# Patient Record
Sex: Female | Born: 1950 | Race: Black or African American | Hispanic: No | Marital: Single | Smoking: Current some day smoker
Health system: Southern US, Community
[De-identification: ages and names within clinical notes are randomized; demographics above are authoritative.]

## PROBLEM LIST (undated history)

## (undated) DIAGNOSIS — E785 Hyperlipidemia, unspecified: Secondary | ICD-10-CM

## (undated) DIAGNOSIS — M545 Low back pain, unspecified: Secondary | ICD-10-CM

## (undated) DIAGNOSIS — N289 Disorder of kidney and ureter, unspecified: Secondary | ICD-10-CM

## (undated) DIAGNOSIS — J449 Chronic obstructive pulmonary disease, unspecified: Secondary | ICD-10-CM

## (undated) DIAGNOSIS — Z85038 Personal history of other malignant neoplasm of large intestine: Secondary | ICD-10-CM

## (undated) DIAGNOSIS — I255 Ischemic cardiomyopathy: Secondary | ICD-10-CM

## (undated) DIAGNOSIS — D649 Anemia, unspecified: Secondary | ICD-10-CM

## (undated) DIAGNOSIS — I10 Essential (primary) hypertension: Secondary | ICD-10-CM

## (undated) DIAGNOSIS — E669 Obesity, unspecified: Secondary | ICD-10-CM

## (undated) DIAGNOSIS — I214 Non-ST elevation (NSTEMI) myocardial infarction: Secondary | ICD-10-CM

## (undated) HISTORY — DX: Disorder of kidney and ureter, unspecified: N28.9

## (undated) HISTORY — DX: Essential (primary) hypertension: I10

## (undated) HISTORY — DX: Chronic obstructive pulmonary disease, unspecified: J44.9

## (undated) HISTORY — PX: COLECTOMY: SHX59

## (undated) HISTORY — DX: Personal history of other malignant neoplasm of large intestine: Z85.038

## (undated) HISTORY — DX: Hyperlipidemia, unspecified: E78.5

## (undated) HISTORY — DX: Low back pain: M54.5

## (undated) HISTORY — DX: Low back pain, unspecified: M54.50

## (undated) HISTORY — DX: Obesity, unspecified: E66.9

## (undated) HISTORY — DX: Anemia, unspecified: D64.9

---

## 1998-11-11 ENCOUNTER — Other Ambulatory Visit: Admission: RE | Admit: 1998-11-11 | Discharge: 1998-11-11 | Payer: Self-pay | Admitting: Internal Medicine

## 1999-10-06 ENCOUNTER — Other Ambulatory Visit: Admission: RE | Admit: 1999-10-06 | Discharge: 1999-10-06 | Payer: Self-pay | Admitting: Internal Medicine

## 2004-05-26 ENCOUNTER — Ambulatory Visit: Payer: Self-pay | Admitting: Internal Medicine

## 2004-08-18 ENCOUNTER — Ambulatory Visit: Payer: Self-pay | Admitting: Internal Medicine

## 2004-08-26 ENCOUNTER — Ambulatory Visit: Payer: Self-pay | Admitting: Internal Medicine

## 2004-11-17 ENCOUNTER — Ambulatory Visit: Payer: Self-pay | Admitting: Internal Medicine

## 2004-11-24 ENCOUNTER — Ambulatory Visit: Payer: Self-pay | Admitting: Internal Medicine

## 2005-02-25 ENCOUNTER — Ambulatory Visit: Payer: Self-pay | Admitting: Internal Medicine

## 2005-03-04 ENCOUNTER — Ambulatory Visit: Payer: Self-pay | Admitting: Internal Medicine

## 2005-05-05 ENCOUNTER — Ambulatory Visit: Payer: Self-pay | Admitting: Internal Medicine

## 2005-06-04 ENCOUNTER — Ambulatory Visit: Payer: Self-pay | Admitting: Internal Medicine

## 2007-10-13 ENCOUNTER — Ambulatory Visit: Payer: Self-pay | Admitting: Internal Medicine

## 2007-10-13 DIAGNOSIS — I10 Essential (primary) hypertension: Secondary | ICD-10-CM | POA: Insufficient documentation

## 2007-10-13 DIAGNOSIS — F17200 Nicotine dependence, unspecified, uncomplicated: Secondary | ICD-10-CM | POA: Insufficient documentation

## 2007-10-13 DIAGNOSIS — E1159 Type 2 diabetes mellitus with other circulatory complications: Secondary | ICD-10-CM | POA: Insufficient documentation

## 2007-10-13 DIAGNOSIS — R634 Abnormal weight loss: Secondary | ICD-10-CM | POA: Insufficient documentation

## 2007-10-13 DIAGNOSIS — Z85038 Personal history of other malignant neoplasm of large intestine: Secondary | ICD-10-CM | POA: Insufficient documentation

## 2007-10-13 DIAGNOSIS — D509 Iron deficiency anemia, unspecified: Secondary | ICD-10-CM | POA: Insufficient documentation

## 2007-10-17 LAB — CONVERTED CEMR LAB
ALT: 16 units/L (ref 0–35)
AST: 14 units/L (ref 0–37)
Albumin: 3.8 g/dL (ref 3.5–5.2)
Alkaline Phosphatase: 155 units/L — ABNORMAL HIGH (ref 39–117)
BUN: 11 mg/dL (ref 6–23)
Basophils Absolute: 0.1 10*3/uL (ref 0.0–0.1)
Basophils Relative: 1.6 % — ABNORMAL HIGH (ref 0.0–1.0)
Bilirubin Urine: NEGATIVE
Bilirubin, Direct: 0.1 mg/dL (ref 0.0–0.3)
CEA: 4.5 ng/mL (ref 0.0–5.0)
CO2: 30 meq/L (ref 19–32)
Calcium: 9.2 mg/dL (ref 8.4–10.5)
Chloride: 101 meq/L (ref 96–112)
Cholesterol: 185 mg/dL (ref 0–200)
Creatinine, Ser: 0.5 mg/dL (ref 0.4–1.2)
Creatinine,U: 84.2 mg/dL
Eosinophils Absolute: 0.1 10*3/uL (ref 0.0–0.7)
Eosinophils Relative: 1.9 % (ref 0.0–5.0)
GFR calc Af Amer: 164 mL/min
GFR calc non Af Amer: 135 mL/min
Glucose, Bld: 356 mg/dL — ABNORMAL HIGH (ref 70–99)
HCT: 43 % (ref 36.0–46.0)
HDL: 53.4 mg/dL (ref >39.0)
Hemoglobin: 14 g/dL (ref 12.0–15.0)
Hgb A1c MFr Bld: 14.7 % — ABNORMAL HIGH (ref 4.6–6.0)
Iron: 57 ug/dL (ref 42–145)
Ketones, ur: NEGATIVE mg/dL
LDL Cholesterol: 119 mg/dL — ABNORMAL HIGH (ref 0–99)
Leukocytes, UA: NEGATIVE
Lymphocytes Relative: 37.1 % (ref 12.0–46.0)
MCHC: 32.6 g/dL (ref 30.0–36.0)
MCV: 85.7 fL (ref 78.0–100.0)
Microalb Creat Ratio: 165.1 mg/g — ABNORMAL HIGH (ref 0.0–30.0)
Microalb, Ur: 13.9 mg/dL — ABNORMAL HIGH (ref 0.0–1.9)
Monocytes Absolute: 0.3 10*3/uL (ref 0.1–1.0)
Monocytes Relative: 8.6 % (ref 3.0–12.0)
Neutro Abs: 1.9 10*3/uL (ref 1.4–7.7)
Neutrophils Relative %: 50.8 % (ref 43.0–77.0)
Nitrite: NEGATIVE
Platelets: 134 10*3/uL — ABNORMAL LOW (ref 150–400)
Potassium: 3.9 meq/L (ref 3.5–5.1)
RBC: 5.02 M/uL (ref 3.87–5.11)
RDW: 13.7 % (ref 11.5–14.6)
Saturation Ratios: 16.5 % — ABNORMAL LOW (ref 20.0–50.0)
Sodium: 137 meq/L (ref 135–145)
Specific Gravity, Urine: 1.025 (ref 1.000–1.03)
TSH: 1.91 microintl units/mL (ref 0.35–5.50)
Total Bilirubin: 0.7 mg/dL (ref 0.3–1.2)
Total CHOL/HDL Ratio: 3.5
Total Protein: 6.9 g/dL (ref 6.0–8.3)
Transferrin: 247.2 mg/dL (ref >=212.0)
Triglycerides: 62 mg/dL (ref 0–149)
Urine Glucose: >=1000 mg/dL — CR
Urobilinogen, UA: 0.2 (ref 0.0–1.0)
VLDL: 12 mg/dL (ref 0–40)
Vitamin B-12: 701 pg/mL (ref 211–911)
WBC: 3.8 10*3/uL — ABNORMAL LOW (ref 4.5–10.5)
pH: 5.5 (ref 5.0–8.0)

## 2008-01-31 ENCOUNTER — Ambulatory Visit: Payer: Self-pay | Admitting: Internal Medicine

## 2008-02-01 LAB — CONVERTED CEMR LAB
BUN: 14 mg/dL (ref 6–23)
CO2: 28 meq/L (ref 19–32)
Calcium: 9.3 mg/dL (ref 8.4–10.5)
Chloride: 102 meq/L (ref 96–112)
Creatinine, Ser: 0.6 mg/dL (ref 0.4–1.2)
GFR calc Af Amer: 133 mL/min
GFR calc non Af Amer: 110 mL/min
Glucose, Bld: 346 mg/dL — ABNORMAL HIGH (ref 70–99)
Hgb A1c MFr Bld: 12.2 % — ABNORMAL HIGH (ref 4.6–6.0)
Potassium: 3.8 meq/L (ref 3.5–5.1)
Sodium: 137 meq/L (ref 135–145)

## 2008-02-09 ENCOUNTER — Ambulatory Visit: Payer: Self-pay | Admitting: Internal Medicine

## 2008-02-09 DIAGNOSIS — J449 Chronic obstructive pulmonary disease, unspecified: Secondary | ICD-10-CM | POA: Insufficient documentation

## 2008-06-06 ENCOUNTER — Ambulatory Visit: Payer: Self-pay | Admitting: Internal Medicine

## 2008-06-06 LAB — CONVERTED CEMR LAB
BUN: 16 mg/dL (ref 6–23)
CO2: 27 meq/L (ref 19–32)
Calcium: 9.3 mg/dL (ref 8.4–10.5)
Chloride: 104 meq/L (ref 96–112)
Creatinine, Ser: 0.6 mg/dL (ref 0.4–1.2)
GFR calc Af Amer: 133 mL/min
GFR calc non Af Amer: 110 mL/min
Glucose, Bld: 270 mg/dL — ABNORMAL HIGH (ref 70–99)
Hgb A1c MFr Bld: 10.7 % — ABNORMAL HIGH (ref 4.6–6.0)
Potassium: 3.7 meq/L (ref 3.5–5.1)
Sodium: 138 meq/L (ref 135–145)
TSH: 2.1 microintl units/mL (ref 0.35–5.50)

## 2008-06-13 ENCOUNTER — Ambulatory Visit: Payer: Self-pay | Admitting: Internal Medicine

## 2008-06-13 DIAGNOSIS — J019 Acute sinusitis, unspecified: Secondary | ICD-10-CM | POA: Insufficient documentation

## 2008-09-06 ENCOUNTER — Ambulatory Visit: Payer: Self-pay | Admitting: Internal Medicine

## 2008-09-06 LAB — CONVERTED CEMR LAB
ALT: 16 units/L (ref 0–35)
AST: 13 units/L (ref 0–37)
Albumin: 3.9 g/dL (ref 3.5–5.2)
Alkaline Phosphatase: 75 units/L (ref 39–117)
BUN: 20 mg/dL (ref 6–23)
Basophils Absolute: 0.1 10*3/uL (ref 0.0–0.1)
Basophils Relative: 0.8 % (ref 0.0–3.0)
Bilirubin, Direct: 0.1 mg/dL (ref 0.0–0.3)
CO2: 28 meq/L (ref 19–32)
Calcium: 9.8 mg/dL (ref 8.4–10.5)
Chloride: 103 meq/L (ref 96–112)
Cholesterol: 119 mg/dL (ref 0–200)
Creatinine, Ser: 0.6 mg/dL (ref 0.4–1.2)
Eosinophils Absolute: 0.2 10*3/uL (ref 0.0–0.7)
Eosinophils Relative: 3.2 % (ref 0.0–5.0)
GFR calc Af Amer: 133 mL/min
GFR calc non Af Amer: 110 mL/min
Glucose, Bld: 232 mg/dL — ABNORMAL HIGH (ref 70–99)
HCT: 37 % (ref 36.0–46.0)
HDL: 36 mg/dL — ABNORMAL LOW (ref >39.0)
Hemoglobin: 12.4 g/dL (ref 12.0–15.0)
Hgb A1c MFr Bld: 11.4 % — ABNORMAL HIGH (ref 4.6–6.0)
LDL Cholesterol: 70 mg/dL (ref 0–99)
Lymphocytes Relative: 32.2 % (ref 12.0–46.0)
MCHC: 33.6 g/dL (ref 30.0–36.0)
MCV: 86.5 fL (ref 78.0–100.0)
Monocytes Absolute: 0.5 10*3/uL (ref 0.1–1.0)
Monocytes Relative: 7.9 % (ref 3.0–12.0)
Neutro Abs: 3.5 10*3/uL (ref 1.4–7.7)
Neutrophils Relative %: 55.9 % (ref 43.0–77.0)
Platelets: 141 10*3/uL — ABNORMAL LOW (ref 150–400)
Potassium: 3.7 meq/L (ref 3.5–5.1)
RBC: 4.27 M/uL (ref 3.87–5.11)
RDW: 13.2 % (ref 11.5–14.6)
Sodium: 138 meq/L (ref 135–145)
Total Bilirubin: 0.8 mg/dL (ref 0.3–1.2)
Total CHOL/HDL Ratio: 3.3
Total Protein: 6.6 g/dL (ref 6.0–8.3)
Triglycerides: 67 mg/dL (ref 0–149)
VLDL: 13 mg/dL (ref 0–40)
WBC: 6.4 10*3/uL (ref 4.5–10.5)

## 2008-09-11 ENCOUNTER — Ambulatory Visit: Payer: Self-pay | Admitting: Internal Medicine

## 2008-09-11 DIAGNOSIS — M79609 Pain in unspecified limb: Secondary | ICD-10-CM | POA: Insufficient documentation

## 2008-10-29 ENCOUNTER — Telehealth: Payer: Self-pay | Admitting: Internal Medicine

## 2008-12-13 ENCOUNTER — Ambulatory Visit: Payer: Self-pay | Admitting: Internal Medicine

## 2008-12-15 LAB — CONVERTED CEMR LAB
BUN: 24 mg/dL — ABNORMAL HIGH (ref 6–23)
CO2: 24 meq/L (ref 19–32)
Calcium: 9.6 mg/dL (ref 8.4–10.5)
Chloride: 108 meq/L (ref 96–112)
Creatinine, Ser: 1.4 mg/dL — ABNORMAL HIGH (ref 0.4–1.2)
GFR calc non Af Amer: 41.02 mL/min (ref >60)
Glucose, Bld: 100 mg/dL — ABNORMAL HIGH (ref 70–99)
Hgb A1c MFr Bld: 7.9 % — ABNORMAL HIGH (ref 4.6–6.5)
Potassium: 4 meq/L (ref 3.5–5.1)
Sodium: 140 meq/L (ref 135–145)
Total CK: 132 units/L (ref 7–177)
Vitamin B-12: 434 pg/mL (ref 211–911)

## 2008-12-19 ENCOUNTER — Ambulatory Visit: Payer: Self-pay | Admitting: Internal Medicine

## 2008-12-19 DIAGNOSIS — N259 Disorder resulting from impaired renal tubular function, unspecified: Secondary | ICD-10-CM | POA: Insufficient documentation

## 2009-04-15 ENCOUNTER — Ambulatory Visit: Payer: Self-pay | Admitting: Internal Medicine

## 2009-04-17 LAB — CONVERTED CEMR LAB
ALT: 16 units/L (ref 0–35)
AST: 15 units/L (ref 0–37)
Albumin: 3.9 g/dL (ref 3.5–5.2)
Alkaline Phosphatase: 86 units/L (ref 39–117)
BUN: 24 mg/dL — ABNORMAL HIGH (ref 6–23)
Basophils Absolute: 0.2 10*3/uL — ABNORMAL HIGH (ref 0.0–0.1)
Basophils Relative: 3.4 % — ABNORMAL HIGH (ref 0.0–3.0)
Bilirubin, Direct: 0 mg/dL (ref 0.0–0.3)
CEA: 2.3 ng/mL (ref 0.0–5.0)
CO2: 27 meq/L (ref 19–32)
Calcium: 9.4 mg/dL (ref 8.4–10.5)
Chloride: 104 meq/L (ref 96–112)
Cholesterol: 112 mg/dL (ref 0–200)
Creatinine, Ser: 0.9 mg/dL (ref 0.4–1.2)
Eosinophils Absolute: 0.1 10*3/uL (ref 0.0–0.7)
Eosinophils Relative: 1.6 % (ref 0.0–5.0)
GFR calc non Af Amer: 68.23 mL/min (ref >60)
Glucose, Bld: 102 mg/dL — ABNORMAL HIGH (ref 70–99)
HCT: 33.2 % — ABNORMAL LOW (ref 36.0–46.0)
HDL: 43.2 mg/dL (ref >39.00)
Hemoglobin: 11.2 g/dL — ABNORMAL LOW (ref 12.0–15.0)
Hgb A1c MFr Bld: 8 % — ABNORMAL HIGH (ref 4.6–6.5)
LDL Cholesterol: 59 mg/dL (ref 0–99)
Lymphocytes Relative: 24.2 % (ref 12.0–46.0)
Lymphs Abs: 1.6 10*3/uL (ref 0.7–4.0)
MCHC: 33.9 g/dL (ref 30.0–36.0)
MCV: 86.8 fL (ref 78.0–100.0)
Monocytes Absolute: 0.3 10*3/uL (ref 0.1–1.0)
Monocytes Relative: 4.3 % (ref 3.0–12.0)
Neutro Abs: 4.3 10*3/uL (ref 1.4–7.7)
Neutrophils Relative %: 66.5 % (ref 43.0–77.0)
Platelets: 144 10*3/uL — ABNORMAL LOW (ref 150.0–400.0)
Potassium: 3.8 meq/L (ref 3.5–5.1)
RBC: 3.82 M/uL — ABNORMAL LOW (ref 3.87–5.11)
RDW: 13.3 % (ref 11.5–14.6)
Sodium: 138 meq/L (ref 135–145)
TSH: 3.26 microintl units/mL (ref 0.35–5.50)
Total Bilirubin: 0.4 mg/dL (ref 0.3–1.2)
Total CHOL/HDL Ratio: 3
Total Protein: 6.7 g/dL (ref 6.0–8.3)
Triglycerides: 49 mg/dL (ref 0.0–149.0)
VLDL: 9.8 mg/dL (ref 0.0–40.0)
Vitamin B-12: 344 pg/mL (ref 211–911)
WBC: 6.5 10*3/uL (ref 4.5–10.5)

## 2009-04-18 ENCOUNTER — Ambulatory Visit: Payer: Self-pay | Admitting: Internal Medicine

## 2009-08-22 ENCOUNTER — Ambulatory Visit: Payer: Self-pay | Admitting: Internal Medicine

## 2009-08-23 LAB — CONVERTED CEMR LAB
BUN: 24 mg/dL — ABNORMAL HIGH (ref 6–23)
CO2: 25 meq/L (ref 19–32)
Calcium: 9.1 mg/dL (ref 8.4–10.5)
Chloride: 108 meq/L (ref 96–112)
Creatinine, Ser: 1 mg/dL (ref 0.4–1.2)
GFR calc non Af Amer: 60.34 mL/min (ref >60)
Glucose, Bld: 125 mg/dL — ABNORMAL HIGH (ref 70–99)
Hgb A1c MFr Bld: 8.2 % — ABNORMAL HIGH (ref 4.6–6.5)
Potassium: 4 meq/L (ref 3.5–5.1)
Sodium: 139 meq/L (ref 135–145)

## 2009-08-26 ENCOUNTER — Ambulatory Visit: Payer: Self-pay | Admitting: Internal Medicine

## 2009-12-31 ENCOUNTER — Ambulatory Visit: Payer: Self-pay | Admitting: Internal Medicine

## 2009-12-31 LAB — CONVERTED CEMR LAB
ALT: 15 units/L (ref 0–35)
AST: 12 units/L (ref 0–37)
Albumin: 3.9 g/dL (ref 3.5–5.2)
Alkaline Phosphatase: 96 units/L (ref 39–117)
BUN: 24 mg/dL — ABNORMAL HIGH (ref 6–23)
Basophils Absolute: 0 10*3/uL (ref 0.0–0.1)
Basophils Relative: 0.5 % (ref 0.0–3.0)
Bilirubin Urine: NEGATIVE
Bilirubin, Direct: 0.1 mg/dL (ref 0.0–0.3)
CEA: 2.5 ng/mL (ref 0.0–5.0)
CO2: 25 meq/L (ref 19–32)
Calcium: 9.3 mg/dL (ref 8.4–10.5)
Chloride: 109 meq/L (ref 96–112)
Cholesterol: 157 mg/dL (ref 0–200)
Creatinine, Ser: 0.9 mg/dL (ref 0.4–1.2)
Eosinophils Absolute: 0.1 10*3/uL (ref 0.0–0.7)
Eosinophils Relative: 1.7 % (ref 0.0–5.0)
GFR calc non Af Amer: 65.53 mL/min (ref >60)
Glucose, Bld: 118 mg/dL — ABNORMAL HIGH (ref 70–99)
HCT: 32.1 % — ABNORMAL LOW (ref 36.0–46.0)
HDL: 44.7 mg/dL (ref >39.00)
Hemoglobin: 10.8 g/dL — ABNORMAL LOW (ref 12.0–15.0)
Hgb A1c MFr Bld: 8.5 % — ABNORMAL HIGH (ref 4.6–6.5)
Ketones, ur: NEGATIVE mg/dL
LDL Cholesterol: 95 mg/dL (ref 0–99)
Leukocytes, UA: NEGATIVE
Lymphocytes Relative: 30.9 % (ref 12.0–46.0)
Lymphs Abs: 1.9 10*3/uL (ref 0.7–4.0)
MCHC: 33.7 g/dL (ref 30.0–36.0)
MCV: 86.2 fL (ref 78.0–100.0)
Monocytes Absolute: 0.6 10*3/uL (ref 0.1–1.0)
Monocytes Relative: 10.3 % (ref 3.0–12.0)
Neutro Abs: 3.5 10*3/uL (ref 1.4–7.7)
Neutrophils Relative %: 56.6 % (ref 43.0–77.0)
Nitrite: NEGATIVE
Platelets: 148 10*3/uL — ABNORMAL LOW (ref 150.0–400.0)
Potassium: 4.2 meq/L (ref 3.5–5.1)
RBC: 3.72 M/uL — ABNORMAL LOW (ref 3.87–5.11)
RDW: 14.3 % (ref 11.5–14.6)
Sodium: 140 meq/L (ref 135–145)
Specific Gravity, Urine: 1.015 (ref 1.000–1.030)
TSH: 2.74 microintl units/mL (ref 0.35–5.50)
Total Bilirubin: 0.4 mg/dL (ref 0.3–1.2)
Total CHOL/HDL Ratio: 4
Total Protein, Urine: NEGATIVE mg/dL
Total Protein: 6.9 g/dL (ref 6.0–8.3)
Triglycerides: 89 mg/dL (ref 0.0–149.0)
Urine Glucose: NEGATIVE mg/dL
Urobilinogen, UA: 0.2 (ref 0.0–1.0)
VLDL: 17.8 mg/dL (ref 0.0–40.0)
Vitamin B-12: 330 pg/mL (ref 211–911)
WBC: 6.2 10*3/uL (ref 4.5–10.5)
pH: 5.5 (ref 5.0–8.0)

## 2010-01-07 ENCOUNTER — Ambulatory Visit: Payer: Self-pay | Admitting: Internal Medicine

## 2010-01-08 ENCOUNTER — Telehealth: Payer: Self-pay | Admitting: Internal Medicine

## 2010-01-16 ENCOUNTER — Encounter (INDEPENDENT_AMBULATORY_CARE_PROVIDER_SITE_OTHER): Payer: Self-pay | Admitting: *Deleted

## 2010-05-06 ENCOUNTER — Ambulatory Visit: Payer: Self-pay | Admitting: Internal Medicine

## 2010-05-06 LAB — CONVERTED CEMR LAB: Vit D, 25-Hydroxy: 33 ng/mL (ref 30–89)

## 2010-05-07 LAB — CONVERTED CEMR LAB
ALT: 14 units/L (ref 0–35)
AST: 14 units/L (ref 0–37)
Albumin: 3.6 g/dL (ref 3.5–5.2)
Alkaline Phosphatase: 95 units/L (ref 39–117)
BUN: 18 mg/dL (ref 6–23)
Basophils Absolute: 0 10*3/uL (ref 0.0–0.1)
Basophils Relative: 0.3 % (ref 0.0–3.0)
Bilirubin, Direct: 0.1 mg/dL (ref 0.0–0.3)
CO2: 25 meq/L (ref 19–32)
Calcium: 9.3 mg/dL (ref 8.4–10.5)
Chloride: 108 meq/L (ref 96–112)
Cholesterol: 154 mg/dL (ref 0–200)
Creatinine, Ser: 0.8 mg/dL (ref 0.4–1.2)
Eosinophils Absolute: 0.1 10*3/uL (ref 0.0–0.7)
Eosinophils Relative: 1.5 % (ref 0.0–5.0)
GFR calc non Af Amer: 76.77 mL/min (ref >60)
Glucose, Bld: 119 mg/dL — ABNORMAL HIGH (ref 70–99)
HCT: 30.8 % — ABNORMAL LOW (ref 36.0–46.0)
HDL: 45.9 mg/dL (ref >39.00)
Hemoglobin: 10.4 g/dL — ABNORMAL LOW (ref 12.0–15.0)
Hgb A1c MFr Bld: 8.2 % — ABNORMAL HIGH (ref 4.6–6.5)
Iron: 34 ug/dL — ABNORMAL LOW (ref 42–145)
LDL Cholesterol: 97 mg/dL (ref 0–99)
Lymphocytes Relative: 21 % (ref 12.0–46.0)
Lymphs Abs: 1.1 10*3/uL (ref 0.7–4.0)
MCHC: 33.8 g/dL (ref 30.0–36.0)
MCV: 85.4 fL (ref 78.0–100.0)
Monocytes Absolute: 0.5 10*3/uL (ref 0.1–1.0)
Monocytes Relative: 9.2 % (ref 3.0–12.0)
Neutro Abs: 3.5 10*3/uL (ref 1.4–7.7)
Neutrophils Relative %: 68 % (ref 43.0–77.0)
Platelets: 137 10*3/uL — ABNORMAL LOW (ref 150.0–400.0)
Potassium: 3.8 meq/L (ref 3.5–5.1)
RBC: 3.61 M/uL — ABNORMAL LOW (ref 3.87–5.11)
RDW: 15.1 % — ABNORMAL HIGH (ref 11.5–14.6)
Saturation Ratios: 9.1 % — ABNORMAL LOW (ref 20.0–50.0)
Sodium: 140 meq/L (ref 135–145)
Total Bilirubin: 0.7 mg/dL (ref 0.3–1.2)
Total CHOL/HDL Ratio: 3
Total Protein: 6.4 g/dL (ref 6.0–8.3)
Transferrin: 265.7 mg/dL (ref 212.0–360.0)
Triglycerides: 57 mg/dL (ref 0.0–149.0)
VLDL: 11.4 mg/dL (ref 0.0–40.0)
Vitamin B-12: 775 pg/mL (ref 211–911)
WBC: 5.2 10*3/uL (ref 4.5–10.5)

## 2010-05-21 ENCOUNTER — Ambulatory Visit: Payer: Self-pay | Admitting: Internal Medicine

## 2010-07-19 DIAGNOSIS — E785 Hyperlipidemia, unspecified: Secondary | ICD-10-CM | POA: Insufficient documentation

## 2010-07-19 DIAGNOSIS — E669 Obesity, unspecified: Secondary | ICD-10-CM | POA: Insufficient documentation

## 2010-07-19 DIAGNOSIS — Z85038 Personal history of other malignant neoplasm of large intestine: Secondary | ICD-10-CM | POA: Insufficient documentation

## 2010-08-07 NOTE — Assessment & Plan Note (Signed)
Summary: 4 mos well with labs/#/cd   Vital Signs:  Patient profile:   60 year old female Height:      62 inches (157.48 cm) Weight:      224 pounds (101.82 kg) BMI:     41.12 Pulse rate:   72 / minute Pulse rhythm:   regular Resp:     16 per minute BP sitting:   120 / 80  (left arm) Cuff size:   large  Vitals Entered By: Jonathon Resides, CMA(AAMA) (January 07, 2010 8:33 AM) CC: cpx   CC:  cpx.  History of Present Illness: The patient presents for a follow up of hypertension, diabetes, hyperlipidemia The patient presents for a wellness examination    Current Medications (verified): 1)  Amaryl 4 Mg  Tabs (Glimepiride) .... Once Daily 2)  Lasix 80 Mg  Tabs (Furosemide) .... 1/2 Once Daily 3)  Simvastatin 20 Mg Tabs (Simvastatin) .[name].. 1 By Mouth At Bedtime 4)  Vitamin D3 1000 Unit  Tabs (Cholecalciferol) .[name].. 1 By Mouth Daily 5)  Spironolactone 50 Mg  Tabs (Spironolactone) .[name].. 1 By Mouth Qd 6)  Proair Hfa 108 (90 Base) Mcg/act  Aers (Albuterol Sulfate) .... 2 Inh Q4h As Needed Shortness of Breath 7)  Exforge 5-320 Mg  Tabs (Amlodipine Besylate-Valsartan) .[name].. 1 By Mouth Qd 8)  Janumet 50-1000 Mg Tabs (Sitagliptin-Metformin Hcl) .[name].. 1 By Mouth Bid 9)  Nexium 40 Mg Cpdr (Esomeprazole Magnesium) .... Take 1 Tab Each Morning 10)  Maxichlor Peh 4-10 Mg Tabs (Chlorpheniramine-Phenylephrine) .[name].. 1 By Mouth Two Times A Day Prn  Allergies (verified): 1)  ! Darvocet A500 2)  ! Lipitor (Atorvastatin Calcium) 3)  Hydrochlorothiazide 4)  Enalapril Maleate 5)  Zocor  Past History:  Past Medical History: Last updated: 12/19/2008 Anemia-iron deficiency Colon cancer, hx of Diabetes mellitus, type II Hyperlipidemia Hypertension Low back pain Obesity COPD Renal insufficiency  Past Surgical History: Last updated: 10/13/2007 Colectomy  Family History: Last updated: 10/13/2007 Family History Hypertension  Social History: Last updated: 01/07/2010 Occupation: CNA working a  lot Single widow - has a friend Current Smoker  Social History: Occupation: CNA working a lot Single widow - has a friend Current Smoker  Review of Systems       The patient complains of difficulty walking.  The patient denies weight loss, weight gain, dyspnea on exertion, abdominal pain, melena, hematochezia, severe indigestion/heartburn, anorexia, fever, vision loss, decreased hearing, hoarseness, chest pain, syncope, peripheral edema, prolonged cough, headaches, hemoptysis, hematuria, incontinence, genital sores, muscle weakness, suspicious skin lesions, transient blindness, depression, unusual weight change, abnormal bleeding, enlarged lymph nodes, angioedema, and breast masses.         LBP Lower extr swelling  Physical Exam  General:  overweight-appearing.   Head:  Normocephalic and atraumatic without obvious abnormalities. No apparent alopecia or balding. Eyes:  No corneal or conjunctival inflammation noted. EOMI. Perrla. Ears:  External ear exam shows no significant lesions or deformities.  Otoscopic examination reveals clear canals, tympanic membranes are intact bilaterally without bulging, retraction, inflammation or discharge. Hearing is grossly normal bilaterally. Nose:  Swollen mucosa Mouth:  Moist Neck:  No deformities, masses, or tenderness noted. Chest Wall:  No deformities, masses, or tenderness noted. Lungs:  Normal respiratory effort, chest expands symmetrically. Lungs are clear to auscultation, no crackles or wheezes. Heart:  Normal rate and regular rhythm. S1 and S2 normal without gallop, murmur, click, rub or other extra sounds. Abdomen:  Bowel sounds positive,abdomen soft and non-tender without masses, organomegaly or  hernias noted. Msk:  w/o OA deformities Extremities:  trace left pedal edema.   Neurologic:  No cranial nerve deficits noted. Station and gait are normal. Plantar reflexes are down-going bilaterally. DTRs are symmetrical throughout. Sensory, motor  and coordinative functions appear intact. Skin:  Intact without suspicious lesions or rashes Cervical Nodes:  No lymphadenopathy noted Inguinal Nodes:  No significant adenopathy Psych:  Cognition and judgment appear intact. Alert and cooperative with normal attention span and concentration. No apparent delusions, illusions, hallucinations   Impression & Recommendations:  Problem # 1:  PHYSICAL EXAMINATION (ICD-V70.0) Assessment New The labs were reviewed with the patient.  Health and age related issues were discussed. Available screening tests and vaccinations were discussed as well. Healthy life style including good diet and execise was discussed.  Orders: EKG w/ Interpretation (93000)  Problem # 2:  RENAL INSUFFICIENCY (ICD-588.9) Assessment: New The labs were reviewed with the patient.   Problem # 3:  HYPERTENSION (ICD-401.9) Assessment: Unchanged  Her updated medication list for this problem includes:    Lasix 80 Mg Tabs (Furosemide) .[name]... 1/2 once daily prn    Spironolactone 50 Mg Tabs (Spironolactone) .[name]... 1 by mouth qd    Exforge 5-320 Mg Tabs (Amlodipine besylate-valsartan) .[name]... 1 by mouth qd  BP today: 120/80 Prior BP: 126/80 (08/26/2009)  Labs Reviewed: K+: 4.2 (12/31/2009) Creat: : 0.9 (12/31/2009)   Chol: 157 (12/31/2009)   HDL: 44.70 (12/31/2009)   LDL: 95 (12/31/2009)   TG: 89.0 (12/31/2009)  Problem # 4:  DIABETES MELLITUS, TYPE II (ICD-250.00) Assessment: Unchanged  Her updated medication list for this problem includes:    Amaryl 4 Mg Tabs (Glimepiride) ..... Once daily    Exforge 5-320 Mg Tabs (Amlodipine besylate-valsartan) .[name]... 1 by mouth qd    Janumet 50-1000 Mg Tabs (Sitagliptin-metformin hcl) .[name]... 1 by mouth bid  Labs Reviewed: Creat: 0.9 (12/31/2009)    Reviewed HgBA1c results: 8.5 (12/31/2009)  8.2 (08/22/2009)  Problem # 5:  HYPERLIPIDEMIA (ICD-272.4)  The following medications were removed from the medication list:    Simvastatin 20 Mg  Tabs (Simvastatin) .[name]... 1 by mouth at bedtime  Problem # 6:  ANEMIA-IRON DEFICIENCY (ICD-280.9) Assessment: Deteriorated  Her updated medication list for this problem includes:    Vitamin B-12 500 Mcg Tabs (Cyanocobalamin) .[name]... 1 by mouth once daily for vitamin b12 deficiency    Ferro-bob 325 (65 Fe) Mg Tabs (Ferrous sulfate) .[name]... 1 by mouth once daily for anemia  Problem # 7:  COLON CANCER, HX OF (ICD-V10.05) Assessment: Comment Only  Will check w/Dr Henrene Pastor re colon due or not. She missed two colonoscopies  Orders: Gastroenterology Referral (GI)  Complete Medication List: 1)  Amaryl 4 Mg Tabs (Glimepiride) .... Once daily 2)  Lasix 80 Mg Tabs (Furosemide) .... 1/2 once daily prn 3)  Vitamin D3 1000 Unit Tabs (Cholecalciferol) .[name].. 1 by mouth daily 4)  Spironolactone 50 Mg Tabs (Spironolactone) .[name].. 1 by mouth qd 5)  Proair Hfa 108 (90 Base) Mcg/act Aers (Albuterol sulfate) .... 2 inh q4h as needed shortness of breath 6)  Exforge 5-320 Mg Tabs (Amlodipine besylate-valsartan) .[name].. 1 by mouth qd 7)  Janumet 50-1000 Mg Tabs (Sitagliptin-metformin hcl) .[name].. 1 by mouth bid 8)  Nexium 40 Mg Cpdr (Esomeprazole magnesium) .... Take 1 tab each morning 9)  Maxichlor Peh 4-10 Mg Tabs (Chlorpheniramine-phenylephrine) .[name].. 1 by mouth two times a day prn 10)  Vitamin B-12 500 Mcg Tabs (Cyanocobalamin) .[name].. 1 by mouth once daily for vitamin b12 deficiency 11)  Ferro-bob 325 (65 Fe) Mg Tabs (Ferrous sulfate) .[name].. 1 by mouth once daily for anemia  Patient Instructions: 1)  Please schedule a follow-up appointment in 4 months. 2)  BMP prior to visit, ICD-9: 3)  Hepatic Panel prior to visit, ICD-9: 4)  Lipid Panel prior to visit, ICD-9: 5)  CBC w/ Diff prior to visit, ICD-9: 6)  HbgA1C prior to visit, ICD-9: 7)  iron and TIBC 8)  Vit B12 and Vit D 268.9 995.20 Prescriptions: NEXIUM 40 MG CPDR (ESOMEPRAZOLE MAGNESIUM) Take 1 tab each morning  #30 x 12   Entered and Authorized by:   [name] MD   Signed by:   [name] MD on 01/07/2010   Method used:   Electronically to        [facility]. [id number]* (retail)       [address].       [place], [place]  [address]       Ph: [phone] or [phone]       Fax: [phone]   RxID:   [id number] JANUMET 50-1000 MG TABS (SITAGLIPTIN-METFORMIN HCL) 1 by mouth bid  #60 Tablet x 12   Entered and Authorized by:   [name] MD   Signed by:   [name] MD on 01/07/2010   Method used:   Electronically to        [facility]. [id number]* (retail)       [place].       [place], [place]  [address]       Ph: [phone] or [phone]       Fax: [phone]   RxID:   [id number] LASIX 80 MG  TABS (FUROSEMIDE) 1/2 once daily  #30 x 12   Entered and Authorized by:   [name] MD   Signed by:   [name] MD on 01/07/2010   Method used:   Electronically to        [facility]. [id number]* (retail)       [place].       [place], [place]  [address]       Ph: [phone] or [phone]       Fax: [phone]   RxID:   [id number] EXFORGE 5-320 MG  TABS (AMLODIPINE BESYLATE-VALSARTAN) 1 by mouth qd  #30 Tablet x 12   Entered and Authorized by:   [name] MD   Signed by:   [name] MD on 01/07/2010   Method used:   Electronically to        [facility]. [id number]* (retail)       [place].       [place], [place]  [address]       Ph: [phone] or [phone]       Fax: [phone]   RxID:   [id number] SPIRONOLACTONE 50 MG  TABS (SPIRONOLACTONE) 1 by mouth qd  #30 Tablet x 12   Entered and Authorized by:   [name] MD   Signed by:   [name] MD on 01/07/2010   Method used:   Electronically to        [facility]. [id number]* (retail)       [place].  [place], [place]  [address]       Ph:  [phone] or [phone]       Fax: [phone]   RxID:   [id number] AMARYL 4 MG  TABS (GLIMEPIRIDE) once daily  #30 Tablet x 12   Entered and Authorized by:   [name] MD   Signed by:   [name] MD on 01/07/2010   Method used:   Electronically to        [facility]. [id number]* (retail)       [place].       [place], [place]  [address]       Ph: [phone] or [phone]       Fax: [phone]   RxID:   [id number] FERRO-BOB 325 (65 FE) MG TABS (FERROUS SULFATE) 1 by mouth once daily for anemia  #30 x 6   Entered and Authorized by:   [name] MD   Signed by:   [name] MD on 01/07/2010   Method used:   Electronically to        [facility]. [id number]* (retail)       [place].       [place], [place]  [address]       Ph: [phone] or [phone]       Fax: [phone]   RxID:   [id number] VITAMIN B-12 500 MCG TABS (CYANOCOBALAMIN) 1 by mouth once daily for Vitamin B12 deficiency  #30 x 12   Entered and Authorized by:   [name] MD   Signed by:   [name] MD on 01/07/2010   Method used:   Electronically to        [facility]. [id number]* (retail)       [place].       [place], [place]  [address]       Ph: [phone] or [phone]       Fax: [phone]   RxID:   [id number]

## 2010-08-07 NOTE — Letter (Signed)
Summary: Referral - not able to see patient  [facility]  [place], [place] [address]   Phone: [phone]  Fax: [phone]    January 16, 2010    Lew Dawes, M.D. [address] [place], [place] [address]   Re:   [name] DOB:  1950-11-10 MRN:   [id number]    Dear Dr. Alain Marion:  Thank you for your kind referral of the above patient.  We have attempted to schedule the recommended procedure Screening Colonoscopy but have not been able to schedule because:  ___ The patient was not available by phone and/or has not returned our calls.   X  The patient declined to schedule the procedure at this time.  We appreciate the referral and hope that we will have the opportunity to treat this patient in the future.    Sincerely,    [facility] [phone]

## 2010-08-07 NOTE — Assessment & Plan Note (Signed)
Summary: 3 mos f/u $50 / cd   Vital Signs:  Patient profile:   60 year old female Height:      62 inches Weight:      216 pounds BMI:     39.65 Temp:     98.5 degrees F oral Pulse rate:   72 / minute BP sitting:   136 / 72  (left arm)  Vitals Entered By: Doralee Albino (December 19, 2008 10:20 AM) CC: f/u Is Patient Diabetic? Yes   CC:  f/u.  History of Present Illness: The patient presents for a follow up of hypertension, diabetes, hyperlipidemia   Current Medications (verified): 1)  Amaryl 4 Mg  Tabs (Glimepiride) .... Once Daily 2)  Lasix 80 Mg  Tabs (Furosemide) .... 1/2 Once Daily 3)  Simvastatin 20 Mg Tabs (Simvastatin) .[name].. 1 By Mouth At Bedtime 4)  Vitamin D3 1000 Unit  Tabs (Cholecalciferol) .[name].. 1 By Mouth Daily 5)  Spironolactone 50 Mg  Tabs (Spironolactone) .[name].. 1 By Mouth Qd 6)  Proair Hfa 108 (90 Base) Mcg/act  Aers (Albuterol Sulfate) .... 2 Inh Q4h As Needed Shortness of Breath 7)  Exforge 5-320 Mg  Tabs (Amlodipine Besylate-Valsartan) .[name].. 1 By Mouth Qd 8)  Janumet 50-1000 Mg Tabs (Sitagliptin-Metformin Hcl) .[name].. 1 By Mouth Bid  Allergies: 1)  ! Darvocet A500 (Propoxyphene N-Apap) 2)  ! Lipitor (Atorvastatin Calcium) 3)  Hydrochlorothiazide 4)  Enalapril Maleate  Past History:  Past Surgical History: Last updated: 10/13/2007 Colectomy  Family History: Last updated: 10/13/2007 Family History Hypertension  Social History: Last updated: 02/09/2008 Occupation: CNA Single widow Current Smoker  Past Medical History: Anemia-iron deficiency Colon cancer, hx of Diabetes mellitus, type II Hyperlipidemia Hypertension Low back pain Obesity COPD Renal insufficiency  Physical Exam  General:  overweight-appearing.   Nose:  Erythematous throat mucosa and intranasal erythema.  Mouth:  Oral mucosa and oropharynx without lesions or exudates.  Dentures. Neck:  No deformities, masses, or tenderness noted. Lungs:  Normal respiratory effort, chest  expands symmetrically. Lungs are clear to auscultation, no crackles or wheezes. Heart:  Normal rate and regular rhythm. S1 and S2 normal without gallop, murmur, click, rub or other extra sounds. Abdomen:  Bowel sounds positive,abdomen soft and non-tender without masses, organomegaly or hernias noted. Msk:  B ancerina tender  Neurologic:  No cranial nerve deficits noted. Station and gait are normal. Plantar reflexes are down-going bilaterally. DTRs are symmetrical throughout. Sensory, motor and coordinative functions appear intact. Skin:  Intact without suspicious lesions or rashes Psych:  Cognition and judgment appear intact. Alert and cooperative with normal attention span and concentration. No apparent delusions, illusions, hallucinations   Impression & Recommendations:  Problem # 1:  WEIGHT LOSS, ABNORMAL (ICD-783.21) on diet Assessment Comment Only  Problem # 2:  LOW BACK PAIN (ICD-724.2) Assessment: Unchanged On prescription therapy  Exercises given  Problem # 3:  HYPERTENSION (ICD-401.9) Assessment: Improved  Her updated medication list for this problem includes:    Lasix 80 Mg Tabs (Furosemide) .[name]... 1/2 once daily    Spironolactone 50 Mg Tabs (Spironolactone) .[name]... 1 by mouth qd    Exforge 5-320 Mg Tabs (Amlodipine besylate-valsartan) .[name]... 1 by mouth qd  Problem # 4:  DIABETES MELLITUS, TYPE II (ICD-250.00) Assessment: Improved  Her updated medication list for this problem includes:    Amaryl 4 Mg Tabs (Glimepiride) ..... Once daily    Exforge 5-320 Mg Tabs (Amlodipine besylate-valsartan) .[name]... 1 by mouth qd    Janumet 50-1000 Mg Tabs (Sitagliptin-metformin hcl) .Marland KitchenMarland KitchenMarland KitchenMarland Kitchen  1 by mouth bid  Labs Reviewed: Creat: 1.4 (12/13/2008)    Reviewed HgBA1c results: 7.9 (12/13/2008)  11.4 (09/06/2008)  Problem # 5:  RENAL INSUFFICIENCY (ICD-588.9) Assessment: Comment Only Repeat labs  Complete Medication List: 1)  Amaryl 4 Mg Tabs (Glimepiride) .... Once daily 2)  Lasix 80 Mg  Tabs (Furosemide) .... 1/2 once daily 3)  Simvastatin 20 Mg Tabs (Simvastatin) .[name].. 1 by mouth at bedtime 4)  Vitamin D3 1000 Unit Tabs (Cholecalciferol) .[name].. 1 by mouth daily 5)  Spironolactone 50 Mg Tabs (Spironolactone) .[name].. 1 by mouth qd 6)  Proair Hfa 108 (90 Base) Mcg/act Aers (Albuterol sulfate) .... 2 inh q4h as needed shortness of breath 7)  Exforge 5-320 Mg Tabs (Amlodipine besylate-valsartan) .[name].. 1 by mouth qd 8)  Janumet 50-1000 Mg Tabs (Sitagliptin-metformin hcl) .[name].. 1 by mouth bid 9)  Nexium 40 Mg Cpdr (Esomeprazole magnesium) .... Take 1 tab each morning  Patient Instructions: 1)  Please schedule a follow-up appointment in 4 months. 2)  BMP prior to visit, ICD-9: 3)  Hepatic Panel prior to visit, ICD-9: 4)  Lipid Panel prior to visit, ICD-9: 5)  TSH prior to visit, ICD-9: 6)  CBC w/ Diff prior to visit, ICD-9: 7)  HbgA1C prior to visit, ICD-9: 250.00 8)  CEA 9)  Vit B12 10)  [email or web address]

## 2010-08-07 NOTE — Assessment & Plan Note (Signed)
Summary: 4 MO ROV /NWS $50   Vital Signs:  Patient Profile:   60 Years Old Female Weight:      220 pounds Temp:     98 degrees F oral Pulse rate:   112 / minute BP sitting:   160 / 94  (left arm)  Vitals Entered By: Doralee Albino (June 13, 2008 8:09 AM)                 Chief Complaint:  Multiple medical problems or concerns.  History of Present Illness: The patient presents for a follow up of hypertension, diabetes, hyperlipidemia C/o L sinus infection x 2 wks    Current Allergies (reviewed today): ! DARVOCET A500 (PROPOXYPHENE N-APAP) ! LIPITOR (ATORVASTATIN CALCIUM) HYDROCHLOROTHIAZIDE ENALAPRIL MALEATE  Past Medical History:    Reviewed history from 02/09/2008 and no changes required:       Anemia-iron deficiency       Colon cancer, hx of       Diabetes mellitus, type II       Hyperlipidemia       Hypertension       Low back pain       Obesity       COPD   Family History:    Reviewed history from 10/13/2007 and no changes required:       Family History Hypertension  Social History:    Reviewed history from 02/09/2008 and no changes required:       Occupation: CNA       Single widow       Current Smoker     Physical Exam  General:     overweight-appearing.   Head:     Normocephalic and atraumatic without obvious abnormalities. No apparent alopecia or balding. Nose:     Erythematous throat mucosa and intranasal erythema.  Mouth:     Oral mucosa and oropharynx without lesions or exudates.  Dentures. Lungs:     Normal respiratory effort, chest expands symmetrically. Lungs are clear to auscultation, no crackles or wheezes. Heart:     Normal rate and regular rhythm. S1 and S2 normal without gallop, murmur, click, rub or other extra sounds. Abdomen:     Bowel sounds positive,abdomen soft and non-tender without masses, organomegaly or hernias noted. Msk:     Lumbar-sacral spine is tender to palpation over paraspinal muscles and painfull with  the ROM  Extremities:     No edema Neurologic:     No cranial nerve deficits noted. Station and gait are normal. Plantar reflexes are down-going bilaterally. DTRs are symmetrical throughout. Sensory, motor and coordinative functions appear intact. Skin:     Intact without suspicious lesions or rashes Psych:     Cognition and judgment appear intact. Alert and cooperative with normal attention span and concentration. No apparent delusions, illusions, hallucinations    Impression & Recommendations:  Problem # 1:  SINUSITIS, ACUTE (ICD-461.9) Assessment: New  Her updated medication list for this problem includes:    Zithromax Z-pak 250 Mg Tabs (Azithromycin) ..... Use as directed   Problem # 2:  HYPERTENSION (ICD-401.9) Assessment: Improved  The following medications were removed from the medication list:    Diovan 320 Mg Tabs (Valsartan) ..... Once daily  Her updated medication list for this problem includes:    Lasix 80 Mg Tabs (Furosemide) .[name]... 1/2 once daily    Spironolactone 50 Mg Tabs (Spironolactone) .[name]... 1 by mouth qd    Exforge 5-320 Mg Tabs (Amlodipine besylate-valsartan) .[name]... 1 by  mouth qd   Problem # 3:  DIABETES MELLITUS, TYPE II (ICD-250.00) Assessment: Improved REfusing insulin The following medications were removed from the medication list:    Diovan 320 Mg Tabs (Valsartan) ..... Once daily  Her updated medication list for this problem includes:    Amaryl 4 Mg Tabs (Glimepiride) ..... Once daily    Metformin Hcl 500 Mg Tabs (Metformin hcl) .[name]... Take 2 tabs twice daily   Problem # 4:  COLON CANCER, HX OF (ICD-V10.05) Assessment: Comment Only Get CBC  Problem # 5:  LOW BACK PAIN (ICD-724.2) Assessment: Unchanged  The following medications were removed from the medication list:    Vicodin 5-500 Mg Tabs (Hydrocodone-acetaminophen) .[name]... As needed    Ibuprofen 600 Mg Tabs (Ibuprofen) .[name]... Three times a day as needed   Complete Medication List: 1)   Amaryl 4 Mg Tabs (Glimepiride) .... Once daily 2)  Lasix 80 Mg Tabs (Furosemide) .... 1/2 once daily 3)  Simvastatin 20 Mg Tabs (Simvastatin) .[name].. 1 by mouth at bedtime 4)  Vitamin D3 1000 Unit Tabs (Cholecalciferol) .[name].. 1 by mouth daily 5)  Spironolactone 50 Mg Tabs (Spironolactone) .[name].. 1 by mouth qd 6)  Metformin Hcl 500 Mg Tabs (Metformin hcl) .... Take 2 tabs twice daily 7)  Proair Hfa 108 (90 Base) Mcg/act Aers (Albuterol sulfate) .... 2 inh q4h as needed shortness of breath 8)  Exforge 5-320 Mg Tabs (Amlodipine besylate-valsartan) .[name].. 1 by mouth qd 9)  Zithromax Z-pak 250 Mg Tabs (Azithromycin) .... Use as directed   Patient Instructions: 1)  Please schedule a follow-up appointment in 3 months. 2)  BMP prior to visit, ICD-9: 3)  Hepatic Panel prior to visit, ICD-9:250.02 4)  Lipid Panel prior to visit, ICD-9: 5)  HbgA1C prior to visit, ICD-9: 6)  CBC w/ Diff prior to visit, ICD-9:   Prescriptions: ZITHROMAX Z-PAK 250 MG  TABS (AZITHROMYCIN) Use as directed  #1 x 0   Entered and Authorized by:   [name] MD   Signed by:   [name] MD on 06/13/2008   Method used:   Print then Give to Patient   RxID:   [id number] EXFORGE 5-320 MG  TABS (AMLODIPINE BESYLATE-VALSARTAN) 1 by mouth qd  #30 x 12   Entered and Authorized by:   [name] MD   Signed by:   [name] MD on 06/13/2008   Method used:   Print then Give to Patient   RxID:   [id number] METFORMIN HCL 500 MG TABS (METFORMIN HCL) Take 2 tabs twice daily  #120 x 12   Entered and Authorized by:   [name] MD   Signed by:   [name] MD on 06/13/2008   Method used:   Print then Give to Patient   [id number]  ]

## 2010-08-07 NOTE — Progress Notes (Signed)
Summary: simvastatin  Phone Note Refill Request Message from:  Fax from Pharmacy on October 29, 2008 3:47 PM  Refills Requested: Medication #1:  SIMVASTATIN 20 MG TABS 1 by mouth at bedtime   Last Refilled: 10/13/2008 [facility] [address] [id number]   Method Requested: Electronic Initial call taken by: Tomma Lightning,  October 29, 2008 3:48 PM      Prescriptions: SIMVASTATIN 20 MG TABS (SIMVASTATIN) 1 by mouth at bedtime  #90 x 3   Entered by:   Tomma Lightning   Authorized by:   [name] MD   Signed by:   Tomma Lightning on 10/29/2008   Method used:   Electronically to        [facility]. [id number]* (retail)       [address]       [place], [place]  [address]       Ph: [phone] or [phone]       Fax: [phone]   RxID:   [id number]

## 2010-08-07 NOTE — Progress Notes (Signed)
Summary: ? re recall col   Phone Note From Other Clinic Call back at [phone]   Caller: Referral Coordinator Stanton Kidney Call For: Sharlett Iles Summary of Call: Dr Alain Marion wants to know when is patient due for colon. Initial call taken by: Ronalee Red,  January 08, 2010 4:05 PM  Follow-up for Phone Call        Chart requested for review.  Butch Penny Surface RN  January 09, 2010 8:14 AM  This pt sees Dr. Henrene Pastor.  Chart and message forwarded to [facility]. Follow-up by: Alberteen Spindle RN,  January 09, 2010 2:26 PM  Additional Follow-up for Phone Call Additional follow up Details #1::        [facility] informed that pt. had colon in 01 and was to have one the following year but did not. Pt. was r/s in 03 but did not keep appt. Additional Follow-up by: Abel Presto RN,  January 09, 2010 3:58 PM

## 2010-08-07 NOTE — Assessment & Plan Note (Signed)
Summary: 3 MO ROV /NWS $50   Vital Signs:  Patient profile:   60 year old female Weight:      219 pounds Temp:     97.4 degrees F oral Pulse rate:   104 / minute BP sitting:   134 / 92  (left arm)  History of Present Illness: The patient presents for a follow up of hypertension, diabetes, hyperlipidemia   Current Medications (verified): 1)  Amaryl 4 Mg  Tabs (Glimepiride) .... Once Daily 2)  Lasix 80 Mg  Tabs (Furosemide) .... 1/2 Once Daily 3)  Simvastatin 20 Mg Tabs (Simvastatin) .[name].. 1 By Mouth At Bedtime 4)  Vitamin D3 1000 Unit  Tabs (Cholecalciferol) .[name].. 1 By Mouth Daily 5)  Spironolactone 50 Mg  Tabs (Spironolactone) .[name].. 1 By Mouth Qd 6)  Metformin Hcl 500 Mg Tabs (Metformin Hcl) .... Take 2 Tabs Twice Daily 7)  Proair Hfa 108 (90 Base) Mcg/act  Aers (Albuterol Sulfate) .... 2 Inh Q4h As Needed Shortness of Breath 8)  Exforge 5-320 Mg  Tabs (Amlodipine Besylate-Valsartan) .[name].. 1 By Mouth Qd  Allergies (verified): 1)  ! Darvocet A500 (Propoxyphene N-Apap) 2)  ! Lipitor (Atorvastatin Calcium) 3)  Hydrochlorothiazide 4)  Enalapril Maleate  Past History  Past Medical History: Anemia-iron deficiency Colon cancer, hx of Diabetes mellitus, type II Hyperlipidemia Hypertension Low back pain Obesity COPD  (02/09/2008)  Past Surgical History: Colectomy  (10/13/2007)  Family History: Family History Hypertension  (10/13/2007)  Social History: Occupation: CNA Single widow Current Smoker  (02/09/2008)  Risk Factors: Alcohol Use: N/A >5 drinks/d w/in last 3 months: N/A Caffeine Use: N/A Diet: N/A Exercise: N/A  Physical Exam  General:  overweight-appearing.   Head:  Normocephalic and atraumatic without obvious abnormalities. No apparent alopecia or balding. Eyes:  glasses Mouth:  Oral mucosa and oropharynx without lesions or exudates.  Dentures. Lungs:  Normal respiratory effort, chest expands symmetrically. Lungs are clear to auscultation, no crackles  or wheezes. Heart:  Normal rate and regular rhythm. S1 and S2 normal without gallop, murmur, click, rub or other extra sounds. Abdomen:  Bowel sounds positive,abdomen soft and non-tender without masses, organomegaly or hernias noted. Msk:  B ancerina tender  Neurologic:  No cranial nerve deficits noted. Station and gait are normal. Plantar reflexes are down-going bilaterally. DTRs are symmetrical throughout. Sensory, motor and coordinative functions appear intact. Skin:  Intact without suspicious lesions or rashes Psych:  Cognition and judgment appear intact. Alert and cooperative with normal attention span and concentration. No apparent delusions, illusions, hallucinations   Impression & Recommendations:  Problem # 1:  HYPERTENSION (ICD-401.9) Assessment Unchanged  Her updated medication list for this problem includes:    Lasix 80 Mg Tabs (Furosemide) .[name]... 1/2 once daily    Spironolactone 50 Mg Tabs (Spironolactone) .[name]... 1 by mouth qd    Exforge 5-320 Mg Tabs (Amlodipine besylate-valsartan) .[name]... 1 by mouth qd  Problem # 2:  DIABETES MELLITUS, TYPE II (ICD-250.00) Assessment: Deteriorated Refusing insulin The following medications were removed from the medication list:    Metformin Hcl 500 Mg Tabs (Metformin hcl) .[name]... Take 2 tabs twice daily Her updated medication list for this problem includes:    Amaryl 4 Mg Tabs (Glimepiride) ..... Once daily    Exforge 5-320 Mg Tabs (Amlodipine besylate-valsartan) .[name]... 1 by mouth qd    Janumet 50-1000 Mg Tabs (Sitagliptin-metformin hcl) .[name]... 1 by mouth bid  Problem # 3:  HYPERLIPIDEMIA (B2193296.4) Assessment: Improved  Her updated medication list for this problem includes:  Simvastatin 20 Mg Tabs (Simvastatin) .[name]... 1 by mouth at bedtime  Problem # 4:  LEG PAIN, BILATERAL (ICD-729.5) B ancerina bursitis Assessment: [place] Treat #2  Complete Medication List: 1)  Amaryl 4 Mg Tabs (Glimepiride) .... Once daily 2)  Lasix 80 Mg Tabs  (Furosemide) .... 1/2 once daily 3)  Simvastatin 20 Mg Tabs (Simvastatin) .[name].. 1 by mouth at bedtime 4)  Vitamin D3 1000 Unit Tabs (Cholecalciferol) .[name].. 1 by mouth daily 5)  Spironolactone 50 Mg Tabs (Spironolactone) .[name].. 1 by mouth qd 6)  Proair Hfa 108 (90 Base) Mcg/act Aers (Albuterol sulfate) .... 2 inh q4h as needed shortness of breath 7)  Exforge 5-320 Mg Tabs (Amlodipine besylate-valsartan) .[name].. 1 by mouth qd 8)  Janumet 50-1000 Mg Tabs (Sitagliptin-metformin hcl) .[name].. 1 by mouth bid   Patient Instructions: 1)  Please schedule a follow-up appointment in 3 months. 2)  BMP prior to visit, ICD-9: 3)  HbgA1C prior to visit, ICD-9: 4)  Vit B12  729.5 250.02 5)  CK 6)  The medication list was reviewed and reconciled.  All changed / newly prescribed medications were explained.  A complete medication list was provided to the patient / caregiver.   Prescriptions: AMARYL 4 MG  TABS (GLIMEPIRIDE) once daily  #30 x 12   Entered and Authorized by:   [name] MD   Signed by:   [name] MD on 09/11/2008   Method used:   Print then Give to Patient   RxID:   [id number] JANUMET 50-1000 MG TABS (SITAGLIPTIN-METFORMIN HCL) 1 by mouth bid  #60 x 12   Entered and Authorized by:   [name] MD   Signed by:   [name] MD on 09/11/2008   Method used:   Print then Give to Patient   RxID:   [id number]

## 2010-08-07 NOTE — Assessment & Plan Note (Signed)
Summary: 4 MTH FU  $50  STC   Vital Signs:  Patient profile:   60 year old female Weight:      219 pounds Temp:     97.4 degrees F oral Pulse rate:   97 / minute BP sitting:   150 / 80  (left arm)  Vitals Entered By: Doralee Albino (April 18, 2009 10:03 AM) CC: f/u Is Patient Diabetic? Yes   CC:  f/u.  History of Present Illness: The patient presents for a follow up of hypertension, diabetes, hyperlipidemia, LBP   Preventive Screening-Counseling & Management  Alcohol-Tobacco     Smoking Status: current  Current Medications (verified): 1)  Amaryl 4 Mg  Tabs (Glimepiride) .... Once Daily 2)  Lasix 80 Mg  Tabs (Furosemide) .... 1/2 Once Daily 3)  Simvastatin 20 Mg Tabs (Simvastatin) .[name].. 1 By Mouth At Bedtime 4)  Vitamin D3 1000 Unit  Tabs (Cholecalciferol) .[name].. 1 By Mouth Daily 5)  Spironolactone 50 Mg  Tabs (Spironolactone) .[name].. 1 By Mouth Qd 6)  Proair Hfa 108 (90 Base) Mcg/act  Aers (Albuterol Sulfate) .... 2 Inh Q4h As Needed Shortness of Breath 7)  Exforge 5-320 Mg  Tabs (Amlodipine Besylate-Valsartan) .[name].. 1 By Mouth Qd 8)  Janumet 50-1000 Mg Tabs (Sitagliptin-Metformin Hcl) .[name].. 1 By Mouth Bid 9)  Nexium 40 Mg Cpdr (Esomeprazole Magnesium) .... Take 1 Tab Each Morning  Allergies: 1)  ! Darvocet A500 (Propoxyphene N-Apap) 2)  ! Lipitor (Atorvastatin Calcium) 3)  Hydrochlorothiazide 4)  Enalapril Maleate  Past History:  Past Medical History: Last updated: 12/19/2008 Anemia-iron deficiency Colon cancer, hx of Diabetes mellitus, type II Hyperlipidemia Hypertension Low back pain Obesity COPD Renal insufficiency  Social History: Last updated: 02/09/2008 Occupation: CNA Single widow Current Smoker  Physical Exam  General:  overweight-appearing.   Mouth:  Erythematous throat mucosa and intranasal erythema.  Neck:  No deformities, masses, or tenderness noted. Lungs:  Normal respiratory effort, chest expands symmetrically. Lungs are clear to  auscultation, no crackles or wheezes. Heart:  Normal rate and regular rhythm. S1 and S2 normal without gallop, murmur, click, rub or other extra sounds. Abdomen:  Bowel sounds positive,abdomen soft and non-tender without masses, organomegaly or hernias noted. Msk:  B ancerina tender  Extremities:  trace left pedal edema.   Neurologic:  No cranial nerve deficits noted. Station and gait are normal. Plantar reflexes are down-going bilaterally. DTRs are symmetrical throughout. Sensory, motor and coordinative functions appear intact. Skin:  Intact without suspicious lesions or rashes Psych:  Cognition and judgment appear intact. Alert and cooperative with normal attention span and concentration. No apparent delusions, illusions, hallucinations   Impression & Recommendations:  Problem # 1:  HYPERTENSION (ICD-401.9) Assessment Comment Only  Her updated medication list for this problem includes:    Lasix 80 Mg Tabs (Furosemide) .[name]... 1/2 once daily    Spironolactone 50 Mg Tabs (Spironolactone) .[name]... 1 by mouth qd    Exforge 5-320 Mg Tabs (Amlodipine besylate-valsartan) .[name]... 1 by mouth qd  BP today: 150/80 Prior BP: 136/72 (12/19/2008)  Labs Reviewed: K+: 3.8 (04/15/2009) Creat: : 0.9 (04/15/2009)   Chol: 112 (04/15/2009)   HDL: 43.20 (04/15/2009)   LDL: 59 (04/15/2009)   TG: 49.0 (04/15/2009)  Problem # 2:  LOW BACK PAIN (ICD-724.2) Assessment: Comment Only See "Patient Instructions".  Problem # 3:  COLON CANCER, HX OF (ICD-V10.05) Assessment: Comment Only The labs were reviewed with the patient.   Problem # 4:  ANEMIA-IRON DEFICIENCY (ICD-280.9) Assessment: Unchanged  Problem #  5:  DIABETES MELLITUS, TYPE II (ICD-250.00) Assessment: Unchanged  Her updated medication list for this problem includes:    Amaryl 4 Mg Tabs (Glimepiride) ..... Once daily    Exforge 5-320 Mg Tabs (Amlodipine besylate-valsartan) .[name]... 1 by mouth qd    Janumet 50-1000 Mg Tabs (Sitagliptin-metformin hcl)  .[name]... 1 by mouth bid  Problem # 6:  SINUSITIS, ACUTE (ICD-461.9) Assessment: New  Her updated medication list for this problem includes:    Zithromax Z-pak 250 Mg Tabs (Azithromycin) ..... Use as directed    Maxichlor Peh 4-10 Mg Tabs (Chlorpheniramine-phenylephrine) .[name]... 1 by mouth two times a day prn    Ceftin 500 Mg Tabs (Cefuroxime axetil) .[name]... 1 by mouth two times a day if not better  Complete Medication List: 1)  Amaryl 4 Mg Tabs (Glimepiride) .... Once daily 2)  Lasix 80 Mg Tabs (Furosemide) .... 1/2 once daily 3)  Simvastatin 20 Mg Tabs (Simvastatin) .[name].. 1 by mouth at bedtime 4)  Vitamin D3 1000 Unit Tabs (Cholecalciferol) .[name].. 1 by mouth daily 5)  Spironolactone 50 Mg Tabs (Spironolactone) .[name].. 1 by mouth qd 6)  Proair Hfa 108 (90 Base) Mcg/act Aers (Albuterol sulfate) .... 2 inh q4h as needed shortness of breath 7)  Exforge 5-320 Mg Tabs (Amlodipine besylate-valsartan) .[name].. 1 by mouth qd 8)  Janumet 50-1000 Mg Tabs (Sitagliptin-metformin hcl) .[name].. 1 by mouth bid 9)  Nexium 40 Mg Cpdr (Esomeprazole magnesium) .... Take 1 tab each morning 10)  Zithromax Z-pak 250 Mg Tabs (Azithromycin) .... Use as directed 11)  Maxichlor Peh 4-10 Mg Tabs (Chlorpheniramine-phenylephrine) .[name].. 1 by mouth two times a day prn 12)  Ceftin 500 Mg Tabs (Cefuroxime axetil) .[name].. 1 by mouth bid  Patient Instructions: 1)  Try to eat more raw plant food, fresh and dry fruit, raw almonds, leafy vegetables, whole foods and less red meat, less animal fat. Poultry and fish is better for you than pork and beef. Avoid processed foods (canned soups, hot dogs, sausage, bacon , frozen dinners). Avoid corn syrup, high fructose syrup or aspartam and Splenda  containing drinks. Honey, Agave and Stevia are better sweeteners. Make your own  dressing with olive oil, wine vinegar, lemon juce, garlic etc. for your salads. 2)  Use stretching exercises that I have provided (15 min. or longer every day) 3)  Please schedule a  follow-up appointment in 4 months. 4)  BMP prior to visit, ICD-9: 5)  HbgA1C prior to visit, ICD-9:250.00  Prescriptions: CEFTIN 500 MG TABS (CEFUROXIME AXETIL) 1 by mouth bid  #20 x 0   Entered and Authorized by:   [name] MD   Signed by:   [name] MD on 04/18/2009   Method used:   Print then Give to Patient   RxID:   [id number] MAXICHLOR PEH 4-10 MG TABS (CHLORPHENIRAMINE-PHENYLEPHRINE) 1 by mouth two times a day prn  #60 x 1   Entered and Authorized by:   [name] MD   Signed by:   [name] MD on 04/18/2009   Method used:   Electronically to        [facility]. [id number]* (retail)       [address]       [place], [place]  [address]       Ph: [phone] or [phone]       Fax: [phone]   RxID:   [id number] ZITHROMAX Z-PAK 250 MG  TABS (AZITHROMYCIN)  Use as directed  #1 x 0   Entered and Authorized by:   [name] MD   Signed by:   [name] MD on 04/18/2009   Method used:   Electronically to        [facility]. [id number]* (retail)       [address]       [place], [place]  [address]       Ph: [phone] or [phone]       Fax: [phone]   RxID:   [id number]

## 2010-08-07 NOTE — Assessment & Plan Note (Signed)
Summary: 4 mo rov /nws #   Vital Signs:  Patient profile:   60 year old female Weight:      224 pounds Temp:     98.4 degrees F oral Pulse rate:   101 / minute BP sitting:   126 / 80  (left arm)  Vitals Entered By: Doralee Albino (August 26, 2009 9:23 AM)  Contraindications/Deferment of Procedures/Staging:    Test/Procedure: Pneumovax vaccine    Reason for deferment: patient declined  CC: F/U Is Patient Diabetic? No   CC:  F/U.  History of Present Illness: The patient presents for a follow up of hypertension, diabetes, hyperlipidemia   Preventive Screening-Counseling & Management  Alcohol-Tobacco     Smoking Status: current  Current Medications (verified): 1)  Amaryl 4 Mg  Tabs (Glimepiride) .... Once Daily 2)  Lasix 80 Mg  Tabs (Furosemide) .... 1/2 Once Daily 3)  Simvastatin 20 Mg Tabs (Simvastatin) .[name].. 1 By Mouth At Bedtime 4)  Vitamin D3 1000 Unit  Tabs (Cholecalciferol) .[name].. 1 By Mouth Daily 5)  Spironolactone 50 Mg  Tabs (Spironolactone) .[name].. 1 By Mouth Qd 6)  Proair Hfa 108 (90 Base) Mcg/act  Aers (Albuterol Sulfate) .... 2 Inh Q4h As Needed Shortness of Breath 7)  Exforge 5-320 Mg  Tabs (Amlodipine Besylate-Valsartan) .[name].. 1 By Mouth Qd 8)  Janumet 50-1000 Mg Tabs (Sitagliptin-Metformin Hcl) .[name].. 1 By Mouth Bid 9)  Nexium 40 Mg Cpdr (Esomeprazole Magnesium) .... Take 1 Tab Each Morning 10)  Maxichlor Peh 4-10 Mg Tabs (Chlorpheniramine-Phenylephrine) .[name].. 1 By Mouth Two Times A Day Prn  Allergies: 1)  ! Darvocet A500 2)  ! Lipitor (Atorvastatin Calcium) 3)  Hydrochlorothiazide 4)  Enalapril Maleate  Past History:  Past Medical History: Last updated: 12/19/2008 Anemia-iron deficiency Colon cancer, hx of Diabetes mellitus, type II Hyperlipidemia Hypertension Low back pain Obesity COPD Renal insufficiency  Past Surgical History: Last updated: 10/13/2007 Colectomy  Family History: Last updated: 10/13/2007 Family History Hypertension  Social  History: Occupation: CNA working a lot Single widow Current Smoker  Review of Systems  The patient denies fever, dyspnea on exertion, and abdominal pain.    Physical Exam  General:  overweight-appearing.   Nose:  Swollen mucosa Mouth:  Moist Lungs:  Normal respiratory effort, chest expands symmetrically. Lungs are clear to auscultation, no crackles or wheezes. Heart:  Normal rate and regular rhythm. S1 and S2 normal without gallop, murmur, click, rub or other extra sounds. Abdomen:  Bowel sounds positive,abdomen soft and non-tender without masses, organomegaly or hernias noted. Msk:  B ancerina tender  Extremities:  trace left pedal edema.   Neurologic:  No cranial nerve deficits noted. Station and gait are normal. Plantar reflexes are down-going bilaterally. DTRs are symmetrical throughout. Sensory, motor and coordinative functions appear intact. Skin:  Intact without suspicious lesions or rashes Psych:  Cognition and judgment appear intact. Alert and cooperative with normal attention span and concentration. No apparent delusions, illusions, hallucinations   Impression & Recommendations:  Problem # 1:  LOW BACK PAIN (ICD-724.2) Assessment Unchanged On prescription therapy   Problem # 2:  TOBACCO USE DISORDER/SMOKER-SMOKING CESSATION DISCUSSED (ICD-305.1) Assessment: Improved  Encouraged smoking cessation and discussed different methods for smoking cessation.   Problem # 3:  HYPERTENSION (ICD-401.9) Assessment: Unchanged  Her updated medication list for this problem includes:    Lasix 80 Mg Tabs (Furosemide) .[name]... 1/2 once daily    Spironolactone 50 Mg Tabs (Spironolactone) .[name]... 1 by mouth qd    Exforge 5-320 Mg Tabs (  Amlodipine besylate-valsartan) .[name]... 1 by mouth qd  Problem # 4:  DIABETES MELLITUS, TYPE II (ICD-250.00) Assessment: Deteriorated  Her updated medication list for this problem includes:    Amaryl 4 Mg Tabs (Glimepiride) ..... Once daily    Exforge 5-320  Mg Tabs (Amlodipine besylate-valsartan) .[name]... 1 by mouth qd    Janumet 50-1000 Mg Tabs (Sitagliptin-metformin hcl) .[name]... 1 by mouth bid  Labs Reviewed: Creat: 1.0 (08/22/2009)    Reviewed HgBA1c results: 8.2 (08/22/2009)  8.0 (04/15/2009)  Problem # 5:  COLON CANCER, HX OF (ICD-V10.05) Assessment: Comment Only The labs were reviewed with the patient.   Complete Medication List: 1)  Amaryl 4 Mg Tabs (Glimepiride) .... Once daily 2)  Lasix 80 Mg Tabs (Furosemide) .... 1/2 once daily 3)  Simvastatin 20 Mg Tabs (Simvastatin) .[name].. 1 by mouth at bedtime 4)  Vitamin D3 1000 Unit Tabs (Cholecalciferol) .[name].. 1 by mouth daily 5)  Spironolactone 50 Mg Tabs (Spironolactone) .[name].. 1 by mouth qd 6)  Proair Hfa 108 (90 Base) Mcg/act Aers (Albuterol sulfate) .... 2 inh q4h as needed shortness of breath 7)  Exforge 5-320 Mg Tabs (Amlodipine besylate-valsartan) .[name].. 1 by mouth qd 8)  Janumet 50-1000 Mg Tabs (Sitagliptin-metformin hcl) .[name].. 1 by mouth bid 9)  Nexium 40 Mg Cpdr (Esomeprazole magnesium) .... Take 1 tab each morning 10)  Maxichlor Peh 4-10 Mg Tabs (Chlorpheniramine-phenylephrine) .[name].. 1 by mouth two times a day prn  Patient Instructions: 1)  HOLD SIMVASTATIN X 1-2 WKS -?? CRAMPS 2)  Please schedule a follow-up appointment in 4 months well w/labs and A1c vit B12 and CEA. 3)  Use the Sinus rinse as needed

## 2010-08-07 NOTE — Assessment & Plan Note (Signed)
Summary: 4 MO ROV /NWS  #   Vital Signs:  Patient profile:   60 year old female Height:      62 inches Weight:      221 pounds BMI:     40.57 Temp:     97.6 degrees F oral Pulse rate:   92 / minute Pulse rhythm:   regular Resp:     16 per minute BP sitting:   130 / 80  (left arm) Cuff size:   large  Vitals Entered By: Jonathon Resides, CMA(AAMA) (May 21, 2010 10:16 AM) CC: 4 mo f/u Comments pt cant take Spironolactone because it cause leg cramps   CC:  4 mo f/u.  History of Present Illness: The patient presents for a follow up of hypertension, diabetes, hyperlipidemia  C/o cough Spironolactone caused cramps  Preventive Screening-Counseling & Management  Caffeine-Diet-Exercise     Does Patient Exercise: no  Current Medications (verified): 1)  Amaryl 4 Mg  Tabs (Glimepiride) .... Once Daily 2)  Lasix 80 Mg  Tabs (Furosemide) .... 1/2 Once Daily Prn 3)  Vitamin D3 1000 Unit  Tabs (Cholecalciferol) .[name].. 1 By Mouth Daily 4)  Spironolactone 50 Mg  Tabs (Spironolactone) .[name].. 1 By Mouth Qd 5)  Proair Hfa 108 (90 Base) Mcg/act  Aers (Albuterol Sulfate) .... 2 Inh Q4h As Needed Shortness of Breath 6)  Exforge 5-320 Mg  Tabs (Amlodipine Besylate-Valsartan) .[name].. 1 By Mouth Qd 7)  Janumet 50-1000 Mg Tabs (Sitagliptin-Metformin Hcl) .[name].. 1 By Mouth Bid 8)  Nexium 40 Mg Cpdr (Esomeprazole Magnesium) .... Take 1 Tab Each Morning 9)  Maxichlor Peh 4-10 Mg Tabs (Chlorpheniramine-Phenylephrine) .[name].. 1 By Mouth Two Times A Day Prn 10)  Vitamin B-12 500 Mcg Tabs (Cyanocobalamin) .[name].. 1 By Mouth Once Daily For Vitamin B12 Deficiency 11)  Ferro-Bob 325 (65 Fe) Mg Tabs (Ferrous Sulfate) .[name].. 1 By Mouth Once Daily For Anemia  Allergies (verified): 1)  ! Darvocet A500 2)  ! Lipitor (Atorvastatin Calcium) 3)  Hydrochlorothiazide 4)  Enalapril Maleate 5)  Zocor 6)  Spironolactone  Past History:  Past Medical History: Last updated: 12/19/2008 Anemia-iron deficiency Colon cancer, hx  of Diabetes mellitus, type II Hyperlipidemia Hypertension Low back pain Obesity COPD Renal insufficiency  Social History: Occupation: CNA working a lot Single widow - has a friend Current Smoker Alcohol use-no Regular exercise-no Does Patient Exercise:  no  Review of Systems  The patient denies fever, dyspnea on exertion, melena, hematochezia, and depression.     Impression & Recommendations:  Problem # 1:  HYPERTENSION (ICD-401.9) Assessment Unchanged  The following medications were removed from the medication list:    Spironolactone 50 Mg Tabs (Spironolactone) .[name]... 1 by mouth qd Her updated medication list for this problem includes:    Lasix 80 Mg Tabs (Furosemide) .[name]... 1/2 once daily prn    Exforge 5-320 Mg Tabs (Amlodipine besylate-valsartan) .[name]... 1 by mouth qd  BP today: 130/80 Prior BP: 120/80 (01/07/2010)  Labs Reviewed: K+: 3.8 (05/06/2010) Creat: : 0.8 (05/06/2010)   Chol: 154 (05/06/2010)   HDL: 45.90 (05/06/2010)   LDL: 97 (05/06/2010)   TG: 57.0 (05/06/2010)  Problem # 2:  HYPERLIPIDEMIA (ICD-272.4) Assessment: Unchanged  Labs Reviewed: SGOT: 14 (05/06/2010)   SGPT: 14 (05/06/2010)   HDL:45.90 (05/06/2010), 44.70 (12/31/2009)  LDL:97 (05/06/2010), 95 (12/31/2009)  Chol:154 (05/06/2010), 157 (12/31/2009)  Trig:57.0 (05/06/2010), 89.0 (12/31/2009)  Problem # 3:  DIABETES MELLITUS, TYPE II (ICD-250.00) Assessment: Unchanged  Her updated medication list for this problem includes:  Janumet 50-1000 Mg Tabs (Sitagliptin-metformin hcl) .[name]... 1 by mouth bid    Amaryl 4 Mg Tabs (Glimepiride) .[name]... 1 by mouth bid    Exforge 5-320 Mg Tabs (Amlodipine besylate-valsartan) .[name]... 1 by mouth qd  Problem # 4:  ANEMIA-IRON DEFICIENCY (ICD-280.9) Assessment: Deteriorated Colonosc adviced Her updated medication list for this problem includes:    Vitamin B-12 500 Mcg Tabs (Cyanocobalamin) .[name]... 1 by mouth once daily for vitamin b12 deficiency    Ferro-bob 325  (65 Fe) Mg Tabs (Ferrous sulfate) .[name]... 1 by mouth once daily for anemia  Complete Medication List: 1)  Janumet 50-1000 Mg Tabs (Sitagliptin-metformin hcl) .[name].. 1 by mouth bid 2)  Amaryl 4 Mg Tabs (Glimepiride) .[name].. 1 by mouth bid 3)  Lasix 80 Mg Tabs (Furosemide) .... 1/2 once daily prn 4)  Vitamin D3 1000 Unit Tabs (Cholecalciferol) .[name].. 1 by mouth daily 5)  Proair Hfa 108 (90 Base) Mcg/act Aers (Albuterol sulfate) .... 2 inh q4h as needed shortness of breath 6)  Exforge 5-320 Mg Tabs (Amlodipine besylate-valsartan) .[name].. 1 by mouth qd 7)  Nexium 40 Mg Cpdr (Esomeprazole magnesium) .... Take 1 tab each morning 8)  Maxichlor Peh 4-10 Mg Tabs (Chlorpheniramine-phenylephrine) .[name].. 1 by mouth two times a day prn 9)  Vitamin B-12 500 Mcg Tabs (Cyanocobalamin) .[name].. 1 by mouth once daily for vitamin b12 deficiency 10)  Ferro-bob 325 (65 Fe) Mg Tabs (Ferrous sulfate) .[name].. 1 by mouth once daily for anemia 11)  Promethazine-codeine 6.25-10 Mg/68ml Syrp (Promethazine-codeine) .... 5-10 ml by mouth q id as needed cough  Contraindications/Deferment of Procedures/Staging:    Test/Procedure: FLU VAX    Reason for deferment: patient declined   Patient Instructions: 1)  Please schedule a follow-up appointment in 4 months. 2)  BMP prior to visit, ICD-9: 3)  Hepatic Panel prior to visit, ICD-9:250.02 4)  HbgA1C prior to visit, ICD-9: Prescriptions: AMARYL 4 MG  TABS (GLIMEPIRIDE) 1 by mouth bid  #60 x 12   Entered and Authorized by:   [name] MD   Signed by:   [name] MD on 05/21/2010   Method used:   Print then Give to Patient   RxID:   [id number] [place] 6.25-10 MG/5ML SYRP (PROMETHAZINE-CODEINE) 5-10 ml by mouth q id as needed cough  #300 ml x 0   Entered and Authorized by:   [name] MD   Signed by:   [name] MD on 05/21/2010   Method used:   Print then Give to Patient   RxID:   [id number]    Orders Added: 1)  Est.  Patient Level IV [id number]

## 2010-09-15 ENCOUNTER — Other Ambulatory Visit: Payer: Self-pay

## 2010-09-22 ENCOUNTER — Ambulatory Visit: Payer: Self-pay | Admitting: Internal Medicine

## 2013-04-24 ENCOUNTER — Telehealth: Payer: Self-pay | Admitting: Internal Medicine

## 2013-04-24 NOTE — Telephone Encounter (Signed)
The patient called and is hoping to come in for shortness of breath.  She has not seen Dr.[name] in several years (since 2011) and states she has no insurance.  She is unable to pay the 145.00 to be seen without insurance, and was hoping we could waive this fee for her.   Is that something that can be done?   Thanks!

## 2013-04-25 ENCOUNTER — Ambulatory Visit: Payer: Self-pay | Admitting: Internal Medicine

## 2013-04-25 NOTE — Telephone Encounter (Signed)
Management has agreed to bill this patient for the 145.00 fee, due to her not having insurance.   Does Dr.[name] want to this patient to re-est with him?  Her last apt was in 2011.   Thanks!

## 2013-04-25 NOTE — Telephone Encounter (Signed)
Ok Thx 

## 2013-04-26 NOTE — Telephone Encounter (Signed)
Yes

## 2013-06-06 ENCOUNTER — Encounter (HOSPITAL_COMMUNITY): Payer: Self-pay | Admitting: Radiology

## 2013-06-06 ENCOUNTER — Inpatient Hospital Stay (HOSPITAL_COMMUNITY)
Admission: EM | Admit: 2013-06-06 | Discharge: 2013-06-11 | DRG: 246 | Disposition: A | Discharge status: Home / Self Care | Payer: Self-pay | Attending: Emergency Medicine | Admitting: Emergency Medicine

## 2013-06-06 ENCOUNTER — Emergency Department (HOSPITAL_COMMUNITY): Payer: Self-pay

## 2013-06-06 DIAGNOSIS — J449 Chronic obstructive pulmonary disease, unspecified: Secondary | ICD-10-CM | POA: Diagnosis present

## 2013-06-06 DIAGNOSIS — N289 Disorder of kidney and ureter, unspecified: Secondary | ICD-10-CM

## 2013-06-06 DIAGNOSIS — E1310 Other specified diabetes mellitus with ketoacidosis without coma: Secondary | ICD-10-CM | POA: Diagnosis present

## 2013-06-06 DIAGNOSIS — I248 Other forms of acute ischemic heart disease: Secondary | ICD-10-CM | POA: Diagnosis present

## 2013-06-06 DIAGNOSIS — I5042 Chronic combined systolic (congestive) and diastolic (congestive) heart failure: Secondary | ICD-10-CM

## 2013-06-06 DIAGNOSIS — Z85038 Personal history of other malignant neoplasm of large intestine: Secondary | ICD-10-CM

## 2013-06-06 DIAGNOSIS — I161 Hypertensive emergency: Secondary | ICD-10-CM

## 2013-06-06 DIAGNOSIS — J9690 Respiratory failure, unspecified, unspecified whether with hypoxia or hypercapnia: Secondary | ICD-10-CM

## 2013-06-06 DIAGNOSIS — E669 Obesity, unspecified: Secondary | ICD-10-CM | POA: Diagnosis present

## 2013-06-06 DIAGNOSIS — E876 Hypokalemia: Secondary | ICD-10-CM | POA: Diagnosis not present

## 2013-06-06 DIAGNOSIS — R0902 Hypoxemia: Secondary | ICD-10-CM

## 2013-06-06 DIAGNOSIS — J4489 Other specified chronic obstructive pulmonary disease: Secondary | ICD-10-CM | POA: Diagnosis present

## 2013-06-06 DIAGNOSIS — I2489 Other forms of acute ischemic heart disease: Secondary | ICD-10-CM | POA: Diagnosis present

## 2013-06-06 DIAGNOSIS — J9600 Acute respiratory failure, unspecified whether with hypoxia or hypercapnia: Secondary | ICD-10-CM | POA: Diagnosis present

## 2013-06-06 DIAGNOSIS — E872 Acidosis: Secondary | ICD-10-CM

## 2013-06-06 DIAGNOSIS — R0603 Acute respiratory distress: Secondary | ICD-10-CM

## 2013-06-06 DIAGNOSIS — I2589 Other forms of chronic ischemic heart disease: Secondary | ICD-10-CM | POA: Diagnosis present

## 2013-06-06 DIAGNOSIS — G92 Toxic encephalopathy: Secondary | ICD-10-CM | POA: Diagnosis present

## 2013-06-06 DIAGNOSIS — I255 Ischemic cardiomyopathy: Secondary | ICD-10-CM

## 2013-06-06 DIAGNOSIS — M545 Low back pain, unspecified: Secondary | ICD-10-CM

## 2013-06-06 DIAGNOSIS — I169 Hypertensive crisis, unspecified: Secondary | ICD-10-CM

## 2013-06-06 DIAGNOSIS — G929 Unspecified toxic encephalopathy: Secondary | ICD-10-CM | POA: Diagnosis present

## 2013-06-06 DIAGNOSIS — Z79899 Other long term (current) drug therapy: Secondary | ICD-10-CM

## 2013-06-06 DIAGNOSIS — I509 Heart failure, unspecified: Secondary | ICD-10-CM | POA: Diagnosis present

## 2013-06-06 DIAGNOSIS — I5031 Acute diastolic (congestive) heart failure: Secondary | ICD-10-CM | POA: Diagnosis present

## 2013-06-06 DIAGNOSIS — J811 Chronic pulmonary edema: Secondary | ICD-10-CM

## 2013-06-06 DIAGNOSIS — E874 Mixed disorder of acid-base balance: Secondary | ICD-10-CM | POA: Diagnosis present

## 2013-06-06 DIAGNOSIS — M79609 Pain in unspecified limb: Secondary | ICD-10-CM

## 2013-06-06 DIAGNOSIS — J019 Acute sinusitis, unspecified: Secondary | ICD-10-CM

## 2013-06-06 DIAGNOSIS — E1159 Type 2 diabetes mellitus with other circulatory complications: Secondary | ICD-10-CM | POA: Diagnosis present

## 2013-06-06 DIAGNOSIS — I2510 Atherosclerotic heart disease of native coronary artery without angina pectoris: Secondary | ICD-10-CM | POA: Diagnosis present

## 2013-06-06 DIAGNOSIS — I214 Non-ST elevation (NSTEMI) myocardial infarction: Principal | ICD-10-CM | POA: Diagnosis present

## 2013-06-06 DIAGNOSIS — R634 Abnormal weight loss: Secondary | ICD-10-CM

## 2013-06-06 DIAGNOSIS — N179 Acute kidney failure, unspecified: Secondary | ICD-10-CM | POA: Diagnosis present

## 2013-06-06 DIAGNOSIS — R739 Hyperglycemia, unspecified: Secondary | ICD-10-CM

## 2013-06-06 DIAGNOSIS — D509 Iron deficiency anemia, unspecified: Secondary | ICD-10-CM

## 2013-06-06 DIAGNOSIS — E119 Type 2 diabetes mellitus without complications: Secondary | ICD-10-CM | POA: Diagnosis present

## 2013-06-06 DIAGNOSIS — F17200 Nicotine dependence, unspecified, uncomplicated: Secondary | ICD-10-CM | POA: Diagnosis present

## 2013-06-06 DIAGNOSIS — N259 Disorder resulting from impaired renal tubular function, unspecified: Secondary | ICD-10-CM

## 2013-06-06 DIAGNOSIS — E785 Hyperlipidemia, unspecified: Secondary | ICD-10-CM | POA: Diagnosis present

## 2013-06-06 DIAGNOSIS — I5043 Acute on chronic combined systolic (congestive) and diastolic (congestive) heart failure: Secondary | ICD-10-CM | POA: Diagnosis present

## 2013-06-06 DIAGNOSIS — I10 Essential (primary) hypertension: Secondary | ICD-10-CM | POA: Diagnosis present

## 2013-06-06 HISTORY — DX: Ischemic cardiomyopathy: I25.5

## 2013-06-06 HISTORY — DX: Non-ST elevation (NSTEMI) myocardial infarction: I21.4

## 2013-06-06 LAB — CBC WITH DIFFERENTIAL/PLATELET
Basophils Absolute: 0 10*3/uL (ref 0.0–0.1)
Basophils Relative: 0 % (ref 0–1)
Eosinophils Absolute: 0.1 10*3/uL (ref 0.0–0.7)
Eosinophils Relative: 1 % (ref 0–5)
HCT: 46.4 % — ABNORMAL HIGH (ref 36.0–46.0)
Hemoglobin: 15.7 g/dL — ABNORMAL HIGH (ref 12.0–15.0)
Lymphocytes Relative: 59 % — ABNORMAL HIGH (ref 12–46)
Lymphs Abs: 7.4 10*3/uL — ABNORMAL HIGH (ref 0.7–4.0)
MCH: 29.6 pg (ref 26.0–34.0)
MCHC: 33.8 g/dL (ref 30.0–36.0)
MCV: 87.4 fL (ref 78.0–100.0)
Monocytes Absolute: 0.9 10*3/uL (ref 0.1–1.0)
Monocytes Relative: 7 % (ref 3–12)
Neutro Abs: 4.1 10*3/uL (ref 1.7–7.7)
Neutrophils Relative %: 33 % — ABNORMAL LOW (ref 43–77)
Platelets: 222 10*3/uL (ref 150–400)
RBC: 5.31 MIL/uL — ABNORMAL HIGH (ref 3.87–5.11)
RDW: 13.5 % (ref 11.5–15.5)
WBC: 12.5 10*3/uL — ABNORMAL HIGH (ref 4.0–10.5)

## 2013-06-06 LAB — POCT I-STAT 3, ART BLOOD GAS (G3+)
Acid-base deficit: 4 mmol/L — ABNORMAL HIGH (ref 0.0–2.0)
Bicarbonate: 26.7 mEq/L — ABNORMAL HIGH (ref 20.0–24.0)
O2 Saturation: 100 %
Patient temperature: 36.9
TCO2: 29 mmol/L (ref 0–100)
pCO2 arterial: 75.7 mmHg (ref 35.0–45.0)
pH, Arterial: 7.154 — CL (ref 7.350–7.450)
pO2, Arterial: 313 mmHg — ABNORMAL HIGH (ref 80.0–100.0)

## 2013-06-06 LAB — COMPREHENSIVE METABOLIC PANEL
ALT: 31 U/L (ref 0–35)
AST: 68 U/L — ABNORMAL HIGH (ref 0–37)
Albumin: 2.8 g/dL — ABNORMAL LOW (ref 3.5–5.2)
Alkaline Phosphatase: 224 U/L — ABNORMAL HIGH (ref 39–117)
BUN: 14 mg/dL (ref 6–23)
CO2: 20 mEq/L (ref 19–32)
Calcium: 8.3 mg/dL — ABNORMAL LOW (ref 8.4–10.5)
Chloride: 94 mEq/L — ABNORMAL LOW (ref 96–112)
Creatinine, Ser: 0.88 mg/dL (ref 0.50–1.10)
GFR calc Af Amer: 80 mL/min — ABNORMAL LOW (ref >90)
GFR calc non Af Amer: 69 mL/min — ABNORMAL LOW (ref >90)
Glucose, Bld: 648 mg/dL (ref 70–99)
Potassium: 3.2 mEq/L — ABNORMAL LOW (ref 3.5–5.1)
Sodium: 131 mEq/L — ABNORMAL LOW (ref 135–145)
Total Bilirubin: 0.2 mg/dL — ABNORMAL LOW (ref 0.3–1.2)
Total Protein: 6.7 g/dL (ref 6.0–8.3)

## 2013-06-06 LAB — POCT I-STAT, CHEM 8
BUN: 17 mg/dL (ref 6–23)
Calcium, Ion: 1.18 mmol/L (ref 1.13–1.30)
Chloride: 99 mEq/L (ref 96–112)
Creatinine, Ser: 1.1 mg/dL (ref 0.50–1.10)
Glucose, Bld: 664 mg/dL (ref 70–99)
HCT: 46 % (ref 36.0–46.0)
Hemoglobin: 15.6 g/dL — ABNORMAL HIGH (ref 12.0–15.0)
Potassium: 3.2 mEq/L — ABNORMAL LOW (ref 3.5–5.1)
Sodium: 136 mEq/L (ref 135–145)
TCO2: 22 mmol/L (ref 0–100)

## 2013-06-06 LAB — GLUCOSE, CAPILLARY: Glucose-Capillary: 590 mg/dL (ref 70–99)

## 2013-06-06 LAB — POCT I-STAT TROPONIN I: Troponin i, poc: 0.03 ng/mL (ref 0.00–0.08)

## 2013-06-06 LAB — PRO B NATRIURETIC PEPTIDE: Pro B Natriuretic peptide (BNP): 1477 pg/mL — ABNORMAL HIGH (ref 0–125)

## 2013-06-06 MED ORDER — SODIUM CHLORIDE 0.9 % IV SOLN
INTRAVENOUS | Status: DC
  Administered 2013-06-07: 10.6 [IU]/h via INTRAVENOUS
  Administered 2013-06-07: 5.3 [IU]/h via INTRAVENOUS
  Filled 2013-06-06: qty 1

## 2013-06-06 MED ORDER — PROPOFOL 10 MG/ML IV EMUL
INTRAVENOUS | Status: AC
  Filled 2013-06-06: qty 100

## 2013-06-06 MED ORDER — NITROGLYCERIN IN D5W 200-5 MCG/ML-% IV SOLN
2.0000 ug/min | Freq: Once | INTRAVENOUS | Status: DC
  Filled 2013-06-06: qty 250

## 2013-06-06 MED ORDER — ROCURONIUM BROMIDE 50 MG/5ML IV SOLN
100.0000 mg | Freq: Once | INTRAVENOUS | Status: AC
  Administered 2013-06-06: 100 mg via INTRAVENOUS

## 2013-06-06 MED ORDER — FUROSEMIDE 10 MG/ML IJ SOLN
60.0000 mg | Freq: Once | INTRAMUSCULAR | Status: AC
  Administered 2013-06-06: 60 mg via INTRAVENOUS
  Filled 2013-06-06: qty 6

## 2013-06-06 MED ORDER — ETOMIDATE 2 MG/ML IV SOLN
30.0000 mg | Freq: Once | INTRAVENOUS | Status: AC
  Administered 2013-06-06: 30 mg via INTRAVENOUS

## 2013-06-06 MED ORDER — PROPOFOL 10 MG/ML IV BOLUS
INTRAVENOUS | Status: AC | PRN
  Administered 2013-06-06: 20 mg via INTRAVENOUS

## 2013-06-06 MED ORDER — PROPOFOL 10 MG/ML IV EMUL
5.0000 ug/kg/min | Freq: Once | INTRAVENOUS | Status: AC
  Administered 2013-06-06: 20 ug/kg/min via INTRAVENOUS

## 2013-06-06 NOTE — ED Provider Notes (Signed)
I saw and evaluated the patient, reviewed the resident's note and I agree with the findings and plan.  EKG Interpretation    Date/Time:  Tuesday June 06 2013 22:54:52 EST Ventricular Rate:  140 PR Interval:  216 QRS Duration: 83 QT Interval:  395 QTC Calculation: 603 R Axis:   59 Text Interpretation:  Sinus tachycardia Prolonged PR interval LAE, consider biatrial enlargement Probable left ventricular hypertrophy Abnrm T, consider ischemia, anterolateral lds Anterior ST elevation, probably due to LVH Prolonged QT interval baseline wander improved compared to prior.  Confirmed by [facility]  MD, TREY [id number]) on 06/06/2013 11:24:23 PM           CRITICAL CARE Performed by: Artis Delay   Total critical care time: 30  Critical care time was exclusive of separately billable procedures and treating other patients.  Critical care was necessary to treat or prevent imminent or life-threatening deterioration.  Critical care was time spent personally by me on the following activities: development of treatment plan with patient and/or surrogate as well as nursing, discussions with consultants, evaluation of patient's response to treatment, examination of patient, obtaining history from patient or surrogate, ordering and performing treatments and interventions, ordering and review of laboratory studies, ordering and review of radiographic studies, pulse oximetry and re-evaluation of patient's condition.   I was present for and supervised the entire intubation performed by Dr. Marda Stalker.     62 yo female presenting in respiratory distress and altered mental status.  She required emergent intubation and IV nitroglycerin gtt for treatment of her severe hypertension.  She is also found to be significantly hyperglycemic without signs of DKA. She required insulin GTT.  Admitted to critical care  Clinical Impression: 1. Hypertensive emergency   2. CHF exacerbation   3. Hyperglycemia   4. Respiratory  distress   5. Hypoxia   6. Pulmonary edema   7. Hypokalemia   8. Respiratory failure        Houston Siren, MD 06/07/13 [phone]

## 2013-06-06 NOTE — ED Provider Notes (Signed)
CSN: [id number]     Arrival date & time 06/06/13  2244 History   First MD Initiated Contact with Patient 06/06/13 2249     Chief Complaint  Patient presents with  . Respiratory Distress   (Consider location/radiation/quality/duration/timing/severity/associated sxs/prior Treatment) HPI [name] is a 62 y.o. female who presents to the ED in respiratory distress.  Little is known about patient due to severity of breathing.  Per EMS, neighbor heard her breathing very hard.  They went over to check on her and called 911 when she was not breathing right.  EMS arrived and patient breathing too hard to provide history.  Initial sat with 56% on RA and marked tachypnea.  CPAP applied with 100% O2.  Mental status diminished and did not improve with better oxygenation.  Patient brought in here in critical condition.  No family or friends were aware that she was transferred.  Past Medical History  Diagnosis Date  . Anemia   . Personal history of colon cancer   . Diabetes mellitus   . Hyperlipidemia   . Hypertension   . Low back pain   . Obesity   . COPD (chronic obstructive pulmonary disease)   . Renal insufficiency    Past Surgical History  Procedure Laterality Date  . Colectomy     Family History  Problem Relation Age of Onset  . Hypertension     History  Substance Use Topics  . Smoking status: Current Every Day Smoker  . Smokeless tobacco: Not on file  . Alcohol Use:    OB History   Grav Para Term Preterm Abortions TAB SAB Ect Mult Living                 Review of Systems  Unable to perform ROS: Acuity of condition    Allergies  Atorvastatin; Enalapril maleate; Hydrochlorothiazide; Propoxyphene-acetaminophen; Simvastatin; and Spironolactone  Home Medications   Current Outpatient Rx  Name  Route  Sig  Dispense  Refill  . albuterol (PROAIR HFA) 108 (90 BASE) MCG/ACT inhaler   Inhalation   Inhale 2 puffs into the lungs every 4 (four) hours as needed.            [name] amLODipine-valsartan (EXFORGE) 5-320 MG per tablet   Oral   Take 1 tablet by mouth daily.           . Chlorpheniramine-Phenylephrine (MAXICHLOR PEH) 4-10 MG per tablet   Oral   Take 1 tablet by mouth 2 (two) times daily as needed.           . Cholecalciferol (VITAMIN D3) 1000 UNIT tablet   Oral   Take 1,000 Units by mouth daily.           . cyanocobalamin 500 MCG tablet   Oral   Take 500 mcg by mouth daily.           [name] esomeprazole (NEXIUM) 40 MG capsule   Oral   Take 40 mg by mouth every morning.           . ferrous sulfate (FERRO-BOB) 325 (65 FE) MG tablet   Oral   Take 325 mg by mouth daily.           . furosemide (LASIX) 80 MG tablet   Oral   Take 40 mg by mouth daily.           [name] glimepiride (AMARYL) 4 MG tablet   Oral   Take 4 mg by mouth 2 (two) times daily.           [name]  promethazine-codeine (PHENERGAN WITH CODEINE) 6.25-10 MG/5ML syrup   Oral   Take 5-10 mLs by mouth 4 (four) times daily as needed.           . sitaGLIPtan-metformin (JANUMET) 50-1000 MG per tablet   Oral   Take 1 tablet by mouth 2 (two) times daily.            SpO2 56% Physical Exam  Nursing note and vitals reviewed. Constitutional: She appears well-developed and well-nourished. She appears toxic. She has a sickly appearance. She appears distressed.  HENT:  Head: Normocephalic and atraumatic.  Right Ear: External ear normal.  Left Ear: External ear normal.  Nose: Nose normal.  Mouth/Throat: Oropharynx is clear and moist. No oropharyngeal exudate.  Eyes: EOM are normal. Pupils are equal, round, and reactive to light.  Neck: Normal range of motion. Neck supple. No tracheal deviation present.  Cardiovascular: Tachycardia present.   Pulmonary/Chest: Accessory muscle usage present. No stridor. Tachypnea noted. She is in respiratory distress. She has rales (diffuse).  Abdominal: Soft. She exhibits no distension. There is no tenderness. There is no rebound.   Musculoskeletal: Normal range of motion. She exhibits edema (2+ symmetric).  Neurological: She is unresponsive. GCS eye subscore is 4. GCS verbal subscore is 1. GCS motor subscore is 4.  Skin: Skin is warm. She is diaphoretic.    ED Course  INTUBATION Date/Time: 06/06/2013 11:42 PM Performed by: Rogelia Mire Authorized by: Serita Grit DAVID Consent: Verbal consent not obtained. written consent not obtained. The procedure was performed in an emergent situation. Indications: respiratory distress Intubation method: video-assisted Patient status: paralyzed (RSI) Preoxygenation: BVM Sedatives: etomidate Paralytic: rocuronium Tube size: 7.5 mm Tube type: cuffed Number of attempts: 1 Cricoid pressure: no Cords visualized: yes Post-procedure assessment: chest rise and ETCO2 monitor Breath sounds: equal and absent over the epigastrium Cuff inflated: yes Tube secured with: ETT holder and adhesive tape Chest x-ray interpreted by me. Chest x-ray findings: endotracheal tube in appropriate position Patient tolerance: Patient tolerated the procedure well with no immediate complications.   (including critical care time) Labs Review Labs Reviewed  PRO B NATRIURETIC PEPTIDE - Abnormal; Notable for the following:    Pro B Natriuretic peptide (BNP) 1477.0 (*)    All other components within normal limits  COMPREHENSIVE METABOLIC PANEL - Abnormal; Notable for the following:    Sodium 131 (*)    Potassium 3.2 (*)    Chloride 94 (*)    Glucose, Bld 648 (*)    Calcium 8.3 (*)    Albumin 2.8 (*)    AST 68 (*)    Alkaline Phosphatase 224 (*)    Total Bilirubin 0.2 (*)    [name] calc non Af Amer 69 (*)    [name] calc Af Amer 80 (*)    All other components within normal limits  CBC WITH DIFFERENTIAL - Abnormal; Notable for the following:    WBC 12.5 (*)    RBC 5.31 (*)    Hemoglobin 15.7 (*)    HCT 46.4 (*)    Neutrophils Relative % 33 (*)    Lymphocytes Relative 59 (*)    Lymphs Abs 7.4 (*)     All other components within normal limits  GLUCOSE, CAPILLARY - Abnormal; Notable for the following:    Glucose-Capillary 590 (*)    All other components within normal limits  POCT I-STAT, CHEM 8 - Abnormal; Notable for the following:    Potassium 3.2 (*)    Glucose, Bld 664 (*)  Hemoglobin 15.6 (*)    All other components within normal limits  URINALYSIS, ROUTINE W REFLEX MICROSCOPIC  URINE RAPID DRUG SCREEN (HOSP PERFORMED)  URINALYSIS, ROUTINE W REFLEX MICROSCOPIC  URINE RAPID DRUG SCREEN (HOSP PERFORMED)  POCT I-STAT TROPONIN I   Imaging Review No results found.  EKG Interpretation    Date/Time:  Tuesday June 06 2013 22:54:52 EST Ventricular Rate:  140 PR Interval:  216 QRS Duration: 83 QT Interval:  395 QTC Calculation: 603 R Axis:   59 Text Interpretation:  Sinus tachycardia Prolonged PR interval LAE, consider biatrial enlargement Probable left ventricular hypertrophy Abnrm T, consider ischemia, anterolateral lds Anterior ST elevation, probably due to LVH Prolonged QT interval baseline wander improved compared to prior.  Confirmed by [facility]  MD, TREY ([id number]) on 06/06/2013 11:24:23 PM            MDM   1. CHF exacerbation   2. Hyperglycemia   3. Respiratory distress   4. Hypoxia   5. Hypertensive emergency   6. Pulmonary edema   7. Hypokalemia     [name] is a 62 y.o. female who presented to the ED in extremis.  On arrival, patient tachypneic and markedly hypoxic.  Mental status not appropriate for bipap.  Patient inbutated with glidescope, etomidate, and rocuronium.  BP 0000000 to 123456 systolic.  Marked edema on exam.  Presentation most consistent with CHF exacerbation.  Initial plan to initiate nitro GTT but pressure improved with sedation.  60 of IV lasix administered.  Propofol for sedation.  Of note, glucose elevated in to 600s.  No DKA.  Insulin GTT initiated. No evidence of infection causing symptoms.  Atypical presentation for PE.  Critical  care consulted for admission.   Patient has one friend listed in EMR for emergency contact.  Friend contacted and reports that patient has no husband or blood relatives in area and that she is the only one.  Friend updated on critical nature of patient's status.    Patient admitted.    Rogelia Mire, MD 06/07/13 [phone]

## 2013-06-06 NOTE — ED Notes (Signed)
Patient transported to CT 

## 2013-06-07 DIAGNOSIS — I369 Nonrheumatic tricuspid valve disorder, unspecified: Secondary | ICD-10-CM

## 2013-06-07 DIAGNOSIS — J449 Chronic obstructive pulmonary disease, unspecified: Secondary | ICD-10-CM

## 2013-06-07 DIAGNOSIS — R7309 Other abnormal glucose: Secondary | ICD-10-CM

## 2013-06-07 DIAGNOSIS — I10 Essential (primary) hypertension: Secondary | ICD-10-CM

## 2013-06-07 DIAGNOSIS — E872 Acidosis, unspecified: Secondary | ICD-10-CM

## 2013-06-07 DIAGNOSIS — E119 Type 2 diabetes mellitus without complications: Secondary | ICD-10-CM

## 2013-06-07 DIAGNOSIS — J9600 Acute respiratory failure, unspecified whether with hypoxia or hypercapnia: Secondary | ICD-10-CM

## 2013-06-07 DIAGNOSIS — E1159 Type 2 diabetes mellitus with other circulatory complications: Secondary | ICD-10-CM | POA: Diagnosis present

## 2013-06-07 DIAGNOSIS — I509 Heart failure, unspecified: Secondary | ICD-10-CM

## 2013-06-07 DIAGNOSIS — I5031 Acute diastolic (congestive) heart failure: Secondary | ICD-10-CM

## 2013-06-07 DIAGNOSIS — J4489 Other specified chronic obstructive pulmonary disease: Secondary | ICD-10-CM

## 2013-06-07 LAB — URINALYSIS, ROUTINE W REFLEX MICROSCOPIC
Bilirubin Urine: NEGATIVE
Glucose, UA: >1000 mg/dL — AB
Ketones, ur: NEGATIVE mg/dL
Leukocytes, UA: NEGATIVE
Nitrite: NEGATIVE
Protein, ur: >300 mg/dL — AB
Specific Gravity, Urine: 1.029 (ref 1.005–1.030)
Urobilinogen, UA: 0.2 mg/dL (ref 0.0–1.0)
pH: 7 (ref 5.0–8.0)

## 2013-06-07 LAB — BASIC METABOLIC PANEL
BUN: 16 mg/dL (ref 6–23)
CO2: 24 mEq/L (ref 19–32)
Calcium: 8.5 mg/dL (ref 8.4–10.5)
Chloride: 103 mEq/L (ref 96–112)
Creatinine, Ser: 1.28 mg/dL — ABNORMAL HIGH (ref 0.50–1.10)
GFR calc Af Amer: 51 mL/min — ABNORMAL LOW (ref >90)
GFR calc non Af Amer: 44 mL/min — ABNORMAL LOW (ref >90)
Glucose, Bld: 400 mg/dL — ABNORMAL HIGH (ref 70–99)
Potassium: 3.2 mEq/L — ABNORMAL LOW (ref 3.5–5.1)
Sodium: 140 mEq/L (ref 135–145)

## 2013-06-07 LAB — CBC
HCT: 37.2 % (ref 36.0–46.0)
Hemoglobin: 12.3 g/dL (ref 12.0–15.0)
MCH: 28.5 pg (ref 26.0–34.0)
MCHC: 33.1 g/dL (ref 30.0–36.0)
MCV: 86.1 fL (ref 78.0–100.0)
Platelets: 161 10*3/uL (ref 150–400)
RBC: 4.32 MIL/uL (ref 3.87–5.11)
RDW: 13.5 % (ref 11.5–15.5)
WBC: 9 10*3/uL (ref 4.0–10.5)

## 2013-06-07 LAB — APTT: aPTT: 139 seconds — ABNORMAL HIGH (ref 24–37)

## 2013-06-07 LAB — GLUCOSE, CAPILLARY
Glucose-Capillary: 590 mg/dL (ref 70–99)
Glucose-Capillary: 512 mg/dL — ABNORMAL HIGH (ref 70–99)
Glucose-Capillary: 364 mg/dL — ABNORMAL HIGH (ref 70–99)
Glucose-Capillary: 393 mg/dL — ABNORMAL HIGH (ref 70–99)
Glucose-Capillary: 288 mg/dL — ABNORMAL HIGH (ref 70–99)
Glucose-Capillary: 238 mg/dL — ABNORMAL HIGH (ref 70–99)
Glucose-Capillary: 172 mg/dL — ABNORMAL HIGH (ref 70–99)
Glucose-Capillary: 169 mg/dL — ABNORMAL HIGH (ref 70–99)
Glucose-Capillary: 175 mg/dL — ABNORMAL HIGH (ref 70–99)
Glucose-Capillary: 139 mg/dL — ABNORMAL HIGH (ref 70–99)
Glucose-Capillary: 116 mg/dL — ABNORMAL HIGH (ref 70–99)
Glucose-Capillary: 102 mg/dL — ABNORMAL HIGH (ref 70–99)
Glucose-Capillary: 93 mg/dL (ref 70–99)
Glucose-Capillary: 114 mg/dL — ABNORMAL HIGH (ref 70–99)
Glucose-Capillary: 136 mg/dL — ABNORMAL HIGH (ref 70–99)
Glucose-Capillary: 140 mg/dL — ABNORMAL HIGH (ref 70–99)

## 2013-06-07 LAB — INFLUENZA PANEL BY PCR (TYPE A & B)
H1N1 flu by pcr: NOT DETECTED
Influenza A By PCR: NEGATIVE
Influenza B By PCR: NEGATIVE

## 2013-06-07 LAB — URINE MICROSCOPIC-ADD ON

## 2013-06-07 LAB — RAPID URINE DRUG SCREEN, HOSP PERFORMED
Amphetamines: NOT DETECTED
Barbiturates: NOT DETECTED
Benzodiazepines: NOT DETECTED
Cocaine: NOT DETECTED
Opiates: NOT DETECTED
Tetrahydrocannabinol: NOT DETECTED

## 2013-06-07 LAB — PROCALCITONIN: Procalcitonin: 0.74 ng/mL

## 2013-06-07 LAB — POCT I-STAT 3, ART BLOOD GAS (G3+)
Acid-base deficit: 4 mmol/L — ABNORMAL HIGH (ref 0.0–2.0)
Bicarbonate: 24.2 mEq/L — ABNORMAL HIGH (ref 20.0–24.0)
O2 Saturation: 90 %
Patient temperature: 36.6
TCO2: 26 mmol/L (ref 0–100)
pCO2 arterial: 52.8 mmHg — ABNORMAL HIGH (ref 35.0–45.0)
pH, Arterial: 7.267 — ABNORMAL LOW (ref 7.350–7.450)
pO2, Arterial: 66 mmHg — ABNORMAL LOW (ref 80.0–100.0)

## 2013-06-07 LAB — PHOSPHORUS: Phosphorus: 3.1 mg/dL (ref 2.3–4.6)

## 2013-06-07 LAB — PROTIME-INR
INR: 1.16 (ref 0.00–1.49)
Prothrombin Time: 14.6 seconds (ref 11.6–15.2)

## 2013-06-07 LAB — LACTIC ACID, PLASMA: Lactic Acid, Venous: 4.3 mmol/L — ABNORMAL HIGH (ref 0.5–2.2)

## 2013-06-07 LAB — KETONES, QUALITATIVE: Acetone, Bld: NEGATIVE

## 2013-06-07 LAB — TROPONIN I
Troponin I: 0.49 ng/mL (ref <0.30)
Troponin I: 3.7 ng/mL (ref <0.30)
Troponin I: 3.21 ng/mL (ref <0.30)

## 2013-06-07 LAB — TRIGLYCERIDES: Triglycerides: 95 mg/dL (ref <150)

## 2013-06-07 LAB — TSH: TSH: 4.283 u[IU]/mL (ref 0.350–4.500)

## 2013-06-07 LAB — MAGNESIUM: Magnesium: 1.8 mg/dL (ref 1.5–2.5)

## 2013-06-07 LAB — MRSA PCR SCREENING: MRSA by PCR: NEGATIVE

## 2013-06-07 LAB — PRO B NATRIURETIC PEPTIDE: Pro B Natriuretic peptide (BNP): 2846 pg/mL — ABNORMAL HIGH (ref 0–125)

## 2013-06-07 MED ORDER — HEPARIN (PORCINE) IN NACL 100-0.45 UNIT/ML-% IJ SOLN
850.0000 [IU]/h | INTRAMUSCULAR | Status: DC
  Administered 2013-06-07 – 2013-06-08 (×2): 850 [IU]/h via INTRAVENOUS
  Filled 2013-06-07 (×4): qty 250

## 2013-06-07 MED ORDER — ALBUTEROL SULFATE HFA 108 (90 BASE) MCG/ACT IN AERS
6.0000 | INHALATION_SPRAY | RESPIRATORY_TRACT | Status: DC | PRN
  Filled 2013-06-07: qty 6.7

## 2013-06-07 MED ORDER — FUROSEMIDE 10 MG/ML IJ SOLN
40.0000 mg | Freq: Two times a day (BID) | INTRAMUSCULAR | Status: DC
  Administered 2013-06-07 – 2013-06-08 (×2): 40 mg via INTRAVENOUS
  Filled 2013-06-07 (×4): qty 4

## 2013-06-07 MED ORDER — INSULIN GLARGINE 100 UNIT/ML ~~LOC~~ SOLN
10.0000 [IU] | Freq: Every day | SUBCUTANEOUS | Status: DC
  Administered 2013-06-07 – 2013-06-10 (×4): 10 [IU] via SUBCUTANEOUS
  Filled 2013-06-07 (×7): qty 0.1

## 2013-06-07 MED ORDER — INSULIN ASPART 100 UNIT/ML ~~LOC~~ SOLN: 1.0000 [IU] | SUBCUTANEOUS | Status: DC

## 2013-06-07 MED ORDER — MIDAZOLAM HCL 2 MG/2ML IJ SOLN
2.0000 mg | INTRAMUSCULAR | Status: DC | PRN
  Filled 2013-06-07: qty 2

## 2013-06-07 MED ORDER — PROPOFOL 10 MG/ML IV EMUL
5.0000 ug/kg/min | INTRAVENOUS | Status: DC
  Administered 2013-06-08: 40 ug/kg/min via INTRAVENOUS
  Administered 2013-06-08: 35 ug/kg/min via INTRAVENOUS
  Filled 2013-06-07 (×3): qty 100

## 2013-06-07 MED ORDER — PROPOFOL 10 MG/ML IV EMUL
0.0000 ug/kg/min | INTRAVENOUS | Status: DC
  Administered 2013-06-07: 15 ug/kg/min via INTRAVENOUS

## 2013-06-07 MED ORDER — PROPOFOL 10 MG/ML IV EMUL
5.0000 ug/kg/min | INTRAVENOUS | Status: DC
  Administered 2013-06-07 (×2): 35 ug/kg/min via INTRAVENOUS
  Filled 2013-06-07 (×2): qty 100

## 2013-06-07 MED ORDER — GLUCERNA 1.2 CAL PO LIQD
1000.0000 mL | ORAL | Status: DC
  Administered 2013-06-07: 1000 mL
  Filled 2013-06-07 (×3): qty 1000

## 2013-06-07 MED ORDER — PANTOPRAZOLE SODIUM 40 MG IV SOLR
40.0000 mg | Freq: Every day | INTRAVENOUS | Status: DC
  Administered 2013-06-07 – 2013-06-08 (×2): 40 mg via INTRAVENOUS
  Filled 2013-06-07 (×3): qty 40

## 2013-06-07 MED ORDER — INSULIN GLARGINE 100 UNIT/ML ~~LOC~~ SOLN
10.0000 [IU] | SUBCUTANEOUS | Status: DC
  Administered 2013-06-07: 10 [IU] via SUBCUTANEOUS
  Filled 2013-06-07: qty 0.1

## 2013-06-07 MED ORDER — ALBUTEROL SULFATE HFA 108 (90 BASE) MCG/ACT IN AERS
6.0000 | INHALATION_SPRAY | RESPIRATORY_TRACT | Status: DC
  Administered 2013-06-07 – 2013-06-08 (×3): 6 via RESPIRATORY_TRACT
  Filled 2013-06-07: qty 6.7

## 2013-06-07 MED ORDER — SODIUM CHLORIDE 0.9 % IV SOLN
INTRAVENOUS | Status: DC
  Administered 2013-06-07: 03:00:00 via INTRAVENOUS

## 2013-06-07 MED ORDER — MIDAZOLAM HCL 2 MG/2ML IJ SOLN
INTRAMUSCULAR | Status: AC
  Administered 2013-06-07: 2 mg via INTRAVENOUS
  Filled 2013-06-07: qty 2

## 2013-06-07 MED ORDER — SODIUM CHLORIDE 0.9 % IV SOLN
INTRAVENOUS | Status: DC
  Filled 2013-06-07: qty 1

## 2013-06-07 MED ORDER — PRO-STAT SUGAR FREE PO LIQD
60.0000 mL | Freq: Three times a day (TID) | ORAL | Status: DC
  Administered 2013-06-07 (×2): 60 mL
  Filled 2013-06-07 (×5): qty 60

## 2013-06-07 MED ORDER — BIOTENE DRY MOUTH MT LIQD
15.0000 mL | Freq: Four times a day (QID) | OROMUCOSAL | Status: DC
  Administered 2013-06-07 – 2013-06-08 (×5): 15 mL via OROMUCOSAL

## 2013-06-07 MED ORDER — INSULIN ASPART 100 UNIT/ML ~~LOC~~ SOLN
0.0000 [IU] | SUBCUTANEOUS | Status: DC
  Administered 2013-06-07 (×2): 2 [IU] via SUBCUTANEOUS
  Administered 2013-06-08 (×2): 3 [IU] via SUBCUTANEOUS
  Administered 2013-06-08: 5 [IU] via SUBCUTANEOUS
  Administered 2013-06-08 – 2013-06-09 (×3): 3 [IU] via SUBCUTANEOUS

## 2013-06-07 MED ORDER — POTASSIUM CHLORIDE 20 MEQ/15ML (10%) PO LIQD
20.0000 meq | ORAL | Status: AC
  Administered 2013-06-07 (×2): 20 meq
  Filled 2013-06-07 (×2): qty 15

## 2013-06-07 MED ORDER — MIDAZOLAM HCL 2 MG/2ML IJ SOLN
2.0000 mg | INTRAMUSCULAR | Status: DC | PRN
  Administered 2013-06-07 (×3): 2 mg via INTRAVENOUS
  Filled 2013-06-07: qty 2

## 2013-06-07 MED ORDER — ASPIRIN 81 MG PO CHEW: 324.0000 mg | CHEWABLE_TABLET | ORAL | Status: AC

## 2013-06-07 MED ORDER — ALBUTEROL SULFATE (5 MG/ML) 0.5% IN NEBU: 2.5000 mg | INHALATION_SOLUTION | RESPIRATORY_TRACT | Status: DC | PRN

## 2013-06-07 MED ORDER — FENTANYL CITRATE 0.05 MG/ML IJ SOLN
100.0000 ug | INTRAMUSCULAR | Status: DC | PRN
  Administered 2013-06-07 (×2): 100 ug via INTRAVENOUS
  Filled 2013-06-07 (×2): qty 2

## 2013-06-07 MED ORDER — HEPARIN SODIUM (PORCINE) 5000 UNIT/ML IJ SOLN
5000.0000 [IU] | Freq: Three times a day (TID) | INTRAMUSCULAR | Status: DC
  Administered 2013-06-07 (×2): 5000 [IU] via SUBCUTANEOUS
  Filled 2013-06-07 (×4): qty 1

## 2013-06-07 MED ORDER — INSULIN ASPART 100 UNIT/ML ~~LOC~~ SOLN: 2.0000 [IU] | SUBCUTANEOUS | Status: DC

## 2013-06-07 MED ORDER — HEPARIN BOLUS VIA INFUSION
4000.0000 [IU] | Freq: Once | INTRAVENOUS | Status: AC
  Administered 2013-06-07: 4000 [IU] via INTRAVENOUS
  Filled 2013-06-07: qty 4000

## 2013-06-07 MED ORDER — PROPOFOL 10 MG/ML IV EMUL
INTRAVENOUS | Status: AC
  Filled 2013-06-07: qty 100

## 2013-06-07 MED ORDER — DEXTROSE 10 % IV SOLN: INTRAVENOUS | Status: DC | PRN

## 2013-06-07 MED ORDER — IPRATROPIUM BROMIDE HFA 17 MCG/ACT IN AERS
6.0000 | INHALATION_SPRAY | RESPIRATORY_TRACT | Status: DC
  Administered 2013-06-07 – 2013-06-08 (×3): 6 via RESPIRATORY_TRACT
  Filled 2013-06-07: qty 12.9

## 2013-06-07 MED ORDER — CHLORHEXIDINE GLUCONATE 0.12 % MT SOLN
15.0000 mL | Freq: Two times a day (BID) | OROMUCOSAL | Status: DC
  Administered 2013-06-07 – 2013-06-08 (×3): 15 mL via OROMUCOSAL
  Filled 2013-06-07 (×3): qty 15

## 2013-06-07 MED ORDER — CARVEDILOL 3.125 MG PO TABS
3.1250 mg | ORAL_TABLET | Freq: Two times a day (BID) | ORAL | Status: DC
  Administered 2013-06-08 – 2013-06-10 (×5): 3.125 mg via ORAL
  Filled 2013-06-07 (×7): qty 1

## 2013-06-07 MED ORDER — ASPIRIN 300 MG RE SUPP
300.0000 mg | RECTAL | Status: AC
  Administered 2013-06-07: 300 mg via RECTAL
  Filled 2013-06-07: qty 1

## 2013-06-07 NOTE — Progress Notes (Signed)
UR Completed.  [name] [id number] 06/07/2013

## 2013-06-07 NOTE — Progress Notes (Signed)
ANTICOAGULATION CONSULT NOTE - Initial Consult  Pharmacy Consult for heparin Indication: chest pain/ACS  Allergies  Allergen Reactions  . Atorvastatin     REACTION: aches and pains  . Enalapril Maleate     REACTION: cough  . Hydrochlorothiazide     REACTION: hair loss  . Propoxyphene-Acetaminophen   . Simvastatin     REACTION: cramps  . Spironolactone     REACTION: cramps    Patient Measurements: Weight: 195 lb 15.8 oz (88.9 kg) Heparin Dosing Weight: 70kg (based on height of 61 inches from 2011)  Vital Signs: Temp: 89.6 F (32 C) (12/03 1600) BP: 111/64 mmHg (12/03 1600) Pulse Rate: 96 (12/03 1600)  Labs:  Recent Labs  06/06/13 2248 06/06/13 2307 06/07/13 0340 06/07/13 1450  HGB 15.7* 15.6* 12.3  --   HCT 46.4* 46.0 37.2  --   PLT 222  --  161  --   CREATININE 0.88 1.10 1.28*  --   TROPONINI  --   --  0.49* 3.70*    The CrCl is unknown because both a height and weight (above a minimum accepted value) are required for this calculation.   Medical History: Past Medical History  Diagnosis Date  . Anemia   . Personal history of colon cancer   . Diabetes mellitus   . Hyperlipidemia   . Hypertension   . Low back pain   . Obesity   . COPD (chronic obstructive pulmonary disease)   . Renal insufficiency     Assessment: 51 YOF admitted with bilateral pulmonary infiltrates and acute respiratory failure now with demand ischemia vs NSTEMI and elevated troponins to start heparin. She was on subq heparin injections for DVT prophylaxis which have been stopped- last dose received at ~1530. No other anticoagulants noted. Home medications unable to be verified though patient does not have history indicating she would be on an anticoagulant. H/H and platelets are all WNL. No overt bleeding noted per chart review.  Goal of Therapy:  Heparin level 0.3-0.7 units/ml Monitor platelets by anticoagulation protocol: Yes   Plan:  1. Stat aPTT, PT/INR for baseline levels 2.  Bolus with 4000 units IV heparin x1 3. Start heparin drip at 850 units/hr (~12units/kg/hr) 4. Check 6 hour heparin level 5. Daily heparin level and CBC 6. Follow for any signs/symptoms of bleeding 7. Follow up plans for /results of cath  Joeanna Howdyshell D. Suni Jarnagin, PharmD, BCPS Clinical Pharmacist Pager: [phone] 06/07/2013 5:37 PM

## 2013-06-07 NOTE — ED Provider Notes (Signed)
I saw and evaluated the patient, reviewed the resident's note and I agree with the findings and plan.   Houston Siren, MD 06/07/13 1159

## 2013-06-07 NOTE — H&P (Signed)
PULMONARY  / CRITICAL CARE MEDICINE  Name: [name] MRN: [id number] DOB: 28-Sep-1950    ADMISSION DATE:  06/06/2013  PRIMARY SERVICE: PCCM  CHIEF COMPLAINT:  Respiratory distress  BRIEF PATIENT DESCRIPTION: 62 yo woman with DM, HTN, reported COPD, hx colon CA.  Admitted with combined resp failure, VDRF, in setting B infiltrates, probable cardiogenic pulmonary edema  SIGNIFICANT EVENTS / STUDIES:  Head CT 12/3 >> no acute abnormality  LINES / TUBES: ETT 12/2 >>   CULTURES: Blood 12/3 >>  Resp 12/3 >>  Influenza 12/3 >>   ANTIBIOTICS:  HISTORY OF PRESENT ILLNESS:  62 year old smoker with history of diabetes, hypertension, colon cancer and reported COPD. She was last seen well by a friend on 11/29. On 12/2 she was noted by her neighbor to be in respiratory distress. She reportedly was unable to speak due to work of breathing. EMS was activated and on their arrival she was hypoxemic with evolving respiratory failure. In the [facility] emergency department she was barely unresponsive, hypertensive to 240-250 mmHg SBP. CXR revealed bilateral interstitial and alveolar infiltrates. CBG was > 600. She required intubation and mechanical ventilation. Blood pressure was controlled once IV sedation was initiated. She received Lasix 60 mg with 800 cc diuresis. Chart notes indicate that she has no health insurance and has not followed with Dr Alain [place] since 2011. She has been attempting to reestablish care due to dyspnea. It is unclear whether she has been taking her list of medications. Her closest friend is present and is unsure whether the patient is compliant.    PAST MEDICAL HISTORY :  Past Medical History  Diagnosis Date  . Anemia   . Personal history of colon cancer   . Diabetes mellitus   . Hyperlipidemia   . Hypertension   . Low back pain   . Obesity   . COPD (chronic obstructive pulmonary disease)   . Renal insufficiency    Past Surgical History  Procedure Laterality Date  .  Colectomy     Prior to Admission medications   Medication Sig Start Date End Date Taking? Authorizing Provider  albuterol (PROAIR HFA) 108 (90 BASE) MCG/ACT inhaler Inhale 2 puffs into the lungs every 4 (four) hours as needed.      Historical Provider, MD  amLODipine-valsartan (EXFORGE) 5-320 MG per tablet Take 1 tablet by mouth daily.      Historical Provider, MD  Chlorpheniramine-Phenylephrine (MAXICHLOR PEH) 4-10 MG per tablet Take 1 tablet by mouth 2 (two) times daily as needed.      Historical Provider, MD  Cholecalciferol (VITAMIN D3) 1000 UNIT tablet Take 1,000 Units by mouth daily.      Historical Provider, MD  cyanocobalamin 500 MCG tablet Take 500 mcg by mouth daily.      Historical Provider, MD  esomeprazole (NEXIUM) 40 MG capsule Take 40 mg by mouth every morning.      Historical Provider, MD  ferrous sulfate (FERRO-BOB) 325 (65 FE) MG tablet Take 325 mg by mouth daily.      Historical Provider, MD  furosemide (LASIX) 80 MG tablet Take 40 mg by mouth daily.      Historical Provider, MD  glimepiride (AMARYL) 4 MG tablet Take 4 mg by mouth 2 (two) times daily.      Historical Provider, MD  promethazine-codeine (PHENERGAN WITH CODEINE) 6.25-10 MG/5ML syrup Take 5-10 mLs by mouth 4 (four) times daily as needed.      Historical Provider, MD  sitaGLIPtan-metformin (JANUMET) 50-1000 MG per tablet Take  1 tablet by mouth 2 (two) times daily.      Historical Provider, MD   Allergies  Allergen Reactions  . Atorvastatin     REACTION: aches and pains  . Enalapril Maleate     REACTION: cough  . Hydrochlorothiazide     REACTION: hair loss  . Propoxyphene-Acetaminophen   . Simvastatin     REACTION: cramps  . Spironolactone     REACTION: cramps    FAMILY HISTORY:  Family History  Problem Relation Age of Onset  . Hypertension     SOCIAL HISTORY:  reports that she has been smoking.  She does not have any smokeless tobacco history on file. Her alcohol and drug histories are not on  file.  REVIEW OF SYSTEMS:  Unable to obtain due to MS  SUBJECTIVE:  Unable to obtain  VITAL SIGNS: Temp:  [96.6 F (35.9 C)-97.1 F (36.2 C)] 96.6 F (35.9 C) (12/03 0218) Pulse Rate:  [88-149] 111 (12/03 0218) Resp:  [14-26] 23 (12/03 0218) BP: (109-241)/(71-145) 123/76 mmHg (12/03 0218) SpO2:  [56 %-100 %] 100 % (12/03 0218) FiO2 (%):  [60 %-100 %] 70 % (12/03 0135) Weight:  [88.9 kg (195 lb 15.8 oz)] 88.9 kg (195 lb 15.8 oz) (12/03 0200) HEMODYNAMICS:   VENTILATOR SETTINGS: Vent Mode:  [-] PRVC FiO2 (%):  [60 %-100 %] 70 % Set Rate:  [12 bmp-24 bmp] 24 bmp Vt Set:  [420 mL-460 mL] 460 mL PEEP:  [5 cmH20-6 cmH20] 5 cmH20 Pressure Support:  [16 cmH20] 16 cmH20 Plateau Pressure:  [27 cmH20] 27 cmH20 INTAKE / OUTPUT: Intake/Output     12/02 0701 - 12/03 0700   Urine (mL/kg/hr) 850   Total Output 850   Net -850         PHYSICAL EXAMINATION: General: Unresponsive, intubated, ill appearing Neuro:  Unresponsive to voice or stim (after meds for intubation), slight spont movement B UE's HEENT:  ETT with frothy secretions, pupils 26mm and sluggish Cardiovascular:  Regular, no M Lungs:  B insp crackles with coarse expiration, no wheeze Abdomen:  Soft, no apparent tenderness Musculoskeletal:  1+ pretibial edema Skin:  Cool extremities, no rash  LABS:  CBC  Recent Labs Lab 06/06/13 2248 06/06/13 2307  WBC 12.5*  --   HGB 15.7* 15.6*  HCT 46.4* 46.0  PLT 222  --    Coag's No results found for this basename: APTT, INR,  in the last 168 hours BMET  Recent Labs Lab 06/06/13 2248 06/06/13 2307  NA 131* 136  K 3.2* 3.2*  CL 94* 99  CO2 20  --   BUN 14 17  CREATININE 0.88 1.10  GLUCOSE 648* 664*   Electrolytes  Recent Labs Lab 06/06/13 2248  CALCIUM 8.3*   Sepsis Markers No results found for this basename: LATICACIDVEN, PROCALCITON, O2SATVEN,  in the last 168 hours ABG  Recent Labs Lab 06/06/13 2352 06/07/13 0133  PHART 7.154* 7.267*  PCO2ART  75.7* 52.8*  PO2ART 313.0* 66.0*   Liver Enzymes  Recent Labs Lab 06/06/13 2248  AST 68*  ALT 31  ALKPHOS 224*  BILITOT 0.2*  ALBUMIN 2.8*   Cardiac Enzymes  Recent Labs Lab 06/06/13 2245  PROBNP 1477.0*   Glucose  Recent Labs Lab 06/06/13 2259 06/07/13 0207  GLUCAP 590* 512*    Imaging Ct Head Wo Contrast  06/07/2013   CLINICAL DATA:  Respiratory distress and altered mental status. Patient on ventilator.  EXAM: CT HEAD WITHOUT CONTRAST  TECHNIQUE: Contiguous axial images were  obtained from the base of the skull through the vertex without intravenous contrast.  COMPARISON:  None.  FINDINGS: Sinuses/Soft tissues: Probable secretions within the nasopharynx, related to endotracheal tube or nasogastric tube. No significant soft tissue swelling. Clear paranasal sinuses and mastoid air cells.  Intracranial: No mass lesion, hemorrhage, hydrocephalus, acute infarct, intra-axial, or extra-axial fluid collection.  IMPRESSION: No acute intracranial abnormality.   Electronically Signed   By: Abigail Miyamoto M.D.   On: 06/07/2013 00:02   Dg Chest Portable 1 View  06/06/2013   CLINICAL DATA:  Endotracheal tube, nonresponsive  EXAM: PORTABLE CHEST - 1 VIEW  COMPARISON:  Prior radiograph from 4/9/2 IX  FINDINGS: Endotracheal tube is in place with tip located 6.4 cm above the carinal. Enteric tube courses into the abdomen.  Transverse heart size is stable as compared to the prior examination, and remains within normal limits. There are increased atherosclerotic calcifications within the aortic arch as compared to prior. Mediastinal silhouette is within normal limits.  Lung volumes are normal. There is diffuse pulmonary vascular congestion with interstitial thickening, suggestive of pulmonary interstitial edema. Superimposed infection is not excluded. No pleural effusion. No pneumothorax.  Osseous structures are within normal limits.  IMPRESSION: 1. Tip of the endotracheal tube 6.4 cm above the  carinal. 2. Extensive bilateral interstitial lung opacities, suggestive of pulmonary interstitial edema.   Electronically Signed   By: Jeannine Boga M.D.   On: 06/06/2013 23:27    CXR: 12/2 > Diffuse B interstitial infiltrates consistent with pulmonary edema  ASSESSMENT / PLAN:  PULMONARY A: Acute hypercapnic and hypoxemic respiratory failure Acute bilateral infiltrates consistent with cardiogenic pulmonary edema Presumed COPD P:   - PRVC 8 cc/kg - Diuresis and blood pressure control as below - bronchodilators when necessary, consider scheduling - Will need PFTs after this acute illness resolves  CARDIOVASCULAR A: Hypertensive emergency  Acute cardiogenic pulmonary edema P:  - continue diuresis, Lasix 80 mg every 12 hours - follow BNP - TTE to evaluate LV systolic and diastolic function - ECG now - Follow serial troponin - if no quick resolution infiltrates or if volume status in question then will place CVC to obtain CVPs  RENAL A:  Hypokalemia  Metabolic acidosis, anion gap 17 P:   - replace potassium - Follow BMP closely with diuresis - check lactic acid  GASTROINTESTINAL A:  SUP P:   - PPI  HEMATOLOGIC A:  No acute issues P:  - follow CBC  INFECTIOUS A:  Bilateral infiltrates, suspect edema. Less likely consider CAP or viral pneumonia P:   - defer antibiotics at this time. Low threshold to start if clinical evidence for infection - Check blood and respiratory cultures - Check influenza screen  ENDOCRINE A:  Severe hyperglycemia, possible DKA (mixed acidosis)   P:   - insulin drip initiated but have deferred DKA protocol given the mixed nature of her acidosis and an effort to minimize IV fluids in the setting of pulmonary edema and respiratory failure - If her acidosis worsens or if evidence volume depletion then may need to start DKA protocol and accept volume excess short term.   NEUROLOGIC A:  Acute encephalopathy, toxic metabolic,  hypercapnic P:   - sedation protocol ordered  TODAY'S SUMMARY:   I have personally obtained a history, examined the patient, evaluated laboratory and imaging results, formulated the assessment and plan and placed orders. CRITICAL CARE: The patient is critically ill with multiple organ systems failure and requires high complexity decision making for assessment  and support, frequent evaluation and titration of therapies, application of advanced monitoring technologies and extensive interpretation of multiple databases. Critical Care Time devoted to patient care services described in this note is 60  minutes.    Baltazar Apo, MD, PhD 06/07/2013, 2:53 AM [facility] [phone] or if no answer [phone]

## 2013-06-07 NOTE — Progress Notes (Signed)
[facility] ICU Electrolyte Replacement Protocol  Patient Name: ANISHIA BEAULIEU DOB: 06/05/1951 MRN: [id number]  Date of Service  06/07/2013   HPI/Events of Note    Recent Labs Lab 06/06/13 2248 06/06/13 2307 06/07/13 0340  NA 131* 136 140  K 3.2* 3.2* 3.2*  CL 94* 99 103  CO2 20  --  24  GLUCOSE 648* 664* 400*  BUN 14 17 16   CREATININE 0.88 1.10 1.28*  CALCIUM 8.3*  --  8.5  MG  --   --  1.8  PHOS  --   --  3.1    The CrCl is unknown because both a height and weight (above a minimum accepted value) are required for this calculation.  Intake/Output     12/02 0701 - 12/03 0700   I.V. (mL/kg) 69.6 (0.8)   NG/GT 30   Total Intake(mL/kg) 99.6 (1.1)   Urine (mL/kg/hr) 990   Total Output 990   Net -890.4        - I/O DETAILED x24h    Total I/O In: 99.6 [I.V.:69.6; NG/GT:30] Out: 990 [Urine:990] - I/O THIS SHIFT    ASSESSMENT   eICURN Interventions  K+ 3.2 Replace using ICU electrolyte protocol   ASSESSMENT: MAJOR ELECTROLYTE    Lorene Dy 06/07/2013, 5:08 AM

## 2013-06-07 NOTE — ED Notes (Signed)
According to EMS, patient was at home and a neighbor heard the patient in distress and called EMS.  EMS placed an 18 gauge in her Left/AC and placed her on CPAP.  The patient was transported here to be evaluated.

## 2013-06-07 NOTE — Progress Notes (Signed)
Pt placed back on full support due to low Vte. Pt tolerated PSV 5/10 all day. SBT in am to reassess for extubation. RT will continue to monitor.

## 2013-06-07 NOTE — Progress Notes (Addendum)
INITIAL NUTRITION ASSESSMENT  DOCUMENTATION CODES Per approved criteria  -Obesity Unspecified   INTERVENTION:  Initiate TF via OGT with Glucerna 1.2 at 10 ml/h, goal rate, with Prostat 60 ml TID to provide 888 kcals, 104 gm protein, 193 ml free water daily.  Above TF regimen plus current Propofol rate will provide a total of 1382 kcals (74% of estimated nutrition needs).   NUTRITION DIAGNOSIS: Inadequate oral intake related to inability to eat as evidenced by NPO status.   Goal: Intake to meet >90% of estimated nutrition needs.  Monitor:  TF tolerance/adequacy, weight trend, labs, vent status.  Reason for Assessment: MD Consult for TF initiation and management.  62 y.o. female  Admitting Dx: Respiratory Distress  ASSESSMENT: 62 year old smoker with history of diabetes, hypertension, colon cancer and reported COPD. She was last seen well by a friend on 11/29. On 12/2 she was noted by her neighbor to be in respiratory distress. She reportedly was unable to speak due to work of breathing. EMS was activated and on their arrival she was hypoxemic with evolving respiratory failure. In the [facility] emergency department she was barely unresponsive, hypertensive to 240-250 mmHg SBP. CXR revealed bilateral interstitial and alveolar infiltrates. CBG was > 600. She required intubation and mechanical ventilation.   Discussed patient in ICU rounds today. Patient is on glucostabilizer. RN is hopeful to transition off insulin drip after the next 2 glucose readings. Plans to start TF today via OGT, RD to order TF. Received MD Consult for TF initiation and management. Nutrition focused physical exam completed.  No muscle or subcutaneous fat depletion noticed.  Patient is currently intubated on ventilator support.  MV: 11.8 L/min Temp:Temp (24hrs), Avg:98.2 F (36.8 C), Min:95.4 F (35.2 C), Max:99.6 F (37.6 C)  Propofol: 18.7 ml/hr providing 494 kcals per day.   Height: Ht Readings from Last 1  Encounters:  05/21/10 5\' 2"  (1.575 m)    Weight: Wt Readings from Last 1 Encounters:  06/07/13 195 lb 15.8 oz (88.9 kg)    Ideal Body Weight: 50 kg  % Ideal Body Weight: 178%  Wt Readings from Last 10 Encounters:  06/07/13 195 lb 15.8 oz (88.9 kg)  05/21/10 221 lb (100.245 kg)  01/07/10 224 lb (101.606 kg)  08/26/09 224 lb (101.606 kg)  04/18/09 219 lb (99.338 kg)  12/19/08 216 lb (97.977 kg)  09/11/08 219 lb (99.338 kg)  06/13/08 220 lb (99.791 kg)  02/09/08 219 lb (99.338 kg)  10/13/07 207 lb (93.895 kg)    Usual Body Weight: 221 lb 3 years ago  % Usual Body Weight: 88%  BMI:  Body mass index is 35.84 kg/(m^2).  Estimated Nutritional Needs: Kcal: 1865 Protein: 100 gm Fluid: 1.8-2 L  Skin: no wounds  Diet Order: NPO  EDUCATION NEEDS: -Education not appropriate at this time   Intake/Output Summary (Last 24 hours) at 06/07/13 1146 Last data filed at 06/07/13 1100  Gross per 24 hour  Intake 353.87 ml  Output   1010 ml  Net -656.13 ml    Last BM: PTA   Labs:   Recent Labs Lab 06/06/13 2248 06/06/13 2307 06/07/13 0340  NA 131* 136 140  K 3.2* 3.2* 3.2*  CL 94* 99 103  CO2 20  --  24  BUN 14 17 16   CREATININE 0.88 1.10 1.28*  CALCIUM 8.3*  --  8.5  MG  --   --  1.8  PHOS  --   --  3.1  GLUCOSE 648* 664* 400*  CBG (last 3)   Recent Labs  06/07/13 0359 06/07/13 0511 06/07/13 0607  GLUCAP 364* 393* 288*    Scheduled Meds: . antiseptic oral rinse  15 mL Mouth Rinse QID  . chlorhexidine  15 mL Mouth Rinse BID  . heparin  5,000 Units Subcutaneous Q8H  . pantoprazole (PROTONIX) IV  40 mg Intravenous QHS    Continuous Infusions: . sodium chloride 10 mL/hr at 06/07/13 0238  . insulin (NOVOLIN-R) infusion 10.6 Units/hr (06/07/13 0141)  . insulin (NOVOLIN-R) infusion 2.4 Units/hr (06/07/13 1100)  . propofol 35 mcg/kg/min (06/07/13 [id number])    Past Medical History  Diagnosis Date  . Anemia   . Personal history of colon cancer   .  Diabetes mellitus   . Hyperlipidemia   . Hypertension   . Low back pain   . Obesity   . COPD (chronic obstructive pulmonary disease)   . Renal insufficiency     Past Surgical History  Procedure Laterality Date  . Colectomy      Molli Barrows, RD, LDN, [place] Pager [phone] After Hours Pager [phone]

## 2013-06-07 NOTE — Progress Notes (Signed)
Echo Lab  2D Echocardiogram completed.  [place], RDCS 06/07/2013 12:41 PM

## 2013-06-07 NOTE — Progress Notes (Signed)
CRITICAL VALUE ALERT  Critical value received:  Troponin .49  Date of notification:  06/07/13  Time of notification:  0526  Critical value read back:yes  Nurse who received alert:  Camaria Gerald rn  MD notified (1st page):  deterding elink  Time of first page:  0553  MD notified (2nd page):na  Time of second page:na  Responding MD:  deterding  Time MD responded:  [phone]

## 2013-06-07 NOTE — Progress Notes (Signed)
PULMONARY  / CRITICAL CARE MEDICINE  Name: [name] MRN: [id number] DOB: 30-Mar-1951    ADMISSION DATE:  06/06/2013 CONSULTATION DATE:  06/06/2013  PRIMARY SERVICE: PCCM  CHIEF COMPLAINT:  Respiratory distress  BRIEF PATIENT DESCRIPTION: 62 yo with COPD and CHF admitted with bilateral pulmonary infiltrates and acute respiratory failure.  SIGNIFICANT EVENTS / STUDIES:  06/30/23  CT head >>> nad June 30, 2023  TTE >>> LFH, EF 99991111, diastolic dysfunction  LINES / TUBES: OETT 12/02 >>> OGT  12/02 >>> Foley  12/02 >>>  CULTURES: 2023/06/30  Blood >>>  2023-06-30  Respiratory  >>>   ANTIBIOTICS:  SUBJECTIVE:  Hyperglycemic.  Large amount of tracheal secretions per RN.  VITAL SIGNS: Temp:  [89.6 F (32 C)-99.6 F (37.6 C)] 89.6 F (32 C) 2023-06-30 1600) Pulse Rate:  [87-149] 96 06-30-23 1600) Resp:  [9-36] 15 2023-06-30 1600) BP: (67-241)/(45-145) 111/64 mmHg June 30, 2023 1600) SpO2:  [56 %-100 %] 99 % 06/30/23 1600) FiO2 (%):  [40 %-100 %] 40 % 2023-06-30 1159) Weight:  [88.9 kg (195 lb 15.8 oz)] 88.9 kg (195 lb 15.8 oz) June 30, 2023 0200) HEMODYNAMICS:   VENTILATOR SETTINGS: Vent Mode:  [-] PSV FiO2 (%):  [40 %-100 %] 40 % Set Rate:  [12 bmp-24 bmp] 24 bmp Vt Set:  [420 mL-460 mL] 460 mL PEEP:  [5 cmH20-6 cmH20] 5 cmH20 Pressure Support:  [10 cmH20-16 cmH20] 10 cmH20 Plateau Pressure:  [16 cmH20-27 cmH20] 16 cmH20 INTAKE / OUTPUT: Intake/Output     12/02 0701 - 2023/06/30 0700 06/30/2023 0701 - 12/04 0700   I.V. (mL/kg) 175.9 (2) 233.9 (2.6)   NG/GT 60 30   Total Intake(mL/kg) 235.9 (2.7) 263.9 (3)   Urine (mL/kg/hr) 1010 95 (0.1)   Total Output 1010 95   Net -774.1 +168.9          PHYSICAL EXAMINATION: General:  Appears acutely ill, mechanically ventilated, synchronous Neuro:  Encephalopathic, nonfocal, cough / gag diminished HEENT:  PERRL, OETT / OGT, large amount of yellow secretions with sx Cardiovascular:  RRR, no m/r/g Lungs:  Bilateral air entry, no wheezes, scattered rhonchi Abdomen:  Soft,  nontender, bowel sounds diminished Musculoskeletal:  Trace edema Skin:  Intact  LABS:  CBC  Recent Labs Lab 06/06/13 2248 06/06/13 2307 2013-06-29 0340  WBC 12.5*  --  9.0  HGB 15.7* 15.6* 12.3  HCT 46.4* 46.0 37.2  PLT 222  --  161   Coag's No results found for this basename: APTT, INR,  in the last 168 hours BMET  Recent Labs Lab 06/06/13 2248 06/06/13 2307 06/29/2013 0340  NA 131* 136 140  K 3.2* 3.2* 3.2*  CL 94* 99 103  CO2 20  --  24  BUN 14 17 16   CREATININE 0.88 1.10 1.28*  GLUCOSE 648* 664* 400*   Electrolytes  Recent Labs Lab 06/06/13 2248 29-Jun-2013 0340  CALCIUM 8.3* 8.5  MG  --  1.8  PHOS  --  3.1   Sepsis Markers  Recent Labs Lab 2013/06/29 0340  LATICACIDVEN 4.3*  PROCALCITON 0.74   ABG  Recent Labs Lab 06/06/13 2352 06/29/2013 0133  PHART 7.154* 7.267*  PCO2ART 75.7* 52.8*  PO2ART 313.0* 66.0*   Liver Enzymes  Recent Labs Lab 06/06/13 2248  AST 68*  ALT 31  ALKPHOS 224*  BILITOT 0.2*  ALBUMIN 2.8*   Cardiac Enzymes  Recent Labs Lab 06/06/13 2245 2013/06/29 0340 06/29/13 1450  TROPONINI  --  0.49* 3.70*  PROBNP 1477.0* 2846.0*  --    Glucose  Recent  Labs Lab 06/07/13 0930 06/07/13 1017 06/07/13 1117 06/07/13 1202 06/07/13 1307 06/07/13 1410  GLUCAP 175* 169* 139* 116* 102* 93   CXR: None today  ASSESSMENT / PLAN:  PULMONARY A: COPD without evidence of exacerbation / acute bronchospasm.  Acute pulmonary edema.  Acute respiratory failure. 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 Hypertensive emergency.  Demand ischemia vs NSTEMI. P:  Goal BP<140/80 ASA Start Heparin per ACS protocol Trend Troponin Start Coreg 3.25 bid No Lipitor / Lisinopril - intolerance Likely need cardiac cath prior to d/c   RENAL A:   AKI.  Hypokalemia. P:   Trend BMP Lasix 40 bid K 40 x 2  GASTROINTESTINAL A:  No active issues. P:   NPO TF per Nutritionist Protonix for GI Px  HEMATOLOGIC A:  Therapeutic anticoagulation for ACS. P:  D/c Heparin [place] as on gtt  INFECTIOUS A:  Doubt pneumonia as WBC / PCT reassuring. P:   Defer abx  ENDOCRINE A:  DM.  Hyperglycemia - resolving.  Suspected DKA  P:   Ketones D/c Insulin gtt SSI Lantus 10  NEUROLOGIC A:  Acute encephalopathy. P:   Goal RASS 0 to -1 Propofol gtt  I have personally obtained history, examined patient, evaluated and interpreted laboratory and imaging results, reviewed medical records, formulated assessment / plan and placed orders.  CRITICAL CARE:  The patient is critically ill with multiple organ systems failure and requires high complexity decision making for assessment and support, frequent evaluation and titration of therapies, application of advanced monitoring technologies and extensive interpretation of multiple databases. Critical Care Time devoted to patient care services described in this note is 45 minutes.   Doree Fudge, MD Pulmonary and [place] Pager: [phone]  06/07/2013, 5:35 PM

## 2013-06-08 ENCOUNTER — Inpatient Hospital Stay (HOSPITAL_COMMUNITY): Payer: Self-pay

## 2013-06-08 DIAGNOSIS — I5041 Acute combined systolic (congestive) and diastolic (congestive) heart failure: Secondary | ICD-10-CM

## 2013-06-08 LAB — BASIC METABOLIC PANEL
BUN: 23 mg/dL (ref 6–23)
CO2: 24 mEq/L (ref 19–32)
Calcium: 8.4 mg/dL (ref 8.4–10.5)
Chloride: 105 mEq/L (ref 96–112)
Creatinine, Ser: 1.35 mg/dL — ABNORMAL HIGH (ref 0.50–1.10)
GFR calc Af Amer: 48 mL/min — ABNORMAL LOW (ref >90)
GFR calc non Af Amer: 41 mL/min — ABNORMAL LOW (ref >90)
Glucose, Bld: 200 mg/dL — ABNORMAL HIGH (ref 70–99)
Potassium: 3.3 mEq/L — ABNORMAL LOW (ref 3.5–5.1)
Sodium: 140 mEq/L (ref 135–145)

## 2013-06-08 LAB — GLUCOSE, CAPILLARY
Glucose-Capillary: 103 mg/dL — ABNORMAL HIGH (ref 70–99)
Glucose-Capillary: 150 mg/dL — ABNORMAL HIGH (ref 70–99)
Glucose-Capillary: 169 mg/dL — ABNORMAL HIGH (ref 70–99)
Glucose-Capillary: 190 mg/dL — ABNORMAL HIGH (ref 70–99)
Glucose-Capillary: 195 mg/dL — ABNORMAL HIGH (ref 70–99)
Glucose-Capillary: 205 mg/dL — ABNORMAL HIGH (ref 70–99)
Glucose-Capillary: 207 mg/dL — ABNORMAL HIGH (ref 70–99)

## 2013-06-08 LAB — BLOOD GAS, ARTERIAL
Acid-Base Excess: 1.2 mmol/L (ref 0.0–2.0)
Bicarbonate: 24.8 mEq/L — ABNORMAL HIGH (ref 20.0–24.0)
Drawn by: 40415
FIO2: 0.3 %
MECHVT: 460 mL
O2 Saturation: 97.6 %
PEEP: 5 cmH2O
Patient temperature: 100.3
RATE: 24 resp/min
TCO2: 25.9 mmol/L (ref 0–100)
pCO2 arterial: 37.6 mmHg (ref 35.0–45.0)
pH, Arterial: 7.439 (ref 7.350–7.450)
pO2, Arterial: 104 mmHg — ABNORMAL HIGH (ref 80.0–100.0)

## 2013-06-08 LAB — CBC
HCT: 34.5 % — ABNORMAL LOW (ref 36.0–46.0)
Hemoglobin: 11.3 g/dL — ABNORMAL LOW (ref 12.0–15.0)
MCH: 27.9 pg (ref 26.0–34.0)
MCHC: 32.8 g/dL (ref 30.0–36.0)
MCV: 85.2 fL (ref 78.0–100.0)
Platelets: 132 10*3/uL — ABNORMAL LOW (ref 150–400)
RBC: 4.05 MIL/uL (ref 3.87–5.11)
RDW: 13.8 % (ref 11.5–15.5)
WBC: 7.6 10*3/uL (ref 4.0–10.5)

## 2013-06-08 LAB — HEPARIN LEVEL (UNFRACTIONATED)
Heparin Unfractionated: 0.44 IU/mL (ref 0.30–0.70)
Heparin Unfractionated: 0.41 IU/mL (ref 0.30–0.70)

## 2013-06-08 LAB — TROPONIN I
Troponin I: 3.03 ng/mL (ref <0.30)
Troponin I: 0.93 ng/mL (ref <0.30)

## 2013-06-08 LAB — PROCALCITONIN: Procalcitonin: 1.45 ng/mL

## 2013-06-08 MED ORDER — IPRATROPIUM BROMIDE 0.02 % IN SOLN
0.5000 mg | RESPIRATORY_TRACT | Status: DC
  Administered 2013-06-08 – 2013-06-10 (×11): 0.5 mg via RESPIRATORY_TRACT
  Filled 2013-06-08 (×11): qty 2.5

## 2013-06-08 MED ORDER — ALBUTEROL SULFATE (5 MG/ML) 0.5% IN NEBU
2.5000 mg | INHALATION_SOLUTION | RESPIRATORY_TRACT | Status: DC
  Administered 2013-06-08 – 2013-06-10 (×11): 2.5 mg via RESPIRATORY_TRACT
  Filled 2013-06-08 (×11): qty 0.5

## 2013-06-08 MED ORDER — ALBUTEROL SULFATE (5 MG/ML) 0.5% IN NEBU: 2.5000 mg | INHALATION_SOLUTION | RESPIRATORY_TRACT | Status: DC | PRN

## 2013-06-08 MED ORDER — POTASSIUM CHLORIDE 10 MEQ/100ML IV SOLN
10.0000 meq | INTRAVENOUS | Status: AC
  Administered 2013-06-08 (×4): 10 meq via INTRAVENOUS
  Filled 2013-06-08 (×4): qty 100

## 2013-06-08 MED ORDER — ASPIRIN 81 MG PO CHEW
81.0000 mg | CHEWABLE_TABLET | Freq: Every day | ORAL | Status: DC
  Administered 2013-06-08: 81 mg via ORAL
  Filled 2013-06-08: qty 1

## 2013-06-08 MED ORDER — IRBESARTAN 300 MG PO TABS
300.0000 mg | ORAL_TABLET | Freq: Every day | ORAL | Status: DC
  Administered 2013-06-08 – 2013-06-11 (×4): 300 mg via ORAL
  Filled 2013-06-08 (×4): qty 1

## 2013-06-08 NOTE — Clinical Documentation Improvement (Signed)
THIS DOCUMENT IS NOT A PERMANENT PART OF THE MEDICAL RECORD  Please update your documentation with the medical record to reflect your response to this query. If you need help knowing how to do this please call [phone].  06/08/13  Dear Dr. Oliver Pila,   Per chart Notes patient on admission had "hypertensive emergency" with SBP to240-250 mmHg. In the Coding world this term equals "benign hypertension". Please clarify if you mean simple HTN, accelerated HTN or malignant HTN to illustrate this patient's severity of illness and risk of mortality. Thank you.  Possible Clinical Conditions?  - hypertension - accelerated hypertension - malignant hypertension - other condition (please specify)   You may use possible, probable, or suspect with inpatient documentation. Possible, probable, suspected diagnoses MUST be documented at the time of discharge.  Reviewed:  no additional documentation provided  Thank You,  Ezekiel Ina RN Clinical Documentation Specialist: [place]

## 2013-06-08 NOTE — Consult Note (Signed)
CARDIOLOGY CONSULT NOTE   Patient ID: [name] MRN: [id number], DOB/AGE: 1951/06/14   Admit date: 06/06/2013 Date of Consult: 06/08/2013   Primary Physician: Walker Kehr, MD Primary Cardiologist: None  Pt. Profile  This is a 62 year old woman who was admitted with acute respiratory failure.  She does not have any prior history of known heart problems.  She has had a long history of COPD and a long history of dyspnea.  Problem List  Past Medical History  Diagnosis Date  . Anemia   . Personal history of colon cancer   . Diabetes mellitus   . Hyperlipidemia   . Hypertension   . Low back pain   . Obesity   . COPD (chronic obstructive pulmonary disease)   . Renal insufficiency     Past Surgical History  Procedure Laterality Date  . Colectomy       Allergies  Allergies  Allergen Reactions  . Atorvastatin     REACTION: aches and pains  . Enalapril Maleate     REACTION: cough  . Hydrochlorothiazide     REACTION: hair loss  . Propoxyphene-Acetaminophen   . Simvastatin     REACTION: cramps  . Spironolactone     REACTION: cramps    HPI   The patient has had a long history of exertional dyspnea.  She does admit to smoking cigarettes.  She was admitted with working diagnosis of probable cardiogenic pulmonary edema.  She is followed medically by Dr. Alain Marion but has not seen him in several years.  She has been noncompliant with medication.  She has a past history of diabetes mellitus and a past history of colon cancer and a history of hypertension.  She does not have any known history of ischemic heart disease or left ventricular dysfunction.  She denies any history of exertional chest pain or angina pectoris previously.  She was markedly hypertensive on admission. Echocardiogram yesterday showed ejection fraction of 30-35% with combined systolic and diastolic dysfunction.  Inpatient Medications  . albuterol  2.5 mg Nebulization Q4H  . aspirin  81 mg Oral  Daily  . carvedilol  3.125 mg Oral BID WC  . insulin aspart  0-15 Units Subcutaneous Q4H  . insulin glargine  10 Units Subcutaneous QHS  . ipratropium  0.5 mg Nebulization Q4H  . irbesartan  300 mg Oral Daily  . pantoprazole (PROTONIX) IV  40 mg Intravenous QHS    Family History Family History  Problem Relation Age of Onset  . Hypertension       Social History History   Social History  . Marital Status: Single    Spouse Name: N/A    Number of Children: N/A  . Years of Education: N/A   Occupational History  . Not on file.   Social History Main Topics  . Smoking status: Current Every Day Smoker  . Smokeless tobacco: Not on file  . Alcohol Use:   . Drug Use:   . Sexual Activity:    Other Topics Concern  . Not on file   Social History Narrative  . No narrative on file     Review of Systems  General:  No chills, fever, night sweats or weight changes.  Cardiovascular:  No chest pain, dyspnea on exertion, edema, orthopnea, palpitations, paroxysmal nocturnal dyspnea. Dermatological: No rash, lesions/masses Respiratory: No cough, dyspnea Urologic: No hematuria, dysuria Abdominal:   No nausea, vomiting, diarrhea, bright red blood per rectum, melena, or hematemesis Neurologic:  No visual changes, wkns, changes  in mental status. All other systems reviewed and are otherwise negative except as noted above.  Physical Exam  Blood pressure 134/66, pulse 110, temperature 100.1 F (37.8 C), temperature source Core (Comment), resp. rate 22, height 5\' 3"  (1.6 m), weight 191 lb 9.3 oz (86.9 kg), SpO2 97.00%.  General: Pleasant, NAD Psych: Normal affect. Neuro: Alert and oriented X 3. Moves all extremities spontaneously. HEENT: Normal  Neck: Supple without bruits or JVD. Lungs:  Minimal basilar rales Heart: RRR no s3, s4, or murmurs. Abdomen: Soft, non-tender, non-distended, BS + x 4.  Extremities: No clubbing, cyanosis or edema. DP/PT/Radials 2+ and equal  bilaterally.  Labs   Recent Labs  06/07/13 1450 06/07/13 1920 06/08/13 0150 06/08/13 0740  TROPONINI 3.70* 3.21* 3.03* 0.93*   Lab Results  Component Value Date   WBC 7.6 06/08/2013   HGB 11.3* 06/08/2013   HCT 34.5* 06/08/2013   MCV 85.2 06/08/2013   PLT 132* 06/08/2013    Recent Labs Lab 06/06/13 2248  06/08/13 0510  NA 131*  < > 140  K 3.2*  < > 3.3*  CL 94*  < > 105  CO2 20  < > 24  BUN 14  < > 23  CREATININE 0.88  < > 1.35*  CALCIUM 8.3*  < > 8.4  PROT 6.7  --   --   BILITOT 0.2*  --   --   ALKPHOS 224*  --   --   ALT 31  --   --   AST 68*  --   --   GLUCOSE 648*  < > 200*  < > = values in this interval not displayed. Lab Results  Component Value Date   CHOL 154 05/06/2010   HDL 45.90 05/06/2010   LDLCALC 97 05/06/2010   TRIG 95 06/07/2013   No results found for this basename: DDIMER    Radiology/Studies  Ct Head Wo Contrast  06/07/2013   CLINICAL DATA:  Respiratory distress and altered mental status. Patient on ventilator.  EXAM: CT HEAD WITHOUT CONTRAST  TECHNIQUE: Contiguous axial images were obtained from the base of the skull through the vertex without intravenous contrast.  COMPARISON:  None.  FINDINGS: Sinuses/Soft tissues: Probable secretions within the nasopharynx, related to endotracheal tube or nasogastric tube. No significant soft tissue swelling. Clear paranasal sinuses and mastoid air cells.  Intracranial: No mass lesion, hemorrhage, hydrocephalus, acute infarct, intra-axial, or extra-axial fluid collection.  IMPRESSION: No acute intracranial abnormality.   Electronically Signed   By: Abigail Miyamoto M.D.   On: 06/07/2013 00:02   Dg Chest Port 1 View  06/08/2013   CLINICAL DATA:  Endotracheal tube position  EXAM: PORTABLE CHEST - 1 VIEW  COMPARISON:  06/06/2013  FINDINGS: Endotracheal tube 2 cm above the carina. NG tube enters the stomach.  Improvement in diffuse bilateral airspace disease suggestive of resolving pneumonia. Progression of left lower lobe  atelectasis and small effusion.  IMPRESSION: Endotracheal tube in good position.  Resolving pulmonary edema.  Increase in mild left lower lobe atelectasis and small left effusion.   Electronically Signed   By: Franchot Gallo M.D.   On: 06/08/2013 07:52   Dg Chest Portable 1 View  06/06/2013   CLINICAL DATA:  Endotracheal tube, nonresponsive  EXAM: PORTABLE CHEST - 1 VIEW  COMPARISON:  Prior radiograph from 4/9/2 IX  FINDINGS: Endotracheal tube is in place with tip located 6.4 cm above the carinal. Enteric tube courses into the abdomen.  Transverse heart size is stable as  compared to the prior examination, and remains within normal limits. There are increased atherosclerotic calcifications within the aortic arch as compared to prior. Mediastinal silhouette is within normal limits.  Lung volumes are normal. There is diffuse pulmonary vascular congestion with interstitial thickening, suggestive of pulmonary interstitial edema. Superimposed infection is not excluded. No pleural effusion. No pneumothorax.  Osseous structures are within normal limits.  IMPRESSION: 1. Tip of the endotracheal tube 6.4 cm above the carinal. 2. Extensive bilateral interstitial lung opacities, suggestive of pulmonary interstitial edema.   Electronically Signed   By: Jeannine Boga M.D.   On: 06/06/2013 23:27    ECG  EKG dated 06/06/13 shows sinus tachycardia and poor R-wave progression across the anterior precordium.  Will repeat.  2-D echocardiogram Dated 06/07/13 shows: Study Conclusions  - Left ventricle: The cavity size was normal. Wall thickness was increased in a pattern of mild LVH. Systolic function was moderately to severely reduced. The estimated ejection fraction was in the range of 30% to 35%. Diffuse hypokinesis. Doppler parameters are consistent with abnormal left ventricular relaxation (grade 1 diastolic dysfunction). - Aortic valve: Trivial regurgitation. - Mitral valve: Calcified annulus. - Left  atrium: The atrium was mildly dilated. - Pericardium, extracardiac: A trivial pericardial effusion was identified posterior to the heart.   ASSESSMENT AND PLAN 1. non-STEMI probably secondary to demand ischemia with troponins trending down. 2. 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 diabetes mellitus blood sugar greater than 600 on admission 4. acute combined systolic and diastolic heart failure with resolving cardiogenic pulmonary edema and ejection fraction of 30-35% 5. mild renal insufficiency  Plan: We'll plan for left heart cardiac catheterization on Friday morning December 5 if her renal function is stable.  Discussed with patient who agrees.   Signed, Darlin Coco, MD  06/08/2013, 4:14 PM

## 2013-06-08 NOTE — Progress Notes (Signed)
PULMONARY  / CRITICAL CARE MEDICINE  Name: [name] MRN: [id number] DOB: 1951-06-20    ADMISSION DATE:  06/06/2013 CONSULTATION DATE:  06/06/2013  PRIMARY SERVICE: PCCM  CHIEF COMPLAINT:  Respiratory distress  BRIEF PATIENT DESCRIPTION: 62 yo with COPD and CHF admitted with bilateral pulmonary infiltrates and acute respiratory failure.  SIGNIFICANT EVENTS / STUDIES:  2023/06/23  CT head >>> nad June 23, 2023  TTE >>> LFH, EF [id number], diastolic dysfunction  [id number]  Weaned / extubated  LINES / TUBES: OETT 12/02 >>> 12/04 OGT  12/02 >>> 12/04 Foley  12/02 >>>  CULTURES: 06-23-23  Blood >>>  06/23/2023  Respiratory  >>>   ANTIBIOTICS:  SUBJECTIVE:  Weaning well. No overnight issues.  VITAL SIGNS: Temp:  [89.6 F (32 C)-101 F (38.3 C)] 100.5 F (38.1 C) (12/04 0800) Pulse Rate:  [86-105] 96 (12/04 0805) Resp:  [15-31] 24 (12/04 0800) BP: (83-133)/(49-86) 127/67 mmHg (12/04 0805) SpO2:  [96 %-100 %] 98 % (12/04 0800) FiO2 (%):  [30 %-40 %] 30 % (12/04 0800) Weight:  [86.9 kg (191 lb 9.3 oz)] 86.9 kg (191 lb 9.3 oz) (12/04 0343) HEMODYNAMICS:   VENTILATOR SETTINGS: Vent Mode:  [-] PRVC FiO2 (%):  [30 %-40 %] 30 % Set Rate:  [24 bmp] 24 bmp Vt Set:  [460 mL] 460 mL PEEP:  [5 cmH20] 5 cmH20 Pressure Support:  [10 cmH20] 10 cmH20 Plateau Pressure:  [14 cmH20-18 cmH20] 18 cmH20 INTAKE / OUTPUT: Intake/Output     06/23/2023 0701 - 12/04 0700 12/04 0701 - 12/05 0700   I.V. (mL/kg) 804.3 (9.3) 18.5 (0.2)   NG/GT 190 10   Total Intake(mL/kg) 994.3 (11.4) 28.5 (0.3)   Urine (mL/kg/hr) 825 (0.4)    Total Output 825     Net +169.3 +28.5          PHYSICAL EXAMINATION: General:  NO distress, comfortable on SBT Neuro:  Follows commands, nonfocal, strong cough / gag HEENT:  PERRL, OETT / OGT Cardiovascular:  RRR, no m/r/g Lungs:  Bilateral air entry, no added sounds Abdomen:  Soft, nontender, bowel sounds diminished Musculoskeletal:  Trace edema Skin:  Intact  LABS:  CBC  Recent  Labs Lab 06/06/13 2248 06/06/13 2307 06/22/13 0340 06/08/13 0510  WBC 12.5*  --  9.0 7.6  HGB 15.7* 15.6* 12.3 11.3*  HCT 46.4* 46.0 37.2 34.5*  PLT 222  --  161 132*   Coag's  Recent Labs Lab 06-22-13 1920  APTT 139*  INR 1.16   BMET  Recent Labs Lab 06/06/13 2248 06/06/13 2307 Jun 22, 2013 0340 06/08/13 0510  NA 131* 136 140 140  K 3.2* 3.2* 3.2* 3.3*  CL 94* 99 103 105  CO2 20  --  24 24  BUN 14 17 16 23   CREATININE 0.88 1.10 1.28* 1.35*  GLUCOSE 648* 664* 400* 200*   Electrolytes  Recent Labs Lab 06/06/13 2248 06/22/2013 0340 06/08/13 0510  CALCIUM 8.3* 8.5 8.4  MG  --  1.8  --   PHOS  --  3.1  --    Sepsis Markers  Recent Labs Lab 06/22/13 0340 06/08/13 0510  LATICACIDVEN 4.3*  --   PROCALCITON 0.74 1.45   ABG  Recent Labs Lab 06/06/13 2352 06/22/13 0133 06/08/13 0421  PHART 7.154* 7.267* 7.439  PCO2ART 75.7* 52.8* 37.6  PO2ART 313.0* 66.0* 104.0*   Liver Enzymes  Recent Labs Lab 06/06/13 2248  AST 68*  ALT 31  ALKPHOS 224*  BILITOT 0.2*  ALBUMIN 2.8*   Cardiac Enzymes  Recent Labs Lab 06/06/13 2245  06/07/13 0340 06/07/13 1450 06/07/13 1920 06/08/13 0150  TROPONINI  --   < > 0.49* 3.70* 3.21* 3.03*  PROBNP 1477.0*  --  2846.0*  --   --   --   < > = values in this interval not displayed. Glucose  Recent Labs Lab 06/07/13 1410 06/07/13 1628 06/07/13 1813 06/07/13 2004 06/07/13 2357 06/08/13 0427  GLUCAP 93 114* 136* 140* 169* 207*   CXR: 12/04 >>>  Hardware in place, improved bilateral airspace disease  ASSESSMENT / PLAN:  PULMONARY A: COPD without evidence of exacerbation / acute bronchospasm.  Acute pulmonary edema.  Acute respiratory failure. 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 Hypertensive emergency.  Demand ischemia vs NSTEMI. P:  Goal  BP<140/80 ASA Start Heparin per ACS, stop after 48 hours Coreg 3.25 bid Add preadmission Valsartan No Lipitor Likely need cardiac cath prior to d/c, Cardiology consult requested  RENAL A:  AKI, Cr increasing.  Hypokalemia. P:   Trend BMP D/c Lasix  K 10 x 4  GASTROINTESTINAL A:  No active issues. P:   Advance diet as tolerated D/c TF Protonix as preadmission  HEMATOLOGIC A:  Therapeutic anticoagulation for ACS. P:  D/c Heparin [place] as on gtt  INFECTIOUS A:  Doubt pneumonia as WBC / PCT reassuring. P:   Defer abx  ENDOCRINE A:  DM.  Hyperglycemia - resolving.  Suspected DKA, but negative ketones.  P:   SSI Lantus 10 Resume [place], Janumet when able Hold Metformin as increased Cr and possible cardiac cath  NEUROLOGIC A:  Acute encephalopathy. P:   Goal RASS 0 to -1 D/c Propofol gtt  I have personally obtained history, examined patient, evaluated and interpreted laboratory and imaging results, reviewed medical records, formulated assessment / plan and placed orders.  CRITICAL CARE:  The patient is critically ill with multiple organ systems failure and requires high complexity decision making for assessment and support, frequent evaluation and titration of therapies, application of advanced monitoring technologies and extensive interpretation of multiple databases. Critical Care Time devoted to patient care services described in this note is 45 minutes.   Doree Fudge, MD Pulmonary and [place] Pager: [phone]  06/08/2013, 8:27 AM

## 2013-06-08 NOTE — Progress Notes (Signed)
MD placed pt on wean and then ordered extubation.

## 2013-06-08 NOTE — Progress Notes (Signed)
ANTICOAGULATION CONSULT NOTE - Follow Up Consult  Pharmacy Consult:  Heparin Indication:  ACS  Allergies  Allergen Reactions  . Atorvastatin     REACTION: aches and pains  . Enalapril Maleate     REACTION: cough  . Hydrochlorothiazide     REACTION: hair loss  . Propoxyphene-Acetaminophen   . Simvastatin     REACTION: cramps  . Spironolactone     REACTION: cramps    Patient Measurements: Height: 5\' 3"  (160 cm) Weight: 191 lb 9.3 oz (86.9 kg) IBW/kg (Calculated) : 52.4 Heparin Dosing Weight: 70 kg  Vital Signs: Temp: 100.5 F (38.1 C) (12/04 0800) Temp src: Core (Comment) (12/04 0800) BP: 127/67 mmHg (12/04 0805) Pulse Rate: 96 (12/04 0805)  Labs:  Recent Labs  06/06/13 2248 06/06/13 2307  06/07/13 0340 06/07/13 1450 06/07/13 1920 06/08/13 0150 06/08/13 0510  HGB 15.7* 15.6*  --  12.3  --   --   --  11.3*  HCT 46.4* 46.0  --  37.2  --   --   --  34.5*  PLT 222  --   --  161  --   --   --  132*  APTT  --   --   --   --   --  139*  --   --   LABPROT  --   --   --   --   --  14.6  --   --   INR  --   --   --   --   --  1.16  --   --   HEPARINUNFRC  --   --   --   --   --   --  0.44  --   CREATININE 0.88 1.10  --  1.28*  --   --   --  1.35*  TROPONINI  --   --   < > 0.49* 3.70* 3.21* 3.03*  --   < > = values in this interval not displayed.  Estimated Creatinine Clearance: 45.2 ml/min (by C-G formula based on Cr of 1.35).      Assessment: 25 YOF continues on IV heparin for NSTEMI.  Confirmatory heparin level is therapeutic; no bleeding reported.  Noted thrombocytopenia.   Goal of Therapy:  Heparin level 0.3-0.7 units/ml Monitor platelets by anticoagulation protocol: Yes    Plan:  - Continue heparin gtt at 850 units/hr - Daily HL / CBC - Stop heparin after 48 hrs per CCM    Mao Lockner D. Mina Marble, PharmD, BCPS Pager:  [phone] 06/08/2013, 8:46 AM

## 2013-06-08 NOTE — Progress Notes (Signed)
[place] for heparin Indication: chest pain/ACS  Allergies  Allergen Reactions  . Atorvastatin     REACTION: aches and pains  . Enalapril Maleate     REACTION: cough  . Hydrochlorothiazide     REACTION: hair loss  . Propoxyphene-Acetaminophen   . Simvastatin     REACTION: cramps  . Spironolactone     REACTION: cramps    Patient Measurements: Weight: 195 lb 15.8 oz (88.9 kg) Heparin Dosing Weight: 70kg   Vital Signs: Temp: 100.4 F (38 C) (12/04 0200) Temp src: Core (Comment) (12/04 0000) BP: 115/68 mmHg (12/04 0100) Pulse Rate: 105 (12/04 0200)  Labs:  Recent Labs  06/06/13 2248 06/06/13 2307  06/07/13 0340 06/07/13 1450 06/07/13 1920 06/08/13 0150  HGB 15.7* 15.6*  --  12.3  --   --   --   HCT 46.4* 46.0  --  37.2  --   --   --   PLT 222  --   --  161  --   --   --   APTT  --   --   --   --   --  139*  --   LABPROT  --   --   --   --   --  14.6  --   INR  --   --   --   --   --  1.16  --   HEPARINUNFRC  --   --   --   --   --   --  0.44  CREATININE 0.88 1.10  --  1.28*  --   --   --   TROPONINI  --   --   < > 0.49* 3.70* 3.21* 3.03*  < > = values in this interval not displayed.  The CrCl is unknown because both a height and weight (above a minimum accepted value) are required for this calculation.  Assessment: 62 yo female with VDRF and elevated cardiac markers for heparin  Goal of Therapy:  Heparin level 0.3-0.7 units/ml Monitor platelets by anticoagulation protocol: Yes   Plan:  Continue Heparin at current rate  Follow-up am labs.  Phillis Knack, PharmD, BCPS  06/08/2013 3:16 AM

## 2013-06-09 ENCOUNTER — Encounter (HOSPITAL_COMMUNITY)
Admission: EM | Disposition: A | Discharge status: Home / Self Care | Payer: Self-pay | Source: Home / Self Care | Attending: Emergency Medicine

## 2013-06-09 ENCOUNTER — Encounter (HOSPITAL_COMMUNITY): Payer: Self-pay | Admitting: *Deleted

## 2013-06-09 DIAGNOSIS — I214 Non-ST elevation (NSTEMI) myocardial infarction: Secondary | ICD-10-CM

## 2013-06-09 DIAGNOSIS — I2510 Atherosclerotic heart disease of native coronary artery without angina pectoris: Secondary | ICD-10-CM

## 2013-06-09 HISTORY — PX: LEFT HEART CATHETERIZATION WITH CORONARY ANGIOGRAM: SHX5451

## 2013-06-09 LAB — HEPARIN LEVEL (UNFRACTIONATED): Heparin Unfractionated: 0.32 IU/mL (ref 0.30–0.70)

## 2013-06-09 LAB — CBC
HCT: 33.1 % — ABNORMAL LOW (ref 36.0–46.0)
Hemoglobin: 10.9 g/dL — ABNORMAL LOW (ref 12.0–15.0)
MCH: 28.2 pg (ref 26.0–34.0)
MCHC: 32.9 g/dL (ref 30.0–36.0)
MCV: 85.8 fL (ref 78.0–100.0)
Platelets: 134 10*3/uL — ABNORMAL LOW (ref 150–400)
RBC: 3.86 MIL/uL — ABNORMAL LOW (ref 3.87–5.11)
RDW: 14.2 % (ref 11.5–15.5)
WBC: 5.6 10*3/uL (ref 4.0–10.5)

## 2013-06-09 LAB — BASIC METABOLIC PANEL
BUN: 18 mg/dL (ref 6–23)
CO2: 23 mEq/L (ref 19–32)
Calcium: 8.4 mg/dL (ref 8.4–10.5)
Chloride: 105 mEq/L (ref 96–112)
Creatinine, Ser: 1.1 mg/dL (ref 0.50–1.10)
GFR calc Af Amer: 61 mL/min — ABNORMAL LOW (ref >90)
GFR calc non Af Amer: 53 mL/min — ABNORMAL LOW (ref >90)
Glucose, Bld: 118 mg/dL — ABNORMAL HIGH (ref 70–99)
Potassium: 3.3 mEq/L — ABNORMAL LOW (ref 3.5–5.1)
Sodium: 140 mEq/L (ref 135–145)

## 2013-06-09 LAB — GLUCOSE, CAPILLARY
Glucose-Capillary: 106 mg/dL — ABNORMAL HIGH (ref 70–99)
Glucose-Capillary: 117 mg/dL — ABNORMAL HIGH (ref 70–99)
Glucose-Capillary: 174 mg/dL — ABNORMAL HIGH (ref 70–99)
Glucose-Capillary: 185 mg/dL — ABNORMAL HIGH (ref 70–99)
Glucose-Capillary: 72 mg/dL (ref 70–99)
Glucose-Capillary: 94 mg/dL (ref 70–99)

## 2013-06-09 LAB — POCT ACTIVATED CLOTTING TIME: Activated Clotting Time: 493 seconds

## 2013-06-09 LAB — CULTURE, RESPIRATORY W GRAM STAIN: Special Requests: NORMAL

## 2013-06-09 SURGERY — LEFT HEART CATHETERIZATION WITH CORONARY ANGIOGRAM
Anesthesia: LOCAL

## 2013-06-09 MED ORDER — GLIMEPIRIDE 4 MG PO TABS
4.0000 mg | ORAL_TABLET | Freq: Two times a day (BID) | ORAL | Status: DC
  Administered 2013-06-09 – 2013-06-11 (×4): 4 mg via ORAL
  Filled 2013-06-09 (×6): qty 1

## 2013-06-09 MED ORDER — MIDAZOLAM HCL 2 MG/2ML IJ SOLN
INTRAMUSCULAR | Status: AC
  Filled 2013-06-09: qty 2

## 2013-06-09 MED ORDER — HEPARIN SODIUM (PORCINE) 1000 UNIT/ML IJ SOLN
INTRAMUSCULAR | Status: AC
  Filled 2013-06-09: qty 1

## 2013-06-09 MED ORDER — FENTANYL CITRATE 0.05 MG/ML IJ SOLN
INTRAMUSCULAR | Status: AC
  Filled 2013-06-09: qty 2

## 2013-06-09 MED ORDER — PRASUGREL HCL 10 MG PO TABS
ORAL_TABLET | ORAL | Status: AC
  Filled 2013-06-09: qty 1

## 2013-06-09 MED ORDER — PRASUGREL HCL 10 MG PO TABS
ORAL_TABLET | ORAL | Status: AC
  Filled 2013-06-09: qty 5

## 2013-06-09 MED ORDER — BIVALIRUDIN 250 MG IV SOLR
INTRAVENOUS | Status: AC
  Filled 2013-06-09: qty 250

## 2013-06-09 MED ORDER — DIAZEPAM 5 MG PO TABS
5.0000 mg | ORAL_TABLET | ORAL | Status: AC
  Administered 2013-06-09: 5 mg via ORAL
  Filled 2013-06-09: qty 1

## 2013-06-09 MED ORDER — ASPIRIN 81 MG PO CHEW
81.0000 mg | CHEWABLE_TABLET | Freq: Every day | ORAL | Status: DC
  Administered 2013-06-09 – 2013-06-11 (×3): 81 mg via ORAL
  Filled 2013-06-09 (×2): qty 1

## 2013-06-09 MED ORDER — DIAZEPAM 5 MG PO TABS: 5.0000 mg | ORAL_TABLET | ORAL | Status: DC

## 2013-06-09 MED ORDER — SODIUM CHLORIDE 0.9 % IV SOLN
INTRAVENOUS | Status: DC
  Administered 2013-06-09: 10:00:00 via INTRAVENOUS

## 2013-06-09 MED ORDER — HEPARIN (PORCINE) IN NACL 2-0.9 UNIT/ML-% IJ SOLN
INTRAMUSCULAR | Status: AC
  Filled 2013-06-09: qty 1500

## 2013-06-09 MED ORDER — HEPARIN SODIUM (PORCINE) 5000 UNIT/ML IJ SOLN
5000.0000 [IU] | Freq: Three times a day (TID) | INTRAMUSCULAR | Status: DC
  Administered 2013-06-09 – 2013-06-10 (×2): 5000 [IU] via SUBCUTANEOUS
  Filled 2013-06-09 (×5): qty 1

## 2013-06-09 MED ORDER — LIDOCAINE HCL (PF) 1 % IJ SOLN
INTRAMUSCULAR | Status: AC
  Filled 2013-06-09: qty 30

## 2013-06-09 MED ORDER — PANTOPRAZOLE SODIUM 40 MG PO TBEC
40.0000 mg | DELAYED_RELEASE_TABLET | Freq: Every day | ORAL | Status: DC
  Administered 2013-06-09: 40 mg via ORAL
  Filled 2013-06-09: qty 1

## 2013-06-09 MED ORDER — NITROGLYCERIN 0.2 MG/ML ON CALL CATH LAB
INTRAVENOUS | Status: AC
  Filled 2013-06-09: qty 1

## 2013-06-09 MED ORDER — POTASSIUM CHLORIDE CRYS ER 20 MEQ PO TBCR
40.0000 meq | EXTENDED_RELEASE_TABLET | Freq: Once | ORAL | Status: AC
  Administered 2013-06-09: 40 meq via ORAL
  Filled 2013-06-09: qty 2

## 2013-06-09 MED ORDER — INSULIN ASPART 100 UNIT/ML ~~LOC~~ SOLN
0.0000 [IU] | Freq: Three times a day (TID) | SUBCUTANEOUS | Status: DC
  Administered 2013-06-09: 3 [IU] via SUBCUTANEOUS
  Administered 2013-06-10: 5 [IU] via SUBCUTANEOUS
  Administered 2013-06-10: 3 [IU] via SUBCUTANEOUS
  Administered 2013-06-10 – 2013-06-11 (×2): 2 [IU] via SUBCUTANEOUS

## 2013-06-09 MED ORDER — VERAPAMIL HCL 2.5 MG/ML IV SOLN
INTRAVENOUS | Status: AC
  Filled 2013-06-09: qty 2

## 2013-06-09 MED ORDER — ASPIRIN 81 MG PO CHEW
81.0000 mg | CHEWABLE_TABLET | ORAL | Status: DC
  Filled 2013-06-09: qty 1

## 2013-06-09 MED ORDER — PRASUGREL HCL 10 MG PO TABS
10.0000 mg | ORAL_TABLET | Freq: Every day | ORAL | Status: DC
  Administered 2013-06-10 – 2013-06-11 (×2): 10 mg via ORAL
  Filled 2013-06-09 (×2): qty 1

## 2013-06-09 MED ORDER — SODIUM CHLORIDE 0.9 % IV SOLN: 1.0000 mL/kg/h | INTRAVENOUS | Status: AC

## 2013-06-09 NOTE — Interval H&P Note (Signed)
History and Physical Interval Note:  06/09/2013 10:57 AM  [name]  has presented today for surgery, with the diagnosis of Chest pain  The various methods of treatment have been discussed with the patient and family. After consideration of risks, benefits and other options for treatment, the patient has consented to  Procedure(s): LEFT HEART CATHETERIZATION WITH CORONARY ANGIOGRAM (N/A) as a surgical intervention .  The patient's history has been reviewed, patient examined, no change in status, stable for surgery.  I have reviewed the patient's chart and labs.  Questions were answered to the patient's satisfaction.      Cath Lab Visit (complete for each Cath Lab visit)  Clinical Evaluation Leading to the Procedure:   ACS: yes  Non-ACS:    Anginal Classification: CCS IV  Anti-ischemic medical therapy: Minimal Therapy (1 class of medications)  Non-Invasive Test Results: No non-invasive testing performed  Prior CABG: No previous CABG       Darden Amber.

## 2013-06-09 NOTE — Progress Notes (Signed)
Complained of shortness of breath , pulse ox 100%, placed on o2 2l [place] . Continue to monitor.

## 2013-06-09 NOTE — Progress Notes (Signed)
PROGRESS NOTE  Subjective:   Mr. Wesselmann is a 62 yo with hx of smoking, COPD and respiratory failure.  She had + Troponin levels c/w NSTEMI.  She is scheduled for cath.   Objective:    Vital Signs:   Temp:  [88.3 F (31.3 C)-101.4 F (38.6 C)] 98.7 F (37.1 C) (12/05 0800) Pulse Rate:  [56-119] 104 (12/05 0800) Resp:  [8-32] 10 (12/05 0800) BP: (112-153)/(46-92) 150/92 mmHg (12/05 0800) SpO2:  [78 %-100 %] 100 % (12/05 0830) Weight:  [187 lb 13.3 oz (85.2 kg)] 187 lb 13.3 oz (85.2 kg) (12/05 0500)  Last BM Date:  (prior to admit)   24-hour weight change: Weight change: -3 lb 12 oz (-1.7 kg)  Weight trends: Filed Weights   06/07/13 0200 06/08/13 0343 06/09/13 0500  Weight: 195 lb 15.8 oz (88.9 kg) 191 lb 9.3 oz (86.9 kg) 187 lb 13.3 oz (85.2 kg)    Intake/Output:  12/04 0701 - 12/05 0700 In: [id number] [P.O.:360; I.V.:444; NG/GT:40; IV Piggyback:300] Out: 3005 [Urine:3005] Total I/O In: 18.5 [I.V.:18.5] Out: -    Physical Exam: BP 150/92  Pulse 104  Temp(Src) 98.7 F (37.1 C) (Core (Comment))  Resp 10  Ht 5\' 3"  (1.6 m)  Wt 187 lb 13.3 oz (85.2 kg)  BMI 33.28 kg/m2  SpO2 100%  General: Vital signs reviewed and noted.   Head: Normocephalic, atraumatic.  Eyes: conjunctivae/corneas clear.  EOM's intact.   Throat: normal  Neck:  normal  Lungs:    basilar rales  Heart:  RR  Abdomen:  Soft, non-tender, non-distended    Extremities: Trace edema   Neurologic: A&O X3, CN II - XII are grossly intact.   Psych: Normal     Labs: BMET:  Recent Labs  06/06/13 2307 06/07/13 0340 06/08/13 0510 06/09/13 0600  NA 136 140 140 140  K 3.2* 3.2* 3.3* 3.3*  CL 99 103 105 105  CO2  --  24 24 23   GLUCOSE 664* 400* 200* 118*  BUN 17 16 23 18   CREATININE 1.10 1.28* 1.35* 1.10  CALCIUM  --  8.5 8.4 8.4  MG  --  1.8  --   --   PHOS  --  3.1  --   --     Liver function tests:  Recent Labs  06/06/13 2248  AST 68*  ALT 31  ALKPHOS 224*  BILITOT 0.2*    PROT 6.7  ALBUMIN 2.8*   No results found for this basename: LIPASE, AMYLASE,  in the last 72 hours  CBC:  Recent Labs  06/06/13 2248  06/08/13 0510 06/09/13 0600  WBC 12.5*  < > 7.6 5.6  NEUTROABS 4.1  --   --   --   HGB 15.7*  < > 11.3* 10.9*  HCT 46.4*  < > 34.5* 33.1*  MCV 87.4  < > 85.2 85.8  PLT 222  < > 132* 134*  < > = values in this interval not displayed.  Cardiac Enzymes:  Recent Labs  06/07/13 1450 06/07/13 1920 06/08/13 0150 06/08/13 0740  TROPONINI 3.70* 3.21* 3.03* 0.93*    Coagulation Studies:  Recent Labs  06/07/13 1920  LABPROT 14.6  INR 1.16    Other: No components found with this basename: POCBNP,  No results found for this basename: DDIMER,  in the last 72 hours No results found for this basename: HGBA1C,  in the last 72 hours  Recent Labs  06/07/13 0340  TRIG 95  Recent Labs  06/07/13 0340  TSH 4.283   No results found for this basename: VITAMINB12, FOLATE, FERRITIN, TIBC, IRON, RETICCTPCT,  in the last 72 hours   Other results: Tele:  NSR  Medications:    Infusions: . sodium chloride 10 mL/hr at 06/07/13 0238  . heparin 850 Units/hr (06/08/13 1331)    Scheduled Medications: . albuterol  2.5 mg Nebulization Q4H  . [START ON 06/10/2013] aspirin  81 mg Oral Pre-Cath  . aspirin  81 mg Oral Daily  . carvedilol  3.125 mg Oral BID WC  . diazepam  5 mg Oral On Call  . insulin aspart  0-15 Units Subcutaneous Q4H  . insulin glargine  10 Units Subcutaneous QHS  . ipratropium  0.5 mg Nebulization Q4H  . irbesartan  300 mg Oral Daily  . pantoprazole  40 mg Oral QHS  . potassium chloride  40 mEq Oral Once    Assessment/ Plan:   Active Problems:   DIABETES MELLITUS, TYPE II   HYPERTENSION   COPD   Acute respiratory failure   Hypertensive crisis   Acute diastolic CHF (congestive heart failure)   Metabolic acidosis  NSTEMI:  Pt has reduced LV function and has had + Troponin levels.  Agree with left heart cath today.     I have discussed risks, benefits, options .  She understands and agrees to proceed.    Disposition:  Length of Stay: 3  Thayer Headings, Brooke Bonito., MD, [facility] 06/09/2013, 9:03 AM Office [phone] Pager [phone]

## 2013-06-09 NOTE — H&P (View-Only) (Signed)
PROGRESS NOTE  Subjective:   Mr. Dantuono is a 62 yo with hx of smoking, COPD and respiratory failure.  She had + Troponin levels c/w NSTEMI.  She is scheduled for cath.   Objective:    Vital Signs:   Temp:  [88.3 F (31.3 C)-101.4 F (38.6 C)] 98.7 F (37.1 C) (12/05 0800) Pulse Rate:  [56-119] 104 (12/05 0800) Resp:  [8-32] 10 (12/05 0800) BP: (112-153)/(46-92) 150/92 mmHg (12/05 0800) SpO2:  [78 %-100 %] 100 % (12/05 0830) Weight:  [187 lb 13.3 oz (85.2 kg)] 187 lb 13.3 oz (85.2 kg) (12/05 0500)  Last BM Date:  (prior to admit)   24-hour weight change: Weight change: -3 lb 12 oz (-1.7 kg)  Weight trends: Filed Weights   06/07/13 0200 06/08/13 0343 06/09/13 0500  Weight: 195 lb 15.8 oz (88.9 kg) 191 lb 9.3 oz (86.9 kg) 187 lb 13.3 oz (85.2 kg)    Intake/Output:  12/04 0701 - 12/05 0700 In: [id number] [P.O.:360; I.V.:444; NG/GT:40; IV Piggyback:300] Out: 3005 [Urine:3005] Total I/O In: 18.5 [I.V.:18.5] Out: -    Physical Exam: BP 150/92  Pulse 104  Temp(Src) 98.7 F (37.1 C) (Core (Comment))  Resp 10  Ht 5\' 3"  (1.6 m)  Wt 187 lb 13.3 oz (85.2 kg)  BMI 33.28 kg/m2  SpO2 100%  General: Vital signs reviewed and noted.   Head: Normocephalic, atraumatic.  Eyes: conjunctivae/corneas clear.  EOM's intact.   Throat: normal  Neck:  normal  Lungs:    basilar rales  Heart:  RR  Abdomen:  Soft, non-tender, non-distended    Extremities: Trace edema   Neurologic: A&O X3, CN II - XII are grossly intact.   Psych: Normal     Labs: BMET:  Recent Labs  06/06/13 2307 06/07/13 0340 06/08/13 0510 06/09/13 0600  NA 136 140 140 140  K 3.2* 3.2* 3.3* 3.3*  CL 99 103 105 105  CO2  --  24 24 23   GLUCOSE 664* 400* 200* 118*  BUN 17 16 23 18   CREATININE 1.10 1.28* 1.35* 1.10  CALCIUM  --  8.5 8.4 8.4  MG  --  1.8  --   --   PHOS  --  3.1  --   --     Liver function tests:  Recent Labs  06/06/13 2248  AST 68*  ALT 31  ALKPHOS 224*  BILITOT 0.2*    PROT 6.7  ALBUMIN 2.8*   No results found for this basename: LIPASE, AMYLASE,  in the last 72 hours  CBC:  Recent Labs  06/06/13 2248  06/08/13 0510 06/09/13 0600  WBC 12.5*  < > 7.6 5.6  NEUTROABS 4.1  --   --   --   HGB 15.7*  < > 11.3* 10.9*  HCT 46.4*  < > 34.5* 33.1*  MCV 87.4  < > 85.2 85.8  PLT 222  < > 132* 134*  < > = values in this interval not displayed.  Cardiac Enzymes:  Recent Labs  06/07/13 1450 06/07/13 1920 06/08/13 0150 06/08/13 0740  TROPONINI 3.70* 3.21* 3.03* 0.93*    Coagulation Studies:  Recent Labs  06/07/13 1920  LABPROT 14.6  INR 1.16    Other: No components found with this basename: POCBNP,  No results found for this basename: DDIMER,  in the last 72 hours No results found for this basename: HGBA1C,  in the last 72 hours  Recent Labs  06/07/13 0340  TRIG 95  Recent Labs  06/07/13 0340  TSH 4.283   No results found for this basename: VITAMINB12, FOLATE, FERRITIN, TIBC, IRON, RETICCTPCT,  in the last 72 hours   Other results: Tele:  NSR  Medications:    Infusions: . sodium chloride 10 mL/hr at 06/07/13 0238  . heparin 850 Units/hr (06/08/13 1331)    Scheduled Medications: . albuterol  2.5 mg Nebulization Q4H  . [START ON 06/10/2013] aspirin  81 mg Oral Pre-Cath  . aspirin  81 mg Oral Daily  . carvedilol  3.125 mg Oral BID WC  . diazepam  5 mg Oral On Call  . insulin aspart  0-15 Units Subcutaneous Q4H  . insulin glargine  10 Units Subcutaneous QHS  . ipratropium  0.5 mg Nebulization Q4H  . irbesartan  300 mg Oral Daily  . pantoprazole  40 mg Oral QHS  . potassium chloride  40 mEq Oral Once    Assessment/ Plan:   Active Problems:   DIABETES MELLITUS, TYPE II   HYPERTENSION   COPD   Acute respiratory failure   Hypertensive crisis   Acute diastolic CHF (congestive heart failure)   Metabolic acidosis  NSTEMI:  Pt has reduced LV function and has had + Troponin levels.  Agree with left heart cath today.     I have discussed risks, benefits, options .  She understands and agrees to proceed.    Disposition:  Length of Stay: 3  Thayer Headings, Brooke Bonito., MD, [facility] 06/09/2013, 9:03 AM Office [phone] Pager [phone]

## 2013-06-09 NOTE — CV Procedure (Signed)
     Cardiac Cath Note  [name] [id number] Nov 11, 1950  Procedure: Left  Heart Cardiac Catheterization Note Indications:  Respiratory failure, NSTEMI  Procedure Details Consent: Obtained Time Out: Verified patient identification, verified procedure, site/side was marked, verified correct patient position, special equipment/implants available, Radiology Safety Procedures followed,  medications/allergies/relevent history reviewed, required imaging and test results available.  Performed   Medications: Fentanyl: 50 mcg iv Versed: 2 mg iv Heparin 4500 units Verapamil 3 mg IA NTG 50 mcg SQ at right radial artery site  The right radial  artery was easily canulated using a modified Seldinger technique.  Hemodynamics:    LV pressure: 140/11 Aortic pressure: 138/67  Angiography   Left Main: short , no significant stenosis  Left anterior Descending: normal proximally, the mid segment has a tight 95% stenosis at 2nd diagonal   Left Circumflex: moderate diffuse irregularities., 40-50%   Right Coronary Artery:  Large, dominant,  Moderate irregularities  LV Gram: overall normal LV function.  Apical severe hypokinesis,  EF [id number]  Complications: No apparent complications Patient did tolerate procedure well.  Contrast used: 80 cc  Conclusions:   1. Significant stenosis in the midl LAD.  Moderate disease in LCx and RCA. 2. Overall well preserved LV function but with apical hypokinesis / akinesis  Have asked Dr. [place] to review for PCI of mid LAD.   [name], [name]., MD, [facility] 06/09/2013, 11:51 AM Office - [phone] Pager [phone]

## 2013-06-09 NOTE — Progress Notes (Signed)
TR band removed , 2x2 gauze and tegaderm applied. No bleeding noted.

## 2013-06-09 NOTE — Progress Notes (Signed)
ANTICOAGULATION CONSULT NOTE - Follow Up Consult  Pharmacy Consult:  Heparin Indication:  ACS  Allergies  Allergen Reactions  . Atorvastatin     REACTION: aches and pains  . Enalapril Maleate     REACTION: cough  . Hydrochlorothiazide     REACTION: hair loss  . Propoxyphene-Acetaminophen   . Simvastatin     REACTION: cramps  . Spironolactone     REACTION: cramps    Patient Measurements: Height: 5\' 3"  (160 cm) Weight: 187 lb 13.3 oz (85.2 kg) IBW/kg (Calculated) : 52.4 Heparin Dosing Weight: 70 kg  Vital Signs: Temp: 98.4 F (36.9 C) (12/05 0700) BP: 141/68 mmHg (12/05 0700) Pulse Rate: 101 (12/05 0700)  Labs:  Recent Labs  06/07/13 0340 06/07/13 1450 06/07/13 1920 06/08/13 0150 06/08/13 0510 06/08/13 0740 06/09/13 0600  HGB 12.3  --   --   --  11.3*  --  10.9*  HCT 37.2  --   --   --  34.5*  --  33.1*  PLT 161  --   --   --  132*  --  134*  APTT  --   --  139*  --   --   --   --   LABPROT  --   --  14.6  --   --   --   --   INR  --   --  1.16  --   --   --   --   HEPARINUNFRC  --   --   --  0.44  --  0.41 0.32  CREATININE 1.28*  --   --   --  1.35*  --  1.10  TROPONINI 0.49* 3.70* 3.21* 3.03*  --  0.93*  --     Estimated Creatinine Clearance: 54.8 ml/min (by C-G formula based on Cr of 1.1).   Assessment: 68 YOF continues on IV heparin for NSTEMI.  The heparin level is therapeutic at 0.32 on 850 units/hr. Hg= 10.9, plt=134.  Patient noted for cath today  Goal of Therapy:  Heparin level 0.3-0.7 units/ml Monitor platelets by anticoagulation protocol: Yes    Plan:  - Continue heparin gtt at 850 units/hr - Daily HL / CBC - Will follow post cath today  Hildred Laser, Pharm D 06/09/2013 7:34 AM

## 2013-06-09 NOTE — Progress Notes (Signed)
Foley care performed. Foley removed per protocol. No signs of bleeding or trauma. Pt tolerated well. Will monitor.

## 2013-06-09 NOTE — CV Procedure (Signed)
    CARDIAC CATH NOTE  Name: [name] MRN: [id number] DOB: 1951/03/09  Procedure: PTCA and stenting of the mid LAD  Indication: 63 yo BF with multiple cardiac risk factors presents with acute respiratory failure and a NSTEMI. EF is 30-35% by Echo. Diagnostic cardiac cath reveals a 90-95% stenosis in the mid LAD. She has moderate diffuse CAD.  Procedural Details: Weight-based bivalirudin was given for anticoagulation. Effient 60 mg was given orally. Once a therapeutic ACT was achieved, a 6 [place] XBLAD 3.0 guide catheter was inserted.  A prowater coronary guidewire was used to cross the lesion.  The lesion was predilated with a 2.5 mm balloon.  The lesion was then stented with a 3.0 x 15 mm Alpine stent.  The stent was postdilated with a 3.0 mm noncompliant balloon.  Following PCI, there was 0% residual stenosis and TIMI-3 flow. Final angiography confirmed an excellent result. The patient tolerated the procedure well. There were no immediate procedural complications. A TR band was used for radial hemostasis. The patient was transferred to the post catheterization recovery area for further monitoring.  Lesion Data: Vessel: Mid LAD Percent stenosis (pre): 90-95% TIMI-flow (pre):  3 Stent:  3.0 x 15 mm Alpine stent Percent stenosis (post): 0% TIMI-flow (post): 3  Conclusions: Successful stenting of the mid LAD with a DES.  Recommendations: dual antiplatelet therapy for one year.  Collier Salina [facility] 06/09/2013, 12:18 PM

## 2013-06-09 NOTE — Progress Notes (Signed)
TRansferred -in from the cath lab. By bed, awake and alert. TRband to right wrist in placed, elevated with pillow. Pulse ox to right thumb.

## 2013-06-09 NOTE — Progress Notes (Signed)
PULMONARY  / CRITICAL CARE MEDICINE  Name: [name] MRN: [id number] DOB: [birth year]    ADMISSION DATE:  06/06/2013 CONSULTATION DATE:  06/06/2013  PRIMARY SERVICE: PCCM  CHIEF COMPLAINT:  Respiratory distress  BRIEF PATIENT DESCRIPTION: 62 yo with COPD and CHF admitted with bilateral pulmonary infiltrates and acute respiratory failure.  SIGNIFICANT EVENTS / STUDIES:  2023-07-08  CT head >>> nad 07-08-23  TTE >>> LFH, EF 99991111, diastolic dysfunction  123456  Weaned / extubated 12/05  Cardiac cath >>>  LINES / TUBES: OETT 12/02 >>> 12/04 OGT  12/02 >>> 12/04 Foley  12/02 >>>   CULTURES: Jul 08, 2023  Blood >>>  2023-07-08  Respiratory  >>>   ANTIBIOTICS:  SUBJECTIVE:  Seen by cardiology - cath lab today.  No chest pain / shortness of breath.  VITAL SIGNS: Temp:  [88.3 F (31.3 C)-101.4 F (38.6 C)] 98.7 F (37.1 C) (12/05 0800) Pulse Rate:  [56-119] 104 (12/05 0800) Resp:  [8-32] 10 (12/05 0800) BP: (112-153)/(46-92) 150/92 mmHg (12/05 0800) SpO2:  [78 %-100 %] 100 % (12/05 0830) Weight:  [85.2 kg (187 lb 13.3 oz)] 85.2 kg (187 lb 13.3 oz) (12/05 0500) HEMODYNAMICS:   VENTILATOR SETTINGS:   INTAKE / OUTPUT: Intake/Output     12/04 0701 - 12/05 0700 12/05 0701 - 12/06 0700   P.O. 360    I.V. (mL/kg) 444 (5.2) 37 (0.4)   NG/GT 40    IV Piggyback 300    Total Intake(mL/kg) 1144 (13.4) 37 (0.4)   Urine (mL/kg/hr) 3005 (1.5) 100 (0.6)   Total Output 3005 100   Net -1861 -63          PHYSICAL EXAMINATION: General:  Resting comfortably, on ambient air Neuro:  Awake, alert, coopearteive HEENT:  PERRL Cardiovascular:  RRR, no m/r/g Lungs:  Bilateral vesicular breath sounds Abdomen:  Soft, nontender, bowel sounds diminished Musculoskeletal:  Bilateral LE pitting edema Skin:  Intact  LABS:  CBC  Recent Labs Lab 07-Jul-2013 0340 06/08/13 0510 06/09/13 0600  WBC 9.0 7.6 5.6  HGB 12.3 11.3* 10.9*  HCT 37.2 34.5* 33.1*  PLT 161 132* 134*   Coag's  Recent Labs Lab  07-07-13 1920  APTT 139*  INR 1.16   BMET  Recent Labs Lab 07-07-2013 0340 06/08/13 0510 06/09/13 0600  NA 140 140 140  K 3.2* 3.3* 3.3*  CL 103 105 105  CO2 24 24 23   BUN 16 23 18   CREATININE 1.28* 1.35* 1.10  GLUCOSE 400* 200* 118*   Electrolytes  Recent Labs Lab 2013-07-07 0340 06/08/13 0510 06/09/13 0600  CALCIUM 8.5 8.4 8.4  MG 1.8  --   --   PHOS 3.1  --   --    Sepsis Markers  Recent Labs Lab July 07, 2013 0340 06/08/13 0510  LATICACIDVEN 4.3*  --   PROCALCITON 0.74 1.45   ABG  Recent Labs Lab 06/06/13 2352 07-07-13 0133 06/08/13 0421  PHART 7.154* 7.267* 7.439  PCO2ART 75.7* 52.8* 37.6  PO2ART 313.0* 66.0* 104.0*   Liver Enzymes  Recent Labs Lab 06/06/13 2248  AST 68*  ALT 31  ALKPHOS 224*  BILITOT 0.2*  ALBUMIN 2.8*   Cardiac Enzymes  Recent Labs Lab 06/06/13 2245 Jul 07, 2013 0340  Jul 07, 2013 1920 06/08/13 0150 06/08/13 0740  TROPONINI  --  0.49*  < > 3.21* 3.03* 0.93*  PROBNP 1477.0* 2846.0*  --   --   --   --   < > = values in this interval not displayed. Glucose  Recent Labs Lab  06/08/13 1526 06/08/13 1710 06/08/13 1927 06/08/13 2348 06/09/13 0433 06/09/13 0757  GLUCAP 205* 195* 190* 72 106* 117*   CXR: None today  ASSESSMENT / PLAN:  PULMONARY A: COPD without evidence of exacerbation / acute bronchospasm.  Acute pulmonary edema - resolved.  Acute respiratory failure - resolved P:   Goal SpO2>92 Supplemental oxygen PRN Albuterol / Atrovent nebs Avoid steroids as hyperglycemic  CARDIOVASCULAR A: Acute on chronic systolic / diastolic congestive heart failure. Hypertensive emergency.  Demand ischemia vs NSTEMI. P:  Goal BP<140/80 ASA Start Heparin per ACS, stop after 48 hours Coreg, Valsartan No Lipitor Cardiac cath today  RENAL A:  AKI, improved.  Hypokalemia. P:   Trend BMP K 40 x 1 Start NS@100  as expected to get contras load  GASTROINTESTINAL A:  No active issues. P:   NPO for procedure Protonix as  preadmission  HEMATOLOGIC A:  Therapeutic anticoagulation for ACS. P:  D/c Heparin [place] as on gtt  INFECTIOUS A:  Doubt pneumonia as WBC / PCT reassuring. P:   Defer abx  ENDOCRINE A:  DM.  Hyperglycemia - resolving.  Suspected DKA, but negative ketones.  P:   SSI Lantus 10 Continue Amaryl Hold Janumet as to cath lab today  NEUROLOGIC A:  Acute encephalopathy - resolved. P:   No intervention required  I have personally obtained history, examined patient, evaluated and interpreted laboratory and imaging results, reviewed medical records, formulated assessment / plan and placed orders.  Doree Fudge, MD Pulmonary and [place] Pager: [phone]  06/09/2013, 9:07 AM

## 2013-06-10 LAB — GLUCOSE, CAPILLARY
Glucose-Capillary: 136 mg/dL — ABNORMAL HIGH (ref 70–99)
Glucose-Capillary: 184 mg/dL — ABNORMAL HIGH (ref 70–99)
Glucose-Capillary: 211 mg/dL — ABNORMAL HIGH (ref 70–99)
Glucose-Capillary: 237 mg/dL — ABNORMAL HIGH (ref 70–99)
Glucose-Capillary: 155 mg/dL — ABNORMAL HIGH (ref 70–99)

## 2013-06-10 LAB — BASIC METABOLIC PANEL
BUN: 16 mg/dL (ref 6–23)
CO2: 26 mEq/L (ref 19–32)
Calcium: 8.6 mg/dL (ref 8.4–10.5)
Chloride: 108 mEq/L (ref 96–112)
Creatinine, Ser: 1.12 mg/dL — ABNORMAL HIGH (ref 0.50–1.10)
GFR calc Af Amer: 60 mL/min — ABNORMAL LOW (ref >90)
GFR calc non Af Amer: 52 mL/min — ABNORMAL LOW (ref >90)
Glucose, Bld: 152 mg/dL — ABNORMAL HIGH (ref 70–99)
Potassium: 3.5 mEq/L (ref 3.5–5.1)
Sodium: 144 mEq/L (ref 135–145)

## 2013-06-10 LAB — CBC
HCT: 35.7 % — ABNORMAL LOW (ref 36.0–46.0)
Hemoglobin: 11.4 g/dL — ABNORMAL LOW (ref 12.0–15.0)
MCH: 27.7 pg (ref 26.0–34.0)
MCHC: 31.9 g/dL (ref 30.0–36.0)
MCV: 86.9 fL (ref 78.0–100.0)
Platelets: 157 10*3/uL (ref 150–400)
RBC: 4.11 MIL/uL (ref 3.87–5.11)
RDW: 14.2 % (ref 11.5–15.5)
WBC: 5.6 10*3/uL (ref 4.0–10.5)

## 2013-06-10 MED ORDER — ALBUTEROL SULFATE (5 MG/ML) 0.5% IN NEBU: 2.5000 mg | INHALATION_SOLUTION | RESPIRATORY_TRACT | Status: DC | PRN

## 2013-06-10 MED ORDER — CARVEDILOL 6.25 MG PO TABS
6.2500 mg | ORAL_TABLET | Freq: Two times a day (BID) | ORAL | Status: DC
  Administered 2013-06-10: 6.25 mg via ORAL
  Filled 2013-06-10 (×4): qty 1

## 2013-06-10 MED ORDER — IPRATROPIUM BROMIDE 0.02 % IN SOLN: 0.5000 mg | Freq: Three times a day (TID) | RESPIRATORY_TRACT | Status: DC

## 2013-06-10 MED ORDER — FUROSEMIDE 10 MG/ML IJ SOLN
40.0000 mg | Freq: Once | INTRAMUSCULAR | Status: AC
  Administered 2013-06-10: 40 mg via INTRAVENOUS

## 2013-06-10 MED ORDER — ALBUTEROL SULFATE (5 MG/ML) 0.5% IN NEBU: 2.5000 mg | INHALATION_SOLUTION | Freq: Three times a day (TID) | RESPIRATORY_TRACT | Status: DC

## 2013-06-10 MED ORDER — AMLODIPINE BESYLATE 10 MG PO TABS
10.0000 mg | ORAL_TABLET | Freq: Every day | ORAL | Status: DC
  Administered 2013-06-10 – 2013-06-11 (×2): 10 mg via ORAL
  Filled 2013-06-10 (×2): qty 1

## 2013-06-10 MED ORDER — ACETAMINOPHEN 325 MG PO TABS: 650.0000 mg | ORAL_TABLET | Freq: Four times a day (QID) | ORAL | Status: DC | PRN

## 2013-06-10 MED ORDER — FUROSEMIDE 40 MG PO TABS
40.0000 mg | ORAL_TABLET | Freq: Every day | ORAL | Status: DC
  Administered 2013-06-10 – 2013-06-11 (×2): 40 mg via ORAL
  Filled 2013-06-10 (×2): qty 1

## 2013-06-10 NOTE — Plan of Care (Signed)
Problem: Phase II Progression Outcomes Goal: Cath/PCI Day Path if indicated Outcome: Not Applicable Date Met:  [id number] Had cath via right radial site 06/09/13 Goal: Cardiac Rehab if ordered Outcome: Completed/Met Date Met:  06/10/13 Cardiac Rehab ordered

## 2013-06-10 NOTE — Progress Notes (Signed)
Thermal Progress Note Patient Name: [name] DOB: 07/03/1951 MRN: [id number]  Date of Service  06/10/2013   HPI/Events of Note  C/o dyspnea, rales on exam  eICU Interventions  One dose lasix IV   Intervention Category Major Interventions: Respiratory failure - evaluation and management  Asencion Noble 06/10/2013, 12:34 AM

## 2013-06-10 NOTE — Progress Notes (Signed)
CARDIAC REHAB PHASE I   PRE:  Rate/Rhythm: 106 sinus  BP:  Supine:   Sitting: 164/76  Standing:    SaO2: 99% ra  MODE:  Ambulation: 350 ft   POST:  Rate/Rhythem: 112 sinus   BP:  Supine:   Sitting: 170/85  Standing:    SaO2: 98% ra  1308-1415  Pt ambulated in hallway without difficulty, steady gait.  Pt denies pain or dyspnea.  Education completed including risk factor modification, low fat low chol diet and antiplatelet therapy.  Pt oriented to CRP II.  At pt request referral will be sent to [place].  Understanding verbalized   Carolyne Littles

## 2013-06-10 NOTE — Progress Notes (Signed)
PULMONARY  / CRITICAL CARE MEDICINE  Name: [name] MRN: [id number] DOB: 05/22/1951    ADMISSION DATE:  06/06/2013 CONSULTATION DATE:  06/06/2013  PRIMARY SERVICE: PCCM  CHIEF COMPLAINT:  Respiratory distress  BRIEF PATIENT DESCRIPTION: 62 yo with COPD and CHF admitted with bilateral pulmonary infiltrates and acute respiratory failure.  SIGNIFICANT EVENTS / STUDIES:  2023/07/01  CT head >>> nad 07-01-23  TTE >>> LFH, EF 99991111, diastolic dysfunction  123456  Weaned / extubated 12/05  Cardiac cath: 90-95% mid LAD lesion. PCI/stent  LINES / TUBES: OETT 12/02 >>> 12/04 OGT  12/02 >>> 12/04 Foley  12/02 >>> 12/05  CULTURES: 2023/07/01  Blood >>> NEG 07-01-23  Respiratory  >>> NEG  ANTIBIOTICS:  SUBJECTIVE:   Tearful, wants to go home. No new complaints  VITAL SIGNS: Temp:  [98.4 F (36.9 C)-99.6 F (37.6 C)] 98.9 F (37.2 C) (12/06 1453) Pulse Rate:  [97-109] 107 (12/06 1453) Resp:  [18-23] 18 (12/06 1453) BP: (148-174)/(52-89) 150/80 mmHg (12/06 1453) SpO2:  [100 %] 100 % (12/06 1453) Weight:  [84.5 kg (186 lb 4.6 oz)-85 kg (187 lb 6.3 oz)] 84.5 kg (186 lb 4.6 oz) (12/06 1453) HEMODYNAMICS:   VENTILATOR SETTINGS:   INTAKE / OUTPUT: Intake/Output     12/05 0701 - 12/06 0700 12/06 0701 - 12/07 0700   P.O. 100 540   I.V. (mL/kg) 948.3 (11.2)    NG/GT     IV Piggyback     Total Intake(mL/kg) 1048.3 (12.3) 540 (6.4)   Urine (mL/kg/hr) 2875 (1.4) 550 (0.6)   Total Output 2875 550   Net -1826.7 -10        Stool Occurrence 1 x      PHYSICAL EXAMINATION: General:  NAD Neuro: no deficits HEENT:  PERRL Cardiovascular:  RRR, no m/r/g Lungs:  Bilateral vesicular breath sounds Abdomen:  Soft, nontender, bowel sounds diminished Musculoskeletal:  Bilateral LE pitting edema  LABS: I have reviewed all of today's lab results. Relevant abnormalities are discussed in the A/P section   CXR: None today  ASSESSMENT / PLAN:  PULMONARY A: COPD without bronchospasm.   Acute  pulmonary edema - resolved.   Acute respiratory failure - resolved P:   Monitor off O2  CARDIOVASCULAR A: Acute on chronic systolic / diastolic congestive heart failure.  Hypertensive emergency NSTEMI CAD, s/p stent 12/05 P:  Cont current Rx  RENAL A:  AKI, improved.   Hypokalemia, resolved P:   Monitor BMET intermittently Correct electrolytes as indicated   GASTROINTESTINAL A:  No active issues. P:   Diet as ordered  HEMATOLOGIC A: Mild anemia without overt bleeding P:  Monitor  ENDOCRINE A:  DM II  P:   Cont current Rx  NEUROLOGIC A:  Acute encephalopathy - resolved. P:   No intervention required  Transfer to Tele  She can be discharged home 12/07 if ok with Cards  Merton Border, MD Pulmonary and [place] Pager: [phone]  06/10/2013, 6:10 PM

## 2013-06-10 NOTE — Progress Notes (Addendum)
PROGRESS NOTE  Subjective:    Mr. Erb is a 62 yo with hx of smoking, COPD and respiratory failure with NSTEMI.  Cath 12/5:  Left Main: short , no significant stenosis  Left anterior Descending: normal proximally, the mid segment has a tight 95% stenosis at 2nd diagonal  Left Circumflex: moderate diffuse irregularities., 40-50%  Right Coronary Artery: Large, dominant, Moderate irregularities  LV Gram: overall normal LV function. Apical severe hypokinesis, EF 55% LVEDP 11  Underwent PCI with DES to LAD yesterday.  Last night developed respiratory distress and received IV lasix. -1.8. Breathing better today but still breathless. No CP. Wrist site ok.    Objective:    Vital Signs:   Temp:  [97.6 F (36.4 C)-98.5 F (36.9 C)] 98.5 F (36.9 C) (12/06 0716) Pulse Rate:  [93-109] 108 (12/06 1000) Resp:  [11-26] 18 (12/06 0338) BP: (136-179)/(52-120) 148/52 mmHg (12/06 0338) SpO2:  [100 %] 100 % (12/06 1000) Weight:  [85 kg (187 lb 6.3 oz)] 85 kg (187 lb 6.3 oz) (12/06 0500)  Last BM Date: 06/09/13   24-hour weight change: Weight change: -0.2 kg (-7.1 oz)  Weight trends: Filed Weights   06/08/13 0343 06/09/13 0500 06/10/13 0500  Weight: 86.9 kg (191 lb 9.3 oz) 85.2 kg (187 lb 13.3 oz) 85 kg (187 lb 6.3 oz)    Intake/Output:  12/05 0701 - 12/06 0700 In: 1048.3 [P.O.:100; I.V.:948.3] Out: 2875 [Urine:2875] Total I/O In: 120 [P.O.:120] Out: -    Physical Exam: BP 148/52  Pulse 108  Temp(Src) 98.5 F (36.9 C) (Oral)  Resp 18  Ht 5\' 3"  (1.6 m)  Wt 85 kg (187 lb 6.3 oz)  BMI 33.20 kg/m2  SpO2 100%  Mildly breathless. NAD General: Vital signs reviewed and noted.   Head: Normocephalic, atraumatic.  Eyes: Normal  Throat: normal  Neck:  normal. JVP 6  Lungs:  Decreased BS. Long exp phase.   Heart:  RRR  Abdomen:  Soft, non-tender, non-distended    Extremities: No edema   Neurologic: A&O X3, CN II - XII are grossly intact.   Psych: Normal      Labs: BMET:  Recent Labs  06/09/13 0600 06/10/13 0335  NA 140 144  K 3.3* 3.5  CL 105 108  CO2 23 26  GLUCOSE 118* 152*  BUN 18 16  CREATININE 1.10 1.12*  CALCIUM 8.4 8.6    Liver function tests: No results found for this basename: AST, ALT, ALKPHOS, BILITOT, PROT, ALBUMIN,  in the last 72 hours No results found for this basename: LIPASE, AMYLASE,  in the last 72 hours  CBC:  Recent Labs  06/09/13 0600 06/10/13 0335  WBC 5.6 5.6  HGB 10.9* 11.4*  HCT 33.1* 35.7*  MCV 85.8 86.9  PLT 134* 157    Cardiac Enzymes:  Recent Labs  06/07/13 1450 06/07/13 1920 06/08/13 0150 06/08/13 0740  TROPONINI 3.70* 3.21* 3.03* 0.93*    Coagulation Studies:  Recent Labs  06/07/13 1920  LABPROT 14.6  INR 1.16    Other: No components found with this basename: POCBNP,  No results found for this basename: DDIMER,  in the last 72 hours No results found for this basename: HGBA1C,  in the last 72 hours No results found for this basename: CHOL, HDL, LDLCALC, TRIG, CHOLHDL,  in the last 72 hours No results found for this basename: TSH, T4TOTAL, FREET3, T3FREE, THYROIDAB,  in the last 72 hours No results found for this basename: VITAMINB12, FOLATE,  FERRITIN, TIBC, IRON, RETICCTPCT,  in the last 72 hours   Other results: Tele:  NSR  Medications:    Infusions: . sodium chloride Stopped (06/09/13 1900)    Scheduled Medications: . albuterol  2.5 mg Nebulization TID  . aspirin  81 mg Oral Daily  . carvedilol  3.125 mg Oral BID WC  . glimepiride  4 mg Oral BID WC  . heparin  5,000 Units Subcutaneous Q8H  . insulin aspart  0-15 Units Subcutaneous TID WC & HS  . insulin glargine  10 Units Subcutaneous QHS  . ipratropium  0.5 mg Nebulization TID  . irbesartan  300 mg Oral Daily  . pantoprazole  40 mg Oral QHS  . prasugrel  10 mg Oral Daily    Assessment/ Plan:   Active Problems:   DIABETES MELLITUS, TYPE II   HYPERTENSION   COPD   Acute respiratory failure    Hypertensive crisis   Acute diastolic CHF (congestive heart failure)   Metabolic acidosis   NSTEMI s/p DES to LAD 12/5  She is s/p stent to LAD. Remains mildly SOB but suspect this is related to her underlying COPD. Will start daily lasix. Add amlodipine to help with HTN. Repeat CXR in am.   Continue DAPT x 1 year.    Disposition:  Length of Stay: [place] Yuliana Vandrunen,MD 11:51 AM

## 2013-06-10 NOTE — Progress Notes (Signed)
Radial site Care instruction sheet reviewed with patient and paper copy left at bedside.  Pt stated understanding of precautions.  Radial cath site open to air and at level 0.

## 2013-06-10 NOTE — Progress Notes (Signed)
"  Living better with Heart Failure Booklet" taken to pt.  Offered to set up Heart Failure video. Pt declined at present stating need to rest.  Instructed to inform staff when ready to see video.

## 2013-06-10 NOTE — Progress Notes (Signed)
Received [name] to [phone] @ 1445.  Arrived via wheelchair transport.  No c/o pain.  Skin warm and dry.  Teachback used for how to use call bell.  Instructed to leave all urine in collection container in bathroom for [place] to measure output.

## 2013-06-11 ENCOUNTER — Inpatient Hospital Stay (HOSPITAL_COMMUNITY): Payer: Self-pay

## 2013-06-11 ENCOUNTER — Encounter (HOSPITAL_COMMUNITY): Payer: Self-pay | Admitting: Internal Medicine

## 2013-06-11 DIAGNOSIS — I2510 Atherosclerotic heart disease of native coronary artery without angina pectoris: Secondary | ICD-10-CM

## 2013-06-11 DIAGNOSIS — I255 Ischemic cardiomyopathy: Secondary | ICD-10-CM

## 2013-06-11 DIAGNOSIS — I2589 Other forms of chronic ischemic heart disease: Secondary | ICD-10-CM

## 2013-06-11 DIAGNOSIS — I214 Non-ST elevation (NSTEMI) myocardial infarction: Secondary | ICD-10-CM

## 2013-06-11 DIAGNOSIS — I5042 Chronic combined systolic (congestive) and diastolic (congestive) heart failure: Secondary | ICD-10-CM

## 2013-06-11 HISTORY — DX: Non-ST elevation (NSTEMI) myocardial infarction: I21.4

## 2013-06-11 HISTORY — DX: Ischemic cardiomyopathy: I25.5

## 2013-06-11 LAB — GLUCOSE, CAPILLARY: Glucose-Capillary: 124 mg/dL — ABNORMAL HIGH (ref 70–99)

## 2013-06-11 MED ORDER — ASPIRIN 81 MG PO CHEW: 81.0000 mg | CHEWABLE_TABLET | Freq: Every day | ORAL | Status: DC

## 2013-06-11 MED ORDER — PRASUGREL HCL 10 MG PO TABS: 10.0000 mg | ORAL_TABLET | Freq: Every day | ORAL | Status: DC

## 2013-06-11 MED ORDER — IRBESARTAN 300 MG PO TABS: 300.0000 mg | ORAL_TABLET | Freq: Every day | ORAL | Status: DC

## 2013-06-11 MED ORDER — PROMETHAZINE-CODEINE 6.25-10 MG/5ML PO SYRP: 5.0000 mL | ORAL_SOLUTION | Freq: Four times a day (QID) | ORAL | Status: DC | PRN

## 2013-06-11 MED ORDER — NICOTINE 21 MG/24HR TD PT24
21.0000 mg | MEDICATED_PATCH | Freq: Every day | TRANSDERMAL | Status: DC
Start: 2013-06-11 — End: 2013-06-11
  Filled 2013-06-11: qty 1

## 2013-06-11 MED ORDER — ROSUVASTATIN CALCIUM 5 MG PO TABS: 5.0000 mg | ORAL_TABLET | ORAL | Status: DC

## 2013-06-11 MED ORDER — AMLODIPINE BESYLATE 10 MG PO TABS: 10.0000 mg | ORAL_TABLET | Freq: Every day | ORAL | Status: DC

## 2013-06-11 MED ORDER — ROSUVASTATIN CALCIUM 5 MG PO TABS
5.0000 mg | ORAL_TABLET | ORAL | Status: DC
  Filled 2013-06-11: qty 1

## 2013-06-11 MED ORDER — CARVEDILOL 12.5 MG PO TABS: 12.5000 mg | ORAL_TABLET | Freq: Two times a day (BID) | ORAL | Status: DC

## 2013-06-11 MED ORDER — CARVEDILOL 12.5 MG PO TABS
12.5000 mg | ORAL_TABLET | Freq: Two times a day (BID) | ORAL | Status: DC
  Filled 2013-06-11 (×2): qty 1

## 2013-06-11 MED ORDER — ATORVASTATIN CALCIUM 10 MG PO TABS: 10.0000 mg | ORAL_TABLET | ORAL | Status: DC

## 2013-06-11 MED ORDER — GLIMEPIRIDE 4 MG PO TABS: 4.0000 mg | ORAL_TABLET | Freq: Two times a day (BID) | ORAL | Status: DC

## 2013-06-11 NOTE — Discharge Summary (Signed)
Physician Discharge Summary     Patient ID: [name] MRN: [id number] DOB/AGE: Mar 02, 1951 62 y.o.  Admit date: 06/06/2013 Discharge date: 06/11/2013  Discharge Diagnoses:  Active Problems:   DIABETES MELLITUS, TYPE II   COPD   Hypertensive crisis   NSTEMI (non-ST elevated myocardial infarction)   Ischemic cardiomyopathy  Detailed Hospital Course:   62 year old smoker with history of diabetes, hypertension, colon cancer and reported COPD. She was last seen well by a friend on 11/29. On 12/2 she was noted by her neighbor to be in respiratory distress. She reportedly was unable to speak due to work of breathing. EMS was activated and on their arrival she was hypoxemic with evolving respiratory failure. In the [facility] emergency department she was barely unresponsive, hypertensive to 240-250 mmHg SBP. CXR revealed bilateral interstitial and alveolar infiltrates. CBG was > 600. She required intubation and mechanical ventilation. Blood pressure was controlled once IV sedation was initiated. She received Lasix 60 mg with 800 cc diuresis.   She was admitted to the intensive care. Diagnotic and Therapeutic interventions included: cycling cardiac enzymes, which were positive. Echo, which showed systolic and diastolic dysfxn 99991111. It was felt these findings were consistent with NSTEMI. Her blood pressure was controlled. Diuresis maintained. We were able to extubate on 12/4 to nasal canula.  Cardiology was consulted. She underwent left heart cath on 12/5. Found to have 90-95% occluded LAD and had PCI and stenting. From a pulmonary stand-point she was stable. On 12/6 she was moved to telemetry and as of 12/7 she was seen by cardiology for final recommendations and cleared for discharge. At time of discharge she will be released with plan as outlined in the active problem list below.     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  Cards will contact for f/u  COPD without bronchospasm P: No rx  PRN cough suppression   Mild anemia without overt bleeding  P:  Monitor  DM II  P:  Resume oral regimen, she states usually runs low 100s. Have instructed her to continue to monitor her Blood sugar    Significant Hospital tests/ studies/ interventions and procedures  Consults: cardiology   SIGNIFICANT EVENTS / STUDIES:  07/08/23 CT head >>> nad  07/08/2023 TTE >>> LFH, EF 99991111, diastolic dysfunction  123456 Weaned / extubated  12/05 Cardiac cath: 90-95% mid LAD lesion. PCI/stent   LINES / TUBES:  OETT 12/02 >>> 12/04  OGT 12/02 >>> 12/04  Foley 12/02 >>> 12/05   CULTURES:  2023/07/08 Blood >>> NEG  07/08/2023 Respiratory >>> NEG  Discharge Exam: BP 145/62  Pulse 92  Temp(Src) 98.6 F (37 C) (Oral)  Resp 20  Ht 5\' 2"  (1.575 m)  Wt 83.8 kg (184 lb 11.9 oz)  BMI 33.78 kg/m2  SpO2 100% Room air  General: NAD  Neuro: no deficits  HEENT: PERRL  Cardiovascular: RRR, no m/r/g  Lungs: Bilateral vesicular breath sounds  Abdomen: Soft, nontender, bowel sounds diminished  Musculoskeletal: Bilateral LE pitting edema  Labs at discharge Lab Results  Component Value Date   CREATININE 1.12* 06/10/2013   BUN 16 06/10/2013   NA 144 06/10/2013   K 3.5 06/10/2013   CL 108 06/10/2013   CO2 26 06/10/2013   Lab Results  Component  Value Date   WBC 5.6 06/10/2013   HGB 11.4* 06/10/2013   HCT 35.7* 06/10/2013   MCV 86.9 06/10/2013   PLT 157 06/10/2013   Lab Results  Component Value Date   ALT 31 06/06/2013   AST 68* 06/06/2013   ALKPHOS 224* 06/06/2013   BILITOT 0.2* 06/06/2013   Lab Results  Component Value Date   INR 1.16 06/07/2013   CBG (last 3)   Recent Labs  06/10/13 1622 06/10/13 2112 06/11/13 1116  GLUCAP 237* 155* 124*    Current  radiology studies Dg Chest 2 View  06/11/2013   CLINICAL DATA:  Dyspnea  EXAM: CHEST  2 VIEW  COMPARISON:  06/08/2013  FINDINGS: Cardiomediastinal silhouette is stable. Endotracheal tube has been removed. No pulmonary edema. There is trace left pleural effusion with left basilar atelectasis.  IMPRESSION: Endotracheal tube has been removed. Trace left pleural effusion with left basilar atelectasis. No pulmonary edema.   Electronically Signed   By: Lahoma Crocker M.D.   On: 06/11/2013 09:23    Disposition:  Final discharge disposition not confirmed      Discharge Orders   Future Orders Complete By Expires   Amb Referral to Cardiac Rehabilitation  As directed        Medication List         amLODipine 10 MG tablet  Commonly known as:  NORVASC  Take 1 tablet (10 mg total) by mouth daily.     aspirin 81 MG chewable tablet  Chew 1 tablet (81 mg total) by mouth daily.     CALCIUM PO  Take 1 tablet by mouth daily.     carvedilol 12.5 MG tablet  Commonly known as:  COREG  Take 1 tablet (12.5 mg total) by mouth 2 (two) times daily with a meal.     furosemide 80 MG tablet  Commonly known as:  LASIX  Take 40 mg by mouth daily.     glimepiride 4 MG tablet  Commonly known as:  AMARYL  Take 1 tablet (4 mg total) by mouth 2 (two) times daily with a meal.     irbesartan 300 MG tablet  Commonly known as:  AVAPRO  Take 1 tablet (300 mg total) by mouth daily.     prasugrel 10 MG Tabs tablet  Commonly known as:  EFFIENT  Take 1 tablet (10 mg total) by mouth daily.     promethazine-codeine 6.25-10 MG/5ML syrup  Commonly known as:  PHENERGAN with CODEINE  Take 5 mLs by mouth every 6 (six) hours as needed for cough.     rosuvastatin 5 MG tablet  Commonly known as:  CRESTOR  Take 1 tablet (5 mg total) by mouth every 3 (three) days.     vitamin B-12 1000 MCG tablet  Commonly known as:  CYANOCOBALAMIN  Take 1,000 mcg by mouth daily.       Follow-up Information   Follow up with Walker Kehr, MD.   Specialty:  Internal Medicine   Contact information:   [address] [place] [address] [phone]       Discharged Condition: good  Physician Statement:   The Patient was personally examined, the discharge assessment and plan has been personally reviewed and I agree with ACNP Babcock's assessment and plan. > 30 minutes of time have been dedicated to discharge assessment, planning and discharge instructions.   Signed: BABCOCK,PETE 06/11/2013, 12:07 PM    Agree with above. Discussed with Dr Haroldine Laws  Eunice Winecoff, MD ; PCCM service Mobile [phone].  After 5:30 PM or weekends, call [phone]  

## 2013-06-11 NOTE — Progress Notes (Signed)
   CARE MANAGEMENT NOTE 06/11/2013  Patient:  [name], [name]   Account Number:  [id number]  Date Initiated:  06/07/2013  Documentation initiated by:  [facility]  Subjective/Objective Assessment:   Admitted with SOB - intubated.     Action/Plan:   Anticipated DC Date:  06/14/2013   Anticipated DC Plan:  [place]  CM consult  [facility] [facility]      Choice offered to / List presented to:             Status of service:  Completed, signed off Medicare Important Message given?   (If response is "NO", the following Medicare IM given date fields will be blank) Date Medicare IM given:   Date Additional Medicare IM given:    Discharge Disposition:  HOME/SELF CARE  Per UR Regulation:  Reviewed for med. necessity/level of care/duration of stay  If discussed at [place] of Stay Meetings, dates discussed:    Comments:  06/11/13 13:20 CM called to offer med asst.  CM brought [place] letter to pt with list of participating pharmacies. Pt also given [place] for follow up care.  Pt states she has signed up for Affordable Care and it will become effective in January 2015.  No other needs were communicated.  Mariane Masters, BSN, CM [phone].  Contact:  [name],[name] Other [phone]

## 2013-06-11 NOTE — Progress Notes (Signed)
Patient Name: [name]      SUBJECTIVE: 54 female admitted with long standing dyspnea 2/2 COPD admitted with worsening of sob;  Echo LV dysfunction and with multiple CRF nSTEMI  underwent cath>>95% LAD Rx w DES  DAPT for 1 year  Still smoking       Past Medical History  Diagnosis Date  . Anemia   . Personal history of colon cancer   . Diabetes mellitus   . Hyperlipidemia   . Hypertension   . Low back pain   . Obesity   . COPD (chronic obstructive pulmonary disease)   . Renal insufficiency   . NSTEMI (non-ST elevated myocardial infarction) 06/11/2013  . Ischemic cardiomyopathy 06/11/2013    Scheduled Meds:  Scheduled Meds: . amLODipine  10 mg Oral Daily  . aspirin  81 mg Oral Daily  . carvedilol  12.5 mg Oral BID WC  . furosemide  40 mg Oral Daily  . glimepiride  4 mg Oral BID WC  . insulin aspart  0-15 Units Subcutaneous TID WC & HS  . insulin glargine  10 Units Subcutaneous QHS  . irbesartan  300 mg Oral Daily  . nicotine  21 mg Transdermal Daily  . prasugrel  10 mg Oral Daily   Continuous Infusions: . sodium chloride Stopped (06/09/13 1900)    PHYSICAL EXAM Filed Vitals:   06/10/13 1150 06/10/13 1453 06/10/13 2016 06/11/13 0524  BP:  150/80 138/84 169/73  Pulse:  107 100 97  Temp: 99.6 F (37.6 C) 98.9 F (37.2 C) 98.5 F (36.9 C) 98.6 F (37 C)  TempSrc: Oral Oral Oral Oral  Resp:  18 20 20   Height:  5\' 2"  (1.575 m)    Weight:  186 lb 4.6 oz (84.5 kg)  184 lb 11.9 oz (83.8 kg)  SpO2: 100% 100% 100% 100%   Well developed and nourished in no acute distress HENT normal Neck supple Clear Regular rate and rhythm but rapid, no murmurs or gallops Abd-soft with active BS No Clubbing cyanosis edema Skin-warm and dry A & Oriented  Grossly normal sensory and motor function  TELEMETRY: Reviewed telemetry pt in sinus HR 95-105   Intake/Output Summary (Last 24 hours) at 06/11/13 0936 Last data filed at 06/11/13 0657  Gross per 24 hour   Intake    420 ml  Output   1150 ml  Net   -730 ml    LABS: Basic Metabolic Panel:  Recent Labs Lab 06/06/13 2248 06/06/13 2307 06/07/13 0340 06/08/13 0510 06/09/13 0600 06/10/13 0335  NA 131* 136 140 140 140 144  K 3.2* 3.2* 3.2* 3.3* 3.3* 3.5  CL 94* 99 103 105 105 108  CO2 20  --  24 24 23 26   GLUCOSE 648* 664* 400* 200* 118* 152*  BUN 14 17 16 23 18 16   CREATININE 0.88 1.10 1.28* 1.35* 1.10 1.12*  CALCIUM 8.3*  --  8.5 8.4 8.4 8.6  MG  --   --  1.8  --   --   --   PHOS  --   --  3.1  --   --   --    Cardiac Enzymes: No results found for this basename: CKTOTAL, CKMB, CKMBINDEX, TROPONINI,  in the last 72 hours CBC:  Recent Labs Lab 06/06/13 2248 06/06/13 2307 06/07/13 0340 06/08/13 0510 06/09/13 0600 06/10/13 0335  WBC 12.5*  --  9.0 7.6 5.6 5.6  NEUTROABS 4.1  --   --   --   --   --  HGB 15.7* 15.6* 12.3 11.3* 10.9* 11.4*  HCT 46.4* 46.0 37.2 34.5* 33.1* 35.7*  MCV 87.4  --  86.1 85.2 85.8 86.9  PLT 222  --  161 132* 134* 157   BNP: BNP (last 3 results)  Recent Labs  06/06/13 2245 06/07/13 0340  PROBNP 1477.0* 2846.0*    ASSESSMENT AND PLAN:  Active Problems:   DIABETES MELLITUS, TYPE II   COPD   Hypertensive crisis   NSTEMI (non-ST elevated myocardial infarction)   Ischemic cardiomyopathy Ischemic Cardiomyopathy Sinus tachycardia   on arb/bb DAPT Increase coreg 2/2 sinus tach Begin crestor 2 days week (atorv started in house  All i could do but would switch in am) Stop smoking  Signed, Virl Axe MD  06/11/2013

## 2013-06-11 NOTE — Progress Notes (Signed)
Patient discharged to home.  IV removed prior to discharge, IV site clean, dry, and intact.  Discharge teaching performed prior to discharge; patient voiced understanding of discharge instructions.

## 2013-06-12 MED FILL — Sodium Chloride IV Soln 0.9%: INTRAVENOUS | Qty: 50 | Status: AC

## 2013-06-13 ENCOUNTER — Telehealth: Payer: Self-pay | Admitting: Cardiovascular Disease

## 2013-06-13 LAB — CULTURE, BLOOD (ROUTINE X 2)
Culture: NO GROWTH
Culture: NO GROWTH

## 2013-06-13 NOTE — Telephone Encounter (Signed)
New problem:  Per after hours voice mail... TCM.

## 2013-06-14 NOTE — Telephone Encounter (Signed)
Appt with Dr. Acie Fredrickson 12/16 @ 0915; D/C'ed from [facility] 12/7

## 2013-06-16 NOTE — Telephone Encounter (Signed)
TCM call to patient she is feeling good.Stated she understands discharge instructions and is taking medications.Advised to keep appointment Dr.Nahser 06/20/13.

## 2013-06-20 ENCOUNTER — Encounter: Payer: Self-pay | Admitting: Cardiovascular Disease

## 2013-06-20 ENCOUNTER — Ambulatory Visit (INDEPENDENT_AMBULATORY_CARE_PROVIDER_SITE_OTHER): Payer: Self-pay | Admitting: Cardiovascular Disease

## 2013-06-20 VITALS — BP 164/98 | HR 92 | Ht 62.0 in | Wt 186.8 lb

## 2013-06-20 DIAGNOSIS — E785 Hyperlipidemia, unspecified: Secondary | ICD-10-CM

## 2013-06-20 DIAGNOSIS — I2510 Atherosclerotic heart disease of native coronary artery without angina pectoris: Secondary | ICD-10-CM

## 2013-06-20 MED ORDER — CARVEDILOL 25 MG PO TABS: 25.0000 mg | ORAL_TABLET | Freq: Two times a day (BID) | ORAL | Status: DC

## 2013-06-20 NOTE — Assessment & Plan Note (Signed)
Jaden has not had any episodes of chest discomfort. She did have some back pain this morning that resolved after she drank some clothes out of.  We'll continue with current medications. She needs to watch her salt intake. [name] exercising on regular basis-walks inside at the mall or at [place] on a daily basis.  We'll continue with her same medications. She has not smoked since she left the hospital last week.  We will see  her in 3 months for OV and fasting labs.

## 2013-06-20 NOTE — Assessment & Plan Note (Signed)
Her blood pressure remains elevated. We will increase her carvedilol to 25 mg twice day. She still eats some extra salt. We'll have her reduce her salt intake.

## 2013-06-20 NOTE — Patient Instructions (Addendum)
Your physician has recommended you make the following change in your medication:   INCREASE COREG/ CARVEDILOL TO 25 MG TWICE DAILY/ 12 HOURS APART  REDUCE HIGH SODIUM FOODS LIKE CANNED SOUP, GRAVY, SAUCES, READY PREPARED FOODS LIKE FROZEN FOODS; LEAN CUISINE, LASAGNA. BACON, SAUSAGE, LUNCH MEAT, FAST FOODS, HOT DOGS, CHIPS, PIZZA, CHINESE FOOD, SOY SAUCE, STORE BOUGHT FRIED CHICKEN= [place] FRIED CHICKEN/ BOJANGLES.   Your physician recommends that you schedule a follow-up appointment in: [place] physician recommends that you return for a FASTING lipid profile: 3 MONTHS

## 2013-06-20 NOTE — Progress Notes (Signed)
[name] Date of Birth  25-Nov-1950       [name]    [employer] [address], [place], [place]  [address]   [place], [place]  [address] [phone]     [phone]   Fax  [phone]    Fax [phone]  Problem List: 1. Coronary artery disease-status post PTCA and stenting of her mid LAD using a 3.5 x 15 mm Alpine stent ( DES)  2. chronic systolic congestive heart failure-ejection fraction of 35% 2. Hypertension 3. COPD 4. Diabetes mellitus 5. History colon cancer   History of Present Illness:  This [name] is seen today. She was initially seen by Dr. Mare Ferrari in the hospital. She has a history of chronic systolic congestive heart failure with an ejection fraction of around 35%. She has a history of coronary artery disease. She is status post PTCA and stenting of her mid left anterior descending artery using a 3.5 x 15 mm Alpine stent.   She has a history of COPD. [name] not had a cigarette since he left the hospital. She also has a history of diabetes mellitus and history of colon cancer  She has mild interscapular pain this morning .  And the pain resolved after she drank som club soda.  Did not feel like her MI pain.   She has been walking every morining at the mall and at [place].   Her BP is up today.   - she may have had lots of salt last night.   The BP has remained high    Current Outpatient Prescriptions on File Prior to Visit  Medication Sig Dispense Refill  . amLODipine (NORVASC) 10 MG tablet Take 1 tablet (10 mg total) by mouth daily.  30 tablet  6  . aspirin 81 MG chewable tablet Chew 1 tablet (81 mg total) by mouth daily.      [name] CALCIUM PO Take 1 tablet by mouth daily.      . carvedilol (COREG) 12.5 MG tablet Take 1 tablet (12.5 mg total) by mouth 2 (two) times daily with a meal.  60 tablet  6  . furosemide (LASIX) 80 MG tablet Take 40 mg by mouth daily.        [name] glimepiride (AMARYL) 4 MG  tablet Take 1 tablet (4 mg total) by mouth 2 (two) times daily with a meal.  30 tablet  6  . irbesartan (AVAPRO) 300 MG tablet Take 1 tablet (300 mg total) by mouth daily.  30 tablet  6  . prasugrel (EFFIENT) 10 MG TABS tablet Take 1 tablet (10 mg total) by mouth daily.  30 tablet  6  . promethazine-codeine (PHENERGAN WITH CODEINE) 6.25-10 MG/5ML syrup Take 5 mLs by mouth every 6 (six) hours as needed for cough.  120 mL  0  . rosuvastatin (CRESTOR) 5 MG tablet Take 1 tablet (5 mg total) by mouth every 3 (three) days.  10 tablet  6  . vitamin B-12 (CYANOCOBALAMIN) 1000 MCG tablet Take 1,000 mcg by mouth daily.       No current facility-administered medications on file prior to visit.    Allergies  Allergen Reactions  . Atorvastatin     REACTION: aches and pains  . Enalapril Maleate     REACTION: cough  . Hydrochlorothiazide     REACTION: hair loss  . Propoxyphene-Acetaminophen Hives  . Simvastatin     REACTION: cramps  . Spironolactone  REACTION: cramps    Past Medical History  Diagnosis Date  . Anemia   . Personal history of colon cancer   . Diabetes mellitus   . Hyperlipidemia   . Hypertension   . Low back pain   . Obesity   . COPD (chronic obstructive pulmonary disease)   . Renal insufficiency   . NSTEMI (non-ST elevated myocardial infarction) 06/11/2013  . Ischemic cardiomyopathy 06/11/2013    Past Surgical History  Procedure Laterality Date  . Colectomy      History  Smoking status  . Current Every Day Smoker  . Types: Cigarettes  Smokeless tobacco  . Never Used    History  Alcohol Use No    Family History  Problem Relation Age of Onset  . Hypertension      Reviw of Systems:  Reviewed in the HPI.  All other systems are negative.  Physical Exam: Blood pressure 164/98, pulse 92, height 5\' 2"  (1.575 m), weight 186 lb 12.8 oz (84.732 kg). General: Well developed, well nourished, in no acute distress.  Head: Normocephalic, atraumatic, sclera  non-icteric, mucus membranes are moist,   Neck: Supple. Carotids are 2 + without bruits. No JVD   Lungs: Clear   Heart: RR, normal S1, S2  Abdomen: Soft, non-tender, non-distended with normal bowel sounds.  Msk:  Strength and tone are normal   Extremities: No clubbing or cyanosis. 2+ chronic edema.  She had hose on Distal pedal pulses are 2+ and equal    Neuro: CN II - XII intact.  Alert and oriented X 3.   Psych:  Normal   ECG:   Assessment / Plan:

## 2013-07-11 ENCOUNTER — Telehealth: Payer: Self-pay | Admitting: Cardiovascular Disease

## 2013-07-11 NOTE — Telephone Encounter (Signed)
New message      Pt needs samples of effient.

## 2013-07-13 NOTE — Telephone Encounter (Signed)
F/u    Previous message. Pt need samples because she has ran out and medication cost $500. Please call pt

## 2013-07-19 ENCOUNTER — Telehealth: Payer: Self-pay | Admitting: Cardiovascular Disease

## 2013-07-19 NOTE — Telephone Encounter (Signed)
New message  Patient is completely out of Effient and needs samples. Please call patient and advise.

## 2013-07-19 NOTE — Telephone Encounter (Signed)
Samples provided/ pt informed. Pt states she can not afford effient, she went to 2 different pharms and one was charging her 500.00 and the other was 300.00. Pt was told that I will send a msg to Lovett Sox RN to see if there would be an appropriate channel for her to get her meds more affordable. Pt awaiting return call.

## 2013-07-27 NOTE — Telephone Encounter (Signed)
Have printed the 2 patient assistance programs that will help patients cost of EFFIENT, I have informed her I will mail those to her today.

## 2013-08-01 ENCOUNTER — Ambulatory Visit (INDEPENDENT_AMBULATORY_CARE_PROVIDER_SITE_OTHER): Payer: 59 | Admitting: Internal Medicine

## 2013-08-01 ENCOUNTER — Encounter: Payer: Self-pay | Admitting: Internal Medicine

## 2013-08-01 VITALS — BP 150/88 | HR 90 | Temp 97.2°F | Resp 16 | Ht 62.0 in | Wt 182.0 lb

## 2013-08-01 DIAGNOSIS — F17200 Nicotine dependence, unspecified, uncomplicated: Secondary | ICD-10-CM

## 2013-08-01 DIAGNOSIS — E785 Hyperlipidemia, unspecified: Secondary | ICD-10-CM

## 2013-08-01 DIAGNOSIS — K219 Gastro-esophageal reflux disease without esophagitis: Secondary | ICD-10-CM

## 2013-08-01 DIAGNOSIS — N259 Disorder resulting from impaired renal tubular function, unspecified: Secondary | ICD-10-CM

## 2013-08-01 DIAGNOSIS — Z0000 Encounter for general adult medical examination without abnormal findings: Secondary | ICD-10-CM

## 2013-08-01 DIAGNOSIS — I2510 Atherosclerotic heart disease of native coronary artery without angina pectoris: Secondary | ICD-10-CM

## 2013-08-01 DIAGNOSIS — L299 Pruritus, unspecified: Secondary | ICD-10-CM

## 2013-08-01 DIAGNOSIS — E119 Type 2 diabetes mellitus without complications: Secondary | ICD-10-CM

## 2013-08-01 MED ORDER — TRIAMCINOLONE ACETONIDE 0.5 % EX CREA: 1.0000 "application " | TOPICAL_CREAM | Freq: Three times a day (TID) | CUTANEOUS | Status: DC

## 2013-08-01 MED ORDER — NITROGLYCERIN 0.4 MG SL SUBL: 0.4000 mg | SUBLINGUAL_TABLET | SUBLINGUAL | Status: DC | PRN

## 2013-08-01 NOTE — Assessment & Plan Note (Signed)
Chronic Risks associated with diet and treatment noncompliance were discussed. Compliance was encouraged. Continue with current prescription therapy as reflected on the Med list. Labs

## 2013-08-01 NOTE — Assessment & Plan Note (Signed)
Zantac otc

## 2013-08-01 NOTE — Progress Notes (Signed)
Subjective:  New pt - not seen in>3 years  Chest Pain  This is a new problem. The current episode started today. The onset quality is sudden. The problem has been resolved. The pain is present in the substernal region. The pain is at a severity of 2/10. The pain is mild. The quality of the pain is described as burning and heavy. The pain does not radiate. Pertinent negatives include no back pain, orthopnea, palpitations or shortness of breath. The pain is aggravated by nothing.  Her past medical history is significant for CAD and COPD.  C/o GERD. C/o back itching since 10/14  The patient is here for a wellness exam. The patient has been doing well overall without major physical or psychological issues going on lately. The patient needs to address  chronic hypertension that has been well controlled with medicines; to address chronic  hyperlipidemia controlled with medicines as well; and to address type 2 chronic diabetes, controlled with medical treatment and diet.  She was hospitalized at [facility] on Jun 06, 2013. She has a history of chronic systolic congestive heart failure with an ejection fraction of around 35%. She has a history of coronary artery disease. She is status post PTCA and stenting of her mid left anterior descending artery using a 3.5 x 15 mm Alpine stent. She has a history of COPD. [name] not had a cigarette since he left the hospital. She also has a history of diabetes mellitus and history of colon cancer   Review of Systems  Respiratory: Negative for shortness of breath and wheezing.   Cardiovascular: Positive for chest pain. Negative for palpitations and orthopnea.  Gastrointestinal: Negative for diarrhea.  Genitourinary: Negative for hematuria.  Musculoskeletal: Negative for back pain.  Skin: Negative for rash.  Neurological: Negative for speech difficulty.  Psychiatric/Behavioral: Negative for sleep disturbance and decreased concentration.   SBP 140 at home  BP Readings  from Last 3 Encounters:  08/01/13 150/88  06/20/13 164/98  06/11/13 156/78   Wt Readings from Last 3 Encounters:  08/01/13 182 lb (82.555 kg)  06/20/13 186 lb 12.8 oz (84.732 kg)  06/11/13 184 lb 11.9 oz (83.8 kg)        Objective:   Physical Exam  Constitutional: She appears well-developed. No distress.  Obese   HENT:  Head: Normocephalic.  Right Ear: External ear normal.  Left Ear: External ear normal.  Nose: Nose normal.  Mouth/Throat: Oropharynx is clear and moist.  Eyes: Conjunctivae are normal. Pupils are equal, round, and reactive to light. Right eye exhibits no discharge. Left eye exhibits no discharge.  Neck: Normal range of motion. Neck supple. No JVD present. No tracheal deviation present. No thyromegaly present.  Cardiovascular: Normal rate, regular rhythm and normal heart sounds.   Pulmonary/Chest: No stridor. No respiratory distress. She has no wheezes.  Abdominal: Soft. Bowel sounds are normal. She exhibits no distension and no mass. There is no tenderness. There is no rebound and no guarding.  Musculoskeletal: She exhibits no edema and no tenderness.  Lymphadenopathy:    She has no cervical adenopathy.  Neurological: She displays normal reflexes. No cranial nerve deficit. She exhibits normal muscle tone. Coordination normal.  Skin: No rash noted. No erythema.  Psychiatric: She has a normal mood and affect. Her behavior is normal. Judgment and thought content normal.   Lab Results  Component Value Date   WBC 5.6 06/10/2013   HGB 11.4* 06/10/2013   HCT 35.7* 06/10/2013   PLT 157 06/10/2013  GLUCOSE 152* 06/10/2013   CHOL 154 05/06/2010   TRIG 95 06/07/2013   HDL 45.90 05/06/2010   LDLCALC 97 05/06/2010   ALT 31 06/06/2013   AST 68* 06/06/2013   NA 144 06/10/2013   K 3.5 06/10/2013   CL 108 06/10/2013   CREATININE 1.12* 06/10/2013   BUN 16 06/10/2013   CO2 26 06/10/2013   TSH 4.283 06/07/2013   INR 1.16 06/07/2013   HGBA1C 8.2* 05/06/2010   MICROALBUR 13.9* 10/13/2007           Assessment & Plan:

## 2013-08-01 NOTE — Assessment & Plan Note (Signed)
We discussed age appropriate health related issues, including available/recomended screening tests and vaccinations. We discussed a need for adhering to healthy diet and exercise. Labs/EKG were reviewed/ordered. All questions were answered.  Labs 

## 2013-08-01 NOTE — Assessment & Plan Note (Signed)
Continue with current prescription therapy as reflected on the Med list.  

## 2013-08-01 NOTE — Patient Instructions (Signed)
Gluten free trial (no wheat products) for 4-6 weeks. OK to use gluten-free bread and gluten-free pasta.     

## 2013-08-01 NOTE — Assessment & Plan Note (Signed)
Stopped in 12/14

## 2013-08-01 NOTE — Assessment & Plan Note (Signed)
NTG prn CP. CPepisode was mild; sounds more like it was GERD induced. She will f/u w/Cardiology

## 2013-08-01 NOTE — Progress Notes (Signed)
Pre visit review using our clinic review tool, if applicable. No additional management support is needed unless otherwise documented below in the visit note. 

## 2013-08-01 NOTE — Assessment & Plan Note (Signed)
Labs

## 2013-08-01 NOTE — Assessment & Plan Note (Signed)
10/15 ?etiol Try Triamcinolone Rx

## 2013-08-04 ENCOUNTER — Telehealth: Payer: Self-pay | Admitting: Internal Medicine

## 2013-08-04 ENCOUNTER — Telehealth: Payer: Self-pay

## 2013-08-04 NOTE — Telephone Encounter (Signed)
Relevant patient education mailed to patient.  

## 2013-09-19 ENCOUNTER — Telehealth: Payer: Self-pay | Admitting: Cardiovascular Disease

## 2013-09-19 NOTE — Telephone Encounter (Signed)
New Message:  Pt states she is having chest pain and her fingers are numb. It started 3am... Pt also states it feels like someone has stabbed her in the back.

## 2013-09-19 NOTE — Telephone Encounter (Signed)
SPOKE WITH  PT  RE MESSAGE  PT  AWOKE  THIS  AM  WITH  CHEST  PAIN   THAT  RADIATED TO  BACK  WITH A STABBING  SENSATION  ALSO  HAD  SOME  ARM  NUMBNESS  HAD  TAKEN  ASA  THIS  AM  WITH SOME  RELIEF  PAIN AT THE  TIME  OF  INITIAL  PHONE  CALL WAS    5   ON SCALE  1-10  PT  HAS  APPT TOM  AT  8:30  DISCUSSED  WITH  DR NAHSER   PER  DR NAHSER  PT  NEEDS TO GO  TO  ED IF  SYMPTOMS  ARE  SIMILAR TO   WHEN  HAD  MI   OR  WORSEN  OTHER  WISE   WILL SEE PT   AS  SCHEDULED.SPOKE  WITH PT  AGAIN  PER PT  PAIN  HAS  GONE   CONT  TO C/O  WITH ARM  NUMBNESS ENCOURAGED  PT T O CONT TO MONITOR   IF  S/S RETURN   OR WORSEN   NEED TO GO  TO  ED   PT  VERBALIZED  UNDERSTANDING./CY

## 2013-09-20 ENCOUNTER — Encounter: Payer: Self-pay | Admitting: Cardiovascular Disease

## 2013-09-20 ENCOUNTER — Other Ambulatory Visit: Payer: 59

## 2013-09-20 ENCOUNTER — Ambulatory Visit (INDEPENDENT_AMBULATORY_CARE_PROVIDER_SITE_OTHER): Payer: 59 | Admitting: Cardiovascular Disease

## 2013-09-20 VITALS — BP 160/80 | HR 68 | Ht 62.0 in | Wt 184.4 lb

## 2013-09-20 DIAGNOSIS — I5022 Chronic systolic (congestive) heart failure: Secondary | ICD-10-CM

## 2013-09-20 DIAGNOSIS — R079 Chest pain, unspecified: Secondary | ICD-10-CM

## 2013-09-20 DIAGNOSIS — I169 Hypertensive crisis, unspecified: Secondary | ICD-10-CM

## 2013-09-20 DIAGNOSIS — I10 Essential (primary) hypertension: Secondary | ICD-10-CM

## 2013-09-20 DIAGNOSIS — I2510 Atherosclerotic heart disease of native coronary artery without angina pectoris: Secondary | ICD-10-CM

## 2013-09-20 DIAGNOSIS — E785 Hyperlipidemia, unspecified: Secondary | ICD-10-CM

## 2013-09-20 DIAGNOSIS — I509 Heart failure, unspecified: Secondary | ICD-10-CM

## 2013-09-20 LAB — BASIC METABOLIC PANEL
BUN: 13 mg/dL (ref 6–23)
CO2: 25 mEq/L (ref 19–32)
Calcium: 9.4 mg/dL (ref 8.4–10.5)
Chloride: 104 mEq/L (ref 96–112)
Creatinine, Ser: 0.9 mg/dL (ref 0.4–1.2)
GFR: 69.91 mL/min (ref >60.00)
Glucose, Bld: 297 mg/dL — ABNORMAL HIGH (ref 70–99)
Potassium: 3.6 mEq/L (ref 3.5–5.1)
Sodium: 136 mEq/L (ref 135–145)

## 2013-09-20 LAB — TROPONIN I: Troponin I: <0.3 ng/mL (ref <0.30)

## 2013-09-20 LAB — BRAIN NATRIURETIC PEPTIDE: Pro B Natriuretic peptide (BNP): 25 pg/mL (ref 0.0–100.0)

## 2013-09-20 MED ORDER — FUROSEMIDE 80 MG PO TABS: 40.0000 mg | ORAL_TABLET | Freq: Every day | ORAL | Status: DC

## 2013-09-20 MED ORDER — POTASSIUM CHLORIDE CRYS ER 20 MEQ PO TBCR: 20.0000 meq | EXTENDED_RELEASE_TABLET | Freq: Every day | ORAL | Status: DC

## 2013-09-20 NOTE — Assessment & Plan Note (Signed)
She ran out of her Lasix this past Sunday. We'll restart her Lasix. We'll also at potassium chloride 20 mEq a day. We'll check a basic metabolic profile in 3 weeks.

## 2013-09-20 NOTE — Assessment & Plan Note (Signed)
[name] presents with some episodes of chest discomfort that started yesterday. She has a history of PCI in December. She has not taken any Lasix for the past 3 days and this episode of chest pain may have been due to congestive heart failure. She has been exercising without any episodes of chest pain sensory plasty in December.  We will check a troponin level today as well as a basic metabolic profile and B. natruretic peptide. If the troponin is elevated then we'll need to proceed with cardiac catheterization. Otherwise we will assume that the discomfort yesterday was do to heart failure and hypertension.  She has done well and has not had any episodes of pain with exertion up until yesterday. Yesterday's pain occurred at rest and has not recurred today he can after walking up to the office from the parking lot.   I have asked her to  call me right away if she has any further episodes of chest pain.   I would  have a low threshold to do   a cardiac catheterization if she has recurrent discomfort.  Will see her in 1-2 month.s

## 2013-09-20 NOTE — Progress Notes (Signed)
[name] Date of Birth  [birth year]       [facility]    [employer] [address], [address] [name], [name], [name]  [address]   [name], [name]  [phone]     [phone]   Fax  [phone]    Fax [phone]  Problem List: 1. Coronary artery disease-status post PTCA and stenting of her mid LAD using a 3.5 x 15 mm Alpine stent ( DES) ( Dec. 5, 2015)   2. chronic systolic congestive heart failure-ejection fraction of 35% 2. Hypertension 3. COPD 4. Diabetes mellitus 5. History colon cancer   History of Present Illness:  [name] is seen today. She was initially seen by [name] in the hospital. She has a history of chronic systolic congestive heart failure with an ejection fraction of around 35%. She has a history of coronary artery disease. She is status post PTCA and stenting of her mid left anterior descending artery using a 3.5 x 15 mm Alpine stent.   She has a history of COPD. [name] not had a cigarette since he left the hospital. She also has a history of diabetes mellitus and history of colon cancer  She has mild interscapular pain this morning .  And the pain resolved after she drank some club soda.  Did not feel like her MI pain.   She has been walking every morining at the mall and at [name].   Her BP is up today.   - she may have had lots of salt last night.   The BP has remained high   September 20, 2013  [name] has been having some episodes of chest discomfort. She initially thought it might be due to indigestion or due to something that she ate.  It radiated to her interscapular region. It was associated with some hand and normal tingling. The pain was intermittent but lasted for several hours.   She took a sublingual nitroglycerin and the pain resolved very quickly and she did not have any further episodes the whole rest of the day.  She informed that she ran out of her Lasix 3 days  ago.     Current Outpatient Prescriptions on File Prior to Visit  Medication Sig Dispense Refill  . amLODipine (NORVASC) 10 MG tablet Take 1 tablet (10 mg total) by mouth daily.  30 tablet  6  . aspirin 81 MG chewable tablet Chew 1 tablet (81 mg total) by mouth daily.      [name] CALCIUM PO Take 1 tablet by mouth daily.      . carvedilol (COREG) 25 MG tablet Take 1 tablet (25 mg total) by mouth 2 (two) times daily with a meal.  60 tablet  6  . furosemide (LASIX) 80 MG tablet Take 40 mg by mouth daily.        [name] glimepiride (AMARYL) 4 MG tablet Take 1 tablet (4 mg total) by mouth 2 (two) times daily with a meal.  30 tablet  6  . irbesartan (AVAPRO) 300 MG tablet Take 1 tablet (300 mg total) by mouth daily.  30 tablet  6  . nitroGLYCERIN (NITROSTAT) 0.4 MG SL tablet Place 1 tablet (0.4 mg total) under the tongue every 5 (five) minutes as needed for chest pain.  20 tablet  3  . prasugrel (EFFIENT) 10 MG TABS tablet Take 1 tablet (10 mg total) by mouth daily.  30 tablet  6  .  rosuvastatin (CRESTOR) 5 MG tablet Take 1 tablet (5 mg total) by mouth every 3 (three) days.  10 tablet  6  . triamcinolone cream (KENALOG) 0.5 % Apply 1 application topically 3 (three) times daily.  90 g  1  . vitamin B-12 (CYANOCOBALAMIN) 1000 MCG tablet Take 1,000 mcg by mouth daily.       No current facility-administered medications on file prior to visit.    Allergies  Allergen Reactions  . Atorvastatin     REACTION: aches and pains  . Enalapril Maleate     REACTION: cough  . Hydrochlorothiazide     REACTION: hair loss  . Propoxyphene N-Acetaminophen Hives  . Simvastatin     REACTION: cramps  . Spironolactone     REACTION: cramps    Past Medical History  Diagnosis Date  . Anemia   . Personal history of colon cancer   . Diabetes mellitus   . Hyperlipidemia   . Hypertension   . Low back pain   . Obesity   . COPD (chronic obstructive pulmonary disease)   . Renal insufficiency   . NSTEMI (non-ST elevated  myocardial infarction) 06/11/2013  . Ischemic cardiomyopathy 06/11/2013    Past Surgical History  Procedure Laterality Date  . Colectomy      History  Smoking status  . Current Every Day Smoker  . Types: Cigarettes  Smokeless tobacco  . Never Used    History  Alcohol Use No    Family History  Problem Relation Age of Onset  . Hypertension      Reviw of Systems:  Reviewed in the HPI.  All other systems are negative.  Physical Exam: Blood pressure 160/80, pulse 68, height 5\' 2"  (1.575 m), weight 184 lb 6.4 oz (83.643 kg). General: Well developed, well nourished, in no acute distress.  Head: Normocephalic, atraumatic, sclera non-icteric, mucus membranes are moist,   Neck: Supple. Carotids are 2 + without bruits. No JVD   Lungs: Clear   Heart: RR, normal S1, S2  Abdomen: Soft, non-tender, non-distended with normal bowel sounds.  Msk:  Strength and tone are normal   Extremities: No clubbing or cyanosis. 2+ chronic edema.  She had hose on Distal pedal pulses are 2+ and equal    Neuro: CN II - XII intact.  Alert and oriented X 3.   Psych:  Normal   ECG: September 20, 2013:  NSR at 23.  No ST or T wave changes  Assessment / Plan:

## 2013-09-20 NOTE — Patient Instructions (Signed)
Your physician recommends that you return for lab work in: today   Your physician recommends that you return for lab work in: 3 weeks bmet//non fasting  Your physician has recommended you make the following change in your medication:  START POTASSIUM CHLORIDE 20 [place] RESTART LASIX// SCRIPT SENT TO [facility]  Your physician recommends that you schedule a follow-up appointment in:1-2 MONTHS

## 2013-10-03 ENCOUNTER — Ambulatory Visit (INDEPENDENT_AMBULATORY_CARE_PROVIDER_SITE_OTHER): Payer: 59 | Admitting: *Deleted

## 2013-10-03 ENCOUNTER — Ambulatory Visit (INDEPENDENT_AMBULATORY_CARE_PROVIDER_SITE_OTHER): Payer: 59 | Admitting: Internal Medicine

## 2013-10-03 ENCOUNTER — Encounter: Payer: Self-pay | Admitting: Internal Medicine

## 2013-10-03 ENCOUNTER — Telehealth: Payer: Self-pay | Admitting: Internal Medicine

## 2013-10-03 VITALS — BP 140/70 | HR 80 | Temp 98.2°F | Resp 16 | Wt 179.0 lb

## 2013-10-03 DIAGNOSIS — N259 Disorder resulting from impaired renal tubular function, unspecified: Secondary | ICD-10-CM

## 2013-10-03 DIAGNOSIS — F17200 Nicotine dependence, unspecified, uncomplicated: Secondary | ICD-10-CM

## 2013-10-03 DIAGNOSIS — M545 Low back pain, unspecified: Secondary | ICD-10-CM

## 2013-10-03 DIAGNOSIS — R634 Abnormal weight loss: Secondary | ICD-10-CM

## 2013-10-03 DIAGNOSIS — E119 Type 2 diabetes mellitus without complications: Secondary | ICD-10-CM

## 2013-10-03 DIAGNOSIS — I2510 Atherosclerotic heart disease of native coronary artery without angina pectoris: Secondary | ICD-10-CM

## 2013-10-03 DIAGNOSIS — E785 Hyperlipidemia, unspecified: Secondary | ICD-10-CM

## 2013-10-03 DIAGNOSIS — K219 Gastro-esophageal reflux disease without esophagitis: Secondary | ICD-10-CM

## 2013-10-03 DIAGNOSIS — Z0000 Encounter for general adult medical examination without abnormal findings: Secondary | ICD-10-CM

## 2013-10-03 DIAGNOSIS — L299 Pruritus, unspecified: Secondary | ICD-10-CM

## 2013-10-03 DIAGNOSIS — D509 Iron deficiency anemia, unspecified: Secondary | ICD-10-CM

## 2013-10-03 LAB — BASIC METABOLIC PANEL
BUN: 26 mg/dL — ABNORMAL HIGH (ref 6–23)
CO2: 27 mEq/L (ref 19–32)
Calcium: 10 mg/dL (ref 8.4–10.5)
Chloride: 98 mEq/L (ref 96–112)
Creatinine, Ser: 1.2 mg/dL (ref 0.4–1.2)
GFR: 47.32 mL/min — ABNORMAL LOW (ref >60.00)
Glucose, Bld: 274 mg/dL — ABNORMAL HIGH (ref 70–99)
Potassium: 4.8 mEq/L (ref 3.5–5.1)
Sodium: 134 mEq/L — ABNORMAL LOW (ref 135–145)

## 2013-10-03 LAB — LIPID PANEL
Cholesterol: 162 mg/dL (ref 0–200)
HDL: 47.6 mg/dL (ref >39.00)
LDL Cholesterol: 95 mg/dL (ref 0–99)
Total CHOL/HDL Ratio: 3
Triglycerides: 99 mg/dL (ref 0.0–149.0)
VLDL: 19.8 mg/dL (ref 0.0–40.0)

## 2013-10-03 LAB — CBC WITH DIFFERENTIAL/PLATELET
Basophils Absolute: 0 10*3/uL (ref 0.0–0.1)
Basophils Relative: 0.6 % (ref 0.0–3.0)
Eosinophils Absolute: 0.1 10*3/uL (ref 0.0–0.7)
Eosinophils Relative: 1.9 % (ref 0.0–5.0)
HCT: 37 % (ref 36.0–46.0)
Hemoglobin: 12.5 g/dL (ref 12.0–15.0)
Lymphocytes Relative: 29.7 % (ref 12.0–46.0)
Lymphs Abs: 1.5 10*3/uL (ref 0.7–4.0)
MCHC: 33.8 g/dL (ref 30.0–36.0)
MCV: 87.5 fl (ref 78.0–100.0)
Monocytes Absolute: 0.4 10*3/uL (ref 0.1–1.0)
Monocytes Relative: 8.3 % (ref 3.0–12.0)
Neutro Abs: 3 10*3/uL (ref 1.4–7.7)
Neutrophils Relative %: 59.5 % (ref 43.0–77.0)
Platelets: 142 10*3/uL — ABNORMAL LOW (ref 150.0–400.0)
RBC: 4.23 Mil/uL (ref 3.87–5.11)
RDW: 14.2 % (ref 11.5–14.6)
WBC: 5.1 10*3/uL (ref 4.5–10.5)

## 2013-10-03 LAB — HEPATIC FUNCTION PANEL
ALT: 18 U/L (ref 0–35)
AST: 13 U/L (ref 0–37)
Albumin: 4 g/dL (ref 3.5–5.2)
Alkaline Phosphatase: 110 U/L (ref 39–117)
Bilirubin, Direct: 0.1 mg/dL (ref 0.0–0.3)
Total Bilirubin: 0.5 mg/dL (ref 0.3–1.2)
Total Protein: 7.6 g/dL (ref 6.0–8.3)

## 2013-10-03 LAB — URINALYSIS, ROUTINE W REFLEX MICROSCOPIC
Bilirubin Urine: NEGATIVE
Ketones, ur: NEGATIVE
Leukocytes, UA: NEGATIVE
Nitrite: NEGATIVE
Specific Gravity, Urine: 1.01 (ref 1.000–1.030)
Total Protein, Urine: 30 — AB
Urine Glucose: NEGATIVE
Urobilinogen, UA: 0.2 (ref 0.0–1.0)
pH: 6 (ref 5.0–8.0)

## 2013-10-03 LAB — TSH: TSH: 2.19 u[IU]/mL (ref 0.35–5.50)

## 2013-10-03 LAB — HEMOGLOBIN A1C: Hgb A1c MFr Bld: 11.8 % — ABNORMAL HIGH (ref 4.6–6.5)

## 2013-10-03 MED ORDER — OMEPRAZOLE 40 MG PO CPDR: 40.0000 mg | DELAYED_RELEASE_CAPSULE | Freq: Every day | ORAL | Status: DC

## 2013-10-03 MED ORDER — METFORMIN HCL 500 MG PO TABS: 500.0000 mg | ORAL_TABLET | Freq: Two times a day (BID) | ORAL | Status: DC

## 2013-10-03 NOTE — Assessment & Plan Note (Signed)
Continue with current prescription therapy as reflected on the Med list.  

## 2013-10-03 NOTE — Telephone Encounter (Signed)
Relevant patient education assigned to patient using Emmi. ° °

## 2013-10-03 NOTE — Assessment & Plan Note (Addendum)
Added Metformin 500 mg bid Labs

## 2013-10-03 NOTE — Assessment & Plan Note (Signed)
Worse Omeprazole qam Rx

## 2013-10-03 NOTE — Assessment & Plan Note (Signed)
Labs

## 2013-10-03 NOTE — Assessment & Plan Note (Signed)
Wt Readings from Last 3 Encounters:  10/03/13 179 lb (81.194 kg)  09/20/13 184 lb 6.4 oz (83.643 kg)  08/01/13 182 lb (82.555 kg)

## 2013-10-03 NOTE — Progress Notes (Signed)
Pre visit review using our clinic review tool, if applicable. No additional management support is needed unless otherwise documented below in the visit note. 

## 2013-10-03 NOTE — Progress Notes (Signed)
Subjective:   C/o bad GERD x 2-3 wks, can't sleep  Chest Pain  This is a recurrent problem. The current episode started today. The onset quality is sudden. The problem has been gradually improving. The pain is present in the substernal region. The pain is at a severity of 2/10. The pain is mild. The quality of the pain is described as burning and heavy. The pain does not radiate. Pertinent negatives include no back pain, orthopnea, palpitations or shortness of breath. The pain is aggravated by nothing.  Her past medical history is significant for CAD and COPD. Prior workup: she saw Dr Acie Fredrickson 3/15.   C/o back itching since 10/14   The patient needs to address  chronic hypertension that has been well controlled with medicines; to address chronic  hyperlipidemia controlled with medicines as well; and to address type 2 chronic diabetes, controlled with medical treatment and diet.  She was hospitalized at [facility] on Jun 06, 2013. She has a history of chronic systolic congestive heart failure with an ejection fraction of around 35%. She has a history of coronary artery disease. She is status post PTCA and stenting of her mid left anterior descending artery using a 3.5 x 15 mm Alpine stent. She has a history of COPD. [name] not had a cigarette since he left the hospital. She also has a history of diabetes mellitus and history of colon cancer   Review of Systems  Respiratory: Negative for shortness of breath and wheezing.   Cardiovascular: Positive for chest pain. Negative for palpitations and orthopnea.  Gastrointestinal: Negative for diarrhea.  Genitourinary: Negative for hematuria.  Musculoskeletal: Negative for back pain.  Skin: Negative for rash.  Neurological: Negative for speech difficulty.  Psychiatric/Behavioral: Negative for sleep disturbance and decreased concentration.   SBP 140 at home  BP Readings from Last 3 Encounters:  10/03/13 140/70  09/20/13 160/80  08/01/13 150/88   Wt  Readings from Last 3 Encounters:  10/03/13 179 lb (81.194 kg)  09/20/13 184 lb 6.4 oz (83.643 kg)  08/01/13 182 lb (82.555 kg)        Objective:   Physical Exam  Constitutional: She appears well-developed. No distress.  Obese   HENT:  Head: Normocephalic.  Right Ear: External ear normal.  Left Ear: External ear normal.  Nose: Nose normal.  Mouth/Throat: Oropharynx is clear and moist.  Eyes: Conjunctivae are normal. Pupils are equal, round, and reactive to light. Right eye exhibits no discharge. Left eye exhibits no discharge.  Neck: Normal range of motion. Neck supple. No JVD present. No tracheal deviation present. No thyromegaly present.  Cardiovascular: Normal rate, regular rhythm and normal heart sounds.   Pulmonary/Chest: No stridor. No respiratory distress. She has no wheezes.  Abdominal: Soft. Bowel sounds are normal. She exhibits no distension and no mass. There is no tenderness. There is no rebound and no guarding.  Musculoskeletal: She exhibits no edema and no tenderness.  Lymphadenopathy:    She has no cervical adenopathy.  Neurological: She displays normal reflexes. No cranial nerve deficit. She exhibits normal muscle tone. Coordination normal.  Skin: No rash noted. No erythema.  Psychiatric: She has a normal mood and affect. Her behavior is normal. Judgment and thought content normal.   Lab Results  Component Value Date   WBC 5.6 06/10/2013   HGB 11.4* 06/10/2013   HCT 35.7* 06/10/2013   PLT 157 06/10/2013   GLUCOSE 297* 09/20/2013   CHOL 154 05/06/2010   TRIG 95 06/07/2013   HDL  45.90 05/06/2010   LDLCALC 97 05/06/2010   ALT 31 06/06/2013   AST 68* 06/06/2013   NA 136 09/20/2013   K 3.6 09/20/2013   CL 104 09/20/2013   CREATININE 0.9 09/20/2013   BUN 13 09/20/2013   CO2 25 09/20/2013   TSH 4.283 06/07/2013   INR 1.16 06/07/2013   HGBA1C 8.2* 05/06/2010   MICROALBUR 13.9* 10/13/2007          Assessment & Plan:

## 2013-10-11 ENCOUNTER — Other Ambulatory Visit (INDEPENDENT_AMBULATORY_CARE_PROVIDER_SITE_OTHER): Payer: 59

## 2013-10-11 DIAGNOSIS — E785 Hyperlipidemia, unspecified: Secondary | ICD-10-CM

## 2013-10-11 DIAGNOSIS — I2510 Atherosclerotic heart disease of native coronary artery without angina pectoris: Secondary | ICD-10-CM

## 2013-10-11 LAB — BASIC METABOLIC PANEL
BUN: 27 mg/dL — ABNORMAL HIGH (ref 6–23)
CO2: 25 mEq/L (ref 19–32)
Calcium: 9.7 mg/dL (ref 8.4–10.5)
Chloride: 101 mEq/L (ref 96–112)
Creatinine, Ser: 1.2 mg/dL (ref 0.4–1.2)
GFR: 48.69 mL/min — ABNORMAL LOW (ref >60.00)
Glucose, Bld: 254 mg/dL — ABNORMAL HIGH (ref 70–99)
Potassium: 4.3 mEq/L (ref 3.5–5.1)
Sodium: 133 mEq/L — ABNORMAL LOW (ref 135–145)

## 2013-10-18 NOTE — Telephone Encounter (Signed)
Please close this encounter

## 2013-11-01 ENCOUNTER — Encounter (INDEPENDENT_AMBULATORY_CARE_PROVIDER_SITE_OTHER): Payer: Self-pay

## 2013-11-01 ENCOUNTER — Encounter: Payer: Self-pay | Admitting: Cardiovascular Disease

## 2013-11-01 ENCOUNTER — Ambulatory Visit (INDEPENDENT_AMBULATORY_CARE_PROVIDER_SITE_OTHER): Payer: 59 | Admitting: Cardiovascular Disease

## 2013-11-01 VITALS — BP 138/88 | HR 80 | Ht 62.0 in | Wt 181.8 lb

## 2013-11-01 DIAGNOSIS — I5022 Chronic systolic (congestive) heart failure: Secondary | ICD-10-CM

## 2013-11-01 DIAGNOSIS — I509 Heart failure, unspecified: Secondary | ICD-10-CM

## 2013-11-01 DIAGNOSIS — I2510 Atherosclerotic heart disease of native coronary artery without angina pectoris: Secondary | ICD-10-CM

## 2013-11-01 DIAGNOSIS — E785 Hyperlipidemia, unspecified: Secondary | ICD-10-CM

## 2013-11-01 MED ORDER — ROSUVASTATIN CALCIUM 5 MG PO TABS: 5.0000 mg | ORAL_TABLET | Freq: Every day | ORAL | Status: DC

## 2013-11-01 NOTE — Assessment & Plan Note (Addendum)
She is doing ok.  No further episodes of cp.  Encouraged her to exercise.    For her LDL is still a little elevated. Will try to increase her Crestor to 5 mg daily. She now takes the third day. She does not recall why she took it every third day but perhaps it was due to muscle aches and pains. [name] not having any muscle cramps. I'll see her again in 3 months for followup office visit and lipids, liver enzymes, and basic metabolic profile.

## 2013-11-01 NOTE — Assessment & Plan Note (Signed)
She is doing well.  Taking her meds.

## 2013-11-01 NOTE — Patient Instructions (Signed)
Your physician has recommended you make the following change in your medication:  INCREASE Crestor to 5 mg once daily  Your physician recommends that you schedule a follow-up appointment in: 3 months - July 29 - with Dr. Acie Fredrickson with fasting lab work  Your physician recommends that you return for lab work on:  July 29  - you will need to fast for this appointment (nothing to eat or drink after midnight except water)

## 2013-11-01 NOTE — Progress Notes (Signed)
[name] Date of Birth  1951-02-27       [facility]    [employer] [address], [address] [place], [place], [place]  [address]   [place], [place]  [address] [phone]     [phone]   Fax  [phone]    Fax [phone]  Problem List: 1. Coronary artery disease-status post PTCA and stenting of her mid LAD using a 3.5 x 15 mm Alpine stent ( DES) ( Dec. 5, 2015)   2. chronic systolic congestive heart failure-ejection fraction of 35% 2. Hypertension 3. COPD 4. Diabetes mellitus 5. History colon cancer  History of Present Illness:  [name] is seen today. She was initially seen by Dr. Mare Ferrari in the hospital. She has a history of chronic systolic congestive heart failure with an ejection fraction of around 35%. She has a history of coronary artery disease. She is status post PTCA and stenting of her mid left anterior descending artery using a 3.5 x 15 mm Alpine stent.   She has a history of COPD. [name] not had a cigarette since he left the hospital. She also has a history of diabetes mellitus and history of colon cancer  She has mild interscapular pain this morning .  And the pain resolved after she drank some club soda.  Did not feel like her MI pain.   She has been walking every morining at the mall and at [place].   Her BP is up today.   - she may have had lots of salt last night.   The BP has remained high   September 20, 2013  [name] has been having some episodes of chest discomfort. She initially thought it might be due to indigestion or due to something that she ate.  It radiated to her interscapular region. It was associated with some hand and normal tingling. The pain was intermittent but lasted for several hours.   She took a sublingual nitroglycerin and the pain resolved very quickly and she did not have any further episodes the whole rest of the day.  She informed that she ran out of her Lasix 3 days ago.  November 01, 2013:  [name] is feeling well.  She stopped smoking the day of her cath.  She is eating more "penny candy" to replace the habit.    Current Outpatient Prescriptions on File Prior to Visit  Medication Sig Dispense Refill  . amLODipine (NORVASC) 10 MG tablet Take 1 tablet (10 mg total) by mouth daily.  30 tablet  6  . aspirin 81 MG chewable tablet Chew 1 tablet (81 mg total) by mouth daily.      . carvedilol (COREG) 25 MG tablet Take 1 tablet (25 mg total) by mouth 2 (two) times daily with a meal.  60 tablet  6  . furosemide (LASIX) 80 MG tablet Take 0.5 tablets (40 mg total) by mouth daily.  45 tablet  3  . glimepiride (AMARYL) 4 MG tablet Take 1 tablet (4 mg total) by mouth 2 (two) times daily with a meal.  30 tablet  6  . irbesartan (AVAPRO) 300 MG tablet Take 1 tablet (300 mg total) by mouth daily.  30 tablet  6  . nitroGLYCERIN (NITROSTAT) 0.4 MG SL tablet Place 1 tablet (0.4 mg total) under the tongue every 5 (five) minutes as needed for chest pain.  20 tablet  3  . omeprazole (PRILOSEC) 40 MG capsule Take  1 capsule (40 mg total) by mouth daily.  30 capsule  11  . potassium chloride SA (K-DUR,KLOR-CON) 20 MEQ tablet Take 1 tablet (20 mEq total) by mouth daily.  30 tablet  11  . prasugrel (EFFIENT) 10 MG TABS tablet Take 1 tablet (10 mg total) by mouth daily.  30 tablet  6  . rosuvastatin (CRESTOR) 5 MG tablet Take 1 tablet (5 mg total) by mouth every 3 (three) days.  10 tablet  6  . triamcinolone cream (KENALOG) 0.5 % Apply 1 application topically 3 (three) times daily.  90 g  1  . vitamin B-12 (CYANOCOBALAMIN) 1000 MCG tablet Take 1,000 mcg by mouth daily.       No current facility-administered medications on file prior to visit.    Allergies  Allergen Reactions  . Atorvastatin     REACTION: aches and pains  . Enalapril Maleate     REACTION: cough  . Hydrochlorothiazide     REACTION: hair loss  . Propoxyphene N-Acetaminophen Hives  . Simvastatin     REACTION: cramps  .  Spironolactone     REACTION: cramps    Past Medical History  Diagnosis Date  . Anemia   . Personal history of colon cancer   . Diabetes mellitus   . Hyperlipidemia   . Hypertension   . Low back pain   . Obesity   . COPD (chronic obstructive pulmonary disease)   . Renal insufficiency   . NSTEMI (non-ST elevated myocardial infarction) 06/11/2013  . Ischemic cardiomyopathy 06/11/2013    Past Surgical History  Procedure Laterality Date  . Colectomy      History  Smoking status  . Current Every Day Smoker  . Types: Cigarettes  Smokeless tobacco  . Never Used    History  Alcohol Use No    Family History  Problem Relation Age of Onset  . Hypertension      Reviw of Systems:  Reviewed in the HPI.  All other systems are negative.  Physical Exam: Blood pressure 138/88, pulse 80, height 5\' 2"  (1.575 m), weight 181 lb 12.8 oz (82.464 kg). General: Well developed, well nourished, in no acute distress.  Head: Normocephalic, atraumatic, sclera non-icteric, mucus membranes are moist,   Neck: Supple. Carotids are 2 + without bruits. No JVD   Lungs: Clear   Heart: RR, normal S1, S2  Abdomen: Soft, non-tender, non-distended with normal bowel sounds.  Msk:  Strength and tone are normal   Extremities: No clubbing or cyanosis. 2+ chronic edema.  She had hose on Distal pedal pulses are 2+ and equal    Neuro: CN II - XII intact.  Alert and oriented X 3.   Psych:  Normal   ECG: September 20, 2013:  NSR at 79.  No ST or T wave changes  Assessment / Plan:

## 2013-11-13 ENCOUNTER — Ambulatory Visit (INDEPENDENT_AMBULATORY_CARE_PROVIDER_SITE_OTHER): Payer: 59 | Admitting: Internal Medicine

## 2013-11-13 ENCOUNTER — Encounter: Payer: Self-pay | Admitting: Internal Medicine

## 2013-11-13 VITALS — BP 120/64 | HR 76 | Temp 97.3°F | Resp 16 | Wt 180.0 lb

## 2013-11-13 DIAGNOSIS — M545 Low back pain, unspecified: Secondary | ICD-10-CM

## 2013-11-13 DIAGNOSIS — R42 Dizziness and giddiness: Secondary | ICD-10-CM

## 2013-11-13 DIAGNOSIS — E119 Type 2 diabetes mellitus without complications: Secondary | ICD-10-CM

## 2013-11-13 MED ORDER — MECLIZINE HCL 12.5 MG PO TABS: 12.5000 mg | ORAL_TABLET | Freq: Three times a day (TID) | ORAL | Status: DC | PRN

## 2013-11-13 MED ORDER — DAPAGLIFLOZIN PROPANEDIOL 5 MG PO TABS: ORAL_TABLET | ORAL | Status: DC

## 2013-11-13 MED ORDER — DAPAGLIFLOZIN PROPANEDIOL 10 MG PO TABS: ORAL_TABLET | ORAL | Status: DC

## 2013-11-13 NOTE — Assessment & Plan Note (Signed)
Tylenol prn 

## 2013-11-13 NOTE — Assessment & Plan Note (Signed)
D/c metformin °

## 2013-11-13 NOTE — Patient Instructions (Signed)
Stop Metformin Start  Farxiga 1 a dy in am

## 2013-11-13 NOTE — Progress Notes (Signed)
   Subjective:   C/o bad dizziness x 3 wk - started Metformin 3 wks ago. Also - c/o bad diarrhea x 3 weeks  HPI  The patient needs to address  chronic hypertension that has been well controlled with medicines; to address chronic  hyperlipidemia controlled with medicines as well; and to address type 2 chronic diabetes, controlled with medical treatment and diet.  She was hospitalized at [facility] on Jun 06, 2013. She has a history of chronic systolic congestive heart failure with an ejection fraction of around 35%. She has a history of coronary artery disease. She is status post PTCA and stenting of her mid left anterior descending artery using a 3.5 x 15 mm Alpine stent. She has a history of COPD. [name] not had a cigarette since he left the hospital. She also has a history of diabetes mellitus and history of colon cancer   Review of Systems  Respiratory: Negative for wheezing.   Gastrointestinal: Negative for diarrhea.  Genitourinary: Negative for hematuria.  Skin: Negative for rash.  Neurological: Negative for speech difficulty.  Psychiatric/Behavioral: Negative for sleep disturbance and decreased concentration.   SBP 140 at home  BP Readings from Last 3 Encounters:  11/13/13 120/64  11/01/13 138/88  10/03/13 140/70   Wt Readings from Last 3 Encounters:  11/13/13 180 lb (81.647 kg)  11/01/13 181 lb 12.8 oz (82.464 kg)  10/03/13 179 lb (81.194 kg)        Objective:   Physical Exam  Constitutional: She appears well-developed. No distress.  Obese   HENT:  Head: Normocephalic.  Right Ear: External ear normal.  Left Ear: External ear normal.  Nose: Nose normal.  Mouth/Throat: Oropharynx is clear and moist.  Eyes: Conjunctivae are normal. Pupils are equal, round, and reactive to light. Right eye exhibits no discharge. Left eye exhibits no discharge.  Neck: Normal range of motion. Neck supple. No JVD present. No tracheal deviation present. No thyromegaly present.  Cardiovascular:  Normal rate, regular rhythm and normal heart sounds.   Pulmonary/Chest: No stridor. No respiratory distress. She has no wheezes.  Abdominal: Soft. Bowel sounds are normal. She exhibits no distension and no mass. There is no tenderness. There is no rebound and no guarding.  Musculoskeletal: She exhibits no edema and no tenderness.  Lymphadenopathy:    She has no cervical adenopathy.  Neurological: She displays normal reflexes. No cranial nerve deficit. She exhibits normal muscle tone. Coordination normal.  Skin: No rash noted. No erythema.  Psychiatric: She has a normal mood and affect. Her behavior is normal. Judgment and thought content normal.   H-P (-) B  Lab Results  Component Value Date   WBC 5.1 10/03/2013   HGB 12.5 10/03/2013   HCT 37.0 10/03/2013   PLT 142.0* 10/03/2013   GLUCOSE 254* 10/11/2013   CHOL 162 10/03/2013   TRIG 99.0 10/03/2013   HDL 47.60 10/03/2013   LDLCALC 95 10/03/2013   ALT 18 10/03/2013   AST 13 10/03/2013   NA 133* 10/11/2013   K 4.3 10/11/2013   CL 101 10/11/2013   CREATININE 1.2 10/11/2013   BUN 27* 10/11/2013   CO2 25 10/11/2013   TSH 2.19 10/03/2013   INR 1.16 06/07/2013   HGBA1C 11.8* 10/03/2013   MICROALBUR 13.9* 10/13/2007          Assessment & Plan:

## 2013-11-13 NOTE — Progress Notes (Signed)
Pre visit review using our clinic review tool, if applicable. No additional management support is needed unless otherwise documented below in the visit note. 

## 2013-11-13 NOTE — Assessment & Plan Note (Signed)
D/c Metformin Start [place]

## 2013-11-28 ENCOUNTER — Other Ambulatory Visit: Payer: Self-pay | Admitting: *Deleted

## 2013-11-28 MED ORDER — GLIMEPIRIDE 4 MG PO TABS: 4.0000 mg | ORAL_TABLET | Freq: Two times a day (BID) | ORAL | Status: DC

## 2013-12-11 ENCOUNTER — Encounter: Payer: Self-pay | Admitting: Internal Medicine

## 2013-12-11 ENCOUNTER — Ambulatory Visit (INDEPENDENT_AMBULATORY_CARE_PROVIDER_SITE_OTHER): Payer: 59 | Admitting: Internal Medicine

## 2013-12-11 VITALS — BP 106/60 | HR 68 | Temp 98.2°F | Resp 16 | Wt 178.0 lb

## 2013-12-11 DIAGNOSIS — I5022 Chronic systolic (congestive) heart failure: Secondary | ICD-10-CM

## 2013-12-11 DIAGNOSIS — E119 Type 2 diabetes mellitus without complications: Secondary | ICD-10-CM

## 2013-12-11 DIAGNOSIS — R634 Abnormal weight loss: Secondary | ICD-10-CM

## 2013-12-11 DIAGNOSIS — M545 Low back pain, unspecified: Secondary | ICD-10-CM

## 2013-12-11 DIAGNOSIS — F17200 Nicotine dependence, unspecified, uncomplicated: Secondary | ICD-10-CM

## 2013-12-11 DIAGNOSIS — R42 Dizziness and giddiness: Secondary | ICD-10-CM

## 2013-12-11 DIAGNOSIS — I509 Heart failure, unspecified: Secondary | ICD-10-CM

## 2013-12-11 DIAGNOSIS — I2510 Atherosclerotic heart disease of native coronary artery without angina pectoris: Secondary | ICD-10-CM

## 2013-12-11 DIAGNOSIS — E785 Hyperlipidemia, unspecified: Secondary | ICD-10-CM

## 2013-12-11 MED ORDER — AMLODIPINE BESYLATE 10 MG PO TABS: 10.0000 mg | ORAL_TABLET | Freq: Every day | ORAL | Status: DC

## 2013-12-11 MED ORDER — IRBESARTAN 300 MG PO TABS: 300.0000 mg | ORAL_TABLET | Freq: Every day | ORAL | Status: DC

## 2013-12-11 MED ORDER — FLUCONAZOLE 150 MG PO TABS: 150.0000 mg | ORAL_TABLET | Freq: Once | ORAL | Status: DC

## 2013-12-11 MED ORDER — CARVEDILOL 25 MG PO TABS: 25.0000 mg | ORAL_TABLET | Freq: Two times a day (BID) | ORAL | Status: DC

## 2013-12-11 NOTE — Assessment & Plan Note (Signed)
6/15 poss side effects w/Coreg per pt - will titrate Coreg down. Low BP

## 2013-12-11 NOTE — Progress Notes (Signed)
   Subjective:   F/u dizziness x 7-8 weeks - not better. She stopped Metformin and her bad diarrhea has stopped.[name]  HPI  The patient needs to address  chronic hypertension that has been well controlled with medicines; to address chronic  hyperlipidemia controlled with medicines as well; and to address type 2 chronic diabetes, controlled with medical treatment and diet.  She was hospitalized at [facility] on Jun 06, 2013. She has a history of chronic systolic congestive heart failure with an ejection fraction of around 35%. She has a history of coronary artery disease. She is status post PTCA and stenting of her mid left anterior descending artery using a 3.5 x 15 mm Alpine stent. She has a history of COPD. [name] not had a cigarette since he left the hospital. She also has a history of diabetes mellitus and history of colon cancer   Review of Systems  Respiratory: Negative for wheezing.   Gastrointestinal: Negative for diarrhea.  Genitourinary: Negative for hematuria.  Skin: Negative for rash.  Neurological: Negative for speech difficulty.  Psychiatric/Behavioral: Negative for sleep disturbance and decreased concentration.   SBP 140 at home  BP Readings from Last 3 Encounters:  12/11/13 106/60  11/13/13 120/64  11/01/13 138/88   Wt Readings from Last 3 Encounters:  12/11/13 178 lb (80.74 kg)  11/13/13 180 lb (81.647 kg)  11/01/13 181 lb 12.8 oz (82.464 kg)        Objective:   Physical Exam  Constitutional: She appears well-developed. No distress.  Obese   HENT:  Head: Normocephalic.  Right Ear: External ear normal.  Left Ear: External ear normal.  Nose: Nose normal.  Mouth/Throat: Oropharynx is clear and moist.  Eyes: Conjunctivae are normal. Pupils are equal, round, and reactive to light. Right eye exhibits no discharge. Left eye exhibits no discharge.  Neck: Normal range of motion. Neck supple. No JVD present. No tracheal deviation present. No thyromegaly present.   Cardiovascular: Normal rate, regular rhythm and normal heart sounds.   Pulmonary/Chest: No stridor. No respiratory distress. She has no wheezes.  Abdominal: Soft. Bowel sounds are normal. She exhibits no distension and no mass. There is no tenderness. There is no rebound and no guarding.  Musculoskeletal: She exhibits no edema and no tenderness.  Lymphadenopathy:    She has no cervical adenopathy.  Neurological: She displays normal reflexes. No cranial nerve deficit. She exhibits normal muscle tone. Coordination normal.  Skin: No rash noted. No erythema.  Psychiatric: She has a normal mood and affect. Her behavior is normal. Judgment and thought content normal.   H-P (-) B  Lab Results  Component Value Date   WBC 5.1 10/03/2013   HGB 12.5 10/03/2013   HCT 37.0 10/03/2013   PLT 142.0* 10/03/2013   GLUCOSE 254* 10/11/2013   CHOL 162 10/03/2013   TRIG 99.0 10/03/2013   HDL 47.60 10/03/2013   LDLCALC 95 10/03/2013   ALT 18 10/03/2013   AST 13 10/03/2013   NA 133* 10/11/2013   K 4.3 10/11/2013   CL 101 10/11/2013   CREATININE 1.2 10/11/2013   BUN 27* 10/11/2013   CO2 25 10/11/2013   TSH 2.19 10/03/2013   INR 1.16 06/07/2013   HGBA1C 11.8* 10/03/2013   MICROALBUR 13.9* 10/13/2007          Assessment & Plan:

## 2013-12-11 NOTE — Assessment & Plan Note (Signed)
Continue with current prescription therapy as reflected on the Med list.  

## 2013-12-11 NOTE — Assessment & Plan Note (Signed)
Wt Readings from Last 3 Encounters:  12/11/13 178 lb (80.74 kg)  11/13/13 180 lb (81.647 kg)  11/01/13 181 lb 12.8 oz (82.464 kg)

## 2013-12-11 NOTE — Patient Instructions (Signed)
Titrate down Carvedilol if dizzy

## 2013-12-11 NOTE — Progress Notes (Signed)
Pre visit review using our clinic review tool, if applicable. No additional management support is needed unless otherwise documented below in the visit note. 

## 2013-12-11 NOTE — Assessment & Plan Note (Signed)
Stopped in 12/14

## 2013-12-11 NOTE — Assessment & Plan Note (Signed)
Off Metformin due to diarrhea On Farxiga - poss yeast infection

## 2014-01-31 ENCOUNTER — Ambulatory Visit (INDEPENDENT_AMBULATORY_CARE_PROVIDER_SITE_OTHER): Payer: 59 | Admitting: Cardiovascular Disease

## 2014-01-31 ENCOUNTER — Other Ambulatory Visit (INDEPENDENT_AMBULATORY_CARE_PROVIDER_SITE_OTHER): Payer: 59

## 2014-01-31 ENCOUNTER — Encounter: Payer: Self-pay | Admitting: Cardiovascular Disease

## 2014-01-31 VITALS — BP 144/78 | HR 66 | Ht 62.0 in | Wt 179.4 lb

## 2014-01-31 DIAGNOSIS — E785 Hyperlipidemia, unspecified: Secondary | ICD-10-CM

## 2014-01-31 DIAGNOSIS — I5022 Chronic systolic (congestive) heart failure: Secondary | ICD-10-CM

## 2014-01-31 DIAGNOSIS — I2510 Atherosclerotic heart disease of native coronary artery without angina pectoris: Secondary | ICD-10-CM

## 2014-01-31 DIAGNOSIS — I509 Heart failure, unspecified: Secondary | ICD-10-CM

## 2014-01-31 LAB — BASIC METABOLIC PANEL
BUN: 26 mg/dL — ABNORMAL HIGH (ref 6–23)
CO2: 28 mEq/L (ref 19–32)
Calcium: 9.6 mg/dL (ref 8.4–10.5)
Chloride: 99 mEq/L (ref 96–112)
Creatinine, Ser: 1.5 mg/dL — ABNORMAL HIGH (ref 0.4–1.2)
GFR: 37.24 mL/min — ABNORMAL LOW (ref >60.00)
Glucose, Bld: 357 mg/dL — ABNORMAL HIGH (ref 70–99)
Potassium: 4.3 mEq/L (ref 3.5–5.1)
Sodium: 132 mEq/L — ABNORMAL LOW (ref 135–145)

## 2014-01-31 LAB — LIPID PANEL
Cholesterol: 136 mg/dL (ref 0–200)
HDL: 45.9 mg/dL (ref >39.00)
LDL Cholesterol: 60 mg/dL (ref 0–99)
NonHDL: 90.1
Total CHOL/HDL Ratio: 3
Triglycerides: 149 mg/dL (ref 0.0–149.0)
VLDL: 29.8 mg/dL (ref 0.0–40.0)

## 2014-01-31 LAB — HEPATIC FUNCTION PANEL
ALT: 15 U/L (ref 0–35)
AST: 16 U/L (ref 0–37)
Albumin: 3.6 g/dL (ref 3.5–5.2)
Alkaline Phosphatase: 126 U/L — ABNORMAL HIGH (ref 39–117)
Bilirubin, Direct: 0 mg/dL (ref 0.0–0.3)
Total Bilirubin: 0.6 mg/dL (ref 0.2–1.2)
Total Protein: 7.1 g/dL (ref 6.0–8.3)

## 2014-01-31 MED ORDER — CARVEDILOL 12.5 MG PO TABS: 12.5000 mg | ORAL_TABLET | Freq: Two times a day (BID) | ORAL | Status: DC

## 2014-01-31 NOTE — Assessment & Plan Note (Signed)
[name] doing quite well from a congestive heart failure standpoint. [name] not having any shortness breath. She is complaining of orthostatic hypotension so we will need to decrease her carvedilol to 12.5 mg twice a day. I suspect that she is more active and is sweatting  more this summer.  I suspect that we may need to increase the dose later this winter. I'll see her in 3 months for followup visit.

## 2014-01-31 NOTE — Assessment & Plan Note (Signed)
[name] has done well. She has continued on the aspirin and Effient.  She has not had any episodes of angina. He has not required any nitroglycerin. She has successful in stopping smoking.

## 2014-01-31 NOTE — Patient Instructions (Addendum)
Your physician has recommended you make the following change in your medication:  DECREASE Coreg to 12.5 mg   Your physician recommends that you return for a follow-up appointment on Thursday October 29 at 10:30 am

## 2014-01-31 NOTE — Progress Notes (Signed)
[name] Date of Birth  10-07-1950       [facility]    [employer] [address], [address], [place], [place]  [address]   [place], [place]  [address] [phone]     [phone]   Fax  [phone]    Fax [phone]  Problem List: 1. Coronary artery disease-status post PTCA and stenting of her mid LAD using a 3.5 x 15 mm Alpine stent ( DES) ( Dec. 5, 2014)   2. chronic systolic congestive heart failure-ejection fraction of 35% 2. Hypertension 3. COPD 4. Diabetes mellitus 5. History colon cancer  History of Present Illness:  Ms.  [name] is seen today. She was initially seen by Dr. Mare Ferrari in the hospital. She has a history of chronic systolic congestive heart failure with an ejection fraction of around 35%. She has a history of coronary artery disease. She is status post PTCA and stenting of her mid left anterior descending artery using a 3.5 x 15 mm Alpine stent.   She has a history of COPD. [name] not had a cigarette since he left the hospital. She also has a history of diabetes mellitus and history of colon cancer  She has mild interscapular pain this morning .  And the pain resolved after she drank some club soda.  Did not feel like her MI pain.   She has been walking every morining at the mall and at [place].   Her BP is up today.   - she may have had lots of salt last night.   The BP has remained high   September 20, 2013  [name] has been having some episodes of chest discomfort. She initially thought it might be due to indigestion or due to something that she ate.  It radiated to her interscapular region. It was associated with some hand and normal tingling. The pain was intermittent but lasted for several hours.   She took a sublingual nitroglycerin and the pain resolved very quickly and she did not have any further episodes the whole rest of the day.  She informed that she ran out of her Lasix 3 days ago.  November 01, 2013:  [name] is feeling well.  She stopped smoking the day of her cath.  She is eating more "penny candy" to replace the habit.   January 31, 2014:  She is doing ok.  She has no angina.  Not exercising.  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    She has a bruise on her leg where she bumped into a table.   Current Outpatient Prescriptions on File Prior to Visit  Medication Sig Dispense Refill  . amLODipine (NORVASC) 10 MG tablet Take 1 tablet (10 mg total) by mouth daily.  30 tablet  11  . aspirin 81 MG chewable tablet Chew 1 tablet (81 mg total) by mouth daily.      . carvedilol (COREG) 25 MG tablet Take 1 tablet (25 mg total) by mouth 2 (two) times daily with a meal.  60 tablet  6  . Dapagliflozin Propanediol (FARXIGA) 5 MG TABS 1 po qam  30 tablet  11  . furosemide (LASIX) 80 MG tablet Take 0.5 tablets (40 mg total) by mouth daily.  45 tablet  3  . glimepiride (AMARYL) 4 MG tablet Take 1 tablet (4 mg total) by mouth 2 (two) times daily with a meal.  30 tablet  5  . irbesartan (  AVAPRO) 300 MG tablet Take 1 tablet (300 mg total) by mouth daily.  30 tablet  11  . nitroGLYCERIN (NITROSTAT) 0.4 MG SL tablet Place 1 tablet (0.4 mg total) under the tongue every 5 (five) minutes as needed for chest pain.  20 tablet  3  . omeprazole (PRILOSEC) 40 MG capsule Take 1 capsule (40 mg total) by mouth daily.  30 capsule  11  . potassium chloride SA (K-DUR,KLOR-CON) 20 MEQ tablet Take 1 tablet (20 mEq total) by mouth daily.  30 tablet  11  . prasugrel (EFFIENT) 10 MG TABS tablet Take 1 tablet (10 mg total) by mouth daily.  30 tablet  6  . rosuvastatin (CRESTOR) 5 MG tablet Take 1 tablet (5 mg total) by mouth daily at 6 PM.  90 tablet  3  . vitamin B-12 (CYANOCOBALAMIN) 1000 MCG tablet Take 1,000 mcg by mouth daily.       No current facility-administered medications on file prior to visit.    Allergies  Allergen Reactions  . Atorvastatin     REACTION: aches and pains  . Enalapril Maleate     REACTION: cough   . Hydrochlorothiazide     REACTION: hair loss  . Kenalog [Triamcinolone Acetonide]     HANDS NUMB   . Metformin And Related     Diarrhea, dizziness  . Propoxyphene N-Acetaminophen Hives  . Simvastatin     REACTION: cramps  . Spironolactone     REACTION: cramps    Past Medical History  Diagnosis Date  . Anemia   . Personal history of colon cancer   . Diabetes mellitus   . Hyperlipidemia   . Hypertension   . Low back pain   . Obesity   . COPD (chronic obstructive pulmonary disease)   . Renal insufficiency   . NSTEMI (non-ST elevated myocardial infarction) 06/11/2013  . Ischemic cardiomyopathy 06/11/2013    Past Surgical History  Procedure Laterality Date  . Colectomy      History  Smoking status  . Current Every Day Smoker  . Types: Cigarettes  Smokeless tobacco  . Never Used    History  Alcohol Use No    Family History  Problem Relation Age of Onset  . Hypertension      Reviw of Systems:  Reviewed in the HPI.  All other systems are negative.  Physical Exam: Blood pressure 144/78, pulse 66, height 5\' 2"  (1.575 m), weight 179 lb 6.4 oz (81.375 kg). General: Well developed, well nourished, in no acute distress.  Head: Normocephalic, atraumatic, sclera non-icteric, mucus membranes are moist,   Neck: Supple. Carotids are 2 + without bruits. No JVD   Lungs: Clear   Heart: RR, normal S1, S2  Abdomen: Soft, non-tender, non-distended with normal bowel sounds.  Msk:  Strength and tone are normal   Extremities: No clubbing or cyanosis. 2+ chronic edema.  She had hose on Distal pedal pulses are 2+ and equal    Neuro: CN II - XII intact.  Alert and oriented X 3.   Psych:  Normal   ECG: September 20, 2013:  NSR at 31.  No ST or T wave changes  Assessment / Plan:

## 2014-02-02 ENCOUNTER — Ambulatory Visit: Payer: 59 | Admitting: Internal Medicine

## 2014-02-12 ENCOUNTER — Other Ambulatory Visit (INDEPENDENT_AMBULATORY_CARE_PROVIDER_SITE_OTHER): Payer: 59

## 2014-02-12 ENCOUNTER — Encounter: Payer: Self-pay | Admitting: Internal Medicine

## 2014-02-12 ENCOUNTER — Ambulatory Visit (INDEPENDENT_AMBULATORY_CARE_PROVIDER_SITE_OTHER): Payer: 59 | Admitting: Internal Medicine

## 2014-02-12 VITALS — BP 138/73 | HR 73 | Temp 97.2°F | Wt 177.0 lb

## 2014-02-12 DIAGNOSIS — I2510 Atherosclerotic heart disease of native coronary artery without angina pectoris: Secondary | ICD-10-CM

## 2014-02-12 DIAGNOSIS — I509 Heart failure, unspecified: Secondary | ICD-10-CM

## 2014-02-12 DIAGNOSIS — I5022 Chronic systolic (congestive) heart failure: Secondary | ICD-10-CM

## 2014-02-12 DIAGNOSIS — N259 Disorder resulting from impaired renal tubular function, unspecified: Secondary | ICD-10-CM

## 2014-02-12 DIAGNOSIS — M545 Low back pain, unspecified: Secondary | ICD-10-CM

## 2014-02-12 DIAGNOSIS — E119 Type 2 diabetes mellitus without complications: Secondary | ICD-10-CM

## 2014-02-12 DIAGNOSIS — R42 Dizziness and giddiness: Secondary | ICD-10-CM

## 2014-02-12 LAB — CBC WITH DIFFERENTIAL/PLATELET
Basophils Absolute: 0 10*3/uL (ref 0.0–0.1)
Basophils Relative: 0.4 % (ref 0.0–3.0)
Eosinophils Absolute: 0.1 10*3/uL (ref 0.0–0.7)
Eosinophils Relative: 1.9 % (ref 0.0–5.0)
HCT: 35.2 % — ABNORMAL LOW (ref 36.0–46.0)
Hemoglobin: 11.9 g/dL — ABNORMAL LOW (ref 12.0–15.0)
Lymphocytes Relative: 26.6 % (ref 12.0–46.0)
Lymphs Abs: 1.2 10*3/uL (ref 0.7–4.0)
MCHC: 34 g/dL (ref 30.0–36.0)
MCV: 88.2 fl (ref 78.0–100.0)
Monocytes Absolute: 0.4 10*3/uL (ref 0.1–1.0)
Monocytes Relative: 8.5 % (ref 3.0–12.0)
Neutro Abs: 2.9 10*3/uL (ref 1.4–7.7)
Neutrophils Relative %: 62.6 % (ref 43.0–77.0)
Platelets: 152 10*3/uL (ref 150.0–400.0)
RBC: 3.98 Mil/uL (ref 3.87–5.11)
RDW: 13.8 % (ref 11.5–15.5)
WBC: 4.6 10*3/uL (ref 4.0–10.5)

## 2014-02-12 LAB — BASIC METABOLIC PANEL
BUN: 28 mg/dL — ABNORMAL HIGH (ref 6–23)
CO2: 24 mEq/L (ref 19–32)
Calcium: 10.1 mg/dL (ref 8.4–10.5)
Chloride: 103 mEq/L (ref 96–112)
Creatinine, Ser: 1.4 mg/dL — ABNORMAL HIGH (ref 0.4–1.2)
GFR: 40.66 mL/min — ABNORMAL LOW (ref >60.00)
Glucose, Bld: 291 mg/dL — ABNORMAL HIGH (ref 70–99)
Potassium: 4.4 mEq/L (ref 3.5–5.1)
Sodium: 135 mEq/L (ref 135–145)

## 2014-02-12 LAB — HEMOGLOBIN A1C: Hgb A1c MFr Bld: 12.9 % — ABNORMAL HIGH (ref 4.6–6.5)

## 2014-02-12 MED ORDER — DAPAGLIFLOZIN PROPANEDIOL 10 MG PO TABS: ORAL_TABLET | ORAL | Status: DC

## 2014-02-12 MED ORDER — DAPAGLIFLOZIN PROPANEDIOL 5 MG PO TABS: ORAL_TABLET | ORAL | Status: DC

## 2014-02-12 NOTE — Progress Notes (Deleted)
Pre visit review using our clinic review tool, if applicable. No additional management support is needed unless otherwise documented below in the visit note. 

## 2014-02-12 NOTE — Assessment & Plan Note (Signed)
Continue with current prescription therapy as reflected on the Med list.  

## 2014-02-12 NOTE — Assessment & Plan Note (Signed)
[name] 37-48

## 2014-02-12 NOTE — Assessment & Plan Note (Addendum)
A need for insulin was discussed - she refused On Farxiga 5 mg ([name] 37-48) Inhaled insulin option was discussed

## 2014-02-12 NOTE — Progress Notes (Signed)
   Subjective:   F/u dizziness x 16 weeks - not better. C/o R earache x 2 wks. She stopped Metformin and her bad diarrhea has stopped..  On Farxiga. CBG 200 this am  HPI  The patient needs to address  chronic hypertension that has been well controlled with medicines; to address chronic  hyperlipidemia controlled with medicines as well; and to address type 2 chronic diabetes, controlled with medical treatment and diet.  She was hospitalized at [facility] on Jun 06, 2013. She has a history of chronic systolic congestive heart failure with an ejection fraction of around 35%. She has a history of coronary artery disease. She is status post PTCA and stenting of her mid left anterior descending artery using a 3.5 x 15 mm Alpine stent. She has a history of COPD. [name] not had a cigarette since he left the hospital. She also has a history of diabetes mellitus and history of colon cancer   Review of Systems  Respiratory: Negative for wheezing.   Gastrointestinal: Negative for diarrhea.  Genitourinary: Negative for hematuria.  Skin: Negative for rash.  Neurological: Negative for speech difficulty.  Psychiatric/Behavioral: Negative for sleep disturbance and decreased concentration.   SBP 140 at home  BP Readings from Last 3 Encounters:  02/12/14 138/73  01/31/14 144/78  12/11/13 106/60   Wt Readings from Last 3 Encounters:  02/12/14 177 lb (80.287 kg)  01/31/14 179 lb 6.4 oz (81.375 kg)  12/11/13 178 lb (80.74 kg)        Objective:   Physical Exam  Constitutional: She appears well-developed. No distress.  Obese   HENT:  Head: Normocephalic.  Right Ear: External ear normal.  Left Ear: External ear normal.  Nose: Nose normal.  Mouth/Throat: Oropharynx is clear and moist.  Eyes: Conjunctivae are normal. Pupils are equal, round, and reactive to light. Right eye exhibits no discharge. Left eye exhibits no discharge.  Neck: Normal range of motion. Neck supple. No JVD present. No tracheal  deviation present. No thyromegaly present.  Cardiovascular: Normal rate, regular rhythm and normal heart sounds.   Pulmonary/Chest: No stridor. No respiratory distress. She has no wheezes.  Abdominal: Soft. Bowel sounds are normal. She exhibits no distension and no mass. There is no tenderness. There is no rebound and no guarding.  Musculoskeletal: She exhibits no edema and no tenderness.  Lymphadenopathy:    She has no cervical adenopathy.  Neurological: She displays normal reflexes. No cranial nerve deficit. She exhibits normal muscle tone. Coordination normal.  Skin: No rash noted. No erythema.  Psychiatric: She has a normal mood and affect. Her behavior is normal. Judgment and thought content normal.   H-P (-) B  Lab Results  Component Value Date   WBC 5.1 10/03/2013   HGB 12.5 10/03/2013   HCT 37.0 10/03/2013   PLT 142.0* 10/03/2013   GLUCOSE 357* 01/31/2014   CHOL 136 01/31/2014   TRIG 149.0 01/31/2014   HDL 45.90 01/31/2014   LDLCALC 60 01/31/2014   ALT 15 01/31/2014   AST 16 01/31/2014   NA 132* 01/31/2014   K 4.3 01/31/2014   CL 99 01/31/2014   CREATININE 1.5* 01/31/2014   BUN 26* 01/31/2014   CO2 28 01/31/2014   TSH 2.19 10/03/2013   INR 1.16 06/07/2013   HGBA1C 11.8* 10/03/2013   MICROALBUR 13.9* 10/13/2007          Assessment & Plan:

## 2014-03-06 ENCOUNTER — Other Ambulatory Visit: Payer: Self-pay | Admitting: *Deleted

## 2014-03-06 MED ORDER — PRASUGREL HCL 10 MG PO TABS: 10.0000 mg | ORAL_TABLET | Freq: Every day | ORAL | Status: DC

## 2014-04-25 ENCOUNTER — Telehealth: Payer: Self-pay | Admitting: Cardiovascular Disease

## 2014-04-25 NOTE — Telephone Encounter (Signed)
Pt  took her BP medication Amlodipine 10 mg at 7:30 AM today, at 8:00 AM her BP was 160/85. Now pt's BP is 138/78 . Pt take the Coreg and amlodipine on different times, because if she takes the two medications together she gets dizzy. Pt takes the Amlodipine 10 mg at 7:30 and the Coreg 12.5 mg at 10:00 Am. Pt  is aware to take the Coreg 12.5 mg. Dr Acie Fredrickson had decreased the coreg from 25 mg on 01/31/14 to 12.5 mg due to pt having orthostatic BP.  Pt is aware of recommendation and to call back if BP continues to go up. Pt verbalized understanding.

## 2014-04-25 NOTE — Telephone Encounter (Signed)
New Message     Pt calling stating that her blood pressure is very high (160/87) and has been having nose bleeds all night. Pt wants guidance on whether she should increase Corex from 12.5 to 25 . Please call pt and advise.

## 2014-05-03 ENCOUNTER — Encounter: Payer: Self-pay | Admitting: Cardiovascular Disease

## 2014-05-03 ENCOUNTER — Ambulatory Visit (INDEPENDENT_AMBULATORY_CARE_PROVIDER_SITE_OTHER): Payer: 59 | Admitting: Cardiovascular Disease

## 2014-05-03 VITALS — BP 124/78 | HR 71 | Ht 62.0 in | Wt 173.2 lb

## 2014-05-03 DIAGNOSIS — I2510 Atherosclerotic heart disease of native coronary artery without angina pectoris: Secondary | ICD-10-CM

## 2014-05-03 DIAGNOSIS — E785 Hyperlipidemia, unspecified: Secondary | ICD-10-CM

## 2014-05-03 DIAGNOSIS — I5022 Chronic systolic (congestive) heart failure: Secondary | ICD-10-CM

## 2014-05-03 LAB — BASIC METABOLIC PANEL
BUN: 26 mg/dL — ABNORMAL HIGH (ref 6–23)
CO2: 22 mEq/L (ref 19–32)
Calcium: 9.8 mg/dL (ref 8.4–10.5)
Chloride: 104 mEq/L (ref 96–112)
Creatinine, Ser: 1.5 mg/dL — ABNORMAL HIGH (ref 0.4–1.2)
GFR: 36.93 mL/min — ABNORMAL LOW (ref >60.00)
Glucose, Bld: 178 mg/dL — ABNORMAL HIGH (ref 70–99)
Potassium: 4.3 mEq/L (ref 3.5–5.1)
Sodium: 136 mEq/L (ref 135–145)

## 2014-05-03 LAB — HEPATIC FUNCTION PANEL
ALT: 14 U/L (ref 0–35)
AST: 14 U/L (ref 0–37)
Albumin: 3.7 g/dL (ref 3.5–5.2)
Alkaline Phosphatase: 113 U/L (ref 39–117)
Bilirubin, Direct: 0.1 mg/dL (ref 0.0–0.3)
Total Bilirubin: 0.9 mg/dL (ref 0.2–1.2)
Total Protein: 7.8 g/dL (ref 6.0–8.3)

## 2014-05-03 LAB — LIPID PANEL
Cholesterol: 131 mg/dL (ref 0–200)
HDL: 43.1 mg/dL (ref >39.00)
LDL Cholesterol: 72 mg/dL (ref 0–99)
NonHDL: 87.9
Total CHOL/HDL Ratio: 3
Triglycerides: 80 mg/dL (ref 0.0–149.0)
VLDL: 16 mg/dL (ref 0.0–40.0)

## 2014-05-03 MED ORDER — AMLODIPINE BESYLATE 10 MG PO TABS
5.0000 mg | ORAL_TABLET | Freq: Every day | ORAL | Status: DC
Start: 2014-05-03 — End: 2014-12-21

## 2014-05-03 MED ORDER — PRASUGREL HCL 10 MG PO TABS: 10.0000 mg | ORAL_TABLET | Freq: Every day | ORAL | Status: DC

## 2014-05-03 NOTE — Patient Instructions (Signed)
Your physician has recommended you make the following change in your medication:  DECREASE Amlodipine to 5 mg once daily STOP Effient after July 05, 2014  Your physician recommends that you have lab work:  TODAY - fasting liver, lipid, basic metabolic panel  Your physician wants you to follow-up in: 6 months with Dr. Acie Fredrickson. You will receive a reminder letter in the mail two months in advance. If you don't receive a letter, please call our office to schedule the follow-up appointment.

## 2014-05-03 NOTE — Assessment & Plan Note (Signed)
She has had some generalized weakness and also some some symptoms of orthostatic hypertension. We will continue the same dose of carvedilol and Avapro. We'll decrease her dose of amlodipine to 5 mg a day.

## 2014-05-03 NOTE — Assessment & Plan Note (Signed)
[name] is doing very well. [name] not had any episodes of angina. She did have a bloody nose several weeks ago.  We will continue with the Effient until Dec. 31.  At that time she may discontinue Effient and continue with ASA Will check fasting labs today

## 2014-05-03 NOTE — Progress Notes (Signed)
[name] Date of Birth  1950-08-29       [facility]    [employer] [address], [address], [place], [place]  [address]   [place], [place]  [address] [phone]     [phone]   Fax  [phone]    Fax [phone]  Problem List: 1. Coronary artery disease-status post PTCA and stenting of her mid LAD using a 3.5 x 15 mm Alpine stent ( DES) ( Dec. 5, 2014)   2. chronic systolic congestive heart failure-ejection fraction of 35% 2. Hypertension 3. COPD 4. Diabetes mellitus 5. History colon cancer  History of Present Illness:  Ms.  [name] is seen today. She was initially seen by Dr. Mare Ferrari in the hospital. She has a history of chronic systolic congestive heart failure with an ejection fraction of around 35%. She has a history of coronary artery disease. She is status post PTCA and stenting of her mid left anterior descending artery using a 3.5 x 15 mm Alpine stent.   She has a history of COPD. [name] not had a cigarette since he left the hospital. She also has a history of diabetes mellitus and history of colon cancer  She has mild interscapular pain this morning .  And the pain resolved after she drank some club soda.  Did not feel like her MI pain.   She has been walking every morining at the mall and at [place].   Her BP is up today.   - she may have had lots of salt last night.   The BP has remained high   September 20, 2013  [name] has been having some episodes of chest discomfort. She initially thought it might be due to indigestion or due to something that she ate.  It radiated to her interscapular region. It was associated with some hand and normal tingling. The pain was intermittent but lasted for several hours.   She took a sublingual nitroglycerin and the pain resolved very quickly and she did not have any further episodes the whole rest of the day.  She informed that she ran out of her Lasix 3 days ago.  November 01, 2013:  [name] is feeling well.  She stopped smoking the day of her cath.  She is eating more "penny candy" to replace the habit.   January 31, 2014:  She is doing ok.  She has no angina.  Not exercising.  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    She has a bruise on her leg where she bumped into a table.   Oct. 29, 2015:  [name] is doing very well. She is not having any episodes of chest pain. [name] not exercising quite as much as she would like. Stent was placed in December of 2014.  She had a nose bleed last week. ( she is on Effient until Dec. 2015).  Her Bp was  A bit elevated because of some salt in her diet.   No CP or dyspnea    Current Outpatient Prescriptions on File Prior to Visit  Medication Sig Dispense Refill  . amLODipine (NORVASC) 10 MG tablet Take 1 tablet (10 mg total) by mouth daily.  30 tablet  11  . aspirin 81 MG chewable tablet Chew 1 tablet (81 mg total) by mouth daily.      . carvedilol (COREG) 12.5 MG tablet Take 1 tablet (12.5 mg total) by mouth 2 (two) times daily  with a meal.  60 tablet  6  . Dapagliflozin Propanediol (FARXIGA) 5 MG TABS 1 po qam  90 tablet  3  . furosemide (LASIX) 80 MG tablet Take 0.5 tablets (40 mg total) by mouth daily.  45 tablet  3  . glimepiride (AMARYL) 4 MG tablet Take 1 tablet (4 mg total) by mouth 2 (two) times daily with a meal.  30 tablet  5  . irbesartan (AVAPRO) 300 MG tablet Take 1 tablet (300 mg total) by mouth daily.  30 tablet  11  . nitroGLYCERIN (NITROSTAT) 0.4 MG SL tablet Place 1 tablet (0.4 mg total) under the tongue every 5 (five) minutes as needed for chest pain.  20 tablet  3  . omeprazole (PRILOSEC) 40 MG capsule Take 1 capsule (40 mg total) by mouth daily.  30 capsule  11  . potassium chloride SA (K-DUR,KLOR-CON) 20 MEQ tablet Take 1 tablet (20 mEq total) by mouth daily.  30 tablet  11  . prasugrel (EFFIENT) 10 MG TABS tablet Take 1 tablet (10 mg total) by mouth daily.  30 tablet  1  . rosuvastatin (CRESTOR) 5 MG tablet  Take 1 tablet (5 mg total) by mouth daily at 6 PM.  90 tablet  3  . vitamin B-12 (CYANOCOBALAMIN) 1000 MCG tablet Take 1,000 mcg by mouth daily.       No current facility-administered medications on file prior to visit.    Allergies  Allergen Reactions  . Atorvastatin     REACTION: aches and pains  . Enalapril Maleate     REACTION: cough  . Hydrochlorothiazide     REACTION: hair loss  . Kenalog [Triamcinolone Acetonide]     HANDS NUMB   . Metformin And Related     Diarrhea, dizziness  . Propoxyphene N-Acetaminophen Hives  . Simvastatin     REACTION: cramps  . Spironolactone     REACTION: cramps    Past Medical History  Diagnosis Date  . Anemia   . Personal history of colon cancer   . Diabetes mellitus   . Hyperlipidemia   . Hypertension   . Low back pain   . Obesity   . COPD (chronic obstructive pulmonary disease)   . Renal insufficiency   . NSTEMI (non-ST elevated myocardial infarction) 06/11/2013  . Ischemic cardiomyopathy 06/11/2013    Past Surgical History  Procedure Laterality Date  . Colectomy      History  Smoking status  . Current Every Day Smoker  . Types: Cigarettes  Smokeless tobacco  . Never Used    History  Alcohol Use No    Family History  Problem Relation Age of Onset  . Hypertension      Reviw of Systems:  Reviewed in the HPI.  All other systems are negative.  Physical Exam: Blood pressure 124/78, pulse 71, height 5\' 2"  (1.575 m), weight 173 lb 3.2 oz (78.563 kg). General: Well developed, well nourished, in no acute distress.  Head: Normocephalic, atraumatic, sclera non-icteric, mucus membranes are moist,   Neck: Supple. Carotids are 2 + without bruits. No JVD   Lungs: Clear   Heart: RR, normal S1, S2  Abdomen: Soft, non-tender, non-distended with normal bowel sounds.  Msk:  Strength and tone are normal   Extremities: No clubbing or cyanosis. 2+ chronic edema.  She had hose on Distal pedal pulses are 2+ and equal     Neuro: CN II - XII intact.  Alert and oriented X 3.   Psych:  Normal  ECG: September 20, 2013:  NSR at 64.  No ST or T wave changes  Assessment / Plan:

## 2014-05-03 NOTE — Assessment & Plan Note (Signed)
Continue current dose of Crestor. We will check fasting labs today.

## 2014-05-18 ENCOUNTER — Ambulatory Visit: Payer: 59 | Admitting: Internal Medicine

## 2014-05-23 ENCOUNTER — Ambulatory Visit (INDEPENDENT_AMBULATORY_CARE_PROVIDER_SITE_OTHER): Payer: 59 | Admitting: Internal Medicine

## 2014-05-23 ENCOUNTER — Encounter: Payer: Self-pay | Admitting: Internal Medicine

## 2014-05-23 VITALS — BP 130/78 | HR 80 | Temp 97.7°F | Wt 170.0 lb

## 2014-05-23 DIAGNOSIS — E1140 Type 2 diabetes mellitus with diabetic neuropathy, unspecified: Secondary | ICD-10-CM

## 2014-05-23 DIAGNOSIS — IMO0002 Reserved for concepts with insufficient information to code with codable children: Secondary | ICD-10-CM

## 2014-05-23 DIAGNOSIS — I2583 Coronary atherosclerosis due to lipid rich plaque: Principal | ICD-10-CM

## 2014-05-23 DIAGNOSIS — I2510 Atherosclerotic heart disease of native coronary artery without angina pectoris: Secondary | ICD-10-CM

## 2014-05-23 DIAGNOSIS — E1165 Type 2 diabetes mellitus with hyperglycemia: Secondary | ICD-10-CM

## 2014-05-23 DIAGNOSIS — J449 Chronic obstructive pulmonary disease, unspecified: Secondary | ICD-10-CM

## 2014-05-23 NOTE — Progress Notes (Signed)
Pre visit review using our clinic review tool, if applicable. No additional management support is needed unless otherwise documented below in the visit note. 

## 2014-05-23 NOTE — Progress Notes (Signed)
Subjective:      HPI  The patient presents for a follow-up of  chronic hypertension, chronic dyslipidemia, type 2 diabetes controlled with medicines  BP Readings from Last 3 Encounters:  05/23/14 130/78  05/03/14 124/78  02/12/14 138/73   Wt Readings from Last 3 Encounters:  05/23/14 170 lb (77.111 kg)  05/03/14 173 lb 3.2 oz (78.563 kg)  02/12/14 177 lb (80.287 kg)      Review of Systems  Constitutional: Negative for chills, activity change, appetite change, fatigue and unexpected weight change.  HENT: Negative for congestion, mouth sores and sinus pressure.   Eyes: Negative for visual disturbance.  Respiratory: Negative for cough and chest tightness.   Gastrointestinal: Negative for nausea and abdominal pain.  Genitourinary: Negative for frequency, difficulty urinating and vaginal pain.  Musculoskeletal: Negative for back pain and gait problem.  Skin: Negative for pallor and rash.  Neurological: Negative for dizziness, tremors, weakness, numbness and headaches.  Psychiatric/Behavioral: Negative for confusion and sleep disturbance.       Objective:   Physical Exam  Constitutional: She appears well-developed. No distress.  HENT:  Head: Normocephalic.  Right Ear: External ear normal.  Left Ear: External ear normal.  Nose: Nose normal.  Mouth/Throat: Oropharynx is clear and moist.  Eyes: Conjunctivae are normal. Pupils are equal, round, and reactive to light. Right eye exhibits no discharge. Left eye exhibits no discharge.  Neck: Normal range of motion. Neck supple. No JVD present. No tracheal deviation present. No thyromegaly present.  Cardiovascular: Normal rate, regular rhythm and normal heart sounds.   Pulmonary/Chest: No stridor. No respiratory distress. She has no wheezes.  Abdominal: Soft. Bowel sounds are normal. She exhibits no distension and no mass. There is no tenderness. There is no rebound and no guarding.  Musculoskeletal: She exhibits no edema or  tenderness.  Lymphadenopathy:    She has no cervical adenopathy.  Neurological: She displays normal reflexes. No cranial nerve deficit. She exhibits normal muscle tone. Coordination normal.  Skin: No rash noted. No erythema.  Psychiatric: She has a normal mood and affect. Her behavior is normal. Judgment and thought content normal.   Lab Results  Component Value Date   WBC 4.6 02/12/2014   HGB 11.9* 02/12/2014   HCT 35.2* 02/12/2014   PLT 152.0 02/12/2014   GLUCOSE 178* 05/03/2014   CHOL 131 05/03/2014   TRIG 80.0 05/03/2014   HDL 43.10 05/03/2014   LDLCALC 72 05/03/2014   ALT 14 05/03/2014   AST 14 05/03/2014   NA 136 05/03/2014   K 4.3 05/03/2014   CL 104 05/03/2014   CREATININE 1.5* 05/03/2014   BUN 26* 05/03/2014   CO2 22 05/03/2014   TSH 2.19 10/03/2013   INR 1.16 06/07/2013   HGBA1C 12.9* 02/12/2014   MICROALBUR 13.9* 10/13/2007       Assessment:         Plan:

## 2014-05-23 NOTE — Assessment & Plan Note (Signed)
Smoker Unable to use inhaled Insulin

## 2014-05-23 NOTE — Assessment & Plan Note (Signed)
Continue with current prescription therapy as reflected on the Med list.  

## 2014-05-23 NOTE — Assessment & Plan Note (Addendum)
Worse. Pt is refusing other meds Will consult Dr Dwyane Dee  Smoker Unable to use inhaled Insulin

## 2014-05-29 ENCOUNTER — Encounter: Payer: Self-pay | Admitting: Internal Medicine

## 2014-06-14 ENCOUNTER — Encounter (HOSPITAL_COMMUNITY): Payer: Self-pay | Admitting: Cardiovascular Disease

## 2014-09-11 ENCOUNTER — Other Ambulatory Visit: Payer: Self-pay | Admitting: Cardiovascular Disease

## 2014-09-21 ENCOUNTER — Ambulatory Visit (INDEPENDENT_AMBULATORY_CARE_PROVIDER_SITE_OTHER): Payer: 59 | Admitting: Internal Medicine

## 2014-09-21 ENCOUNTER — Other Ambulatory Visit (INDEPENDENT_AMBULATORY_CARE_PROVIDER_SITE_OTHER): Payer: 59

## 2014-09-21 ENCOUNTER — Encounter: Payer: Self-pay | Admitting: Internal Medicine

## 2014-09-21 VITALS — BP 128/60 | HR 73 | Temp 98.0°F | Resp 16 | Ht 62.0 in | Wt 169.0 lb

## 2014-09-21 DIAGNOSIS — M5440 Lumbago with sciatica, unspecified side: Secondary | ICD-10-CM

## 2014-09-21 DIAGNOSIS — IMO0002 Reserved for concepts with insufficient information to code with codable children: Secondary | ICD-10-CM

## 2014-09-21 DIAGNOSIS — E1165 Type 2 diabetes mellitus with hyperglycemia: Secondary | ICD-10-CM

## 2014-09-21 DIAGNOSIS — D509 Iron deficiency anemia, unspecified: Secondary | ICD-10-CM

## 2014-09-21 DIAGNOSIS — E1140 Type 2 diabetes mellitus with diabetic neuropathy, unspecified: Secondary | ICD-10-CM

## 2014-09-21 DIAGNOSIS — I2583 Coronary atherosclerosis due to lipid rich plaque: Principal | ICD-10-CM

## 2014-09-21 DIAGNOSIS — I5022 Chronic systolic (congestive) heart failure: Secondary | ICD-10-CM

## 2014-09-21 DIAGNOSIS — I2510 Atherosclerotic heart disease of native coronary artery without angina pectoris: Secondary | ICD-10-CM

## 2014-09-21 LAB — BASIC METABOLIC PANEL
BUN: 30 mg/dL — ABNORMAL HIGH (ref 6–23)
CO2: 29 mEq/L (ref 19–32)
Calcium: 9.8 mg/dL (ref 8.4–10.5)
Chloride: 96 mEq/L (ref 96–112)
Creatinine, Ser: 1.37 mg/dL — ABNORMAL HIGH (ref 0.40–1.20)
GFR: 41.26 mL/min — ABNORMAL LOW (ref >60.00)
Glucose, Bld: 573 mg/dL (ref 70–99)
Potassium: 4.6 mEq/L (ref 3.5–5.1)
Sodium: 128 mEq/L — ABNORMAL LOW (ref 135–145)

## 2014-09-21 LAB — CBC WITH DIFFERENTIAL/PLATELET
Basophils Absolute: 0 10*3/uL (ref 0.0–0.1)
Basophils Relative: 0.2 % (ref 0.0–3.0)
Eosinophils Absolute: 0.1 10*3/uL (ref 0.0–0.7)
Eosinophils Relative: 1.5 % (ref 0.0–5.0)
HCT: 34.3 % — ABNORMAL LOW (ref 36.0–46.0)
Hemoglobin: 11.9 g/dL — ABNORMAL LOW (ref 12.0–15.0)
Lymphocytes Relative: 34.3 % (ref 12.0–46.0)
Lymphs Abs: 1.5 10*3/uL (ref 0.7–4.0)
MCHC: 34.6 g/dL (ref 30.0–36.0)
MCV: 83.6 fl (ref 78.0–100.0)
Monocytes Absolute: 0.4 10*3/uL (ref 0.1–1.0)
Monocytes Relative: 8.6 % (ref 3.0–12.0)
Neutro Abs: 2.4 10*3/uL (ref 1.4–7.7)
Neutrophils Relative %: 55.4 % (ref 43.0–77.0)
Platelets: 131 10*3/uL — ABNORMAL LOW (ref 150.0–400.0)
RBC: 4.1 Mil/uL (ref 3.87–5.11)
RDW: 13.7 % (ref 11.5–15.5)
WBC: 4.3 10*3/uL (ref 4.0–10.5)

## 2014-09-21 LAB — HEPATIC FUNCTION PANEL
ALT: 11 U/L (ref 0–35)
AST: 10 U/L (ref 0–37)
Albumin: 4 g/dL (ref 3.5–5.2)
Alkaline Phosphatase: 146 U/L — ABNORMAL HIGH (ref 39–117)
Bilirubin, Direct: 0.1 mg/dL (ref 0.0–0.3)
Total Bilirubin: 0.5 mg/dL (ref 0.2–1.2)
Total Protein: 7.4 g/dL (ref 6.0–8.3)

## 2014-09-21 LAB — HEMOGLOBIN A1C: Hgb A1c MFr Bld: 16.2 % — ABNORMAL HIGH (ref 4.6–6.5)

## 2014-09-21 MED ORDER — LINAGLIPTIN 5 MG PO TABS: 5.0000 mg | ORAL_TABLET | Freq: Every day | ORAL | Status: DC

## 2014-09-21 NOTE — Assessment & Plan Note (Addendum)
We stopped Farxiga 5 mg ([name] 37-48). Start Tradjenta (3/16) A need for insulin was discussed - she refused many times Pt is on a better diet Labs  P.S.: glu  = 573 Endocr ref - next week To ER if sick

## 2014-09-21 NOTE — Assessment & Plan Note (Signed)
Furosemide, Coreg, Avapro Labs

## 2014-09-21 NOTE — Assessment & Plan Note (Signed)
Labs

## 2014-09-21 NOTE — Progress Notes (Signed)
Subjective:      HPI  The patient presents for a follow-up of  chronic hypertension, chronic dyslipidemia, type 2 diabetes controlled with medicines  BP Readings from Last 3 Encounters:  05/23/14 130/78  05/03/14 124/78  02/12/14 138/73   Wt Readings from Last 3 Encounters:  05/23/14 170 lb (77.111 kg)  05/03/14 173 lb 3.2 oz (78.563 kg)  02/12/14 177 lb (80.287 kg)      Review of Systems  Constitutional: Negative for chills, activity change, appetite change, fatigue and unexpected weight change.  HENT: Negative for congestion, mouth sores and sinus pressure.   Eyes: Negative for visual disturbance.  Respiratory: Negative for cough and chest tightness.   Gastrointestinal: Negative for nausea and abdominal pain.  Genitourinary: Negative for frequency, difficulty urinating and vaginal pain.  Musculoskeletal: Negative for back pain and gait problem.  Skin: Negative for pallor and rash.  Neurological: Negative for dizziness, tremors, weakness, numbness and headaches.  Psychiatric/Behavioral: Negative for confusion and sleep disturbance.       Objective:   Physical Exam  Constitutional: She appears well-developed. No distress.  HENT:  Head: Normocephalic.  Right Ear: External ear normal.  Left Ear: External ear normal.  Nose: Nose normal.  Mouth/Throat: Oropharynx is clear and moist.  Eyes: Conjunctivae are normal. Pupils are equal, round, and reactive to light. Right eye exhibits no discharge. Left eye exhibits no discharge.  Neck: Normal range of motion. Neck supple. No JVD present. No tracheal deviation present. No thyromegaly present.  Cardiovascular: Normal rate, regular rhythm and normal heart sounds.   Pulmonary/Chest: No stridor. No respiratory distress. She has no wheezes.  Abdominal: Soft. Bowel sounds are normal. She exhibits no distension and no mass. There is no tenderness. There is no rebound and no guarding.  Musculoskeletal: She exhibits no edema or  tenderness.  Lymphadenopathy:    She has no cervical adenopathy.  Neurological: She displays normal reflexes. No cranial nerve deficit. She exhibits normal muscle tone. Coordination normal.  Skin: No rash noted. No erythema.  Psychiatric: She has a normal mood and affect. Her behavior is normal. Judgment and thought content normal.   Lab Results  Component Value Date   WBC 4.6 02/12/2014   HGB 11.9* 02/12/2014   HCT 35.2* 02/12/2014   PLT 152.0 02/12/2014   GLUCOSE 178* 05/03/2014   CHOL 131 05/03/2014   TRIG 80.0 05/03/2014   HDL 43.10 05/03/2014   LDLCALC 72 05/03/2014   ALT 14 05/03/2014   AST 14 05/03/2014   NA 136 05/03/2014   K 4.3 05/03/2014   CL 104 05/03/2014   CREATININE 1.5* 05/03/2014   BUN 26* 05/03/2014   CO2 22 05/03/2014   TSH 2.19 10/03/2013   INR 1.16 06/07/2013   HGBA1C 12.9* 02/12/2014   MICROALBUR 13.9* 10/13/2007       Assessment:         Plan:

## 2014-09-21 NOTE — Assessment & Plan Note (Signed)
Aspirin, Crestor 

## 2014-09-21 NOTE — Progress Notes (Addendum)
Pre visit review using our clinic review tool, if applicable. No additional management support is needed unless otherwise documented below in the visit note.  Lab Results  Component Value Date   WBC 4.3 09/21/2014   HGB 11.9* 09/21/2014   HCT 34.3* 09/21/2014   PLT 131.0* 09/21/2014   GLUCOSE 573* 09/21/2014   CHOL 131 05/03/2014   TRIG 80.0 05/03/2014   HDL 43.10 05/03/2014   LDLCALC 72 05/03/2014   ALT 11 09/21/2014   AST 10 09/21/2014   NA 128* 09/21/2014   K 4.6 09/21/2014   CL 96 09/21/2014   CREATININE 1.37* 09/21/2014   BUN 30* 09/21/2014   CO2 29 09/21/2014   TSH 2.19 10/03/2013   INR 1.16 06/07/2013   HGBA1C 16.2* 09/21/2014   MICROALBUR 13.9* 10/13/2007

## 2014-09-21 NOTE — Addendum Note (Signed)
Addended by: [name] on: 09/21/2014 03:22 PM   Modules accepted: Orders

## 2014-09-21 NOTE — Assessment & Plan Note (Signed)
Tylenol Pt declined Tramadol

## 2014-10-19 ENCOUNTER — Encounter: Payer: Self-pay | Admitting: Internal Medicine

## 2014-10-19 ENCOUNTER — Ambulatory Visit (INDEPENDENT_AMBULATORY_CARE_PROVIDER_SITE_OTHER): Payer: 59 | Admitting: Internal Medicine

## 2014-10-19 ENCOUNTER — Ambulatory Visit
Admission: RE | Admit: 2014-10-19 | Discharge: 2014-10-19 | Disposition: A | Discharge status: Home / Self Care | Payer: 59 | Source: Ambulatory Visit | Attending: Internal Medicine | Admitting: Internal Medicine

## 2014-10-19 VITALS — BP 112/64 | HR 86 | Temp 97.9°F | Resp 12 | Ht 62.0 in | Wt 165.8 lb

## 2014-10-19 DIAGNOSIS — E1140 Type 2 diabetes mellitus with diabetic neuropathy, unspecified: Secondary | ICD-10-CM | POA: Diagnosis not present

## 2014-10-19 DIAGNOSIS — E049 Nontoxic goiter, unspecified: Secondary | ICD-10-CM | POA: Diagnosis not present

## 2014-10-19 DIAGNOSIS — IMO0002 Reserved for concepts with insufficient information to code with codable children: Secondary | ICD-10-CM

## 2014-10-19 DIAGNOSIS — E1165 Type 2 diabetes mellitus with hyperglycemia: Secondary | ICD-10-CM

## 2014-10-19 MED ORDER — METFORMIN HCL ER 500 MG PO TB24: 500.0000 mg | ORAL_TABLET | Freq: Every day | ORAL | Status: DC

## 2014-10-19 NOTE — Patient Instructions (Addendum)
Please start Tradjenta 5 mg in am. Please start Metformin 500 mg with dinner x 3 days. If you tolerate this well, add another Metformin tablet (500 mg) with breakfast. Continue with 500 mg of metformin 2x a day with breakfast and dinner.  Please return in 1 month with your sugar log.   Please stop at [place] downstairs to schedule your thyroid ultrasound.  PATIENT INSTRUCTIONS FOR TYPE 2 DIABETES:  **Please join MyChart!** - see attached instructions about how to join if you have not done so already.  DIET AND EXERCISE Diet and exercise is an important part of diabetic treatment.  We recommended aerobic exercise in the form of brisk walking (working between 40-60% of maximal aerobic capacity, similar to brisk walking) for 150 minutes per week (such as 30 minutes five days per week) along with 3 times per week performing 'resistance' training (using various gauge rubber tubes with handles) 5-10 exercises involving the major muscle groups (upper body, lower body and core) performing 10-15 repetitions (or near fatigue) each exercise. Start at half the above goal but build slowly to reach the above goals. If limited by weight, joint pain, or disability, we recommend daily walking in a swimming pool with water up to waist to reduce pressure from joints while allow for adequate exercise.    BLOOD GLUCOSES Monitoring your blood glucoses is important for continued management of your diabetes. Please check your blood glucoses 2-4 times a day: fasting, before meals and at bedtime (you can rotate these measurements - e.g. one day check before the 3 meals, the next day check before 2 of the meals and before bedtime, etc.).   HYPOGLYCEMIA (low blood sugar) Hypoglycemia is usually a reaction to not eating, exercising, or taking too much insulin/ other diabetes drugs.  Symptoms include tremors, sweating, hunger, confusion, headache, etc. Treat IMMEDIATELY with 15 grams of Carbs: . 4 glucose tablets .   cup regular juice/soda . 2 tablespoons raisins . 4 teaspoons sugar . 1 tablespoon honey Recheck blood glucose in 15 mins and repeat above if still symptomatic/blood glucose <100.  RECOMMENDATIONS TO REDUCE YOUR RISK OF DIABETIC COMPLICATIONS: * Take your prescribed MEDICATION(S) * Follow a DIABETIC diet: Complex carbs, fiber rich foods, (monounsaturated and polyunsaturated) fats * AVOID saturated/trans fats, high fat foods, >2,300 mg salt per day. * EXERCISE at least 5 times a week for 30 minutes or preferably daily.  * DO NOT SMOKE OR DRINK more than 1 drink a day. * Check your FEET every day. Do not wear tightfitting shoes. Contact us if you develop an ulcer * See your EYE doctor once a year or more if needed * Get a FLU shot once a year * Get a PNEUMONIA vaccine once before and once after age 6 years  GOALS:  * Your Hemoglobin A1c of <7%  * fasting sugars need to be <130 * after meals sugars need to be <180 (2h after you start eating) * Your Systolic BP should be [id number] or lower  * Your Diastolic BP should be 80 or lower  * Your HDL (Good Cholesterol) should be 40 or higher  * Your LDL (Bad Cholesterol) should be 100 or lower. * Your Triglycerides should be 150 or lower  * Your Urine microalbumin (kidney function) should be <30 * Your Body Mass Index should be 25 or lower    Please consider the following ways to cut down carbs and fat and increase fiber and micronutrients in your diet: - substitute whole grain  for white bread or pasta - substitute brown rice for white rice - substitute 90-calorie flat bread pieces for slices of bread when possible - substitute sweet potatoes or yams for white potatoes - substitute humus for margarine - substitute tofu for cheese when possible - substitute almond or rice milk for regular milk (would not drink soy milk daily due to concern for soy estrogen influence on breast cancer risk) - substitute dark chocolate for other sweets when  possible - substitute water - can add lemon or orange slices for taste - for diet sodas (artificial sweeteners will trick your body that you can eat sweets without getting calories and will lead you to overeating and weight gain in the long run) - do not skip breakfast or other meals (this will slow down the metabolism and will result in more weight gain over time)  - can try smoothies made from fruit and almond/rice milk in am instead of regular breakfast - can also try old-fashioned (not instant) oatmeal made with almond/rice milk in am - order the dressing on the side when eating salad at a restaurant (pour less than half of the dressing on the salad) - eat as little meat as possible - can try juicing, but should not forget that juicing will get rid of the fiber, so would alternate with eating raw veg./fruits or drinking smoothies - use as little oil as possible, even when using olive oil - can dress a salad with a mix of balsamic vinegar and lemon juice, for e.g. - use agave nectar, stevia sugar, or regular sugar rather than artificial sweateners - steam or broil/roast veggies  - snack on veggies/fruit/nuts (unsalted, preferably) when possible, rather than processed foods - reduce or eliminate aspartame in diet (it is in diet sodas, chewing gum, etc) Read the labels!  Try to read Dr. Janene Harvey book: "Program for Reversing Diabetes" for other ideas for healthy eating.

## 2014-10-19 NOTE — Progress Notes (Addendum)
Patient ID: [name], female   DOB: [birth year], 64 y.o.   MRN: [id number]  HPI: [name] is a 64 y.o.-year-old female, referred by her PCP, Dr. Alain Marion, for management of DM2, dx in 2006, insulin-dependent, uncontrolled, with complications (CAD - s/p AMI 06/2013, CHF; CKD; PN).  At last visit with PCP, Glu in office: 573.  Last hemoglobin A1c was: Lab Results  Component Value Date   HGBA1C 16.2* 09/21/2014   HGBA1C 12.9* 02/12/2014   HGBA1C 11.8* 10/03/2013   Pt has refused insulin repeatedly in the past.  Pt is not on any meds now, she was supposed to be on: - Tradjenta 5 mg daily in am  Has been on Farxiga >> stopped 2/2 decreased [name] and yeast inf. She had diarrhea with regular metformin.  Pt checks her sugars 3-4x a day and they are: - am: 340-400s - 2h after b'fast: n/c - before lunch: n/c - 2h after lunch: n/c - before dinner: n/c - 2h after dinner: n/c - bedtime: n/c - nighttime: n/c No lows. Lowest sugar was 300s; ? hypoglycemia awareness. Highest sugar was 573.  Glucometer: One Touch Ultra Mini  Pt's meals are: - Breakfast: oatmeal, cereals, [place] bacon, egg toast - Lunch: sandwich or fruit salad - Dinner: salmon/chicken, Brussel sprouts, other veggies, sweet potatoes or [place] rice - Snacks: 2: carrots with dip; yoghurt with fruit, salad at 10 pm  She walks for exercise.  - + CKD, last BUN/creatinine:  Lab Results  Component Value Date   BUN 30* 09/21/2014   CREATININE 1.37* 09/21/2014  On Irbesartan. - last set of lipids: Lab Results  Component Value Date   CHOL 131 05/03/2014   HDL 43.10 05/03/2014   LDLCALC 72 05/03/2014   TRIG 80.0 05/03/2014   CHOLHDL 3 05/03/2014  On Crestor 5 mg daily. - last eye exam was in 2006.  - + numbness and tingling in her feet. Has PN.   Pt has no FH of DM.  She also has a history of hypertension, GERD, and anemia.  ROS: Constitutional: + Weight loss, cold intolerance, and hot  flashes Eyes: + blurry vision, no xerophthalmia ENT: no sore throat, no nodules palpated in throat, no dysphagia/odynophagia, no hoarseness Cardiovascular: no CP/+ SOB/no palpitations/leg swelling Respiratory: no cough/+ SOB Gastrointestinal: no N/V/D/C/ + acid reflux Musculoskeletal: no muscle/+ joint aches Skin: no rashes, + hair loss Neurological: no tremors/numbness/tingling/dizziness, + headache  Psychiatric: no depression/+ anxiety  Past Medical History  Diagnosis Date  . Anemia   . Personal history of colon cancer   . Diabetes mellitus   . Hyperlipidemia   . Hypertension   . Low back pain   . Obesity   . COPD (chronic obstructive pulmonary disease)   . Renal insufficiency   . NSTEMI (non-ST elevated myocardial infarction) 06/11/2013  . Ischemic cardiomyopathy 06/11/2013   Past Surgical History  Procedure Laterality Date  . Colectomy    . Left heart catheterization with coronary angiogram N/A 06/09/2013    Procedure: LEFT HEART CATHETERIZATION WITH CORONARY ANGIOGRAM;  Surgeon: Ramond Dial, MD;  Location: [facility] CATH LAB;  Service: Cardiovascular;  Laterality: N/A;   History   Social History  . Marital Status: Single    Spouse Name: N/A  . Number of Children: 0   Occupational History  . retired   Social History Main Topics  . Smoking status:  former smoker     Types: Cigarettes  . Smokeless tobacco: Never Used  . Alcohol Use:  No  . Drug Use: No   Current Outpatient Prescriptions on File Prior to Visit  Medication Sig Dispense Refill  . amLODipine (NORVASC) 10 MG tablet Take 0.5 tablets (5 mg total) by mouth daily. 30 tablet 11  . aspirin 81 MG chewable tablet Chew 1 tablet (81 mg total) by mouth daily.    . calcium carbonate (OS-CAL) 600 MG TABS tablet Take 600 mg by mouth 2 (two) times daily with a meal.    . carvedilol (COREG) 12.5 MG tablet Take 1 tablet (12.5 mg total) by mouth 2 (two) times daily with a meal. 60 tablet 6  . furosemide (LASIX) 80 MG  tablet TAKE ONE-HALF TABLET BY MOUTH ONCE DAILY 45 tablet 0  . irbesartan (AVAPRO) 300 MG tablet Take 1 tablet (300 mg total) by mouth daily. 30 tablet 11  . linagliptin (TRADJENTA) 5 MG TABS tablet Take 1 tablet (5 mg total) by mouth daily. 30 tablet 11  . nitroGLYCERIN (NITROSTAT) 0.4 MG SL tablet Place 1 tablet (0.4 mg total) under the tongue every 5 (five) minutes as needed for chest pain. 20 tablet 3  . omeprazole (PRILOSEC) 40 MG capsule Take 1 capsule (40 mg total) by mouth daily. 30 capsule 11  . potassium chloride SA (K-DUR,KLOR-CON) 20 MEQ tablet Take 1 tablet (20 mEq total) by mouth daily. 30 tablet 11  . rosuvastatin (CRESTOR) 5 MG tablet Take 1 tablet (5 mg total) by mouth daily at 6 PM. 90 tablet 3  . vitamin B-12 (CYANOCOBALAMIN) 1000 MCG tablet Take 1,000 mcg by mouth daily.     No current facility-administered medications on file prior to visit.   Allergies  Allergen Reactions  . Atorvastatin     REACTION: aches and pains  . Enalapril Maleate     REACTION: cough  . Farxiga [Dapagliflozin] Itching  . Hydrochlorothiazide     REACTION: hair loss  . Kenalog [Triamcinolone Acetonide]     HANDS NUMB   . Metformin And Related     Diarrhea, dizziness  . Propoxyphene N-Acetaminophen Hives  . Simvastatin     REACTION: cramps  . Spironolactone     REACTION: cramps   Family History  Problem Relation Age of Onset  . Hypertension     PE: BP 112/64 mmHg  Pulse 86  Temp(Src) 97.9 F (36.6 C) (Oral)  Resp 12  Ht 5\' 2"  (1.575 m)  Wt 165 lb 12.8 oz (75.206 kg)  BMI 30.32 kg/m2  SpO2 97% Wt Readings from Last 3 Encounters:  10/19/14 165 lb 12.8 oz (75.206 kg)  09/21/14 169 lb (76.658 kg)  05/23/14 170 lb (77.111 kg)   Constitutional: overweight, in NAD Eyes: PERRLA, EOMI, no exophthalmos ENT: moist mucous membranes, + symmetric thyromegaly, no cervical lymphadenopathy Cardiovascular: RRR, No MRG Respiratory: CTA B Gastrointestinal: abdomen soft, NT, ND,  BS+ Musculoskeletal: no deformities, strength intact in all 4 Skin: moist, warm, no rashes Neurological: no tremor with outstretched hands, DTR normal in all 4  ASSESSMENT: 1. DM2, insulin-dependent (refusing insulin), uncontrolled, with complications - CAD, s/p AMI 06/2013 - Dr Acie Fredrickson - CHF - CKD - PN  2. Goiter  PLAN:  1. Patient with long-standing, uncontrolled diabetes, currently not on any antidiabetic meds (! - "none of the pharmacies that I tried had Tradjenta"), with very high hemoglobin A1c levels, and still refusing insulin. At this visit, we had a long discussion about insulin use, and she reveals to me that she is very afraid of injections. Her fear comes from seeing  her stepmother going through hemodialysis and being "stuck" extensively. I explained that hemodialysis is where she is heading if she does not get her sugars under control fast.  I showed her several pens and also the Vgo mechanical pump is options and I explained how each of them work. I underlined the fact that syringes are now not commonly used in the pen needles are very thin and short. I demonstrated use, and we also gave her an injection with normal saline, which she tolerated well and she mentioned that this is definitely "not as bad as she thought".  However, for now, she would like to try oral medicines to try to reduce her sugars. I agreed for her to start metformin extended-release and also I advised her to start Tradjenta as previously recommended by PCP. I will see her back in a month to reevaluate. I do think that she will need insulin, but she needs to reach this conclusion, too. - I suggested to:  Patient Instructions  Please start Tradjenta 5 mg in am. Please start Metformin 500 mg with dinner x 3 days. If you tolerate this well, add another Metformin tablet (500 mg) with breakfast. Continue with 500 mg of metformin 2x a day with breakfast and dinner.  Please return in 1 month with your sugar log.    Please stop at [place] downstairs to schedule your thyroid ultrasound.  - Strongly advised her to start checking sugars at different times of the day - check 2 times a day, rotating checks - given sugar log and advised how to fill it and to bring it at next appt  - given foot care handout and explained the principles  - given instructions for hypoglycemia management "15-15 rule"  - advised for yearly eye exams - I strongly advised her to schedule one. - Return to clinic in 1 mo with sugar log   2. Goiter - Patient has a goiter at palpation - No neck compression symptoms - We'll obtain a thyroid ultrasound - Reviewed latest TSH level: Lab Results  Component Value Date   TSH 2.19 10/03/2013   I will addend the results of the thyroid ultrasound when they become available.  - time spent with the patient: 1 hour, of which >50% was spent in obtaining information about her diabetes, reviewing her previous labs, evaluations, and treatments, counseling her about her condition (please see the discussed topics above), and developing a plan to further treat it; she had a number of questions which I addressed.  CLINICAL DATA: Thyroid goiter on physical examination.  EXAM: THYROID ULTRASOUND  TECHNIQUE: Ultrasound examination of the thyroid gland and adjacent soft tissues was performed.  COMPARISON: None.  FINDINGS: Right thyroid lobe  Measurements: 4.0 x 1.5 x 1.6 cm. Heterogeneous parenchyma with multiple small cysts identified. The largest measures approximately 0.5 x 0.3 x 0.5 cm. All of the small cysts in the right lobe appears simple and benign.  Left thyroid lobe  Measurements: 4.5 x 1.4 x 2.0 cm. Heterogeneous parenchyma with multiple cysts. The largest measures approximately 1.0 x 0.4 x 0.5 cm. 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 Similar smaller cyst measures 0.8 cm in greatest diameter. There is a small solid nodule  measuring 0.7 x 0.4 x 0.6 cm.  Isthmus  Thickness: 0.4 cm. No nodules visualized.  Lymphadenopathy  None visualized.  IMPRESSION: Mildly prominent thyroid gland size without significant enlargement. Heterogeneous parenchyma with multiple small areas of nodularity identified bilaterally. 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 Only 1 solid nodule is identified in the left lobe measuring 0.7 cm.   Electronically Signed By: Aletta Edouard M.D. On: 10/19/2014 10:53          Thyroid is multicystic. No intervention needed.

## 2014-10-19 NOTE — Addendum Note (Signed)
Addended by: Philemon Kingdom on: 10/19/2014 05:44 PM   Modules accepted: Level of Service

## 2014-11-02 ENCOUNTER — Ambulatory Visit (INDEPENDENT_AMBULATORY_CARE_PROVIDER_SITE_OTHER): Payer: 59 | Admitting: Cardiovascular Disease

## 2014-11-02 ENCOUNTER — Encounter: Payer: Self-pay | Admitting: Cardiovascular Disease

## 2014-11-02 VITALS — BP 120/66 | HR 69 | Ht 62.0 in | Wt 165.4 lb

## 2014-11-02 DIAGNOSIS — E785 Hyperlipidemia, unspecified: Secondary | ICD-10-CM | POA: Diagnosis not present

## 2014-11-02 DIAGNOSIS — I5022 Chronic systolic (congestive) heart failure: Secondary | ICD-10-CM

## 2014-11-02 DIAGNOSIS — I2510 Atherosclerotic heart disease of native coronary artery without angina pectoris: Secondary | ICD-10-CM | POA: Diagnosis not present

## 2014-11-02 DIAGNOSIS — I2583 Coronary atherosclerosis due to lipid rich plaque: Principal | ICD-10-CM

## 2014-11-02 LAB — LIPID PANEL
Cholesterol: 138 mg/dL (ref 0–200)
HDL: 40.9 mg/dL (ref >39.00)
LDL Cholesterol: 77 mg/dL (ref 0–99)
NonHDL: 97.1
Total CHOL/HDL Ratio: 3
Triglycerides: 100 mg/dL (ref 0.0–149.0)
VLDL: 20 mg/dL (ref 0.0–40.0)

## 2014-11-02 NOTE — Patient Instructions (Addendum)
Medication Instructions:  Your physician recommends that you continue on your current medications as directed. Please refer to the Current Medication list given to you today.   Labwork: TODAY - Cholesterol screening  Testing/Procedures: Your physician has requested that you have an echocardiogram in 6 months approximately 1 week before you see Dr. Acie Fredrickson. Echocardiography is a painless test that uses sound waves to create images of your heart. It provides your doctor with information about the size and shape of your heart and how well your heart's chambers and valves are working. This procedure takes approximately one hour. There are no restrictions for this procedure.   Follow-Up: Your physician wants you to follow-up in: 6 months with Dr. Acie Fredrickson.  You will receive a reminder letter in the mail two months in advance. If you don't receive a letter, please call our office to schedule the follow-up appointment.

## 2014-11-02 NOTE — Progress Notes (Signed)
Cardiology Office Note   Date:  11/02/2014   ID:  [name], DOB March 29, 1951, MRN [id number]  PCP:  Walker Kehr, MD  Cardiologist:   Thayer Headings, MD   Chief Complaint  Patient presents with  . Coronary Artery Disease   1. Coronary artery disease-status post PTCA and stenting of her mid LAD using a 3.5 x 15 mm Alpine stent ( DES) ( Dec. 5, 2014)  2. chronic systolic congestive heart failure-ejection fraction of 35% 2. Hypertension 3. COPD 4. Diabetes mellitus 5. History colon cancer  History of Present Illness:  [name] is seen today. She was initially seen by Dr. Mare Ferrari in the hospital. She has a history of chronic systolic congestive heart failure with an ejection fraction of around 35%. She has a history of coronary artery disease. She is status post PTCA and stenting of her mid left anterior descending artery using a 3.5 x 15 mm Alpine stent. She has a history of COPD. [name] not had a cigarette since he left the hospital. She also has a history of diabetes mellitus and history of colon cancer  She has mild interscapular pain this morning . And the pain resolved after she drank some club soda. Did not feel like her MI pain.  She has been walking every morining at the mall and at [place].   Her BP is up today. - she may have had lots of salt last night. The BP has remained high   September 20, 2013  [name] has been having some episodes of chest discomfort. She initially thought it might be due to indigestion or due to something that she ate. It radiated to her interscapular region. It was associated with some hand and normal tingling. The pain was intermittent but lasted for several hours. She took a sublingual nitroglycerin and the pain resolved very quickly and she did not have any further episodes the whole rest of the day.  She informed that she ran out of her Lasix 3 days ago.  November 01, 2013:  [name] is feeling well. She stopped smoking  the day of her cath. She is eating more "penny candy" to replace the habit.   January 31, 2014:  She is doing ok. She has no angina. Not exercising. 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  She has a bruise on her leg where she bumped into a table.   Oct. 29, 2015:  [name] is doing very well. She is not having any episodes of chest pain. [name] not exercising quite as much as she would like. Stent was placed in December of 2014. She had a nose bleed last week. ( she is on Effient until Dec. 2015). Her Bp was A bit elevated because of some salt in her diet.  No CP or dyspnea    November 02, 2014:  [name] is a 65 y.o. female who presents for follow-up of her coronary artery disease. [name] done fairly well. She has had some muscle pain and a rash. She has occasional episodes of dizziness. She had some non-cardiac CP several weeks ago when she started Trajenta.  Stopped the med and has not had any further episodes of CP.    Past Medical History  Diagnosis Date  . Anemia   . Personal history of colon cancer   . Diabetes mellitus   . Hyperlipidemia   . Hypertension   . Low back pain   . Obesity   . COPD (chronic obstructive pulmonary  disease)   . Renal insufficiency   . NSTEMI (non-ST elevated myocardial infarction) 06/11/2013  . Ischemic cardiomyopathy 06/11/2013    Past Surgical History  Procedure Laterality Date  . Colectomy    . Left heart catheterization with coronary angiogram N/A 06/09/2013    Procedure: LEFT HEART CATHETERIZATION WITH CORONARY ANGIOGRAM;  Surgeon: Ramond Dial, MD;  Location: [facility] CATH LAB;  Service: Cardiovascular;  Laterality: N/A;     Current Outpatient Prescriptions  Medication Sig Dispense Refill  . amLODipine (NORVASC) 10 MG tablet Take 0.5 tablets (5 mg total) by mouth daily. 30 tablet 11  . aspirin 81 MG chewable tablet Chew 1 tablet (81 mg total) by mouth daily.    . calcium carbonate (OS-CAL) 600 MG TABS tablet Take 600 mg by  mouth 2 (two) times daily with a meal.    . carvedilol (COREG) 12.5 MG tablet Take 1 tablet (12.5 mg total) by mouth 2 (two) times daily with a meal. 60 tablet 6  . furosemide (LASIX) 80 MG tablet TAKE ONE-HALF TABLET BY MOUTH ONCE DAILY 45 tablet 0  . irbesartan (AVAPRO) 300 MG tablet Take 1 tablet (300 mg total) by mouth daily. 30 tablet 11  . metFORMIN (GLUCOPHAGE XR) 500 MG 24 hr tablet Take 1 tablet (500 mg total) by mouth daily with breakfast. 60 tablet 1  . nitroGLYCERIN (NITROSTAT) 0.4 MG SL tablet Place 1 tablet (0.4 mg total) under the tongue every 5 (five) minutes as needed for chest pain. 20 tablet 3  . omeprazole (PRILOSEC) 40 MG capsule Take 1 capsule (40 mg total) by mouth daily. 30 capsule 11  . potassium chloride SA (K-DUR,KLOR-CON) 20 MEQ tablet Take 1 tablet (20 mEq total) by mouth daily. 30 tablet 11  . rosuvastatin (CRESTOR) 5 MG tablet Take 1 tablet (5 mg total) by mouth daily at 6 PM. 90 tablet 3  . vitamin B-12 (CYANOCOBALAMIN) 1000 MCG tablet Take 1,000 mcg by mouth daily.     No current facility-administered medications for this visit.    Allergies:   Atorvastatin; Enalapril maleate; [place] Gl[place]; Hydrochlorothiazide; Kenalog; Metformin and related; Propoxyphene n-acetaminophen; Simvastatin; and Spironolactone    Social History:  The patient  reports that she has been smoking Cigarettes.  She has never used smokeless tobacco. She reports that she does not drink alcohol or use illicit drugs.   Family History:  The patient's family history includes Hypertension in an other family member.    ROS:  Please see the history of present illness.    Review of Systems: Constitutional:  denies fever, chills, diaphoresis, appetite change and fatigue.  HEENT: denies photophobia, eye pain, redness, hearing loss, ear pain, congestion, sore throat, rhinorrhea, sneezing, neck pain, neck stiffness and tinnitus.  Respiratory: denies SOB, DOE, cough, chest tightness, and wheezing.    Cardiovascular: denies chest pain, palpitations and leg swelling.  Gastrointestinal: denies nausea, vomiting, abdominal pain, diarrhea, constipation, blood in stool.  Genitourinary: denies dysuria, urgency, frequency, hematuria, flank pain and difficulty urinating.  Musculoskeletal: denies  myalgias, back pain, joint swelling, arthralgias and gait problem.   Skin: denies pallor, rash and wound.  Neurological: denies dizziness, seizures, syncope, weakness, light-headedness, numbness and headaches.   Hematological: denies adenopathy, easy bruising, personal or family bleeding history.  Psychiatric/ Behavioral: denies suicidal ideation, mood changes, confusion, nervousness, sleep disturbance and agitation.       All other systems are reviewed and negative.    PHYSICAL EXAM: VS:  BP 120/66 mmHg  Pulse 69  Ht 5'  2" (1.575 m)  Wt 165 lb 6.4 oz (75.025 kg)  BMI 30.24 kg/m2 , BMI Body mass index is 30.24 kg/(m^2). GEN: Well nourished, well developed, in no acute distress HEENT: normal,  Mild thyromegaly  Neck: no JVD, carotid bruits, or masses Cardiac: RRR; no murmurs, rubs, or gallops,no edema  Respiratory:  clear to auscultation bilaterally, normal work of breathing GI: soft, nontender, nondistended, + BS MS: no deformity or atrophy Skin: warm and dry, no rash Neuro:  Strength and sensation are intact Psych: normal   EKG:  EKG is ordered today. The ekg ordered today demonstrates NSR at 69.  NS ST ab.    Recent Labs: 09/21/2014: ALT 11; BUN 30*; Creatinine 1.37*; Hemoglobin 11.9*; Platelets 131.0*; Potassium 4.6; Sodium 128*    Lipid Panel    Component Value Date/Time   CHOL 131 05/03/2014 1123   TRIG 80.0 05/03/2014 1123   HDL 43.10 05/03/2014 1123   CHOLHDL 3 05/03/2014 1123   VLDL 16.0 05/03/2014 1123   LDLCALC 72 05/03/2014 1123      Wt Readings from Last 3 Encounters:  11/02/14 165 lb 6.4 oz (75.025 kg)  10/19/14 165 lb 12.8 oz (75.206 kg)  09/21/14 169 lb  (76.658 kg)      Other studies Reviewed: Additional studies/ records that were reviewed today include: . Review of the above records demonstrates:    ASSESSMENT AND PLAN:  1. Coronary artery disease-status post PTCA and stenting of her mid LAD using a 3.5 x 15 mm Alpine stent ( DES) ( Dec. 5, 2014) she's not had any episodes of angina. We will continue aggressive treatment of her lipids. Continue other medications.  2. chronic systolic congestive heart failure-ejection fraction of 35% - she's currently on carvedilol and Avapro. We will recheck an echocardiogram on her in 6 months for follow-up visit. [name] not having any symptoms of congestive heart failure. 2. Hypertension - BP is well controlled   3. COPD 4. Diabetes mellitus 5. History colon cancer   Current medicines are reviewed at length with the patient today.  The patient does not have concerns regarding medicines.  The following changes have been made:  no change  Labs/ tests ordered today include:   Orders Placed This Encounter  Procedures  . Lipid Profile  . EKG 12-Lead     Disposition:   FU with me in 6 months  .  Will get an echo in 6 months      Itsel Opfer, Wonda Cheng, MD  11/02/2014 12:12 PM    [address], [place], [place]  [address] Phone: [phone]; Fax: [phone]   [facility]  [address] [place], [place]  [address] [phone]    Fax [phone]

## 2014-11-16 ENCOUNTER — Other Ambulatory Visit: Payer: Self-pay | Admitting: Cardiovascular Disease

## 2014-11-21 ENCOUNTER — Ambulatory Visit (INDEPENDENT_AMBULATORY_CARE_PROVIDER_SITE_OTHER): Payer: 59 | Admitting: Internal Medicine

## 2014-11-21 ENCOUNTER — Encounter: Payer: Self-pay | Admitting: Internal Medicine

## 2014-11-21 VITALS — BP 132/78 | HR 78 | Temp 98.2°F | Resp 18 | Wt 178.0 lb

## 2014-11-21 DIAGNOSIS — E1140 Type 2 diabetes mellitus with diabetic neuropathy, unspecified: Secondary | ICD-10-CM | POA: Diagnosis not present

## 2014-11-21 DIAGNOSIS — E1165 Type 2 diabetes mellitus with hyperglycemia: Secondary | ICD-10-CM

## 2014-11-21 DIAGNOSIS — E049 Nontoxic goiter, unspecified: Secondary | ICD-10-CM | POA: Diagnosis not present

## 2014-11-21 DIAGNOSIS — IMO0002 Reserved for concepts with insufficient information to code with codable children: Secondary | ICD-10-CM

## 2014-11-21 MED ORDER — METFORMIN HCL ER 500 MG PO TB24: 500.0000 mg | ORAL_TABLET | Freq: Two times a day (BID) | ORAL | Status: DC

## 2014-11-21 NOTE — Progress Notes (Signed)
Patient ID: [name], female   DOB: [birth year], 64 y.o.   MRN: [id number]  HPI: [name] is a 64 y.o.-year-old female, returning for f/u DM2, dx in 2006, insulin-dependent, uncontrolled, with complications (CAD - s/p AMI 06/2013, CHF; CKD; PN). Last visit 1 mo ago.  She started to change her diet since last visit. Cut down portions, changed the meal content to include more fiber and less carbs. Drinks a lot of water. Her vision is better!  Last hemoglobin A1c was: Lab Results  Component Value Date   HGBA1C 16.2* 09/21/2014   HGBA1C 12.9* 02/12/2014   HGBA1C 11.8* 10/03/2013  Pt has refused insulin repeatedly in the past.  Pt is now on: - Metformin ER 500 mg 2x a day - started 10/2014 She tried Tradjenta 5 mg daily in am >> CP with it (started 10/2014) >> had to stop - she did see her cardiologist since then She tried [place] >> decreased [name] and yeast inf. She tried regular metformin >> diarrhea  Pt checks her sugars 3-4x a day and they are PERFECT in last 2 weeks: - am: 340-400s >> 76-125 - 2h after b'fast: n/c  - before lunch: n/c >> 79-130, 145 - 2h after lunch: n/c - before dinner: n/c >> 86-122 - 2h after dinner: n/c - bedtime: n/c >> 76-142 - nighttime: n/c No lows. Lowest sugar was 300s >> 76; ? hypoglycemia awareness. Highest sugar was 573 >> 155  Glucometer: One Touch Ultra Mini  Pt's meals are: - Breakfast: oatmeal, cereals, [place] bacon, egg toast - Lunch: sandwich or fruit salad - Dinner: salmon/chicken, Brussel sprouts, other veggies, sweet potatoes or brown rice - Snacks: 2: carrots with dip; yoghurt with fruit, salad at 10 pm  She walks for exercise.  - + CKD, last BUN/creatinine:  Lab Results  Component Value Date   BUN 30* 09/21/2014   CREATININE 1.37* 09/21/2014  On Irbesartan. - last set of lipids: Lab Results  Component Value Date   CHOL 138 11/02/2014   HDL 40.90 11/02/2014   LDLCALC 77 11/02/2014   TRIG 100.0 11/02/2014   CHOLHDL 3 11/02/2014  On Crestor 5 mg daily. - last eye exam was in 2006.  - + numbness and tingling in her feet. Has PN.   She also has a history of hypertension, GERD, and anemia.  ROS: Constitutional: + Weight gain, + cold intolerance, and + hot flashes Eyes: + blurry vision, no xerophthalmia ENT: no sore throat, no nodules palpated in throat, no dysphagia/odynophagia, no hoarseness Cardiovascular: + CP/+ SOB/no palpitations/leg swelling Respiratory: no cough/+ SOB Gastrointestinal: no N/V/D/C/ + acid reflux Musculoskeletal: no muscle/joint aches Skin: no rashes, + hair loss Neurological: no tremors/numbness/tingling/dizziness, + headache   I reviewed pt's medications, allergies, PMH, social hx, family hx, and changes were documented in the history of present illness. Otherwise, unchanged from my initial visit note.  Past Medical History  Diagnosis Date  . Anemia   . Personal history of colon cancer   . Diabetes mellitus   . Hyperlipidemia   . Hypertension   . Low back pain   . Obesity   . COPD (chronic obstructive pulmonary disease)   . Renal insufficiency   . NSTEMI (non-ST elevated myocardial infarction) 06/11/2013  . Ischemic cardiomyopathy 06/11/2013   Past Surgical History  Procedure Laterality Date  . Colectomy    . Left heart catheterization with coronary angiogram N/A 06/09/2013    Procedure: LEFT HEART CATHETERIZATION WITH CORONARY ANGIOGRAM;  Surgeon: Milus Banister  Deboraha Sprang, MD;  Location: [facility] CATH LAB;  Service: Cardiovascular;  Laterality: N/A;   History   Social History  . Marital Status: Single    Spouse Name: N/A  . Number of Children: 0   Occupational History  . retired   Social History Main Topics  . Smoking status:  former smoker     Types: Cigarettes  . Smokeless tobacco: Never Used  . Alcohol Use: No  . Drug Use: No   Current Outpatient Prescriptions on File Prior to Visit  Medication Sig Dispense Refill  . amLODipine (NORVASC) 10 MG tablet  Take 0.5 tablets (5 mg total) by mouth daily. 30 tablet 11  . aspirin 81 MG chewable tablet Chew 1 tablet (81 mg total) by mouth daily.    . calcium carbonate (OS-CAL) 600 MG TABS tablet Take 600 mg by mouth 2 (two) times daily with a meal.    . carvedilol (COREG) 12.5 MG tablet TAKE ONE TABLET BY MOUTH TWICE DAILY WITH MEALS 60 tablet 5  . furosemide (LASIX) 80 MG tablet TAKE ONE-HALF TABLET BY MOUTH ONCE DAILY 45 tablet 0  . irbesartan (AVAPRO) 300 MG tablet Take 1 tablet (300 mg total) by mouth daily. 30 tablet 11  . metFORMIN (GLUCOPHAGE XR) 500 MG 24 hr tablet Take 1 tablet (500 mg total) by mouth daily with breakfast. 60 tablet 1  . nitroGLYCERIN (NITROSTAT) 0.4 MG SL tablet Place 1 tablet (0.4 mg total) under the tongue every 5 (five) minutes as needed for chest pain. 20 tablet 3  . omeprazole (PRILOSEC) 40 MG capsule Take 1 capsule (40 mg total) by mouth daily. 30 capsule 11  . potassium chloride SA (K-DUR,KLOR-CON) 20 MEQ tablet Take 1 tablet (20 mEq total) by mouth daily. 30 tablet 11  . rosuvastatin (CRESTOR) 5 MG tablet Take 1 tablet (5 mg total) by mouth daily at 6 PM. 90 tablet 3  . vitamin B-12 (CYANOCOBALAMIN) 1000 MCG tablet Take 1,000 mcg by mouth daily.     No current facility-administered medications on file prior to visit.   Allergies  Allergen Reactions  . Atorvastatin     REACTION: aches and pains  . Enalapril Maleate     REACTION: cough  . Farxiga [Dapagliflozin] Itching  . Hydrochlorothiazide     REACTION: hair loss  . Kenalog [Triamcinolone Acetonide]     HANDS NUMB   . Metformin And Related     Diarrhea, dizziness  . Propoxyphene N-Acetaminophen Hives  . Simvastatin     REACTION: cramps  . Spironolactone     REACTION: cramps   Family History  Problem Relation Age of Onset  . Hypertension     PE: BP 132/78 mmHg  Pulse 78  Temp(Src) 98.2 F (36.8 C) (Oral)  Resp 18  Wt 178 lb (80.74 kg)  SpO2 98% Wt Readings from Last 3 Encounters:  11/21/14  178 lb (80.74 kg)  11/02/14 165 lb 6.4 oz (75.025 kg)  10/19/14 165 lb 12.8 oz (75.206 kg)   Constitutional: overweight, in NAD Eyes: PERRLA, EOMI, no exophthalmos ENT: moist mucous membranes, + symmetric thyromegaly, no cervical lymphadenopathy Cardiovascular: RRR, No MRG Respiratory: CTA B Gastrointestinal: abdomen soft, NT, ND, BS+ Musculoskeletal: no deformities, strength intact in all 4 Skin: moist, warm, no rashes Neurological: no tremor with outstretched hands, DTR normal in all 4  ASSESSMENT: 1. DM2, insulin-dependent (refusing insulin), uncontrolled, with complications - CAD, s/p AMI 06/2013 - Dr Acie Fredrickson - CHF - CKD - PN  2. Goiter  10/19/2014:  thyroid ultrasound: Right thyroid lobe: 4.0 x 1.5 x 1.6 cm. Heterogeneous parenchyma with multiple small cysts identified. The largest measures approximately 0.5 x 0.3 x 0.5 cm. All of the small cysts in the right lobe appears simple and benign.  Left thyroid lobe: 4.5 x 1.4 x 2.0 cm. Heterogeneous parenchyma with multiple cysts. The largest measures approximately 1.0 x 0.4 x 0.5 cm. 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 Similar smaller cyst measures 0.8 cm in greatest diameter. There is a small solid nodule measuring 0.7 x 0.4 x 0.6 cm.  Isthmus Thickness: 0.4 cm. No nodules visualized.  Lymphadenopathy: None visualized.          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 No intervention needed.  PLAN:  1. Patient with long-standing, uncontrolled diabetes, not on any antidiabetic meds, with extremely high sugars at last visit and a hemoglobin A1c of 16. She adamantly refused insulin, however, after we demonstrated the injection last time, she mentions that he was not as bad as she thought. We decided at last visit to try to start metformin 500 mg twice a day and add Tradjenta to see how this will work. At this visit, she tells me that she could not tolerate Tradjenta because of chest pain. She did however  tolerate the metformin 500 mg twice a day and she brings a good sugar log, showing checks 4 times a day. The CBGs have dramatically decreased since last visit, to the point of being almost all at goal in the last week!!! I have not expected this effect! She definitely does not need insulin for now. We can continue only with metformin and continue her current diet. - I suggested to:  Patient Instructions  Please continue Metformin XR 500 mg 2x a day.  Schedule your eye exam.  Please come back for a follow-up appointment in 3 months.  - continue checking sugars at different times of the day - check 2 times a day, rotating checks - advised for yearly eye exams - I strongly advised her to schedule one. - It is not time for the hemoglobin A1c yet, she would like to get this at the next appointment with PCP - Return to clinic in 3 mo with sugar log   2. Goiter - Patient has a goiter at palpation - No neck compression symptoms - we obtained a thyroid ultrasound >> we reviewed this together today >> multiple small cysts >> no further investigation necessary  - Reviewed latest TSH level: Lab Results  Component Value Date   TSH 2.19 10/03/2013

## 2014-11-21 NOTE — Patient Instructions (Signed)
Please continue Metformin XR 500 mg 2x a day.  Schedule your eye exam.  Please come back for a follow-up appointment in 3 months.

## 2014-12-21 ENCOUNTER — Telehealth: Payer: Self-pay | Admitting: Internal Medicine

## 2014-12-21 ENCOUNTER — Other Ambulatory Visit (INDEPENDENT_AMBULATORY_CARE_PROVIDER_SITE_OTHER): Payer: 59

## 2014-12-21 ENCOUNTER — Encounter: Payer: Self-pay | Admitting: Internal Medicine

## 2014-12-21 ENCOUNTER — Ambulatory Visit (INDEPENDENT_AMBULATORY_CARE_PROVIDER_SITE_OTHER): Payer: 59 | Admitting: Internal Medicine

## 2014-12-21 VITALS — BP 122/70 | HR 84 | Wt 169.0 lb

## 2014-12-21 DIAGNOSIS — E1165 Type 2 diabetes mellitus with hyperglycemia: Secondary | ICD-10-CM | POA: Diagnosis not present

## 2014-12-21 DIAGNOSIS — I2583 Coronary atherosclerosis due to lipid rich plaque: Principal | ICD-10-CM

## 2014-12-21 DIAGNOSIS — I2510 Atherosclerotic heart disease of native coronary artery without angina pectoris: Secondary | ICD-10-CM

## 2014-12-21 DIAGNOSIS — IMO0002 Reserved for concepts with insufficient information to code with codable children: Secondary | ICD-10-CM

## 2014-12-21 DIAGNOSIS — K2100 Gastro-esophageal reflux disease with esophagitis, without bleeding: Secondary | ICD-10-CM

## 2014-12-21 DIAGNOSIS — E1140 Type 2 diabetes mellitus with diabetic neuropathy, unspecified: Secondary | ICD-10-CM | POA: Diagnosis not present

## 2014-12-21 DIAGNOSIS — M5440 Lumbago with sciatica, unspecified side: Secondary | ICD-10-CM | POA: Diagnosis not present

## 2014-12-21 DIAGNOSIS — N259 Disorder resulting from impaired renal tubular function, unspecified: Secondary | ICD-10-CM

## 2014-12-21 DIAGNOSIS — R634 Abnormal weight loss: Secondary | ICD-10-CM

## 2014-12-21 LAB — BASIC METABOLIC PANEL
BUN: 39 mg/dL — ABNORMAL HIGH (ref 6–23)
CO2: 24 mEq/L (ref 19–32)
Calcium: 9.7 mg/dL (ref 8.4–10.5)
Chloride: 101 mEq/L (ref 96–112)
Creatinine, Ser: 1.48 mg/dL — ABNORMAL HIGH (ref 0.40–1.20)
GFR: 37.71 mL/min — ABNORMAL LOW (ref >60.00)
Glucose, Bld: 312 mg/dL — ABNORMAL HIGH (ref 70–99)
Potassium: 4.8 mEq/L (ref 3.5–5.1)
Sodium: 131 mEq/L — ABNORMAL LOW (ref 135–145)

## 2014-12-21 LAB — HEMOGLOBIN A1C: Hgb A1c MFr Bld: 11.7 % — ABNORMAL HIGH (ref 4.6–6.5)

## 2014-12-21 MED ORDER — FUROSEMIDE 80 MG PO TABS: 40.0000 mg | ORAL_TABLET | Freq: Every day | ORAL | Status: DC

## 2014-12-21 MED ORDER — AMLODIPINE BESYLATE 10 MG PO TABS: 5.0000 mg | ORAL_TABLET | Freq: Every day | ORAL | Status: DC

## 2014-12-21 MED ORDER — RANITIDINE HCL 150 MG PO TABS: 150.0000 mg | ORAL_TABLET | Freq: Two times a day (BID) | ORAL | Status: DC

## 2014-12-21 MED ORDER — ROSUVASTATIN CALCIUM 5 MG PO TABS: 5.0000 mg | ORAL_TABLET | Freq: Every day | ORAL | Status: DC

## 2014-12-21 MED ORDER — IRBESARTAN 300 MG PO TABS: 300.0000 mg | ORAL_TABLET | Freq: Every day | ORAL | Status: DC

## 2014-12-21 NOTE — Progress Notes (Signed)
Subjective:      HPI  The patient presents for a follow-up of  chronic hypertension, chronic dyslipidemia, type 2 diabetes, CHF controlled with medicines C/o dysphagia when eating burgers, cabbage. Pt stopped Prilosec 2 mo ago after TV adds  BP Readings from Last 3 Encounters:  12/21/14 122/70  11/21/14 132/78  11/02/14 120/66   Wt Readings from Last 3 Encounters:  12/21/14 169 lb (76.658 kg)  11/21/14 178 lb (80.74 kg)  11/02/14 165 lb 6.4 oz (75.025 kg)      Review of Systems  Constitutional: Negative for chills, activity change, appetite change, fatigue and unexpected weight change.  HENT: Negative for congestion, mouth sores and sinus pressure.   Eyes: Negative for visual disturbance.  Respiratory: Negative for cough and chest tightness.   Gastrointestinal: Negative for nausea and abdominal pain.  Genitourinary: Negative for frequency, difficulty urinating and vaginal pain.  Musculoskeletal: Negative for back pain and gait problem.  Skin: Negative for pallor and rash.  Neurological: Negative for dizziness, tremors, weakness, numbness and headaches.  Psychiatric/Behavioral: Negative for confusion and sleep disturbance.       Objective:   Physical Exam  Constitutional: She appears well-developed. No distress.  HENT:  Head: Normocephalic.  Right Ear: External ear normal.  Left Ear: External ear normal.  Nose: Nose normal.  Mouth/Throat: Oropharynx is clear and moist.  Eyes: Conjunctivae are normal. Pupils are equal, round, and reactive to light. Right eye exhibits no discharge. Left eye exhibits no discharge.  Neck: Normal range of motion. Neck supple. No JVD present. No tracheal deviation present. No thyromegaly present.  Cardiovascular: Normal rate, regular rhythm and normal heart sounds.   Pulmonary/Chest: No stridor. No respiratory distress. She has no wheezes.  Abdominal: Soft. Bowel sounds are normal. She exhibits no distension and no mass. There is no  tenderness. There is no rebound and no guarding.  Musculoskeletal: She exhibits no edema or tenderness.  Lymphadenopathy:    She has no cervical adenopathy.  Neurological: She displays normal reflexes. No cranial nerve deficit. She exhibits normal muscle tone. Coordination normal.  Skin: No rash noted. No erythema.  Psychiatric: She has a normal mood and affect. Her behavior is normal. Judgment and thought content normal.   Lab Results  Component Value Date   WBC 4.3 09/21/2014   HGB 11.9* 09/21/2014   HCT 34.3* 09/21/2014   PLT 131.0* 09/21/2014   GLUCOSE 573* 09/21/2014   CHOL 138 11/02/2014   TRIG 100.0 11/02/2014   HDL 40.90 11/02/2014   LDLCALC 77 11/02/2014   ALT 11 09/21/2014   AST 10 09/21/2014   NA 128* 09/21/2014   K 4.6 09/21/2014   CL 96 09/21/2014   CREATININE 1.37* 09/21/2014   BUN 30* 09/21/2014   CO2 29 09/21/2014   TSH 2.19 10/03/2013   INR 1.16 06/07/2013   HGBA1C 16.2* 09/21/2014   MICROALBUR 13.9* 10/13/2007       Assessment:         Plan:

## 2014-12-21 NOTE — Assessment & Plan Note (Addendum)
On Metformin, Crestor

## 2014-12-21 NOTE — Assessment & Plan Note (Signed)
Chronic  6/16 dysphagia off Prilosec.  Pt stopped Prilosec 2 mo ago after TV adds Start Ranitidine bid

## 2014-12-21 NOTE — Assessment & Plan Note (Signed)
Tylenol prn 

## 2014-12-21 NOTE — Telephone Encounter (Signed)
Patient received a call from [place] today. Did you call her?

## 2014-12-21 NOTE — Progress Notes (Signed)
Pre visit review using our clinic review tool, if applicable. No additional management support is needed unless otherwise documented below in the visit note. 

## 2014-12-21 NOTE — Assessment & Plan Note (Signed)
3.0 x 15 Alpine stent to mid LAD Dr Acie Fredrickson Aspirin, Crestor

## 2014-12-21 NOTE — Assessment & Plan Note (Signed)
  On diet  

## 2014-12-21 NOTE — Assessment & Plan Note (Signed)
Monitor labs 

## 2014-12-25 NOTE — Telephone Encounter (Signed)
Pt has spoken with Jonelle Sidle concerning her labs results. Closing encounter...Johny Chess

## 2015-01-22 ENCOUNTER — Telehealth: Payer: Self-pay | Admitting: Geriatric Medicine

## 2015-01-22 NOTE — Telephone Encounter (Signed)
Left message asking patient to call me back. I need to know if she has had a mammogram recently.

## 2015-02-12 ENCOUNTER — Other Ambulatory Visit: Payer: Self-pay | Admitting: Cardiovascular Disease

## 2015-02-21 ENCOUNTER — Ambulatory Visit: Payer: 59 | Admitting: Internal Medicine

## 2015-03-19 ENCOUNTER — Other Ambulatory Visit (INDEPENDENT_AMBULATORY_CARE_PROVIDER_SITE_OTHER): Payer: 59 | Admitting: *Deleted

## 2015-03-19 ENCOUNTER — Encounter: Payer: Self-pay | Admitting: Internal Medicine

## 2015-03-19 ENCOUNTER — Ambulatory Visit (INDEPENDENT_AMBULATORY_CARE_PROVIDER_SITE_OTHER): Payer: 59 | Admitting: Internal Medicine

## 2015-03-19 ENCOUNTER — Other Ambulatory Visit: Payer: Self-pay | Admitting: *Deleted

## 2015-03-19 DIAGNOSIS — IMO0002 Reserved for concepts with insufficient information to code with codable children: Secondary | ICD-10-CM

## 2015-03-19 DIAGNOSIS — E1140 Type 2 diabetes mellitus with diabetic neuropathy, unspecified: Secondary | ICD-10-CM

## 2015-03-19 DIAGNOSIS — E1165 Type 2 diabetes mellitus with hyperglycemia: Secondary | ICD-10-CM

## 2015-03-19 LAB — POCT GLYCOSYLATED HEMOGLOBIN (HGB A1C): Hemoglobin A1C: 10.1

## 2015-03-19 MED ORDER — GLUCOSE BLOOD VI STRP
ORAL_STRIP | Status: DC
Start: 2015-03-19 — End: 2015-10-17

## 2015-03-19 MED ORDER — GLUCOSE BLOOD VI STRP: ORAL_STRIP | Status: DC

## 2015-03-19 MED ORDER — METFORMIN HCL ER 500 MG PO TB24: 500.0000 mg | ORAL_TABLET | Freq: Two times a day (BID) | ORAL | Status: DC

## 2015-03-19 MED ORDER — GLIPIZIDE ER 5 MG PO TB24: 5.0000 mg | ORAL_TABLET | Freq: Every day | ORAL | Status: DC

## 2015-03-19 NOTE — Patient Instructions (Signed)
Please continue Metformin XR 500 mg 2x a day. Start Glipizide ER 5 mg before breakfast.  Please come back for a follow-up appointment in 3 months.

## 2015-03-19 NOTE — Progress Notes (Signed)
Patient ID: [name], female   DOB: 1951-03-16, 64 y.o.   MRN: [id number]  HPI: [name] is a 64 y.o.-year-old female, returning for f/u DM2, dx in 2006, insulin-dependent, uncontrolled, with complications (CAD - s/p AMI 06/2013, CHF; CKD; PN). Last visit 4 mo ago.  She started to change her diet after her hemoglobin A1c returned high, at 16.2% in 09/2014. She cut down portions, changed the meal content to include more fiber and less carbs. Drinks a lot of water. Ate more fruit this summer >> sugars a little higher.   At last check, her hemoglobin A1c was drastically improved.   Last hemoglobin A1c was: Lab Results  Component Value Date   HGBA1C 11.7* 12/21/2014   HGBA1C 16.2* 09/21/2014   HGBA1C 12.9* 02/12/2014  Pt has refused insulin repeatedly in the past.  Pt is now on: - Metformin ER 500 mg 2x a day - started 10/2014 She tried Tradjenta 5 mg daily in am >> CP with it (started 10/2014) >> had to stop - she did see her cardiologist since then She tried [place] >> decreased [name] and yeast inf. She tried regular metformin >> diarrhea  Pt checks her sugars 2x a day and they are higher: - am: 340-400s >> 76-125 >> 98-140, 160, 201 - 2h after b'fast: n/c  - before lunch: n/c >> 79-130, 145 >> n/c - 2h after lunch: n/c - before dinner: n/c >> 86-122 >> n/c - 2h after dinner: n/c - bedtime: n/c >> 76-142 >> 90, 122-180, 199 - nighttime: n/c No lows. Lowest sugar was 300s >> 76 >> 90; ? hypoglycemia awareness. Highest sugar was 573 >> 155 >> 199  Glucometer: One Touch Ultra  Pt's meals are: - Breakfast: oatmeal, cereals, [place] bacon, egg toast - Lunch: sandwich or fruit salad - Dinner: salmon/chicken, Brussel sprouts, other veggies, sweet potatoes or brown rice - Snacks: 2: carrots with dip; yoghurt with fruit, salad at 10 pm  She walks for exercise.  - + CKD, last BUN/creatinine:  Lab Results  Component Value Date   BUN 39* 12/21/2014   CREATININE 1.48*  12/21/2014  On Irbesartan. - last set of lipids: Lab Results  Component Value Date   CHOL 138 11/02/2014   HDL 40.90 11/02/2014   LDLCALC 77 11/02/2014   TRIG 100.0 11/02/2014   CHOLHDL 3 11/02/2014  On Crestor 5 mg daily. - last eye exam was in 02/2015. No DR. + cataracts.  - + numbness and tingling in her feet. Has PN.   She also has a history of hypertension, GERD, and anemia.  ROS: Constitutional: + Weight loss, + heat intolerance, + hot flashes Eyes: + blurry vision, no xerophthalmia ENT: no sore throat, no nodules palpated in throat, no dysphagia/odynophagia, no hoarseness Cardiovascular: + CP/+ SOB/no palpitations/+ leg swelling Respiratory: no cough/+ SOB Gastrointestinal: no N/V/+ D/no C/ + acid reflux Musculoskeletal: + muscle/jno joint aches Skin: no rashes, + hair loss, + itching Neurological: no tremors/numbness/tingling/dizziness, + headache   I reviewed pt's medications, allergies, PMH, social hx, family hx, and changes were documented in the history of present illness. Otherwise, unchanged from my initial visit note.  Past Medical History  Diagnosis Date  . Anemia   . Personal history of colon cancer   . Diabetes mellitus   . Hyperlipidemia   . Hypertension   . Low back pain   . Obesity   . COPD (chronic obstructive pulmonary disease)   . Renal insufficiency   . NSTEMI (non-ST  elevated myocardial infarction) 06/11/2013  . Ischemic cardiomyopathy 06/11/2013   Past Surgical History  Procedure Laterality Date  . Colectomy    . Left heart catheterization with coronary angiogram N/A 06/09/2013    Procedure: LEFT HEART CATHETERIZATION WITH CORONARY ANGIOGRAM;  Surgeon: Ramond Dial, MD;  Location: [facility] CATH LAB;  Service: Cardiovascular;  Laterality: N/A;   History   Social History  . Marital Status: Single    Spouse Name: N/A  . Number of Children: 0   Occupational History  . retired   Social History Main Topics  . Smoking status:  former smoker      Types: Cigarettes  . Smokeless tobacco: Never Used  . Alcohol Use: No  . Drug Use: No   Current Outpatient Prescriptions on File Prior to Visit  Medication Sig Dispense Refill  . amLODipine (NORVASC) 10 MG tablet Take 0.5 tablets (5 mg total) by mouth daily. 30 tablet 11  . aspirin 81 MG chewable tablet Chew 1 tablet (81 mg total) by mouth daily.    . calcium carbonate (OS-CAL) 600 MG TABS tablet Take 600 mg by mouth 2 (two) times daily with a meal.    . carvedilol (COREG) 12.5 MG tablet TAKE ONE TABLET BY MOUTH TWICE DAILY WITH MEALS 60 tablet 5  . furosemide (LASIX) 80 MG tablet Take 0.5 tablets (40 mg total) by mouth daily. 45 tablet 1  . irbesartan (AVAPRO) 300 MG tablet Take 1 tablet (300 mg total) by mouth daily. 30 tablet 11  . KLOR-CON M20 20 MEQ tablet TAKE ONE TABLET BY MOUTH ONCE DAILY 30 tablet 11  . metFORMIN (GLUCOPHAGE-XR) 500 MG 24 hr tablet Take 1 tablet by mouth 2 (two) times daily.    . nitroGLYCERIN (NITROSTAT) 0.4 MG SL tablet Place 1 tablet (0.4 mg total) under the tongue every 5 (five) minutes as needed for chest pain. 20 tablet 3  . ranitidine (ZANTAC) 150 MG tablet Take 1 tablet (150 mg total) by mouth 2 (two) times daily. 60 tablet 11  . rosuvastatin (CRESTOR) 5 MG tablet Take 1 tablet (5 mg total) by mouth daily at 6 PM. 30 tablet 11  . vitamin B-12 (CYANOCOBALAMIN) 1000 MCG tablet Take 1,000 mcg by mouth daily.     No current facility-administered medications on file prior to visit.   Allergies  Allergen Reactions  . Tradjenta [Linagliptin] Anaphylaxis    CP  . Atorvastatin     REACTION: aches and pains  . Enalapril Maleate     REACTION: cough  . Farxiga [Dapagliflozin] Itching  . Hydrochlorothiazide     REACTION: hair loss  . Kenalog [Triamcinolone Acetonide]     HANDS NUMB   . Metformin And Related     Diarrhea, dizziness  . Propoxyphene N-Acetaminophen Hives  . Simvastatin     REACTION: cramps  . Spironolactone     REACTION: cramps    Family History  Problem Relation Age of Onset  . Hypertension     PE: BP 128/64 mmHg  Pulse 80  Temp(Src) 98.2 F (36.8 C) (Oral)  Resp 12  Wt 174 lb (78.926 kg)  SpO2 97% Body mass index is 31.82 kg/(m^2). Wt Readings from Last 3 Encounters:  03/19/15 174 lb (78.926 kg)  12/21/14 169 lb (76.658 kg)  11/21/14 178 lb (80.74 kg)   Constitutional: overweight, in NAD Eyes: PERRLA, EOMI, no exophthalmos ENT: moist mucous membranes, + symmetric thyromegaly, no cervical lymphadenopathy Cardiovascular: RRR, No MRG Respiratory: CTA B Gastrointestinal: abdomen soft, NT,  ND, BS+ Musculoskeletal: no deformities, strength intact in all 4 Skin: moist, warm, no rashes Neurological: no tremor with outstretched hands, DTR normal in all 4  ASSESSMENT: 1. DM2, insulin-dependent (refusing insulin), uncontrolled, with complications - CAD, s/p AMI 06/2013 - Dr Acie Fredrickson - CHF - CKD - PN  2. Goiter  2. Goiter - Reviewed latest TSH level: Lab Results  Component Value Date   TSH 2.19 10/03/2013  - Patient has a goiter at palpation - No neck compression symptoms - we obtained a thyroid ultrasound >> multiple small cysts >> no further investigation necessary    10/19/2014: thyroid ultrasound: Right thyroid lobe: 4.0 x 1.5 x 1.6 cm. Heterogeneous parenchyma with multiple small cysts identified. The largest measures approximately 0.5 x 0.3 x 0.5 cm. All of the small cysts in the right lobe appears simple and benign.  Left thyroid lobe: 4.5 x 1.4 x 2.0 cm. Heterogeneous parenchyma with multiple cysts. The largest measures approximately 1.0 x 0.4 x 0.5 cm. 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 Similar smaller cyst measures 0.8 cm in greatest diameter. There is a small solid nodule measuring 0.7 x 0.4 x 0.6 cm.  Isthmus Thickness: 0.4 cm. No nodules visualized.  Lymphadenopathy: None visualized.          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 No intervention  needed.  PLAN:  1. Patient with long-standing, uncontrolled diabetes, on Metformin, with initially extremely high sugars, now improved. She refused insulin in the past but has done well after starting Metformin. Sugars are higher as she had more fruit this summer >> will add Glipizide ER in am and she will continue to work on there diet. - I suggested to:  Patient Instructions  Please continue Metformin XR 500 mg 2x a day. Start Glipizide ER 5 mg before breakfast.  Please come back for a follow-up appointment in 3 months.  - continue checking sugars at different times of the day - check 2 times a day, rotating checks - advised for yearly eye exams - UTD - check hemoglobin A1c >> 10.1% - Return to clinic in 3 mo with sugar log   2. Goiter - No neck compression symptoms

## 2015-03-28 LAB — HM DIABETES EYE EXAM

## 2015-04-11 ENCOUNTER — Encounter (INDEPENDENT_AMBULATORY_CARE_PROVIDER_SITE_OTHER): Payer: Self-pay | Admitting: Ophthalmology

## 2015-04-15 ENCOUNTER — Encounter: Payer: Self-pay | Admitting: Internal Medicine

## 2015-04-23 ENCOUNTER — Ambulatory Visit: Payer: 59 | Admitting: Internal Medicine

## 2015-04-24 ENCOUNTER — Ambulatory Visit (HOSPITAL_COMMUNITY): Payer: 59 | Attending: Cardiology

## 2015-04-24 ENCOUNTER — Other Ambulatory Visit: Payer: Self-pay

## 2015-04-24 ENCOUNTER — Other Ambulatory Visit: Payer: Self-pay | Admitting: Cardiovascular Disease

## 2015-04-24 DIAGNOSIS — I517 Cardiomegaly: Secondary | ICD-10-CM | POA: Insufficient documentation

## 2015-04-24 DIAGNOSIS — E119 Type 2 diabetes mellitus without complications: Secondary | ICD-10-CM | POA: Diagnosis not present

## 2015-04-24 DIAGNOSIS — I5022 Chronic systolic (congestive) heart failure: Secondary | ICD-10-CM | POA: Diagnosis not present

## 2015-04-24 DIAGNOSIS — E785 Hyperlipidemia, unspecified: Secondary | ICD-10-CM | POA: Insufficient documentation

## 2015-04-24 DIAGNOSIS — I10 Essential (primary) hypertension: Secondary | ICD-10-CM | POA: Insufficient documentation

## 2015-05-01 ENCOUNTER — Encounter: Payer: Self-pay | Admitting: Cardiovascular Disease

## 2015-05-01 ENCOUNTER — Ambulatory Visit (INDEPENDENT_AMBULATORY_CARE_PROVIDER_SITE_OTHER): Payer: 59 | Admitting: Cardiovascular Disease

## 2015-05-01 VITALS — BP 150/80 | HR 87 | Ht 62.0 in | Wt 173.8 lb

## 2015-05-01 DIAGNOSIS — I5022 Chronic systolic (congestive) heart failure: Secondary | ICD-10-CM

## 2015-05-01 DIAGNOSIS — E785 Hyperlipidemia, unspecified: Secondary | ICD-10-CM | POA: Diagnosis not present

## 2015-05-01 DIAGNOSIS — I2510 Atherosclerotic heart disease of native coronary artery without angina pectoris: Secondary | ICD-10-CM | POA: Diagnosis not present

## 2015-05-01 DIAGNOSIS — I2583 Coronary atherosclerosis due to lipid rich plaque: Secondary | ICD-10-CM

## 2015-05-01 LAB — BASIC METABOLIC PANEL
BUN: 31 mg/dL — ABNORMAL HIGH (ref 7–25)
CO2: 24 mmol/L (ref 20–31)
Calcium: 9.7 mg/dL (ref 8.6–10.4)
Chloride: 108 mmol/L (ref 98–110)
Creat: 1.62 mg/dL — ABNORMAL HIGH (ref 0.50–0.99)
Glucose, Bld: 189 mg/dL — ABNORMAL HIGH (ref 65–99)
Potassium: 4.6 mmol/L (ref 3.5–5.3)
Sodium: 139 mmol/L (ref 135–146)

## 2015-05-01 MED ORDER — CARVEDILOL 25 MG PO TABS: 25.0000 mg | ORAL_TABLET | Freq: Two times a day (BID) | ORAL | Status: DC

## 2015-05-01 NOTE — Patient Instructions (Signed)
Medication Instructions:  INCREASE Coreg (Carvedilol) to 25 mg twice daily   Labwork: TODAY - basic metabolic panel    Testing/Procedures: None Ordered   Follow-Up: Your physician wants you to follow-up in: 6 months with Dr. Acie Fredrickson.  You will receive a reminder letter in the mail two months in advance. If you don't receive a letter, please call our office to schedule the follow-up appointment.   If you need a refill on your cardiac medications before your next appointment, please call your pharmacy.

## 2015-05-01 NOTE — Progress Notes (Addendum)
Cardiology Office Note   Date:  05/01/2015   ID:  [name], DOB [birth year], MRN [id number]  PCP:  Walker Kehr, MD  Cardiologist:   Thayer Headings, MD   Chief Complaint  Patient presents with  . Follow-up    CHF   1. Coronary artery disease-status post PTCA and stenting of her mid LAD using a 3.5 x 15 mm Alpine stent ( DES) ( Dec. 5, 2014)  2. chronic systolic congestive heart failure-ejection fraction of 35% 2. Hypertension 3. COPD 4. Diabetes mellitus 5. History colon cancer  History of Present Illness:  [name] is seen today. She was initially seen by Dr. Mare Ferrari in the hospital. She has a history of chronic systolic congestive heart failure with an ejection fraction of around 35%. She has a history of coronary artery disease. She is status post PTCA and stenting of her mid left anterior descending artery using a 3.5 x 15 mm Alpine stent. She has a history of COPD. [name] not had a cigarette since he left the hospital. She also has a history of diabetes mellitus and history of colon cancer  She has mild interscapular pain this morning . And the pain resolved after she drank some club soda. Did not feel like her MI pain.  She has been walking every morining at the mall and at [place].   Her BP is up today. - she may have had lots of salt last night. The BP has remained high   September 20, 2013  [name] has been having some episodes of chest discomfort. She initially thought it might be due to indigestion or due to something that she ate. It radiated to her interscapular region. It was associated with some hand and normal tingling. The pain was intermittent but lasted for several hours. She took a sublingual nitroglycerin and the pain resolved very quickly and she did not have any further episodes the whole rest of the day.  She informed that she ran out of her Lasix 3 days ago.  November 01, 2013:  [name] is feeling well. She stopped smoking the  day of her cath. She is eating more "penny candy" to replace the habit.   January 31, 2014:  She is doing ok. She has no angina. Not exercising. 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  She has a bruise on her leg where she bumped into a table.   Oct. 29, 2015:  [name] is doing very well. She is not having any episodes of chest pain. [name] not exercising quite as much as she would like. Stent was placed in December of 2014. She had a nose bleed last week. ( she is on Effient until Dec. 2015). Her Bp was A bit elevated because of some salt in her diet.  No CP or dyspnea    November 02, 2014:  [name] is a 64 y.o. female who presents for follow-up of her coronary artery disease. [name] done fairly well. She has had some muscle pain and a rash. She has occasional episodes of dizziness. She had some non-cardiac CP several weeks ago when she started Trajenta.  Stopped the med and has not had any further episodes of CP.    Oct. 26, 2016  [name] is doing well She had a echo recently   Left ventricle: The cavity size was normal. Wall thickness was increased in a pattern of mild LVH with focal basal septal hypertrophy. Systolic function was normal. The estimated ejection fraction  was in the range of 60% to 65%. Wall motion was normal; there were no regional wall motion abnormalities. Doppler parameters are consistent with abnormal left ventricular relaxation (grade 1 diastolic dysfunction). - Aortic valve: There was no stenosis. There was trivial regurgitation. - Mitral valve: Mildly calcified annulus. Mildly calcified leaflets . There was no significant regurgitation. - Right ventricle: The cavity size was normal. Systolic function was normal. - Tricuspid valve: Peak RV-RA gradient (S): 21 mm Hg. - Pulmonary arteries: PA peak pressure: 24 mm Hg (S). - Inferior vena cava: The vessel was normal in size. The respirophasic diameter changes were in the normal  range (>= 50%), consistent with normal central venous pressure  [name] is doing well  BP has been elevated for the past 2 weeks.  Has had some left arm pain / ached Left arm aches all the time,  Constant.  Worse with sitting, not worsened by exercise .   Past Medical History  Diagnosis Date  . Anemia   . Personal history of colon cancer   . Diabetes mellitus   . Hyperlipidemia   . Hypertension   . Low back pain   . Obesity   . COPD (chronic obstructive pulmonary disease) ([place])   . Renal insufficiency   . NSTEMI (non-ST elevated myocardial infarction) ([place]) 06/11/2013  . Ischemic cardiomyopathy 06/11/2013    Past Surgical History  Procedure Laterality Date  . Colectomy    . Left heart catheterization with coronary angiogram N/A 06/09/2013    Procedure: LEFT HEART CATHETERIZATION WITH CORONARY ANGIOGRAM;  Surgeon: Ramond Dial, MD;  Location: [facility] CATH LAB;  Service: Cardiovascular;  Laterality: N/A;     Current Outpatient Prescriptions  Medication Sig Dispense Refill  . amLODipine (NORVASC) 10 MG tablet Take 0.5 tablets (5 mg total) by mouth daily. 30 tablet 11  . aspirin 81 MG chewable tablet Chew 1 tablet (81 mg total) by mouth daily.    . calcium carbonate (OS-CAL) 600 MG TABS tablet Take 600 mg by mouth 2 (two) times daily with a meal.    . carvedilol (COREG) 12.5 MG tablet TAKE ONE TABLET BY MOUTH TWICE DAILY WITH MEALS 60 tablet 5  . furosemide (LASIX) 80 MG tablet Take 0.5 tablets (40 mg total) by mouth daily. 45 tablet 1  . glipiZIDE (GLUCOTROL XL) 5 MG 24 hr tablet Take 1 tablet (5 mg total) by mouth daily with breakfast. 90 tablet 1  . glucose blood (ONE TOUCH ULTRA TEST) test strip Use to test blood sugar 2 times daily as instructed. Dx: E11.40 75 each 11  . irbesartan (AVAPRO) 300 MG tablet Take 1 tablet (300 mg total) by mouth daily. 30 tablet 11  . KLOR-CON M20 20 MEQ tablet TAKE ONE TABLET BY MOUTH ONCE DAILY 30 tablet 11  . metFORMIN (GLUCOPHAGE-XR) 500 MG  24 hr tablet Take 1 tablet (500 mg total) by mouth 2 (two) times daily. 180 tablet 1  . nitroGLYCERIN (NITROSTAT) 0.4 MG SL tablet Place 1 tablet (0.4 mg total) under the tongue every 5 (five) minutes as needed for chest pain. 20 tablet 3  . ranitidine (ZANTAC) 150 MG tablet Take 1 tablet (150 mg total) by mouth 2 (two) times daily. 60 tablet 11  . rosuvastatin (CRESTOR) 5 MG tablet Take 1 tablet (5 mg total) by mouth daily at 6 PM. 30 tablet 11  . vitamin B-12 (CYANOCOBALAMIN) 1000 MCG tablet Take 1,000 mcg by mouth daily.     No current facility-administered medications for this  visit.    Allergies:   Tradjenta; Atorvastatin; Enalapril maleate; [place]; Hydrochlorothiazide; Kenalog; Metformin and related; Propoxyphene n-acetaminophen; Simvastatin; and Spironolactone    Social History:  The patient  reports that she has been smoking Cigarettes.  She has never used smokeless tobacco. She reports that she does not drink alcohol or use illicit drugs.   Family History:  The patient's family history includes Hypertension in an other family member.    ROS:  Please see the history of present illness.    Review of Systems: Constitutional:  denies fever, chills, diaphoresis, appetite change and fatigue.  HEENT: denies photophobia, eye pain, redness, hearing loss, ear pain, congestion, sore throat, rhinorrhea, sneezing, neck pain, neck stiffness and tinnitus.  Respiratory: denies SOB, DOE, cough, chest tightness, and wheezing.  Cardiovascular: denies chest pain, palpitations and leg swelling.  Gastrointestinal: denies nausea, vomiting, abdominal pain, diarrhea, constipation, blood in stool.  Genitourinary: denies dysuria, urgency, frequency, hematuria, flank pain and difficulty urinating.  Musculoskeletal: denies  myalgias, back pain, joint swelling, arthralgias and gait problem.   Skin: denies pallor, rash and wound.  Neurological: denies dizziness, seizures, syncope, weakness, light-headedness,  numbness and headaches.   Hematological: denies adenopathy, easy bruising, personal or family bleeding history.  Psychiatric/ Behavioral: denies suicidal ideation, mood changes, confusion, nervousness, sleep disturbance and agitation.       All other systems are reviewed and negative.    PHYSICAL EXAM: VS:  BP 150/80 mmHg  Pulse 87  Ht 5\' 2"  (1.575 m)  Wt 173 lb 12.8 oz (78.835 kg)  BMI 31.78 kg/m2  SpO2 95% , BMI Body mass index is 31.78 kg/(m^2). GEN: Well nourished, well developed, in no acute distress HEENT: normal,  Mild thyromegaly  Neck: no JVD, carotid bruits, or masses Cardiac: RRR; no murmurs, rubs, or gallops,no edema  Respiratory:  clear to auscultation bilaterally, normal work of breathing GI: soft, nontender, nondistended, + BS MS: no deformity or atrophy Skin: warm and dry, no rash Neuro:  Strength and sensation are intact Psych: normal   EKG:  EKG is ordered today. The ekg ordered today demonstrates NSR at 69.  NS ST ab.    Recent Labs: 09/21/2014: ALT 11; Hemoglobin 11.9*; Platelets 131.0* 12/21/2014: BUN 39*; Creatinine, Ser 1.48*; Potassium 4.8; Sodium 131*    Lipid Panel    Component Value Date/Time   CHOL 138 11/02/2014 1221   TRIG 100.0 11/02/2014 1221   HDL 40.90 11/02/2014 1221   CHOLHDL 3 11/02/2014 1221   VLDL 20.0 11/02/2014 1221   LDLCALC 77 11/02/2014 1221      Wt Readings from Last 3 Encounters:  05/01/15 173 lb 12.8 oz (78.835 kg)  03/19/15 174 lb (78.926 kg)  12/21/14 169 lb (76.658 kg)      Other studies Reviewed: Additional studies/ records that were reviewed today include: . Review of the above records demonstrates:    ASSESSMENT AND PLAN:  1. Coronary artery disease-status post PTCA and stenting of her mid LAD using a 3.5 x 15 mm Alpine stent ( DES) ( Dec. 5, 2014) she's not had any episodes of angina. We will continue aggressive treatment of her lipids. Continue other medications.   2. chronic systolic congestive  heart failure-ejection fraction of 35% - she's currently on carvedilol and Avapro.  Her echo shows normal LV function.   [name] not having any symptoms of congestive heart failure. Increase her Coreg to 25 BID   2. Hypertension - BP is a bit high.  Will increase Coreg to 25  BID    3. COPD 4. Diabetes mellitus 5. History colon cancer   Current medicines are reviewed at length with the patient today.  The patient does not have concerns regarding medicines.  The following changes have been made:  Increase coreg to 25 BID    Labs/ tests ordered today include:   No orders of the defined types were placed in this encounter.    Disposition:   FU with me in 6 months  .  Will get an echo in 6 months     [name], Wonda Cheng, MD  05/01/2015 10:02 AM    [address], [place], [place]  [address] Phone: [phone]; Fax: [phone]   [facility]  [address] [place], [place]  [address] [phone]    Fax [phone]

## 2015-05-02 ENCOUNTER — Ambulatory Visit (INDEPENDENT_AMBULATORY_CARE_PROVIDER_SITE_OTHER): Payer: 59 | Admitting: Internal Medicine

## 2015-05-02 ENCOUNTER — Encounter: Payer: Self-pay | Admitting: Internal Medicine

## 2015-05-02 VITALS — BP 130/64 | HR 86 | Wt 175.0 lb

## 2015-05-02 DIAGNOSIS — E1140 Type 2 diabetes mellitus with diabetic neuropathy, unspecified: Secondary | ICD-10-CM

## 2015-05-02 DIAGNOSIS — IMO0002 Reserved for concepts with insufficient information to code with codable children: Secondary | ICD-10-CM

## 2015-05-02 DIAGNOSIS — E669 Obesity, unspecified: Secondary | ICD-10-CM | POA: Diagnosis not present

## 2015-05-02 DIAGNOSIS — E1159 Type 2 diabetes mellitus with other circulatory complications: Secondary | ICD-10-CM | POA: Diagnosis not present

## 2015-05-02 DIAGNOSIS — I10 Essential (primary) hypertension: Secondary | ICD-10-CM

## 2015-05-02 DIAGNOSIS — I2510 Atherosclerotic heart disease of native coronary artery without angina pectoris: Secondary | ICD-10-CM

## 2015-05-02 DIAGNOSIS — E1165 Type 2 diabetes mellitus with hyperglycemia: Secondary | ICD-10-CM

## 2015-05-02 NOTE — Assessment & Plan Note (Signed)
Norvasc, Avapro, Coreg 

## 2015-05-02 NOTE — Assessment & Plan Note (Signed)
Dr Acie Fredrickson Aspirin, Crestor

## 2015-05-02 NOTE — Assessment & Plan Note (Signed)
On Metformin, Crestor

## 2015-05-02 NOTE — Progress Notes (Signed)
Subjective:  Patient ID: [name], female    DOB: 21-Dec-1950  Age: 64 y.o. MRN: [id number]  CC: No chief complaint on file.   HPI [name] presents for DM2 - better on Glipizide, HTN, CRI f/u.  Outpatient Prescriptions Prior to Visit  Medication Sig Dispense Refill  . amLODipine (NORVASC) 10 MG tablet Take 0.5 tablets (5 mg total) by mouth daily. 30 tablet 11  . aspirin 81 MG chewable tablet Chew 1 tablet (81 mg total) by mouth daily.    . calcium carbonate (OS-CAL) 600 MG TABS tablet Take 600 mg by mouth 2 (two) times daily with a meal.    . carvedilol (COREG) 25 MG tablet Take 1 tablet (25 mg total) by mouth 2 (two) times daily with a meal. 60 tablet 11  . furosemide (LASIX) 80 MG tablet Take 0.5 tablets (40 mg total) by mouth daily. 45 tablet 1  . glipiZIDE (GLUCOTROL XL) 5 MG 24 hr tablet Take 1 tablet (5 mg total) by mouth daily with breakfast. 90 tablet 1  . glucose blood (ONE TOUCH ULTRA TEST) test strip Use to test blood sugar 2 times daily as instructed. Dx: E11.40 75 each 11  . irbesartan (AVAPRO) 300 MG tablet Take 1 tablet (300 mg total) by mouth daily. 30 tablet 11  . KLOR-CON M20 20 MEQ tablet TAKE ONE TABLET BY MOUTH ONCE DAILY 30 tablet 11  . metFORMIN (GLUCOPHAGE-XR) 500 MG 24 hr tablet Take 1 tablet (500 mg total) by mouth 2 (two) times daily. 180 tablet 1  . nitroGLYCERIN (NITROSTAT) 0.4 MG SL tablet Place 1 tablet (0.4 mg total) under the tongue every 5 (five) minutes as needed for chest pain. 20 tablet 3  . ranitidine (ZANTAC) 150 MG tablet Take 1 tablet (150 mg total) by mouth 2 (two) times daily. 60 tablet 11  . rosuvastatin (CRESTOR) 5 MG tablet Take 1 tablet (5 mg total) by mouth daily at 6 PM. 30 tablet 11  . vitamin B-12 (CYANOCOBALAMIN) 1000 MCG tablet Take 1,000 mcg by mouth daily.     No facility-administered medications prior to visit.    ROS Review of Systems  Constitutional: Positive for fatigue. Negative for chills, activity  change, appetite change and unexpected weight change.  HENT: Negative for congestion, mouth sores and sinus pressure.   Eyes: Negative for visual disturbance.  Respiratory: Negative for cough and chest tightness.   Gastrointestinal: Negative for nausea and abdominal pain.  Genitourinary: Negative for frequency, difficulty urinating and vaginal pain.  Musculoskeletal: Positive for back pain. Negative for gait problem.  Skin: Negative for pallor and rash.  Neurological: Negative for dizziness, tremors, weakness, numbness and headaches.  Psychiatric/Behavioral: Negative for suicidal ideas, confusion and sleep disturbance. The patient is not nervous/anxious.     Objective:  BP 130/64 mmHg  Pulse 86  Wt 175 lb (79.379 kg)  SpO2 99%  BP Readings from Last 3 Encounters:  05/02/15 130/64  05/01/15 150/80  03/19/15 128/64    Wt Readings from Last 3 Encounters:  05/02/15 175 lb (79.379 kg)  05/01/15 173 lb 12.8 oz (78.835 kg)  03/19/15 174 lb (78.926 kg)    Physical Exam  Constitutional: She appears well-developed. No distress.  HENT:  Head: Normocephalic.  Right Ear: External ear normal.  Left Ear: External ear normal.  Nose: Nose normal.  Mouth/Throat: Oropharynx is clear and moist.  Eyes: Conjunctivae are normal. Pupils are equal, round, and reactive to light. Right eye exhibits no discharge. Left eye  exhibits no discharge.  Neck: Normal range of motion. Neck supple. No JVD present. No tracheal deviation present. No thyromegaly present.  Cardiovascular: Normal rate, regular rhythm and normal heart sounds.   Pulmonary/Chest: No stridor. No respiratory distress. She has no wheezes.  Abdominal: Soft. Bowel sounds are normal. She exhibits no distension and no mass. There is no tenderness. There is no rebound and no guarding.  Musculoskeletal: She exhibits tenderness. She exhibits no edema.  Lymphadenopathy:    She has no cervical adenopathy.  Neurological: She displays normal  reflexes. No cranial nerve deficit. She exhibits normal muscle tone. Coordination normal.  Skin: No rash noted. No erythema.  Psychiatric: She has a normal mood and affect. Her behavior is normal. Judgment and thought content normal.  L arm biceps tendon is tender w/ROM, palpation  Lab Results  Component Value Date   WBC 4.3 09/21/2014   HGB 11.9* 09/21/2014   HCT 34.3* 09/21/2014   PLT 131.0* 09/21/2014   GLUCOSE 189* 05/01/2015   CHOL 138 11/02/2014   TRIG 100.0 11/02/2014   HDL 40.90 11/02/2014   LDLCALC 77 11/02/2014   ALT 11 09/21/2014   AST 10 09/21/2014   NA 139 05/01/2015   K 4.6 05/01/2015   CL 108 05/01/2015   CREATININE 1.62* 05/01/2015   BUN 31* 05/01/2015   CO2 24 05/01/2015   TSH 2.19 10/03/2013   INR 1.16 06/07/2013   HGBA1C 10.1 03/19/2015   MICROALBUR 13.9* 10/13/2007    US Soft Tissue Head/neck  10/19/2014  CLINICAL DATA:  Thyroid goiter on physical examination. EXAM: THYROID ULTRASOUND TECHNIQUE: Ultrasound examination of the thyroid gland and adjacent soft tissues was performed. COMPARISON:  None. FINDINGS: Right thyroid lobe Measurements: 4.0 x 1.5 x 1.6 cm. Heterogeneous parenchyma with multiple small cysts identified. The largest measures approximately 0.5 x 0.3 x 0.5 cm. All of the small cysts in the right lobe appears simple and benign. Left thyroid lobe Measurements: 4.5 x 1.4 x 2.0 cm. Heterogeneous parenchyma with multiple cysts. The largest measures approximately 1.0 x 0.4 x 0.5 cm. 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 Similar smaller cyst measures 0.8 cm in greatest diameter. There is a small solid nodule measuring 0.7 x 0.4 x 0.6 cm. Isthmus Thickness: 0.4 cm.  No nodules visualized. Lymphadenopathy None visualized. IMPRESSION: Mildly prominent thyroid gland size without significant enlargement. Heterogeneous parenchyma with multiple small areas of nodularity identified bilaterally. 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 Only 1 solid nodule is identified in the left lobe measuring 0.7 cm. Electronically Signed   By: Aletta Edouard M.D.   On: 10/19/2014 10:53    Assessment & Plan:   There are no diagnoses linked to this encounter. I am having Ms. Velasquez maintain her vitamin B-12, aspirin, nitroGLYCERIN, calcium carbonate, amLODipine, irbesartan, furosemide, rosuvastatin, ranitidine, KLOR-CON M20, glipiZIDE, metFORMIN, glucose blood, and carvedilol.  No orders of the defined types were placed in this encounter.     Follow-up: Return in about 4 months (around 09/02/2015) for a follow-up visit.  Walker Kehr, MD

## 2015-05-02 NOTE — Progress Notes (Signed)
Pre visit review using our clinic review tool, if applicable. No additional management support is needed unless otherwise documented below in the visit note. 

## 2015-05-02 NOTE — Patient Instructions (Addendum)
NeilMed sinus irrigation You can try Flonase or Afrin for nasal congestion too Nexium 24 hrs 1 in am

## 2015-05-02 NOTE — Assessment & Plan Note (Signed)
Stable wt lately - no wt gain

## 2015-06-09 ENCOUNTER — Other Ambulatory Visit: Payer: Self-pay | Admitting: Internal Medicine

## 2015-06-18 ENCOUNTER — Other Ambulatory Visit (INDEPENDENT_AMBULATORY_CARE_PROVIDER_SITE_OTHER): Payer: 59 | Admitting: *Deleted

## 2015-06-18 ENCOUNTER — Encounter: Payer: Self-pay | Admitting: Internal Medicine

## 2015-06-18 ENCOUNTER — Ambulatory Visit (INDEPENDENT_AMBULATORY_CARE_PROVIDER_SITE_OTHER): Payer: 59 | Admitting: Internal Medicine

## 2015-06-18 VITALS — BP 118/64 | HR 80 | Temp 97.8°F | Resp 12 | Wt 170.0 lb

## 2015-06-18 DIAGNOSIS — IMO0002 Reserved for concepts with insufficient information to code with codable children: Secondary | ICD-10-CM

## 2015-06-18 DIAGNOSIS — E1165 Type 2 diabetes mellitus with hyperglycemia: Secondary | ICD-10-CM

## 2015-06-18 DIAGNOSIS — E042 Nontoxic multinodular goiter: Secondary | ICD-10-CM | POA: Diagnosis not present

## 2015-06-18 DIAGNOSIS — E1140 Type 2 diabetes mellitus with diabetic neuropathy, unspecified: Secondary | ICD-10-CM

## 2015-06-18 LAB — POCT GLYCOSYLATED HEMOGLOBIN (HGB A1C): Hemoglobin A1C: 7.1

## 2015-06-18 NOTE — Patient Instructions (Signed)
Please continue Metformin XR 500 mg 2x a day. Continue Glipizide ER 5 mg before breakfast.  Please come back for a follow-up appointment in 3 months.  CONGRATULATIONS!!!! KEEP UP THE GREAT WORK!

## 2015-06-18 NOTE — Progress Notes (Signed)
Patient ID: [name], female   DOB: 08-01-1950, 64 y.o.   MRN: [id number]  HPI: [name] is a 64 y.o.-year-old femalemale, returning for f/u DM2, dx in 2006, insulin-dependent, uncontrolled, with complications (CAD - s/p AMI 06/2013, CHF; CKD; PN). Last visit 3 mo ago.  She started to change her diet after her hemoglobin A1c returned high, at 16.2% in 09/2014. She cut down portions, changed the meal content to include more fiber and less carbs. Drinks a lot of water. She lost 5 lbs since last visit! Her sugars are  Last hemoglobin A1c was: Lab Results  Component Value Date   HGBA1C 10.1 03/19/2015   HGBA1C 11.7* 12/21/2014   HGBA1C 16.2* 09/21/2014  Pt has refused insulin repeatedly in the past.  Pt is on: - Metformin ER 500 mg 2x a day - started 10/2014 - Glipizide ER 5 mg in am - started 03/2015 She tried Tradjenta 5 mg daily in am >> CP with it (started 10/2014) >> had to stop - she did see her cardiologist since then She tried [place] >> decreased [name] and yeast inf. She tried regular metformin >> diarrhea  Pt checks her sugars 2x a day and they are wonderful: - am: 340-400s >> 76-125 >> 98-140, 160, 201 >> 79, 80-120, 132 - 2h after b'fast: n/c  - before lunch: n/c >> 79-130, 145 >> n/c  - 2h after lunch: n/c - before dinner: n/c >> 86-122 >> n/c >> 85-116, 123 - 2h after dinner: n/c - bedtime: n/c >> 76-142 >> 90, 122-180, 199 >> n/c - nighttime: n/c No lows. Lowest sugar was 300s >> 76 >> 90 >> 79; ? hypoglycemia awareness. Highest sugar was 573 >> 155 >> 199  Glucometer: One Touch Ultra  Pt's meals are: - Breakfast: oatmeal, cereals, [place] bacon, egg toast - Lunch: sandwich or fruit salad - Dinner: salmon/chicken, Brussel sprouts, other veggies, sweet potatoes or brown rice - Snacks: 2: carrots with dip; yoghurt with fruit, salad at 10 pm  She walks for exercise.  - + CKD, last BUN/creatinine:  Lab Results  Component Value Date   BUN 31* 05/01/2015    CREATININE 1.62* 05/01/2015  On Irbesartan. - last set of lipids: Lab Results  Component Value Date   CHOL 138 11/02/2014   HDL 40.90 11/02/2014   LDLCALC 77 11/02/2014   TRIG 100.0 11/02/2014   CHOLHDL 3 11/02/2014  On Crestor 5 mg daily. - last eye exam was in 02/2015. No DR. + cataracts.  - + numbness and tingling in her feet. Has PN.   She also has a history of hypertension, GERD, and anemia.  ROS: Constitutional: + Weight loss, + hot flashes Eyes: + improved blurry vision, no xerophthalmia ENT: no sore throat, no nodules palpated in throat, no dysphagia/odynophagia, no hoarseness Cardiovascular: no CP/SOB/palpitations/leg swelling Respiratory: + cough/no SOB Gastrointestinal: no N/V/D/+ C/ + acid reflux Musculoskeletal: no muscle/+ joint aches Skin: no rashes Neurological: no tremors/numbness/tingling/dizziness, no headaches  I reviewed pt's medications, allergies, PMH, social hx, family hx, and changes were documented in the history of present illness. Otherwise, unchanged from my initial visit note.  Past Medical History  Diagnosis Date  . Anemia   . Personal history of colon cancer   . Diabetes mellitus   . Hyperlipidemia   . Hypertension   . Low back pain   . Obesity   . COPD (chronic obstructive pulmonary disease) ([place])   . Renal insufficiency   . NSTEMI (non-ST elevated myocardial  infarction) ([place]) 06/11/2013  . Ischemic cardiomyopathy 06/11/2013   Past Surgical History  Procedure Laterality Date  . Colectomy    . Left heart catheterization with coronary angiogram N/A 06/09/2013    Procedure: LEFT HEART CATHETERIZATION WITH CORONARY ANGIOGRAM;  Surgeon: [name] Dial, MD;  Location[name];  Service: Cardiovascular;  Laterality: N/A;   History   Social History  . Marital Status: Single    Spouse Name: N/A  . Number of Children: 0   Occupational History  . retired   Social History Main Topics  . Smoking status:  former smoker     Types:  Cigarettes  . Smokeless tobacco: Never Used  . Alcohol Use: No  . Drug Use: No   Current Outpatient Prescriptions on File Prior to Visit  Medication Sig Dispense Refill  . amLODipine (NORVASC) 10 MG tablet Take 0.5 tablets (5 mg total) by mouth daily. 30 tablet 11  . aspirin 81 MG chewable tablet Chew 1 tablet (81 mg total) by mouth daily.    . calcium carbonate (OS-CAL) 600 MG TABS tablet Take 600 mg by mouth 2 (two) times daily with a meal.    . carvedilol (COREG) 25 MG tablet Take 1 tablet (25 mg total) by mouth 2 (two) times daily with a meal. 60 tablet 11  . furosemide (LASIX) 80 MG tablet TAKE ONE-HALF TABLET BY MOUTH ONCE DAILY 45 tablet 0  . glipiZIDE (GLUCOTROL XL) 5 MG 24 hr tablet Take 1 tablet (5 mg total) by mouth daily with breakfast. 90 tablet 1  . glucose blood (ONE TOUCH ULTRA TEST) test strip Use to test blood sugar 2 times daily as instructed. Dx: E11.40 75 each 11  . irbesartan (AVAPRO) 300 MG tablet Take 1 tablet (300 mg total) by mouth daily. 30 tablet 11  . KLOR-CON M20 20 MEQ tablet TAKE ONE TABLET BY MOUTH ONCE DAILY 30 tablet 11  . metFORMIN (GLUCOPHAGE-XR) 500 MG 24 hr tablet Take 1 tablet (500 mg total) by mouth 2 (two) times daily. 180 tablet 1  . nitroGLYCERIN (NITROSTAT) 0.4 MG SL tablet Place 1 tablet (0.4 mg total) under the tongue every 5 (five) minutes as needed for chest pain. 20 tablet 3  . ranitidine (ZANTAC) 150 MG tablet Take 1 tablet (150 mg total) by mouth 2 (two) times daily. 60 tablet 11  . rosuvastatin (CRESTOR) 5 MG tablet Take 1 tablet (5 mg total) by mouth daily at 6 PM. 30 tablet 11  . vitamin B-12 (CYANOCOBALAMIN) 1000 MCG tablet Take 1,000 mcg by mouth daily.     No current facility-administered medications on file prior to visit.   Allergies  Allergen Reactions  . Tradjenta [Linagliptin] Anaphylaxis    CP  . Atorvastatin     REACTION: aches and pains  . Enalapril Maleate     REACTION: cough  . Farxiga [Dapagliflozin] Itching  .  Hydrochlorothiazide     REACTION: hair loss  . Kenalog [Triamcinolone Acetonide]     HANDS NUMB   . Metformin And Related     Diarrhea, dizziness  . Propoxyphene N-Acetaminophen Hives  . Simvastatin     REACTION: cramps  . Spironolactone     REACTION: cramps   Family History  Problem Relation Age of Onset  . Hypertension     PE: BP 118/64 mmHg  Pulse 80  Temp(Src) 97.8 F (36.6 C) (Oral)  Resp 12  Wt 170 lb (77.111 kg)  SpO2 94% Body mass index is 31.09 kg/(m^2).  Wt Readings from Last 3 Encounters:  06/18/15 170 lb (77.111 kg)  05/02/15 175 lb (79.379 kg)  05/01/15 173 lb 12.8 oz (78.835 kg)   Constitutional: overweight, in NAD Eyes: PERRLA, EOMI, no exophthalmos ENT: moist mucous membranes, + symmetric thyromegaly, no cervical lymphadenopathy Cardiovascular: RRR, No MRG Respiratory: CTA B Gastrointestinal: abdomen soft, NT, ND, BS+ Musculoskeletal: no deformities, strength intact in all 4 Skin: moist, warm, no rashes Neurological: no tremor with outstretched hands, DTR normal in all 4  ASSESSMENT: 1. DM2, insulin-dependent (refusing insulin), uncontrolled, with complications - CAD, s/p AMI 06/2013 - Dr Acie Fredrickson - CHF - CKD - PN  2. Goiter - Reviewed latest TSH level: Lab Results  Component Value Date   TSH 2.19 10/03/2013  - Patient has a goiter at palpation - No neck compression symptoms - we obtained a thyroid ultrasound >> multiple small cysts >> no further investigation necessary    10/19/2014: thyroid ultrasound: Right thyroid lobe: 4.0 x 1.5 x 1.6 cm. Heterogeneous parenchyma with multiple small cysts identified. The largest measures approximately 0.5 x 0.3 x 0.5 cm. All of the small cysts in the right lobe appears simple and benign.  Left thyroid lobe: 4.5 x 1.4 x 2.0 cm. Heterogeneous parenchyma with multiple cysts. The largest measures approximately 1.0 x 0.4 x 0.5 cm. 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  Similar smaller cyst measures 0.8 cm in greatest diameter. There is a small solid nodule measuring 0.7 x 0.4 x 0.6 cm.  Isthmus Thickness: 0.4 cm. No nodules visualized.  Lymphadenopathy: None visualized.          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 No intervention needed.  PLAN:  1. Patient with long-standing, uncontrolled diabetes, with initially extremely high sugars, now [facility] improved with changing to Metformin ER and addition of Glipizide ER. She refused insulin in the past but has done well after starting po meds and changing her diet and now sugars are at goal! - I suggested to:  Patient Instructions  Please continue Metformin XR 500 mg 2x a day. Continue Glipizide ER 5 mg before breakfast.  Please come back for a follow-up appointment in 3 months.  - continue checking sugars at different times of the day - check 2 times a day, rotating checks - advised for yearly eye exams - UTD - refuses flu shot - check hemoglobin A1c >> 7.1% (impressive decrease!) - Return to clinic in 3 mo with sugar log   2. Goiter - No neck compression symptoms - small thyroid cysts per last U/S in 10/2014

## 2015-09-05 ENCOUNTER — Ambulatory Visit: Payer: 59 | Admitting: Internal Medicine

## 2015-09-16 ENCOUNTER — Ambulatory Visit (INDEPENDENT_AMBULATORY_CARE_PROVIDER_SITE_OTHER): Payer: BLUE CROSS/BLUE SHIELD | Admitting: Internal Medicine

## 2015-09-16 ENCOUNTER — Encounter: Payer: Self-pay | Admitting: Internal Medicine

## 2015-09-16 ENCOUNTER — Ambulatory Visit: Payer: 59 | Admitting: Internal Medicine

## 2015-09-16 VITALS — BP 140/70 | HR 75 | Wt 185.0 lb

## 2015-09-16 DIAGNOSIS — E1140 Type 2 diabetes mellitus with diabetic neuropathy, unspecified: Secondary | ICD-10-CM | POA: Diagnosis not present

## 2015-09-16 DIAGNOSIS — K2100 Gastro-esophageal reflux disease with esophagitis, without bleeding: Secondary | ICD-10-CM

## 2015-09-16 DIAGNOSIS — M25512 Pain in left shoulder: Secondary | ICD-10-CM

## 2015-09-16 DIAGNOSIS — J309 Allergic rhinitis, unspecified: Secondary | ICD-10-CM | POA: Insufficient documentation

## 2015-09-16 DIAGNOSIS — IMO0002 Reserved for concepts with insufficient information to code with codable children: Secondary | ICD-10-CM

## 2015-09-16 DIAGNOSIS — E1165 Type 2 diabetes mellitus with hyperglycemia: Secondary | ICD-10-CM

## 2015-09-16 MED ORDER — TRAMADOL-ACETAMINOPHEN 37.5-325 MG PO TABS: 0.5000 | ORAL_TABLET | Freq: Three times a day (TID) | ORAL | Status: DC | PRN

## 2015-09-16 MED ORDER — FLUTICASONE PROPIONATE 50 MCG/ACT NA SUSP: 2.0000 | Freq: Every day | NASAL | Status: DC

## 2015-09-16 MED ORDER — LORATADINE 10 MG PO TABS: 10.0000 mg | ORAL_TABLET | Freq: Every day | ORAL | Status: DC

## 2015-09-16 NOTE — Assessment & Plan Note (Signed)
3/17  Claritin, Flonase prn

## 2015-09-16 NOTE — Assessment & Plan Note (Addendum)
3/17 ?L shoulder bursitis vs other (radiculopathy) Pt declined injection Sports med ref, X rays offered Tramadol prn Pillows props at night

## 2015-09-16 NOTE — Progress Notes (Signed)
Subjective:  Patient ID: [name], female    DOB: 1951/01/19  Age: 65 y.o. MRN: [id number]  CC: No chief complaint on file.   HPI ANETA FIEST presents for HTN, DM2 f/u. C/o L arm pain x 2 mo when laying in bed; no h/o injury  Outpatient Prescriptions Prior to Visit  Medication Sig Dispense Refill  . amLODipine (NORVASC) 10 MG tablet Take 0.5 tablets (5 mg total) by mouth daily. 30 tablet 11  . aspirin 81 MG chewable tablet Chew 1 tablet (81 mg total) by mouth daily.    . calcium carbonate (OS-CAL) 600 MG TABS tablet Take 600 mg by mouth 2 (two) times daily with a meal.    . carvedilol (COREG) 25 MG tablet Take 1 tablet (25 mg total) by mouth 2 (two) times daily with a meal. 60 tablet 11  . furosemide (LASIX) 80 MG tablet TAKE ONE-HALF TABLET BY MOUTH ONCE DAILY 45 tablet 0  . glipiZIDE (GLUCOTROL XL) 5 MG 24 hr tablet Take 1 tablet (5 mg total) by mouth daily with breakfast. 90 tablet 1  . glucose blood (ONE TOUCH ULTRA TEST) test strip Use to test blood sugar 2 times daily as instructed. Dx: E11.40 75 each 11  . irbesartan (AVAPRO) 300 MG tablet Take 1 tablet (300 mg total) by mouth daily. 30 tablet 11  . KLOR-CON M20 20 MEQ tablet TAKE ONE TABLET BY MOUTH ONCE DAILY 30 tablet 11  . metFORMIN (GLUCOPHAGE-XR) 500 MG 24 hr tablet Take 1 tablet (500 mg total) by mouth 2 (two) times daily. 180 tablet 1  . nitroGLYCERIN (NITROSTAT) 0.4 MG SL tablet Place 1 tablet (0.4 mg total) under the tongue every 5 (five) minutes as needed for chest pain. 20 tablet 3  . ranitidine (ZANTAC) 150 MG tablet Take 1 tablet (150 mg total) by mouth 2 (two) times daily. 60 tablet 11  . rosuvastatin (CRESTOR) 5 MG tablet Take 1 tablet (5 mg total) by mouth daily at 6 PM. 30 tablet 11  . vitamin B-12 (CYANOCOBALAMIN) 1000 MCG tablet Take 1,000 mcg by mouth daily.     No facility-administered medications prior to visit.    ROS Review of Systems  Constitutional: Negative for chills, activity  change, appetite change, fatigue and unexpected weight change.  HENT: Negative for congestion, mouth sores and sinus pressure.   Eyes: Negative for visual disturbance.  Respiratory: Negative for cough and chest tightness.   Gastrointestinal: Negative for nausea and abdominal pain.  Genitourinary: Negative for frequency, difficulty urinating and vaginal pain.  Musculoskeletal: Positive for arthralgias. Negative for back pain, gait problem and neck pain.  Skin: Negative for pallor and rash.  Neurological: Negative for dizziness, tremors, weakness, numbness and headaches.  Psychiatric/Behavioral: Negative for suicidal ideas, confusion and sleep disturbance.    Objective:  BP 140/70 mmHg  Pulse 75  Wt 185 lb (83.915 kg)  SpO2 99%  BP Readings from Last 3 Encounters:  09/16/15 140/70  06/18/15 118/64  05/02/15 130/64    Wt Readings from Last 3 Encounters:  09/16/15 185 lb (83.915 kg)  06/18/15 170 lb (77.111 kg)  05/02/15 175 lb (79.379 kg)    Physical Exam  Constitutional: She appears well-developed. No distress.  HENT:  Head: Normocephalic.  Right Ear: External ear normal.  Left Ear: External ear normal.  Nose: Nose normal.  Mouth/Throat: Oropharynx is clear and moist.  Eyes: Conjunctivae are normal. Pupils are equal, round, and reactive to light. Right eye exhibits no discharge. Left  eye exhibits no discharge.  Neck: Normal range of motion. Neck supple. No JVD present. No tracheal deviation present. No thyromegaly present.  Cardiovascular: Normal rate, regular rhythm and normal heart sounds.   Pulmonary/Chest: No stridor. No respiratory distress. She has no wheezes.  Abdominal: Soft. Bowel sounds are normal. She exhibits no distension and no mass. There is no tenderness. There is no rebound and no guarding.  Musculoskeletal: She exhibits tenderness. She exhibits no edema.  Lymphadenopathy:    She has no cervical adenopathy.  Neurological: She displays normal reflexes. No  cranial nerve deficit. She exhibits normal muscle tone. Coordination normal.  Skin: No rash noted. No erythema.  Psychiatric: She has a normal mood and affect. Her behavior is normal. Judgment and thought content normal.  R shoulder is tender MS, DTRs ok B Obese  Lab Results  Component Value Date   WBC 4.3 09/21/2014   HGB 11.9* 09/21/2014   HCT 34.3* 09/21/2014   PLT 131.0* 09/21/2014   GLUCOSE 189* 05/01/2015   CHOL 138 11/02/2014   TRIG 100.0 11/02/2014   HDL 40.90 11/02/2014   LDLCALC 77 11/02/2014   ALT 11 09/21/2014   AST 10 09/21/2014   NA 139 05/01/2015   K 4.6 05/01/2015   CL 108 05/01/2015   CREATININE 1.62* 05/01/2015   BUN 31* 05/01/2015   CO2 24 05/01/2015   TSH 2.19 10/03/2013   INR 1.16 06/07/2013   HGBA1C 7.1 06/18/2015   MICROALBUR 13.9* 10/13/2007    US Soft Tissue Head/neck  10/19/2014  CLINICAL DATA:  Thyroid goiter on physical examination. EXAM: THYROID ULTRASOUND TECHNIQUE: Ultrasound examination of the thyroid gland and adjacent soft tissues was performed. COMPARISON:  None. FINDINGS: Right thyroid lobe Measurements: 4.0 x 1.5 x 1.6 cm. Heterogeneous parenchyma with multiple small cysts identified. The largest measures approximately 0.5 x 0.3 x 0.5 cm. All of the small cysts in the right lobe appears simple and benign. Left thyroid lobe Measurements: 4.5 x 1.4 x 2.0 cm. Heterogeneous parenchyma with multiple cysts. The largest measures approximately 1.0 x 0.4 x 0.5 cm. 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 Similar smaller cyst measures 0.8 cm in greatest diameter. There is a small solid nodule measuring 0.7 x 0.4 x 0.6 cm. Isthmus Thickness: 0.4 cm.  No nodules visualized. Lymphadenopathy None visualized. IMPRESSION: Mildly prominent thyroid gland size without significant enlargement. Heterogeneous parenchyma with multiple small areas of nodularity identified bilaterally. 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 Only 1 solid nodule is identified in the left lobe measuring 0.7 cm. Electronically Signed   By: Aletta Edouard M.D.   On: 10/19/2014 10:53    Assessment & Plan:   There are no diagnoses linked to this encounter. I am having Ms. Rubis maintain her vitamin B-12, aspirin, nitroGLYCERIN, calcium carbonate, amLODipine, irbesartan, rosuvastatin, ranitidine, KLOR-CON M20, glipiZIDE, metFORMIN, glucose blood, carvedilol, and furosemide.  No orders of the defined types were placed in this encounter.     Follow-up: No Follow-up on file.  Walker Kehr, MD

## 2015-09-16 NOTE — Assessment & Plan Note (Signed)
On Zantac now

## 2015-09-16 NOTE — Progress Notes (Signed)
Pre visit review using our clinic review tool, if applicable. No additional management support is needed unless otherwise documented below in the visit note. 

## 2015-09-16 NOTE — Assessment & Plan Note (Signed)
appt w/Dr Cruzita Lederer is pending   On Metformin, Crestor

## 2015-09-20 ENCOUNTER — Ambulatory Visit (INDEPENDENT_AMBULATORY_CARE_PROVIDER_SITE_OTHER): Payer: BLUE CROSS/BLUE SHIELD | Admitting: Internal Medicine

## 2015-09-20 ENCOUNTER — Encounter: Payer: Self-pay | Admitting: Internal Medicine

## 2015-09-20 ENCOUNTER — Other Ambulatory Visit (INDEPENDENT_AMBULATORY_CARE_PROVIDER_SITE_OTHER): Payer: BLUE CROSS/BLUE SHIELD | Admitting: *Deleted

## 2015-09-20 VITALS — BP 118/68 | HR 87 | Temp 98.3°F | Resp 12 | Wt 182.6 lb

## 2015-09-20 DIAGNOSIS — E1165 Type 2 diabetes mellitus with hyperglycemia: Secondary | ICD-10-CM | POA: Diagnosis not present

## 2015-09-20 DIAGNOSIS — E1140 Type 2 diabetes mellitus with diabetic neuropathy, unspecified: Secondary | ICD-10-CM

## 2015-09-20 DIAGNOSIS — IMO0002 Reserved for concepts with insufficient information to code with codable children: Secondary | ICD-10-CM

## 2015-09-20 LAB — POCT GLYCOSYLATED HEMOGLOBIN (HGB A1C): Hemoglobin A1C: 7.2

## 2015-09-20 NOTE — Progress Notes (Signed)
Patient ID: [name], female   DOB: [birth year], 65 y.o.   MRN: [id number]  HPI: [name] is a 65 y.o.-year-old female, returning for f/u DM2, dx in 2006, insulin-dependent, uncontrolled, with complications (CAD - s/p AMI 06/2013, CHF; CKD; PN). Last visit 3 mo ago.  She started to change her diet after her hemoglobin A1c returned high, at 16.2% in 09/2014. She cut down portions, changed the meal content to include more fiber and less carbs. HbA1c decreased dramatically.  Last hemoglobin A1c was: Lab Results  Component Value Date   HGBA1C 7.1 06/18/2015   HGBA1C 10.1 03/19/2015   HGBA1C 11.7* 12/21/2014  Pt has refused insulin repeatedly in the past.  Pt is on: - Metformin ER 500 mg 2x a day - started 10/2014 - Glipizide ER 5 mg in am - started 03/2015 She tried Tradjenta 5 mg daily in am >> CP with it (started 10/2014) >> had to stop - she did see her cardiologist since then She tried [place] >> decreased [name] and yeast inf. She tried regular metformin >> diarrhea  Pt checks her sugars 2x a day and they are still wonderful: - am: 340-400s >> 76-125 >> 98-140, 160, 201 >> 79, 80-120, 132 >> 66, 69-109 - 2h after b'fast: n/c  - before lunch: n/c >> 79-130, 145 >> n/c  - 2h after lunch: n/c - before dinner: n/c >> 86-122 >> n/c >> 85-116, 123 >> 88-120 - 2h after dinner: n/c - bedtime: n/c >> 76-142 >> 90, 122-180, 199 >> n/c - nighttime: n/c No lows. Lowest sugar was 300s >> 76 >> 90 >> 79 >> 66; ? hypoglycemia awareness (does not feel CBGs in the 60s) Highest sugar was 573 >> 155 >> 199 >> 130  Glucometer: One Touch Ultra  Pt's meals are: - Breakfast: oatmeal, cereals, [place] bacon, egg toast - Lunch: sandwich or fruit salad - Dinner: salmon/chicken, Brussel sprouts, other veggies, sweet potatoes or brown rice, spaghetti - Snacks: 2: carrots with dip; yoghurt with fruit, salad at 10 pm, cereals after dinner  She walks for exercise.  - + CKD, last  BUN/creatinine:  Lab Results  Component Value Date   BUN 31* 05/01/2015   CREATININE 1.62* 05/01/2015  On Irbesartan. - last set of lipids: Lab Results  Component Value Date   CHOL 138 11/02/2014   HDL 40.90 11/02/2014   LDLCALC 77 11/02/2014   TRIG 100.0 11/02/2014   CHOLHDL 3 11/02/2014  On Crestor 5 mg daily. - last eye exam was in 02/2015. No DR. + cataracts.  - + numbness and tingling in her feet. Has PN.   She also has a history of hypertension, GERD, and anemia.  ROS: Constitutional: + Weight gain, + cold intolerance and hot flushes Eyes: no blurry vision, no xerophthalmia ENT: no sore throat, no nodules palpated in throat, no dysphagia/odynophagia, no hoarseness Cardiovascular: no CP/SOB/palpitations/leg swelling Respiratory: + cough/no SOB Gastrointestinal: no N/V/D/+ C/ + acid reflux Musculoskeletal: + muscle aches /no joint aches Skin: + rash, + hair loss Neurological: no tremors/numbness/tingling/dizziness, no headaches  I reviewed pt's medications, allergies, PMH, social hx, family hx, and changes were documented in the history of present illness. Otherwise, unchanged from my initial visit note.  Past Medical History  Diagnosis Date  . Anemia   . Personal history of colon cancer   . Diabetes mellitus   . Hyperlipidemia   . Hypertension   . Low back pain   . Obesity   . COPD (chronic  obstructive pulmonary disease) ([place])   . Renal insufficiency   . NSTEMI (non-ST elevated myocardial infarction) ([place]) 06/11/2013  . Ischemic cardiomyopathy 06/11/2013   Past Surgical History  Procedure Laterality Date  . Colectomy    . Left heart catheterization with coronary angiogram N/A 06/09/2013    Procedure: LEFT HEART CATHETERIZATION WITH CORONARY ANGIOGRAM;  Surgeon: Ramond Dial, MD;  Location: [facility] CATH LAB;  Service: Cardiovascular;  Laterality: N/A;   History   Social History  . Marital Status: Single    Spouse Name: N/A  . Number of Children: 0    Occupational History  . retired   Social History Main Topics  . Smoking status:  former smoker     Types: Cigarettes  . Smokeless tobacco: Never Used  . Alcohol Use: No  . Drug Use: No   Current Outpatient Prescriptions on File Prior to Visit  Medication Sig Dispense Refill  . amLODipine (NORVASC) 10 MG tablet Take 0.5 tablets (5 mg total) by mouth daily. 30 tablet 11  . aspirin 81 MG chewable tablet Chew 1 tablet (81 mg total) by mouth daily.    . calcium carbonate (OS-CAL) 600 MG TABS tablet Take 600 mg by mouth 2 (two) times daily with a meal.    . carvedilol (COREG) 25 MG tablet Take 1 tablet (25 mg total) by mouth 2 (two) times daily with a meal. 60 tablet 11  . fluticasone (FLONASE) 50 MCG/ACT nasal spray Place 2 sprays into both nostrils daily. 16 g 6  . furosemide (LASIX) 80 MG tablet TAKE ONE-HALF TABLET BY MOUTH ONCE DAILY 45 tablet 0  . glipiZIDE (GLUCOTROL XL) 5 MG 24 hr tablet Take 1 tablet (5 mg total) by mouth daily with breakfast. 90 tablet 1  . glucose blood (ONE TOUCH ULTRA TEST) test strip Use to test blood sugar 2 times daily as instructed. Dx: E11.40 75 each 11  . irbesartan (AVAPRO) 300 MG tablet Take 1 tablet (300 mg total) by mouth daily. 30 tablet 11  . KLOR-CON M20 20 MEQ tablet TAKE ONE TABLET BY MOUTH ONCE DAILY 30 tablet 11  . loratadine (CLARITIN) 10 MG tablet Take 1 tablet (10 mg total) by mouth daily. 100 tablet 3  . metFORMIN (GLUCOPHAGE-XR) 500 MG 24 hr tablet Take 1 tablet (500 mg total) by mouth 2 (two) times daily. 180 tablet 1  . nitroGLYCERIN (NITROSTAT) 0.4 MG SL tablet Place 1 tablet (0.4 mg total) under the tongue every 5 (five) minutes as needed for chest pain. 20 tablet 3  . ranitidine (ZANTAC) 150 MG tablet Take 1 tablet (150 mg total) by mouth 2 (two) times daily. 60 tablet 11  . rosuvastatin (CRESTOR) 5 MG tablet Take 1 tablet (5 mg total) by mouth daily at 6 PM. 30 tablet 11  . traMADol-acetaminophen (ULTRACET) 37.5-325 MG tablet Take  0.5-1 tablets by mouth every 8 (eight) hours as needed for severe pain. 100 tablet 2  . vitamin B-12 (CYANOCOBALAMIN) 1000 MCG tablet Take 1,000 mcg by mouth daily.     No current facility-administered medications on file prior to visit.   Allergies  Allergen Reactions  . Tradjenta [Linagliptin] Anaphylaxis    CP  . Atorvastatin     REACTION: aches and pains  . Enalapril Maleate     REACTION: cough  . Farxiga [Dapagliflozin] Itching  . Hydrochlorothiazide     REACTION: hair loss  . Kenalog [Triamcinolone Acetonide]     HANDS NUMB   . Metformin And Related  Diarrhea, dizziness  . Propoxyphene N-Acetaminophen Hives  . Simvastatin     REACTION: cramps  . Spironolactone     REACTION: cramps   Family History  Problem Relation Age of Onset  . Hypertension     PE: BP 118/68 mmHg  Pulse 87  Temp(Src) 98.3 F (36.8 C) (Oral)  Resp 12  Wt 182 lb 9.6 oz (82.827 kg)  SpO2 98% There is no weight on file to calculate BMI. Wt Readings from Last 3 Encounters:  09/20/15 182 lb 9.6 oz (82.827 kg)  09/16/15 185 lb (83.915 kg)  06/18/15 170 lb (77.111 kg)   Constitutional: overweight, in NAD Eyes: PERRLA, EOMI, no exophthalmos ENT: moist mucous membranes, + symmetric thyromegaly, no cervical lymphadenopathy Cardiovascular: RRR, No MRG Respiratory: CTA B Gastrointestinal: abdomen soft, NT, ND, BS+ Musculoskeletal: no deformities, strength intact in all 4 Skin: moist, warm, no rashes Neurological: no tremor with outstretched hands, DTR normal in all 4  ASSESSMENT: 1. DM2, insulin-dependent (refusing insulin), uncontrolled, with complications - CAD, s/p AMI 06/2013 - Dr Acie Fredrickson - CHF - CKD - PN  2. Goiter - Reviewed latest TSH level: Lab Results  Component Value Date   TSH 2.19 10/03/2013  - Patient has a goiter at palpation - No neck compression symptoms - we obtained a thyroid ultrasound >> multiple small cysts >> no further investigation necessary    10/19/2014:  thyroid ultrasound: Right thyroid lobe: 4.0 x 1.5 x 1.6 cm. Heterogeneous parenchyma with multiple small cysts identified. The largest measures approximately 0.5 x 0.3 x 0.5 cm. All of the small cysts in the right lobe appears simple and benign.  Left thyroid lobe: 4.5 x 1.4 x 2.0 cm. Heterogeneous parenchyma with multiple cysts. The largest measures approximately 1.0 x 0.4 x 0.5 cm. 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 Similar smaller cyst measures 0.8 cm in greatest diameter. There is a small solid nodule measuring 0.7 x 0.4 x 0.6 cm.  Isthmus Thickness: 0.4 cm. No nodules visualized.  Lymphadenopathy: None visualized.          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 No intervention needed.  PLAN:  1. Patient with long-standing, uncontrolled diabetes, with initially extremely high sugars, now [facility] improved with changing to Metformin ER and addition of Glipizide ER. She refused insulin in the past but has done well after starting po meds and changing her diet and now sugars are at goal! Last HbA1c decreased to 7.1%!  - she only checks sugars before b'fast and before dinner >> all great >> advised to rotate checks - I suggested to:  Patient Instructions  Please continue Metformin XR 500 mg 2x a day. Continue Glipizide ER 5 mg before breakfast.  Please come back for a follow-up appointment in 3 months.  - continue checking sugars at different times of the day - check 1-2 times a day, rotating checks - advised for yearly eye exams - UTD - refused flu shot - discussed her weight gain >> will start improving her diet - check hemoglobin A1c >> 7.2% (however, sugars in log are all at goal) - Return to clinic in 3 mo with sugar log   2. Goiter - No neck compression symptoms - small thyroid cysts per last U/S in 10/2014 - no further f/u necessary

## 2015-09-20 NOTE — Patient Instructions (Signed)
Please continue Metformin XR 500 mg 2x a day. Continue Glipizide ER 5 mg before breakfast.  Please come back for a follow-up appointment in 3 months.

## 2015-10-09 ENCOUNTER — Other Ambulatory Visit: Payer: Self-pay

## 2015-10-09 MED ORDER — NITROGLYCERIN 0.4 MG SL SUBL: 0.4000 mg | SUBLINGUAL_TABLET | SUBLINGUAL | Status: DC | PRN

## 2015-10-09 MED ORDER — FUROSEMIDE 80 MG PO TABS: 40.0000 mg | ORAL_TABLET | Freq: Every day | ORAL | Status: DC

## 2015-10-09 MED ORDER — AMLODIPINE BESYLATE 10 MG PO TABS: 5.0000 mg | ORAL_TABLET | Freq: Every day | ORAL | Status: DC

## 2015-10-16 ENCOUNTER — Other Ambulatory Visit: Payer: Self-pay | Admitting: *Deleted

## 2015-10-16 NOTE — Telephone Encounter (Signed)
metFORMIN (GLUCOPHAGE-XR) 500 MG 24 hr tablet  Medication   Date: 03/19/2015  Department: [facility]  Ordering/Authorizing: Philemon Kingdom, MD      Order Providers    Prescribing Provider Encounter Provider   Philemon Kingdom, MD Philemon Kingdom, MD    Medication Detail      Disp Refills Start End     metFORMIN (GLUCOPHAGE-XR) 500 MG 24 hr tablet 180 tablet 1 03/19/2015     Sig - Route: Take 1 tablet (500 mg total) by mouth 2 (two) times daily. - Oral    E-Prescribing Status: Receipt confirmed by pharmacy (03/19/2015 10:28 AM EDT)     Pharmacy    [facility] [place] ([place]), [place] - [address]   ranitidine (ZANTAC) 150 MG tablet  Medication   Date: 12/21/2014  Department: [facility]  Ordering/Authorizing: [name], MD      Order Providers    Prescribing Provider Encounter Provider   [name], MD [name], MD    Medication Detail      Disp Refills Start End     ranitidine (ZANTAC) 150 MG tablet 60 tablet 11 12/21/2014     Sig - Route: Take 1 tablet (150 mg total) by mouth 2 (two) times daily. - Oral    E-Prescribing Status: Receipt confirmed by pharmacy (12/21/2014 9:19 AM EDT)     Pharmacy    [id number] - [place] ([place]), [place] - [address]   WE DO NOT FILL THESE  KLOR-CON M20 20 MEQ tablet  Medication   Date: 02/12/2015  Department: [address] [facility]  Ordering/Authorizing: Thayer Headings, MD      Order Providers    Prescribing Provider Encounter Provider   Thayer Headings, MD Thayer Headings, MD    Medication Detail      Disp Refills Start End     KLOR-CON M20 20 MEQ tablet 30 tablet 11 02/12/2015     Sig: TAKE ONE TABLET BY MOUTH ONCE DAILY    E-Prescribing Status: Receipt confirmed by pharmacy (02/12/2015 3:25 PM EDT)     Pharmacy    [facility] [place] ([place]),  - [address]    WILL LET [facility] KNOW THESE ARE ON FILE AND THEY CAN MOVE THEM AND CHANGE TO 90/0

## 2015-10-16 NOTE — Telephone Encounter (Signed)
irbesartan (AVAPRO) 300 MG tablet  Medication   Date: 12/21/2014  Department: [facility]  Ordering/Authorizing: [name], MD      Order Providers    Prescribing Provider Encounter Provider   [name], MD [name], MD    Medication Detail      Disp Refills Start End     irbesartan (AVAPRO) 300 MG tablet 30 tablet 11 12/21/2014     Sig - Route: Take 1 tablet (300 mg total) by mouth daily. - Oral    E-Prescribing Status: Receipt confirmed by pharmacy (12/21/2014 9:19 AM EDT)     Pharmacy    [facility] [place] ([place]), [place] - [address]   rosuvastatin (CRESTOR) 5 MG tablet  Medication   Date: 12/21/2014  Department: [facility]  Ordering/Authorizing: [name], MD      Order Providers    Prescribing Provider Encounter Provider   [name], MD [name], MD    Medication Detail      Disp Refills Start End     rosuvastatin (CRESTOR) 5 MG tablet 30 tablet 11 12/21/2014     Sig - Route: Take 1 tablet (5 mg total) by mouth daily at 6 PM. - Oral    E-Prescribing Status: Receipt confirmed by pharmacy (12/21/2014 9:19 AM EDT)     Pharmacy    [facility] [place] ([place]), [place] - [address]   We dont fill these.

## 2015-10-17 ENCOUNTER — Other Ambulatory Visit: Payer: Self-pay | Admitting: *Deleted

## 2015-10-17 MED ORDER — METFORMIN HCL ER 500 MG PO TB24: 500.0000 mg | ORAL_TABLET | Freq: Two times a day (BID) | ORAL | Status: DC

## 2015-10-17 MED ORDER — POTASSIUM CHLORIDE CRYS ER 20 MEQ PO TBCR: 20.0000 meq | EXTENDED_RELEASE_TABLET | Freq: Every day | ORAL | Status: DC

## 2015-10-17 MED ORDER — CARVEDILOL 25 MG PO TABS: 25.0000 mg | ORAL_TABLET | Freq: Two times a day (BID) | ORAL | Status: DC

## 2015-10-17 MED ORDER — GLIPIZIDE ER 5 MG PO TB24: 5.0000 mg | ORAL_TABLET | Freq: Every day | ORAL | Status: DC

## 2015-10-17 MED ORDER — ROSUVASTATIN CALCIUM 5 MG PO TABS: 5.0000 mg | ORAL_TABLET | Freq: Every day | ORAL | Status: DC

## 2015-10-17 MED ORDER — GLUCOSE BLOOD VI STRP: ORAL_STRIP | Status: DC

## 2015-10-17 MED ORDER — IRBESARTAN 300 MG PO TABS: 300.0000 mg | ORAL_TABLET | Freq: Every day | ORAL | Status: DC

## 2015-10-17 MED ORDER — FLUTICASONE PROPIONATE 50 MCG/ACT NA SUSP: 2.0000 | Freq: Every day | NASAL | Status: DC

## 2015-10-22 ENCOUNTER — Telehealth: Payer: Self-pay | Admitting: Internal Medicine

## 2015-10-22 NOTE — Telephone Encounter (Signed)
It is ok Thx

## 2015-10-22 NOTE — Telephone Encounter (Signed)
Left mess for patient to call back to advise of below. Will call [name] at [facility] tomorrow to advise.

## 2015-10-22 NOTE — Telephone Encounter (Signed)
[name] [phone] called from [facility] regarding rosuvastatin (CRESTOR) 5 MG tablet pt reported a allergy to statin which this medication may contribute to that and  metFORMIN (GLUCOPHAGE-XR) 500 MG 24 hr tablet may be triggered to pt allergy. Ref number is [id number]. Thank you!

## 2015-10-23 NOTE — Telephone Encounter (Signed)
Audelia Acton, Pharmacist informed of below.

## 2015-10-24 ENCOUNTER — Telehealth: Payer: Self-pay | Admitting: Cardiovascular Disease

## 2015-10-24 NOTE — Telephone Encounter (Signed)
Spoke with patient who states she went 2 weeks without medications due to problem with Rx from [place].  I advised her to resume medications today.  She verbalized understanding and agreement and is aware of appointment with Dr. Acie Fredrickson on Monday.

## 2015-10-24 NOTE — Telephone Encounter (Signed)
NEW MESSAGE   Pt verbalized that she finally received her medication that was shipped to the house it has been 2 weeks   Ircesertan 300mg   carbedilol 25mg   Can she start taking it or should she wait until Monday's appt  Pt is waiting for rn call

## 2015-10-28 ENCOUNTER — Ambulatory Visit (INDEPENDENT_AMBULATORY_CARE_PROVIDER_SITE_OTHER): Payer: BLUE CROSS/BLUE SHIELD | Admitting: Cardiovascular Disease

## 2015-10-28 ENCOUNTER — Encounter: Payer: Self-pay | Admitting: Cardiovascular Disease

## 2015-10-28 VITALS — BP 150/90 | HR 69 | Ht 62.0 in | Wt 189.4 lb

## 2015-10-28 DIAGNOSIS — I5022 Chronic systolic (congestive) heart failure: Secondary | ICD-10-CM

## 2015-10-28 DIAGNOSIS — E785 Hyperlipidemia, unspecified: Secondary | ICD-10-CM

## 2015-10-28 DIAGNOSIS — I2510 Atherosclerotic heart disease of native coronary artery without angina pectoris: Secondary | ICD-10-CM | POA: Diagnosis not present

## 2015-10-28 LAB — COMPREHENSIVE METABOLIC PANEL
ALT: 16 U/L (ref 6–29)
AST: 12 U/L (ref 10–35)
Albumin: 4.2 g/dL (ref 3.6–5.1)
Alkaline Phosphatase: 104 U/L (ref 33–130)
BUN: 45 mg/dL — ABNORMAL HIGH (ref 7–25)
CO2: 23 mmol/L (ref 20–31)
Calcium: 9.5 mg/dL (ref 8.6–10.4)
Chloride: 109 mmol/L (ref 98–110)
Creat: 1.53 mg/dL — ABNORMAL HIGH (ref 0.50–0.99)
Glucose, Bld: 188 mg/dL — ABNORMAL HIGH (ref 65–99)
Potassium: 4.7 mmol/L (ref 3.5–5.3)
Sodium: 137 mmol/L (ref 135–146)
Total Bilirubin: 0.3 mg/dL (ref 0.2–1.2)
Total Protein: 6.8 g/dL (ref 6.1–8.1)

## 2015-10-28 LAB — LIPID PANEL
Cholesterol: 118 mg/dL — ABNORMAL LOW (ref 125–200)
HDL: 60 mg/dL (ref >=46)
LDL Cholesterol: 47 mg/dL (ref <130)
Total CHOL/HDL Ratio: 2 Ratio (ref <=5.0)
Triglycerides: 53 mg/dL (ref <150)
VLDL: 11 mg/dL (ref <30)

## 2015-10-28 NOTE — Patient Instructions (Addendum)
Medication Instructions:  Your physician recommends that you continue on your current medications as directed. Please refer to the Current Medication list given to you today.   Labwork: TODAY - cholesterol, complete metabolic panel  Your physician recommends that you return for lab work in: 6 months on the day of or a few days before your office visit with Dr. Acie Fredrickson.  You will need to FAST for this appointment - nothing to eat or drink after midnight the night before except water.   Testing/Procedures: None Ordered   Follow-Up: Your physician wants you to follow-up in: 6 months with Dr. Acie Fredrickson.  You will receive a reminder letter in the mail two months in advance. If you don't receive a letter, please call our office to schedule the follow-up appointment.   If you need a refill on your cardiac medications before your next appointment, please call your pharmacy.   Thank you for choosing [facility]! Christen Bame, RN [phone]

## 2015-10-28 NOTE — Progress Notes (Signed)
Cardiology Office Note   Date:  10/28/2015   ID:  [name], DOB 1951/03/20, MRN [id number]  PCP:  Walker Kehr, MD  Cardiologist:   Thayer Headings, MD   Chief Complaint  Patient presents with  . Follow-up    CAD , CHF   1. Coronary artery disease-status post PTCA and stenting of her mid LAD using a 3.5 x 15 mm Alpine stent ( DES) ( Dec. 5, 2014)  2. chronic systolic congestive heart failure-ejection fraction of 35% 2. Hypertension 3. COPD 4. Diabetes mellitus 5. History colon cancer  History of Present Illness:  [name] is seen today. She was initially seen by Dr. Mare Ferrari in the hospital. She has a history of chronic systolic congestive heart failure with an ejection fraction of around 35%. She has a history of coronary artery disease. She is status post PTCA and stenting of her mid left anterior descending artery using a 3.5 x 15 mm Alpine stent. She has a history of COPD. [name] not had a cigarette since he left the hospital. She also has a history of diabetes mellitus and history of colon cancer  She has mild interscapular pain this morning . And the pain resolved after she drank some club soda. Did not feel like her MI pain.  She has been walking every morining at the mall and at [place].   Her BP is up today. - she may have had lots of salt last night. The BP has remained high   September 20, 2013  [name] has been having some episodes of chest discomfort. She initially thought it might be due to indigestion or due to something that she ate. It radiated to her interscapular region. It was associated with some hand and normal tingling. The pain was intermittent but lasted for several hours. She took a sublingual nitroglycerin and the pain resolved very quickly and she did not have any further episodes the whole rest of the day.  She informed that she ran out of her Lasix 3 days ago.  November 01, 2013:  [name] is feeling well. She stopped smoking  the day of her cath. She is eating more "penny candy" to replace the habit.   January 31, 2014:  She is doing ok. She has no angina. Not exercising. 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  She has a bruise on her leg where she bumped into a table.   Oct. 29, 2015:  [name] is doing very well. She is not having any episodes of chest pain. [name] not exercising quite as much as she would like. Stent was placed in December of 2014. She had a nose bleed last week. ( she is on Effient until Dec. 2015). Her Bp was A bit elevated because of some salt in her diet.  No CP or dyspnea    November 02, 2014:  [name] is a 65 y.o. female who presents for follow-up of her coronary artery disease. [name] done fairly well. She has had some muscle pain and a rash. She has occasional episodes of dizziness. She had some non-cardiac CP several weeks ago when she started Trajenta.  Stopped the med and has not had any further episodes of CP.    Oct. 26, 2016  [name] is doing well She had a echo recently   Left ventricle: The cavity size was normal. Wall thickness was increased in a pattern of mild LVH with focal basal septal hypertrophy. Systolic function was normal. The estimated  ejection fraction was in the range of 60% to 65%. Wall motion was normal; there were no regional wall motion abnormalities. Doppler parameters are consistent with abnormal left ventricular relaxation (grade 1 diastolic dysfunction). - Aortic valve: There was no stenosis. There was trivial regurgitation. - Mitral valve: Mildly calcified annulus. Mildly calcified leaflets . There was no significant regurgitation. - Right ventricle: The cavity size was normal. Systolic function was normal. - Tricuspid valve: Peak RV-RA gradient (S): 21 mm Hg. - Pulmonary arteries: PA peak pressure: 24 mm Hg (S). - Inferior vena cava: The vessel was normal in size. The respirophasic diameter changes were in the  normal range (>= 50%), consistent with normal central venous pressure  [name] is doing well  BP has been elevated for the past 2 weeks.  Has had some left arm pain / ached Left arm aches all the time,  Constant.  Worse with sitting, not worsened by exercise .   October 28, 2015:  [name] is doing well.  She went with [facility] - it took 2 weeks for them to get her meds to her .  Has chronic left arm pain .  Past Medical History  Diagnosis Date  . Anemia   . Personal history of colon cancer   . Diabetes mellitus   . Hyperlipidemia   . Hypertension   . Low back pain   . Obesity   . COPD (chronic obstructive pulmonary disease) ([place])   . Renal insufficiency   . NSTEMI (non-ST elevated myocardial infarction) ([place]) 06/11/2013  . Ischemic cardiomyopathy 06/11/2013    Past Surgical History  Procedure Laterality Date  . Colectomy    . Left heart catheterization with coronary angiogram N/A 06/09/2013    Procedure: LEFT HEART CATHETERIZATION WITH CORONARY ANGIOGRAM;  Surgeon: Ramond Dial, MD;  Location: [facility] CATH LAB;  Service: Cardiovascular;  Laterality: N/A;     Current Outpatient Prescriptions  Medication Sig Dispense Refill  . amLODipine (NORVASC) 10 MG tablet Take 0.5 tablets (5 mg total) by mouth daily. 90 tablet 0  . aspirin 81 MG chewable tablet Chew 1 tablet (81 mg total) by mouth daily.    . calcium carbonate (OS-CAL) 600 MG TABS tablet Take 600 mg by mouth 2 (two) times daily with a meal.    . carvedilol (COREG) 25 MG tablet Take 1 tablet (25 mg total) by mouth 2 (two) times daily with a meal. 180 tablet 3  . fluticasone (FLONASE) 50 MCG/ACT nasal spray Place 2 sprays into both nostrils daily. 48 g 3  . furosemide (LASIX) 80 MG tablet Take 0.5 tablets (40 mg total) by mouth daily. 45 tablet 0  . glipiZIDE (GLUCOTROL XL) 5 MG 24 hr tablet Take 1 tablet (5 mg total) by mouth daily with breakfast. 90 tablet 3  . glucose blood (ONE TOUCH ULTRA TEST) test strip Use to test  blood sugar 2 times daily as instructed. Dx: E11.40 100 each 3  . irbesartan (AVAPRO) 300 MG tablet Take 1 tablet (300 mg total) by mouth daily. 90 tablet 3  . loratadine (CLARITIN) 10 MG tablet Take 1 tablet (10 mg total) by mouth daily. 100 tablet 3  . metFORMIN (GLUCOPHAGE-XR) 500 MG 24 hr tablet Take 1 tablet (500 mg total) by mouth 2 (two) times daily. 180 tablet 3  . nitroGLYCERIN (NITROSTAT) 0.4 MG SL tablet Place 1 tablet (0.4 mg total) under the tongue every 5 (five) minutes as needed for chest pain. 20 tablet 1  . potassium chloride  SA (KLOR-CON M20) 20 MEQ tablet Take 1 tablet (20 mEq total) by mouth daily. 90 tablet 3  . ranitidine (ZANTAC) 150 MG tablet Take 1 tablet (150 mg total) by mouth 2 (two) times daily. 60 tablet 11  . rosuvastatin (CRESTOR) 5 MG tablet Take 1 tablet (5 mg total) by mouth daily at 6 PM. 90 tablet 3  . traMADol-acetaminophen (ULTRACET) 37.5-325 MG tablet Take 0.5-1 tablets by mouth every 8 (eight) hours as needed for severe pain. 100 tablet 2  . vitamin B-12 (CYANOCOBALAMIN) 1000 MCG tablet Take 1,000 mcg by mouth daily.     No current facility-administered medications for this visit.    Allergies:   Tradjenta; Atorvastatin; Enalapril maleate; Wilder Glade; Hydrochlorothiazide; Kenalog; Metformin and related; Propoxyphene n-acetaminophen; Simvastatin; and Spironolactone    Social History:  The patient  reports that she has been smoking Cigarettes.  She has never used smokeless tobacco. She reports that she does not drink alcohol or use illicit drugs.   Family History:  The patient's family history is not on file.    ROS:  Please see the history of present illness.    Review of Systems: Constitutional:  denies fever, chills, diaphoresis, appetite change and fatigue.  HEENT: denies photophobia, eye pain, redness, hearing loss, ear pain, congestion, sore throat, rhinorrhea, sneezing, neck pain, neck stiffness and tinnitus.  Respiratory: denies SOB, DOE, cough,  chest tightness, and wheezing.  Cardiovascular: denies chest pain, palpitations and leg swelling.  Gastrointestinal: denies nausea, vomiting, abdominal pain, diarrhea, constipation, blood in stool.  Genitourinary: denies dysuria, urgency, frequency, hematuria, flank pain and difficulty urinating.  Musculoskeletal: denies  myalgias, back pain, joint swelling, arthralgias and gait problem.   Skin: denies pallor, rash and wound.  Neurological: denies dizziness, seizures, syncope, weakness, light-headedness, numbness and headaches.   Hematological: denies adenopathy, easy bruising, personal or family bleeding history.  Psychiatric/ Behavioral: denies suicidal ideation, mood changes, confusion, nervousness, sleep disturbance and agitation.       All other systems are reviewed and negative.    PHYSICAL EXAM: VS:  BP 150/90 mmHg  Pulse 69  Ht 5\' 2"  (1.575 m)  Wt 189 lb 6.4 oz (85.911 kg)  BMI 34.63 kg/m2 , BMI Body mass index is 34.63 kg/(m^2). GEN: Well nourished, well developed, in no acute distress HEENT: normal,  Mild thyromegaly  Neck: no JVD, carotid bruits, or masses Cardiac: RRR; no murmurs, rubs, or gallops,no edema  Respiratory:  clear to auscultation bilaterally, normal work of breathing GI: soft, nontender, nondistended, + BS MS: no deformity or atrophy Skin: warm and dry, no rash Neuro:  Strength and sensation are intact Psych: normal   EKG:  EKG is ordered today. The ekg ordered today demonstrates NSR at 69.  NS ST ab.    Recent Labs: 05/01/2015: BUN 31*; Creat 1.62*; Potassium 4.6; Sodium 139    Lipid Panel    Component Value Date/Time   CHOL 138 11/02/2014 1221   TRIG 100.0 11/02/2014 1221   HDL 40.90 11/02/2014 1221   CHOLHDL 3 11/02/2014 1221   VLDL 20.0 11/02/2014 1221   LDLCALC 77 11/02/2014 1221      Wt Readings from Last 3 Encounters:  10/28/15 189 lb 6.4 oz (85.911 kg)  09/20/15 182 lb 9.6 oz (82.827 kg)  09/16/15 185 lb (83.915 kg)       Other studies Reviewed: Additional studies/ records that were reviewed today include: . Review of the above records demonstrates:    ASSESSMENT AND PLAN:  1. Coronary artery disease-status  post PTCA and stenting of her mid LAD using a 3.5 x 15 mm Alpine stent ( DES) ( Dec. 5, 2014) she's not had any episodes of angina. We will continue aggressive treatment of her lipids. Continue other medications.   2. chronic systolic congestive heart failure- initial ejection fraction of 35% - she's currently on carvedilol and Avapro.  Her echo shows normal LV function.   [name] not having any symptoms of congestive heart failure. On Coreg to 25 BID   2. Hypertension -   Coreg to 25 BID  BP is typically well controlled.  Has been without her BP meds for 2 weeks   3. COPD 4. Diabetes mellitus 5. History colon cancer   Current medicines are reviewed at length with the patient today.  The patient does not have concerns regarding medicines.  The following changes have been made:  Increase coreg to 25 BID    Labs/ tests ordered today include:   No orders of the defined types were placed in this encounter.    Disposition:   FU with me in 6 months  .  Will get an echo in 6 months     Nahser, Wonda Cheng, MD  10/28/2015 10:55 AM    [address], [place], [place]  [address] Phone: [phone]; Fax: [phone]   [facility]  [address] [place], [place]  [address] [phone]    Fax [phone]

## 2015-11-06 ENCOUNTER — Other Ambulatory Visit: Payer: Self-pay | Admitting: Internal Medicine

## 2015-11-27 ENCOUNTER — Other Ambulatory Visit: Payer: Self-pay | Admitting: Internal Medicine

## 2015-12-17 ENCOUNTER — Ambulatory Visit (INDEPENDENT_AMBULATORY_CARE_PROVIDER_SITE_OTHER): Payer: BLUE CROSS/BLUE SHIELD | Admitting: Internal Medicine

## 2015-12-17 ENCOUNTER — Encounter: Payer: Self-pay | Admitting: Internal Medicine

## 2015-12-17 VITALS — BP 130/62 | HR 83 | Wt 185.0 lb

## 2015-12-17 DIAGNOSIS — E1159 Type 2 diabetes mellitus with other circulatory complications: Secondary | ICD-10-CM | POA: Diagnosis not present

## 2015-12-17 DIAGNOSIS — I2510 Atherosclerotic heart disease of native coronary artery without angina pectoris: Secondary | ICD-10-CM

## 2015-12-17 DIAGNOSIS — E1140 Type 2 diabetes mellitus with diabetic neuropathy, unspecified: Secondary | ICD-10-CM | POA: Diagnosis not present

## 2015-12-17 DIAGNOSIS — I10 Essential (primary) hypertension: Secondary | ICD-10-CM

## 2015-12-17 DIAGNOSIS — IMO0002 Reserved for concepts with insufficient information to code with codable children: Secondary | ICD-10-CM

## 2015-12-17 DIAGNOSIS — E1165 Type 2 diabetes mellitus with hyperglycemia: Secondary | ICD-10-CM

## 2015-12-17 NOTE — Progress Notes (Signed)
Pre visit review using our clinic review tool, if applicable. No additional management support is needed unless otherwise documented below in the visit note. 

## 2015-12-17 NOTE — Assessment & Plan Note (Signed)
Dr Acie Fredrickson Aspirin, Crestor

## 2015-12-17 NOTE — Assessment & Plan Note (Signed)
On Metformin, Crestor, Glipizide 

## 2015-12-17 NOTE — Progress Notes (Signed)
Subjective:  Patient ID: [name], female    DOB: 09-06-1950  Age: 65 y.o. MRN: [id number]  CC: No chief complaint on file.   HPI [name] presents for DM, HTN, OA f/u  Outpatient Prescriptions Prior to Visit  Medication Sig Dispense Refill  . amLODipine (NORVASC) 10 MG tablet Take 0.5 tablets (5 mg total) by mouth daily. 90 tablet 0  . aspirin 81 MG chewable tablet Chew 1 tablet (81 mg total) by mouth daily.    . calcium carbonate (OS-CAL) 600 MG TABS tablet Take 600 mg by mouth 2 (two) times daily with a meal.    . carvedilol (COREG) 25 MG tablet Take 1 tablet (25 mg total) by mouth 2 (two) times daily with a meal. 180 tablet 3  . fluticasone (FLONASE) 50 MCG/ACT nasal spray Place 2 sprays into both nostrils daily. 48 g 3  . furosemide (LASIX) 80 MG tablet Take one-half tablet by  mouth daily 45 tablet 3  . glipiZIDE (GLUCOTROL XL) 5 MG 24 hr tablet Take 1 tablet (5 mg total) by mouth daily with breakfast. 90 tablet 3  . glucose blood (ONE TOUCH ULTRA TEST) test strip Use to test blood sugar 2 times daily as instructed. Dx: E11.40 100 each 3  . irbesartan (AVAPRO) 300 MG tablet Take 1 tablet (300 mg total) by mouth daily. 90 tablet 3  . loratadine (CLARITIN) 10 MG tablet Take 1 tablet (10 mg total) by mouth daily. 100 tablet 3  . metFORMIN (GLUCOPHAGE-XR) 500 MG 24 hr tablet Take 1 tablet (500 mg total) by mouth 2 (two) times daily. 180 tablet 3  . nitroGLYCERIN (NITROSTAT) 0.4 MG SL tablet Dissolve 1 tablet under the tongue every 5 minutes as  needed for chest pain 50 tablet 1  . potassium chloride SA (KLOR-CON M20) 20 MEQ tablet Take 1 tablet (20 mEq total) by mouth daily. 90 tablet 3  . ranitidine (ZANTAC) 150 MG tablet Take 1 tablet (150 mg total) by mouth 2 (two) times daily. 60 tablet 11  . rosuvastatin (CRESTOR) 5 MG tablet Take 1 tablet (5 mg total) by mouth daily at 6 PM. 90 tablet 3  . traMADol-acetaminophen (ULTRACET) 37.5-325 MG tablet Take 0.5-1 tablets  by mouth every 8 (eight) hours as needed for severe pain. 100 tablet 2  . vitamin B-12 (CYANOCOBALAMIN) 1000 MCG tablet Take 1,000 mcg by mouth daily.     No facility-administered medications prior to visit.    ROS Review of Systems  Constitutional: Negative for chills, activity change, appetite change, fatigue and unexpected weight change.  HENT: Negative for congestion, mouth sores and sinus pressure.   Eyes: Negative for visual disturbance.  Respiratory: Negative for cough and chest tightness.   Cardiovascular: Positive for leg swelling.  Gastrointestinal: Negative for nausea and abdominal pain.  Genitourinary: Negative for frequency, difficulty urinating and vaginal pain.  Musculoskeletal: Positive for back pain and arthralgias. Negative for gait problem.  Skin: Negative for pallor and rash.  Neurological: Negative for dizziness, tremors, weakness, numbness and headaches.  Psychiatric/Behavioral: Negative for suicidal ideas, confusion and sleep disturbance. The patient is not nervous/anxious.     Objective:  BP 130/62 mmHg  Pulse 83  Wt 185 lb (83.915 kg)  SpO2 98%  BP Readings from Last 3 Encounters:  12/17/15 130/62  10/28/15 150/90  09/20/15 118/68    Wt Readings from Last 3 Encounters:  12/17/15 185 lb (83.915 kg)  10/28/15 189 lb 6.4 oz (85.911 kg)  09/20/15 182  lb 9.6 oz (82.827 kg)    Physical Exam  Constitutional: She appears well-developed. No distress.  HENT:  Head: Normocephalic.  Right Ear: External ear normal.  Left Ear: External ear normal.  Nose: Nose normal.  Mouth/Throat: Oropharynx is clear and moist.  Eyes: Conjunctivae are normal. Pupils are equal, round, and reactive to light. Right eye exhibits no discharge. Left eye exhibits no discharge.  Neck: Normal range of motion. Neck supple. No JVD present. No tracheal deviation present. No thyromegaly present.  Cardiovascular: Normal rate, regular rhythm and normal heart sounds.   Pulmonary/Chest:  No stridor. No respiratory distress. She has no wheezes.  Abdominal: Soft. Bowel sounds are normal. She exhibits no distension and no mass. There is no tenderness. There is no rebound and no guarding.  Musculoskeletal: She exhibits tenderness. She exhibits no edema.  Lymphadenopathy:    She has no cervical adenopathy.  Neurological: She displays normal reflexes. No cranial nerve deficit. She exhibits normal muscle tone. Coordination normal.  Skin: No rash noted. No erythema.  Psychiatric: She has a normal mood and affect. Her behavior is normal. Judgment and thought content normal.  LS tender Obese  Lab Results  Component Value Date   WBC 4.3 09/21/2014   HGB 11.9* 09/21/2014   HCT 34.3* 09/21/2014   PLT 131.0* 09/21/2014   GLUCOSE 188* 10/28/2015   CHOL 118* 10/28/2015   TRIG 53 10/28/2015   HDL 60 10/28/2015   LDLCALC 47 10/28/2015   ALT 16 10/28/2015   AST 12 10/28/2015   NA 137 10/28/2015   K 4.7 10/28/2015   CL 109 10/28/2015   CREATININE 1.53* 10/28/2015   BUN 45* 10/28/2015   CO2 23 10/28/2015   TSH 2.19 10/03/2013   INR 1.16 06/07/2013   HGBA1C 7.2 09/20/2015   MICROALBUR 13.9* 10/13/2007    US Soft Tissue Head/neck  10/19/2014  CLINICAL DATA:  Thyroid goiter on physical examination. EXAM: THYROID ULTRASOUND TECHNIQUE: Ultrasound examination of the thyroid gland and adjacent soft tissues was performed. COMPARISON:  None. FINDINGS: Right thyroid lobe Measurements: 4.0 x 1.5 x 1.6 cm. Heterogeneous parenchyma with multiple small cysts identified. The largest measures approximately 0.5 x 0.3 x 0.5 cm. All of the small cysts in the right lobe appears simple and benign. Left thyroid lobe Measurements: 4.5 x 1.4 x 2.0 cm. Heterogeneous parenchyma with multiple cysts. The largest measures approximately 1.0 x 0.4 x 0.5 cm. 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 Similar smaller cyst measures 0.8 cm in greatest diameter. There is a small solid  nodule measuring 0.7 x 0.4 x 0.6 cm. Isthmus Thickness: 0.4 cm.  No nodules visualized. Lymphadenopathy None visualized. IMPRESSION: Mildly prominent thyroid gland size without significant enlargement. Heterogeneous parenchyma with multiple small areas of nodularity identified bilaterally. 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 Only 1 solid nodule is identified in the left lobe measuring 0.7 cm. Electronically Signed   By: Aletta Edouard M.D.   On: 10/19/2014 10:53    Assessment & Plan:   There are no diagnoses linked to this encounter. I am having Ms. Hanover maintain her vitamin B-12, aspirin, calcium carbonate, ranitidine, loratadine, traMADol-acetaminophen, amLODipine, fluticasone, carvedilol, glipiZIDE, glucose blood, irbesartan, potassium chloride SA, metFORMIN, rosuvastatin, nitroGLYCERIN, and furosemide.  No orders of the defined types were placed in this encounter.     Follow-up: No Follow-up on file.  Walker Kehr, MD

## 2015-12-17 NOTE — Assessment & Plan Note (Signed)
Norvasc, Avapro, Coreg 

## 2015-12-26 ENCOUNTER — Ambulatory Visit: Payer: BLUE CROSS/BLUE SHIELD | Admitting: Internal Medicine

## 2016-02-08 LAB — HM DIABETES EYE EXAM

## 2016-02-25 ENCOUNTER — Encounter: Payer: Self-pay | Admitting: Internal Medicine

## 2016-02-25 ENCOUNTER — Ambulatory Visit (INDEPENDENT_AMBULATORY_CARE_PROVIDER_SITE_OTHER): Payer: Medicare Other | Admitting: Internal Medicine

## 2016-02-25 VITALS — BP 132/80 | HR 88 | Ht 62.0 in | Wt 188.0 lb

## 2016-02-25 DIAGNOSIS — E1140 Type 2 diabetes mellitus with diabetic neuropathy, unspecified: Secondary | ICD-10-CM

## 2016-02-25 DIAGNOSIS — E1165 Type 2 diabetes mellitus with hyperglycemia: Secondary | ICD-10-CM | POA: Diagnosis not present

## 2016-02-25 DIAGNOSIS — E049 Nontoxic goiter, unspecified: Secondary | ICD-10-CM

## 2016-02-25 DIAGNOSIS — IMO0002 Reserved for concepts with insufficient information to code with codable children: Secondary | ICD-10-CM

## 2016-02-25 LAB — POCT GLYCOSYLATED HEMOGLOBIN (HGB A1C): Hemoglobin A1C: 7.1

## 2016-02-25 NOTE — Patient Instructions (Signed)
Please continue Metformin XR 500 mg 2x a day. Continue Glipizide ER 5 mg before breakfast.  Please come back for a follow-up appointment in 3 months.

## 2016-02-25 NOTE — Progress Notes (Addendum)
Patient ID: [name], female   DOB: 10-31-1950, 65 y.o.   MRN: [id number]  HPI: [name] is a 65 y.o.-year-old female, returning for f/u DM2, dx in 2006, insulin-dependent, uncontrolled, with complications (CAD - s/p AMI 06/2013, CHF; CKD; PN). Last visit 5 mo ago.  Last hemoglobin A1c was: Lab Results  Component Value Date   HGBA1C 7.2 09/20/2015   HGBA1C 7.1 06/18/2015   HGBA1C 10.1 03/19/2015  Pt has refused insulin repeatedly in the past.  She started to change her diet after her hemoglobin A1c returned high, at 16.2% in 09/2014. She cut down portions, changed the meal content to include more fiber and less carbs. HbA1c decreased dramatically.  Pt is on: - Metformin ER 500 mg 2x a day - started 10/2014 - Glipizide ER 5 mg in am - started 03/2015 She tried Tradjenta 5 mg daily in am >> CP with it (started 10/2014) >> had to stop - she did see her cardiologist since then She tried [place] >> decreased [name] and yeast inf. She tried regular metformin >> diarrhea  Pt checks her sugars 2x a day and they are still wonderful: - am: 340-400s >> 76-125 >> 98-140, 160, 201 >> 79, 80-120, 132 >> 66, 69-109 >> 94-119 - 2h after b'fast: n/c  >> 95-136 - before lunch: n/c >> 79-130, 145 >> n/c >> 94-131 - 2h after lunch: n/c  - before dinner: n/c >> 86-122 >> n/c >> 85-116, 123 >> 88-120 >> 78, 90-130 - 2h after dinner: n/c >> 102-140 - bedtime: n/c >> 76-142 >> 90, 122-180, 199 >> n/c >> 95-143, 166 (fam reunion) - nighttime: n/c No lows. Lowest sugar was 300s >> 76 >> 90 >> 79 >> 66 >> 78; ? hypoglycemia awareness (does not feel CBGs in the 60s) Highest sugar was 573 >> 155 >> 199 >> 130 >> 166  Glucometer: One Touch Ultra  Pt's meals are: - Breakfast: oatmeal, cereals, [place] bacon, egg toast - Lunch: sandwich or fruit salad - Dinner: salmon/chicken, Brussel sprouts, other veggies, sweet potatoes or brown rice, spaghetti - Snacks: 2: carrots with dip; yoghurt with  fruit, salad at 10 pm, cereals after dinner  She walks for exercise.  - + CKD, last BUN/creatinine:  Lab Results  Component Value Date   BUN 45 (H) 10/28/2015   CREATININE 1.53 (H) 10/28/2015  On Irbesartan. - last set of lipids: Lab Results  Component Value Date   CHOL 118 (L) 10/28/2015   HDL 60 10/28/2015   LDLCALC 47 10/28/2015   TRIG 53 10/28/2015   CHOLHDL 2.0 10/28/2015  On Crestor 5 mg daily. - last eye exam was in 02/2015. No DR. + cataracts.  - + numbness and tingling in her feet. Has PN.   She also has a history of hypertension, GERD, and anemia.  She also has a goiter containing small cysts. No neck compression sxs.  ROS: Constitutional: + Weight gain, + cold intolerance and hot flushes Eyes: no blurry vision, no xerophthalmia ENT: no sore throat, no nodules palpated in throat, no dysphagia/odynophagia, no hoarseness Cardiovascular: no CP/SOB/palpitations/leg swelling Respiratory: + cough/no SOB Gastrointestinal: no N/V/D/+ C/ + acid reflux Musculoskeletal: + muscle aches /no joint aches Skin: + rash, + hair loss Neurological: no tremors/numbness/tingling/dizziness, no headaches  I reviewed pt's medications, allergies, PMH, social hx, family hx, and changes were documented in the history of present illness. Otherwise, unchanged from my initial visit note.  Past Medical History:  Diagnosis Date  . Anemia   .  COPD (chronic obstructive pulmonary disease) ([place])   . Diabetes mellitus   . Hyperlipidemia   . Hypertension   . Ischemic cardiomyopathy 06/11/2013  . Low back pain   . NSTEMI (non-ST elevated myocardial infarction) ([place]) 06/11/2013  . Obesity   . Personal history of colon cancer   . Renal insufficiency    Past Surgical History:  Procedure Laterality Date  . COLECTOMY    . LEFT HEART CATHETERIZATION WITH CORONARY ANGIOGRAM N/A 06/09/2013   Procedure: LEFT HEART CATHETERIZATION WITH CORONARY ANGIOGRAM;  Surgeon: Ramond Dial, MD;  Location: [facility]  CATH LAB;  Service: Cardiovascular;  Laterality: N/A;   History   Social History  . Marital Status: Single    Spouse Name: N/A  . Number of Children: 0   Occupational History  . retired   Social History Main Topics  . Smoking status:  former smoker     Types: Cigarettes  . Smokeless tobacco: Never Used  . Alcohol Use: No  . Drug Use: No   Current Outpatient Prescriptions on File Prior to Visit  Medication Sig Dispense Refill  . amLODipine (NORVASC) 10 MG tablet Take 0.5 tablets (5 mg total) by mouth daily. 90 tablet 0  . aspirin 81 MG chewable tablet Chew 1 tablet (81 mg total) by mouth daily.    . calcium carbonate (OS-CAL) 600 MG TABS tablet Take 600 mg by mouth 2 (two) times daily with a meal.    . carvedilol (COREG) 25 MG tablet Take 1 tablet (25 mg total) by mouth 2 (two) times daily with a meal. 180 tablet 3  . fluticasone (FLONASE) 50 MCG/ACT nasal spray Place 2 sprays into both nostrils daily. 48 g 3  . furosemide (LASIX) 80 MG tablet Take one-half tablet by  mouth daily 45 tablet 3  . glipiZIDE (GLUCOTROL XL) 5 MG 24 hr tablet Take 1 tablet (5 mg total) by mouth daily with breakfast. 90 tablet 3  . glucose blood (ONE TOUCH ULTRA TEST) test strip Use to test blood sugar 2 times daily as instructed. Dx: E11.40 100 each 3  . irbesartan (AVAPRO) 300 MG tablet Take 1 tablet (300 mg total) by mouth daily. 90 tablet 3  . loratadine (CLARITIN) 10 MG tablet Take 1 tablet (10 mg total) by mouth daily. 100 tablet 3  . metFORMIN (GLUCOPHAGE-XR) 500 MG 24 hr tablet Take 1 tablet (500 mg total) by mouth 2 (two) times daily. 180 tablet 3  . nitroGLYCERIN (NITROSTAT) 0.4 MG SL tablet Dissolve 1 tablet under the tongue every 5 minutes as  needed for chest pain 50 tablet 1  . potassium chloride SA (KLOR-CON M20) 20 MEQ tablet Take 1 tablet (20 mEq total) by mouth daily. 90 tablet 3  . ranitidine (ZANTAC) 150 MG tablet Take 1 tablet (150 mg total) by mouth 2 (two) times daily. 60 tablet 11   . rosuvastatin (CRESTOR) 5 MG tablet Take 1 tablet (5 mg total) by mouth daily at 6 PM. 90 tablet 3  . traMADol-acetaminophen (ULTRACET) 37.5-325 MG tablet Take 0.5-1 tablets by mouth every 8 (eight) hours as needed for severe pain. 100 tablet 2  . vitamin B-12 (CYANOCOBALAMIN) 1000 MCG tablet Take 1,000 mcg by mouth daily.     No current facility-administered medications on file prior to visit.    Allergies  Allergen Reactions  . Tradjenta [Linagliptin] Anaphylaxis    CP  . Atorvastatin     REACTION: aches and pains  . Enalapril Maleate  REACTION: cough  . Farxiga [Dapagliflozin] Itching  . Hydrochlorothiazide     REACTION: hair loss  . Kenalog [Triamcinolone Acetonide]     HANDS NUMB   . Metformin And Related     Diarrhea, dizziness  . Propoxyphene N-Acetaminophen Hives  . Simvastatin     REACTION: cramps  . Spironolactone     REACTION: cramps   Family History  Problem Relation Age of Onset  . Hypertension     PE: BP 132/80 (BP Location: Left Arm, Patient Position: Sitting)   Pulse 88   Ht 5\' 2"  (1.575 m)   Wt 188 lb (85.3 kg)   SpO2 98%   BMI 34.39 kg/m  Body mass index is 34.39 kg/m. Wt Readings from Last 3 Encounters:  02/25/16 188 lb (85.3 kg)  12/17/15 185 lb (83.9 kg)  10/28/15 189 lb 6.4 oz (85.9 kg)   Constitutional: overweight, in NAD Eyes: PERRLA, EOMI, no exophthalmos ENT: moist mucous membranes, + symmetric thyromegaly, no cervical lymphadenopathy Cardiovascular: RRR, + 1/6 SEM, no RG Respiratory: CTA B Gastrointestinal: abdomen soft, NT, ND, BS+ Musculoskeletal: no deformities, strength intact in all 4 Skin: moist, warm, no rashes Neurological: no tremor with outstretched hands, DTR normal in all 4  ASSESSMENT: 1. DM2, insulin-dependent (refusing insulin), uncontrolled, with complications - CAD, s/p AMI 06/2013 - Dr Acie Fredrickson - CHF - CKD - PN  2. Goiter - Reviewed latest TSH level: Lab Results  Component Value Date   TSH 2.19  10/03/2013  - Patient has a goiter at palpation - No neck compression symptoms - we obtained a thyroid ultrasound >> multiple small cysts >> no further investigation necessary    10/19/2014: thyroid ultrasound: Right thyroid lobe: 4.0 x 1.5 x 1.6 cm. Heterogeneous parenchyma with multiple small cysts identified. The largest measures approximately 0.5 x 0.3 x 0.5 cm. All of the small cysts in the right lobe appears simple and benign.  Left thyroid lobe: 4.5 x 1.4 x 2.0 cm. Heterogeneous parenchyma with multiple cysts. The largest measures approximately 1.0 x 0.4 x 0.5 cm. 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 Similar smaller cyst measures 0.8 cm in greatest diameter. There is a small solid nodule measuring 0.7 x 0.4 x 0.6 cm.  Isthmus Thickness: 0.4 cm. No nodules visualized.  Lymphadenopathy: None visualized.          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 No intervention needed.  PLAN:  1. Patient with long-standing, uncontrolled diabetes, with initially extremely high sugars, now [facility] improved with changing to Metformin ER and addition of Glipizide ER. She refused insulin in the past but has done well after starting po meds and changing her diet and now sugars are at goal!  - she only checks sugars before b'fast and before dinner >> all great! We checked hemoglobin A1c >> 7.1% (however, sugars in log are all at goal) >> I suggested a fructosamine level. - I suggested to:  Patient Instructions  Please continue Metformin XR 500 mg 2x a day. Continue Glipizide ER 5 mg before breakfast.  Please come back for a follow-up appointment in 3 months.  - continue checking sugars at different times of the day - check 1-2 times a day, rotating checks - advised for yearly eye exams - UTD, but planning to schedule a new one - refused flu shot in the past - Return to clinic in 3 mo with sugar log   2. Goiter - No neck compression symptoms - small thyroid cysts per  last  U/S in 10/2014 - no further f/u necessary  Office Visit on 02/25/2016  Component Date Value Ref Range Status  . Fructosamine 02/27/2016 293* 190 - 270 umol/L Final  . Hemoglobin A1C 02/25/2016 7.1   Final   The HbA1c calculated from the fructosamine is 6.1%, which is excellent!  Philemon Kingdom, MD PhD [facility]

## 2016-02-25 NOTE — Addendum Note (Signed)
Addended by: Caprice Beaver T on: 02/25/2016 01:35 PM   Modules accepted: Orders

## 2016-02-27 LAB — FRUCTOSAMINE: Fructosamine: 293 umol/L — ABNORMAL HIGH (ref 190–270)

## 2016-02-28 ENCOUNTER — Telehealth: Payer: Self-pay

## 2016-02-28 NOTE — Telephone Encounter (Signed)
Called patient and gave lab results. Patient had no questions or concerns.  

## 2016-03-05 ENCOUNTER — Other Ambulatory Visit: Payer: Self-pay | Admitting: Internal Medicine

## 2016-03-10 ENCOUNTER — Encounter: Payer: Self-pay | Admitting: Internal Medicine

## 2016-03-10 DIAGNOSIS — E119 Type 2 diabetes mellitus without complications: Secondary | ICD-10-CM | POA: Diagnosis not present

## 2016-03-10 DIAGNOSIS — H25013 Cortical age-related cataract, bilateral: Secondary | ICD-10-CM | POA: Diagnosis not present

## 2016-03-10 DIAGNOSIS — H35033 Hypertensive retinopathy, bilateral: Secondary | ICD-10-CM | POA: Diagnosis not present

## 2016-03-10 DIAGNOSIS — E113593 Type 2 diabetes mellitus with proliferative diabetic retinopathy without macular edema, bilateral: Secondary | ICD-10-CM | POA: Diagnosis not present

## 2016-03-10 DIAGNOSIS — H2513 Age-related nuclear cataract, bilateral: Secondary | ICD-10-CM | POA: Diagnosis not present

## 2016-03-10 LAB — HM DIABETES EYE EXAM

## 2016-03-17 DIAGNOSIS — H35033 Hypertensive retinopathy, bilateral: Secondary | ICD-10-CM | POA: Diagnosis not present

## 2016-03-17 DIAGNOSIS — E113412 Type 2 diabetes mellitus with severe nonproliferative diabetic retinopathy with macular edema, left eye: Secondary | ICD-10-CM | POA: Diagnosis not present

## 2016-03-17 DIAGNOSIS — H25013 Cortical age-related cataract, bilateral: Secondary | ICD-10-CM | POA: Diagnosis not present

## 2016-03-17 DIAGNOSIS — H4311 Vitreous hemorrhage, right eye: Secondary | ICD-10-CM | POA: Diagnosis not present

## 2016-03-17 DIAGNOSIS — E113511 Type 2 diabetes mellitus with proliferative diabetic retinopathy with macular edema, right eye: Secondary | ICD-10-CM | POA: Diagnosis not present

## 2016-03-30 DIAGNOSIS — E113412 Type 2 diabetes mellitus with severe nonproliferative diabetic retinopathy with macular edema, left eye: Secondary | ICD-10-CM | POA: Diagnosis not present

## 2016-03-30 DIAGNOSIS — E113511 Type 2 diabetes mellitus with proliferative diabetic retinopathy with macular edema, right eye: Secondary | ICD-10-CM | POA: Diagnosis not present

## 2016-04-13 ENCOUNTER — Ambulatory Visit (INDEPENDENT_AMBULATORY_CARE_PROVIDER_SITE_OTHER): Payer: BLUE CROSS/BLUE SHIELD | Admitting: Internal Medicine

## 2016-04-13 ENCOUNTER — Ambulatory Visit (INDEPENDENT_AMBULATORY_CARE_PROVIDER_SITE_OTHER)
Admission: RE | Admit: 2016-04-13 | Discharge: 2016-04-13 | Disposition: A | Discharge status: Home / Self Care | Payer: BLUE CROSS/BLUE SHIELD | Source: Ambulatory Visit | Attending: Internal Medicine | Admitting: Internal Medicine

## 2016-04-13 ENCOUNTER — Encounter: Payer: Self-pay | Admitting: Internal Medicine

## 2016-04-13 DIAGNOSIS — G8929 Other chronic pain: Secondary | ICD-10-CM

## 2016-04-13 DIAGNOSIS — E1140 Type 2 diabetes mellitus with diabetic neuropathy, unspecified: Secondary | ICD-10-CM | POA: Diagnosis not present

## 2016-04-13 DIAGNOSIS — IMO0002 Reserved for concepts with insufficient information to code with codable children: Secondary | ICD-10-CM

## 2016-04-13 DIAGNOSIS — J449 Chronic obstructive pulmonary disease, unspecified: Secondary | ICD-10-CM | POA: Diagnosis not present

## 2016-04-13 DIAGNOSIS — R05 Cough: Secondary | ICD-10-CM

## 2016-04-13 DIAGNOSIS — E1165 Type 2 diabetes mellitus with hyperglycemia: Secondary | ICD-10-CM

## 2016-04-13 DIAGNOSIS — M5440 Lumbago with sciatica, unspecified side: Secondary | ICD-10-CM

## 2016-04-13 DIAGNOSIS — R059 Cough, unspecified: Secondary | ICD-10-CM

## 2016-04-13 MED ORDER — BUDESONIDE-FORMOTEROL FUMARATE 160-4.5 MCG/ACT IN AERO: 2.0000 | INHALATION_SPRAY | Freq: Two times a day (BID) | RESPIRATORY_TRACT | 6 refills | Status: DC

## 2016-04-13 MED ORDER — NEBIVOLOL HCL 10 MG PO TABS: 10.0000 mg | ORAL_TABLET | Freq: Every day | ORAL | 11 refills | Status: DC

## 2016-04-13 NOTE — Assessment & Plan Note (Signed)
Ultracet prn  Potential benefits of a long term opioids use as well as potential risks (i.e. addiction risk, apnea etc) and complications (i.e. Somnolence, constipation and others) were explained to the patient and were aknowledged. 

## 2016-04-13 NOTE — Assessment & Plan Note (Addendum)
10/17 pt thinks it is due to Coreg: stop Coreg, start Bystolic Use MDI Stop spraying Lysol

## 2016-04-13 NOTE — Patient Instructions (Signed)
stop Coreg, start Bystolic

## 2016-04-13 NOTE — Progress Notes (Signed)
Pre visit review using our clinic review tool, if applicable. No additional management support is needed unless otherwise documented below in the visit note. 

## 2016-04-13 NOTE — Assessment & Plan Note (Signed)
On Metformin, Crestor, Glipizide

## 2016-04-13 NOTE — Assessment & Plan Note (Signed)
Symbicort 

## 2016-04-13 NOTE — Progress Notes (Signed)
Subjective:  Patient ID: [name], female    DOB: June 14, 1951  Age: 65 y.o. MRN: [id number]  CC: Follow-up (4 month follow up)   HPI MICKELLE GOUPIL presents for HTN, DM, LBP C/o cough since last visit - she thinks it is due to Coreg C/o cramps  Outpatient Medications Prior to Visit  Medication Sig Dispense Refill  . amLODipine (NORVASC) 10 MG tablet Take one-half tablet by  mouth daily 45 tablet 3  . aspirin 81 MG chewable tablet Chew 1 tablet (81 mg total) by mouth daily.    . calcium carbonate (OS-CAL) 600 MG TABS tablet Take 600 mg by mouth 2 (two) times daily with a meal.    . carvedilol (COREG) 25 MG tablet Take 1 tablet (25 mg total) by mouth 2 (two) times daily with a meal. 180 tablet 3  . fluticasone (FLONASE) 50 MCG/ACT nasal spray Place 2 sprays into both nostrils daily. 48 g 3  . furosemide (LASIX) 80 MG tablet Take one-half tablet by  mouth daily 45 tablet 3  . glipiZIDE (GLUCOTROL XL) 5 MG 24 hr tablet Take 1 tablet (5 mg total) by mouth daily with breakfast. 90 tablet 3  . glucose blood (ONE TOUCH ULTRA TEST) test strip Use to test blood sugar 2 times daily as instructed. Dx: E11.40 100 each 3  . irbesartan (AVAPRO) 300 MG tablet Take 1 tablet (300 mg total) by mouth daily. 90 tablet 3  . loratadine (CLARITIN) 10 MG tablet Take 1 tablet (10 mg total) by mouth daily. 100 tablet 3  . metFORMIN (GLUCOPHAGE-XR) 500 MG 24 hr tablet Take 1 tablet (500 mg total) by mouth 2 (two) times daily. 180 tablet 3  . nitroGLYCERIN (NITROSTAT) 0.4 MG SL tablet Dissolve 1 tablet under the tongue every 5 minutes as  needed for chest pain 50 tablet 1  . potassium chloride SA (KLOR-CON M20) 20 MEQ tablet Take 1 tablet (20 mEq total) by mouth daily. 90 tablet 3  . ranitidine (ZANTAC) 150 MG tablet Take 1 tablet (150 mg total) by mouth 2 (two) times daily. 60 tablet 11  . rosuvastatin (CRESTOR) 5 MG tablet Take 1 tablet (5 mg total) by mouth daily at 6 PM. 90 tablet 3  .  traMADol-acetaminophen (ULTRACET) 37.5-325 MG tablet Take 0.5-1 tablets by mouth every 8 (eight) hours as needed for severe pain. 100 tablet 2  . vitamin B-12 (CYANOCOBALAMIN) 1000 MCG tablet Take 1,000 mcg by mouth daily.     No facility-administered medications prior to visit.     ROS Review of Systems  Constitutional: Negative for activity change, appetite change, chills, fatigue and unexpected weight change.  HENT: Negative for congestion, mouth sores and sinus pressure.   Eyes: Negative for visual disturbance.  Respiratory: Positive for cough. Negative for chest tightness and wheezing.   Cardiovascular: Negative for leg swelling.  Gastrointestinal: Negative for abdominal pain and nausea.  Genitourinary: Negative for difficulty urinating, frequency and vaginal pain.  Musculoskeletal: Negative for back pain and gait problem.  Skin: Negative for pallor and rash.  Neurological: Negative for dizziness, tremors, weakness, numbness and headaches.  Psychiatric/Behavioral: Negative for confusion and sleep disturbance. The patient is nervous/anxious.     Objective:  BP 122/86 (BP Location: Left Arm, Patient Position: Sitting, Cuff Size: Normal)   Temp 97.8 F (36.6 C)   Ht 5\' 2"  (1.575 m)   Wt 194 lb (88 kg)   SpO2 96%   BMI 35.48 kg/m   BP Readings from  Last 3 Encounters:  04/13/16 122/86  02/25/16 132/80  12/17/15 130/62    Wt Readings from Last 3 Encounters:  04/13/16 194 lb (88 kg)  02/25/16 188 lb (85.3 kg)  12/17/15 185 lb (83.9 kg)    Physical Exam  Constitutional: She appears well-developed. No distress.  HENT:  Head: Normocephalic.  Right Ear: External ear normal.  Left Ear: External ear normal.  Nose: Nose normal.  Mouth/Throat: Oropharynx is clear and moist.  Eyes: Conjunctivae are normal. Pupils are equal, round, and reactive to light. Right eye exhibits no discharge. Left eye exhibits no discharge.  Neck: Normal range of motion. Neck supple. No JVD present.  No tracheal deviation present. No thyromegaly present.  Cardiovascular: Normal rate, regular rhythm and normal heart sounds.   Pulmonary/Chest: No stridor. No respiratory distress. She has no wheezes.  Abdominal: Soft. Bowel sounds are normal. She exhibits no distension and no mass. There is no tenderness. There is no rebound and no guarding.  Musculoskeletal: She exhibits no edema or tenderness.  Lymphadenopathy:    She has no cervical adenopathy.  Neurological: She displays normal reflexes. No cranial nerve deficit. She exhibits normal muscle tone. Coordination normal.  Skin: No rash noted. No erythema.  Psychiatric: She has a normal mood and affect. Her behavior is normal. Judgment and thought content normal.  coughing  Lab Results  Component Value Date   WBC 4.3 09/21/2014   HGB 11.9 (L) 09/21/2014   HCT 34.3 (L) 09/21/2014   PLT 131.0 (L) 09/21/2014   GLUCOSE 188 (H) 10/28/2015   CHOL 118 (L) 10/28/2015   TRIG 53 10/28/2015   HDL 60 10/28/2015   LDLCALC 47 10/28/2015   ALT 16 10/28/2015   AST 12 10/28/2015   NA 137 10/28/2015   K 4.7 10/28/2015   CL 109 10/28/2015   CREATININE 1.53 (H) 10/28/2015   BUN 45 (H) 10/28/2015   CO2 23 10/28/2015   TSH 2.19 10/03/2013   INR 1.16 06/07/2013   HGBA1C 7.1 02/25/2016   MICROALBUR 13.9 (H) 10/13/2007    US Soft Tissue Head/neck  Result Date: 10/19/2014 CLINICAL DATA:  Thyroid goiter on physical examination. EXAM: THYROID ULTRASOUND TECHNIQUE: Ultrasound examination of the thyroid gland and adjacent soft tissues was performed. COMPARISON:  None. FINDINGS: Right thyroid lobe Measurements: 4.0 x 1.5 x 1.6 cm. Heterogeneous parenchyma with multiple small cysts identified. The largest measures approximately 0.5 x 0.3 x 0.5 cm. All of the small cysts in the right lobe appears simple and benign. Left thyroid lobe Measurements: 4.5 x 1.4 x 2.0 cm. Heterogeneous parenchyma with multiple cysts. The largest measures approximately 1.0 x 0.4 x 0.5  cm. 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 Similar smaller cyst measures 0.8 cm in greatest diameter. There is a small solid nodule measuring 0.7 x 0.4 x 0.6 cm. Isthmus Thickness: 0.4 cm.  No nodules visualized. Lymphadenopathy None visualized. IMPRESSION: Mildly prominent thyroid gland size without significant enlargement. Heterogeneous parenchyma with multiple small areas of nodularity identified bilaterally. 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 Only 1 solid nodule is identified in the left lobe measuring 0.7 cm. Electronically Signed   By: Aletta Edouard M.D.   On: 10/19/2014 10:53    Assessment & Plan:   There are no diagnoses linked to this encounter. I am having Ms. Tempesta maintain her vitamin B-12, aspirin, calcium carbonate, ranitidine, loratadine, traMADol-acetaminophen, fluticasone, carvedilol, glipiZIDE, glucose blood, irbesartan, potassium chloride SA, metFORMIN, rosuvastatin, nitroGLYCERIN, furosemide, and  amLODipine.  No orders of the defined types were placed in this encounter.    Follow-up: No Follow-up on file.  Walker Kehr, MD

## 2016-04-15 ENCOUNTER — Other Ambulatory Visit: Payer: Self-pay | Admitting: Internal Medicine

## 2016-04-16 ENCOUNTER — Ambulatory Visit: Payer: Medicare Other | Admitting: Internal Medicine

## 2016-04-20 DIAGNOSIS — E113592 Type 2 diabetes mellitus with proliferative diabetic retinopathy without macular edema, left eye: Secondary | ICD-10-CM | POA: Diagnosis not present

## 2016-04-27 ENCOUNTER — Encounter (INDEPENDENT_AMBULATORY_CARE_PROVIDER_SITE_OTHER): Payer: Self-pay

## 2016-04-27 ENCOUNTER — Ambulatory Visit (INDEPENDENT_AMBULATORY_CARE_PROVIDER_SITE_OTHER): Payer: Medicare Other | Admitting: Cardiovascular Disease

## 2016-04-27 ENCOUNTER — Encounter: Payer: Self-pay | Admitting: Cardiovascular Disease

## 2016-04-27 VITALS — BP 144/76 | HR 68 | Ht 62.0 in | Wt 194.1 lb

## 2016-04-27 DIAGNOSIS — I5022 Chronic systolic (congestive) heart failure: Secondary | ICD-10-CM

## 2016-04-27 DIAGNOSIS — I10 Essential (primary) hypertension: Secondary | ICD-10-CM | POA: Diagnosis not present

## 2016-04-27 DIAGNOSIS — I2510 Atherosclerotic heart disease of native coronary artery without angina pectoris: Secondary | ICD-10-CM

## 2016-04-27 DIAGNOSIS — E113511 Type 2 diabetes mellitus with proliferative diabetic retinopathy with macular edema, right eye: Secondary | ICD-10-CM | POA: Diagnosis not present

## 2016-04-27 MED ORDER — VALSARTAN 320 MG PO TABS: 320.0000 mg | ORAL_TABLET | Freq: Every day | ORAL | 11 refills | Status: DC

## 2016-04-27 NOTE — Progress Notes (Signed)
Cardiology Office Note   Date:  04/27/2016   ID:  [name], DOB [birth year], MRN [id number]  PCP:  Walker Kehr, MD  Cardiologist:   Mertie Moores, MD   No chief complaint on file.  1. Coronary artery disease-status post PTCA and stenting of her mid LAD using a 3.5 x 15 mm Alpine stent ( DES) ( Dec. 5, 2014)  2. chronic systolic congestive heart failure-ejection fraction of 35% 2. Hypertension 3. COPD 4. Diabetes mellitus 5. History colon cancer    [name] is seen today. She was initially seen by Dr. Mare Ferrari in the hospital. She has a history of chronic systolic congestive heart failure with an ejection fraction of around 35%. She has a history of coronary artery disease. She is status post PTCA and stenting of her mid left anterior descending artery using a 3.5 x 15 mm Alpine stent. She has a history of COPD. [name] not had a cigarette since he left the hospital. She also has a history of diabetes mellitus and history of colon cancer  She has mild interscapular pain this morning . And the pain resolved after she drank some club soda. Did not feel like her MI pain.  She has been walking every morining at the mall and at [place].   Her BP is up today. - she may have had lots of salt last night. The BP has remained high   September 20, 2013  [name] has been having some episodes of chest discomfort. She initially thought it might be due to indigestion or due to something that she ate. It radiated to her interscapular region. It was associated with some hand and normal tingling. The pain was intermittent but lasted for several hours. She took a sublingual nitroglycerin and the pain resolved very quickly and she did not have any further episodes the whole rest of the day.  She informed that she ran out of her Lasix 3 days ago.  November 01, 2013:  [name] is feeling well. She stopped smoking the day of her cath. She is eating more "penny candy" to replace the  habit.   January 31, 2014:  She is doing ok. She has no angina. Not exercising. 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  She has a bruise on her leg where she bumped into a table.   Oct. 29, 2015:  [name] is doing very well. She is not having any episodes of chest pain. [name] not exercising quite as much as she would like. Stent was placed in December of 2014. She had a nose bleed last week. ( she is on Effient until Dec. 2015). Her Bp was A bit elevated because of some salt in her diet.  No CP or dyspnea    November 02, 2014:  [name] is a 65 y.o. female who presents for follow-up of her coronary artery disease. [name] done fairly well. She has had some muscle pain and a rash. She has occasional episodes of dizziness. She had some non-cardiac CP several weeks ago when she started Trajenta.  Stopped the med and has not had any further episodes of CP.    Oct. 26, 2016  [name] is doing well She had a echo recently   Left ventricle: The cavity size was normal. Wall thickness was increased in a pattern of mild LVH with focal basal septal hypertrophy. Systolic function was normal. The estimated ejection fraction was in the range of 60% to 65%. Wall motion was normal; there  were no regional wall motion abnormalities. Doppler parameters are consistent with abnormal left ventricular relaxation (grade 1 diastolic dysfunction). - Aortic valve: There was no stenosis. There was trivial regurgitation. - Mitral valve: Mildly calcified annulus. Mildly calcified leaflets . There was no significant regurgitation. - Right ventricle: The cavity size was normal. Systolic function was normal. - Tricuspid valve: Peak RV-RA gradient (S): 21 mm Hg. - Pulmonary arteries: PA peak pressure: 24 mm Hg (S). - Inferior vena cava: The vessel was normal in size. The respirophasic diameter changes were in the normal range (>= 50%), consistent with normal central venous  pressure  [name] is doing well  BP has been elevated for the past 2 weeks.  Has had some left arm pain / ached Left arm aches all the time,  Constant.  Worse with sitting, not worsened by exercise .   October 28, 2015:  [name] is doing well.  She went with [facility] - it took 2 weeks for them to get her meds to her .  Has chronic left arm pain .  Oct. 23, 2017:  Doing well from a cardiac standpoint Having eye surgery . Has had a chronic cough Walker Kehr, MD changed her Coreg to Bystolic Did not seem to help the cough     Past Medical History:  Diagnosis Date  . Anemia   . COPD (chronic obstructive pulmonary disease) ([place])   . Diabetes mellitus   . Hyperlipidemia   . Hypertension   . Ischemic cardiomyopathy 06/11/2013  . Low back pain   . NSTEMI (non-ST elevated myocardial infarction) ([place]) 06/11/2013  . Obesity   . Personal history of colon cancer   . Renal insufficiency     Past Surgical History:  Procedure Laterality Date  . COLECTOMY    . LEFT HEART CATHETERIZATION WITH CORONARY ANGIOGRAM N/A 06/09/2013   Procedure: LEFT HEART CATHETERIZATION WITH CORONARY ANGIOGRAM;  Surgeon: Ramond Dial, MD;  Location: [facility] C[facility];  Service: Cardiovascular;  Laterality: N/A;     Current Outpatient Prescriptions  Medication Sig Dispense Refill  . amLODipine (NORVASC) 10 MG tablet Take one-half tablet by  mouth daily 45 tablet 3  . aspirin 81 MG chewable tablet Chew 1 tablet (81 mg total) by mouth daily.    . calcium carbonate (OS-CAL) 600 MG TABS tablet Take 600 mg by mouth 2 (two) times daily with a meal.    . fluticasone (FLONASE) 50 MCG/ACT nasal spray Place 2 sprays into both nostrils daily. 48 g 3  . furosemide (LASIX) 80 MG tablet Take one-half tablet by  mouth daily 45 tablet 3  . glipiZIDE (GLUCOTROL XL) 5 MG 24 hr tablet Take 1 tablet (5 mg total) by mouth daily with breakfast. 90 tablet 3  . loratadine (CLARITIN) 10 MG tablet Take 1 tablet (10 mg total) by mouth  daily. 100 tablet 3  . metFORMIN (GLUCOPHAGE-XR) 500 MG 24 hr tablet Take 1 tablet (500 mg total) by mouth 2 (two) times daily. 180 tablet 3  . nebivolol (BYSTOLIC) 10 MG tablet Take 1 tablet (10 mg total) by mouth daily. 30 tablet 11  . nitroGLYCERIN (NITROSTAT) 0.4 MG SL tablet Dissolve 1 tablet under the tongue every 5 minutes as  needed for chest pain 50 tablet 1  . ONE TOUCH ULTRA TEST test strip USE TO TEST BLOOD SUGAR TWO TIMES A DAY AS DIRECTED 200 each 3  . potassium chloride SA (KLOR-CON M20) 20 MEQ tablet Take 1 tablet (20 mEq total) by  mouth daily. 90 tablet 3  . ranitidine (ZANTAC) 150 MG tablet Take 1 tablet (150 mg total) by mouth 2 (two) times daily. 60 tablet 11  . rosuvastatin (CRESTOR) 5 MG tablet Take 1 tablet (5 mg total) by mouth daily at 6 PM. (Patient taking differently: Take 5 mg by mouth every other day. ) 90 tablet 3  . traMADol-acetaminophen (ULTRACET) 37.5-325 MG tablet Take 0.5-1 tablets by mouth every 8 (eight) hours as needed for severe pain. 100 tablet 2  . vitamin B-12 (CYANOCOBALAMIN) 1000 MCG tablet Take 1,000 mcg by mouth daily.    . budesonide-formoterol (SYMBICORT) 160-4.5 MCG/ACT inhaler Inhale 2 puffs into the lungs 2 (two) times daily. (Patient not taking: Reported on 04/27/2016) 1 Inhaler 6   No current facility-administered medications for this visit.     Allergies:   Tradjenta [linagliptin]; Atorvastatin; Enalapril maleate; Farxiga [dapagliflozin]; Hydrochlorothiazide; Kenalog [triamcinolone acetonide]; Metformin and related; Propoxyphene n-acetaminophen; Simvastatin; and Spironolactone    Social History:  The patient  reports that she has been smoking Cigarettes.  She has never used smokeless tobacco. She reports that she does not drink alcohol or use drugs.   Family History:  The patient's family history is not on file.    ROS:  Please see the history of present illness.    Review of Systems: Constitutional:  denies fever, chills, diaphoresis,  appetite change and fatigue.  HEENT: denies photophobia, eye pain, redness, hearing loss, ear pain, congestion, sore throat, rhinorrhea, sneezing, neck pain, neck stiffness and tinnitus.  Respiratory: denies SOB, DOE, cough, chest tightness, and wheezing.  Cardiovascular: denies chest pain, palpitations and leg swelling.  Gastrointestinal: denies nausea, vomiting, abdominal pain, diarrhea, constipation, blood in stool.  Genitourinary: denies dysuria, urgency, frequency, hematuria, flank pain and difficulty urinating.  Musculoskeletal: denies  myalgias, back pain, joint swelling, arthralgias and gait problem.   Skin: denies pallor, rash and wound.  Neurological: denies dizziness, seizures, syncope, weakness, light-headedness, numbness and headaches.   Hematological: denies adenopathy, easy bruising, personal or family bleeding history.  Psychiatric/ Behavioral: denies suicidal ideation, mood changes, confusion, nervousness, sleep disturbance and agitation.       All other systems are reviewed and negative.    PHYSICAL EXAM: VS:  BP (!) 144/76 (BP Location: Right Arm, Patient Position: Sitting, Cuff Size: Large)   Pulse 68   Ht 5\' 2"  (1.575 m)   Wt 194 lb 1.9 oz (88.1 kg)   BMI 35.51 kg/m  , BMI Body mass index is 35.51 kg/m. GEN: Well nourished, well developed, in no acute distress  HEENT: normal,  Mild thyromegaly   Neck: no JVD, carotid bruits, or masses Cardiac: RRR; no murmurs, rubs, or gallops,no edema  Respiratory:  clear to auscultation bilaterally, normal work of breathing GI: soft, nontender, nondistended, + BS MS: no deformity or atrophy  Skin: warm and dry, no rash Neuro:  Strength and sensation are intact Psych: normal   EKG:  EKG is ordered today. The ekg ordered today demonstrates NSR at 69.  NS ST ab.    Recent Labs: 10/28/2015: ALT 16; BUN 45; Creat 1.53; Potassium 4.7; Sodium 137    Lipid Panel    Component Value Date/Time   CHOL 118 (L) 10/28/2015 1114    TRIG 53 10/28/2015 1114   HDL 60 10/28/2015 1114   CHOLHDL 2.0 10/28/2015 1114   VLDL 11 10/28/2015 1114   LDLCALC 47 10/28/2015 1114      Wt Readings from Last 3 Encounters:  04/27/16 194 lb 1.9 oz (  88.1 kg)  04/13/16 194 lb (88 kg)  02/25/16 188 lb (85.3 kg)      Other studies Reviewed: Additional studies/ records that were reviewed today include: . Review of the above records demonstrates:    ASSESSMENT AND PLAN:  1. Coronary artery disease-status post PTCA and stenting of her mid LAD using a 3.5 x 15 mm Alpine stent ( DES) ( Dec. 5, 2014) she's not had any episodes of angina. We will continue aggressive treatment of her lipids. Continue other medications.   2. chronic systolic congestive heart failure- initial ejection fraction of 35% - her ejection fraction has now normalized. Her blood pressure remains elevated. We will discontinue the Avapro and start her on valsartan 320 mg a day. We'll check fasting basic metabolic profile, lipid profile and liver enzymes in 3 weeks. I'll see her in 3 months.  2. Hypertension -   On Bystolic instead of Coreg.   The change did not seem to help her cough Will change the Avapro to Valsartan 320 to see if that improves her BP control.    3. COPD 4. Diabetes mellitus 5. History colon cancer   Current medicines are reviewed at length with the patient today.  The patient does not have concerns regarding medicines.  The following changes have been made:  Increase coreg to 25 BID    Labs/ tests ordered today include:   No orders of the defined types were placed in this encounter.       Mertie Moores, MD  04/27/2016 9:53 AM    [address], [place], [place]  [address] Phone: [phone]; Fax: [phone]   [facility]  [address] [place], [place]  [address] [phone]    Fax [phone]

## 2016-04-27 NOTE — Patient Instructions (Signed)
Medication Instructions:  STOP Irbesartan START Valsartan 320 mg once daily   Labwork: Your physician recommends that you return for lab work in: 3 weeks for cholesterol and complete metabolic panel You will need to FAST for this appointment - nothing to eat or drink after midnight the night before except water.   Testing/Procedures: None Ordered   Follow-Up: Your physician recommends that you schedule a follow-up appointment in: 3 months with Dr. Acie Fredrickson   If you need a refill on your cardiac medications before your next appointment, please call your pharmacy.   Thank you for choosing [facility]! Christen Bame, RN [phone]

## 2016-05-04 DIAGNOSIS — E113412 Type 2 diabetes mellitus with severe nonproliferative diabetic retinopathy with macular edema, left eye: Secondary | ICD-10-CM | POA: Diagnosis not present

## 2016-05-11 ENCOUNTER — Telehealth: Payer: Self-pay | Admitting: *Deleted

## 2016-05-11 DIAGNOSIS — E113511 Type 2 diabetes mellitus with proliferative diabetic retinopathy with macular edema, right eye: Secondary | ICD-10-CM | POA: Diagnosis not present

## 2016-05-11 NOTE — Telephone Encounter (Signed)
Left mess for patient to call back.  I need to know if pt wants forms from [employer] completed by PCP for therapeutic inserts and a Lumbar support brace. I have forms on my desk, but PCP wanted me to check with pt prior to faxing forms back.

## 2016-05-12 NOTE — Telephone Encounter (Signed)
Pt called you back. Stated she does not need this done anymore. So no need to fill forms out and fax them. Thanks.

## 2016-05-12 NOTE — Telephone Encounter (Signed)
Noted. Will shred forms.

## 2016-05-18 ENCOUNTER — Other Ambulatory Visit: Payer: Medicare Other | Admitting: *Deleted

## 2016-05-18 DIAGNOSIS — I5022 Chronic systolic (congestive) heart failure: Secondary | ICD-10-CM

## 2016-05-18 DIAGNOSIS — I10 Essential (primary) hypertension: Secondary | ICD-10-CM

## 2016-05-18 DIAGNOSIS — I2510 Atherosclerotic heart disease of native coronary artery without angina pectoris: Secondary | ICD-10-CM | POA: Diagnosis not present

## 2016-05-18 LAB — COMPREHENSIVE METABOLIC PANEL
ALT: 11 U/L (ref 6–29)
AST: 13 U/L (ref 10–35)
Albumin: 4 g/dL (ref 3.6–5.1)
Alkaline Phosphatase: 93 U/L (ref 33–130)
BUN: 51 mg/dL — ABNORMAL HIGH (ref 7–25)
CO2: 23 mmol/L (ref 20–31)
Calcium: 9.5 mg/dL (ref 8.6–10.4)
Chloride: 108 mmol/L (ref 98–110)
Creat: 1.93 mg/dL — ABNORMAL HIGH (ref 0.50–0.99)
Glucose, Bld: 107 mg/dL — ABNORMAL HIGH (ref 65–99)
Potassium: 4.7 mmol/L (ref 3.5–5.3)
Sodium: 139 mmol/L (ref 135–146)
Total Bilirubin: 0.3 mg/dL (ref 0.2–1.2)
Total Protein: 6.8 g/dL (ref 6.1–8.1)

## 2016-05-18 LAB — LIPID PANEL
Cholesterol: 121 mg/dL (ref <200)
HDL: 60 mg/dL (ref >50)
LDL Cholesterol: 51 mg/dL (ref <100)
Total CHOL/HDL Ratio: 2 Ratio (ref <5.0)
Triglycerides: 48 mg/dL (ref <150)
VLDL: 10 mg/dL (ref <30)

## 2016-05-19 ENCOUNTER — Other Ambulatory Visit: Payer: Self-pay | Admitting: *Deleted

## 2016-05-19 DIAGNOSIS — I10 Essential (primary) hypertension: Secondary | ICD-10-CM

## 2016-05-25 DIAGNOSIS — E113591 Type 2 diabetes mellitus with proliferative diabetic retinopathy without macular edema, right eye: Secondary | ICD-10-CM | POA: Diagnosis not present

## 2016-05-27 ENCOUNTER — Encounter: Payer: Self-pay | Admitting: Internal Medicine

## 2016-05-27 ENCOUNTER — Ambulatory Visit (INDEPENDENT_AMBULATORY_CARE_PROVIDER_SITE_OTHER): Payer: Medicare Other | Admitting: Internal Medicine

## 2016-05-27 VITALS — BP 110/88 | HR 88 | Ht 62.0 in | Wt 196.2 lb

## 2016-05-27 DIAGNOSIS — E049 Nontoxic goiter, unspecified: Secondary | ICD-10-CM

## 2016-05-27 DIAGNOSIS — E1159 Type 2 diabetes mellitus with other circulatory complications: Secondary | ICD-10-CM | POA: Diagnosis not present

## 2016-05-27 DIAGNOSIS — E1165 Type 2 diabetes mellitus with hyperglycemia: Secondary | ICD-10-CM | POA: Diagnosis not present

## 2016-05-27 DIAGNOSIS — IMO0002 Reserved for concepts with insufficient information to code with codable children: Secondary | ICD-10-CM

## 2016-05-27 DIAGNOSIS — E1140 Type 2 diabetes mellitus with diabetic neuropathy, unspecified: Secondary | ICD-10-CM

## 2016-05-27 LAB — POCT GLYCOSYLATED HEMOGLOBIN (HGB A1C): Hemoglobin A1C: 6.7

## 2016-05-27 NOTE — Patient Instructions (Addendum)
Please continue Metformin XR 500 mg 2x a day. Continue Glipizide ER 5 mg before breakfast.  Please come back for a follow-up appointment in 4 months.

## 2016-05-27 NOTE — Progress Notes (Signed)
Patient ID: [name], female   DOB: [birth year], 65 y.o.   MRN: [id number]  HPI: [name] is a 65 y.o.-year-old female, returning for f/u DM2, dx in 2006, insulin-independent, controlled, with complications (CAD - s/p AMI 06/2013, CHF; CKD; PN; DR). Last visit 3 mo ago.  Last hemoglobin A1c was: Lab Results  Component Value Date   HGBA1C 6.7 05/27/2016   HGBA1C 7.1 02/25/2016   HGBA1C 7.2 09/20/2015  Pt has refused insulin repeatedly in the past.  She started to change her diet after her hemoglobin A1c returned high, at 16.2% in 09/2014. She cut down portions, changed the meal content to include more fiber and less carbs. HbA1c decreased dramatically.  Pt is on: - Metformin ER 500 mg 2x a day - started 10/2014 - Glipizide ER 5 mg in am - started 03/2015 She tried Tradjenta 5 mg daily in am >> CP with it (started 10/2014) >> had to stop - she did see her cardiologist since then She tried [place] >> decreased [name] and yeast inf. She tried regular metformin >> diarrhea  Pt checks her sugars 2x a day and they are still wonderful: - am: 340-400s >> 76-125 >> 98-140, 160, 201 >> 79, 80-120, 132 >> 66, 69-109 >> 94-119 >> 94-113 - 2h after b'fast: n/c  >> 95-136 >> 91-118, 122 - before lunch: n/c >> 79-130, 145 >> n/c >> 94-131 >> 114 - 2h after lunch: n/c >> 122, 127 - before dinner: n/c >> 86-122 >> n/c >> 85-116, 123 >> 88-120 >> 78, 90-130 >> 90-130 - 2h after dinner: n/c >> 102-140 >> 110-147 - bedtime: n/c >> 76-142 >> 90, 122-180, 199 >> n/c >> 95-143, 166 (fam reunion) >> 99, 114 - nighttime: n/c No lows. Lowest sugar was 78 >> 90; ? hypoglycemia awareness (does not feel CBGs in the 60s) Highest sugar was 166 >> 147.  Glucometer: One Touch Ultra  Pt's meals are: - Breakfast: oatmeal, cereals, [place] bacon, egg toast - Lunch: sandwich or fruit salad - Dinner: salmon/chicken, Brussel sprouts, other veggies, sweet potatoes or brown rice, spaghetti - Snacks: 2:  carrots with dip; yoghurt with fruit, salad at 10 pm, cereals after dinner She walks for exercise.  - + CKD, last BUN/creatinine:  Lab Results  Component Value Date   BUN 51 (H) 05/18/2016   CREATININE 1.93 (H) 05/18/2016  On Irbesartan. - last set of lipids: Lab Results  Component Value Date   CHOL 121 05/18/2016   HDL 60 05/18/2016   LDLCALC 51 05/18/2016   TRIG 48 05/18/2016   CHOLHDL 2.0 05/18/2016  On Crestor 5 mg daily. - last eye exam was in 02/08/2016. + DR. + cataracts.  - + numbness and tingling in her feet. Has PN.   She also has a history of hypertension, GERD, and anemia.  She also has a goiter containing small cysts. No neck compression sxs.  Her cough is better after changing from Coreg to Bystolic.  ROS: Constitutional: + Weight gain, + cold intolerance  Eyes: no blurry vision, no xerophthalmia ENT: no sore throat, no nodules palpated in throat, no dysphagia/odynophagia, no hoarseness Cardiovascular: no CP/SOB/palpitations/leg swelling Respiratory: + cough/no SOB Gastrointestinal: no N/V/D/C/acid reflux Musculoskeletal: no muscle aches /no joint aches Skin: no rash, + hair loss Neurological: no tremors/numbness/tingling/dizziness, no headaches  I reviewed pt's medications, allergies, PMH, social hx, family hx, and changes were documented in the history of present illness. Otherwise, unchanged from my initial visit note.  Past Medical  History:  Diagnosis Date  . Anemia   . COPD (chronic obstructive pulmonary disease) ([place])   . Diabetes mellitus   . Hyperlipidemia   . Hypertension   . Ischemic cardiomyopathy 06/11/2013  . Low back pain   . NSTEMI (non-ST elevated myocardial infarction) ([place]) 06/11/2013  . Obesity   . Personal history of colon cancer   . Renal insufficiency    Past Surgical History:  Procedure Laterality Date  . COLECTOMY    . LEFT HEART CATHETERIZATION WITH CORONARY ANGIOGRAM N/A 06/09/2013   Procedure: LEFT HEART CATHETERIZATION  WITH CORONARY ANGIOGRAM;  Surgeon: Ramond Dial, MD;  Location: [facility] CATH LAB;  Service: Cardiovascular;  Laterality: N/A;   History   Social History  . Marital Status: Single    Spouse Name: N/A  . Number of Children: 0   Occupational History  . retired   Social History Main Topics  . Smoking status:  former smoker     Types: Cigarettes  . Smokeless tobacco: Never Used  . Alcohol Use: No  . Drug Use: No   Current Outpatient Prescriptions on File Prior to Visit  Medication Sig Dispense Refill  . amLODipine (NORVASC) 10 MG tablet Take one-half tablet by  mouth daily 45 tablet 3  . aspirin 81 MG chewable tablet Chew 1 tablet (81 mg total) by mouth daily.    . calcium carbonate (OS-CAL) 600 MG TABS tablet Take 600 mg by mouth 2 (two) times daily with a meal.    . fluticasone (FLONASE) 50 MCG/ACT nasal spray Place 2 sprays into both nostrils daily. 48 g 3  . furosemide (LASIX) 80 MG tablet Take one-half tablet by  mouth daily 45 tablet 3  . glipiZIDE (GLUCOTROL XL) 5 MG 24 hr tablet Take 1 tablet (5 mg total) by mouth daily with breakfast. 90 tablet 3  . loratadine (CLARITIN) 10 MG tablet Take 1 tablet (10 mg total) by mouth daily. 100 tablet 3  . metFORMIN (GLUCOPHAGE-XR) 500 MG 24 hr tablet Take 1 tablet (500 mg total) by mouth 2 (two) times daily. 180 tablet 3  . nebivolol (BYSTOLIC) 10 MG tablet Take 1 tablet (10 mg total) by mouth daily. 30 tablet 11  . nitroGLYCERIN (NITROSTAT) 0.4 MG SL tablet Dissolve 1 tablet under the tongue every 5 minutes as  needed for chest pain 50 tablet 1  . ONE TOUCH ULTRA TEST test strip USE TO TEST BLOOD SUGAR TWO TIMES A DAY AS DIRECTED 200 each 3  . potassium chloride SA (KLOR-CON M20) 20 MEQ tablet Take 1 tablet (20 mEq total) by mouth daily. 90 tablet 3  . ranitidine (ZANTAC) 150 MG tablet Take 1 tablet (150 mg total) by mouth 2 (two) times daily. 60 tablet 11  . rosuvastatin (CRESTOR) 5 MG tablet Take 5 mg by mouth every other day.    .  traMADol-acetaminophen (ULTRACET) 37.5-325 MG tablet Take 0.5-1 tablets by mouth every 8 (eight) hours as needed for severe pain. 100 tablet 2  . valsartan (DIOVAN) 320 MG tablet Take 1 tablet (320 mg total) by mouth daily. 30 tablet 11  . vitamin B-12 (CYANOCOBALAMIN) 1000 MCG tablet Take 1,000 mcg by mouth daily.     No current facility-administered medications on file prior to visit.    Allergies  Allergen Reactions  . Tradjenta [Linagliptin] Anaphylaxis    CP  . Atorvastatin     REACTION: aches and pains  . Enalapril Maleate     REACTION: cough  . [place] [Dapagliflozin]  Itching  . Hydrochlorothiazide     REACTION: hair loss  . Kenalog [Triamcinolone Acetonide]     HANDS NUMB   . Metformin And Related     Diarrhea, dizziness  . Propoxyphene N-Acetaminophen Hives  . Simvastatin     REACTION: cramps  . Spironolactone     REACTION: cramps   Family History  Problem Relation Age of Onset  . Hypertension     PE: BP 110/88   Pulse 88   Ht 5\' 2"  (1.575 m)   Wt 196 lb 3.2 oz (89 kg)   SpO2 99%   BMI 35.89 kg/m  Body mass index is 35.89 kg/m. Wt Readings from Last 3 Encounters:  05/27/16 196 lb 3.2 oz (89 kg)  04/27/16 194 lb 1.9 oz (88.1 kg)  04/13/16 194 lb (88 kg)   Constitutional: overweight, in NAD Eyes: PERRLA, EOMI, no exophthalmos ENT: moist mucous membranes, + symmetric thyromegaly, no cervical lymphadenopathy Cardiovascular: RRR, + 1/6 SEM, no RG Respiratory: CTA B Gastrointestinal: abdomen soft, NT, ND, BS+ Musculoskeletal: no deformities, strength intact in all 4 Skin: moist, warm, no rashes Neurological: no tremor with outstretched hands, DTR normal in all 4  ASSESSMENT: 1. DM2, insulin-independent, controlled, with complications - CAD, s/p AMI 06/2013 - Dr Acie Fredrickson - CHF - CKD - PN - DR - Dr. Jalene Mullet ([facility]) - on IO injections >> improving  2. Goiter - No neck compression symptoms  - 10/19/2014: thyroid ultrasound: Right  thyroid lobe: 4.0 x 1.5 x 1.6 cm. Heterogeneous parenchyma with multiple small cysts identified. The largest measures approximately 0.5 x 0.3 x 0.5 cm. All of the small cysts in the right lobe appears simple and benign.  Left thyroid lobe: 4.5 x 1.4 x 2.0 cm. Heterogeneous parenchyma with multiple cysts. The largest measures approximately 1.0 x 0.4 x 0.5 cm. 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 Similar smaller cyst measures 0.8 cm in greatest diameter. There is a small solid nodule measuring 0.7 x 0.4 x 0.6 cm.  Isthmus Thickness: 0.4 cm. No nodules visualized.  Lymphadenopathy: None visualized.          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 No intervention needed.  PLAN:  1. Patient with long-standing, prev. uncontrolled diabetes, with initially extremely high sugars, now great CBGs after changing to Metformin ER, addition of Glipizide ER, and changing her diet. She refused insulin in the past but has done great after starting po meds and changing her diet >> no insulin needed. - We checked hemoglobin A1c >> 6.7% - I suggested to:  Patient Instructions  Please continue Metformin XR 500 mg 2x a day. Continue Glipizide ER 5 mg before breakfast.  Please come back for a follow-up appointment in 4 months.  - continue checking sugars at different times of the day - check 1-2 times a day, rotating checks - advised for yearly eye exams - UTD - refuses flu shot today - Return to clinic in 4 mo with sugar log   2. Goiter - No neck compression symptoms - small thyroid cysts per last U/S in 10/2014 - again discussed that no further f/u is necessary unless she develops neck compression sxs  Philemon Kingdom, MD PhD [facility] Endocrinology

## 2016-06-25 ENCOUNTER — Other Ambulatory Visit: Payer: Medicare Other | Admitting: *Deleted

## 2016-06-25 DIAGNOSIS — I10 Essential (primary) hypertension: Secondary | ICD-10-CM | POA: Diagnosis not present

## 2016-06-26 ENCOUNTER — Telehealth: Payer: Self-pay | Admitting: Nurse Practitioner

## 2016-06-26 LAB — BASIC METABOLIC PANEL
BUN: 48 mg/dL — ABNORMAL HIGH (ref 7–25)
CO2: 19 mmol/L — ABNORMAL LOW (ref 20–31)
Calcium: 9.5 mg/dL (ref 8.6–10.4)
Chloride: 105 mmol/L (ref 98–110)
Creat: 2.25 mg/dL — ABNORMAL HIGH (ref 0.50–0.99)
Glucose, Bld: 113 mg/dL — ABNORMAL HIGH (ref 65–99)
Potassium: 5.1 mmol/L (ref 3.5–5.3)
Sodium: 137 mmol/L (ref 135–146)

## 2016-06-26 MED ORDER — VALSARTAN 160 MG PO TABS: 160.0000 mg | ORAL_TABLET | Freq: Every day | ORAL | 11 refills | Status: DC

## 2016-06-26 NOTE — Telephone Encounter (Signed)
-----   Message from Thayer Headings, MD sent at 06/26/2016 12:52 PM EST ----- Renal function is slightly worse.   Decrease Valsartan to 160 and recheck in 3-4 weeks.

## 2016-06-26 NOTE — Telephone Encounter (Signed)
Spoke with patient to review lab results and Dr. Elmarie Shiley advice to decrease valsartan to 160 mg daily.  She is scheduled for office visit on 1/24 with Dr. Acie Fredrickson and I advised we will recheck bmet at that appointment. She states she is drinking 6 bottles of water per day and wants to know if she needs to continue. I advised that she drink at least 64 oz water daily.  She verbalized understanding and agreement with plan and thanked me for the call.

## 2016-07-16 ENCOUNTER — Ambulatory Visit (INDEPENDENT_AMBULATORY_CARE_PROVIDER_SITE_OTHER): Payer: Medicare Other | Admitting: Internal Medicine

## 2016-07-16 ENCOUNTER — Encounter: Payer: Self-pay | Admitting: Internal Medicine

## 2016-07-16 DIAGNOSIS — I5022 Chronic systolic (congestive) heart failure: Secondary | ICD-10-CM

## 2016-07-16 DIAGNOSIS — M5440 Lumbago with sciatica, unspecified side: Secondary | ICD-10-CM

## 2016-07-16 DIAGNOSIS — I2510 Atherosclerotic heart disease of native coronary artery without angina pectoris: Secondary | ICD-10-CM | POA: Diagnosis not present

## 2016-07-16 DIAGNOSIS — G8929 Other chronic pain: Secondary | ICD-10-CM

## 2016-07-16 DIAGNOSIS — E1159 Type 2 diabetes mellitus with other circulatory complications: Secondary | ICD-10-CM

## 2016-07-16 DIAGNOSIS — I10 Essential (primary) hypertension: Secondary | ICD-10-CM

## 2016-07-16 MED ORDER — RANITIDINE HCL 150 MG PO TABS: 150.0000 mg | ORAL_TABLET | Freq: Two times a day (BID) | ORAL | 11 refills | Status: DC

## 2016-07-16 NOTE — Progress Notes (Signed)
Pre visit review using our clinic review tool, if applicable. No additional management support is needed unless otherwise documented below in the visit note. 

## 2016-07-16 NOTE — Assessment & Plan Note (Signed)
  Cont w/Furosemide, Coreg, Avapro

## 2016-07-16 NOTE — Assessment & Plan Note (Signed)
Tramadol prn 

## 2016-07-16 NOTE — Assessment & Plan Note (Signed)
F/u Dr Acie Fredrickson Cont w/Aspirin, Crestor

## 2016-07-16 NOTE — Progress Notes (Signed)
Subjective:  Patient ID: [name], female    DOB: Jul 13, 1950  Age: 66 y.o. MRN: [id number]  CC: No chief complaint on file.   HPI NIKITHA MODE presents for DM, HTN, CAD f/u  Outpatient Medications Prior to Visit  Medication Sig Dispense Refill  . amLODipine (NORVASC) 10 MG tablet Take one-half tablet by  mouth daily 45 tablet 3  . aspirin 81 MG chewable tablet Chew 1 tablet (81 mg total) by mouth daily.    . calcium carbonate (OS-CAL) 600 MG TABS tablet Take 600 mg by mouth 2 (two) times daily with a meal.    . fluticasone (FLONASE) 50 MCG/ACT nasal spray Place 2 sprays into both nostrils daily. 48 g 3  . furosemide (LASIX) 80 MG tablet Take one-half tablet by  mouth daily 45 tablet 3  . glipiZIDE (GLUCOTROL XL) 5 MG 24 hr tablet Take 1 tablet (5 mg total) by mouth daily with breakfast. 90 tablet 3  . loratadine (CLARITIN) 10 MG tablet Take 1 tablet (10 mg total) by mouth daily. 100 tablet 3  . metFORMIN (GLUCOPHAGE-XR) 500 MG 24 hr tablet Take 1 tablet (500 mg total) by mouth 2 (two) times daily. 180 tablet 3  . nebivolol (BYSTOLIC) 10 MG tablet Take 1 tablet (10 mg total) by mouth daily. 30 tablet 11  . nitroGLYCERIN (NITROSTAT) 0.4 MG SL tablet Dissolve 1 tablet under the tongue every 5 minutes as  needed for chest pain 50 tablet 1  . ONE TOUCH ULTRA TEST test strip USE TO TEST BLOOD SUGAR TWO TIMES A DAY AS DIRECTED 200 each 3  . potassium chloride SA (KLOR-CON M20) 20 MEQ tablet Take 1 tablet (20 mEq total) by mouth daily. 90 tablet 3  . ranitidine (ZANTAC) 150 MG tablet Take 1 tablet (150 mg total) by mouth 2 (two) times daily. 60 tablet 11  . rosuvastatin (CRESTOR) 5 MG tablet Take 5 mg by mouth every other day.    . traMADol-acetaminophen (ULTRACET) 37.5-325 MG tablet Take 0.5-1 tablets by mouth every 8 (eight) hours as needed for severe pain. 100 tablet 2  . valsartan (DIOVAN) 160 MG tablet Take 1 tablet (160 mg total) by mouth daily. 30 tablet 11  . vitamin B-12  (CYANOCOBALAMIN) 1000 MCG tablet Take 1,000 mcg by mouth daily.     No facility-administered medications prior to visit.     ROS Review of Systems  Constitutional: Positive for fatigue. Negative for activity change, appetite change, chills and unexpected weight change.  HENT: Negative for congestion, mouth sores and sinus pressure.   Eyes: Negative for visual disturbance.  Respiratory: Negative for cough and chest tightness.   Gastrointestinal: Negative for abdominal pain and nausea.  Genitourinary: Negative for difficulty urinating, frequency and vaginal pain.  Musculoskeletal: Positive for back pain. Negative for gait problem.  Skin: Negative for pallor and rash.  Neurological: Negative for dizziness, tremors, weakness, numbness and headaches.  Psychiatric/Behavioral: Negative for confusion, sleep disturbance and suicidal ideas.    Objective:  BP 130/62   Pulse 76   Wt 197 lb (89.4 kg)   SpO2 98%   BMI 36.03 kg/m   BP Readings from Last 3 Encounters:  07/16/16 130/62  05/27/16 110/88  04/27/16 (!) 144/76    Wt Readings from Last 3 Encounters:  07/16/16 197 lb (89.4 kg)  05/27/16 196 lb 3.2 oz (89 kg)  04/27/16 194 lb 1.9 oz (88.1 kg)    Physical Exam  Constitutional: She appears well-developed. No distress.  HENT:  Head: Normocephalic.  Right Ear: External ear normal.  Left Ear: External ear normal.  Nose: Nose normal.  Mouth/Throat: Oropharynx is clear and moist.  Eyes: Conjunctivae are normal. Pupils are equal, round, and reactive to light. Right eye exhibits no discharge. Left eye exhibits no discharge.  Neck: Normal range of motion. Neck supple. No JVD present. No tracheal deviation present. No thyromegaly present.  Cardiovascular: Normal rate, regular rhythm and normal heart sounds.   Pulmonary/Chest: No stridor. No respiratory distress. She has no wheezes.  Abdominal: Soft. Bowel sounds are normal. She exhibits no distension and no mass. There is no  tenderness. There is no rebound and no guarding.  Musculoskeletal: She exhibits tenderness. She exhibits no edema.  Lymphadenopathy:    She has no cervical adenopathy.  Neurological: She displays normal reflexes. No cranial nerve deficit. She exhibits normal muscle tone. Coordination normal.  Skin: No rash noted. No erythema.  Psychiatric: She has a normal mood and affect. Her behavior is normal. Judgment and thought content normal.  obese LS tender   Lab Results  Component Value Date   WBC 4.3 09/21/2014   HGB 11.9 (L) 09/21/2014   HCT 34.3 (L) 09/21/2014   PLT 131.0 (L) 09/21/2014   GLUCOSE 113 (H) 06/25/2016   CHOL 121 05/18/2016   TRIG 48 05/18/2016   HDL 60 05/18/2016   LDLCALC 51 05/18/2016   ALT 11 05/18/2016   AST 13 05/18/2016   NA 137 06/25/2016   K 5.1 06/25/2016   CL 105 06/25/2016   CREATININE 2.25 (H) 06/25/2016   BUN 48 (H) 06/25/2016   CO2 19 (L) 06/25/2016   TSH 2.19 10/03/2013   INR 1.16 06/07/2013   HGBA1C 6.7 05/27/2016   MICROALBUR 13.9 (H) 10/13/2007    Dg Chest 2 View  Result Date: 04/13/2016 CLINICAL DATA:  Productive cough for several months, initial encounter EXAM: CHEST  2 VIEW COMPARISON:  06/11/2013 FINDINGS: Cardiac shadow is within normal limits. The lungs are well aerated bilaterally. No focal infiltrate or sizable effusion is noted. No bony abnormality is seen. IMPRESSION: No acute abnormality noted. Electronically Signed   By: Inez Catalina M.D.   On: 04/13/2016 11:03    Assessment & Plan:   There are no diagnoses linked to this encounter. I am having Ms. Sgroi maintain her vitamin B-12, aspirin, calcium carbonate, ranitidine, loratadine, traMADol-acetaminophen, fluticasone, glipiZIDE, potassium chloride SA, metFORMIN, nitroGLYCERIN, furosemide, amLODipine, nebivolol, ONE TOUCH ULTRA TEST, rosuvastatin, and valsartan.  No orders of the defined types were placed in this encounter.    Follow-up: No Follow-up on file.  Walker Kehr, MD

## 2016-07-16 NOTE — Assessment & Plan Note (Signed)
Metformin, Glipizide

## 2016-07-16 NOTE — Patient Instructions (Signed)
Try Magnesium, Ultracet for cramps

## 2016-07-16 NOTE — Assessment & Plan Note (Signed)
Norvasc, Avapro, Coreg

## 2016-07-28 DIAGNOSIS — H43811 Vitreous degeneration, right eye: Secondary | ICD-10-CM | POA: Diagnosis not present

## 2016-07-28 DIAGNOSIS — E113511 Type 2 diabetes mellitus with proliferative diabetic retinopathy with macular edema, right eye: Secondary | ICD-10-CM | POA: Diagnosis not present

## 2016-07-28 DIAGNOSIS — H2513 Age-related nuclear cataract, bilateral: Secondary | ICD-10-CM | POA: Diagnosis not present

## 2016-07-28 DIAGNOSIS — H04123 Dry eye syndrome of bilateral lacrimal glands: Secondary | ICD-10-CM | POA: Diagnosis not present

## 2016-07-28 DIAGNOSIS — E113512 Type 2 diabetes mellitus with proliferative diabetic retinopathy with macular edema, left eye: Secondary | ICD-10-CM | POA: Diagnosis not present

## 2016-07-29 ENCOUNTER — Ambulatory Visit (INDEPENDENT_AMBULATORY_CARE_PROVIDER_SITE_OTHER): Payer: Medicare Other | Admitting: Cardiovascular Disease

## 2016-07-29 ENCOUNTER — Encounter: Payer: Self-pay | Admitting: Cardiovascular Disease

## 2016-07-29 VITALS — BP 140/76 | HR 88 | Ht 62.0 in | Wt 196.1 lb

## 2016-07-29 DIAGNOSIS — I5022 Chronic systolic (congestive) heart failure: Secondary | ICD-10-CM

## 2016-07-29 DIAGNOSIS — I2510 Atherosclerotic heart disease of native coronary artery without angina pectoris: Secondary | ICD-10-CM

## 2016-07-29 DIAGNOSIS — I10 Essential (primary) hypertension: Secondary | ICD-10-CM | POA: Diagnosis not present

## 2016-07-29 NOTE — Patient Instructions (Signed)
Medication Instructions:  Your physician recommends that you continue on your current medications as directed. Please refer to the Current Medication list given to you today.   Labwork: TODAY - basic metabolic panel  Your physician recommends that you return for lab work in: 6 months on the day of or a few days before your office visit with Dr. Acie Fredrickson.  You will need to FAST for this appointment - nothing to eat or drink after midnight the night before except water.   Testing/Procedures: None Ordered   Follow-Up: Your physician wants you to follow-up in: 6 months with Dr. Acie Fredrickson.  You will receive a reminder letter in the mail two months in advance. If you don't receive a letter, please call our office to schedule the follow-up appointment.   If you need a refill on your cardiac medications before your next appointment, please call your pharmacy.   Thank you for choosing [facility]! Christen Bame, RN [phone]

## 2016-07-29 NOTE — Progress Notes (Signed)
Cardiology Office Note   Date:  07/29/2016   ID:  [name], DOB June 22, 1951, MRN [id number]  PCP:  Walker Kehr, MD  Cardiologist:   Mertie Moores, MD   Chief Complaint  Patient presents with  . Follow-up    CHF   1. Coronary artery disease-status post PTCA and stenting of her mid LAD using a 3.5 x 15 mm Alpine stent ( DES) ( Dec. 5, 2014)  2. chronic systolic congestive heart failure-ejection fraction of 35% 2. Hypertension 3. COPD 4. Diabetes mellitus 5. History colon cancer    [name] is seen today. She was initially seen by Dr. Mare Ferrari in the hospital. She has a history of chronic systolic congestive heart failure with an ejection fraction of around 35%. She has a history of coronary artery disease. She is status post PTCA and stenting of her mid left anterior descending artery using a 3.5 x 15 mm Alpine stent. She has a history of COPD. [name] not had a cigarette since he left the hospital. She also has a history of diabetes mellitus and history of colon cancer  She has mild interscapular pain this morning . And the pain resolved after she drank some club soda. Did not feel like her MI pain.  She has been walking every morining at the mall and at [place].   Her BP is up today. - she may have had lots of salt last night. The BP has remained high   September 20, 2013  [name] has been having some episodes of chest discomfort. She initially thought it might be due to indigestion or due to something that she ate. It radiated to her interscapular region. It was associated with some hand and normal tingling. The pain was intermittent but lasted for several hours. She took a sublingual nitroglycerin and the pain resolved very quickly and she did not have any further episodes the whole rest of the day.  She informed that she ran out of her Lasix 3 days ago.  November 01, 2013:  [name] is feeling well. She stopped smoking the day of her cath. She is eating  more "penny candy" to replace the habit.   January 31, 2014:  She is doing ok. She has no angina. Not exercising. 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  She has a bruise on her leg where she bumped into a table.   Oct. 29, 2015:  [name] is doing very well. She is not having any episodes of chest pain. [name] not exercising quite as much as she would like. Stent was placed in December of 2014. She had a nose bleed last week. ( she is on Effient until Dec. 2015). Her Bp was A bit elevated because of some salt in her diet.  No CP or dyspnea    November 02, 2014:  [name] is a 65 y.o. female who presents for follow-up of her coronary artery disease. [name] done fairly well. She has had some muscle pain and a rash. She has occasional episodes of dizziness. She had some non-cardiac CP several weeks ago when she started Trajenta.  Stopped the med and has not had any further episodes of CP.    Oct. 26, 2016  [name] is doing well She had a echo recently   Left ventricle: The cavity size was normal. Wall thickness was increased in a pattern of mild LVH with focal basal septal hypertrophy. Systolic function was normal. The estimated ejection fraction was in the range  of 60% to 65%. Wall motion was normal; there were no regional wall motion abnormalities. Doppler parameters are consistent with abnormal left ventricular relaxation (grade 1 diastolic dysfunction). - Aortic valve: There was no stenosis. There was trivial regurgitation. - Mitral valve: Mildly calcified annulus. Mildly calcified leaflets . There was no significant regurgitation. - Right ventricle: The cavity size was normal. Systolic function was normal. - Tricuspid valve: Peak RV-RA gradient (S): 21 mm Hg. - Pulmonary arteries: PA peak pressure: 24 mm Hg (S). - Inferior vena cava: The vessel was normal in size. The respirophasic diameter changes were in the normal range (>= 50%), consistent  with normal central venous pressure  [name] is doing well  BP has been elevated for the past 2 weeks.  Has had some left arm pain / ached Left arm aches all the time,  Constant.  Worse with sitting, not worsened by exercise .   October 28, 2015:  [name] is doing well.  She went with [facility] - it took 2 weeks for them to get her meds to her .  Has chronic left arm pain .  Oct. 23, 2017:  Doing well from a cardiac standpoint Having eye surgery . Has had a chronic cough Walker Kehr, MD changed her Coreg to [facility] Did not seem to help the cough   Jan. 24, 2018:  doing well. Still eating some salty foods.  Still smoking some,  Advised her to quit.  Echo from 2016 shows normal LV systolic function Needs to have an contrast imaging study of her eye. She is at low risk for that procedure.   Past Medical History:  Diagnosis Date  . Anemia   . COPD (chronic obstructive pulmonary disease) ([place])   . Diabetes mellitus   . Hyperlipidemia   . Hypertension   . Ischemic cardiomyopathy 06/11/2013  . Low back pain   . NSTEMI (non-ST elevated myocardial infarction) ([place]) 06/11/2013  . Obesity   . Personal history of colon cancer   . Renal insufficiency     Past Surgical History:  Procedure Laterality Date  . COLECTOMY    . LEFT HEART CATHETERIZATION WITH CORONARY ANGIOGRAM N/A 06/09/2013   Procedure: LEFT HEART CATHETERIZATION WITH CORONARY ANGIOGRAM;  Surgeon: Ramond Dial, MD;  Location: [facility] CATH LAB;  Service: Cardiovascular;  Laterality: N/A;     Current Outpatient Prescriptions  Medication Sig Dispense Refill  . amLODipine (NORVASC) 10 MG tablet Take one-half tablet by  mouth daily 45 tablet 3  . aspirin 81 MG chewable tablet Chew 1 tablet (81 mg total) by mouth daily.    . calcium carbonate (OS-CAL) 600 MG TABS tablet Take 600 mg by mouth 2 (two) times daily with a meal.    . fluticasone (FLONASE) 50 MCG/ACT nasal spray Place 2 sprays into both nostrils daily. 48 g 3    . furosemide (LASIX) 80 MG tablet Take one-half tablet by  mouth daily 45 tablet 3  . glipiZIDE (GLUCOTROL XL) 5 MG 24 hr tablet Take 1 tablet (5 mg total) by mouth daily with breakfast. 90 tablet 3  . loratadine (CLARITIN) 10 MG tablet Take 1 tablet (10 mg total) by mouth daily. 100 tablet 3  . metFORMIN (GLUCOPHAGE-XR) 500 MG 24 hr tablet Take 1 tablet (500 mg total) by mouth 2 (two) times daily. 180 tablet 3  . nebivolol (BYSTOLIC) 10 MG tablet Take 1 tablet (10 mg total) by mouth daily. 30 tablet 11  . nitroGLYCERIN (NITROSTAT) 0.4 MG SL tablet  Dissolve 1 tablet under the tongue every 5 minutes as  needed for chest pain 50 tablet 1  . ONE TOUCH ULTRA TEST test strip USE TO TEST BLOOD SUGAR TWO TIMES A DAY AS DIRECTED 200 each 3  . potassium chloride SA (KLOR-CON M20) 20 MEQ tablet Take 1 tablet (20 mEq total) by mouth daily. 90 tablet 3  . ranitidine (ZANTAC) 150 MG tablet Take 1 tablet (150 mg total) by mouth 2 (two) times daily. 60 tablet 11  . rosuvastatin (CRESTOR) 5 MG tablet Take 5 mg by mouth every other day.    . traMADol-acetaminophen (ULTRACET) 37.5-325 MG tablet Take 0.5-1 tablets by mouth every 8 (eight) hours as needed for severe pain. 100 tablet 2  . valsartan (DIOVAN) 160 MG tablet Take 1 tablet (160 mg total) by mouth daily. 30 tablet 11  . vitamin B-12 (CYANOCOBALAMIN) 1000 MCG tablet Take 1,000 mcg by mouth daily.     No current facility-administered medications for this visit.     Allergies:   Tradjenta [linagliptin]; Atorvastatin; Enalapril maleate; Farxiga [dapagliflozin]; Hydrochlorothiazide; Kenalog [triamcinolone acetonide]; Metformin and related; Propoxyphene n-acetaminophen; Simvastatin; and Spironolactone    Social History:  The patient  reports that she has been smoking Cigarettes.  She has never used smokeless tobacco. She reports that she does not drink alcohol or use drugs.   Family History:  The patient's family history is not on file.    ROS:  Please  see the history of present illness.    Review of Systems: Constitutional:  denies fever, chills, diaphoresis, appetite change and fatigue.  HEENT: denies photophobia, eye pain, redness, hearing loss, ear pain, congestion, sore throat, rhinorrhea, sneezing, neck pain, neck stiffness and tinnitus.  Respiratory: denies SOB, DOE, cough, chest tightness, and wheezing.  Cardiovascular: denies chest pain, palpitations and leg swelling.  Gastrointestinal: denies nausea, vomiting, abdominal pain, diarrhea, constipation, blood in stool.  Genitourinary: denies dysuria, urgency, frequency, hematuria, flank pain and difficulty urinating.  Musculoskeletal: denies  myalgias, back pain, joint swelling, arthralgias and gait problem.   Skin: denies pallor, rash and wound.  Neurological: denies dizziness, seizures, syncope, weakness, light-headedness, numbness and headaches.   Hematological: denies adenopathy, easy bruising, personal or family bleeding history.  Psychiatric/ Behavioral: denies suicidal ideation, mood changes, confusion, nervousness, sleep disturbance and agitation.       All other systems are reviewed and negative.    PHYSICAL EXAM: VS:  BP 140/76 (BP Location: Right Arm, Patient Position: Sitting, Cuff Size: Large)   Pulse 88   Ht _0  (1.575 m)   Wt 196 lb 1.9 oz (89 kg)   BMI 35.87 kg/m  , BMI Body mass index is 35.87 kg/m. GEN: Well nourished, well developed, in no acute distress  HEENT: normal,  Mild thyromegaly   Neck: no JVD, carotid bruits, or masses Cardiac: RRR; no murmurs, rubs, or gallops,no edema  Respiratory:  clear to auscultation bilaterally, normal work of breathing GI: soft, nontender, nondistended, + BS MS: no deformity or atrophy  Skin: warm and dry, no rash Neuro:  Strength and sensation are intact Psych: normal   EKG:  EKG is ordered today. The ekg ordered today demonstrates NSR at 69.  NS ST ab.    Recent Labs: 05/18/2016: ALT 11 06/25/2016: BUN  48; Creat 2.25; Potassium 5.1; Sodium 137    Lipid Panel    Component Value Date/Time   CHOL 121 05/18/2016 1005   TRIG 48 05/18/2016 1005   HDL 60 05/18/2016 1005   CHOLHDL  2.0 05/18/2016 1005   VLDL 10 05/18/2016 1005   LDLCALC 51 05/18/2016 1005      Wt Readings from Last 3 Encounters:  07/29/16 196 lb 1.9 oz (89 kg)  07/16/16 197 lb (89.4 kg)  05/27/16 196 lb 3.2 oz (89 kg)      Other studies Reviewed: Additional studies/ records that were reviewed today include: . Review of the above records demonstrates:    ASSESSMENT AND PLAN:  1. Coronary artery disease-status post PTCA and stenting of her mid LAD using a 3.5 x 15 mm Alpine stent ( DES) ( Dec. 5, 2014) she's not had any episodes of angina. We will continue aggressive treatment of her lipids. Continue other medications.   2. chronic systolic congestive heart failure- initial ejection fraction of 35% - her ejection fraction has now normalized. Her blood pressure remains elevated. We will discontinue the Avapro and start her on valsartan 320 mg a day. We'll check fasting basic metabolic profile, lipid profile and liver enzymes in 3 weeks. I'll see her in 3 months.  2. Hypertension -   On Bystolic instead of Coreg.   The change did not seem to help her cough   We had changed her ARB to Valsartan 320 mg but this caused some increase of her creatinine.   We lowered the dose to Valsartan 160 mg . Will check BMP today   3. COPD 4. Diabetes mellitus 5. History colon cancer   Current medicines are reviewed at length with the patient today.  The patient does not have concerns regarding medicines.  The following changes have been made:  Increase coreg to 25 BID    Labs/ tests ordered today include:   Orders Placed This Encounter  Procedures  . Basic Metabolic Panel (BMET)  . Lipid Profile  . Comp Met (CMET)        Mertie Moores, MD  07/29/2016 10:10 AM    [facility] [address],  [place], [place]  [address] Phone: [phone]; Fax: [phone]

## 2016-07-30 LAB — BASIC METABOLIC PANEL
BUN/Creatinine Ratio: 22 (ref 12–28)
BUN: 50 mg/dL — ABNORMAL HIGH (ref 8–27)
CO2: 20 mmol/L (ref 18–29)
Calcium: 9.6 mg/dL (ref 8.7–10.3)
Chloride: 105 mmol/L (ref 96–106)
Creatinine, Ser: 2.25 mg/dL — ABNORMAL HIGH (ref 0.57–1.00)
GFR calc Af Amer: 26 mL/min/{1.73_m2} — ABNORMAL LOW (ref >59)
GFR calc non Af Amer: 22 mL/min/{1.73_m2} — ABNORMAL LOW (ref >59)
Glucose: 168 mg/dL — ABNORMAL HIGH (ref 65–99)
Potassium: 4.9 mmol/L (ref 3.5–5.2)
Sodium: 139 mmol/L (ref 134–144)

## 2016-08-11 DIAGNOSIS — H1045 Other chronic allergic conjunctivitis: Secondary | ICD-10-CM | POA: Diagnosis not present

## 2016-08-11 DIAGNOSIS — E113512 Type 2 diabetes mellitus with proliferative diabetic retinopathy with macular edema, left eye: Secondary | ICD-10-CM | POA: Diagnosis not present

## 2016-08-11 DIAGNOSIS — E113511 Type 2 diabetes mellitus with proliferative diabetic retinopathy with macular edema, right eye: Secondary | ICD-10-CM | POA: Diagnosis not present

## 2016-08-26 DIAGNOSIS — E113511 Type 2 diabetes mellitus with proliferative diabetic retinopathy with macular edema, right eye: Secondary | ICD-10-CM | POA: Diagnosis not present

## 2016-09-01 ENCOUNTER — Other Ambulatory Visit: Payer: Self-pay | Admitting: Internal Medicine

## 2016-09-24 ENCOUNTER — Encounter: Payer: Self-pay | Admitting: Internal Medicine

## 2016-09-24 ENCOUNTER — Ambulatory Visit (INDEPENDENT_AMBULATORY_CARE_PROVIDER_SITE_OTHER): Payer: Medicare Other | Admitting: Internal Medicine

## 2016-09-24 VITALS — BP 140/74 | HR 80 | Ht 62.0 in | Wt 202.0 lb

## 2016-09-24 DIAGNOSIS — E1122 Type 2 diabetes mellitus with diabetic chronic kidney disease: Secondary | ICD-10-CM | POA: Diagnosis not present

## 2016-09-24 DIAGNOSIS — N184 Chronic kidney disease, stage 4 (severe): Secondary | ICD-10-CM | POA: Diagnosis not present

## 2016-09-24 DIAGNOSIS — E1159 Type 2 diabetes mellitus with other circulatory complications: Secondary | ICD-10-CM | POA: Diagnosis not present

## 2016-09-24 MED ORDER — METFORMIN HCL ER 500 MG PO TB24: 500.0000 mg | ORAL_TABLET | Freq: Every day | ORAL | 1 refills | Status: DC

## 2016-09-24 NOTE — Progress Notes (Signed)
Patient ID: [name], female   DOB: 03/25/1951, 66 y.o.   MRN: [id number]  HPI: [name] is a 66 y.o.-year-old female, returning for f/u DM2, dx in 2006, insulin-independent, controlled, with complications (CAD - s/p AMI 06/2013, CHF; CKD; PN; DR). Last visit 4 mo ago.  Last hemoglobin A1c was: 08/21/2016: HbA1c 6.0% Lab Results  Component Value Date   HGBA1C 6.7 05/27/2016   HGBA1C 7.1 02/25/2016   HGBA1C 7.2 09/20/2015  Pt has refused insulin repeatedly in the past. She started to change her diet after her hemoglobin A1c returned high, at 16.2% in 09/2014. She cut down portions, changed the meal content to include more fiber and less carbs. HbA1c decreased dramatically.  Pt is on: - Metformin ER 500 mg 2x a day - started 10/2014 - Glipizide ER 5 mg in am - started 03/2015 She tried Tradjenta 5 mg daily in am >> CP with it (started 10/2014) >> had to stop - she did see her cardiologist since then She tried [place] >> decreased [name] and yeast inf. She tried regular metformin >> diarrhea  Pt checks her sugars 2x a day and they are still wonderful: - am: 340-400s >> 76-125 >> 98-140, 160, 201 >> 79, 80-120, 132 >> 66, 69-109 >> 94-119 >> 94-113 >> 84-118 - 2h after b'fast: n/c  >> 95-136 >> 91-118, 122 >> 94-122 - before lunch: n/c >> 79-130, 145 >> n/c >> 94-131 >> 114 >> 95, 104 - 2h after lunch: n/c >> 122, 127 >> 117, 130 - before dinner: n/c >> 86-122 >> n/c >> 85-116, 123 >> 88-120 >> 78, 90-130 >> 90-130 >> 78-128, 140 - 2h after dinner: n/c >> 102-140 >> 110-147 >> 98-128 - bedtime: n/c >> 76-142 >> 90, 122-180, 199 >> n/c >> 95-143, 166 (fam reunion) >> 99, 114 >> 109-142 - nighttime: n/c No lows. Lowest sugar was 78 >> 90 >> 78; ? hypoglycemia awareness (does not feel CBGs in the 60s) Highest sugar was 166 >> 147 >> 142.  Glucometer: One Touch Ultra  Pt's meals are: - Breakfast: oatmeal, cereals, [place] bacon, egg toast - Lunch: sandwich or fruit salad -  Dinner: salmon/chicken, Brussel sprouts, other veggies, sweet potatoes or brown rice, spaghetti - Snacks: 2: carrots with dip; yoghurt with fruit, salad at 10 pm, cereals after dinner She still walks for exercise.  - + CKD, last BUN/creatinine >> [name] 26 - not seeing a nephrologist:  Lab Results  Component Value Date   BUN 50 (H) 07/29/2016   CREATININE 2.25 (H) 07/29/2016  On Irbesartan. - last set of lipids: Lab Results  Component Value Date   CHOL 121 05/18/2016   HDL 60 05/18/2016   LDLCALC 51 05/18/2016   TRIG 48 05/18/2016   CHOLHDL 2.0 05/18/2016  On Crestor 5 mg daily. - last eye exam was in 02/08/2016. + DR. + cataracts.  - + numbness and tingling in her feet. Has PN.   She also has a history of hypertension, GERD, and anemia.  She also has a goiter containing small cysts. No neck compression sxs.  Her cough is better after changing from Coreg to Bystolic.  ROS: Constitutional: no Weight gain, + cold intolerance  Eyes: no blurry vision, no xerophthalmia ENT: no sore throat, no nodules palpated in throat, no dysphagia/odynophagia, no hoarseness Cardiovascular: no CP/SOB/palpitations/leg swelling Respiratory: + cough/no SOB Gastrointestinal: no N/V/D/C/+ acid reflux Musculoskeletal: no muscle aches /no joint aches Skin: no rash, + hair loss Neurological: no tremors/numbness/tingling/dizziness,  no headaches  I reviewed pt's medications, allergies, PMH, social hx, family hx, and changes were documented in the history of present illness. Otherwise, unchanged from my initial visit note.  Past Medical History:  Diagnosis Date  . Anemia   . COPD (chronic obstructive pulmonary disease) ([place])   . Diabetes mellitus   . Hyperlipidemia   . Hypertension   . Ischemic cardiomyopathy 06/11/2013  . Low back pain   . NSTEMI (non-ST elevated myocardial infarction) ([place]) 06/11/2013  . Obesity   . Personal history of colon cancer   . Renal insufficiency    Past Surgical  History:  Procedure Laterality Date  . COLECTOMY    . LEFT HEART CATHETERIZATION WITH CORONARY ANGIOGRAM N/A 06/09/2013   Procedure: LEFT HEART CATHETERIZATION WITH CORONARY ANGIOGRAM;  Surgeon: Ramond Dial, MD;  Location: [facility] CATH LAB;  Service: Cardiovascular;  Laterality: N/A;   History   Social History  . Marital Status: Single    Spouse Name: N/A  . Number of Children: 0   Occupational History  . retired   Social History Main Topics  . Smoking status:  former smoker     Types: Cigarettes  . Smokeless tobacco: Never Used  . Alcohol Use: No  . Drug Use: No   Current Outpatient Prescriptions on File Prior to Visit  Medication Sig Dispense Refill  . amLODipine (NORVASC) 10 MG tablet Take one-half tablet by  mouth daily 45 tablet 3  . aspirin 81 MG chewable tablet Chew 1 tablet (81 mg total) by mouth daily.    . calcium carbonate (OS-CAL) 600 MG TABS tablet Take 600 mg by mouth 2 (two) times daily with a meal.    . fluticasone (FLONASE) 50 MCG/ACT nasal spray Place 2 sprays into both nostrils daily. 48 g 3  . furosemide (LASIX) 80 MG tablet Take one-half tablet by  mouth daily 45 tablet 3  . glipiZIDE (GLUCOTROL XL) 5 MG 24 hr tablet TAKE 1 TABLET BY MOUTH  DAILY WITH BREAKFAST 90 tablet 1  . loratadine (CLARITIN) 10 MG tablet Take 1 tablet (10 mg total) by mouth daily. 100 tablet 3  . metFORMIN (GLUCOPHAGE-XR) 500 MG 24 hr tablet TAKE 1 TABLET BY MOUTH TWO  TIMES DAILY 180 tablet 1  . nebivolol (BYSTOLIC) 10 MG tablet Take 1 tablet (10 mg total) by mouth daily. 30 tablet 11  . nitroGLYCERIN (NITROSTAT) 0.4 MG SL tablet Dissolve 1 tablet under the tongue every 5 minutes as  needed for chest pain 50 tablet 1  . ONE TOUCH ULTRA TEST test strip USE TO TEST BLOOD SUGAR TWO TIMES A DAY AS DIRECTED 200 each 3  . potassium chloride SA (K-DUR,KLOR-CON) 20 MEQ tablet TAKE 1 TABLET BY MOUTH  DAILY 90 tablet 1  . ranitidine (ZANTAC) 150 MG tablet Take 1 tablet (150 mg total) by mouth 2  (two) times daily. 60 tablet 11  . rosuvastatin (CRESTOR) 5 MG tablet TAKE 1 TABLET BY MOUTH  DAILY AT 6PM 90 tablet 1  . traMADol-acetaminophen (ULTRACET) 37.5-325 MG tablet Take 0.5-1 tablets by mouth every 8 (eight) hours as needed for severe pain. 100 tablet 2  . valsartan (DIOVAN) 160 MG tablet Take 1 tablet (160 mg total) by mouth daily. 30 tablet 11  . vitamin B-12 (CYANOCOBALAMIN) 1000 MCG tablet Take 1,000 mcg by mouth daily.     No current facility-administered medications on file prior to visit.    Allergies  Allergen Reactions  . Tradjenta [Linagliptin] Anaphylaxis  CP  . Atorvastatin     REACTION: aches and pains  . Enalapril Maleate     REACTION: cough  . Farxiga [Dapagliflozin] Itching  . Hydrochlorothiazide     REACTION: hair loss  . Kenalog [Triamcinolone Acetonide]     HANDS NUMB   . Metformin And Related     Diarrhea, dizziness  . Propoxyphene N-Acetaminophen Hives  . Simvastatin     REACTION: cramps  . Spironolactone     REACTION: cramps   Family History  Problem Relation Age of Onset  . Hypertension     PE: BP 140/74 (BP Location: Left Arm, Patient Position: Sitting)   Pulse 80   Ht 5\' 2"  (1.575 m)   Wt 202 lb (91.6 kg)   SpO2 98%   BMI 36.95 kg/m  Body mass index is 36.95 kg/m. Wt Readings from Last 3 Encounters:  09/24/16 202 lb (91.6 kg)  07/29/16 196 lb 1.9 oz (89 kg)  07/16/16 197 lb (89.4 kg)   Constitutional: overweight, in NAD Eyes: PERRLA, EOMI, no exophthalmos ENT: moist mucous membranes, + symmetric thyromegaly, no cervical lymphadenopathy Cardiovascular: RRR, + 1/6 SEM, no RG Respiratory: CTA B Gastrointestinal: abdomen soft, NT, ND, BS+ Musculoskeletal: no deformities, strength intact in all 4 Skin: moist, warm, no rashes Neurological: no tremor with outstretched hands, DTR normal in all 4  ASSESSMENT: 1. DM2, insulin-independent, controlled, with complications - CAD, s/p AMI 06/2013 - Dr Acie Fredrickson - CHF - CKD - PN - DR  - Dr. Jalene Mullet ([facility]) - on IO injections >> improving  2. Goiter - No neck compression symptoms  - 10/19/2014: thyroid ultrasound: Right thyroid lobe: 4.0 x 1.5 x 1.6 cm. Heterogeneous parenchyma with multiple small cysts identified. The largest measures approximately 0.5 x 0.3 x 0.5 cm. All of the small cysts in the right lobe appears simple and benign.  Left thyroid lobe: 4.5 x 1.4 x 2.0 cm. Heterogeneous parenchyma with multiple cysts. The largest measures approximately 1.0 x 0.4 x 0.5 cm. 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 Similar smaller cyst measures 0.8 cm in greatest diameter. There is a small solid nodule measuring 0.7 x 0.4 x 0.6 cm.  Isthmus Thickness: 0.4 cm. No nodules visualized.  Lymphadenopathy: None visualized.          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 No intervention needed.  3. CKD  PLAN:  1. Patient with long-standing, prev. uncontrolled diabetes, with initially extremely high sugars, now great CBGs after changing to Metformin ER, addition of Glipizide ER, and changing her diet. She refused insulin in the past but this is not needed now. However, as her [name] declined >> we need to reduce Metformin and may even need to stop at next visit. - We checked hemoglobin A1c at last visit: 6.7% >> she had a repeat HbA1c in 08/2016: 6.0% (great!!!) - I suggested to:  Patient Instructions  Please discuss with Dr. Alain Marion about a possible referral to nephrology.  Please decrease: - Metformin to 500 mg daily (with dinner)  Continue: - Glipizide ER 5 mg in am  Please return in 3-4 months with your sugar log.   - continue checking sugars at different times of the day - check 1-2 times a day, rotating checks - advised for yearly eye exams - UTD - refuses flu shot  - Return to clinic in 4 mo with sugar log   2. Goiter - No neck compression symptoms - small thyroid cysts per last U/S in 10/2014 -  no further f/u is  necessary unless she develops neck compression sxs  3. 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 Endocrinology

## 2016-09-24 NOTE — Patient Instructions (Signed)
Please discuss with Dr. Alain Marion about a possible referral to nephrology.  Please decrease: - Metformin to 500 mg daily (with dinner)  Continue: - Glipizide ER 5 mg in am  Please return in 3-4 months with your sugar log.

## 2016-10-28 DIAGNOSIS — E113592 Type 2 diabetes mellitus with proliferative diabetic retinopathy without macular edema, left eye: Secondary | ICD-10-CM | POA: Diagnosis not present

## 2016-10-28 DIAGNOSIS — H2513 Age-related nuclear cataract, bilateral: Secondary | ICD-10-CM | POA: Diagnosis not present

## 2016-10-28 DIAGNOSIS — E113511 Type 2 diabetes mellitus with proliferative diabetic retinopathy with macular edema, right eye: Secondary | ICD-10-CM | POA: Diagnosis not present

## 2016-11-13 ENCOUNTER — Other Ambulatory Visit (INDEPENDENT_AMBULATORY_CARE_PROVIDER_SITE_OTHER): Payer: Medicare Other

## 2016-11-13 ENCOUNTER — Encounter: Payer: Self-pay | Admitting: Internal Medicine

## 2016-11-13 ENCOUNTER — Ambulatory Visit (INDEPENDENT_AMBULATORY_CARE_PROVIDER_SITE_OTHER): Payer: Medicare Other | Admitting: Internal Medicine

## 2016-11-13 DIAGNOSIS — M5440 Lumbago with sciatica, unspecified side: Secondary | ICD-10-CM

## 2016-11-13 DIAGNOSIS — Z85038 Personal history of other malignant neoplasm of large intestine: Secondary | ICD-10-CM

## 2016-11-13 DIAGNOSIS — G8929 Other chronic pain: Secondary | ICD-10-CM | POA: Diagnosis not present

## 2016-11-13 DIAGNOSIS — I5022 Chronic systolic (congestive) heart failure: Secondary | ICD-10-CM | POA: Diagnosis not present

## 2016-11-13 DIAGNOSIS — N184 Chronic kidney disease, stage 4 (severe): Secondary | ICD-10-CM

## 2016-11-13 DIAGNOSIS — I2510 Atherosclerotic heart disease of native coronary artery without angina pectoris: Secondary | ICD-10-CM | POA: Diagnosis not present

## 2016-11-13 DIAGNOSIS — I2583 Coronary atherosclerosis due to lipid rich plaque: Secondary | ICD-10-CM | POA: Diagnosis not present

## 2016-11-13 DIAGNOSIS — I10 Essential (primary) hypertension: Secondary | ICD-10-CM | POA: Diagnosis not present

## 2016-11-13 DIAGNOSIS — D509 Iron deficiency anemia, unspecified: Secondary | ICD-10-CM

## 2016-11-13 DIAGNOSIS — E1159 Type 2 diabetes mellitus with other circulatory complications: Secondary | ICD-10-CM | POA: Diagnosis not present

## 2016-11-13 DIAGNOSIS — E1165 Type 2 diabetes mellitus with hyperglycemia: Secondary | ICD-10-CM

## 2016-11-13 DIAGNOSIS — E1122 Type 2 diabetes mellitus with diabetic chronic kidney disease: Secondary | ICD-10-CM

## 2016-11-13 DIAGNOSIS — IMO0002 Reserved for concepts with insufficient information to code with codable children: Secondary | ICD-10-CM

## 2016-11-13 DIAGNOSIS — E1140 Type 2 diabetes mellitus with diabetic neuropathy, unspecified: Secondary | ICD-10-CM

## 2016-11-13 LAB — HEPATIC FUNCTION PANEL
ALT: 12 U/L (ref 0–35)
AST: 11 U/L (ref 0–37)
Albumin: 4.3 g/dL (ref 3.5–5.2)
Alkaline Phosphatase: 95 U/L (ref 39–117)
Bilirubin, Direct: 0.1 mg/dL (ref 0.0–0.3)
Total Bilirubin: 0.3 mg/dL (ref 0.2–1.2)
Total Protein: 7.6 g/dL (ref 6.0–8.3)

## 2016-11-13 LAB — CBC WITH DIFFERENTIAL/PLATELET
Basophils Absolute: 0 10*3/uL (ref 0.0–0.1)
Basophils Relative: 0.5 % (ref 0.0–3.0)
Eosinophils Absolute: 0.1 10*3/uL (ref 0.0–0.7)
Eosinophils Relative: 1.1 % (ref 0.0–5.0)
HCT: 30.3 % — ABNORMAL LOW (ref 36.0–46.0)
Hemoglobin: 10 g/dL — ABNORMAL LOW (ref 12.0–15.0)
Lymphocytes Relative: 19.6 % (ref 12.0–46.0)
Lymphs Abs: 1.2 10*3/uL (ref 0.7–4.0)
MCHC: 32.9 g/dL (ref 30.0–36.0)
MCV: 86.4 fl (ref 78.0–100.0)
Monocytes Absolute: 0.5 10*3/uL (ref 0.1–1.0)
Monocytes Relative: 8.4 % (ref 3.0–12.0)
Neutro Abs: 4.3 10*3/uL (ref 1.4–7.7)
Neutrophils Relative %: 70.4 % (ref 43.0–77.0)
Platelets: 144 10*3/uL — ABNORMAL LOW (ref 150.0–400.0)
RBC: 3.5 Mil/uL — ABNORMAL LOW (ref 3.87–5.11)
RDW: 15.5 % (ref 11.5–15.5)
WBC: 6.1 10*3/uL (ref 4.0–10.5)

## 2016-11-13 LAB — HEMOGLOBIN A1C: Hgb A1c MFr Bld: 8.5 % — ABNORMAL HIGH (ref 4.6–6.5)

## 2016-11-13 NOTE — Progress Notes (Signed)
Subjective:  Patient ID: [name], female    DOB: 1951-06-30  Age: 66 y.o. MRN: [id number]  CC: No chief complaint on file.   HPI [name] presents for HTN, CAD, dyslipidemia f/u C/o leg cramps Pt had a steroid shot in the eye Pt refused all vaccines  Outpatient Medications Prior to Visit  Medication Sig Dispense Refill  . amLODipine (NORVASC) 10 MG tablet Take one-half tablet by  mouth daily 45 tablet 3  . aspirin 81 MG chewable tablet Chew 1 tablet (81 mg total) by mouth daily.    . calcium carbonate (OS-CAL) 600 MG TABS tablet Take 600 mg by mouth 2 (two) times daily with a meal.    . fluticasone (FLONASE) 50 MCG/ACT nasal spray Place 2 sprays into both nostrils daily. 48 g 3  . furosemide (LASIX) 80 MG tablet Take one-half tablet by  mouth daily 45 tablet 3  . glipiZIDE (GLUCOTROL XL) 5 MG 24 hr tablet TAKE 1 TABLET BY MOUTH  DAILY WITH BREAKFAST 90 tablet 1  . loratadine (CLARITIN) 10 MG tablet Take 1 tablet (10 mg total) by mouth daily. 100 tablet 3  . metFORMIN (GLUCOPHAGE-XR) 500 MG 24 hr tablet Take 1 tablet (500 mg total) by mouth daily with breakfast. 90 tablet 1  . nebivolol (BYSTOLIC) 10 MG tablet Take 1 tablet (10 mg total) by mouth daily. 30 tablet 11  . nitroGLYCERIN (NITROSTAT) 0.4 MG SL tablet Dissolve 1 tablet under the tongue every 5 minutes as  needed for chest pain 50 tablet 1  . ONE TOUCH ULTRA TEST test strip USE TO TEST BLOOD SUGAR TWO TIMES A DAY AS DIRECTED 200 each 3  . potassium chloride SA (K-DUR,KLOR-CON) 20 MEQ tablet TAKE 1 TABLET BY MOUTH  DAILY 90 tablet 1  . ranitidine (ZANTAC) 150 MG tablet Take 1 tablet (150 mg total) by mouth 2 (two) times daily. 60 tablet 11  . rosuvastatin (CRESTOR) 5 MG tablet TAKE 1 TABLET BY MOUTH  DAILY AT 6PM 90 tablet 1  . traMADol-acetaminophen (ULTRACET) 37.5-325 MG tablet Take 0.5-1 tablets by mouth every 8 (eight) hours as needed for severe pain. 100 tablet 2  . valsartan (DIOVAN) 160 MG tablet Take  1 tablet (160 mg total) by mouth daily. 30 tablet 11  . vitamin B-12 (CYANOCOBALAMIN) 1000 MCG tablet Take 1,000 mcg by mouth daily.     No facility-administered medications prior to visit.     ROS Review of Systems  Constitutional: Negative for activity change, appetite change, chills, fatigue and unexpected weight change.  HENT: Negative for congestion, mouth sores and sinus pressure.   Eyes: Negative for visual disturbance.  Respiratory: Negative for cough and chest tightness.   Gastrointestinal: Negative for abdominal pain and nausea.  Genitourinary: Negative for difficulty urinating, frequency and vaginal pain.  Musculoskeletal: Positive for back pain. Negative for gait problem.  Skin: Negative for pallor and rash.  Neurological: Negative for dizziness, tremors, weakness, numbness and headaches.  Psychiatric/Behavioral: Negative for confusion and sleep disturbance.    Objective:  BP 118/76 (BP Location: Left Arm, Patient Position: Sitting, Cuff Size: Large)   Pulse 68   Temp 98.5 F (36.9 C) (Oral)   Ht 5\' 2"  (1.575 m)   Wt 198 lb (89.8 kg)   SpO2 99%   BMI 36.21 kg/m   BP Readings from Last 3 Encounters:  11/13/16 118/76  09/24/16 140/74  07/29/16 140/76    Wt Readings from Last 3 Encounters:  11/13/16 198 lb (  89.8 kg)  09/24/16 202 lb (91.6 kg)  07/29/16 196 lb 1.9 oz (89 kg)    Physical Exam  Constitutional: She appears well-developed. No distress.  HENT:  Head: Normocephalic.  Right Ear: External ear normal.  Left Ear: External ear normal.  Nose: Nose normal.  Mouth/Throat: Oropharynx is clear and moist.  Eyes: Conjunctivae are normal. Pupils are equal, round, and reactive to light. Right eye exhibits no discharge. Left eye exhibits no discharge.  Neck: Normal range of motion. Neck supple. No JVD present. No tracheal deviation present. No thyromegaly present.  Cardiovascular: Normal rate, regular rhythm and normal heart sounds.   Pulmonary/Chest: No  stridor. No respiratory distress. She has no wheezes.  Abdominal: Soft. Bowel sounds are normal. She exhibits no distension and no mass. There is no tenderness. There is no rebound and no guarding.  Musculoskeletal: She exhibits tenderness. She exhibits no edema.  Lymphadenopathy:    She has no cervical adenopathy.  Neurological: She displays normal reflexes. No cranial nerve deficit. She exhibits normal muscle tone. Coordination normal.  Skin: No rash noted. No erythema.  Psychiatric: She has a normal mood and affect. Her behavior is normal. Judgment and thought content normal.  obese LS tender  Lab Results  Component Value Date   WBC 4.3 09/21/2014   HGB 11.9 (L) 09/21/2014   HCT 34.3 (L) 09/21/2014   PLT 131.0 (L) 09/21/2014   GLUCOSE 168 (H) 07/29/2016   CHOL 121 05/18/2016   TRIG 48 05/18/2016   HDL 60 05/18/2016   LDLCALC 51 05/18/2016   ALT 11 05/18/2016   AST 13 05/18/2016   NA 139 07/29/2016   K 4.9 07/29/2016   CL 105 07/29/2016   CREATININE 2.25 (H) 07/29/2016   BUN 50 (H) 07/29/2016   CO2 20 07/29/2016   TSH 2.19 10/03/2013   INR 1.16 06/07/2013   HGBA1C 6.7 05/27/2016   MICROALBUR 13.9 (H) 10/13/2007    Dg Chest 2 View  Result Date: 04/13/2016 CLINICAL DATA:  Productive cough for several months, initial encounter EXAM: CHEST  2 VIEW COMPARISON:  06/11/2013 FINDINGS: Cardiac shadow is within normal limits. The lungs are well aerated bilaterally. No focal infiltrate or sizable effusion is noted. No bony abnormality is seen. IMPRESSION: No acute abnormality noted. Electronically Signed   By: Inez Catalina M.D.   On: 04/13/2016 11:03    Assessment & Plan:   There are no diagnoses linked to this encounter. I am having Ms. Smithers maintain her vitamin B-12, aspirin, calcium carbonate, loratadine, traMADol-acetaminophen, fluticasone, nitroGLYCERIN, furosemide, amLODipine, nebivolol, ONE TOUCH ULTRA TEST, valsartan, ranitidine, rosuvastatin, glipiZIDE, potassium  chloride SA, and metFORMIN.  No orders of the defined types were placed in this encounter.    Follow-up: No Follow-up on file.  Walker Kehr, MD

## 2016-11-13 NOTE — Assessment & Plan Note (Signed)
Cont w/Norvasc, Avapro, Coreg

## 2016-11-13 NOTE — Assessment & Plan Note (Signed)
f/u w/Dr Nahser Cont w/Aspirin, Crestor

## 2016-11-13 NOTE — Assessment & Plan Note (Signed)
Try Turmeric 

## 2016-11-13 NOTE — Assessment & Plan Note (Signed)
Monitor BMET. 

## 2016-11-13 NOTE — Patient Instructions (Addendum)
[facility] w/Jill  Shingrix vaccine   Furosemide, Rosuvastatin can cause cramps Try Tylenol PM, Benadryl, CoQ10 for cramps  Try Turmeric for pain

## 2016-11-13 NOTE — Assessment & Plan Note (Signed)
f/u w/GI No sx's

## 2016-11-17 ENCOUNTER — Telehealth: Payer: Self-pay | Admitting: Internal Medicine

## 2016-11-17 NOTE — Telephone Encounter (Signed)
Patient called back in. Gave MD response on labs.  Patient has follow up apt with endo in two months.  I advised patient to call endo to have them look at recent labs to see if she needs to go in sooner.  Patient also would like [facility] to enter referral for GI

## 2016-11-17 NOTE — Telephone Encounter (Signed)
Referral entered  

## 2016-11-30 ENCOUNTER — Other Ambulatory Visit: Payer: Self-pay | Admitting: Internal Medicine

## 2016-12-01 ENCOUNTER — Telehealth: Payer: Self-pay | Admitting: Internal Medicine

## 2016-12-01 NOTE — Telephone Encounter (Signed)
Called patient regarding awv. Pt stated that she has already completed her awv for in Feb of this year (2018) with nurse from [facility]. Informed patient that she can bring copy of paperwork to scanned into her chart.

## 2016-12-07 DIAGNOSIS — E113511 Type 2 diabetes mellitus with proliferative diabetic retinopathy with macular edema, right eye: Secondary | ICD-10-CM | POA: Diagnosis not present

## 2016-12-08 ENCOUNTER — Encounter: Payer: Self-pay | Admitting: Internal Medicine

## 2016-12-08 ENCOUNTER — Ambulatory Visit (INDEPENDENT_AMBULATORY_CARE_PROVIDER_SITE_OTHER): Payer: Medicare Other | Admitting: Internal Medicine

## 2016-12-08 DIAGNOSIS — N184 Chronic kidney disease, stage 4 (severe): Secondary | ICD-10-CM | POA: Diagnosis not present

## 2016-12-08 DIAGNOSIS — R21 Rash and other nonspecific skin eruption: Secondary | ICD-10-CM | POA: Insufficient documentation

## 2016-12-08 DIAGNOSIS — L299 Pruritus, unspecified: Secondary | ICD-10-CM

## 2016-12-08 DIAGNOSIS — E1122 Type 2 diabetes mellitus with diabetic chronic kidney disease: Secondary | ICD-10-CM | POA: Diagnosis not present

## 2016-12-08 MED ORDER — TRIAMCINOLONE ACETONIDE 0.1 % EX CREA: 1.0000 "application " | TOPICAL_CREAM | Freq: Four times a day (QID) | CUTANEOUS | 3 refills | Status: DC

## 2016-12-08 MED ORDER — LORATADINE 10 MG PO TABS: 10.0000 mg | ORAL_TABLET | Freq: Every day | ORAL | 3 refills | Status: DC

## 2016-12-08 MED ORDER — HYDROXYZINE HCL 25 MG PO TABS: 25.0000 mg | ORAL_TABLET | Freq: Three times a day (TID) | ORAL | 0 refills | Status: DC | PRN

## 2016-12-08 NOTE — Assessment & Plan Note (Signed)
Hydroxyzine prn 

## 2016-12-08 NOTE — Progress Notes (Signed)
Subjective:  Patient ID: [name], female    DOB: Oct 04, 1950  Age: 66 y.o. MRN: [id number]  CC: No chief complaint on file.   HPI [name] presents for severe rash all over on 3 weeks. No new meds. New softener for laundry. C/o itching. No sun exposure  Outpatient Medications Prior to Visit  Medication Sig Dispense Refill  . amLODipine (NORVASC) 10 MG tablet Take one-half tablet by  mouth daily 45 tablet 3  . aspirin 81 MG chewable tablet Chew 1 tablet (81 mg total) by mouth daily.    . calcium carbonate (OS-CAL) 600 MG TABS tablet Take 600 mg by mouth 2 (two) times daily with a meal.    . fluticasone (FLONASE) 50 MCG/ACT nasal spray Place 2 sprays into both nostrils daily. 48 g 3  . furosemide (LASIX) 80 MG tablet TAKE ONE-HALF TABLET BY  MOUTH DAILY 45 tablet 1  . glipiZIDE (GLUCOTROL XL) 5 MG 24 hr tablet TAKE 1 TABLET BY MOUTH  DAILY WITH BREAKFAST 90 tablet 1  . loratadine (CLARITIN) 10 MG tablet Take 1 tablet (10 mg total) by mouth daily. 100 tablet 3  . metFORMIN (GLUCOPHAGE-XR) 500 MG 24 hr tablet Take 1 tablet (500 mg total) by mouth daily with breakfast. 90 tablet 1  . nebivolol (BYSTOLIC) 10 MG tablet Take 1 tablet (10 mg total) by mouth daily. 30 tablet 11  . nitroGLYCERIN (NITROSTAT) 0.4 MG SL tablet Dissolve 1 tablet under the tongue every 5 minutes as  needed for chest pain 50 tablet 1  . ONE TOUCH ULTRA TEST test strip USE TO TEST BLOOD SUGAR TWO TIMES A DAY AS DIRECTED 200 each 3  . potassium chloride SA (K-DUR,KLOR-CON) 20 MEQ tablet TAKE 1 TABLET BY MOUTH  DAILY 90 tablet 1  . ranitidine (ZANTAC) 150 MG tablet Take 1 tablet (150 mg total) by mouth 2 (two) times daily. 60 tablet 11  . rosuvastatin (CRESTOR) 5 MG tablet TAKE 1 TABLET BY MOUTH  DAILY AT 6PM 90 tablet 1  . traMADol-acetaminophen (ULTRACET) 37.5-325 MG tablet Take 0.5-1 tablets by mouth every 8 (eight) hours as needed for severe pain. 100 tablet 2  . valsartan (DIOVAN) 160 MG tablet Take 1  tablet (160 mg total) by mouth daily. 30 tablet 11  . vitamin B-12 (CYANOCOBALAMIN) 1000 MCG tablet Take 1,000 mcg by mouth daily.     No facility-administered medications prior to visit.     ROS Review of Systems  Constitutional: Negative for activity change, appetite change, chills, fatigue and unexpected weight change.  HENT: Negative for congestion, mouth sores and sinus pressure.   Eyes: Negative for visual disturbance.  Respiratory: Negative for cough and chest tightness.   Gastrointestinal: Negative for abdominal pain and nausea.  Genitourinary: Negative for difficulty urinating, frequency and vaginal pain.  Musculoskeletal: Positive for back pain. Negative for gait problem.  Skin: Positive for rash. Negative for pallor.  Neurological: Negative for dizziness, tremors, weakness, numbness and headaches.  Psychiatric/Behavioral: Negative for confusion and sleep disturbance.    Objective:  BP 138/76 (BP Location: Left Arm, Patient Position: Sitting, Cuff Size: Large)   Pulse 75   Temp 97.9 F (36.6 C) (Oral)   Ht 5\' 2"  (1.575 m)   Wt 200 lb (90.7 kg)   SpO2 100%   BMI 36.58 kg/m   BP Readings from Last 3 Encounters:  12/08/16 138/76  11/13/16 118/76  09/24/16 140/74    Wt Readings from Last 3 Encounters:  12/08/16 200  lb (90.7 kg)  11/13/16 198 lb (89.8 kg)  09/24/16 202 lb (91.6 kg)    Physical Exam  Constitutional: She appears well-developed. No distress.  HENT:  Head: Normocephalic.  Right Ear: External ear normal.  Left Ear: External ear normal.  Nose: Nose normal.  Mouth/Throat: Oropharynx is clear and moist.  Eyes: Conjunctivae are normal. Pupils are equal, round, and reactive to light. Right eye exhibits no discharge. Left eye exhibits no discharge.  Neck: Normal range of motion. Neck supple. No JVD present. No tracheal deviation present. No thyromegaly present.  Cardiovascular: Normal rate, regular rhythm and normal heart sounds.   Pulmonary/Chest: No  stridor. No respiratory distress. She has no wheezes.  Abdominal: Soft. Bowel sounds are normal. She exhibits no distension and no mass. There is no tenderness. There is no rebound and no guarding.  Musculoskeletal: She exhibits no edema or tenderness.  Lymphadenopathy:    She has no cervical adenopathy.  Neurological: She displays normal reflexes. No cranial nerve deficit. She exhibits normal muscle tone. Coordination normal.  Skin: Rash noted. There is erythema.  Psychiatric: She has a normal mood and affect. Her behavior is normal. Judgment and thought content normal.   Extensive eryth  rash on arms, abd, legs, back   Lab Results  Component Value Date   WBC 6.1 11/13/2016   HGB 10.0 (L) 11/13/2016   HCT 30.3 (L) 11/13/2016   PLT 144.0 (L) 11/13/2016   GLUCOSE 168 (H) 07/29/2016   CHOL 121 05/18/2016   TRIG 48 05/18/2016   HDL 60 05/18/2016   LDLCALC 51 05/18/2016   ALT 12 11/13/2016   AST 11 11/13/2016   NA 139 07/29/2016   K 4.9 07/29/2016   CL 105 07/29/2016   CREATININE 2.25 (H) 07/29/2016   BUN 50 (H) 07/29/2016   CO2 20 07/29/2016   TSH 2.19 10/03/2013   INR 1.16 06/07/2013   HGBA1C 8.5 (H) 11/13/2016   MICROALBUR 13.9 (H) 10/13/2007    Dg Chest 2 View  Result Date: 04/13/2016 CLINICAL DATA:  Productive cough for several months, initial encounter EXAM: CHEST  2 VIEW COMPARISON:  06/11/2013 FINDINGS: Cardiac shadow is within normal limits. The lungs are well aerated bilaterally. No focal infiltrate or sizable effusion is noted. No bony abnormality is seen. IMPRESSION: No acute abnormality noted. Electronically Signed   By: Inez Catalina M.D.   On: 04/13/2016 11:03    Assessment & Plan:   There are no diagnoses linked to this encounter. I am having Ms. Bodin maintain her vitamin B-12, aspirin, calcium carbonate, loratadine, traMADol-acetaminophen, fluticasone, nitroGLYCERIN, amLODipine, nebivolol, ONE TOUCH ULTRA TEST, valsartan, ranitidine, rosuvastatin,  glipiZIDE, potassium chloride SA, metFORMIN, and furosemide.  No orders of the defined types were placed in this encounter.    Follow-up: No Follow-up on file.  Walker Kehr, MD

## 2016-12-08 NOTE — Assessment & Plan Note (Signed)
Monitor labs 

## 2016-12-08 NOTE — Assessment & Plan Note (Signed)
?  etiology Pt refused steroids po/im Triamc cream, Claritin May need to d/c BP meds one at the time to est the causation

## 2016-12-30 ENCOUNTER — Telehealth: Payer: Self-pay | Admitting: Internal Medicine

## 2016-12-30 ENCOUNTER — Ambulatory Visit (INDEPENDENT_AMBULATORY_CARE_PROVIDER_SITE_OTHER): Payer: Medicare Other | Admitting: Internal Medicine

## 2016-12-30 DIAGNOSIS — N184 Chronic kidney disease, stage 4 (severe): Secondary | ICD-10-CM

## 2016-12-30 DIAGNOSIS — L299 Pruritus, unspecified: Secondary | ICD-10-CM

## 2016-12-30 DIAGNOSIS — E1159 Type 2 diabetes mellitus with other circulatory complications: Secondary | ICD-10-CM

## 2016-12-30 DIAGNOSIS — E1122 Type 2 diabetes mellitus with diabetic chronic kidney disease: Secondary | ICD-10-CM | POA: Diagnosis not present

## 2016-12-30 DIAGNOSIS — I10 Essential (primary) hypertension: Secondary | ICD-10-CM

## 2016-12-30 MED ORDER — FUROSEMIDE 80 MG PO TABS: 40.0000 mg | ORAL_TABLET | Freq: Every day | ORAL | 1 refills | Status: DC

## 2016-12-30 NOTE — Progress Notes (Signed)
Subjective:  Patient ID: [name], female    DOB: [birth year]  Age: 66 y.o. MRN: [id number]  CC: No chief complaint on file.   HPI [name] presents for pruritis f/u - not better. She thinks it is Valsartan that is making her itch...  Outpatient Medications Prior to Visit  Medication Sig Dispense Refill  . amLODipine (NORVASC) 10 MG tablet Take one-half tablet by  mouth daily 45 tablet 3  . aspirin 81 MG chewable tablet Chew 1 tablet (81 mg total) by mouth daily.    . calcium carbonate (OS-CAL) 600 MG TABS tablet Take 600 mg by mouth 2 (two) times daily with a meal.    . fluticasone (FLONASE) 50 MCG/ACT nasal spray Place 2 sprays into both nostrils daily. 48 g 3  . furosemide (LASIX) 80 MG tablet TAKE ONE-HALF TABLET BY  MOUTH DAILY 45 tablet 1  . glipiZIDE (GLUCOTROL XL) 5 MG 24 hr tablet TAKE 1 TABLET BY MOUTH  DAILY WITH BREAKFAST 90 tablet 1  . hydrOXYzine (ATARAX/VISTARIL) 25 MG tablet Take 1-2 tablets (25-50 mg total) by mouth every 8 (eight) hours as needed for itching. 60 tablet 0  . loratadine (CLARITIN) 10 MG tablet Take 1 tablet (10 mg total) by mouth daily. 100 tablet 3  . metFORMIN (GLUCOPHAGE-XR) 500 MG 24 hr tablet Take 1 tablet (500 mg total) by mouth daily with breakfast. 90 tablet 1  . nebivolol (BYSTOLIC) 10 MG tablet Take 1 tablet (10 mg total) by mouth daily. 30 tablet 11  . nitroGLYCERIN (NITROSTAT) 0.4 MG SL tablet Dissolve 1 tablet under the tongue every 5 minutes as  needed for chest pain 50 tablet 1  . ONE TOUCH ULTRA TEST test strip USE TO TEST BLOOD SUGAR TWO TIMES A DAY AS DIRECTED 200 each 3  . potassium chloride SA (K-DUR,KLOR-CON) 20 MEQ tablet TAKE 1 TABLET BY MOUTH  DAILY 90 tablet 1  . ranitidine (ZANTAC) 150 MG tablet Take 1 tablet (150 mg total) by mouth 2 (two) times daily. 60 tablet 11  . rosuvastatin (CRESTOR) 5 MG tablet TAKE 1 TABLET BY MOUTH  DAILY AT 6PM 90 tablet 1  . traMADol-acetaminophen (ULTRACET) 37.5-325 MG tablet Take  0.5-1 tablets by mouth every 8 (eight) hours as needed for severe pain. 100 tablet 2  . triamcinolone cream (KENALOG) 0.1 % Apply 1 application topically 4 (four) times daily. 450 g 3  . valsartan (DIOVAN) 160 MG tablet Take 1 tablet (160 mg total) by mouth daily. 30 tablet 11  . vitamin B-12 (CYANOCOBALAMIN) 1000 MCG tablet Take 1,000 mcg by mouth daily.    [name] loratadine (CLARITIN) 10 MG tablet Take 1 tablet (10 mg total) by mouth daily. 100 tablet 3   No facility-administered medications prior to visit.     ROS Review of Systems  Constitutional: Negative for activity change, appetite change, chills, fatigue and unexpected weight change.  HENT: Negative for congestion, mouth sores and sinus pressure.   Eyes: Negative for visual disturbance.  Respiratory: Negative for cough and chest tightness.   Gastrointestinal: Negative for abdominal pain and nausea.  Genitourinary: Negative for difficulty urinating, frequency and vaginal pain.  Musculoskeletal: Negative for back pain and gait problem.  Skin: Negative for pallor and rash.  Neurological: Negative for dizziness, tremors, weakness, numbness and headaches.  Psychiatric/Behavioral: Negative for confusion, sleep disturbance and suicidal ideas.    Objective:  BP 138/72 (BP Location: Left Arm, Patient Position: Sitting, Cuff Size: Large)   Pulse 75  Temp 98.1 F (36.7 C) (Oral)   Ht 5\' 2"  (1.575 m)   Wt 199 lb (90.3 kg)   SpO2 100%   BMI 36.40 kg/m   BP Readings from Last 3 Encounters:  12/30/16 138/72  12/08/16 138/76  11/13/16 118/76    Wt Readings from Last 3 Encounters:  12/30/16 199 lb (90.3 kg)  12/08/16 200 lb (90.7 kg)  11/13/16 198 lb (89.8 kg)    Physical Exam  Constitutional: She appears well-developed. No distress.  HENT:  Head: Normocephalic.  Right Ear: External ear normal.  Left Ear: External ear normal.  Nose: Nose normal.  Mouth/Throat: Oropharynx is clear and moist.  Eyes: Conjunctivae are normal.  Pupils are equal, round, and reactive to light. Right eye exhibits no discharge. Left eye exhibits no discharge.  Neck: Normal range of motion. Neck supple. No JVD present. No tracheal deviation present. No thyromegaly present.  Cardiovascular: Normal rate, regular rhythm and normal heart sounds.   Pulmonary/Chest: No stridor. No respiratory distress. She has no wheezes.  Abdominal: Soft. Bowel sounds are normal. She exhibits no distension and no mass. There is no tenderness. There is no rebound and no guarding.  Musculoskeletal: She exhibits no edema or tenderness.  Lymphadenopathy:    She has no cervical adenopathy.  Neurological: She displays normal reflexes. No cranial nerve deficit. She exhibits normal muscle tone. Coordination normal.  Skin: No rash noted. No erythema.  Psychiatric: She has a normal mood and affect. Her behavior is normal. Judgment and thought content normal.  dry skin on back Scaly rash on arms  Lab Results  Component Value Date   WBC 6.1 11/13/2016   HGB 10.0 (L) 11/13/2016   HCT 30.3 (L) 11/13/2016   PLT 144.0 (L) 11/13/2016   GLUCOSE 168 (H) 07/29/2016   CHOL 121 05/18/2016   TRIG 48 05/18/2016   HDL 60 05/18/2016   LDLCALC 51 05/18/2016   ALT 12 11/13/2016   AST 11 11/13/2016   NA 139 07/29/2016   K 4.9 07/29/2016   CL 105 07/29/2016   CREATININE 2.25 (H) 07/29/2016   BUN 50 (H) 07/29/2016   CO2 20 07/29/2016   TSH 2.19 10/03/2013   INR 1.16 06/07/2013   HGBA1C 8.5 (H) 11/13/2016   MICROALBUR 13.9 (H) 10/13/2007    Dg Chest 2 View  Result Date: 04/13/2016 CLINICAL DATA:  Productive cough for several months, initial encounter EXAM: CHEST  2 VIEW COMPARISON:  06/11/2013 FINDINGS: Cardiac shadow is within normal limits. The lungs are well aerated bilaterally. No focal infiltrate or sizable effusion is noted. No bony abnormality is seen. IMPRESSION: No acute abnormality noted. Electronically Signed   By: Inez Catalina M.D.   On: 04/13/2016 11:03     Assessment & Plan:   There are no diagnoses linked to this encounter. I am having [name] maintain her vitamin B-12, aspirin, calcium carbonate, traMADol-acetaminophen, fluticasone, nitroGLYCERIN, amLODipine, nebivolol, ONE TOUCH ULTRA TEST, valsartan, ranitidine, rosuvastatin, glipiZIDE, potassium chloride SA, metFORMIN, furosemide, triamcinolone cream, hydrOXYzine, and loratadine.  No orders of the defined types were placed in this encounter.    Follow-up: No Follow-up on file.  Walker Kehr, MD

## 2016-12-30 NOTE — Patient Instructions (Signed)
Hold Valsartan for 4-7 days to see if the itching is better

## 2016-12-30 NOTE — Assessment & Plan Note (Addendum)
Worse. Allergic vs metabolic vs other Hydroxyzine, Triamc  She thinks it is Valsartan that is making her itch... Hold Valsartan for 4-7 days to see if the itching is better

## 2016-12-30 NOTE — Assessment & Plan Note (Signed)
.   She thinks it is Valsartan that is making her itch...- hold x 1 wk

## 2016-12-30 NOTE — Telephone Encounter (Signed)
Patient thought she had an appointment in July however, she did not. I scheduled the patient for September and copied her to the wait list. Does the patient need to come before September? If so call patient to schedule or call her to advise that the September appointment is fine.

## 2016-12-31 ENCOUNTER — Telehealth: Payer: Self-pay

## 2016-12-31 NOTE — Telephone Encounter (Signed)
I called and notified the patient that the appointment in September was the soonest appointment Dr.Gherghe had, and I advised that being placed on the waitlist was fine as well. Patient would still like to make sure with Dr.Gherghe when she returns that she is okay with keeping this September appointment.

## 2017-01-07 ENCOUNTER — Other Ambulatory Visit: Payer: Self-pay | Admitting: Internal Medicine

## 2017-01-11 NOTE — Telephone Encounter (Signed)
See below of appointment that was made. Thanks!

## 2017-01-11 NOTE — Telephone Encounter (Signed)
Any openings for sooner that we could try to fit patient in before September? Thank you!

## 2017-01-11 NOTE — Telephone Encounter (Signed)
Scheduled for 7-11

## 2017-01-11 NOTE — Telephone Encounter (Signed)
Her HbA1c is worse >> if there is an opening, I would prefer to see her sooner, if possible.

## 2017-01-13 ENCOUNTER — Ambulatory Visit (INDEPENDENT_AMBULATORY_CARE_PROVIDER_SITE_OTHER): Payer: Medicare Other | Admitting: Internal Medicine

## 2017-01-13 ENCOUNTER — Encounter: Payer: Self-pay | Admitting: Internal Medicine

## 2017-01-13 VITALS — BP 130/80 | HR 79 | Wt 201.0 lb

## 2017-01-13 DIAGNOSIS — N184 Chronic kidney disease, stage 4 (severe): Secondary | ICD-10-CM

## 2017-01-13 DIAGNOSIS — E049 Nontoxic goiter, unspecified: Secondary | ICD-10-CM | POA: Diagnosis not present

## 2017-01-13 DIAGNOSIS — E1122 Type 2 diabetes mellitus with diabetic chronic kidney disease: Secondary | ICD-10-CM | POA: Diagnosis not present

## 2017-01-13 DIAGNOSIS — E1159 Type 2 diabetes mellitus with other circulatory complications: Secondary | ICD-10-CM | POA: Diagnosis not present

## 2017-01-13 NOTE — Patient Instructions (Signed)
Please continue: - Metformin ER 500 mg daily - Glipizide ER 5 mg in am  Please stop at the lab.  Please return in 3 months with your sugar log.

## 2017-01-13 NOTE — Progress Notes (Signed)
Patient ID: IDALIZ TINKLE, female   DOB: Nov 20, 1950, 66 y.o.   MRN: [id number]  HPI: WRETHA LARIS is a 66 y.o.-year-old female, returning for f/u DM2, dx in 2006, insulin-independent, controlled, with complications (CAD - s/p AMI 06/2013, CHF; CKD; PN; DR). Last visit 4 mo ago.  She had a IO steroid inj 10/2016 >> sugars higher.   He has a rash that spread to her legs. She believes this is from [place].   Last hemoglobin A1c was: Lab Results  Component Value Date   HGBA1C 8.5 (H) 11/13/2016   HGBA1C 6.7 05/27/2016   HGBA1C 7.1 02/25/2016  08/21/2016: HbA1c 6.0%.  Pt has refused insulin repeatedly in the past. She started to change her diet after her hemoglobin A1c returned high, at 16.2% in 09/2014. She cut down portions, changed the meal content to include more fiber and less carbs. HbA1c decreased dramatically.  Pt is on: - Metformin ER 500 mg 1x a day - Glipizide ER 5 mg in am - started 03/2015 She tried Tradjenta 5 mg daily in am >> CP with it (started 10/2014) >> had to stop - she did see her cardiologist since then She tried [place] >> decreased [name] and yeast inf. She tried regular metformin >> diarrhea  Pt checks her sugars 2x a day: - am:79, 80-120, 132 >> 66, 69-109 >> 94-119 >> 94-113 >> 84-118 >> 90-120, 128 - 2h after b'fast: n/c  >> 95-136 >> 91-118, 122 >> 94-122 >> 120-134, 152 - before lunch: n/c >> 79-130, 145 >> n/c >> 94-131 >> 114 >> 95, 104 >> 141 - 2h after lunch: n/c >> 122, 127 >> 117, 130 >> 97-128 - before dinner: 85-116, 123 >> 88-120 >> 78, 90-130 >> 90-130 >> 78-128, 140 >> 80-121, 128 - 2h after dinner: n/c >> 102-140 >> 110-147 >> 98-128 >> 98-132, 142 - bedtime: n/c >> 95-143, 166 (fam reunion) >> 99, 114 >> 109-142 >> n/c - nighttime: n/c No lows. Lowest sugar was 78 >> 90 >> 78 >> 80 ; ? hypoglycemia awareness (does not feel CBGs in the 60s) Highest sugar was 166 >> 147 >> 142 >> 152. Glucometer: One Touch Ultra  Pt's meals are: -  Breakfast: oatmeal, cereals, [place] bacon, egg toast - Lunch: sandwich or fruit salad - Dinner: salmon/chicken, Brussel sprouts, other veggies, sweet potatoes or brown rice, spaghetti - Snacks: 2: carrots with dip; yoghurt with fruit, salad at 10 pm, cereals after dinner She still walks for exercise.  - She has CKD, last BUN/creatinine >> [name] 26 - not seeing a nephrologist:  Lab Results  Component Value Date   BUN 50 (H) 07/29/2016   CREATININE 2.25 (H) 07/29/2016  On Irbesartan. - last set of lipids: Lab Results  Component Value Date   CHOL 121 05/18/2016   HDL 60 05/18/2016   LDLCALC 51 05/18/2016   TRIG 48 05/18/2016   CHOLHDL 2.0 05/18/2016  On Crestor 5. - last eye exam was in 02/2016. + DR. + cataracts. Had a steroid injection >> sugars higher. - She has numbness and tingling in her feet. Has PN.   She also has a history of hypertension, GERD, and anemia.  She also has a goiter containing small cysts. Pt denies: - feeling nodules in neck - hoarseness - dysphagia - choking - SOB with lying down  ROS: Constitutional: no weight gain/no weight loss, no fatigue, no subjective hyperthermia, no subjective hypothermia Eyes: no blurry vision, no xerophthalmia ENT: no sore throat, no  nodules palpated in throat, no dysphagia, no odynophagia, no hoarseness Cardiovascular: no CP/no SOB/no palpitations+ leg swelling Respiratory: no cough/no SOB/no wheezing Gastrointestinal: no N/no V/no D/no C/no acid reflux Musculoskeletal: + muscle aches/no joint aches Skin: + rash, no hair loss, + itching Neurological: no tremors/no numbness/no tingling/no dizziness  I reviewed pt's medications, allergies, PMH, social hx, family hx, and changes were documented in the history of present illness. Otherwise, unchanged from my initial visit note.  Past Medical History:  Diagnosis Date  . Anemia   . COPD (chronic obstructive pulmonary disease) ([place])   . Diabetes mellitus   . Hyperlipidemia    . Hypertension   . Ischemic cardiomyopathy 06/11/2013  . Low back pain   . NSTEMI (non-ST elevated myocardial infarction) ([place]) 06/11/2013  . Obesity   . Personal history of colon cancer   . Renal insufficiency    Past Surgical History:  Procedure Laterality Date  . COLECTOMY    . LEFT HEART CATHETERIZATION WITH CORONARY ANGIOGRAM N/A 06/09/2013   Procedure: LEFT HEART CATHETERIZATION WITH CORONARY ANGIOGRAM;  Surgeon: Ramond Dial, MD;  Location[facility] CATH LAB;  Service: Cardiovascular;  Laterality: N/A;   History   Social History  . Marital Status: Single    Spouse Name: N/A  . Number of Children: 0   Occupational History  . retired   Social History Main Topics  . Smoking status:  former smoker     Types: Cigarettes  . Smokeless tobacco: Never Used  . Alcohol Use: No  . Drug Use: No   Current Outpatient Prescriptions on File Prior to Visit  Medication Sig Dispense Refill  . amLODipine (NORVASC) 10 MG tablet TAKE ONE-HALF TABLET BY  MOUTH DAILY 45 tablet 2  . aspirin 81 MG chewable tablet Chew 1 tablet (81 mg total) by mouth daily.    . calcium carbonate (OS-CAL) 600 MG TABS tablet Take 600 mg by mouth 2 (two) times daily with a meal.    . fluticasone (FLONASE) 50 MCG/ACT nasal spray Place 2 sprays into both nostrils daily. 48 g 3  . furosemide (LASIX) 80 MG tablet Take 0.5 tablets (40 mg total) by mouth daily. 45 tablet 1  . glipiZIDE (GLUCOTROL XL) 5 MG 24 hr tablet TAKE 1 TABLET BY MOUTH  DAILY WITH BREAKFAST 90 tablet 2  . hydrOXYzine (ATARAX/VISTARIL) 25 MG tablet Take 1-2 tablets (25-50 mg total) by mouth every 8 (eight) hours as needed for itching. 60 tablet 0  . loratadine (CLARITIN) 10 MG tablet Take 1 tablet (10 mg total) by mouth daily. 100 tablet 3  . metFORMIN (GLUCOPHAGE-XR) 500 MG 24 hr tablet TAKE 1 TABLET BY MOUTH TWO  TIMES DAILY 180 tablet 2  . nebivolol (BYSTOLIC) 10 MG tablet Take 1 tablet (10 mg total) by mouth daily. 30 tablet 11  . nitroGLYCERIN  (NITROSTAT) 0.4 MG SL tablet Dissolve 1 tablet under the tongue every 5 minutes as  needed for chest pain 50 tablet 1  . ONE TOUCH ULTRA TEST test strip USE TO TEST BLOOD SUGAR TWO TIMES A DAY AS DIRECTED 200 each 3  . potassium chloride SA (K-DUR,KLOR-CON) 20 MEQ tablet TAKE 1 TABLET BY MOUTH  DAILY 90 tablet 2  . ranitidine (ZANTAC) 150 MG tablet Take 1 tablet (150 mg total) by mouth 2 (two) times daily. 60 tablet 11  . rosuvastatin (CRESTOR) 5 MG tablet TAKE 1 TABLET BY MOUTH  DAILY AT 6PM 90 tablet 2  . traMADol-acetaminophen (ULTRACET) 37.5-325 MG tablet Take  0.5-1 tablets by mouth every 8 (eight) hours as needed for severe pain. 100 tablet 2  . triamcinolone cream (KENALOG) 0.1 % Apply 1 application topically 4 (four) times daily. 450 g 3  . valsartan (DIOVAN) 160 MG tablet Take 1 tablet (160 mg total) by mouth daily. 30 tablet 11  . vitamin B-12 (CYANOCOBALAMIN) 1000 MCG tablet Take 1,000 mcg by mouth daily.     No current facility-administered medications on file prior to visit.    Allergies  Allergen Reactions  . Tradjenta [Linagliptin] Anaphylaxis    CP  . Atorvastatin     REACTION: aches and pains  . Enalapril Maleate     REACTION: cough  . Farxiga [Dapagliflozin] Itching  . Hydrochlorothiazide     REACTION: hair loss  . Kenalog [Triamcinolone Acetonide]     HANDS NUMB   . Metformin And Related     Diarrhea, dizziness  . Propoxyphene N-Acetaminophen Hives  . Simvastatin     REACTION: cramps  . Spironolactone     REACTION: cramps   Family History  Problem Relation Age of Onset  . Hypertension Unknown    PE: BP 130/80 (BP Location: Left Arm, Patient Position: Sitting)   Pulse 79   Wt 201 lb (91.2 kg)   SpO2 96%   BMI 36.76 kg/m  Body mass index is 36.76 kg/m. Wt Readings from Last 3 Encounters:  01/13/17 201 lb (91.2 kg)  12/30/16 199 lb (90.3 kg)  12/08/16 200 lb (90.7 kg)   Constitutional: overweight, in NAD Eyes: PERRLA, EOMI, no exophthalmos ENT:  moist mucous membranes, + symmetric thyromegaly, no cervical lymphadenopathy Cardiovascular: RRR, + 1/6 SEM, + B LE edema L>R Respiratory: CTA B Gastrointestinal: abdomen soft, NT, ND, BS+ Musculoskeletal: no deformities, strength intact in all 4 Skin: moist, warm, no rashes Neurological: no tremor with outstretched hands, DTR normal in all 4   ASSESSMENT: 1. DM2, insulin-independent, controlled, with complications - CAD, s/p AMI 06/2013 - Dr Acie Fredrickson - CHF - CKD - PN - DR - Dr. Jalene Mullet ([facility]) - on IO injections >> improving  2. Goiter - No neck compression symptoms  - 10/19/2014: thyroid ultrasound: Right thyroid lobe: 4.0 x 1.5 x 1.6 cm. Heterogeneous parenchyma with multiple small cysts identified. The largest measures approximately 0.5 x 0.3 x 0.5 cm. All of the small cysts in the right lobe appears simple and benign.  Left thyroid lobe: 4.5 x 1.4 x 2.0 cm. Heterogeneous parenchyma with multiple cysts. The largest measures approximately 1.0 x 0.4 x 0.5 cm. 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 Similar smaller cyst measures 0.8 cm in greatest diameter. There is a small solid nodule measuring 0.7 x 0.4 x 0.6 cm.  Isthmus Thickness: 0.4 cm. No nodules visualized.  Lymphadenopathy: None visualized.          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 No intervention needed.  3. CKD  PLAN:  1. Patient with long-standing, prev. uncontrolled diabetes, with worsened control. We had to decrease Metformin at last visit 2/2 declining [name]. - Last HbA1c checked 2 mo ago was higher, at 8.5%... However, sugars are now better, despite the decrease in Metformin. No need to increase the medicine or add insulin. However, I will check her [name] >> may need to stop Metformin. - I suggested to:  Patient Instructions  Please continue: - Metformin ER 500 mg daily - Glipizide ER 5 mg in am  Please stop at the lab.  Please return in 3 months with  your  sugar log.   - continue checking sugars at different times of the day - check 1-2x a day, rotating checks - advised for yearly eye exams >> she is UTD - Return to clinic in 3 mo with sugar log   2. Goiter - no neck compression sxs - small thyroid cysts per last U/S in 10/2014 - no further f/u is necessary unless she develops neck compression sxs  3. 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 >> I suggested to see nephrology >> Will recheck [name] first  Component     Latest Ref Rng & Units 07/29/2016 01/13/2017           Sodium     135 - 146 mmol/L 139 136  Potassium     3.5 - 5.3 mmol/L 4.9 4.8  Chloride     98 - 110 mmol/L 105 105  CO2     20 - 31 mmol/L 20 21  Glucose     65 - 99 mg/dL 168 (H) 151 (H)  BUN     7 - 25 mg/dL 50 (H) 41 (H)  Creatinine     0.50 - 0.99 mg/dL 2.25 (H) 2.16 (H)  Calcium     8.6 - 10.4 mg/dL 9.6 9.5  [name], Est Non African American     >=60 mL/min 22 (L) 23 (L)  [name], Est African American     >=60 mL/min 26 (L) 27 (L)   [name] still low (stage 4) >> would d/w PCP regarding a referral to nephrology. Will need to stop Metformin >> will advise her to let me know if sugars higher, may need to increase Glipizide. She may need to come off Irbesartan >> per PCP.  Philemon Kingdom, MD PhD [facility] Endocrinology

## 2017-01-14 ENCOUNTER — Telehealth: Payer: Self-pay

## 2017-01-14 LAB — BASIC METABOLIC PANEL WITH GFR
BUN: 41 mg/dL — ABNORMAL HIGH (ref 7–25)
CO2: 21 mmol/L (ref 20–31)
Calcium: 9.5 mg/dL (ref 8.6–10.4)
Chloride: 105 mmol/L (ref 98–110)
Creat: 2.16 mg/dL — ABNORMAL HIGH (ref 0.50–0.99)
GFR, Est African American: 27 mL/min — ABNORMAL LOW (ref >=60)
GFR, Est Non African American: 23 mL/min — ABNORMAL LOW (ref >=60)
Glucose, Bld: 151 mg/dL — ABNORMAL HIGH (ref 65–99)
Potassium: 4.8 mmol/L (ref 3.5–5.3)
Sodium: 136 mmol/L (ref 135–146)

## 2017-01-14 NOTE — Telephone Encounter (Signed)
-----   Message from Philemon Kingdom, MD sent at 01/14/2017 12:41 PM EDT ----- [name], can you please call pt: [name] still low (stage 4) >> I will d/w her PCP regarding a referral to nephrology. Will need to stop Metformin >> please advise her to let me know if sugars higher, may need to increase Glipizide. She may need to come off Irbesartan >> per PCP.

## 2017-01-14 NOTE — Telephone Encounter (Signed)
Called patient and gave lab results. Patient had no questions or concerns.  

## 2017-01-31 ENCOUNTER — Other Ambulatory Visit: Payer: Self-pay | Admitting: Internal Medicine

## 2017-02-08 DIAGNOSIS — H04123 Dry eye syndrome of bilateral lacrimal glands: Secondary | ICD-10-CM | POA: Diagnosis not present

## 2017-02-08 DIAGNOSIS — E113512 Type 2 diabetes mellitus with proliferative diabetic retinopathy with macular edema, left eye: Secondary | ICD-10-CM | POA: Diagnosis not present

## 2017-02-08 DIAGNOSIS — H25813 Combined forms of age-related cataract, bilateral: Secondary | ICD-10-CM | POA: Diagnosis not present

## 2017-02-08 DIAGNOSIS — H35033 Hypertensive retinopathy, bilateral: Secondary | ICD-10-CM | POA: Diagnosis not present

## 2017-02-08 DIAGNOSIS — E113412 Type 2 diabetes mellitus with severe nonproliferative diabetic retinopathy with macular edema, left eye: Secondary | ICD-10-CM | POA: Diagnosis not present

## 2017-02-08 DIAGNOSIS — E113511 Type 2 diabetes mellitus with proliferative diabetic retinopathy with macular edema, right eye: Secondary | ICD-10-CM | POA: Diagnosis not present

## 2017-02-19 ENCOUNTER — Encounter: Payer: Self-pay | Admitting: Cardiovascular Disease

## 2017-02-19 ENCOUNTER — Ambulatory Visit (INDEPENDENT_AMBULATORY_CARE_PROVIDER_SITE_OTHER): Payer: Medicare Other | Admitting: Cardiovascular Disease

## 2017-02-19 ENCOUNTER — Encounter (INDEPENDENT_AMBULATORY_CARE_PROVIDER_SITE_OTHER): Payer: Self-pay

## 2017-02-19 VITALS — BP 126/64 | HR 87 | Ht 62.0 in | Wt 203.2 lb

## 2017-02-19 DIAGNOSIS — R600 Localized edema: Secondary | ICD-10-CM | POA: Diagnosis not present

## 2017-02-19 DIAGNOSIS — I5042 Chronic combined systolic (congestive) and diastolic (congestive) heart failure: Secondary | ICD-10-CM

## 2017-02-19 MED ORDER — LOSARTAN POTASSIUM 50 MG PO TABS: 50.0000 mg | ORAL_TABLET | Freq: Every day | ORAL | 11 refills | Status: DC

## 2017-02-19 NOTE — Progress Notes (Signed)
Cardiology Office Note   Date:  02/19/2017   ID:  [name], DOB 1951-01-01, MRN [id number]  PCP:  [name], MD  Cardiologist:   Mertie Moores, MD   Chief Complaint  Patient presents with  . Follow-up    chronic systolic CHF   1. Coronary artery disease-status post PTCA and stenting of her mid LAD using a 3.5 x 15 mm Alpine stent ( DES) ( Dec. 5, 2014)  2. chronic systolic congestive heart failure-ejection fraction of 35% 2. Hypertension 3. COPD 4. Diabetes mellitus 5. History colon cancer    [name] is seen today. She was initially seen by Dr. Mare Ferrari in the hospital. She has a history of chronic systolic congestive heart failure with an ejection fraction of around 35%. She has a history of coronary artery disease. She is status post PTCA and stenting of her mid left anterior descending artery using a 3.5 x 15 mm Alpine stent. She has a history of COPD. [name] not had a cigarette since he left the hospital. She also has a history of diabetes mellitus and history of colon cancer  She has mild interscapular pain this morning . And the pain resolved after she drank some club soda. Did not feel like her MI pain.  She has been walking every morining at the mall and at [place].   Her BP is up today. - she may have had lots of salt last night. The BP has remained high   September 20, 2013  [name] has been having some episodes of chest discomfort. She initially thought it might be due to indigestion or due to something that she ate. It radiated to her interscapular region. It was associated with some hand and normal tingling. The pain was intermittent but lasted for several hours. She took a sublingual nitroglycerin and the pain resolved very quickly and she did not have any further episodes the whole rest of the day.  She informed that she ran out of her Lasix 3 days ago.  November 01, 2013:  [name] is feeling well. She stopped smoking the day of her  cath. She is eating more "penny candy" to replace the habit.   January 31, 2014:  She is doing ok. She has no angina. Not exercising. 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  She has a bruise on her leg where she bumped into a table.   Oct. 29, 2015:  [name] is doing very well. She is not having any episodes of chest pain. [name] not exercising quite as much as she would like. Stent was placed in December of 2014. She had a nose bleed last week. ( she is on Effient until Dec. 2015). Her Bp was A bit elevated because of some salt in her diet.  No CP or dyspnea    November 02, 2014:  [name] is a 66 y.o. female who presents for follow-up of her coronary artery disease. [name] done fairly well. She has had some muscle pain and a rash. She has occasional episodes of dizziness. She had some non-cardiac CP several weeks ago when she started Trajenta.  Stopped the med and has not had any further episodes of CP.    Oct. 26, 2016  [name] is doing well She had a echo recently   Left ventricle: The cavity size was normal. Wall thickness was increased in a pattern of mild LVH with focal basal septal hypertrophy. Systolic function was normal. The estimated ejection fraction was  in the range of 60% to 65%. Wall motion was normal; there were no regional wall motion abnormalities. Doppler parameters are consistent with abnormal left ventricular relaxation (grade 1 diastolic dysfunction). - Aortic valve: There was no stenosis. There was trivial regurgitation. - Mitral valve: Mildly calcified annulus. Mildly calcified leaflets . There was no significant regurgitation. - Right ventricle: The cavity size was normal. Systolic function was normal. - Tricuspid valve: Peak RV-RA gradient (S): 21 mm Hg. - Pulmonary arteries: PA peak pressure: 24 mm Hg (S). - Inferior vena cava: The vessel was normal in size. The respirophasic diameter changes were in the normal range (>=  50%), consistent with normal central venous pressure  [name] is doing well  BP has been elevated for the past 2 weeks.  Has had some left arm pain / ached Left arm aches all the time,  Constant.  Worse with sitting, not worsened by exercise .   October 28, 2015:  [name] is doing well.  She went with [facility] - it took 2 weeks for them to get her meds to her .  Has chronic left arm pain .  Oct. 23, 2017:  Doing well from a cardiac standpoint Having eye surgery . Has had a chronic cough [name], Evie Lacks, MD changed her Coreg to [facility] Did not seem to help the cough   Jan. 24, 2018:  doing well. Still eating some salty foods.  Still smoking some,  Advised her to quit.  Echo from 2016 shows normal LV systolic function Needs to have an contrast imaging study of her eye. She is at low risk for that procedure.   Aug. 17, 2018:  [name] is seen back today for follow up of her CHF   Past Medical History:  Diagnosis Date  . Anemia   . COPD (chronic obstructive pulmonary disease) ([place])   . Diabetes mellitus   . Hyperlipidemia   . Hypertension   . Ischemic cardiomyopathy 06/11/2013  . Low back pain   . NSTEMI (non-ST elevated myocardial infarction) ([place]) 06/11/2013  . Obesity   . Personal history of colon cancer   . Renal insufficiency     Past Surgical History:  Procedure Laterality Date  . COLECTOMY    . LEFT HEART CATHETERIZATION WITH CORONARY ANGIOGRAM N/A 06/09/2013   Procedure: LEFT HEART CATHETERIZATION WITH CORONARY ANGIOGRAM;  Surgeon: Ramond Dial, MD;  Location: [facility] C[facility] LAB;  Service: Cardiovascular;  Laterality: N/A;     Current Outpatient Prescriptions  Medication Sig Dispense Refill  . amLODipine (NORVASC) 10 MG tablet TAKE ONE-HALF TABLET BY  MOUTH DAILY 45 tablet 2  . aspirin 81 MG chewable tablet Chew 1 tablet (81 mg total) by mouth daily.    [name] BYSTOLIC 10 MG tablet TAKE 1 TABLET BY MOUTH DAILY 30 tablet 3  . calcium carbonate (OS-CAL) 600 MG  TABS tablet Take 600 mg by mouth 2 (two) times daily with a meal.    . fluticasone (FLONASE) 50 MCG/ACT nasal spray Place 2 sprays into both nostrils daily. 48 g 3  . furosemide (LASIX) 80 MG tablet Take 0.5 tablets (40 mg total) by mouth daily. 45 tablet 1  . glipiZIDE (GLUCOTROL XL) 5 MG 24 hr tablet TAKE 1 TABLET BY MOUTH  DAILY WITH BREAKFAST 90 tablet 2  . hydrOXYzine (ATARAX/VISTARIL) 25 MG tablet Take 1-2 tablets (25-50 mg total) by mouth every 8 (eight) hours as needed for itching. 60 tablet 0  . loratadine (CLARITIN) 10 MG tablet Take 1  tablet (10 mg total) by mouth daily. 100 tablet 3  . nitroGLYCERIN (NITROSTAT) 0.4 MG SL tablet Dissolve 1 tablet under the tongue every 5 minutes as  needed for chest pain 50 tablet 1  . ONE TOUCH ULTRA TEST test strip USE TO TEST BLOOD SUGAR TWO TIMES A DAY AS DIRECTED 200 each 3  . potassium chloride SA (K-DUR,KLOR-CON) 20 MEQ tablet TAKE 1 TABLET BY MOUTH  DAILY 90 tablet 2  . ranitidine (ZANTAC) 150 MG tablet Take 1 tablet (150 mg total) by mouth 2 (two) times daily. 60 tablet 11  . rosuvastatin (CRESTOR) 5 MG tablet TAKE 1 TABLET BY MOUTH  DAILY AT 6PM 90 tablet 2  . traMADol-acetaminophen (ULTRACET) 37.5-325 MG tablet Take 0.5-1 tablets by mouth every 8 (eight) hours as needed for severe pain. 100 tablet 2  . triamcinolone cream (KENALOG) 0.1 % Apply 1 application topically 4 (four) times daily. 450 g 3  . valsartan (DIOVAN) 160 MG tablet Take 1 tablet (160 mg total) by mouth daily. 30 tablet 11  . vitamin B-12 (CYANOCOBALAMIN) 1000 MCG tablet Take 1,000 mcg by mouth daily.     No current facility-administered medications for this visit.     Allergies:   Tradjenta [linagliptin]; Atorvastatin; Enalapril maleate; Farxiga [dapagliflozin]; Hydrochlorothiazide; Kenalog [triamcinolone acetonide]; Metformin and related; Propoxyphene n-acetaminophen; Simvastatin; and Spironolactone    Social History:  The patient  reports that she has been smoking  Cigarettes.  She has never used smokeless tobacco. She reports that she does not drink alcohol or use drugs.   Family History:  The patient's family history includes Hypertension in her unknown relative.    ROS:  Please see the history of present illness.    Review of Systems: Constitutional:  denies fever, chills, diaphoresis, appetite change and fatigue.  HEENT: denies photophobia, eye pain, redness, hearing loss, ear pain, congestion, sore throat, rhinorrhea, sneezing, neck pain, neck stiffness and tinnitus.  Respiratory: denies SOB, DOE, cough, chest tightness, and wheezing.  Cardiovascular: denies chest pain, palpitations and leg swelling.  Gastrointestinal: denies nausea, vomiting, abdominal pain, diarrhea, constipation, blood in stool.  Genitourinary: denies dysuria, urgency, frequency, hematuria, flank pain and difficulty urinating.  Musculoskeletal: denies  myalgias, back pain, joint swelling, arthralgias and gait problem.   Skin: denies pallor, rash and wound.  Neurological: denies dizziness, seizures, syncope, weakness, light-headedness, numbness and headaches.   Hematological: denies adenopathy, easy bruising, personal or family bleeding history.  Psychiatric/ Behavioral: denies suicidal ideation, mood changes, confusion, nervousness, sleep disturbance and agitation.       All other systems are reviewed and negative.    PHYSICAL EXAM: VS:  BP 126/64   Pulse 87   Ht 5\' 2"  (1.575 m)   Wt 203 lb 3.2 oz (92.2 kg)   SpO2 98%   BMI 37.17 kg/m  , BMI Body mass index is 37.17 kg/m. GEN: Well nourished, well developed, in no acute distress  HEENT: normal,  Mild thyromegaly   Neck: no JVD, carotid bruits, or masses Cardiac: RRR; no murmurs, rubs, or gallops,no edema  Respiratory:  clear to auscultation bilaterally, normal work of breathing GI: soft, nontender, nondistended, + BS MS: 1-2+ edema of left lower leg. Skin: warm and dry, no rash Neuro:  Strength and sensation  are intact Psych: normal   EKG:  EKG is ordered today. The ekg ordered today demonstrates NSR at 79.  No ST or T wave abn.   Recent Labs: 11/13/2016: ALT 12; Hemoglobin 10.0; Platelets 144.0 01/13/2017: BUN 41;  Creat 2.16; Potassium 4.8; Sodium 136    Lipid Panel    Component Value Date/Time   CHOL 121 05/18/2016 1005   TRIG 48 05/18/2016 1005   HDL 60 05/18/2016 1005   CHOLHDL 2.0 05/18/2016 1005   VLDL 10 05/18/2016 1005   LDLCALC 51 05/18/2016 1005      Wt Readings from Last 3 Encounters:  02/19/17 203 lb 3.2 oz (92.2 kg)  01/13/17 201 lb (91.2 kg)  12/30/16 199 lb (90.3 kg)      Other studies Reviewed: Additional studies/ records that were reviewed today include: . Review of the above records demonstrates:    ASSESSMENT AND PLAN:  1. Coronary artery disease-status post PTCA and stenting of her mid LAD using a 3.5 x 15 mm Alpine stent ( DES) ( Dec. 5, 2014) she's not had any episodes of angina. We will continue aggressive treatment of her lipids. Continue other medications.   2. chronic systolic congestive heart failure- initial ejection fraction of 35% - her ejection fraction has now normalized.    2. Hypertension -   On Bystolic instead of Coreg.   The change did not seem to help her cough   Will DC Valsartan ( on recall )  Start Losartan 50 mg a day   3.  Left leg edema:   Will get a venous duplex scan to rule out DVT.   4. Diabetes mellitus 5. History colon cancer 6. COPD - encouraged smoking cessation   Current medicines are reviewed at length with the patient today.  The patient does not have concerns regarding medicines.  The following changes have been made:  Increase coreg to 25 BID    Labs/ tests ordered today include:   No orders of the defined types were placed in this encounter.       Mertie Moores, MD  02/19/2017 11:22 AM    [facility] [address], [place], [place]  [address] Phone: [phone]; Fax: [phone]

## 2017-02-19 NOTE — Patient Instructions (Signed)
Medication Instructions:  STOP Valsartan (Diovan) START Losartan (Cozaar) 50 mg once daily   Labwork: Your physician recommends that you return for lab work in: 3 weeks for basic metabolic panel   Testing/Procedures: Your physician has requested that you have a lower extremity venous duplex. This test is an ultrasound of the veins in the legs or arms. It looks at venous blood flow that carries blood from the heart to the legs or arms. Allow one hour for a Lower Venous exam. Allow thirty minutes for an Upper Venous exam. There are no restrictions or special instructions.    Follow-Up: Your physician wants you to follow-up in: 6 months with Dr. Acie Fredrickson.  You will receive a reminder letter in the mail two months in advance. If you don't receive a letter, please call our office to schedule the follow-up appointment.   If you need a refill on your cardiac medications before your next appointment, please call your pharmacy.   Thank you for choosing [facility]! Christen Bame, RN [phone]

## 2017-03-01 ENCOUNTER — Ambulatory Visit (HOSPITAL_COMMUNITY)
Admission: RE | Admit: 2017-03-01 | Discharge: 2017-03-01 | Disposition: A | Discharge status: Home / Self Care | Payer: Medicare Other | Source: Ambulatory Visit | Attending: Internal Medicine | Admitting: Internal Medicine

## 2017-03-01 DIAGNOSIS — R600 Localized edema: Secondary | ICD-10-CM | POA: Diagnosis not present

## 2017-03-01 DIAGNOSIS — I5042 Chronic combined systolic (congestive) and diastolic (congestive) heart failure: Secondary | ICD-10-CM | POA: Diagnosis not present

## 2017-03-01 DIAGNOSIS — R938 Abnormal findings on diagnostic imaging of other specified body structures: Secondary | ICD-10-CM | POA: Diagnosis not present

## 2017-03-12 ENCOUNTER — Other Ambulatory Visit: Payer: Medicare Other | Admitting: *Deleted

## 2017-03-12 DIAGNOSIS — I5042 Chronic combined systolic (congestive) and diastolic (congestive) heart failure: Secondary | ICD-10-CM | POA: Diagnosis not present

## 2017-03-12 DIAGNOSIS — R600 Localized edema: Secondary | ICD-10-CM | POA: Diagnosis not present

## 2017-03-12 LAB — BASIC METABOLIC PANEL
BUN/Creatinine Ratio: 18 (ref 12–28)
BUN: 40 mg/dL — ABNORMAL HIGH (ref 8–27)
CO2: 17 mmol/L — ABNORMAL LOW (ref 20–29)
Calcium: 9.4 mg/dL (ref 8.7–10.3)
Chloride: 104 mmol/L (ref 96–106)
Creatinine, Ser: 2.26 mg/dL — ABNORMAL HIGH (ref 0.57–1.00)
GFR calc Af Amer: 25 mL/min/{1.73_m2} — ABNORMAL LOW (ref >59)
GFR calc non Af Amer: 22 mL/min/{1.73_m2} — ABNORMAL LOW (ref >59)
Glucose: 205 mg/dL — ABNORMAL HIGH (ref 65–99)
Potassium: 4.9 mmol/L (ref 3.5–5.2)
Sodium: 137 mmol/L (ref 134–144)

## 2017-03-15 ENCOUNTER — Telehealth: Payer: Self-pay | Admitting: Cardiovascular Disease

## 2017-03-15 DIAGNOSIS — E113511 Type 2 diabetes mellitus with proliferative diabetic retinopathy with macular edema, right eye: Secondary | ICD-10-CM | POA: Diagnosis not present

## 2017-03-15 DIAGNOSIS — E113512 Type 2 diabetes mellitus with proliferative diabetic retinopathy with macular edema, left eye: Secondary | ICD-10-CM | POA: Diagnosis not present

## 2017-03-15 NOTE — Telephone Encounter (Signed)
Spoke with patient and advised her of stable lab results and continue current medications. She verbalized understanding and thanked me for the call.

## 2017-03-15 NOTE — Telephone Encounter (Signed)
New Message     Pt is returning your call for lab results

## 2017-03-17 ENCOUNTER — Ambulatory Visit (INDEPENDENT_AMBULATORY_CARE_PROVIDER_SITE_OTHER): Payer: Medicare Other | Admitting: Internal Medicine

## 2017-03-17 ENCOUNTER — Encounter: Payer: Self-pay | Admitting: Internal Medicine

## 2017-03-17 DIAGNOSIS — R600 Localized edema: Secondary | ICD-10-CM

## 2017-03-17 DIAGNOSIS — N184 Chronic kidney disease, stage 4 (severe): Secondary | ICD-10-CM

## 2017-03-17 DIAGNOSIS — I2510 Atherosclerotic heart disease of native coronary artery without angina pectoris: Secondary | ICD-10-CM | POA: Diagnosis not present

## 2017-03-17 DIAGNOSIS — I5042 Chronic combined systolic (congestive) and diastolic (congestive) heart failure: Secondary | ICD-10-CM

## 2017-03-17 DIAGNOSIS — E1122 Type 2 diabetes mellitus with diabetic chronic kidney disease: Secondary | ICD-10-CM

## 2017-03-17 DIAGNOSIS — R609 Edema, unspecified: Secondary | ICD-10-CM | POA: Insufficient documentation

## 2017-03-17 DIAGNOSIS — Z6837 Body mass index (BMI) 37.0-37.9, adult: Secondary | ICD-10-CM

## 2017-03-17 MED ORDER — NEBIVOLOL HCL 10 MG PO TABS: 10.0000 mg | ORAL_TABLET | Freq: Every day | ORAL | 3 refills | Status: DC

## 2017-03-17 MED ORDER — FUROSEMIDE 80 MG PO TABS: 40.0000 mg | ORAL_TABLET | Freq: Every day | ORAL | 1 refills | Status: DC

## 2017-03-17 MED ORDER — LOSARTAN POTASSIUM 50 MG PO TABS: 50.0000 mg | ORAL_TABLET | Freq: Every day | ORAL | 3 refills | Status: DC

## 2017-03-17 MED ORDER — POTASSIUM CHLORIDE CRYS ER 20 MEQ PO TBCR: 20.0000 meq | EXTENDED_RELEASE_TABLET | Freq: Every day | ORAL | 3 refills | Status: DC

## 2017-03-17 MED ORDER — GLIPIZIDE ER 5 MG PO TB24: 5.0000 mg | ORAL_TABLET | Freq: Every day | ORAL | 2 refills | Status: DC

## 2017-03-17 MED ORDER — AMLODIPINE BESYLATE 10 MG PO TABS: 5.0000 mg | ORAL_TABLET | Freq: Every day | ORAL | 2 refills | Status: DC

## 2017-03-17 MED ORDER — NITROGLYCERIN 0.4 MG SL SUBL: SUBLINGUAL_TABLET | SUBLINGUAL | 3 refills | Status: DC

## 2017-03-17 NOTE — Assessment & Plan Note (Signed)
Furosemide, Coreg, Avapro

## 2017-03-17 NOTE — Patient Instructions (Signed)
Compression socks/knee highs for swelling Elevate feet

## 2017-03-17 NOTE — Assessment & Plan Note (Signed)
B LEs L>R Compression socks/knee highs for swelling

## 2017-03-17 NOTE — Assessment & Plan Note (Signed)
Dr Acie Fredrickson Aspirin, Crestor NTG prn

## 2017-03-17 NOTE — Assessment & Plan Note (Signed)
Losartan Monitor BMET

## 2017-03-17 NOTE — Progress Notes (Signed)
Subjective:  Patient ID: [name], female    DOB: 15-May-1951  Age: 66 y.o. MRN: [id number]  CC: No chief complaint on file.   HPI [name] presents for   Outpatient Medications Prior to Visit  Medication Sig Dispense Refill  . amLODipine (NORVASC) 10 MG tablet TAKE ONE-HALF TABLET BY  MOUTH DAILY 45 tablet 2  . aspirin 81 MG chewable tablet Chew 1 tablet (81 mg total) by mouth daily.    [name] BYSTOLIC 10 MG tablet TAKE 1 TABLET BY MOUTH DAILY 30 tablet 3  . calcium carbonate (OS-CAL) 600 MG TABS tablet Take 600 mg by mouth 2 (two) times daily with a meal.    . fluticasone (FLONASE) 50 MCG/ACT nasal spray Place 2 sprays into both nostrils daily. 48 g 3  . furosemide (LASIX) 80 MG tablet Take 0.5 tablets (40 mg total) by mouth daily. 45 tablet 1  . glipiZIDE (GLUCOTROL XL) 5 MG 24 hr tablet TAKE 1 TABLET BY MOUTH  DAILY WITH BREAKFAST 90 tablet 2  . hydrOXYzine (ATARAX/VISTARIL) 25 MG tablet Take 1-2 tablets (25-50 mg total) by mouth every 8 (eight) hours as needed for itching. 60 tablet 0  . loratadine (CLARITIN) 10 MG tablet Take 1 tablet (10 mg total) by mouth daily. 100 tablet 3  . losartan (COZAAR) 50 MG tablet Take 1 tablet (50 mg total) by mouth daily. 30 tablet 11  . nitroGLYCERIN (NITROSTAT) 0.4 MG SL tablet Dissolve 1 tablet under the tongue every 5 minutes as  needed for chest pain 50 tablet 1  . ONE TOUCH ULTRA TEST test strip USE TO TEST BLOOD SUGAR TWO TIMES A DAY AS DIRECTED 200 each 3  . potassium chloride SA (K-DUR,KLOR-CON) 20 MEQ tablet TAKE 1 TABLET BY MOUTH  DAILY 90 tablet 2  . ranitidine (ZANTAC) 150 MG tablet Take 1 tablet (150 mg total) by mouth 2 (two) times daily. 60 tablet 11  . rosuvastatin (CRESTOR) 5 MG tablet TAKE 1 TABLET BY MOUTH  DAILY AT 6PM 90 tablet 2  . traMADol-acetaminophen (ULTRACET) 37.5-325 MG tablet Take 0.5-1 tablets by mouth every 8 (eight) hours as needed for severe pain. 100 tablet 2  . triamcinolone cream (KENALOG) 0.1 %  Apply 1 application topically 4 (four) times daily. 450 g 3  . vitamin B-12 (CYANOCOBALAMIN) 1000 MCG tablet Take 1,000 mcg by mouth daily.     No facility-administered medications prior to visit.     ROS Review of Systems  Constitutional: Negative for activity change, appetite change, chills, fatigue and unexpected weight change.  HENT: Negative for congestion, mouth sores and sinus pressure.   Eyes: Negative for visual disturbance.  Respiratory: Negative for cough and chest tightness.   Gastrointestinal: Negative for abdominal pain and nausea.  Genitourinary: Negative for difficulty urinating, frequency and vaginal pain.  Musculoskeletal: Positive for back pain. Negative for gait problem.  Skin: Negative for pallor and rash.  Neurological: Negative for dizziness, tremors, weakness, numbness and headaches.  Psychiatric/Behavioral: Negative for confusion and sleep disturbance.    Objective:  BP 130/74 (BP Location: Left Arm, Patient Position: Sitting, Cuff Size: Large)   Pulse 74   Temp 98 F (36.7 C) (Oral)   Ht 5\' 2"  (1.575 m)   Wt 203 lb (92.1 kg)   SpO2 99%   BMI 37.13 kg/m   BP Readings from Last 3 Encounters:  03/17/17 130/74  02/19/17 126/64  01/13/17 130/80    Wt Readings from Last 3 Encounters:  03/17/17 203  lb (92.1 kg)  02/19/17 203 lb 3.2 oz (92.2 kg)  01/13/17 201 lb (91.2 kg)    Physical Exam  Constitutional: She appears well-developed. No distress.  HENT:  Head: Normocephalic.  Right Ear: External ear normal.  Left Ear: External ear normal.  Nose: Nose normal.  Mouth/Throat: Oropharynx is clear and moist.  Eyes: Pupils are equal, round, and reactive to light. Conjunctivae are normal. Right eye exhibits no discharge. Left eye exhibits no discharge.  Neck: Normal range of motion. Neck supple. No JVD present. No tracheal deviation present. No thyromegaly present.  Cardiovascular: Normal rate, regular rhythm and normal heart sounds.   Pulmonary/Chest:  No stridor. No respiratory distress. She has no wheezes.  Abdominal: Soft. Bowel sounds are normal. She exhibits no distension and no mass. There is no tenderness. There is no rebound and no guarding.  Musculoskeletal: She exhibits no edema or tenderness.  Lymphadenopathy:    She has no cervical adenopathy.  Neurological: She displays normal reflexes. No cranial nerve deficit. She exhibits normal muscle tone. Coordination normal.  Skin: No rash noted. No erythema.  Psychiatric: She has a normal mood and affect. Her behavior is normal. Judgment and thought content normal.  L>R ankle edema  Lab Results  Component Value Date   WBC 6.1 11/13/2016   HGB 10.0 (L) 11/13/2016   HCT 30.3 (L) 11/13/2016   PLT 144.0 (L) 11/13/2016   GLUCOSE 205 (H) 03/12/2017   CHOL 121 05/18/2016   TRIG 48 05/18/2016   HDL 60 05/18/2016   LDLCALC 51 05/18/2016   ALT 12 11/13/2016   AST 11 11/13/2016   NA 137 03/12/2017   K 4.9 03/12/2017   CL 104 03/12/2017   CREATININE 2.26 (H) 03/12/2017   BUN 40 (H) 03/12/2017   CO2 17 (L) 03/12/2017   TSH 2.19 10/03/2013   INR 1.16 06/07/2013   HGBA1C 8.5 (H) 11/13/2016   MICROALBUR 13.9 (H) 10/13/2007    No results found.  Assessment & Plan:   There are no diagnoses linked to this encounter. I am having Ms. Rabbani maintain her vitamin B-12, aspirin, calcium carbonate, traMADol-acetaminophen, fluticasone, nitroGLYCERIN, ONE TOUCH ULTRA TEST, ranitidine, triamcinolone cream, hydrOXYzine, loratadine, furosemide, glipiZIDE, potassium chloride SA, amLODipine, rosuvastatin, BYSTOLIC, and losartan.  No orders of the defined types were placed in this encounter.    Follow-up: No Follow-up on file.  Walker Kehr, MD

## 2017-03-17 NOTE — Assessment & Plan Note (Signed)
Wt Readings from Last 3 Encounters:  03/17/17 203 lb (92.1 kg)  02/19/17 203 lb 3.2 oz (92.2 kg)  01/13/17 201 lb (91.2 kg)

## 2017-03-25 ENCOUNTER — Ambulatory Visit: Payer: Medicare Other | Admitting: Internal Medicine

## 2017-03-29 DIAGNOSIS — E113512 Type 2 diabetes mellitus with proliferative diabetic retinopathy with macular edema, left eye: Secondary | ICD-10-CM | POA: Diagnosis not present

## 2017-04-07 ENCOUNTER — Other Ambulatory Visit: Payer: Self-pay | Admitting: Internal Medicine

## 2017-04-13 DIAGNOSIS — E113511 Type 2 diabetes mellitus with proliferative diabetic retinopathy with macular edema, right eye: Secondary | ICD-10-CM | POA: Diagnosis not present

## 2017-04-15 ENCOUNTER — Ambulatory Visit (INDEPENDENT_AMBULATORY_CARE_PROVIDER_SITE_OTHER): Payer: Medicare Other | Admitting: Internal Medicine

## 2017-04-15 ENCOUNTER — Encounter: Payer: Self-pay | Admitting: Internal Medicine

## 2017-04-15 VITALS — BP 146/82 | HR 93 | Ht 62.0 in | Wt 203.0 lb

## 2017-04-15 DIAGNOSIS — E1159 Type 2 diabetes mellitus with other circulatory complications: Secondary | ICD-10-CM

## 2017-04-15 DIAGNOSIS — E049 Nontoxic goiter, unspecified: Secondary | ICD-10-CM | POA: Diagnosis not present

## 2017-04-15 LAB — POCT GLYCOSYLATED HEMOGLOBIN (HGB A1C): Hemoglobin A1C: 8.6

## 2017-04-15 NOTE — Progress Notes (Signed)
Patient ID: [name], female   DOB: 1950-12-26, 66 y.o.   MRN: [id number]  HPI: [name] is a 66 y.o.-year-old female, returning for f/u DM2, dx in 2006, insulin-independent, controlled, with complications (CAD - s/p AMI 06/2013, CHF; CKD; PN; DR). Last visit 3 mo ago.  Last hemoglobin A1c was: Lab Results  Component Value Date   HGBA1C 8.5 (H) 11/13/2016   HGBA1C 6.7 05/27/2016   HGBA1C 7.1 02/25/2016  08/21/2016: HbA1c 6.0%.  Pt has refused insulin repeatedly in the past. She started to change her diet after her hemoglobin A1c returned high, at 16.2% in 09/2014. She cut down portions, changed the meal content to include more fiber and less carbs. HbA1c decreased dramatically.  Pt is on: -  >> stopped 01/2017 - Glipizide ER 5 mg in am - started 03/2015 She tried Tradjenta 5 mg daily in am >> CP with it (started 10/2014) >> had to stop - she did see her cardiologist since then She tried [place] >> decreased [name] and yeast inf. She tried regular metformin >> diarrhea  Pt checks her sugars 2x a day: - am: 94-113 >> 84-118 >> 90-120, 128 >> 103-130 - 2h after b'fast: 94-122 >> 120-134, 152 >> 115-140 - before lunch:94-131 >> 114 >> 95, 104 >> 141 >> 136 - 2h after lunch:117, 130 >> 97-128 >> 132-140 - before dinner:78-128, 140 >> 80-121, 128 >> 118-157 (popcorn before dinner) - 2h after dinner:98-128 >> 98-132, 142 >> 125-152 - bedtime:  99, 114 >> 109-142 >> n/c - nighttime: n/c No lows. Lowest sugar was 80 >> 103 ; ? hypoglycemia awareness Highest sugar was  152 >> 160  Glucometer: One Touch Ultra  Pt's meals are: - Breakfast: oatmeal, cereals, [place] bacon, egg toast - Lunch: sandwich or fruit salad - Dinner: salmon/chicken, Brussel sprouts, other veggies, sweet potatoes or brown rice, spaghetti - Snacks: 2: carrots with dip; yoghurt with fruit, salad at 10 pm, cereals after dinner She still walks for exercise.  - + CKD, last BUN/creatinine - not seeing a  nephrologist yet: Lab Results  Component Value Date   BUN 40 (H) 03/12/2017   CREATININE 2.26 (H) 03/12/2017  On Irbesartan >> now Losartan - last set of lipids: Lab Results  Component Value Date   CHOL 121 05/18/2016   HDL 60 05/18/2016   LDLCALC 51 05/18/2016   TRIG 48 05/18/2016   CHOLHDL 2.0 05/18/2016  On Crestor 5. - last eye exam was in 04/12/2017 >> + DR. + cataracts. Had Laser Sx. - she has numbness and tingling in her feet. Has PN.   She also has a history of HTN, GERD, and anemia.  She also has a goiter containing small cysts. Pt denies: - feeling nodules in neck - hoarseness - dysphagia - choking - SOB with lying down  ROS: Constitutional: no weight gain/no weight loss, no fatigue, no subjective hyperthermia, no subjective hypothermia Eyes: no blurry vision, no xerophthalmia ENT: no sore throat, + see HPI Cardiovascular: no CP/no SOB/no palpitations/no leg swelling Respiratory: + cough/no SOB/no wheezing Gastrointestinal: no N/no V/no D/no C/+ acid reflux Musculoskeletal: +muscle aches/no joint aches Skin: no rashes, no hair loss Neurological: no tremors/+ numbness/+ tingling/no dizziness  I reviewed pt's medications, allergies, PMH, social hx, family hx, and changes were documented in the history of present illness. Otherwise, unchanged from my initial visit note.  Past Medical History:  Diagnosis Date  . Anemia   . COPD (chronic obstructive pulmonary disease) ([place])   .  Diabetes mellitus   . Hyperlipidemia   . Hypertension   . Ischemic cardiomyopathy 06/11/2013  . Low back pain   . NSTEMI (non-ST elevated myocardial infarction) ([place]) 06/11/2013  . Obesity   . Personal history of colon cancer   . Renal insufficiency    Past Surgical History:  Procedure Laterality Date  . COLECTOMY    . LEFT HEART CATHETERIZATION WITH CORONARY ANGIOGRAM N/A 06/09/2013   Procedure: LEFT HEART CATHETERIZATION WITH CORONARY ANGIOGRAM;  Surgeon: Ramond Dial, MD;   Location: [facility] CATH LAB;  Service: Cardiovascular;  Laterality: N/A;   History   Social History  . Marital Status: Single    Spouse Name: N/A  . Number of Children: 0   Occupational History  . retired   Social History Main Topics  . Smoking status:  former smoker     Types: Cigarettes  . Smokeless tobacco: Never Used  . Alcohol Use: No  . Drug Use: No   Current Outpatient Prescriptions on File Prior to Visit  Medication Sig Dispense Refill  . amLODipine (NORVASC) 10 MG tablet Take 0.5 tablets (5 mg total) by mouth daily. 45 tablet 2  . aspirin 81 MG chewable tablet Chew 1 tablet (81 mg total) by mouth daily.    . calcium carbonate (OS-CAL) 600 MG TABS tablet Take 600 mg by mouth 2 (two) times daily with a meal.    . fluticasone (FLONASE) 50 MCG/ACT nasal spray Place 2 sprays into both nostrils daily. 48 g 3  . furosemide (LASIX) 80 MG tablet Take 0.5 tablets (40 mg total) by mouth daily. 45 tablet 1  . glipiZIDE (GLUCOTROL XL) 5 MG 24 hr tablet Take 1 tablet (5 mg total) by mouth daily with breakfast. 90 tablet 2  . hydrOXYzine (ATARAX/VISTARIL) 25 MG tablet Take 1-2 tablets (25-50 mg total) by mouth every 8 (eight) hours as needed for itching. 60 tablet 0  . loratadine (CLARITIN) 10 MG tablet Take 1 tablet (10 mg total) by mouth daily. 100 tablet 3  . losartan (COZAAR) 50 MG tablet Take 1 tablet (50 mg total) by mouth daily. 90 tablet 3  . nebivolol (BYSTOLIC) 10 MG tablet Take 1 tablet (10 mg total) by mouth daily. 90 tablet 3  . nitroGLYCERIN (NITROSTAT) 0.4 MG SL tablet DISSOLVE 1 TABLET UNDER THE TONGUE EVERY 5 MINUTES AS  NEEDED FOR CHEST PAIN 90 tablet 0  . ONE TOUCH ULTRA TEST test strip USE TO TEST BLOOD SUGAR TWO TIMES A DAY AS DIRECTED 200 each 3  . potassium chloride SA (K-DUR,KLOR-CON) 20 MEQ tablet Take 1 tablet (20 mEq total) by mouth daily. 90 tablet 3  . ranitidine (ZANTAC) 150 MG tablet Take 1 tablet (150 mg total) by mouth 2 (two) times daily. 60 tablet 11  .  rosuvastatin (CRESTOR) 5 MG tablet TAKE 1 TABLET BY MOUTH  DAILY AT 6PM 90 tablet 2  . traMADol-acetaminophen (ULTRACET) 37.5-325 MG tablet Take 0.5-1 tablets by mouth every 8 (eight) hours as needed for severe pain. 100 tablet 2  . triamcinolone cream (KENALOG) 0.1 % Apply 1 application topically 4 (four) times daily. 450 g 3  . vitamin B-12 (CYANOCOBALAMIN) 1000 MCG tablet Take 1,000 mcg by mouth daily.     No current facility-administered medications on file prior to visit.    Allergies  Allergen Reactions  . Tradjenta [Linagliptin] Anaphylaxis    CP  . Atorvastatin     REACTION: aches and pains  . Enalapril Maleate  REACTION: cough  . Farxiga [Dapagliflozin] Itching  . Hydrochlorothiazide     REACTION: hair loss  . Kenalog [Triamcinolone Acetonide]     HANDS NUMB   . Metformin And Related     Diarrhea, dizziness  . Propoxyphene N-Acetaminophen Hives  . Simvastatin     REACTION: cramps  . Spironolactone     REACTION: cramps   Family History  Problem Relation Age of Onset  . Hypertension Unknown    PE: BP (!) 146/82   Pulse 93   Ht 5\' 2"  (1.575 m)   Wt 203 lb (92.1 kg)   SpO2 93%   BMI 37.13 kg/m  Body mass index is 37.13 kg/m. Wt Readings from Last 3 Encounters:  04/15/17 203 lb (92.1 kg)  03/17/17 203 lb (92.1 kg)  02/19/17 203 lb 3.2 oz (92.2 kg)   Constitutional: overweight, in NAD Eyes: PERRLA, EOMI, no exophthalmos ENT: moist mucous membranes, + symmetric thyromegaly, no cervical lymphadenopathy Cardiovascular: tachycardia, RR, + 1/6 SEM, + B LE edema L>R Respiratory: CTA B Gastrointestinal: abdomen soft, NT, ND, BS+ Musculoskeletal: no deformities, strength intact in all 4 Skin: moist, warm, no rashes Neurological: no tremor with outstretched hands, DTR normal in all 4  ASSESSMENT: 1. DM2, insulin-independent, controlled, with complications - CAD, s/p AMI 06/2013 - Dr Acie Fredrickson - CHF - CKD - PN - DR - Dr. Jalene Mullet ([facility]) - on IO  injections >> improving  2. Goiter - No neck compression symptoms  - 10/19/2014: thyroid ultrasound: Right thyroid lobe: 4.0 x 1.5 x 1.6 cm. Heterogeneous parenchyma with multiple small cysts identified. The largest measures approximately 0.5 x 0.3 x 0.5 cm. All of the small cysts in the right lobe appears simple and benign.  Left thyroid lobe: 4.5 x 1.4 x 2.0 cm. Heterogeneous parenchyma with multiple cysts. The largest measures approximately 1.0 x 0.4 x 0.5 cm. 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 Similar smaller cyst measures 0.8 cm in greatest diameter. There is a small solid nodule measuring 0.7 x 0.4 x 0.6 cm.  Isthmus Thickness: 0.4 cm. No nodules visualized.  Lymphadenopathy: None visualized.          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 No intervention needed.  3. CKD  PLAN:  1. Patient with long-standing, now better controlled DM in last few mo although the sugars are slightly higher after we stopped Metformin 3 mo ago. We can continue current regimen since no signif. CBG fluctuations. - I suggested to:  Patient Instructions  Please continue: - Glipizide ER 5 mg in am  Please stop at the lab.  Please return in 3-4 months with your sugar log.   - today, HbA1c is 8.6% (not correlating with her sugars at home >> will check a fructosamine) - continue checking sugars at different times of the day - check 1x a day, rotating checks - advised for yearly eye exams >> she is UTD - refuses flu shot today - Return to clinic in 3-4 mo with sugar log    2. Goiter - denies neck compression sxs - small thyroid cysts per last U/S 10/2014  - no further f/u necessary unless she develops neck compression sxs   3. 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 >> will need to start seeing nephrology. [name] now followed closely by PCP. Irbesartan changed to Losartan.  Component     Latest Ref Rng & Units 04/15/2017  Hemoglobin A1C      8.6  Fructosamine  190 - 270  umol/L 334 (H)   HbA1c calculated from the fructosamine is lower than measured, at 7.3%. We can continue current regimen for now.   Philemon Kingdom, MD PhD [facility]

## 2017-04-15 NOTE — Patient Instructions (Addendum)
Please continue: - Glipizide ER 5 mg in am  Please stop at the lab.  Please return in 3-4 months with your sugar log.

## 2017-04-16 LAB — FRUCTOSAMINE: Fructosamine: 334 umol/L — ABNORMAL HIGH (ref 190–270)

## 2017-06-21 DIAGNOSIS — E113512 Type 2 diabetes mellitus with proliferative diabetic retinopathy with macular edema, left eye: Secondary | ICD-10-CM | POA: Diagnosis not present

## 2017-06-21 DIAGNOSIS — E113511 Type 2 diabetes mellitus with proliferative diabetic retinopathy with macular edema, right eye: Secondary | ICD-10-CM | POA: Diagnosis not present

## 2017-06-21 DIAGNOSIS — Z961 Presence of intraocular lens: Secondary | ICD-10-CM | POA: Diagnosis not present

## 2017-06-25 ENCOUNTER — Ambulatory Visit (INDEPENDENT_AMBULATORY_CARE_PROVIDER_SITE_OTHER): Payer: Medicare Other | Admitting: Internal Medicine

## 2017-06-25 ENCOUNTER — Encounter: Payer: Self-pay | Admitting: Internal Medicine

## 2017-06-25 ENCOUNTER — Other Ambulatory Visit (INDEPENDENT_AMBULATORY_CARE_PROVIDER_SITE_OTHER): Payer: Medicare Other

## 2017-06-25 DIAGNOSIS — M5440 Lumbago with sciatica, unspecified side: Secondary | ICD-10-CM

## 2017-06-25 DIAGNOSIS — I2510 Atherosclerotic heart disease of native coronary artery without angina pectoris: Secondary | ICD-10-CM

## 2017-06-25 DIAGNOSIS — E1122 Type 2 diabetes mellitus with diabetic chronic kidney disease: Secondary | ICD-10-CM

## 2017-06-25 DIAGNOSIS — E785 Hyperlipidemia, unspecified: Secondary | ICD-10-CM | POA: Diagnosis not present

## 2017-06-25 DIAGNOSIS — I2583 Coronary atherosclerosis due to lipid rich plaque: Secondary | ICD-10-CM

## 2017-06-25 DIAGNOSIS — D500 Iron deficiency anemia secondary to blood loss (chronic): Secondary | ICD-10-CM

## 2017-06-25 DIAGNOSIS — E1159 Type 2 diabetes mellitus with other circulatory complications: Secondary | ICD-10-CM | POA: Diagnosis not present

## 2017-06-25 DIAGNOSIS — E1165 Type 2 diabetes mellitus with hyperglycemia: Secondary | ICD-10-CM

## 2017-06-25 DIAGNOSIS — M79604 Pain in right leg: Secondary | ICD-10-CM

## 2017-06-25 DIAGNOSIS — N184 Chronic kidney disease, stage 4 (severe): Secondary | ICD-10-CM

## 2017-06-25 DIAGNOSIS — M79605 Pain in left leg: Secondary | ICD-10-CM

## 2017-06-25 DIAGNOSIS — Z85038 Personal history of other malignant neoplasm of large intestine: Secondary | ICD-10-CM | POA: Diagnosis not present

## 2017-06-25 DIAGNOSIS — D509 Iron deficiency anemia, unspecified: Secondary | ICD-10-CM

## 2017-06-25 DIAGNOSIS — I10 Essential (primary) hypertension: Secondary | ICD-10-CM | POA: Diagnosis not present

## 2017-06-25 DIAGNOSIS — E1140 Type 2 diabetes mellitus with diabetic neuropathy, unspecified: Secondary | ICD-10-CM

## 2017-06-25 DIAGNOSIS — IMO0002 Reserved for concepts with insufficient information to code with codable children: Secondary | ICD-10-CM

## 2017-06-25 DIAGNOSIS — I5022 Chronic systolic (congestive) heart failure: Secondary | ICD-10-CM

## 2017-06-25 LAB — HEPATIC FUNCTION PANEL
ALT: 10 U/L (ref 0–35)
AST: 12 U/L (ref 0–37)
Albumin: 4 g/dL (ref 3.5–5.2)
Alkaline Phosphatase: 121 U/L — ABNORMAL HIGH (ref 39–117)
Bilirubin, Direct: 0 mg/dL (ref 0.0–0.3)
Total Bilirubin: 0.3 mg/dL (ref 0.2–1.2)
Total Protein: 7.6 g/dL (ref 6.0–8.3)

## 2017-06-25 LAB — CBC WITH DIFFERENTIAL/PLATELET
Basophils Absolute: 0 10*3/uL (ref 0.0–0.1)
Basophils Relative: 0.5 % (ref 0.0–3.0)
Eosinophils Absolute: 0.1 10*3/uL (ref 0.0–0.7)
Eosinophils Relative: 1.1 % (ref 0.0–5.0)
HCT: 29.7 % — ABNORMAL LOW (ref 36.0–46.0)
Hemoglobin: 9.5 g/dL — ABNORMAL LOW (ref 12.0–15.0)
Lymphocytes Relative: 21.5 % (ref 12.0–46.0)
Lymphs Abs: 1.1 10*3/uL (ref 0.7–4.0)
MCHC: 32.2 g/dL (ref 30.0–36.0)
MCV: 85.7 fl (ref 78.0–100.0)
Monocytes Absolute: 0.4 10*3/uL (ref 0.1–1.0)
Monocytes Relative: 8.8 % (ref 3.0–12.0)
Neutro Abs: 3.4 10*3/uL (ref 1.4–7.7)
Neutrophils Relative %: 68.1 % (ref 43.0–77.0)
Platelets: 148 10*3/uL — ABNORMAL LOW (ref 150.0–400.0)
RBC: 3.47 Mil/uL — ABNORMAL LOW (ref 3.87–5.11)
RDW: 15.4 % (ref 11.5–15.5)
WBC: 5 10*3/uL (ref 4.0–10.5)

## 2017-06-25 LAB — IRON,TIBC AND FERRITIN PANEL
%SAT: 13 % (calc) (ref 11–50)
Ferritin: 18 ng/mL — ABNORMAL LOW (ref 20–288)
Iron: 41 ug/dL — ABNORMAL LOW (ref 45–160)
TIBC: 310 mcg/dL (calc) (ref 250–450)

## 2017-06-25 LAB — TSH: TSH: 1.63 u[IU]/mL (ref 0.35–4.50)

## 2017-06-25 LAB — HEMOGLOBIN A1C: Hgb A1c MFr Bld: 8.6 % — ABNORMAL HIGH (ref 4.6–6.5)

## 2017-06-25 LAB — CK: Total CK: 99 U/L (ref 7–177)

## 2017-06-25 MED ORDER — FERROUS SULFATE 325 (65 FE) MG PO TABS: 325.0000 mg | ORAL_TABLET | Freq: Two times a day (BID) | ORAL | 5 refills | Status: DC

## 2017-06-25 MED ORDER — TRAMADOL-ACETAMINOPHEN 37.5-325 MG PO TABS: 0.5000 | ORAL_TABLET | Freq: Three times a day (TID) | ORAL | 3 refills | Status: DC | PRN

## 2017-06-25 MED ORDER — RANITIDINE HCL 150 MG PO TABS: 150.0000 mg | ORAL_TABLET | Freq: Two times a day (BID) | ORAL | 11 refills | Status: DC

## 2017-06-25 NOTE — Assessment & Plan Note (Signed)
Aspirin, Crestor 

## 2017-06-25 NOTE — Patient Instructions (Signed)
Hold Crestor for 2 weeks

## 2017-06-25 NOTE — Assessment & Plan Note (Signed)
Hold Crestor for 2 weeks

## 2017-06-25 NOTE — Assessment & Plan Note (Signed)
Monitoring labs 

## 2017-06-25 NOTE — Assessment & Plan Note (Signed)
Off Metformin On Glipizide

## 2017-06-25 NOTE — Assessment & Plan Note (Signed)
Norvasc, Valsartan, Coreg

## 2017-06-25 NOTE — Progress Notes (Signed)
Subjective:  Patient ID: [name], female    DOB: 11/18/1950  Age: 66 y.o. MRN: [id number]  CC: No chief complaint on file.   HPI [name] presents for HTN, Colon ca, anemia, CAD f/u Laser eye surgery is planned for Jan 2019 C/o LBP, leg pain CBGs are better  Outpatient Medications Prior to Visit  Medication Sig Dispense Refill  . amLODipine (NORVASC) 10 MG tablet Take 0.5 tablets (5 mg total) by mouth daily. 45 tablet 2  . aspirin 81 MG chewable tablet Chew 1 tablet (81 mg total) by mouth daily.    . calcium carbonate (OS-CAL) 600 MG TABS tablet Take 600 mg by mouth 2 (two) times daily with a meal.    . fluticasone (FLONASE) 50 MCG/ACT nasal spray Place 2 sprays into both nostrils daily. 48 g 3  . furosemide (LASIX) 80 MG tablet Take 0.5 tablets (40 mg total) by mouth daily. 45 tablet 1  . glipiZIDE (GLUCOTROL XL) 5 MG 24 hr tablet Take 1 tablet (5 mg total) by mouth daily with breakfast. 90 tablet 2  . hydrOXYzine (ATARAX/VISTARIL) 25 MG tablet Take 1-2 tablets (25-50 mg total) by mouth every 8 (eight) hours as needed for itching. 60 tablet 0  . loratadine (CLARITIN) 10 MG tablet Take 1 tablet (10 mg total) by mouth daily. 100 tablet 3  . losartan (COZAAR) 50 MG tablet Take 1 tablet (50 mg total) by mouth daily. 90 tablet 3  . nebivolol (BYSTOLIC) 10 MG tablet Take 1 tablet (10 mg total) by mouth daily. 90 tablet 3  . nitroGLYCERIN (NITROSTAT) 0.4 MG SL tablet DISSOLVE 1 TABLET UNDER THE TONGUE EVERY 5 MINUTES AS  NEEDED FOR CHEST PAIN 90 tablet 0  . ONE TOUCH ULTRA TEST test strip USE TO TEST BLOOD SUGAR TWO TIMES A DAY AS DIRECTED 200 each 3  . potassium chloride SA (K-DUR,KLOR-CON) 20 MEQ tablet Take 1 tablet (20 mEq total) by mouth daily. 90 tablet 3  . ranitidine (ZANTAC) 150 MG tablet Take 1 tablet (150 mg total) by mouth 2 (two) times daily. 60 tablet 11  . rosuvastatin (CRESTOR) 5 MG tablet TAKE 1 TABLET BY MOUTH  DAILY AT 6PM 90 tablet 2  .  traMADol-acetaminophen (ULTRACET) 37.5-325 MG tablet Take 0.5-1 tablets by mouth every 8 (eight) hours as needed for severe pain. 100 tablet 2  . triamcinolone cream (KENALOG) 0.1 % Apply 1 application topically 4 (four) times daily. 450 g 3  . vitamin B-12 (CYANOCOBALAMIN) 1000 MCG tablet Take 1,000 mcg by mouth daily.     No facility-administered medications prior to visit.     ROS Review of Systems  Constitutional: Positive for fatigue. Negative for activity change, appetite change, chills and unexpected weight change.  HENT: Negative for congestion, mouth sores and sinus pressure.   Eyes: Positive for discharge and visual disturbance.  Respiratory: Negative for cough and chest tightness.   Gastrointestinal: Negative for abdominal pain and nausea.  Genitourinary: Negative for difficulty urinating, frequency and vaginal pain.  Musculoskeletal: Positive for arthralgias and back pain. Negative for gait problem.  Skin: Negative for pallor and rash.  Neurological: Negative for dizziness, tremors, weakness, numbness and headaches.  Psychiatric/Behavioral: Negative for confusion and sleep disturbance.    Objective:  BP 140/84 (BP Location: Left Arm, Patient Position: Sitting, Cuff Size: Large)   Pulse 65   Temp 98.7 F (37.1 C) (Oral)   Ht 5\' 2"  (1.575 m)   Wt 208 lb 8 oz (94.6 kg)  SpO2 99%   BMI 38.14 kg/m   BP Readings from Last 3 Encounters:  06/25/17 140/84  04/15/17 (!) 146/82  03/17/17 130/74    Wt Readings from Last 3 Encounters:  06/25/17 208 lb 8 oz (94.6 kg)  04/15/17 203 lb (92.1 kg)  03/17/17 203 lb (92.1 kg)    Physical Exam  Constitutional: She appears well-developed. No distress.  HENT:  Head: Normocephalic.  Right Ear: External ear normal.  Left Ear: External ear normal.  Nose: Nose normal.  Mouth/Throat: Oropharynx is clear and moist.  Eyes: Conjunctivae are normal. Pupils are equal, round, and reactive to light. Right eye exhibits no discharge. Left  eye exhibits no discharge.  Neck: Normal range of motion. Neck supple. No JVD present. No tracheal deviation present. No thyromegaly present.  Cardiovascular: Normal rate, regular rhythm and normal heart sounds.  Pulmonary/Chest: No stridor. No respiratory distress. She has no wheezes.  Abdominal: Soft. Bowel sounds are normal. She exhibits no distension and no mass. There is no tenderness. There is no rebound and no guarding.  Musculoskeletal: She exhibits tenderness. She exhibits no edema.  Lymphadenopathy:    She has no cervical adenopathy.  Neurological: She displays normal reflexes. No cranial nerve deficit. She exhibits normal muscle tone. Coordination normal.  Skin: No rash noted. No erythema.  Psychiatric: She has a normal mood and affect. Her behavior is normal. Judgment and thought content normal.  LS spine, knees - tender w/ROM Obese  Lab Results  Component Value Date   WBC 6.1 11/13/2016   HGB 10.0 (L) 11/13/2016   HCT 30.3 (L) 11/13/2016   PLT 144.0 (L) 11/13/2016   GLUCOSE 205 (H) 03/12/2017   CHOL 121 05/18/2016   TRIG 48 05/18/2016   HDL 60 05/18/2016   LDLCALC 51 05/18/2016   ALT 12 11/13/2016   AST 11 11/13/2016   NA 137 03/12/2017   K 4.9 03/12/2017   CL 104 03/12/2017   CREATININE 2.26 (H) 03/12/2017   BUN 40 (H) 03/12/2017   CO2 17 (L) 03/12/2017   TSH 2.19 10/03/2013   INR 1.16 06/07/2013   HGBA1C 8.6 04/15/2017   MICROALBUR 13.9 (H) 10/13/2007    No results found.  Assessment & Plan:   There are no diagnoses linked to this encounter. I am having [name] maintain her vitamin B-12, aspirin, calcium carbonate, traMADol-acetaminophen, fluticasone, ONE TOUCH ULTRA TEST, ranitidine, triamcinolone cream, hydrOXYzine, loratadine, rosuvastatin, amLODipine, furosemide, glipiZIDE, potassium chloride SA, nebivolol, losartan, and nitroGLYCERIN.  No orders of the defined types were placed in this encounter.    Follow-up: No Follow-up on  file.  Walker Kehr, MD

## 2017-06-25 NOTE — Assessment & Plan Note (Signed)
Labs PO iron

## 2017-06-25 NOTE — Assessment & Plan Note (Signed)
Due to Crestor vs other Hold Crestor for 2 weeks

## 2017-06-25 NOTE — Assessment & Plan Note (Signed)
CEA q 12 mo F/u w/GI

## 2017-06-28 LAB — CEA: CEA: 3 ng/mL — ABNORMAL HIGH

## 2017-07-06 ENCOUNTER — Other Ambulatory Visit: Payer: Self-pay | Admitting: Internal Medicine

## 2017-07-06 DIAGNOSIS — D500 Iron deficiency anemia secondary to blood loss (chronic): Secondary | ICD-10-CM

## 2017-07-06 DIAGNOSIS — Z85038 Personal history of other malignant neoplasm of large intestine: Secondary | ICD-10-CM

## 2017-07-08 ENCOUNTER — Encounter: Payer: Self-pay | Admitting: Internal Medicine

## 2017-07-09 ENCOUNTER — Other Ambulatory Visit: Payer: Self-pay | Admitting: Internal Medicine

## 2017-07-21 ENCOUNTER — Other Ambulatory Visit: Payer: Self-pay | Admitting: Internal Medicine

## 2017-07-21 DIAGNOSIS — Z85038 Personal history of other malignant neoplasm of large intestine: Secondary | ICD-10-CM

## 2017-07-21 DIAGNOSIS — E113511 Type 2 diabetes mellitus with proliferative diabetic retinopathy with macular edema, right eye: Secondary | ICD-10-CM | POA: Diagnosis not present

## 2017-07-21 MED ORDER — PIOGLITAZONE HCL 30 MG PO TABS: 30.0000 mg | ORAL_TABLET | Freq: Every day | ORAL | 11 refills | Status: DC

## 2017-07-23 ENCOUNTER — Telehealth: Payer: Self-pay | Admitting: Internal Medicine

## 2017-07-23 ENCOUNTER — Ambulatory Visit: Payer: Self-pay | Admitting: *Deleted

## 2017-07-23 NOTE — Telephone Encounter (Signed)
Ok to wait till she sees Dr Cruzita Lederer Thx

## 2017-07-23 NOTE — Telephone Encounter (Signed)
Pt notified and voiced understanding 

## 2017-07-23 NOTE — Telephone Encounter (Signed)
  Reason for Disposition . Caller has already spoken with another triager and has no further questions.  Answer Assessment - Initial Assessment Questions 1. REASON FOR CALL or QUESTION: "What is your reason for calling today?" or "How can I best help you?" or "What question do you have that I can help answer?"     Question about medication  Protocols used: NO CONTACT OR DUPLICATE CONTACT CALL-A-AH, INFORMATION ONLY CALL-A-AH

## 2017-07-23 NOTE — Telephone Encounter (Signed)
Please advise 

## 2017-07-23 NOTE — Telephone Encounter (Signed)
Mrs. Prescher phoned with a concern regarding a recent medication she was prescribed, Actos. Her question is: Should she be taking Actos since she has congestive heart failure. There is a [place] Boxed Warning Alert regarding taking Actos along with a diagnosis of Class 3 or Class 4 CHF. Please advise.

## 2017-08-11 ENCOUNTER — Encounter: Payer: Self-pay | Admitting: Internal Medicine

## 2017-08-11 ENCOUNTER — Ambulatory Visit (INDEPENDENT_AMBULATORY_CARE_PROVIDER_SITE_OTHER): Payer: Medicare Other | Admitting: Internal Medicine

## 2017-08-11 VITALS — BP 136/76 | HR 82 | Ht 62.0 in | Wt 209.8 lb

## 2017-08-11 DIAGNOSIS — E088 Diabetes mellitus due to underlying condition with unspecified complications: Secondary | ICD-10-CM

## 2017-08-11 DIAGNOSIS — D509 Iron deficiency anemia, unspecified: Secondary | ICD-10-CM | POA: Diagnosis not present

## 2017-08-11 DIAGNOSIS — K219 Gastro-esophageal reflux disease without esophagitis: Secondary | ICD-10-CM | POA: Diagnosis not present

## 2017-08-11 MED ORDER — PEG-KCL-NACL-NASULF-NA ASC-C 140 G PO SOLR: 1.0000 | Freq: Once | ORAL | 0 refills | Status: AC

## 2017-08-11 NOTE — Progress Notes (Addendum)
HISTORY OF PRESENT ILLNESS:  [name] is a 67 y.o. female with multiple medical problems including COPD, diabetes mellitus, hypertension, hyperlipidemia, coronary artery disease with prior myocardial infarction and coronary artery stent placement, and morbid obesity. QUESTION a history of colon cancer with colectomy (we could not find any records, pathology, operative report, colonoscopy report to the contrary). She is sent today by her primary care provider Dr. Alain [name] for evaluation of iron deficiency anemia. Recent evaluation found the patient had a hemoglobin of 9.5. Platelets 148,000. Ferritin 18. Iron saturation 13%. Also an elevated CEA level of 3.0. Patient states that she had colonoscopy remotely. We cannot find those records. She was contacted in 2011 to have colonoscopy but declined. She acknowledges this. She is accompanied today by her cousin. Patient tells me that she has had reflux symptoms off and on for some time for which she takes ranitidine 150 mg twice a day. She denies dysphagia or abdominal pain. Her weight fluctuates but has been stable. No melena or hematochezia. She denies being a blood donor. She was started on oral iron therapy in January. No family history of colon cancer. She is diabetic on oral agents. She has a history of coronary artery disease with prior stent placement. No Plavix. On aspirin.  REVIEW OF SYSTEMS:  All non-GI ROS negative unless otherwise stated in the history of present illness except for visual change, nosebleeds, muscle pains, ankle swelling  Past Medical History:  Diagnosis Date  . Anemia   . COPD (chronic obstructive pulmonary disease) ([place])   . Diabetes mellitus   . Hyperlipidemia   . Hypertension   . Ischemic cardiomyopathy 06/11/2013  . Low back pain   . NSTEMI (non-ST elevated myocardial infarction) ([place]) 06/11/2013  . Obesity   . Personal history of colon cancer   . Renal insufficiency     Past Surgical History:  Procedure  Laterality Date  . COLECTOMY    . LEFT HEART CATHETERIZATION WITH CORONARY ANGIOGRAM N/A 06/09/2013   Procedure: LEFT HEART CATHETERIZATION WITH CORONARY ANGIOGRAM;  Surgeon: Ramond Dial, MD;  Location: [facility] CATH LAB;  Service: Cardiovascular;  Laterality: N/A;    Social History [name]  reports that she has been smoking cigarettes.  she has never used smokeless tobacco. She reports that she does not drink alcohol or use drugs.  family history includes Hypertension in her unknown relative.  Allergies  Allergen Reactions  . Tradjenta [Linagliptin] Anaphylaxis    CP  . Atorvastatin     REACTION: aches and pains  . Enalapril Maleate     REACTION: cough  . Farxiga [Dapagliflozin] Itching  . Hydrochlorothiazide     REACTION: hair loss  . Kenalog [Triamcinolone Acetonide]     HANDS NUMB   . Metformin And Related     Diarrhea, dizziness  . Propoxyphene N-Acetaminophen Hives  . Simvastatin     REACTION: cramps  . Spironolactone     REACTION: cramps       PHYSICAL EXAMINATION: Vital signs: BP 136/76   Pulse 82   Ht 5\' 2"  (1.575 m)   Wt 209 lb 12.8 oz (95.2 kg)   SpO2 96%   BMI 38.37 kg/m   Constitutional: Pleasant, obese, generally well-appearing, no acute distress Psychiatric: alert and oriented x3, cooperative Eyes: extraocular movements intact, anicteric, conjunctiva pink Mouth: oral without thyromegaly Lymph: pharynx moist, no lesions Neck: supple no lymphadenopathy Cardiovascular: heart regular rate and rhythm, no murmur Lungs: clear to auscultation bilaterally Abdomen: soft, obese,  nontender, nondistended, no obvious ascites, no peritoneal signs, normal bowel sounds, no organomegaly Rectal: Deferred until upcoming colonoscopy Extremities: no clubbing, cyanosis, or lower extremity edema bilaterally Skin: no lesions on visible extremities Neuro: No focal deficits. Normal DTRs.  ASSESSMENT:  #1. Iron deficiency anemia. Rule out underlying GI  mucosal lesion. I am concerned for the possibility of occult colon cancer given her iron deficiency anemia and elevated CEA level. #2. Chronic GERD. Currently managed with H2 receptor antagonist therapy #3. Multiple medical problems including coronary artery disease with prior stent placement and diabetes mellitus on oral agents #4. Morbid obesity #5. Personal history of colon cancer. See below addendum   PLAN:  #1. Colonoscopy to evaluate iron deficiency anemia and elevated CEA. The patient is at high risk given her body habitus and comorbidities.The nature of the procedure, as well as the risks, benefits, and alternatives were carefully and thoroughly reviewed with the patient. Ample time for discussion and questions allowed. The patient understood, was satisfied, and agreed to proceed. #2. Upper endoscopy to evaluate iron deficiency anemia and chronic GERD. She is at high risk given her body habitus and comorbidities.The nature of the procedure, as well as the risks, benefits, and alternatives were carefully and thoroughly reviewed with the patient. Ample time for discussion and questions allowed. The patient understood, was satisfied, and agreed to proceed. #3. Hold oral diabetic agents today of the procedure to avoid and wanted hypoglycemia #4. Told to hold iron approximately 1 week prior to the procedure to assist with bowel preparation  A copy of this consultation note has been sent to Dr. Alain [name]  ADDENDUM: 08/27/2017. Old chart from warehouse obtained and reviewed. Highlights as outlined. 1. Colonoscopy at [facility] 08/11/1996 to evaluate iron deficiency anemia. 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 2. Status post right hemicolectomy for right colon cancer by Dr. Armandina Gemma 08/13/1996. PATHOLOGY [facility] 3. Surveillance colonoscopies 2000, 2001. Diverticulosis. 4. Seen in office 05/05/2002. Overdue for surveillance colonoscopy. Scheduled for 06/06/2002. Not clear that  patient follow through (no record of procedure). Not clear that she's been seen by GI since until today

## 2017-08-11 NOTE — Patient Instructions (Signed)

## 2017-08-17 ENCOUNTER — Ambulatory Visit (INDEPENDENT_AMBULATORY_CARE_PROVIDER_SITE_OTHER): Payer: Medicare Other | Admitting: Internal Medicine

## 2017-08-17 ENCOUNTER — Encounter: Payer: Self-pay | Admitting: Internal Medicine

## 2017-08-17 VITALS — BP 142/78 | HR 78 | Ht 62.0 in | Wt 209.0 lb

## 2017-08-17 DIAGNOSIS — E1159 Type 2 diabetes mellitus with other circulatory complications: Secondary | ICD-10-CM | POA: Diagnosis not present

## 2017-08-17 DIAGNOSIS — E049 Nontoxic goiter, unspecified: Secondary | ICD-10-CM

## 2017-08-17 LAB — LIPID PANEL
Cholesterol: 119 mg/dL (ref 0–200)
HDL: 48.9 mg/dL (ref >39.00)
LDL Cholesterol: 57 mg/dL (ref 0–99)
NonHDL: 70.29
Total CHOL/HDL Ratio: 2
Triglycerides: 64 mg/dL (ref 0.0–149.0)
VLDL: 12.8 mg/dL (ref 0.0–40.0)

## 2017-08-17 NOTE — Progress Notes (Signed)
Patient ID: [name], female   DOB: 1950/09/19, 67 y.o.   MRN: [id number]  HPI: [name] is a 67 y.o.-year-old female, returning for f/u DM2, dx in 2006, insulin-independent, controlled, with complications (CAD - s/p AMI 06/2013, CHF; CKD; PN; DR). Last visit 4 months ago.  She was suggested to start Actos by PCP based on the HbA1c obtained in 06/2017. She did not start Actos for fear of SEs (she has CHF).  Last hemoglobin A1c was: 04/15/2017: HbA1c calculated from the fructosamine is lower than measured, at 7.3% Lab Results  Component Value Date   HGBA1C 8.6 (H) 06/25/2017   HGBA1C 8.6 04/15/2017   HGBA1C 8.5 (H) 11/13/2016  08/21/2016: HbA1c 6.0%.  She has refused insulin repeatedly in the past. She started to change her diet after her hemoglobin A1c returned high, at 16.2% in 09/2014. She cut down portions, changed the meal content to include more fiber and less carbs.  HbA1c decreased dramatically.  Pt is on: - Glipizide ER 5 mg in am - started 03/2015 She stopped metformin ER in 01/2017. She tried Tradjenta 5 mg daily in am >> CP with it (started 10/2014) >> had to stop - she did see her cardiologist since then She tried [place] >> decreased [name] and yeast inf. She tried regular metformin >> diarrhea  Pt checks her sugars 2x a day: - am: 94-113 >> 84-118 >> 90-120, 128 >> 103-130 >> 95, 105-130 - 2h after b'fast: 94-122 >> 120-134, 152 >> 115-140 >> 115-140, 156 - before lunch:94-131 >> 114 >> 95, 104 >> 141 >> 136 >> 145-156 - 2h after lunch:117, 130 >> 97-128 >> 132-140 >> n/c - before dinner:78-128, 140 >> 80-121, 128 >> 118-157 >> 71, 85-144, 157 - 2h after dinner:98-128 >> 98-132, 142 >> 125-152 >> 128-148 - bedtime:  99, 114 >> 109-142 >> n/c >> 137 - nighttime: n/c Lowest sugar was 80 >> 103 >> 71;   Unclear at what level she has hypoglycemia awareness. Highest sugar was  152 >> 160 >> 156.  Glucometer: One Touch Ultra  Pt's meals are: - Breakfast:  oatmeal, cereals, [place] bacon, egg toast - Lunch: sandwich or fruit salad - Dinner: salmon/chicken, Brussel sprouts, other veggies, sweet potatoes or brown rice, spaghetti - Snacks: 2: carrots with dip; yoghurt with fruit, veggies + dip She walks for exercise.  - + CKD, last BUN/creatinine -not seeing nephrology. Lab Results  Component Value Date   BUN 40 (H) 03/12/2017   CREATININE 2.26 (H) 03/12/2017  Previously on irbesartan, now losartan -+ HL; last set of lipids: Lab Results  Component Value Date   CHOL 121 05/18/2016   HDL 60 05/18/2016   LDLCALC 51 05/18/2016   TRIG 48 05/18/2016   CHOLHDL 2.0 05/18/2016  On Crestor 5. - last eye exam was in 04/2017: + DR. + cataracts.  Had laser surgery. -+ Numbness and tingling in her feet. Has PN.   She also has a history of HTN, GERD, and anemia.  Patient also has a history of goiter that contains small cysts.  Pt denies: - feeling nodules in neck - hoarseness - dysphagia - choking - SOB with lying down  Latest TSH normal: Lab Results  Component Value Date   TSH 1.63 06/25/2017    ROS: Constitutional: no weight gain/no weight loss, no fatigue, no subjective hyperthermia, no subjective hypothermia Eyes: no blurry vision, no xerophthalmia ENT: no sore throat, + see HPI Cardiovascular: no CP/no SOB/no palpitations/no leg swelling Respiratory: +  cough/no SOB/no wheezing Gastrointestinal: no N/no V/no D/no C/+ acid reflux Musculoskeletal: + muscle aches/+ joint aches Skin: no rashes, no hair loss Neurological: no tremors/+ numbness/+ tingling/no dizziness  I reviewed pt's medications, allergies, PMH, social hx, family hx, and changes were documented in the history of present illness. Otherwise, unchanged from my initial visit note.  Past Medical History:  Diagnosis Date  . Anemia   . COPD (chronic obstructive pulmonary disease) ([place])   . Diabetes mellitus   . Hyperlipidemia   . Hypertension   . Ischemic  cardiomyopathy 06/11/2013  . Low back pain   . NSTEMI (non-ST elevated myocardial infarction) ([place]) 06/11/2013  . Obesity   . Personal history of colon cancer   . Renal insufficiency    Past Surgical History:  Procedure Laterality Date  . COLECTOMY    . LEFT HEART CATHETERIZATION WITH CORONARY ANGIOGRAM N/A 06/09/2013   Procedure: LEFT HEART CATHETERIZATION WITH CORONARY ANGIOGRAM;  Surgeon: Ramond Dial, MD;  Location: [facility] CATH LAB;  Service: Cardiovascular;  Laterality: N/A;   History   Social History  . Marital Status: Single    Spouse Name: N/A  . Number of Children: 0   Occupational History  . retired   Social History Main Topics  . Smoking status:  former smoker     Types: Cigarettes  . Smokeless tobacco: Never Used  . Alcohol Use: No  . Drug Use: No   Current Outpatient Medications on File Prior to Visit  Medication Sig Dispense Refill  . amLODipine (NORVASC) 10 MG tablet Take 0.5 tablets (5 mg total) by mouth daily. 45 tablet 2  . aspirin 81 MG chewable tablet Chew 1 tablet (81 mg total) by mouth daily.    . calcium carbonate (OS-CAL) 600 MG TABS tablet Take 600 mg by mouth 2 (two) times daily with a meal.    . ferrous sulfate 325 (65 FE) MG tablet Take 1 tablet (325 mg total) by mouth 2 (two) times daily with a meal. 60 tablet 5  . fluticasone (FLONASE) 50 MCG/ACT nasal spray Place 2 sprays into both nostrils daily. 48 g 3  . furosemide (LASIX) 80 MG tablet Take 0.5 tablets (40 mg total) by mouth daily. 45 tablet 1  . glipiZIDE (GLUCOTROL XL) 5 MG 24 hr tablet Take 1 tablet (5 mg total) by mouth daily with breakfast. 90 tablet 2  . hydrOXYzine (ATARAX/VISTARIL) 25 MG tablet Take 1-2 tablets (25-50 mg total) by mouth every 8 (eight) hours as needed for itching. 60 tablet 0  . loratadine (CLARITIN) 10 MG tablet Take 1 tablet (10 mg total) by mouth daily. 100 tablet 3  . losartan (COZAAR) 50 MG tablet Take 1 tablet (50 mg total) by mouth daily. 90 tablet 3  .  nebivolol (BYSTOLIC) 10 MG tablet Take 1 tablet (10 mg total) by mouth daily. 90 tablet 3  . nitroGLYCERIN (NITROSTAT) 0.4 MG SL tablet DISSOLVE 1 TABLET UNDER THE TONGUE EVERY 5 MINUTES AS  NEEDED FOR CHEST PAIN 90 tablet 0  . ONE TOUCH ULTRA TEST test strip USE TO TEST BLOOD SUGAR TWO TIMES A DAY AS DIRECTED 100 each 11  . pioglitazone (ACTOS) 30 MG tablet Take 1 tablet (30 mg total) by mouth daily. (Patient not taking: Reported on 08/11/2017) 30 tablet 11  . potassium chloride SA (K-DUR,KLOR-CON) 20 MEQ tablet Take 1 tablet (20 mEq total) by mouth daily. 90 tablet 3  . ranitidine (ZANTAC) 150 MG tablet Take 1 tablet (150 mg total) by  mouth 2 (two) times daily. 60 tablet 11  . rosuvastatin (CRESTOR) 5 MG tablet TAKE 1 TABLET BY MOUTH  DAILY AT 6PM 90 tablet 2  . traMADol-acetaminophen (ULTRACET) 37.5-325 MG tablet Take 0.5-1 tablets by mouth every 8 (eight) hours as needed for severe pain. 100 tablet 3  . triamcinolone cream (KENALOG) 0.1 % Apply 1 application topically 4 (four) times daily. 450 g 3  . vitamin B-12 (CYANOCOBALAMIN) 1000 MCG tablet Take 1,000 mcg by mouth daily.     No current facility-administered medications on file prior to visit.    Allergies  Allergen Reactions  . Tradjenta [Linagliptin] Anaphylaxis    CP  . Atorvastatin     REACTION: aches and pains  . Enalapril Maleate     REACTION: cough  . Farxiga [Dapagliflozin] Itching  . Hydrochlorothiazide     REACTION: hair loss  . Kenalog [Triamcinolone Acetonide]     HANDS NUMB   . Metformin And Related     Diarrhea, dizziness  . Propoxyphene N-Acetaminophen Hives  . Simvastatin     REACTION: cramps  . Spironolactone     REACTION: cramps   Family History  Problem Relation Age of Onset  . Hypertension Unknown    PE: BP (!) 142/78 (BP Location: Right Arm, Patient Position: Sitting, Cuff Size: Large)   Pulse 78   Ht 5\' 2"  (1.575 m)   Wt 209 lb (94.8 kg)   SpO2 98%   BMI 38.23 kg/m  Body mass index is 38.23  kg/m. Wt Readings from Last 3 Encounters:  08/17/17 209 lb (94.8 kg)  08/11/17 209 lb 12.8 oz (95.2 kg)  06/25/17 208 lb 8 oz (94.6 kg)   Constitutional: overweight, in NAD Eyes: PERRLA, EOMI, no exophthalmos ENT: moist mucous membranes, + symmetric thyromegaly, no cervical lymphadenopathy Cardiovascular: RRR, No RG,+1/6 SEM, + BLE edema L>R Respiratory: CTA B Gastrointestinal: abdomen soft, NT, ND, BS+ Musculoskeletal: no deformities, strength intact in all 4 Skin: moist, warm, no rashes Neurological: no tremor with outstretched hands, DTR normal in all 4  ASSESSMENT: 1. DM2, insulin-independent, controlled, with complications - CAD, s/p AMI 06/2013 - Dr Acie Fredrickson - CHF - CKD - PN - DR - Dr. Jalene Mullet ([facility]) - on IO injections >> improving  2. Goiter - No neck compression symptoms  - 10/19/2014: thyroid ultrasound: Right thyroid lobe: 4.0 x 1.5 x 1.6 cm. Heterogeneous parenchyma with multiple small cysts identified. The largest measures approximately 0.5 x 0.3 x 0.5 cm. All of the small cysts in the right lobe appears simple and benign.  Left thyroid lobe: 4.5 x 1.4 x 2.0 cm. Heterogeneous parenchyma with multiple cysts. The largest measures approximately 1.0 x 0.4 x 0.5 cm. 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 Similar smaller cyst measures 0.8 cm in greatest diameter. There is a small solid nodule measuring 0.7 x 0.4 x 0.6 cm.  Isthmus Thickness: 0.4 cm. No nodules visualized.  Lymphadenopathy: None visualized.          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 No intervention needed.   PLAN:  1. Patient with long-standing, now better controlled diabetes, with slightly higher sugars at last visit after we stopped metformin.  Since she had no significant CBG fluctuations, we continued only glipizide at last visit. - at last visit, we checked her HbA1c and this was high, at 8.6%, but the HbA1c calculated from the fructosamine was  better and correlating with the sugars in her log: 7.3%. - she had a recent  HbA1c which again returned high, at 8.6%, but sugars at home are again at or close to goal.  - we discussed about Actos and I agree with her that this is not needed at this point >> will continue only on Glipizide - will check another fructosamine level now - I suggested to:  Patient Instructions  Please continue: - Glipizide ER 5 mg in am  Please stop at the lab.  Please return in 4 months with your sugar log.   - continue checking sugars at different times of the day - check 1x a day, rotating checks - advised for yearly eye exams >> she is UTD  - refused a flu shot  - Return to clinic in 4 mo with sugar log    2. Goiter -No neck compression symptoms -Only small thyroid cysts per last ultrasound from 10/2016-report reviewed -Latest TSH normal 06/2017 -per review of the chart -No further follow-up necessary unless she develops neck compression symptoms  Component     Latest Ref Rng & Units 08/17/2017  Cholesterol     0 - 200 mg/dL 119  Triglycerides     0.0 - 149.0 mg/dL 64.0  HDL Cholesterol     >39.00 mg/dL 48.90  VLDL     0.0 - 40.0 mg/dL 12.8  LDL (calc)     0 - 99 mg/dL 57  Total CHOL/HDL Ratio      2  NonHDL      70.29  Fructosamine     190 - 270 umol/L 331 (H)   Lipids at goal. HbA1c calculated from fructosamine: 7.2%, slightly improved.  Philemon Kingdom, MD PhD [facility] Endocrinology

## 2017-08-17 NOTE — Patient Instructions (Signed)
Please continue: - Glipizide ER 5 mg in am  Please stop at the lab.  Please return in 4 months with your sugar log.

## 2017-08-19 LAB — FRUCTOSAMINE: Fructosamine: 331 umol/L — ABNORMAL HIGH (ref 190–270)

## 2017-08-27 ENCOUNTER — Other Ambulatory Visit: Payer: Self-pay | Admitting: Internal Medicine

## 2017-08-31 ENCOUNTER — Telehealth: Payer: Self-pay | Admitting: Internal Medicine

## 2017-09-06 NOTE — Telephone Encounter (Signed)
Spoke with patient and told her I would leave a Plenvu sample up front for her to pick up.  Patient agreed.

## 2017-09-20 DIAGNOSIS — E113511 Type 2 diabetes mellitus with proliferative diabetic retinopathy with macular edema, right eye: Secondary | ICD-10-CM | POA: Diagnosis not present

## 2017-09-20 DIAGNOSIS — H31093 Other chorioretinal scars, bilateral: Secondary | ICD-10-CM | POA: Diagnosis not present

## 2017-09-20 DIAGNOSIS — E113512 Type 2 diabetes mellitus with proliferative diabetic retinopathy with macular edema, left eye: Secondary | ICD-10-CM | POA: Diagnosis not present

## 2017-09-20 DIAGNOSIS — Z961 Presence of intraocular lens: Secondary | ICD-10-CM | POA: Diagnosis not present

## 2017-09-28 ENCOUNTER — Encounter: Payer: Self-pay | Admitting: Cardiovascular Disease

## 2017-09-29 ENCOUNTER — Encounter: Payer: Self-pay | Admitting: Internal Medicine

## 2017-09-29 ENCOUNTER — Other Ambulatory Visit: Payer: Self-pay

## 2017-09-29 ENCOUNTER — Ambulatory Visit (AMBULATORY_SURGERY_CENTER): Payer: Medicare Other | Admitting: Internal Medicine

## 2017-09-29 VITALS — BP 164/68 | HR 66 | Temp 98.2°F | Resp 21 | Ht 62.0 in | Wt 209.0 lb

## 2017-09-29 DIAGNOSIS — D123 Benign neoplasm of transverse colon: Secondary | ICD-10-CM

## 2017-09-29 DIAGNOSIS — Z85038 Personal history of other malignant neoplasm of large intestine: Secondary | ICD-10-CM

## 2017-09-29 DIAGNOSIS — K219 Gastro-esophageal reflux disease without esophagitis: Secondary | ICD-10-CM

## 2017-09-29 DIAGNOSIS — D509 Iron deficiency anemia, unspecified: Secondary | ICD-10-CM

## 2017-09-29 DIAGNOSIS — K635 Polyp of colon: Secondary | ICD-10-CM

## 2017-09-29 DIAGNOSIS — J449 Chronic obstructive pulmonary disease, unspecified: Secondary | ICD-10-CM | POA: Diagnosis not present

## 2017-09-29 DIAGNOSIS — D122 Benign neoplasm of ascending colon: Secondary | ICD-10-CM | POA: Diagnosis not present

## 2017-09-29 MED ORDER — SODIUM CHLORIDE 0.9 % IV SOLN: 500.0000 mL | Freq: Once | INTRAVENOUS | Status: DC

## 2017-09-29 NOTE — Progress Notes (Signed)
Called to room to assist during endoscopic procedure.  Patient ID and intended procedure confirmed with present staff. Received instructions for my participation in the procedure from the performing physician.  

## 2017-09-29 NOTE — Patient Instructions (Signed)
Information on polyps, diverticulosis, and GERD.  Continue iron 325 mg twice per day.  Have blood counts and iron levels checked by Dr. Alain Marion.  YOU HAD AN ENDOSCOPIC PROCEDURE TODAY AT [facility]:   Refer to the procedure report that was given to you for any specific questions about what was found during the examination.  If the procedure report does not answer your questions, please call your gastroenterologist to clarify.  If you requested that your care partner not be given the details of your procedure findings, then the procedure report has been included in a sealed envelope for you to review at your convenience later.  YOU SHOULD EXPECT: Some feelings of bloating in the abdomen. Passage of more gas than usual.  Walking can help get rid of the air that was put into your GI tract during the procedure and reduce the bloating. If you had a lower endoscopy (such as a colonoscopy or flexible sigmoidoscopy) you may notice spotting of blood in your stool or on the toilet paper. If you underwent a bowel prep for your procedure, you may not have a normal bowel movement for a few days.  Please Note:  You might notice some irritation and congestion in your nose or some drainage.  This is from the oxygen used during your procedure.  There is no need for concern and it should clear up in a day or so.  SYMPTOMS TO REPORT IMMEDIATELY:   Following lower endoscopy (colonoscopy or flexible sigmoidoscopy):  Excessive amounts of blood in the stool  Significant tenderness or worsening of abdominal pains  Swelling of the abdomen that is new, acute  Fever of 100F or higher   Following upper endoscopy (EGD)  Vomiting of blood or coffee ground material  New chest pain or pain under the shoulder blades  Painful or persistently difficult swallowing  New shortness of breath  Fever of 100F or higher  Black, tarry-looking stools  For urgent or emergent issues, a gastroenterologist can be  reached at any hour by calling [phone].   DIET:  We do recommend a small meal at first, but then you may proceed to your regular diet.  Drink plenty of fluids but you should avoid alcoholic beverages for 24 hours.  ACTIVITY:  You should plan to take it easy for the rest of today and you should NOT DRIVE or use heavy machinery until tomorrow (because of the sedation medicines used during the test).    FOLLOW UP: Our staff will call the number listed on your records the next business day following your procedure to check on you and address any questions or concerns that you may have regarding the information given to you following your procedure. If we do not reach you, we will leave a message.  However, if you are feeling well and you are not experiencing any problems, there is no need to return our call.  We will assume that you have returned to your regular daily activities without incident.  If any biopsies were taken you will be contacted by phone or by letter within the next 1-3 weeks.  Please call us at [phone] if you have not heard about the biopsies in 3 weeks.    SIGNATURES/CONFIDENTIALITY: You and/or your care partner have signed paperwork which will be entered into your electronic medical record.  These signatures attest to the fact that that the information above on your After Visit Summary has been reviewed and is understood.  Full  responsibility of the confidentiality of this discharge information lies with you and/or your care-partner.

## 2017-09-29 NOTE — Progress Notes (Signed)
Patient has small cut on right lower lip.Either from biting lip or bite block. Dr. Henrene Pastor aware. No orders from him.Henrene Pastor said it woul dheal on its own.

## 2017-09-29 NOTE — Progress Notes (Signed)
Pt's states no medical or surgical changes since previsit or office visit. 

## 2017-09-29 NOTE — Op Note (Signed)
[place] Patient Name: [name] Procedure Date: 09/29/2017 3:02 PM MRN: [id number] Endoscopist: Docia Chuck. Henrene Pastor , MD Age: 67 Referring MD:  Date of Birth: 08-Jul-1950 Gender: Female Account #: [id number] Procedure:                Upper GI endoscopy Indications:              Unexplained iron deficiency anemia Medicines:                Monitored Anesthesia Care Procedure:                Pre-Anesthesia Assessment:                           - Prior to the procedure, a History and Physical                            was performed, and patient medications and                            allergies were reviewed. The patient's tolerance of                            previous anesthesia was also reviewed. The risks                            and benefits of the procedure and the sedation                            options and risks were discussed with the patient.                            All questions were answered, and informed consent                            was obtained. Prior Anticoagulants: The patient has                            taken no previous anticoagulant or antiplatelet                            agents. ASA Grade Assessment: II - A patient with                            mild systemic disease. After reviewing the risks                            and benefits, the patient was deemed in                            satisfactory condition to undergo the procedure.                           After obtaining informed consent, the endoscope was  passed under direct vision. Throughout the                            procedure, the patient's blood pressure, pulse, and                            oxygen saturations were monitored continuously. The                            Endoscope was introduced through the mouth, and                            advanced to the Third part of duodenum. The upper                            GI endoscopy was  accomplished without difficulty.                            The patient tolerated the procedure well. Scope In: Scope Out: Findings:                 The esophagus was normal.                           The stomach was normal, Save small hiatal hernia.                           The examined duodenum was normal.                           The cardia and gastric fundus were normal on                            retroflexion. Complications:            No immediate complications. Estimated Blood Loss:     Estimated blood loss: none. Impression:               - Normal esophagus.                           - Normal stomach.                           - Normal examined duodenum.                           - No specimens collected. Recommendation:           1. Resume previous diet                           2. Continue iron sulfate 325 mg twice daily                           3. Have your blood counts and iron levels checked  by Dr. Alain Marion                           4. Return to the care of Dr. Alain Marion. Docia Chuck. Henrene Pastor, MD 09/29/2017 4:33:35 PM This report has been signed electronically.

## 2017-09-29 NOTE — Op Note (Signed)
[place] Patient Name: [name] Procedure Date: 09/29/2017 3:02 PM MRN: [id number] Endoscopist: Docia Chuck. Henrene Pastor , MD Age: 66 Referring MD:  Date of Birth: 08/13/1950 Gender: Female Account #: [id number] Procedure:                Colonoscopy, With cold snare polypectomy x 2 Indications:              Unexplained iron deficiency anemia. Patient with a                            history of colon cancer 1998 status post right                            hemicolectomy. Surveillance examinations 2000 and                            2001. The patient lost to follow-up thereafter by                            her own accord Medicines:                Monitored Anesthesia Care Procedure:                Pre-Anesthesia Assessment:                           - Prior to the procedure, a History and Physical                            was performed, and patient medications and                            allergies were reviewed. The patient's tolerance of                            previous anesthesia was also reviewed. The risks                            and benefits of the procedure and the sedation                            options and risks were discussed with the patient.                            All questions were answered, and informed consent                            was obtained. Prior Anticoagulants: The patient has                            taken no previous anticoagulant or antiplatelet                            agents. ASA Grade Assessment: II - A patient with  mild systemic disease. After reviewing the risks                            and benefits, the patient was deemed in                            satisfactory condition to undergo the procedure.                           After obtaining informed consent, the colonoscope                            was passed under direct vision. Throughout the                            procedure, the  patient's blood pressure, pulse, and                            oxygen saturations were monitored continuously. The                            Colonoscope was introduced through the anus and                            advanced to the the cecum, identified by                            appendiceal orifice and ileocecal valve. The                            terminal ileum and the rectum were photographed.                            The quality of the bowel preparation was good. The                            colonoscopy was performed without difficulty. The                            patient tolerated the procedure well. The bowel                            preparation used was SUPREP. Scope In: 3:54:05 PM Scope Out: 4:13:36 PM Scope Withdrawal Time: 0 hours 15 minutes 39 seconds  Total Procedure Duration: 0 hours 19 minutes 31 seconds  Findings:                 Two polyps were found in the proximal transverse                            colon and hepatic flexure. The polyps were 4 to 6                            mm in size. These polyps were  removed with a cold                            snare. Resection and retrieval were complete.                           Scattered diverticula were found in the colon.                           Internal hemorrhoids were found during retroflexion.                           There was evidence of prior right hemicolectomy.The                            anastomosis was side to end. There were focal                            ulcers along the blind ileal anastomosis. The true                            neo-ileum was deeply intubated and normal.The exam                            was otherwise without abnormality on direct and                            retroflexion views. Complications:            No immediate complications. Estimated blood loss:                            None. Estimated Blood Loss:     Estimated blood loss: none. Impression:               -  Two 4 to 6 mm polyps in the proximal transverse                            colon and at the hepatic flexure, removed with a                            cold snare. Resected and retrieved.                           - Diverticulosis.                           - Internal hemorrhoids.                           - Status post right hemicolectomy. Focal                            anastomotic ileal ulcers. Normal deep intubation of  the neo-ileum.                           - The examination was otherwise normal on direct                            and retroflexion views. Recommendation:           - Repeat colonoscopy in 5 years for surveillance.                           - Continue iron sulfate 325 mg twice daily.                           - Have a follow-up CBC with Dr. Alain Marion.                           - Upper endoscopy today. Please see report for                            results.                           - Await pathology results. Docia Chuck. Henrene Pastor, MD 09/29/2017 4:30:50 PM This report has been signed electronically.

## 2017-09-29 NOTE — Progress Notes (Signed)
Report to PACU, RN, vss, BBS= Clear.  

## 2017-09-30 ENCOUNTER — Telehealth: Payer: Self-pay

## 2017-09-30 NOTE — Telephone Encounter (Signed)
  Follow up Call-  Call back number 09/29/2017  Post procedure Call Back phone  # [phone]  Permission to leave phone message Yes  Some recent data might be hidden     Patient questions:  Do you have a fever, pain , or abdominal swelling? No. Pain Score  0 *  Have you tolerated food without any problems? Yes.    Have you been able to return to your normal activities? Yes.    Do you have any questions about your discharge instructions: Diet   No. Medications  No. Follow up visit  No.  Do you have questions or concerns about your Care? No.  Actions: * If pain score is 4 or above: No action needed, pain <4.

## 2017-10-05 ENCOUNTER — Encounter: Payer: Self-pay | Admitting: Internal Medicine

## 2017-10-08 ENCOUNTER — Ambulatory Visit: Payer: Medicare Other | Admitting: Cardiovascular Disease

## 2017-10-26 ENCOUNTER — Other Ambulatory Visit (INDEPENDENT_AMBULATORY_CARE_PROVIDER_SITE_OTHER): Payer: Medicare Other

## 2017-10-26 ENCOUNTER — Ambulatory Visit (INDEPENDENT_AMBULATORY_CARE_PROVIDER_SITE_OTHER): Payer: Medicare Other | Admitting: Internal Medicine

## 2017-10-26 ENCOUNTER — Encounter: Payer: Self-pay | Admitting: Internal Medicine

## 2017-10-26 DIAGNOSIS — I2510 Atherosclerotic heart disease of native coronary artery without angina pectoris: Secondary | ICD-10-CM | POA: Diagnosis not present

## 2017-10-26 DIAGNOSIS — E1159 Type 2 diabetes mellitus with other circulatory complications: Secondary | ICD-10-CM | POA: Diagnosis not present

## 2017-10-26 DIAGNOSIS — Z85038 Personal history of other malignant neoplasm of large intestine: Secondary | ICD-10-CM | POA: Diagnosis not present

## 2017-10-26 DIAGNOSIS — I10 Essential (primary) hypertension: Secondary | ICD-10-CM

## 2017-10-26 DIAGNOSIS — N184 Chronic kidney disease, stage 4 (severe): Secondary | ICD-10-CM | POA: Diagnosis not present

## 2017-10-26 DIAGNOSIS — G8929 Other chronic pain: Secondary | ICD-10-CM | POA: Diagnosis not present

## 2017-10-26 DIAGNOSIS — M5440 Lumbago with sciatica, unspecified side: Secondary | ICD-10-CM | POA: Diagnosis not present

## 2017-10-26 DIAGNOSIS — E1122 Type 2 diabetes mellitus with diabetic chronic kidney disease: Secondary | ICD-10-CM

## 2017-10-26 DIAGNOSIS — D509 Iron deficiency anemia, unspecified: Secondary | ICD-10-CM | POA: Diagnosis not present

## 2017-10-26 DIAGNOSIS — E785 Hyperlipidemia, unspecified: Secondary | ICD-10-CM

## 2017-10-26 LAB — CBC WITH DIFFERENTIAL/PLATELET
Basophils Absolute: 0 10*3/uL (ref 0.0–0.1)
Basophils Relative: 0.5 % (ref 0.0–3.0)
Eosinophils Absolute: 0.1 10*3/uL (ref 0.0–0.7)
Eosinophils Relative: 1.5 % (ref 0.0–5.0)
HCT: 29.9 % — ABNORMAL LOW (ref 36.0–46.0)
Hemoglobin: 10 g/dL — ABNORMAL LOW (ref 12.0–15.0)
Lymphocytes Relative: 19.1 % (ref 12.0–46.0)
Lymphs Abs: 0.9 10*3/uL (ref 0.7–4.0)
MCHC: 33.3 g/dL (ref 30.0–36.0)
MCV: 85.4 fl (ref 78.0–100.0)
Monocytes Absolute: 0.5 10*3/uL (ref 0.1–1.0)
Monocytes Relative: 9.5 % (ref 3.0–12.0)
Neutro Abs: 3.3 10*3/uL (ref 1.4–7.7)
Neutrophils Relative %: 69.4 % (ref 43.0–77.0)
Platelets: 146 10*3/uL — ABNORMAL LOW (ref 150.0–400.0)
RBC: 3.5 Mil/uL — ABNORMAL LOW (ref 3.87–5.11)
RDW: 15.4 % (ref 11.5–15.5)
WBC: 4.8 10*3/uL (ref 4.0–10.5)

## 2017-10-26 LAB — IRON,TIBC AND FERRITIN PANEL
%SAT: 20 % (calc) (ref 11–50)
Ferritin: 24 ng/mL (ref 20–288)
Iron: 62 ug/dL (ref 45–160)
TIBC: 308 mcg/dL (calc) (ref 250–450)

## 2017-10-26 LAB — BASIC METABOLIC PANEL
BUN: 44 mg/dL — ABNORMAL HIGH (ref 6–23)
CO2: 24 mEq/L (ref 19–32)
Calcium: 9.5 mg/dL (ref 8.4–10.5)
Chloride: 108 mEq/L (ref 96–112)
Creatinine, Ser: 2.42 mg/dL — ABNORMAL HIGH (ref 0.40–1.20)
GFR: 21.2 mL/min — ABNORMAL LOW (ref >60.00)
Glucose, Bld: 214 mg/dL — ABNORMAL HIGH (ref 70–99)
Potassium: 5 mEq/L (ref 3.5–5.1)
Sodium: 137 mEq/L (ref 135–145)

## 2017-10-26 LAB — HEMOGLOBIN A1C: Hgb A1c MFr Bld: 9.3 % — ABNORMAL HIGH (ref 4.6–6.5)

## 2017-10-26 MED ORDER — AMLODIPINE BESYLATE 10 MG PO TABS: 5.0000 mg | ORAL_TABLET | Freq: Every day | ORAL | 3 refills | Status: DC

## 2017-10-26 MED ORDER — GLIPIZIDE ER 5 MG PO TB24: 5.0000 mg | ORAL_TABLET | Freq: Every day | ORAL | 3 refills | Status: DC

## 2017-10-26 MED ORDER — FUROSEMIDE 80 MG PO TABS: 40.0000 mg | ORAL_TABLET | Freq: Every day | ORAL | 3 refills | Status: DC

## 2017-10-26 MED ORDER — POTASSIUM CHLORIDE CRYS ER 20 MEQ PO TBCR: 20.0000 meq | EXTENDED_RELEASE_TABLET | Freq: Every day | ORAL | 3 refills | Status: DC

## 2017-10-26 NOTE — Assessment & Plan Note (Signed)
Norvasc, Valsartan, Coreg

## 2017-10-26 NOTE — Assessment & Plan Note (Signed)
Last colon 4/19, due another in 5 years

## 2017-10-26 NOTE — Patient Instructions (Addendum)
Hold Rosuvastatin for 2-3 weeks due to muscle cramps/pains

## 2017-10-26 NOTE — Assessment & Plan Note (Signed)
Hold Rosuvastatin for 2-3 weeks due to muscle cramps/pains

## 2017-10-26 NOTE — Assessment & Plan Note (Signed)
CBC

## 2017-10-26 NOTE — Progress Notes (Signed)
Subjective:  Patient ID: [name], female    DOB: [birth year]  Age: 67 y.o. MRN: [id number]  CC: No chief complaint on file.   HPI [name] presents for DM, dyslipidemia, HTN, anemia f/u. C/o pain in the R arm/leg x 1 month... C/o cramps  Outpatient Medications Prior to Visit  Medication Sig Dispense Refill  . aspirin 81 MG chewable tablet Chew 1 tablet (81 mg total) by mouth daily.    . calcium carbonate (OS-CAL) 600 MG TABS tablet Take 600 mg by mouth 2 (two) times daily with a meal.    . ferrous sulfate 325 (65 FE) MG tablet Take 1 tablet (325 mg total) by mouth 2 (two) times daily with a meal. 60 tablet 5  . fluticasone (FLONASE) 50 MCG/ACT nasal spray Place 2 sprays into both nostrils daily. 48 g 3  . hydrOXYzine (ATARAX/VISTARIL) 25 MG tablet Take 1-2 tablets (25-50 mg total) by mouth every 8 (eight) hours as needed for itching. 60 tablet 0  . loratadine (CLARITIN) 10 MG tablet Take 1 tablet (10 mg total) by mouth daily. 100 tablet 3  . nebivolol (BYSTOLIC) 10 MG tablet Take 1 tablet (10 mg total) by mouth daily. 90 tablet 3  . nitroGLYCERIN (NITROSTAT) 0.4 MG SL tablet DISSOLVE 1 TABLET UNDER THE TONGUE EVERY 5 MINUTES AS  NEEDED FOR CHEST PAIN 90 tablet 0  . ONE TOUCH ULTRA TEST test strip USE TO TEST BLOOD SUGAR TWO TIMES A DAY AS DIRECTED 100 each 11  . ranitidine (ZANTAC) 150 MG tablet Take 1 tablet (150 mg total) by mouth 2 (two) times daily. 60 tablet 11  . rosuvastatin (CRESTOR) 5 MG tablet TAKE 1 TABLET BY MOUTH  DAILY AT 6PM 90 tablet 2  . traMADol-acetaminophen (ULTRACET) 37.5-325 MG tablet Take 0.5-1 tablets by mouth every 8 (eight) hours as needed for severe pain. 100 tablet 3  . triamcinolone cream (KENALOG) 0.1 % Apply 1 application topically 4 (four) times daily. 450 g 3  . vitamin B-12 (CYANOCOBALAMIN) 1000 MCG tablet Take 1,000 mcg by mouth daily.    [name] amLODipine (NORVASC) 10 MG tablet Take 0.5 tablets (5 mg total) by mouth daily. 45 tablet 2  .  furosemide (LASIX) 80 MG tablet TAKE ONE-HALF TABLET BY  MOUTH DAILY 45 tablet 1  . glipiZIDE (GLUCOTROL XL) 5 MG 24 hr tablet Take 1 tablet (5 mg total) by mouth daily with breakfast. 90 tablet 2  . potassium chloride SA (K-DUR,KLOR-CON) 20 MEQ tablet Take 1 tablet (20 mEq total) by mouth daily. 90 tablet 3  . losartan (COZAAR) 50 MG tablet Take 1 tablet (50 mg total) by mouth daily. 90 tablet 3   Facility-Administered Medications Prior to Visit  Medication Dose Route Frequency Provider Last Rate Last Dose  . 0.9 %  sodium chloride infusion  500 mL Intravenous Once Irene Shipper, MD        ROS Review of Systems  Constitutional: Positive for fatigue. Negative for activity change, appetite change, chills and unexpected weight change.  HENT: Negative for congestion, mouth sores and sinus pressure.   Eyes: Negative for visual disturbance.  Respiratory: Negative for cough and chest tightness.   Gastrointestinal: Negative for abdominal pain and nausea.  Genitourinary: Negative for difficulty urinating, frequency and vaginal pain.  Musculoskeletal: Positive for arthralgias, back pain and myalgias. Negative for gait problem.  Skin: Negative for pallor and rash.  Neurological: Negative for dizziness, tremors, weakness, numbness and headaches.  Psychiatric/Behavioral: Negative for confusion  and sleep disturbance.    Objective:  BP 136/74 (BP Location: Left Arm, Patient Position: Sitting, Cuff Size: Large)   Pulse 69   Temp 98.2 F (36.8 C) (Oral)   Ht 5\' 2"  (1.575 m)   Wt 207 lb (93.9 kg)   SpO2 99%   BMI 37.86 kg/m   BP Readings from Last 3 Encounters:  10/26/17 136/74  09/29/17 (!) 164/68  08/17/17 (!) 142/78    Wt Readings from Last 3 Encounters:  10/26/17 207 lb (93.9 kg)  09/29/17 209 lb (94.8 kg)  08/17/17 209 lb (94.8 kg)    Physical Exam  Constitutional: She appears well-developed. No distress.  HENT:  Head: Normocephalic.  Right Ear: External ear normal.  Left Ear:  External ear normal.  Nose: Nose normal.  Mouth/Throat: Oropharynx is clear and moist.  Eyes: Pupils are equal, round, and reactive to light. Conjunctivae are normal. Right eye exhibits no discharge. Left eye exhibits no discharge.  Neck: Normal range of motion. Neck supple. No JVD present. No tracheal deviation present. No thyromegaly present.  Cardiovascular: Normal rate, regular rhythm and normal heart sounds.  Pulmonary/Chest: No stridor. No respiratory distress. She has no wheezes.  Abdominal: Soft. Bowel sounds are normal. She exhibits no distension and no mass. There is no tenderness. There is no rebound and no guarding.  Musculoskeletal: She exhibits tenderness. She exhibits no edema.  Lymphadenopathy:    She has no cervical adenopathy.  Neurological: She displays normal reflexes. No cranial nerve deficit. She exhibits normal muscle tone. Coordination normal.  Skin: No rash noted. No erythema.  Psychiatric: She has a normal mood and affect. Her behavior is normal. Judgment and thought content normal.  RUA and RLE muscles - tender  Lab Results  Component Value Date   WBC 5.0 06/25/2017   HGB 9.5 (L) 06/25/2017   HCT 29.7 (L) 06/25/2017   PLT 148.0 (L) 06/25/2017   GLUCOSE 205 (H) 03/12/2017   CHOL 119 08/17/2017   TRIG 64.0 08/17/2017   HDL 48.90 08/17/2017   LDLCALC 57 08/17/2017   ALT 10 06/25/2017   AST 12 06/25/2017   NA 137 03/12/2017   K 4.9 03/12/2017   CL 104 03/12/2017   CREATININE 2.26 (H) 03/12/2017   BUN 40 (H) 03/12/2017   CO2 17 (L) 03/12/2017   TSH 1.63 06/25/2017   INR 1.16 06/07/2013   HGBA1C 8.6 (H) 06/25/2017   MICROALBUR 13.9 (H) 10/13/2007    No results found.  Assessment & Plan:   There are no diagnoses linked to this encounter. I have changed Sybol A. Staller's furosemide. I am also having her maintain her vitamin B-12, aspirin, calcium carbonate, fluticasone, triamcinolone cream, hydrOXYzine, loratadine, rosuvastatin, nebivolol,  losartan, nitroGLYCERIN, ranitidine, traMADol-acetaminophen, ferrous sulfate, ONE TOUCH ULTRA TEST, glipiZIDE, amLODipine, and potassium chloride SA. We will continue to administer sodium chloride.  Meds ordered this encounter  Medications  . glipiZIDE (GLUCOTROL XL) 5 MG 24 hr tablet    Sig: Take 1 tablet (5 mg total) by mouth daily with breakfast.    Dispense:  90 tablet    Refill:  3  . amLODipine (NORVASC) 10 MG tablet    Sig: Take 0.5 tablets (5 mg total) by mouth daily.    Dispense:  45 tablet    Refill:  3  . potassium chloride SA (K-DUR,KLOR-CON) 20 MEQ tablet    Sig: Take 1 tablet (20 mEq total) by mouth daily.    Dispense:  90 tablet    Refill:  3  . furosemide (LASIX) 80 MG tablet    Sig: Take 0.5 tablets (40 mg total) by mouth daily.    Dispense:  45 tablet    Refill:  3     Follow-up: No follow-ups on file.  Walker Kehr, MD

## 2017-10-26 NOTE — Assessment & Plan Note (Signed)
Monitor BMET. 

## 2017-10-26 NOTE — Assessment & Plan Note (Signed)
Labs

## 2017-10-26 NOTE — Assessment & Plan Note (Signed)
Hold Rosuvastatin for 2-3 weeks due to muscle cramps/pains May need to use Livalo

## 2017-10-28 DIAGNOSIS — E113512 Type 2 diabetes mellitus with proliferative diabetic retinopathy with macular edema, left eye: Secondary | ICD-10-CM | POA: Diagnosis not present

## 2017-11-08 ENCOUNTER — Other Ambulatory Visit: Payer: Self-pay | Admitting: Internal Medicine

## 2017-11-08 DIAGNOSIS — Z1231 Encounter for screening mammogram for malignant neoplasm of breast: Secondary | ICD-10-CM

## 2017-11-10 DIAGNOSIS — E113511 Type 2 diabetes mellitus with proliferative diabetic retinopathy with macular edema, right eye: Secondary | ICD-10-CM | POA: Diagnosis not present

## 2017-11-17 ENCOUNTER — Other Ambulatory Visit: Payer: Self-pay | Admitting: Internal Medicine

## 2017-11-17 DIAGNOSIS — N184 Chronic kidney disease, stage 4 (severe): Principal | ICD-10-CM

## 2017-11-17 DIAGNOSIS — E1122 Type 2 diabetes mellitus with diabetic chronic kidney disease: Secondary | ICD-10-CM

## 2017-12-03 ENCOUNTER — Encounter: Payer: Self-pay | Admitting: Cardiovascular Disease

## 2017-12-03 ENCOUNTER — Ambulatory Visit (INDEPENDENT_AMBULATORY_CARE_PROVIDER_SITE_OTHER): Payer: Medicare Other | Admitting: Cardiovascular Disease

## 2017-12-03 VITALS — BP 140/70 | HR 80 | Ht 62.0 in | Wt 207.8 lb

## 2017-12-03 DIAGNOSIS — E782 Mixed hyperlipidemia: Secondary | ICD-10-CM

## 2017-12-03 DIAGNOSIS — D631 Anemia in chronic kidney disease: Secondary | ICD-10-CM | POA: Diagnosis not present

## 2017-12-03 DIAGNOSIS — I5042 Chronic combined systolic (congestive) and diastolic (congestive) heart failure: Secondary | ICD-10-CM | POA: Diagnosis not present

## 2017-12-03 DIAGNOSIS — N184 Chronic kidney disease, stage 4 (severe): Secondary | ICD-10-CM | POA: Diagnosis not present

## 2017-12-03 DIAGNOSIS — N2581 Secondary hyperparathyroidism of renal origin: Secondary | ICD-10-CM | POA: Diagnosis not present

## 2017-12-03 DIAGNOSIS — I10 Essential (primary) hypertension: Secondary | ICD-10-CM

## 2017-12-03 DIAGNOSIS — I2510 Atherosclerotic heart disease of native coronary artery without angina pectoris: Secondary | ICD-10-CM | POA: Diagnosis not present

## 2017-12-03 MED ORDER — HYDRALAZINE HCL 25 MG PO TABS: 25.0000 mg | ORAL_TABLET | Freq: Three times a day (TID) | ORAL | 3 refills | Status: DC

## 2017-12-03 NOTE — Progress Notes (Signed)
Cardiology Office Note   Date:  12/03/2017   ID:  [name], DOB 1950-09-22, MRN [id number]  PCP:  [name] Anger, [name]  Cardiologist:   [name] Moores, [name]   Chief Complaint  Patient presents with  . Congestive Heart Failure  . Hypertension   1. Coronary artery disease-status post PTCA and stenting of her mid LAD using a 3.5 x 15 mm Alpine stent ( DES) ( Dec. 5, 2014)  2. chronic systolic congestive heart failure-ejection fraction of 35% 2. Hypertension 3. COPD 4. Diabetes mellitus 5. History colon cancer    [name] is seen today. She was initially [name] in [name]. She has a history of chronic systolic congestive heart failure with an ejection fraction of around 35%. She has a history of coronary artery disease. She is status post PTCA and stenting of her mid left anterior descending artery using a 3.5 x 15 mm Alpine stent. She has a history of COPD. [name] not had a cigarette since he left the [name]. She also has a history of diabetes mellitus and history of colon cancer  She has mild interscapular pain this morning . And the pain resolved after she drank some club soda. Did not feel like her MI pain.  She has been walking every morining at the mall and at [place].   Her BP is up today. - she may have had lots of salt last night. The BP has remained high   September 20, 2013  [name] has been having some episodes of chest discomfort. She initially thought it might be due to indigestion or due to something that she ate. It radiated to her interscapular region. It was associated with some hand and normal tingling. The pain was intermittent but lasted for several hours. She took a sublingual nitroglycerin and the pain resolved very quickly and she did not have any further episodes the whole rest of the day.  She informed that she ran out of her Lasix 3 days ago.  November 01, 2013:  [name] is feeling well. She stopped smoking the day  of her cath. She is eating more "penny candy" to replace the habit.   January 31, 2014:  She is doing ok. She has no angina. Not exercising. 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  She has a bruise on her leg where she bumped into a table.   Oct. 29, 2015:  [name] is doing very well. She is not having any episodes of chest pain. [name] not exercising quite as much as she would like. Stent was placed in December of 2014. She had a nose bleed last week. ( she is on Effient until Dec. 2015). Her Bp was A bit elevated because of some salt in her diet.  No CP or dyspnea    November 02, 2014:  [name] is a 67 y.o. female who presents for follow-up of her coronary artery disease. [name] done fairly well. She has had some muscle pain and a rash. She has occasional episodes of dizziness. She had some non-cardiac CP several weeks ago when she started Trajenta.  Stopped the med and has not had any further episodes of CP.    Oct. 26, 2016  [name] is doing well She had a echo recently   Left ventricle: The cavity size was normal. Wall thickness was increased in a pattern of mild LVH with focal basal septal hypertrophy. Systolic function was normal. The estimated ejection fraction was in  the range of 60% to 65%. Wall motion was normal; there were no regional wall motion abnormalities. Doppler parameters are consistent with abnormal left ventricular relaxation (grade 1 diastolic dysfunction). - Aortic valve: There was no stenosis. There was trivial regurgitation. - Mitral valve: Mildly calcified annulus. Mildly calcified leaflets . There was no significant regurgitation. - Right ventricle: The cavity size was normal. Systolic function was normal. - Tricuspid valve: Peak RV-RA gradient (S): 21 mm Hg. - Pulmonary arteries: PA peak pressure: 24 mm Hg (S). - Inferior vena cava: The vessel was normal in size. The respirophasic diameter changes were in the normal  range (>= 50%), consistent with normal central venous pressure  [name] is doing well  BP has been elevated for the past 2 weeks.  Has had some left arm pain / ached Left arm aches all the time,  Constant.  Worse with sitting, not worsened by exercise .   October 28, 2015:  [name] is doing well.  She went with [facility] - it took 2 weeks for them to get her meds to her .  Has chronic left arm pain .  Oct. 23, 2017:  Doing well from a cardiac standpoint Having eye surgery . Has had a chronic cough [name] changed her Coreg to [name] Did not seem to help the cough   Jan. 24, 2018:  doing well. Still eating some salty foods.  Still smoking some,  Advised her to quit.  Echo from 2016 shows normal LV systolic function Needs to have an contrast imaging study of her eye. She is at low risk for that procedure.   Aug. 17, 2018:  [name] is seen back today for follow up of her CHF   Dec 03, 2017:    [name] is seen back for follow up of her CHF and HTN Doing well. EF is now normal.   Gets some exercise - not as much as she would like  Has some ankle swelling  Has had some arm pain that she associated with the Losartan ( MSK pain is mentioned in [facility])     Past Medical History:  Diagnosis Date  . Anemia   . COPD (chronic obstructive pulmonary disease) ([place])   . Diabetes mellitus   . Hyperlipidemia   . Hypertension   . Ischemic cardiomyopathy 06/11/2013  . Low back pain   . NSTEMI (non-ST elevated myocardial infarction) ([place]) 06/11/2013  . Obesity   . Personal history of colon cancer   . Renal insufficiency     Past Surgical History:  Procedure Laterality Date  . COLECTOMY    . LEFT HEART CATHETERIZATION WITH CORONARY ANGIOGRAM N/A 06/09/2013   Procedure: LEFT HEART CATHETERIZATION WITH CORONARY ANGIOGRAM;  Surgeon: [name] Dial, [name];  Location: [name];  Service: Cardiovascular;  Laterality: N/A;     Current Outpatient Medications    Medication Sig Dispense Refill  . amLODipine (NORVASC) 10 MG tablet Take 0.5 tablets (5 mg total) by mouth daily. 45 tablet 3  . aspirin 81 MG chewable tablet Chew 1 tablet (81 mg total) by mouth daily.    . calcium carbonate (OS-CAL) 600 MG TABS tablet Take 600 mg by mouth 2 (two) times daily with a meal.    . ferrous sulfate 325 (65 FE) MG tablet Take 1 tablet (325 mg total) by mouth 2 (two) times daily with a meal. 60 tablet 5  . fluticasone (FLONASE) 50 MCG/ACT nasal spray Place 2 sprays into both nostrils daily  as needed for allergies or rhinitis.    . furosemide (LASIX) 80 MG tablet Take 0.5 tablets (40 mg total) by mouth daily. 45 tablet 3  . glipiZIDE (GLUCOTROL XL) 5 MG 24 hr tablet Take 1 tablet (5 mg total) by mouth daily with breakfast. 90 tablet 3  . hydrOXYzine (ATARAX/VISTARIL) 25 MG tablet Take 1-2 tablets (25-50 mg total) by mouth every 8 (eight) hours as needed for itching. 60 tablet 0  . loratadine (CLARITIN) 10 MG tablet Take 1 tablet (10 mg total) by mouth daily. 100 tablet 3  . nebivolol (BYSTOLIC) 10 MG tablet Take 1 tablet (10 mg total) by mouth daily. 90 tablet 3  . nitroGLYCERIN (NITROSTAT) 0.4 MG SL tablet DISSOLVE 1 TABLET UNDER THE TONGUE EVERY 5 MINUTES AS  NEEDED FOR CHEST PAIN 90 tablet 0  . ONE TOUCH ULTRA TEST test strip USE TO TEST BLOOD SUGAR TWO TIMES A DAY AS DIRECTED 100 each 11  . potassium chloride SA (K-DUR,KLOR-CON) 20 MEQ tablet Take 1 tablet (20 mEq total) by mouth daily. 90 tablet 3  . ranitidine (ZANTAC) 150 MG tablet Take 1 tablet (150 mg total) by mouth 2 (two) times daily. 60 tablet 11  . rosuvastatin (CRESTOR) 5 MG tablet TAKE 1 TABLET BY MOUTH  DAILY AT 6PM 90 tablet 2  . traMADol-acetaminophen (ULTRACET) 37.5-325 MG tablet Take 0.5-1 tablets by mouth every 8 (eight) hours as needed for severe pain. 100 tablet 3  . triamcinolone cream (KENALOG) 0.1 % Apply 1 application topically 4 (four) times daily. 450 g 3  . vitamin B-12 (CYANOCOBALAMIN)  1000 MCG tablet Take 1,000 mcg by mouth daily.    . hydrALAZINE (APRESOLINE) 25 MG tablet Take 1 tablet (25 mg total) by mouth 3 (three) times daily. 270 tablet 3   No current facility-administered medications for this visit.     Allergies:   Tradjenta [linagliptin]; Atorvastatin; Enalapril maleate; Farxiga [dapagliflozin]; Hydrochlorothiazide; Kenalog [triamcinolone acetonide]; Metformin and related; Propoxyphene n-acetaminophen; Simvastatin; and Spironolactone    Social History:  The patient  reports that she has been smoking cigarettes.  She has never used smokeless tobacco. She reports that she does not drink alcohol or use drugs.   Family History:  The patient's family history includes Hypertension in her unknown relative.    ROS:   Noted in current hx , otherwise negative    Physical Exam: Blood pressure 140/70, pulse 80, height 5\' 2"  (1.575 m), weight 207 lb 12 oz (94.2 kg).  GEN:  Well nourished, well developed in no acute distress HEENT: Normal NECK: No JVD; No carotid bruits LYMPHATICS: No lymphadenopathy CARDIAC: RRR RESPIRATORY:  Clear to auscultation without rales, wheezing or rhonchi  ABDOMEN: Soft, non-tender, non-distended MUSCULOSKELETAL:  Mild edema  SKIN: Warm and dry NEUROLOGIC:  Alert and oriented x 3   EKG:   Dec 03, 2017: Normal sinus rhythm at 69.  No ST or T wave changes.  Recent Labs: 06/25/2017: ALT 10; TSH 1.63 10/26/2017: BUN 44; Creatinine, Ser 2.42; Hemoglobin 10.0; Platelets 146.0; Potassium 5.0; Sodium 137    Lipid Panel    Component Value Date/Time   CHOL 119 08/17/2017 1124   TRIG 64.0 08/17/2017 1124   HDL 48.90 08/17/2017 1124   CHOLHDL 2 08/17/2017 1124   VLDL 12.8 08/17/2017 1124   LDLCALC 57 08/17/2017 1124      Wt Readings from Last 3 Encounters:  12/03/17 207 lb 12 oz (94.2 kg)  10/26/17 207 lb (93.9 kg)  09/29/17 209 lb (94.8 kg)  Other studies Reviewed: Additional studies/ records that were reviewed today  include: . Review of the above records demonstrates:    ASSESSMENT AND PLAN:  1. Coronary artery disease-status post PTCA and stenting of her mid LAD using a 3.5 x 15 mm Alpine stent ( DES) ( Dec. 5, 2014)  No further episodes of angina .   Doing well   2. chronic systolic congestive heart failure- initial ejection fraction of 35% -  Ejection fraction has now normalized. 2. Hypertension -    Blood pressure is elevated today.  She complains of having right arm pain after starting losartan.  I have researched it in [facility] and musculoskeletal pain is indeed listed as an adverse reaction.  We will discontinue the losartan and start on hydralazine 25 mg 3 times a day.  3.  Left leg edema:   ? Due to amlodipine  - needs to reduce salt intake   4. Diabetes mellitus 5. History colon cancer 6. COPD - encouraged smoking cessation   Current medicines are reviewed at length with the patient today.  The patient does not have concerns regarding medicines.      Labs/ tests ordered today include:   Orders Placed This Encounter  Procedures  . Lipid Profile  . Basic Metabolic Panel (BMET)  . Hepatic function panel  . EKG 12-Lead        [name] Moores, [name]  12/03/2017 11:58 AM    [facility] [address], [place], [place]  [address] Phone: [phone]; Fax: [phone]

## 2017-12-03 NOTE — Patient Instructions (Signed)
Medication Instructions:  Your physician has recommended you make the following change in your medication:   STOP Losartan (Cozaar) START Hydralazine (Apresoline) 25 mg three times per day (approximately every 8 hours)    Labwork: Your physician recommends that you return for lab work in: 4 months on the day of or a few days before your office visit with Dr. Acie Fredrickson.  You will need to FAST for this appointment - nothing to eat or drink after midnight the night before except water.    Testing/Procedures: None Ordered    Follow-Up: Your physician recommends that you schedule a follow-up appointment in: 4 months with Dr. Acie Fredrickson   If you need a refill on your cardiac medications before your next appointment, please call your pharmacy.   Thank you for choosing [facility]! Christen Bame, RN [phone]

## 2017-12-09 ENCOUNTER — Other Ambulatory Visit: Payer: Self-pay | Admitting: Nephrology

## 2017-12-09 DIAGNOSIS — D631 Anemia in chronic kidney disease: Secondary | ICD-10-CM

## 2017-12-09 DIAGNOSIS — N184 Chronic kidney disease, stage 4 (severe): Secondary | ICD-10-CM

## 2017-12-09 DIAGNOSIS — N189 Chronic kidney disease, unspecified: Secondary | ICD-10-CM

## 2017-12-09 DIAGNOSIS — N2581 Secondary hyperparathyroidism of renal origin: Secondary | ICD-10-CM

## 2017-12-13 ENCOUNTER — Ambulatory Visit
Admission: RE | Admit: 2017-12-13 | Discharge: 2017-12-13 | Disposition: A | Discharge status: Home / Self Care | Payer: Medicare Other | Source: Ambulatory Visit | Attending: Nephrology | Admitting: Nephrology

## 2017-12-13 DIAGNOSIS — D631 Anemia in chronic kidney disease: Secondary | ICD-10-CM

## 2017-12-13 DIAGNOSIS — N2581 Secondary hyperparathyroidism of renal origin: Secondary | ICD-10-CM

## 2017-12-13 DIAGNOSIS — N189 Chronic kidney disease, unspecified: Secondary | ICD-10-CM

## 2017-12-13 DIAGNOSIS — N184 Chronic kidney disease, stage 4 (severe): Secondary | ICD-10-CM

## 2017-12-15 ENCOUNTER — Ambulatory Visit (INDEPENDENT_AMBULATORY_CARE_PROVIDER_SITE_OTHER): Payer: Medicare Other | Admitting: Internal Medicine

## 2017-12-15 ENCOUNTER — Encounter: Payer: Self-pay | Admitting: Internal Medicine

## 2017-12-15 ENCOUNTER — Ambulatory Visit
Admission: RE | Admit: 2017-12-15 | Discharge: 2017-12-15 | Disposition: A | Discharge status: Home / Self Care | Payer: Medicare Other | Source: Ambulatory Visit | Attending: Internal Medicine | Admitting: Internal Medicine

## 2017-12-15 VITALS — BP 132/80 | HR 68 | Ht 62.0 in | Wt 209.0 lb

## 2017-12-15 DIAGNOSIS — E049 Nontoxic goiter, unspecified: Secondary | ICD-10-CM | POA: Diagnosis not present

## 2017-12-15 DIAGNOSIS — E1159 Type 2 diabetes mellitus with other circulatory complications: Secondary | ICD-10-CM | POA: Diagnosis not present

## 2017-12-15 DIAGNOSIS — Z1231 Encounter for screening mammogram for malignant neoplasm of breast: Secondary | ICD-10-CM | POA: Diagnosis not present

## 2017-12-15 DIAGNOSIS — E785 Hyperlipidemia, unspecified: Secondary | ICD-10-CM | POA: Diagnosis not present

## 2017-12-15 NOTE — Progress Notes (Signed)
Patient ID: [name], female   DOB: [birth year], 67 y.o.   MRN: [id number]  HPI: [name] is a 67 y.o.-year-old female, returning for f/u DM2, dx in 2006, insulin-independent, controlled, with complications (CAD - s/p AMI 06/2013, CHF; CKD; PN; DR). Last visit 4 months ago.  Since last visit, she saw Dr. Hollie Salk (nephrology). She had labs done and renal U/S. She was started on a high potassium, low protein diet.  Last hemoglobin A1c was: 08/17/2017: HbA1c calculated from fructosamine: 7.2%, slightly improved. 04/15/2017: HbA1c calculated from the fructosamine is lower than measured, at 7.3% Lab Results  Component Value Date   HGBA1C 9.3 (H) 10/26/2017   HGBA1C 8.6 (H) 06/25/2017   HGBA1C 8.6 04/15/2017  08/21/2016: HbA1c 6.0%.  She has refused insulin repeatedly in the past. She started to change her diet after her hemoglobin A1c returned high, at 16.2% in 09/2014. She cut down portions, changed the meal content to include more fiber and less carbs.  HbA1c decreased dramatically.  Pt is on: - Glipizide ER 5 mg in a.m.-started 03/2015 She stopped metformin ER in 01/2017. She tried Tradjenta 5 mg daily in am >> CP with it (started 10/2014) >> had to stop - she did see her cardiologist since then She tried [place] >> decreased [name] and yeast inf. She tried regular metformin >> diarrhea  Pt checks her sugars 2x a day: - am:  103-130 >> 95, 105-130 >> 87-121, 137 - 2h after b'fast: 115-140 >> 115-140, 156 >> 102-130, 140 - before lunch:136 >> 145-156 >> 130-166 - 2h after lunch:132-140 >> n/c >> 128-150, 167 - before dinner: 118-157 >> 71, 85-144, 157 >> 116-154, 170 - 2h after dinner:125-152 >> 128-148 >> 119-162 - bedtime:  109-142 >> n/c >> 137 >> n/c - nighttime: n/c Lowest sugar was 80 >> 103 >> 71 >> 87;   it is unclear at which level she has hypoglycemia awareness. Highest sugar was  152 >> 160 >> 156 >> 270 - colonoscopy.  Glucometer: One Touch Ultra  Pt's  meals are: - Breakfast: oatmeal, cereals, [place] bacon, egg toast - Lunch: sandwich or fruit salad - Dinner: salmon/chicken, Brussel sprouts, other veggies, sweet potatoes or brown rice, spaghetti - Snacks: 2: carrots with dip; yoghurt with fruit, veggies + dip She walks for exercise.  - + CKD, last BUN/creatinine - referred to nephrology recently: Lab Results  Component Value Date   BUN 44 (H) 10/26/2017   CREATININE 2.42 (H) 10/26/2017  On Telmisartan. -+ HL; last set of lipids: Lab Results  Component Value Date   CHOL 119 08/17/2017   HDL 48.90 08/17/2017   LDLCALC 57 08/17/2017   TRIG 64.0 08/17/2017   CHOLHDL 2 08/17/2017  On Crestor 5 mg qod. - last eye exam was in 04/2017: + DR, + cataracts.  Had laser surgery. -+ Numbness and tingling in her feet. Has PN.   She also has a history of HTN, GERD, and anemia.  Patient also has a history of goiter, containing small cysts  Pt denies: - feeling nodules in neck - hoarseness - dysphagia - choking - SOB with lying down  Latest TSH normal: Lab Results  Component Value Date   TSH 1.63 06/25/2017    ROS: Constitutional: no weight gain/no weight loss, no fatigue, no subjective hyperthermia, no subjective hypothermia Eyes: no blurry vision, no xerophthalmia ENT: no sore throat, + see HPI Cardiovascular: no CP/no SOB/no palpitations/no leg swelling Respiratory: + cough/no SOB/no wheezing Gastrointestinal: no N/no V/no  D/no C/no acid reflux Musculoskeletal: + muscle aches/+ joint aches Skin: + rash, no hair loss Neurological: no tremors/+ numbness/+ tingling/no dizziness  I reviewed pt's medications, allergies, PMH, social hx, family hx, and changes were documented in the history of present illness. Otherwise, unchanged from my initial visit note. Started HCTZ.  Past Medical History:  Diagnosis Date  . Anemia   . COPD (chronic obstructive pulmonary disease) ([place])   . Diabetes mellitus   . Hyperlipidemia   .  Hypertension   . Ischemic cardiomyopathy 06/11/2013  . Low back pain   . NSTEMI (non-ST elevated myocardial infarction) ([place]) 06/11/2013  . Obesity   . Personal history of colon cancer   . Renal insufficiency    Past Surgical History:  Procedure Laterality Date  . COLECTOMY    . LEFT HEART CATHETERIZATION WITH CORONARY ANGIOGRAM N/A 06/09/2013   Procedure: LEFT HEART CATHETERIZATION WITH CORONARY ANGIOGRAM;  Surgeon: Ramond Dial, MD;  Location: [facility] CATH LAB;  Service: Cardiovascular;  Laterality: N/A;   History   Social History  . Marital Status: Single    Spouse Name: N/A  . Number of Children: 0   Occupational History  . retired   Social History Main Topics  . Smoking status:  former smoker     Types: Cigarettes  . Smokeless tobacco: Never Used  . Alcohol Use: No  . Drug Use: No   Current Outpatient Medications on File Prior to Visit  Medication Sig Dispense Refill  . amLODipine (NORVASC) 10 MG tablet Take 0.5 tablets (5 mg total) by mouth daily. 45 tablet 3  . aspirin 81 MG chewable tablet Chew 1 tablet (81 mg total) by mouth daily.    . calcium carbonate (OS-CAL) 600 MG TABS tablet Take 600 mg by mouth 2 (two) times daily with a meal.    . ferrous sulfate 325 (65 FE) MG tablet Take 1 tablet (325 mg total) by mouth 2 (two) times daily with a meal. 60 tablet 5  . fluticasone (FLONASE) 50 MCG/ACT nasal spray Place 2 sprays into both nostrils daily as needed for allergies or rhinitis.    . furosemide (LASIX) 80 MG tablet Take 0.5 tablets (40 mg total) by mouth daily. 45 tablet 3  . glipiZIDE (GLUCOTROL XL) 5 MG 24 hr tablet Take 1 tablet (5 mg total) by mouth daily with breakfast. 90 tablet 3  . hydrALAZINE (APRESOLINE) 25 MG tablet Take 1 tablet (25 mg total) by mouth 3 (three) times daily. 270 tablet 3  . hydrOXYzine (ATARAX/VISTARIL) 25 MG tablet Take 1-2 tablets (25-50 mg total) by mouth every 8 (eight) hours as needed for itching. 60 tablet 0  . loratadine (CLARITIN)  10 MG tablet Take 1 tablet (10 mg total) by mouth daily. 100 tablet 3  . nebivolol (BYSTOLIC) 10 MG tablet Take 1 tablet (10 mg total) by mouth daily. 90 tablet 3  . nitroGLYCERIN (NITROSTAT) 0.4 MG SL tablet DISSOLVE 1 TABLET UNDER THE TONGUE EVERY 5 MINUTES AS  NEEDED FOR CHEST PAIN 90 tablet 0  . ONE TOUCH ULTRA TEST test strip USE TO TEST BLOOD SUGAR TWO TIMES A DAY AS DIRECTED 100 each 11  . potassium chloride SA (K-DUR,KLOR-CON) 20 MEQ tablet Take 1 tablet (20 mEq total) by mouth daily. 90 tablet 3  . ranitidine (ZANTAC) 150 MG tablet Take 1 tablet (150 mg total) by mouth 2 (two) times daily. 60 tablet 11  . rosuvastatin (CRESTOR) 5 MG tablet TAKE 1 TABLET BY MOUTH  DAILY  AT 6PM 90 tablet 2  . telmisartan (MICARDIS) 40 MG tablet Take 40 mg by mouth daily.  3  . traMADol-acetaminophen (ULTRACET) 37.5-325 MG tablet Take 0.5-1 tablets by mouth every 8 (eight) hours as needed for severe pain. 100 tablet 3  . triamcinolone cream (KENALOG) 0.1 % Apply 1 application topically 4 (four) times daily. 450 g 3  . vitamin B-12 (CYANOCOBALAMIN) 1000 MCG tablet Take 1,000 mcg by mouth daily.     No current facility-administered medications on file prior to visit.    Allergies  Allergen Reactions  . Tradjenta [Linagliptin] Anaphylaxis    CP  . Atorvastatin     REACTION: aches and pains  . Enalapril Maleate     REACTION: cough  . Farxiga [Dapagliflozin] Itching  . Hydrochlorothiazide     REACTION: hair loss  . Kenalog [Triamcinolone Acetonide]     HANDS NUMB   . Metformin And Related     Diarrhea, dizziness  . Propoxyphene N-Acetaminophen Hives  . Simvastatin     REACTION: cramps  . Spironolactone     REACTION: cramps   Family History  Problem Relation Age of Onset  . Hypertension Unknown    PE: BP 132/80   Pulse 68   Ht 5\' 2"  (1.575 m)   Wt 209 lb (94.8 kg)   SpO2 99%   BMI 38.23 kg/m  Body mass index is 38.23 kg/m. Wt Readings from Last 3 Encounters:  12/15/17 209 lb (94.8  kg)  12/03/17 207 lb 12 oz (94.2 kg)  10/26/17 207 lb (93.9 kg)   Constitutional: overweight, in NAD Eyes: PERRLA, EOMI, no exophthalmos ENT: moist mucous membranes,  + symmetric  thyromegaly, no cervical lymphadenopathy Cardiovascular: RRR, No RG,+1/6 SEM, + BLE edema L>R Respiratory: CTA B Gastrointestinal: abdomen soft, NT, ND, BS+ Musculoskeletal: no deformities, strength intact in all 4 Skin: moist, warm, no rashes Neurological: no tremor with outstretched hands, DTR normal in all 4  ASSESSMENT: 1. DM2, insulin-independent, controlled, with complications - CAD, s/p AMI 06/2013 - Dr Acie Fredrickson - CHF - CKD - PN - DR - Dr. Jalene Mullet ([facility]) - on IO injections >> improving  2. Goiter - No neck compression symptoms  - 10/19/2014: thyroid ultrasound: Right thyroid lobe: 4.0 x 1.5 x 1.6 cm. Heterogeneous parenchyma with multiple small cysts identified. The largest measures approximately 0.5 x 0.3 x 0.5 cm. All of the small cysts in the right lobe appears simple and benign.  Left thyroid lobe: 4.5 x 1.4 x 2.0 cm. Heterogeneous parenchyma with multiple cysts. The largest measures approximately 1.0 x 0.4 x 0.5 cm. 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 Similar smaller cyst measures 0.8 cm in greatest diameter. There is a small solid nodule measuring 0.7 x 0.4 x 0.6 cm.  Isthmus Thickness: 0.4 cm. No nodules visualized.  Lymphadenopathy: None visualized.          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 No intervention needed.  3. HL  PLAN:  1. Patient with long-standing, uncontrolled diabetes, without a significant increase in blood sugars after she stopped metformin.  At last visit, since sugars were fairly well controlled, without significant CBG fluctuations, we continued only glipizide.  At this visit, her sugars are only may be slightly higher than before, but definitely not correlating with her very high HbA1c obtained recently, at 9.3%.   For her, fructosamine levels are more accurate to determine diabetes control.  We will check this today.   - For now, we can  continue only on glipizide XL - I suggested to:  Patient Instructions  Please continue: - Glipizide ER 5 mg before breakfast  Please return in 4 months with your sugar log.   - today will check a fructosamine - continue checking sugars at different times of the day - check 1x a day, rotating checks - advised for yearly eye exams >> she is UTD - Return to clinic in 3 mo with sugar log     2. Goiter -No neck compression symptoms -Per latest ultrasound from 10/2016: Only small thyroid cysts -Per review of the chart, latest TSH was normal 06/2017 -No further follow-up necessary unless she develops neck compression symptoms  3. HL - Reviewed latest lipid panel From 08/2017: All fractions at goal - Continues Crestor low-dose, 5 mg, every other day, in without side effects.  Office Visit on 12/15/2017  Component Date Value Ref Range Status  . Fructosamine 12/15/2017 327* 190 - 270 umol/L Final   HbA1c calculated from fructosamine: 7.17%, slightly improved from last visit  Philemon Kingdom, MD PhD [facility] End[facility]

## 2017-12-15 NOTE — Patient Instructions (Signed)
Please continue: - Glipizide ER 5 mg before breakfast  Please return in 4 months with your sugar log.  

## 2017-12-17 LAB — FRUCTOSAMINE: Fructosamine: 327 umol/L — ABNORMAL HIGH (ref 190–270)

## 2017-12-22 DIAGNOSIS — E113511 Type 2 diabetes mellitus with proliferative diabetic retinopathy with macular edema, right eye: Secondary | ICD-10-CM | POA: Diagnosis not present

## 2018-01-26 DIAGNOSIS — E113512 Type 2 diabetes mellitus with proliferative diabetic retinopathy with macular edema, left eye: Secondary | ICD-10-CM | POA: Diagnosis not present

## 2018-01-26 DIAGNOSIS — E113511 Type 2 diabetes mellitus with proliferative diabetic retinopathy with macular edema, right eye: Secondary | ICD-10-CM | POA: Diagnosis not present

## 2018-01-26 DIAGNOSIS — H2513 Age-related nuclear cataract, bilateral: Secondary | ICD-10-CM | POA: Diagnosis not present

## 2018-02-01 ENCOUNTER — Other Ambulatory Visit: Payer: Self-pay | Admitting: Internal Medicine

## 2018-02-26 ENCOUNTER — Other Ambulatory Visit: Payer: Self-pay | Admitting: Internal Medicine

## 2018-03-03 DIAGNOSIS — E113512 Type 2 diabetes mellitus with proliferative diabetic retinopathy with macular edema, left eye: Secondary | ICD-10-CM | POA: Diagnosis not present

## 2018-03-29 ENCOUNTER — Other Ambulatory Visit: Payer: Medicare Other

## 2018-03-29 ENCOUNTER — Encounter (INDEPENDENT_AMBULATORY_CARE_PROVIDER_SITE_OTHER): Payer: Self-pay

## 2018-03-29 DIAGNOSIS — E782 Mixed hyperlipidemia: Secondary | ICD-10-CM

## 2018-03-29 DIAGNOSIS — I5042 Chronic combined systolic (congestive) and diastolic (congestive) heart failure: Secondary | ICD-10-CM

## 2018-03-29 DIAGNOSIS — I10 Essential (primary) hypertension: Secondary | ICD-10-CM

## 2018-03-29 LAB — BASIC METABOLIC PANEL
BUN/Creatinine Ratio: 15 (ref 12–28)
BUN: 41 mg/dL — ABNORMAL HIGH (ref 8–27)
CO2: 16 mmol/L — ABNORMAL LOW (ref 20–29)
Calcium: 9.5 mg/dL (ref 8.7–10.3)
Chloride: 105 mmol/L (ref 96–106)
Creatinine, Ser: 2.73 mg/dL — ABNORMAL HIGH (ref 0.57–1.00)
GFR calc Af Amer: 20 mL/min/{1.73_m2} — ABNORMAL LOW (ref >59)
GFR calc non Af Amer: 17 mL/min/{1.73_m2} — ABNORMAL LOW (ref >59)
Glucose: 115 mg/dL — ABNORMAL HIGH (ref 65–99)
Potassium: 4.9 mmol/L (ref 3.5–5.2)
Sodium: 138 mmol/L (ref 134–144)

## 2018-03-29 LAB — HEPATIC FUNCTION PANEL
ALT: 10 IU/L (ref 0–32)
AST: 12 IU/L (ref 0–40)
Albumin: 4.2 g/dL (ref 3.6–4.8)
Alkaline Phosphatase: 134 IU/L — ABNORMAL HIGH (ref 39–117)
Bilirubin Total: 0.2 mg/dL (ref 0.0–1.2)
Bilirubin, Direct: 0.09 mg/dL (ref 0.00–0.40)
Total Protein: 6.6 g/dL (ref 6.0–8.5)

## 2018-03-29 LAB — LIPID PANEL
Chol/HDL Ratio: 2.8 ratio (ref 0.0–4.4)
Cholesterol, Total: 129 mg/dL (ref 100–199)
HDL: 46 mg/dL (ref >39)
LDL Calculated: 69 mg/dL (ref 0–99)
Triglycerides: 71 mg/dL (ref 0–149)
VLDL Cholesterol Cal: 14 mg/dL (ref 5–40)

## 2018-03-31 ENCOUNTER — Other Ambulatory Visit: Payer: Self-pay | Admitting: Internal Medicine

## 2018-03-31 ENCOUNTER — Telehealth: Payer: Self-pay | Admitting: Nurse Practitioner

## 2018-03-31 MED ORDER — HYDRALAZINE HCL 25 MG PO TABS: 25.0000 mg | ORAL_TABLET | Freq: Three times a day (TID) | ORAL | 3 refills | Status: DC

## 2018-03-31 NOTE — Telephone Encounter (Signed)
Reviewed lab results with patient. She states she has been coughing severely to the point of choking since starting Micardis. I advised that I will forward to Dr. Acie Fredrickson for review and call her back. She thanked me for the call.

## 2018-03-31 NOTE — Telephone Encounter (Signed)
-----   Message from Thayer Headings, MD sent at 03/30/2018  5:22 PM EDT ----- Creatinine has increased slightly. Our last med adjustment was to DC her ARB and start hydralazine. I do not think her slight increase is due to this  She should discuss with her primary MD

## 2018-03-31 NOTE — Telephone Encounter (Signed)
Pt has not been tolerating the MIcardis DC micarditis Increase hydralazine to 50 mg TID

## 2018-03-31 NOTE — Telephone Encounter (Signed)
Called patient and reviewed Dr. Elmarie Shiley advice with her. She states she did not start Hydralazine because her kidney doctor told her to take Micardis instead of Hydralazine. I advised her to start Hydralazine 25 mg TID and d/c Micardis. I advised her to monitor symptoms and BP after giving the Micardis time to get out of her system and to call back with questions or concerns. She verbalized understanding and agreement and thanked me for the call.

## 2018-04-20 ENCOUNTER — Ambulatory Visit (INDEPENDENT_AMBULATORY_CARE_PROVIDER_SITE_OTHER): Payer: Medicare Other | Admitting: Internal Medicine

## 2018-04-20 ENCOUNTER — Encounter: Payer: Self-pay | Admitting: Internal Medicine

## 2018-04-20 VITALS — BP 158/80 | HR 86 | Ht 62.0 in | Wt 202.0 lb

## 2018-04-20 DIAGNOSIS — E1159 Type 2 diabetes mellitus with other circulatory complications: Secondary | ICD-10-CM | POA: Diagnosis not present

## 2018-04-20 DIAGNOSIS — Z6837 Body mass index (BMI) 37.0-37.9, adult: Secondary | ICD-10-CM

## 2018-04-20 DIAGNOSIS — E785 Hyperlipidemia, unspecified: Secondary | ICD-10-CM

## 2018-04-20 DIAGNOSIS — E049 Nontoxic goiter, unspecified: Secondary | ICD-10-CM | POA: Diagnosis not present

## 2018-04-20 LAB — POCT GLYCOSYLATED HEMOGLOBIN (HGB A1C): Hemoglobin A1C: 8.1 % — AB (ref 4.0–5.6)

## 2018-04-20 NOTE — Progress Notes (Signed)
Patient ID: [name], female   DOB: 04/18/1951, 67 y.o.   MRN: [id number]  HPI: [name] is a 67 y.o.-year-old female, returning for f/u DM2, dx in 2006, insulin-independent, controlled, with complications (CAD - s/p AMI 06/2013, CHF; CKD; PN; DR). Last visit 4 months ago.  Before last visit, she saw Dr. Hollie [name] with nephrology.  She was started on a high potassium low protein diet.  Started Hydralazine. Stopped Micardis.  She does not have as much of an appetite now >> lost 7 lbs. Sugars started to improve in the last 2 weeks  Last hemoglobin A1c was: 12/15/2017: HbA1c calculated from fructosamine: 7.17%, slightly improved from last visit  08/17/2017: HbA1c calculated from fructosamine: 7.2%, slightly improved. 04/15/2017: HbA1c calculated from the fructosamine is lower than measured, at 7.3% Lab Results  Component Value Date   HGBA1C 9.3 (H) 10/26/2017   HGBA1C 8.6 (H) 06/25/2017   HGBA1C 8.6 04/15/2017  08/21/2016: HbA1c 6.0%.  She has refused insulin repeatedly in the past. She started to change her diet after her hemoglobin A1c returned high, at 16.2% in 09/2014. She cut down portions, changed the meal content to include more fiber and less carbs.  HbA1c decreased dramatically.  Pt is on: - Glipizide ER 5 mg in a.m.-started 03/2015. She stopped metformin ER in 01/2017. She tried Tradjenta 5 mg daily in am >> CP with it (started 10/2014) >> had to stop - she did see her cardiologist since then She tried [place] >> decreased [name] and yeast inf. She tried regular metformin >> diarrhea  Pt checks her sugars twice a day: - am:  95, 105-130 >> 87-121, 137 >> 97-133 - 2h after b'fast:  102-130, 140 >> 100-150 - before lunch: 145-156 >> 130-166 >> 131-143, 153 - 2h after lunch: n/c >> 128-150, 167 >> 133-155 - before dinner:71, 85-144, 157 >> 116-154, 170 >> 100-136 - 2h after dinner: 128-148 >> 119-162 >> 68-149 in last 2 weeks. - bedtime:   n/c >> 137 >> n/c -  nighttime: n/c Lowest sugar was 87 >> 68; it is unclear at which level she has hypoglycemia awareness. Highest sugar was  270 - colonoscopy >> 178.  Glucometer: One Touch Ultra  Pt's meals are: - Breakfast: oatmeal, cereals, [place] bacon, egg toast - Lunch: sandwich or fruit salad - Dinner: salmon/chicken, Brussel sprouts, other veggies, sweet potatoes or brown rice, spaghetti - Snacks: 2: carrots with dip; yoghurt with fruit, veggies + dip She walks for exercise.  -+ CKD, last BUN/creatinine - referred to nephrology recently: Lab Results  Component Value Date   BUN 41 (H) 03/29/2018   CREATININE 2.73 (H) 03/29/2018  She stopped telmisartan due to worsening kidney function and cough. -+ HL; last set of lipids: Lab Results  Component Value Date   CHOL 129 03/29/2018   HDL 46 03/29/2018   LDLCALC 69 03/29/2018   TRIG 71 03/29/2018   CHOLHDL 2.8 03/29/2018  On Crestor 5 every other day. - last eye exam was in 2019: + DR, + cataracts.  Had laser surgery. Dr. Posey [name]. -She has numbness and tingling in her feet.  She also has a history of HTN, GERD, and anemia.  She also has a history of goiter.  Latest thyroid ultrasound showed only small cysts, no nodules.  Pt denies: - feeling nodules in neck - hoarseness - dysphagia - choking - SOB with lying down  Latest TSH normal Lab Results  Component Value Date   TSH 1.63 06/25/2017  ROS: Constitutional: + weight loss, no fatigue, no subjective hyperthermia, + subjective hypothermia Eyes: no blurry vision, no xerophthalmia ENT: no sore throat, + see HPI Cardiovascular: no CP/no SOB/no palpitations/+ leg swelling Respiratory: no cough/no SOB/no wheezing Gastrointestinal: no N/no V/no D/+ C/no acid reflux Musculoskeletal: no muscle aches/+ joint aches Skin: + rash on L arm - improving, no hair loss Neurological: no tremors/+ numbness/+ tingling/no dizziness  I reviewed pt's medications, allergies, PMH, social hx, family hx,  and changes were documented in the history of present illness. Otherwise, unchanged from my initial visit note.  Past Medical History:  Diagnosis Date  . Anemia   . COPD (chronic obstructive pulmonary disease) ([place])   . Diabetes mellitus   . Hyperlipidemia   . Hypertension   . Ischemic cardiomyopathy 06/11/2013  . Low back pain   . NSTEMI (non-ST elevated myocardial infarction) ([place]) 06/11/2013  . Obesity   . Personal history of colon cancer   . Renal insufficiency    Past Surgical History:  Procedure Laterality Date  . COLECTOMY    . LEFT HEART CATHETERIZATION WITH CORONARY ANGIOGRAM N/A 06/09/2013   Procedure: LEFT HEART CATHETERIZATION WITH CORONARY ANGIOGRAM;  Surgeon: Ramond Dial, MD;  Location[facility] C[facility] LAB;  Service: Cardiovascular;  Laterality: N/A;   History   Social History  . Marital Status: Single    Spouse Name: N/A  . Number of Children: 0   Occupational History  . retired   Social History Main Topics  . Smoking status:  former smoker     Types: Cigarettes  . Smokeless tobacco: Never Used  . Alcohol Use: No  . Drug Use: No   Current Outpatient Medications on File Prior to Visit  Medication Sig Dispense Refill  . amLODipine (NORVASC) 10 MG tablet Take 0.5 tablets (5 mg total) by mouth daily. 45 tablet 3  . aspirin 81 MG chewable tablet Chew 1 tablet (81 mg total) by mouth daily.    [name] BYSTOLIC 10 MG tablet TAKE 1 TABLET BY MOUTH EVERY DAY 90 tablet 0  . calcium carbonate (OS-CAL) 600 MG TABS tablet Take 600 mg by mouth 2 (two) times daily with a meal.    . ferrous sulfate 325 (65 FE) MG tablet Take 1 tablet (325 mg total) by mouth 2 (two) times daily with a meal. 60 tablet 5  . fluticasone (FLONASE) 50 MCG/ACT nasal spray Place 2 sprays into both nostrils daily as needed for allergies or rhinitis.    . furosemide (LASIX) 80 MG tablet Take 0.5 tablets (40 mg total) by mouth daily. 45 tablet 3  . glipiZIDE (GLUCOTROL XL) 5 MG 24 hr tablet Take 1 tablet (5  mg total) by mouth daily with breakfast. 90 tablet 3  . hydrALAZINE (APRESOLINE) 25 MG tablet Take 1 tablet (25 mg total) by mouth 3 (three) times daily. 270 tablet 3  . hydrOXYzine (ATARAX/VISTARIL) 25 MG tablet Take 1-2 tablets (25-50 mg total) by mouth every 8 (eight) hours as needed for itching. 60 tablet 0  . loratadine (CLARITIN) 10 MG tablet Take 1 tablet (10 mg total) by mouth daily. 100 tablet 3  . nitroGLYCERIN (NITROSTAT) 0.4 MG SL tablet DISSOLVE 1 TABLET UNDER THE TONGUE EVERY 5 MINUTES AS  NEEDED FOR CHEST PAIN 90 tablet 0  . ONE TOUCH ULTRA TEST test strip USE TO TEST BLOOD SUGAR TWO TIMES A DAY AS DIRECTED 100 each 11  . potassium chloride SA (K-DUR,KLOR-CON) 20 MEQ tablet Take 1 tablet (20 mEq total)  by mouth daily. 90 tablet 3  . ranitidine (ZANTAC) 150 MG tablet TAKE 1 TABLET BY MOUTH TWICE A DAY 60 tablet 11  . rosuvastatin (CRESTOR) 5 MG tablet TAKE 1 TABLET BY MOUTH  DAILY AT 6PM 90 tablet 3  . traMADol-acetaminophen (ULTRACET) 37.5-325 MG tablet Take 0.5-1 tablets by mouth every 8 (eight) hours as needed for severe pain. 100 tablet 3  . triamcinolone cream (KENALOG) 0.1 % Apply 1 application topically 4 (four) times daily. 450 g 3  . vitamin B-12 (CYANOCOBALAMIN) 1000 MCG tablet Take 1,000 mcg by mouth daily.     No current facility-administered medications on file prior to visit.    Allergies  Allergen Reactions  . Tradjenta [Linagliptin] Anaphylaxis    CP  . Atorvastatin     REACTION: aches and pains  . Enalapril Maleate     REACTION: cough  . Farxiga [Dapagliflozin] Itching  . Hydrochlorothiazide     REACTION: hair loss  . Kenalog [Triamcinolone Acetonide]     HANDS NUMB   . Metformin And Related     Diarrhea, dizziness  . Propoxyphene N-Acetaminophen Hives  . Simvastatin     REACTION: cramps  . Spironolactone     REACTION: cramps   Family History  Problem Relation Age of Onset  . Hypertension Unknown    PE: BP (!) 158/80   Pulse 86   Ht 5\' 2"   (1.575 m)   Wt 202 lb (91.6 kg)   SpO2 96%   BMI 36.95 kg/m  Body mass index is 36.95 kg/m. Wt Readings from Last 3 Encounters:  04/20/18 202 lb (91.6 kg)  12/15/17 209 lb (94.8 kg)  12/03/17 207 lb 12 oz (94.2 kg)   Constitutional: overweight, in NAD Eyes: PERRLA, EOMI, no exophthalmos ENT: moist mucous membranes, + mild symmetric thyromegaly, no cervical lymphadenopathy Cardiovascular: RRR, No RG, +1/6 SEM, + L>R LE edema Respiratory: CTA B Gastrointestinal: abdomen soft, NT, ND, BS+ Musculoskeletal: no deformities, strength intact in all 4 Skin: moist, warm, no rashes Neurological: no tremor with outstretched hands, DTR normal in all 4  ASSESSMENT: 1. DM2, insulin-independent, controlled, with complications - CAD, s/p AMI 06/2013 - Dr Acie Fredrickson - CHF - CKD - PN - DR - Dr. Jalene Mullet ([facility]) - on IO injections >> improving  2. Goiter - No neck compression symptoms  - 10/19/2014: thyroid ultrasound: Right thyroid lobe: 4.0 x 1.5 x 1.6 cm. Heterogeneous parenchyma with multiple small cysts identified. The largest measures approximately 0.5 x 0.3 x 0.5 cm. All of the small cysts in the right lobe appears simple and benign.  Left thyroid lobe: 4.5 x 1.4 x 2.0 cm. Heterogeneous parenchyma with multiple cysts. The largest measures approximately 1.0 x 0.4 x 0.5 cm. 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 Similar smaller cyst measures 0.8 cm in greatest diameter. There is a small solid nodule measuring 0.7 x 0.4 x 0.6 cm.  Isthmus Thickness: 0.4 cm. No nodules visualized.  Lymphadenopathy: None visualized.          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 No intervention needed.  3. HL  4.  Obesity  PLAN:  1. Patient with long-standing, uncontrolled diabetes, without a significant increase in blood sugars after she stopped metformin.  We continued glipizide at last visit.  And HbA1c was 9.3%, however, in her case, fructosamine levels are  more accurate.  Indeed, HbA1c calculated from fructosamine obtained at last visit was much better, 7.1%, correlating better than the measured HbA1c with  her sugars at home.  We will recheck another fructosamine level today. -Her sugar started to significantly improve in the last 2 weeks after stopping my Cardizem and starting hydralazine 3 weeks ago.  She mentions that her appetite has decreased.  She had one slight low in the upper 60s at bedtime and we discussed that if she has more sugars in the 70s or lower, we will need to decrease the dose of her glipizide ER. -For now we can continue the same dose of glipizide ER - I suggested to:  Patient Instructions  Please continue: - Glipizide ER 5 mg before breakfast  Please return in 4 months with your sugar log.   - HbA1c 8.1% today (lower) but will also check a fructosamine level, as these are more accurate for her - continue checking sugars at different times of the day - check 1x a day, rotating checks - advised for yearly eye exams >> she is UTD - refuses flu shot today - Return to clinic in 4 mo with sugar log      2. Goiter -Denies neck compression symptoms. -Reviewed latest ultrasound report from 10/2016: Only small thyroid cysts -Reviewed latest TSH which was normal in 06/2017 -No further follow-up necessary unless she develops neck compression symptoms.  3. HL - Reviewed latest lipid panel from 03/2018: All fractions at goal Lab Results  Component Value Date   CHOL 129 03/29/2018   HDL 46 03/29/2018   LDLCALC 69 03/29/2018   TRIG 71 03/29/2018   CHOLHDL 2.8 03/29/2018  - Continues Crestor low-dose, 5 mg, every other day, without side effects.  4.  Obesity -+ lost weight since last visit 2/2 lack of appetite since last visit due to Hydralazine  Office Visit on 04/20/2018  Component Date Value Ref Range Status  . Fructosamine 04/20/2018 321* 190 - 270 umol/L Final  . Hemoglobin A1C 04/20/2018 8.1* 4.0 - 5.6 % Final    HbA1c calculated from fructosamine is slightly better than before, at 7%.  Philemon Kingdom, MD PhD [facility] End[facility]

## 2018-04-20 NOTE — Patient Instructions (Signed)
Please continue: - Glipizide ER 5 mg before breakfast  Please return in 4 months with your sugar log.

## 2018-04-24 LAB — FRUCTOSAMINE: Fructosamine: 321 umol/L — ABNORMAL HIGH (ref 190–270)

## 2018-04-26 ENCOUNTER — Telehealth: Payer: Self-pay

## 2018-04-26 NOTE — Telephone Encounter (Signed)
-----   Message from Philemon Kingdom, MD sent at 04/25/2018  8:06 AM EDT ----- Lenna Sciara, can you please call pt: HbA1c calculated from fructosamine is slightly better than before, at 7%.

## 2018-04-26 NOTE — Telephone Encounter (Signed)
Left message for patient to return our call at [phone].  

## 2018-04-27 ENCOUNTER — Encounter: Payer: Self-pay | Admitting: Internal Medicine

## 2018-04-27 ENCOUNTER — Ambulatory Visit (INDEPENDENT_AMBULATORY_CARE_PROVIDER_SITE_OTHER): Payer: Medicare Other | Admitting: Internal Medicine

## 2018-04-27 DIAGNOSIS — E1159 Type 2 diabetes mellitus with other circulatory complications: Secondary | ICD-10-CM | POA: Diagnosis not present

## 2018-04-27 DIAGNOSIS — D509 Iron deficiency anemia, unspecified: Secondary | ICD-10-CM | POA: Diagnosis not present

## 2018-04-27 DIAGNOSIS — L0292 Furuncle, unspecified: Secondary | ICD-10-CM

## 2018-04-27 DIAGNOSIS — I10 Essential (primary) hypertension: Secondary | ICD-10-CM

## 2018-04-27 DIAGNOSIS — I2510 Atherosclerotic heart disease of native coronary artery without angina pectoris: Secondary | ICD-10-CM | POA: Diagnosis not present

## 2018-04-27 DIAGNOSIS — I152 Hypertension secondary to endocrine disorders: Secondary | ICD-10-CM

## 2018-04-27 DIAGNOSIS — R600 Localized edema: Secondary | ICD-10-CM | POA: Diagnosis not present

## 2018-04-27 MED ORDER — TORSEMIDE 100 MG PO TABS: 50.0000 mg | ORAL_TABLET | Freq: Every day | ORAL | 11 refills | Status: DC

## 2018-04-27 MED ORDER — DOXYCYCLINE HYCLATE 100 MG PO TABS: 100.0000 mg | ORAL_TABLET | Freq: Two times a day (BID) | ORAL | 1 refills | Status: DC

## 2018-04-27 NOTE — Assessment & Plan Note (Signed)
Glipizide CBGs - good

## 2018-04-27 NOTE — Assessment & Plan Note (Signed)
Norvasc, Valsartan - d/c, Coreg Hydralazine, ASA

## 2018-04-27 NOTE — Progress Notes (Signed)
Subjective:  Patient ID: [name], female    DOB: 02-04-1951  Age: 67 y.o. MRN: [id number]  CC: No chief complaint on file.   HPI [name] presents for HTN, DM, dyslipidemia. CBGs are in 100 range C/o boils x 1 week C/o LLE swelling since April  Outpatient Medications Prior to Visit  Medication Sig Dispense Refill  . amLODipine (NORVASC) 10 MG tablet Take 0.5 tablets (5 mg total) by mouth daily. 45 tablet 3  . aspirin 81 MG chewable tablet Chew 1 tablet (81 mg total) by mouth daily.    [name] BYSTOLIC 10 MG tablet TAKE 1 TABLET BY MOUTH EVERY DAY 90 tablet 0  . calcium carbonate (OS-CAL) 600 MG TABS tablet Take 600 mg by mouth 2 (two) times daily with a meal.    . ferrous sulfate 325 (65 FE) MG tablet Take 1 tablet (325 mg total) by mouth 2 (two) times daily with a meal. 60 tablet 5  . fluticasone (FLONASE) 50 MCG/ACT nasal spray Place 2 sprays into both nostrils daily as needed for allergies or rhinitis.    . furosemide (LASIX) 80 MG tablet Take 0.5 tablets (40 mg total) by mouth daily. 45 tablet 3  . glipiZIDE (GLUCOTROL XL) 5 MG 24 hr tablet Take 1 tablet (5 mg total) by mouth daily with breakfast. 90 tablet 3  . hydrALAZINE (APRESOLINE) 25 MG tablet Take 1 tablet (25 mg total) by mouth 3 (three) times daily. 270 tablet 3  . hydrOXYzine (ATARAX/VISTARIL) 25 MG tablet Take 1-2 tablets (25-50 mg total) by mouth every 8 (eight) hours as needed for itching. 60 tablet 0  . loratadine (CLARITIN) 10 MG tablet Take 1 tablet (10 mg total) by mouth daily. 100 tablet 3  . nitroGLYCERIN (NITROSTAT) 0.4 MG SL tablet DISSOLVE 1 TABLET UNDER THE TONGUE EVERY 5 MINUTES AS  NEEDED FOR CHEST PAIN 90 tablet 0  . ONE TOUCH ULTRA TEST test strip USE TO TEST BLOOD SUGAR TWO TIMES A DAY AS DIRECTED 100 each 11  . potassium chloride SA (K-DUR,KLOR-CON) 20 MEQ tablet Take 1 tablet (20 mEq total) by mouth daily. 90 tablet 3  . ranitidine (ZANTAC) 150 MG tablet TAKE 1 TABLET BY MOUTH TWICE A DAY  60 tablet 11  . rosuvastatin (CRESTOR) 5 MG tablet TAKE 1 TABLET BY MOUTH  DAILY AT 6PM 90 tablet 3  . traMADol-acetaminophen (ULTRACET) 37.5-325 MG tablet Take 0.5-1 tablets by mouth every 8 (eight) hours as needed for severe pain. 100 tablet 3  . triamcinolone cream (KENALOG) 0.1 % Apply 1 application topically 4 (four) times daily. 450 g 3  . vitamin B-12 (CYANOCOBALAMIN) 1000 MCG tablet Take 1,000 mcg by mouth daily.     No facility-administered medications prior to visit.     ROS: Review of Systems  Constitutional: Positive for fatigue and unexpected weight change. Negative for activity change, appetite change and chills.  HENT: Negative for congestion, mouth sores and sinus pressure.   Eyes: Negative for visual disturbance.  Respiratory: Negative for cough and chest tightness.   Gastrointestinal: Negative for abdominal pain and nausea.  Genitourinary: Negative for difficulty urinating, frequency and vaginal pain.  Musculoskeletal: Negative for back pain and gait problem.  Skin: Positive for rash. Negative for pallor.  Neurological: Negative for dizziness, tremors, weakness, numbness and headaches.  Psychiatric/Behavioral: Negative for confusion, sleep disturbance and suicidal ideas.  boils  Objective:  BP 138/74 (BP Location: Left Arm, Patient Position: Sitting, Cuff Size: Large)   Pulse 76  Temp 98 F (36.7 C) (Oral)   Ht 5\' 2"  (1.575 m)   Wt 204 lb (92.5 kg)   SpO2 99%   BMI 37.31 kg/m   BP Readings from Last 3 Encounters:  04/27/18 138/74  04/20/18 (!) 158/80  12/15/17 132/80    Wt Readings from Last 3 Encounters:  04/27/18 204 lb (92.5 kg)  04/20/18 202 lb (91.6 kg)  12/15/17 209 lb (94.8 kg)    Physical Exam  Constitutional: She appears well-developed. No distress.  HENT:  Head: Normocephalic.  Right Ear: External ear normal.  Left Ear: External ear normal.  Nose: Nose normal.  Mouth/Throat: Oropharynx is clear and moist.  Eyes: Pupils are equal,  round, and reactive to light. Conjunctivae are normal. Right eye exhibits no discharge. Left eye exhibits no discharge.  Neck: Normal range of motion. Neck supple. No JVD present. No tracheal deviation present. No thyromegaly present.  Cardiovascular: Normal rate, regular rhythm and normal heart sounds.  Pulmonary/Chest: No stridor. No respiratory distress. She has no wheezes.  Abdominal: Soft. Bowel sounds are normal. She exhibits no distension and no mass. There is no tenderness. There is no rebound and no guarding.  Musculoskeletal: She exhibits no edema or tenderness.  Lymphadenopathy:    She has no cervical adenopathy.  Neurological: She displays normal reflexes. No cranial nerve deficit. She exhibits normal muscle tone. Coordination normal.  Skin: No rash noted. No erythema.  Psychiatric: She has a normal mood and affect. Her behavior is normal. Judgment and thought content normal.  boils RLE - trace LLE1+ edema, NT  Lab Results  Component Value Date   WBC 4.8 10/26/2017   HGB 10.0 (L) 10/26/2017   HCT 29.9 (L) 10/26/2017   PLT 146.0 (L) 10/26/2017   GLUCOSE 115 (H) 03/29/2018   CHOL 129 03/29/2018   TRIG 71 03/29/2018   HDL 46 03/29/2018   LDLCALC 69 03/29/2018   ALT 10 03/29/2018   AST 12 03/29/2018   NA 138 03/29/2018   K 4.9 03/29/2018   CL 105 03/29/2018   CREATININE 2.73 (H) 03/29/2018   BUN 41 (H) 03/29/2018   CO2 16 (L) 03/29/2018   TSH 1.63 06/25/2017   INR 1.16 06/07/2013   HGBA1C 8.1 (A) 04/20/2018   MICROALBUR 13.9 (H) 10/13/2007    Mm Digital Screening Bilateral  Result Date: 12/15/2017 CLINICAL DATA:  Screening. EXAM: DIGITAL SCREENING BILATERAL MAMMOGRAM WITH CAD COMPARISON:  None. ACR Breast Density Category b: There are scattered areas of fibroglandular density. FINDINGS: There are no findings suspicious for malignancy. Images were processed with CAD. IMPRESSION: No mammographic evidence of malignancy. A result letter of this screening mammogram will  be mailed directly to the patient. RECOMMENDATION: Screening mammogram in one year. (Code:SM-B-01Y) BI-RADS CATEGORY  1: Negative. Electronically Signed   By: Lajean Manes M.D.   On: 12/15/2017 12:30    Assessment & Plan:   There are no diagnoses linked to this encounter.   No orders of the defined types were placed in this encounter.    Follow-up: No follow-ups on file.  Walker Kehr, MD

## 2018-04-27 NOTE — Assessment & Plan Note (Signed)
Doxy x 10 d 

## 2018-04-27 NOTE — Assessment & Plan Note (Signed)
Norvasc, Valsartan - d/c, Coreg Hydralazine

## 2018-04-27 NOTE — Assessment & Plan Note (Signed)
Labs

## 2018-04-27 NOTE — Assessment & Plan Note (Addendum)
  Elevate legs Compr socks 8/18 [place] - no DVT  D/c Lasix; start Demadex

## 2018-05-02 NOTE — Telephone Encounter (Signed)
Result letter sent.

## 2018-05-11 ENCOUNTER — Ambulatory Visit (INDEPENDENT_AMBULATORY_CARE_PROVIDER_SITE_OTHER): Payer: Medicare Other | Admitting: Cardiovascular Disease

## 2018-05-11 ENCOUNTER — Encounter: Payer: Self-pay | Admitting: Cardiovascular Disease

## 2018-05-11 VITALS — BP 132/62 | HR 87 | Ht 62.0 in | Wt 199.0 lb

## 2018-05-11 DIAGNOSIS — I5042 Chronic combined systolic (congestive) and diastolic (congestive) heart failure: Secondary | ICD-10-CM

## 2018-05-11 DIAGNOSIS — I110 Hypertensive heart disease with heart failure: Secondary | ICD-10-CM | POA: Diagnosis not present

## 2018-05-11 NOTE — Progress Notes (Signed)
Cardiology Office Note   Date:  05/11/2018   ID:  [name], DOB 05-23-1951, MRN [id number]  PCP:  [name], MD  Cardiologist:   Mertie Moores, MD   Chief Complaint  Patient presents with  . Congestive Heart Failure   1. Coronary artery disease-status post PTCA and stenting of her mid LAD using a 3.5 x 15 mm Alpine stent ( DES) ( Dec. 5, 2014)  2. chronic systolic congestive heart failure-ejection fraction of 35% 2. Hypertension 3. COPD 4. Diabetes mellitus 5. History colon cancer    [name] is seen today. She was initially seen by Dr. Mare Ferrari in the hospital. She has a history of chronic systolic congestive heart failure with an ejection fraction of around 35%. She has a history of coronary artery disease. She is status post PTCA and stenting of her mid left anterior descending artery using a 3.5 x 15 mm Alpine stent. She has a history of COPD. [name] not had a cigarette since he left the hospital. She also has a history of diabetes mellitus and history of colon cancer  She has mild interscapular pain this morning . And the pain resolved after she drank some club soda. Did not feel like her MI pain.  She has been walking every morining at the mall and at [place].   Her BP is up today. - she may have had lots of salt last night. The BP has remained high   September 20, 2013  [name] has been having some episodes of chest discomfort. She initially thought it might be due to indigestion or due to something that she ate. It radiated to her interscapular region. It was associated with some hand and normal tingling. The pain was intermittent but lasted for several hours. She took a sublingual nitroglycerin and the pain resolved very quickly and she did not have any further episodes the whole rest of the day.  She informed that she ran out of her Lasix 3 days ago.  November 01, 2013:  [name] is feeling well. She stopped smoking the day of her cath. She  is eating more "penny candy" to replace the habit.   January 31, 2014:  She is doing ok. She has no angina. Not exercising. 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  She has a bruise on her leg where she bumped into a table.   Oct. 29, 2015:  [name] is doing very well. She is not having any episodes of chest pain. [name] not exercising quite as much as she would like. Stent was placed in December of 2014. She had a nose bleed last week. ( she is on Effient until Dec. 2015). Her Bp was A bit elevated because of some salt in her diet.  No CP or dyspnea    November 02, 2014:  [name] is a 67 y.o. female who presents for follow-up of her coronary artery disease. [name] done fairly well. She has had some muscle pain and a rash. She has occasional episodes of dizziness. She had some non-cardiac CP several weeks ago when she started Trajenta.  Stopped the med and has not had any further episodes of CP.    Oct. 26, 2016  [name] is doing well She had a echo recently   Left ventricle: The cavity size was normal. Wall thickness was increased in a pattern of mild LVH with focal basal septal hypertrophy. Systolic function was normal. The estimated ejection fraction was in the range of  60% to 65%. Wall motion was normal; there were no regional wall motion abnormalities. Doppler parameters are consistent with abnormal left ventricular relaxation (grade 1 diastolic dysfunction). - Aortic valve: There was no stenosis. There was trivial regurgitation. - Mitral valve: Mildly calcified annulus. Mildly calcified leaflets . There was no significant regurgitation. - Right ventricle: The cavity size was normal. Systolic function was normal. - Tricuspid valve: Peak RV-RA gradient (S): 21 mm Hg. - Pulmonary arteries: PA peak pressure: 24 mm Hg (S). - Inferior vena cava: The vessel was normal in size. The respirophasic diameter changes were in the normal range (>=  50%), consistent with normal central venous pressure  [name] is doing well  BP has been elevated for the past 2 weeks.  Has had some left arm pain / ached Left arm aches all the time,  Constant.  Worse with sitting, not worsened by exercise .   October 28, 2015:  [name] is doing well.  She went with [facility] - it took 2 weeks for them to get her meds to her .  Has chronic left arm pain .  Oct. 23, 2017:  Doing well from a cardiac standpoint Having eye surgery . Has had a chronic cough [name], [name] Lacks, MD changed her Coreg to [facility] Did not seem to help the cough   Jan. 24, 2018:  doing well. Still eating some salty foods.  Still smoking some,  Advised her to quit.  Echo from 2016 shows normal LV systolic function Needs to have an contrast imaging study of her eye. She is at low risk for that procedure.   Aug. 17, 2018:  [name] is seen back today for follow up of her CHF   Dec 03, 2017:    [name] is seen back for follow up of her CHF and HTN Doing well. EF is now normal.   Gets some exercise - not as much as she would like  Has some ankle swelling  Has had some arm pain that she associated with the Losartan ( MSK pain is mentioned in [place])   Nov. 6, 2019:  [name] is seen for follow up visit  BP is well controlled  Had an episodes of CP while very upset ( her brother had just had a aortic dissection ( 10.5 hours of surgery )  Brother is 73 yo .    No further episodes of CP after the stress  Has a rash on the left side of the chest  She was not tolerating the micardis ( cough)  She started taking hydralazine on September 26.  She now has developed a rash on the left side of her chest.  She was started on torsemide instead of furosemide by her primary medical doctor.  She does okay when she takes the 50 mg of torsemide.  When she takes 100 mg of torsemide she has severe muscle cramping.    Past Medical History:  Diagnosis Date  . Anemia   . COPD  (chronic obstructive pulmonary disease) ([place])   . Diabetes mellitus   . Hyperlipidemia   . Hypertension   . Ischemic cardiomyopathy 06/11/2013  . Low back pain   . NSTEMI (non-ST elevated myocardial infarction) ([place]) 06/11/2013  . Obesity   . Personal history of colon cancer   . Renal insufficiency     Past Surgical History:  Procedure Laterality Date  . COLECTOMY    . LEFT HEART CATHETERIZATION WITH CORONARY ANGIOGRAM N/A 06/09/2013   Procedure: LEFT HEART CATHETERIZATION  WITH CORONARY ANGIOGRAM;  Surgeon: Ramond Dial, MD;  [facility] C[facility] LAB;  Service: Cardiovascular;  Laterality: N/A;     Current Outpatient Medications  Medication Sig Dispense Refill  . amLODipine (NORVASC) 10 MG tablet Take 0.5 tablets (5 mg total) by mouth daily. 45 tablet 3  . aspirin 81 MG chewable tablet Chew 1 tablet (81 mg total) by mouth daily.    [name] BYSTOLIC 10 MG tablet TAKE 1 TABLET BY MOUTH EVERY DAY 90 tablet 0  . calcium carbonate (OS-CAL) 600 MG TABS tablet Take 600 mg by mouth 2 (two) times daily with a meal.    . doxycycline (VIBRA-TABS) 100 MG tablet Take 1 tablet (100 mg total) by mouth 2 (two) times daily. 28 tablet 1  . fluticasone (FLONASE) 50 MCG/ACT nasal spray Place 2 sprays into both nostrils daily as needed for allergies or rhinitis.    [name] glipiZIDE (GLUCOTROL XL) 5 MG 24 hr tablet Take 1 tablet (5 mg total) by mouth daily with breakfast. 90 tablet 3  . hydrALAZINE (APRESOLINE) 25 MG tablet Take 1 tablet (25 mg total) by mouth 3 (three) times daily. 270 tablet 3  . hydrOXYzine (ATARAX/VISTARIL) 25 MG tablet Take 1-2 tablets (25-50 mg total) by mouth every 8 (eight) hours as needed for itching. 60 tablet 0  . loratadine (CLARITIN) 10 MG tablet Take 1 tablet (10 mg total) by mouth daily. 100 tablet 3  . nitroGLYCERIN (NITROSTAT) 0.4 MG SL tablet DISSOLVE 1 TABLET UNDER THE TONGUE EVERY 5 MINUTES AS  NEEDED FOR CHEST PAIN 90 tablet 0  . ONE TOUCH ULTRA TEST test strip USE TO TEST BLOOD  SUGAR TWO TIMES A DAY AS DIRECTED 100 each 11  . potassium chloride SA (K-DUR,KLOR-CON) 20 MEQ tablet Take 1 tablet (20 mEq total) by mouth daily. 90 tablet 3  . ranitidine (ZANTAC) 150 MG tablet TAKE 1 TABLET BY MOUTH TWICE A DAY 60 tablet 11  . rosuvastatin (CRESTOR) 5 MG tablet TAKE 1 TABLET BY MOUTH  DAILY AT 6PM 90 tablet 3  . torsemide (DEMADEX) 100 MG tablet Take 0.5-1 tablets (50-100 mg total) by mouth daily. 30 tablet 11  . traMADol-acetaminophen (ULTRACET) 37.5-325 MG tablet Take 0.5-1 tablets by mouth every 8 (eight) hours as needed for severe pain. 100 tablet 3  . triamcinolone cream (KENALOG) 0.1 % Apply 1 application topically 4 (four) times daily. 450 g 3  . vitamin B-12 (CYANOCOBALAMIN) 1000 MCG tablet Take 1,000 mcg by mouth daily.     No current facility-administered medications for this visit.     Allergies:   Tradjenta [linagliptin]; Atorvastatin; Enalapril maleate; Farxiga [dapagliflozin]; Hydrochlorothiazide; Kenalog [triamcinolone acetonide]; Metformin and related; Propoxyphene n-acetaminophen; Simvastatin; and Spironolactone    Social History:  The patient  reports that she has been smoking cigarettes. She has never used smokeless tobacco. She reports that she does not drink alcohol or use drugs.   Family History:  The patient's family history includes Hypertension in her unknown relative.    ROS:   Noted in current hx , otherwise negative   Physical Exam: Blood pressure 132/62, pulse 87, height 5\' 2"  (1.575 m), weight 199 lb (90.3 kg), SpO2 99 %.  GEN:  Middle age female  HEENT: Normal NECK: No JVD; No carotid bruits LYMPHATICS: No lymphadenopathy CARDIAC: RRR , soft systolic murmur  RESPIRATORY:  Clear to auscultation without rales, wheezing or rhonchi  ABDOMEN: Soft, non-tender, non-distended MUSCULOSKELETAL:  No edema; No deformity  SKIN: Warm and dry NEUROLOGIC:  Alert and oriented x 3  EKG:      Recent Labs: 06/25/2017: TSH 1.63 10/26/2017:  Hemoglobin 10.0; Platelets 146.0 03/29/2018: ALT 10; BUN 41; Creatinine, Ser 2.73; Potassium 4.9; Sodium 138    Lipid Panel    Component Value Date/Time   CHOL 129 03/29/2018 1042   TRIG 71 03/29/2018 1042   HDL 46 03/29/2018 1042   CHOLHDL 2.8 03/29/2018 1042   CHOLHDL 2 08/17/2017 1124   VLDL 12.8 08/17/2017 1124   LDLCALC 69 03/29/2018 1042      Wt Readings from Last 3 Encounters:  05/11/18 199 lb (90.3 kg)  04/27/18 204 lb (92.5 kg)  04/20/18 202 lb (91.6 kg)      Other studies Reviewed: Additional studies/ records that were reviewed today include: . Review of the above records demonstrates:    ASSESSMENT AND PLAN:  1. Coronary artery disease-status post PTCA and stenting of her mid LAD using a 3.5 x 15 mm Alpine stent ( DES) ( Dec. 5, 2014)  Not having any episodes of angina.  Continue current medications.    2. chronic systolic congestive heart failure- initial ejection fraction of 35% -  Echo from 2016 looks good.  Her LV function is normalized.    3.  Hypertensive heart disease with congestive heart failure  -blood pressure looks great after changing from furosemide to torsemide and starting hydralazine.  Continue current medications.  I will see her again in 6 months.  .  4.  Left leg edema:   ? Due to amlodipine  - needs to reduce salt intake   4. Diabetes mellitus 5. History colon cancer 6. COPD - encouraged smoking cessation   Current medicines are reviewed at length with the patient today.  The patient does not have concerns regarding medicines.    Labs/ tests ordered today include:   No orders of the defined types were placed in this encounter.       Mertie Moores, MD  05/11/2018 6:15 PM    [facility] [address], [place], [place]  [address] Phone: [phone]; Fax: [phone]

## 2018-05-11 NOTE — Patient Instructions (Signed)

## 2018-05-26 DIAGNOSIS — D631 Anemia in chronic kidney disease: Secondary | ICD-10-CM | POA: Diagnosis not present

## 2018-05-26 DIAGNOSIS — N184 Chronic kidney disease, stage 4 (severe): Secondary | ICD-10-CM | POA: Diagnosis not present

## 2018-05-26 DIAGNOSIS — N2581 Secondary hyperparathyroidism of renal origin: Secondary | ICD-10-CM | POA: Diagnosis not present

## 2018-05-26 DIAGNOSIS — N189 Chronic kidney disease, unspecified: Secondary | ICD-10-CM | POA: Diagnosis not present

## 2018-05-26 DIAGNOSIS — I129 Hypertensive chronic kidney disease with stage 1 through stage 4 chronic kidney disease, or unspecified chronic kidney disease: Secondary | ICD-10-CM | POA: Diagnosis not present

## 2018-05-26 DIAGNOSIS — E1122 Type 2 diabetes mellitus with diabetic chronic kidney disease: Secondary | ICD-10-CM | POA: Diagnosis not present

## 2018-06-07 ENCOUNTER — Telehealth: Payer: Self-pay

## 2018-06-07 NOTE — Telephone Encounter (Signed)
Noted. Thx.

## 2018-06-07 NOTE — Telephone Encounter (Signed)
FYI  Copied from [place] [phone]. Topic: General - Other >> Jun 01, 2018 12:19 PM Keene Breath wrote: Reason for CRM: Patient called to inform the doctor that she received a call from the [facility] stating that they have scheduled her for a IV Blood Infusion on 06/09/18.  Patient wanted the nurse or doctor to know and to call her back if there are any concerns.  CB# [phone]

## 2018-06-08 ENCOUNTER — Other Ambulatory Visit (HOSPITAL_COMMUNITY): Payer: Self-pay

## 2018-06-09 ENCOUNTER — Ambulatory Visit (HOSPITAL_COMMUNITY)
Admission: RE | Admit: 2018-06-09 | Discharge: 2018-06-09 | Disposition: A | Discharge status: Home / Self Care | Payer: Medicare Other | Source: Ambulatory Visit | Attending: Nephrology | Admitting: Nephrology

## 2018-06-09 DIAGNOSIS — N189 Chronic kidney disease, unspecified: Secondary | ICD-10-CM | POA: Insufficient documentation

## 2018-06-09 DIAGNOSIS — D631 Anemia in chronic kidney disease: Secondary | ICD-10-CM | POA: Insufficient documentation

## 2018-06-09 MED ORDER — SODIUM CHLORIDE 0.9 % IV SOLN
510.0000 mg | INTRAVENOUS | Status: DC
  Administered 2018-06-09: 510 mg via INTRAVENOUS
  Filled 2018-06-09: qty 510

## 2018-06-09 NOTE — Discharge Instructions (Signed)

## 2018-06-16 ENCOUNTER — Ambulatory Visit (HOSPITAL_COMMUNITY)
Admission: RE | Admit: 2018-06-16 | Discharge: 2018-06-16 | Disposition: A | Discharge status: Home / Self Care | Payer: Medicare Other | Source: Ambulatory Visit | Attending: Nephrology | Admitting: Nephrology

## 2018-06-16 DIAGNOSIS — D631 Anemia in chronic kidney disease: Secondary | ICD-10-CM | POA: Diagnosis not present

## 2018-06-16 DIAGNOSIS — N189 Chronic kidney disease, unspecified: Secondary | ICD-10-CM | POA: Diagnosis not present

## 2018-06-16 MED ORDER — SODIUM CHLORIDE 0.9 % IV SOLN
510.0000 mg | INTRAVENOUS | Status: AC
  Administered 2018-06-16: 510 mg via INTRAVENOUS
  Filled 2018-06-16: qty 17

## 2018-06-23 ENCOUNTER — Other Ambulatory Visit: Payer: Self-pay | Admitting: Internal Medicine

## 2018-08-03 ENCOUNTER — Encounter: Payer: Self-pay | Admitting: Internal Medicine

## 2018-08-03 ENCOUNTER — Ambulatory Visit (INDEPENDENT_AMBULATORY_CARE_PROVIDER_SITE_OTHER): Payer: Medicare Other | Admitting: Internal Medicine

## 2018-08-03 VITALS — BP 128/72 | HR 74 | Temp 98.4°F | Ht 62.0 in | Wt 194.0 lb

## 2018-08-03 DIAGNOSIS — J449 Chronic obstructive pulmonary disease, unspecified: Secondary | ICD-10-CM

## 2018-08-03 DIAGNOSIS — D509 Iron deficiency anemia, unspecified: Secondary | ICD-10-CM

## 2018-08-03 DIAGNOSIS — E1122 Type 2 diabetes mellitus with diabetic chronic kidney disease: Secondary | ICD-10-CM

## 2018-08-03 DIAGNOSIS — I2510 Atherosclerotic heart disease of native coronary artery without angina pectoris: Secondary | ICD-10-CM

## 2018-08-03 DIAGNOSIS — E1159 Type 2 diabetes mellitus with other circulatory complications: Secondary | ICD-10-CM | POA: Diagnosis not present

## 2018-08-03 DIAGNOSIS — N184 Chronic kidney disease, stage 4 (severe): Secondary | ICD-10-CM

## 2018-08-03 DIAGNOSIS — Z6837 Body mass index (BMI) 37.0-37.9, adult: Secondary | ICD-10-CM

## 2018-08-03 DIAGNOSIS — N898 Other specified noninflammatory disorders of vagina: Secondary | ICD-10-CM

## 2018-08-03 MED ORDER — AMOXICILLIN-POT CLAVULANATE 875-125 MG PO TABS: 1.0000 | ORAL_TABLET | Freq: Two times a day (BID) | ORAL | 0 refills | Status: DC

## 2018-08-03 MED ORDER — HYDROCODONE-HOMATROPINE 5-1.5 MG/5ML PO SYRP: 5.0000 mL | ORAL_SOLUTION | Freq: Three times a day (TID) | ORAL | 0 refills | Status: DC | PRN

## 2018-08-03 NOTE — Assessment & Plan Note (Signed)
Iron infusions

## 2018-08-03 NOTE — Assessment & Plan Note (Signed)
Wt Readings from Last 3 Encounters:  08/03/18 194 lb (88 kg)  06/16/18 195 lb (88.5 kg)  05/11/18 199 lb (90.3 kg)

## 2018-08-03 NOTE — Assessment & Plan Note (Signed)
On Glipizide 

## 2018-08-03 NOTE — Patient Instructions (Addendum)
Air vent deflectors   You can use over-the-counter  "cold" medicines  such as "Afrin" nasal spray for nasal congestion as directed. Use " Delsym" or" Robitussin" cough syrup varietis for cough.  You can use plain "Tylenol"  for fever, chills and achyness. Use Halls or Ricola cough drops.    Please, make an appointment if you are not better or if you're worse.   If you have medicare related insurance (such as traditional Medicare, [employer], [employer], or similar), Please make an appointment at the scheduling desk with Sharee Pimple, the [employer], for your Wellness visit in this office, which is a benefit with your insurance.

## 2018-08-03 NOTE — Assessment & Plan Note (Signed)
Aspirin, Crestor 

## 2018-08-03 NOTE — Assessment & Plan Note (Signed)
Augmentin GYN ref

## 2018-08-03 NOTE — Assessment & Plan Note (Signed)
No wheezing

## 2018-08-03 NOTE — Assessment & Plan Note (Signed)
Anemia treatment per Nephrology

## 2018-08-03 NOTE — Progress Notes (Signed)
Subjective:  Patient ID: [name], female    DOB: 1951-06-09  Age: 68 y.o. MRN: [id number]  CC: No chief complaint on file.   HPI [name] presents for HTN, DM, CAD f/u C/o sinusitis sx's x 6 weeks  Outpatient Medications Prior to Visit  Medication Sig Dispense Refill  . amLODipine (NORVASC) 10 MG tablet Take 0.5 tablets (5 mg total) by mouth daily. 45 tablet 3  . aspirin 81 MG chewable tablet Chew 1 tablet (81 mg total) by mouth daily.    [name] BYSTOLIC 10 MG tablet TAKE 1 TABLET BY MOUTH EVERY DAY 90 tablet 1  . calcium carbonate (OS-CAL) 600 MG TABS tablet Take 600 mg by mouth 2 (two) times daily with a meal.    . doxycycline (VIBRA-TABS) 100 MG tablet Take 1 tablet (100 mg total) by mouth 2 (two) times daily. 28 tablet 1  . fluticasone (FLONASE) 50 MCG/ACT nasal spray Place 2 sprays into both nostrils daily as needed for allergies or rhinitis.    [name] glipiZIDE (GLUCOTROL XL) 5 MG 24 hr tablet Take 1 tablet (5 mg total) by mouth daily with breakfast. 90 tablet 3  . hydrALAZINE (APRESOLINE) 25 MG tablet Take 1 tablet (25 mg total) by mouth 3 (three) times daily. 270 tablet 3  . hydrOXYzine (ATARAX/VISTARIL) 25 MG tablet Take 1-2 tablets (25-50 mg total) by mouth every 8 (eight) hours as needed for itching. 60 tablet 0  . loratadine (CLARITIN) 10 MG tablet Take 1 tablet (10 mg total) by mouth daily. 100 tablet 3  . nitroGLYCERIN (NITROSTAT) 0.4 MG SL tablet DISSOLVE 1 TABLET UNDER THE TONGUE EVERY 5 MINUTES AS  NEEDED FOR CHEST PAIN 90 tablet 0  . ONE TOUCH ULTRA TEST test strip USE TO TEST BLOOD SUGAR TWO TIMES A DAY AS DIRECTED 100 each 11  . potassium chloride SA (K-DUR,KLOR-CON) 20 MEQ tablet Take 1 tablet (20 mEq total) by mouth daily. 90 tablet 3  . ranitidine (ZANTAC) 150 MG tablet TAKE 1 TABLET BY MOUTH TWICE A DAY 60 tablet 11  . rosuvastatin (CRESTOR) 5 MG tablet TAKE 1 TABLET BY MOUTH  DAILY AT 6PM 90 tablet 3  . torsemide (DEMADEX) 100 MG tablet Take 0.5-1  tablets (50-100 mg total) by mouth daily. 30 tablet 11  . traMADol-acetaminophen (ULTRACET) 37.5-325 MG tablet Take 0.5-1 tablets by mouth every 8 (eight) hours as needed for severe pain. 100 tablet 3  . triamcinolone cream (KENALOG) 0.1 % Apply 1 application topically 4 (four) times daily. 450 g 3  . vitamin B-12 (CYANOCOBALAMIN) 1000 MCG tablet Take 1,000 mcg by mouth daily.     No facility-administered medications prior to visit.     ROS: Review of Systems  Constitutional: Positive for fatigue. Negative for activity change, appetite change, chills and unexpected weight change.  HENT: Positive for congestion, postnasal drip, rhinorrhea and sore throat. Negative for dental problem, mouth sores and sinus pressure.   Eyes: Negative for visual disturbance.  Respiratory: Positive for cough. Negative for chest tightness.   Gastrointestinal: Negative for abdominal pain and nausea.  Genitourinary: Negative for difficulty urinating, frequency and vaginal pain.  Musculoskeletal: Negative for back pain and gait problem.  Skin: Negative for pallor and rash.  Neurological: Negative for dizziness, tremors, weakness, numbness and headaches.  Psychiatric/Behavioral: Negative for confusion and sleep disturbance.    Objective:  BP 128/72 (BP Location: Left Arm, Patient Position: Sitting, Cuff Size: Large)   Pulse 74   Temp 98.4 F (36.9  C) (Oral)   Ht 5\' 2"  (1.575 m)   Wt 194 lb (88 kg)   SpO2 99%   BMI 35.48 kg/m   BP Readings from Last 3 Encounters:  08/03/18 128/72  06/16/18 (!) 142/61  06/09/18 (!) 143/57    Wt Readings from Last 3 Encounters:  08/03/18 194 lb (88 kg)  06/16/18 195 lb (88.5 kg)  05/11/18 199 lb (90.3 kg)    Physical Exam Constitutional:      General: She is not in acute distress.    Appearance: She is well-developed.  HENT:     Head: Normocephalic.     Right Ear: External ear normal.     Left Ear: External ear normal.     Nose: Nose normal.     Mouth/Throat:      Pharynx: Posterior oropharyngeal erythema present.  Eyes:     General:        Right eye: No discharge.        Left eye: No discharge.     Conjunctiva/sclera: Conjunctivae normal.     Pupils: Pupils are equal, round, and reactive to light.  Neck:     Musculoskeletal: Normal range of motion and neck supple.     Thyroid: No thyromegaly.     Vascular: No JVD.     Trachea: No tracheal deviation.  Cardiovascular:     Rate and Rhythm: Normal rate and regular rhythm.     Heart sounds: Normal heart sounds.  Pulmonary:     Effort: No respiratory distress.     Breath sounds: No stridor. No wheezing.  Abdominal:     General: Bowel sounds are normal. There is no distension.     Palpations: Abdomen is soft. There is no mass.     Tenderness: There is no abdominal tenderness. There is no guarding or rebound.  Musculoskeletal:        General: No tenderness.  Lymphadenopathy:     Cervical: No cervical adenopathy.  Skin:    Findings: No erythema or rash.  Neurological:     Cranial Nerves: No cranial nerve deficit.     Motor: No abnormal muscle tone.     Coordination: Coordination normal.     Deep Tendon Reflexes: Reflexes normal.  Psychiatric:        Behavior: Behavior normal.        Thought Content: Thought content normal.        Judgment: Judgment normal.    Obese LS tender Pt declined vaginal exam  Lab Results  Component Value Date   WBC 4.8 10/26/2017   HGB 10.0 (L) 10/26/2017   HCT 29.9 (L) 10/26/2017   PLT 146.0 (L) 10/26/2017   GLUCOSE 115 (H) 03/29/2018   CHOL 129 03/29/2018   TRIG 71 03/29/2018   HDL 46 03/29/2018   LDLCALC 69 03/29/2018   ALT 10 03/29/2018   AST 12 03/29/2018   NA 138 03/29/2018   K 4.9 03/29/2018   CL 105 03/29/2018   CREATININE 2.73 (H) 03/29/2018   BUN 41 (H) 03/29/2018   CO2 16 (L) 03/29/2018   TSH 1.63 06/25/2017   INR 1.16 06/07/2013   HGBA1C 8.1 (A) 04/20/2018   MICROALBUR 13.9 (H) 10/13/2007    No results found.  Assessment &  Plan:   There are no diagnoses linked to this encounter.   No orders of the defined types were placed in this encounter.    Follow-up: No follow-ups on file.  Walker Kehr, MD

## 2018-08-05 ENCOUNTER — Encounter: Payer: Self-pay | Admitting: Obstetrics and Gynecology

## 2018-08-09 ENCOUNTER — Encounter: Payer: Self-pay | Admitting: Obstetrics and Gynecology

## 2018-08-09 ENCOUNTER — Ambulatory Visit (INDEPENDENT_AMBULATORY_CARE_PROVIDER_SITE_OTHER): Payer: Medicare Other | Admitting: Obstetrics and Gynecology

## 2018-08-09 ENCOUNTER — Other Ambulatory Visit: Payer: Self-pay

## 2018-08-09 VITALS — BP 142/82 | HR 72 | Wt 196.0 lb

## 2018-08-09 DIAGNOSIS — Z124 Encounter for screening for malignant neoplasm of cervix: Secondary | ICD-10-CM

## 2018-08-09 DIAGNOSIS — N764 Abscess of vulva: Secondary | ICD-10-CM | POA: Diagnosis not present

## 2018-08-09 DIAGNOSIS — B373 Candidiasis of vulva and vagina: Secondary | ICD-10-CM | POA: Diagnosis not present

## 2018-08-09 DIAGNOSIS — E2839 Other primary ovarian failure: Secondary | ICD-10-CM

## 2018-08-09 DIAGNOSIS — B3731 Acute candidiasis of vulva and vagina: Secondary | ICD-10-CM

## 2018-08-09 DIAGNOSIS — Z01419 Encounter for gynecological examination (general) (routine) without abnormal findings: Secondary | ICD-10-CM | POA: Diagnosis not present

## 2018-08-09 DIAGNOSIS — L732 Hidradenitis suppurativa: Secondary | ICD-10-CM

## 2018-08-09 MED ORDER — BETAMETHASONE VALERATE 0.1 % EX OINT: 1.0000 "application " | TOPICAL_OINTMENT | Freq: Two times a day (BID) | CUTANEOUS | 0 refills | Status: DC

## 2018-08-09 MED ORDER — FLUCONAZOLE 150 MG PO TABS: 150.0000 mg | ORAL_TABLET | Freq: Once | ORAL | 0 refills | Status: AC

## 2018-08-09 MED ORDER — SULFAMETHOXAZOLE-TRIMETHOPRIM 800-160 MG PO TABS: 1.0000 | ORAL_TABLET | Freq: Two times a day (BID) | ORAL | 0 refills | Status: DC

## 2018-08-09 NOTE — Progress Notes (Signed)
68 y.o. G0P0000 Single African American female here for annual exam and vaginal cysts inside labias that have been present for 1 year. She states they are boils, come and go. Getting worse with time, currently has 2 boils. Rarely sexually active, same long term partner, don't live together. No dyspareunia. No vaginal bleeding. No bowel or bladder c/o.  She is on amoxicillin for sinus infection and boils, started itching on Sunday, feels like she has a yeast infection.   She is a diabetic, last HgbA1C 8.1 in 10/19.  The patient has a h/o colon cancer (started in her appendix) in 1983 and is s/p partial colectomy  H/O cardiac issues.  H/O anemia, get iron transfusions.     No LMP recorded. Patient is postmenopausal.          Sexually active: No.  The current method of family planning is post menopausal status.    Exercising: Yes.    walking Smoker:  Yes, 1 pack every 4 days, no change.   Health Maintenance: Pap:  In 8s per patient, WNL History of abnormal Pap:  no MMG:  12/15/2017 Birads 1 negative BMD:   Never Colonoscopy: 09/29/2017 Polyps, f/u in 5 years TDaP:  Unsure, declines Gardasil: Never   reports that she has been smoking cigarettes. She has never used smokeless tobacco. She reports that she does not drink alcohol or use drugs. 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   Past Medical History:  Diagnosis Date  . Anemia   . COPD (chronic obstructive pulmonary disease) ([place])   . Diabetes mellitus   . Hyperlipidemia   . Hypertension   . Ischemic cardiomyopathy 06/11/2013  . Low back pain   . NSTEMI (non-ST elevated myocardial infarction) ([place]) 06/11/2013  . Obesity   . Personal history of colon cancer   . Renal insufficiency     Past Surgical History:  Procedure Laterality Date  . COLECTOMY    . LEFT HEART CATHETERIZATION WITH CORONARY ANGIOGRAM N/A 06/09/2013   Procedure: LEFT HEART CATHETERIZATION WITH CORONARY ANGIOGRAM;  Surgeon: Ramond Dial,  MD;  Location: [facility] CATH LAB;  Service: Cardiovascular;  Laterality: N/A;    Current Outpatient Medications  Medication Sig Dispense Refill  . amLODipine (NORVASC) 10 MG tablet Take 0.5 tablets (5 mg total) by mouth daily. 45 tablet 3  . amoxicillin-clavulanate (AUGMENTIN) 875-125 MG tablet Take 1 tablet by mouth 2 (two) times daily. 20 tablet 0  . aspirin 81 MG chewable tablet Chew 1 tablet (81 mg total) by mouth daily.    [name] BYSTOLIC 10 MG tablet TAKE 1 TABLET BY MOUTH EVERY DAY 90 tablet 1  . calcium carbonate (OS-CAL) 600 MG TABS tablet Take 600 mg by mouth 2 (two) times daily with a meal.    . fluticasone (FLONASE) 50 MCG/ACT nasal spray Place 2 sprays into both nostrils daily as needed for allergies or rhinitis.    [name] glipiZIDE (GLUCOTROL XL) 5 MG 24 hr tablet Take 1 tablet (5 mg total) by mouth daily with breakfast. 90 tablet 3  . hydrALAZINE (APRESOLINE) 25 MG tablet Take 1 tablet (25 mg total) by mouth 3 (three) times daily. 270 tablet 3  . HYDROcodone-homatropine (HYCODAN) 5-1.5 MG/5ML syrup Take 5 mLs by mouth every 8 (eight) hours as needed for cough. 240 mL 0  . hydrOXYzine (ATARAX/VISTARIL) 25 MG tablet Take 1-2 tablets (25-50 mg total) by mouth every 8 (eight) hours as needed for itching. 60 tablet 0  . loratadine (CLARITIN)  10 MG tablet Take 1 tablet (10 mg total) by mouth daily. 100 tablet 3  . nitroGLYCERIN (NITROSTAT) 0.4 MG SL tablet DISSOLVE 1 TABLET UNDER THE TONGUE EVERY 5 MINUTES AS  NEEDED FOR CHEST PAIN 90 tablet 0  . ONE TOUCH ULTRA TEST test strip USE TO TEST BLOOD SUGAR TWO TIMES A DAY AS DIRECTED 100 each 11  . potassium chloride SA (K-DUR,KLOR-CON) 20 MEQ tablet Take 1 tablet (20 mEq total) by mouth daily. 90 tablet 3  . ranitidine (ZANTAC) 150 MG tablet TAKE 1 TABLET BY MOUTH TWICE A DAY 60 tablet 11  . rosuvastatin (CRESTOR) 5 MG tablet TAKE 1 TABLET BY MOUTH  DAILY AT 6PM 90 tablet 3  . torsemide (DEMADEX) 100 MG tablet Take 0.5-1 tablets (50-100 mg total) by mouth  daily. 30 tablet 11  . triamcinolone cream (KENALOG) 0.1 % Apply 1 application topically 4 (four) times daily. 450 g 3  . vitamin B-12 (CYANOCOBALAMIN) 1000 MCG tablet Take 1,000 mcg by mouth daily.    . traMADol-acetaminophen (ULTRACET) 37.5-325 MG tablet Take 0.5-1 tablets by mouth every 8 (eight) hours as needed for severe pain. (Patient not taking: Reported on 08/09/2018) 100 tablet 3   No current facility-administered medications for this visit.     Family History  Problem Relation Age of Onset  . Hypertension Other     Review of Systems  Constitutional: Negative.   HENT: Negative.   Eyes: Negative.   Respiratory: Negative.   Cardiovascular: Negative.   Gastrointestinal: Negative.   Endocrine: Negative.   Genitourinary:       Vaginal lumps inside labia Vaginal itching  Musculoskeletal: Negative.   Skin: Negative.   Allergic/Immunologic: Negative.   Neurological: Negative.   Hematological: Negative.   Psychiatric/Behavioral: Negative.     Exam:   BP (!) 142/82 (BP Location: Right Arm, Patient Position: Sitting, Cuff Size: Normal)   Pulse 72   Wt 196 lb (88.9 kg)   BMI 35.85 kg/m   Weight change: @WEIGHTCHANGE @ Height:      Ht Readings from Last 3 Encounters:  08/03/18 5\' 2"  (1.575 m)  06/16/18 5\' 2"  (1.575 m)  05/11/18 5\' 2"  (1.575 m)    General appearance: alert, cooperative and appears stated age Head: Normocephalic, without obvious abnormality, atraumatic Neck: no adenopathy, supple, symmetrical, trachea midline and thyroid normal to inspection and palpation Lungs: clear to auscultation bilaterally Cardiovascular: regular rate and rhythm Breasts: normal appearance, no masses or tenderness Abdomen: soft, non-tender; non distended,  no masses,  no organomegaly Extremities: extremities normal, atraumatic, no cyanosis or edema Skin: Skin color, texture, turgor normal. No rashes or lesions Lymph nodes: Cervical, supraclavicular, and axillary nodes normal. No  abnormal inguinal nodes palpated Neurologic: Grossly normal   Pelvic: External genitalia:  On the right labia majora is a 1 cm tender raised lump, c/w abscess and 2 abscess that have drained and are resolving, open in the center. No surrounding erythema.               Urethra:  normal appearing urethra with no masses, tenderness or lesions              Bartholins and Skenes: normal                 Vagina: normal appearing vagina with normal color and discharge, no lesions              Cervix: no lesions  Bimanual Exam:  Uterus:  normal size, contour, position, consistency, mobility, non-tender              Adnexa: no mass, fullness, tenderness               Rectovaginal: Confirms               Anus:  normal sphincter tone, no lesions  Chaperone was present for exam.  Wet prep: no  clue, no trich, + wbc KOH: rare yeast PH: 5   A:  Well Woman with normal exam  Vulvar boils, 1 cm boil and 2 resolving boils. C/w Hydradenitis suppurativa  H/O DM, last HgbA1C was 8.1   Yeast vaginitis  P:   Pap with reflex hpv  Treat boil with warm compresses/hot soaks/bactrim DS  Discussed drainage if the boil gets larger or isn't resolving with conservative therapy  Return in one week for f/u  Treat yeast with diflucan 150 mg po q 72 hours x 2  Steroid ointment to vulva (not the abscess) secondary to generalized pruritus  Discussed using Clindamycin 1% gel BID to prevent recurrent boils. If that doesn't work would treat with daily doxycycline  Discussed breast self exam  Discussed calcium and vit D intake  Mammogram and colonoscopy UTD  DEXA ordered   In addition to the annual exam, ~10 minutes was spent face to face, over 50% in counseling on the vulvar boils/hydradenitis and yeast.   CC: Dr Alain Marion

## 2018-08-09 NOTE — Patient Instructions (Addendum)
EXERCISE AND DIET:  We recommended that you start or continue a regular exercise program for good health. Regular exercise means any activity that makes your heart beat faster and makes you sweat.  We recommend exercising at least 30 minutes per day at least 3 days a week, preferably 4 or 5.  We also recommend a diet low in fat and sugar.  Inactivity, poor dietary choices and obesity can cause diabetes, heart attack, stroke, and kidney damage, among others.    ALCOHOL AND SMOKING:  Women should limit their alcohol intake to no more than 7 drinks/beers/glasses of wine (combined, not each!) per week. Moderation of alcohol intake to this level decreases your risk of breast cancer and liver damage. And of course, no recreational drugs are part of a healthy lifestyle.  And absolutely no smoking or even second hand smoke. Most people know smoking can cause heart and lung diseases, but did you know it also contributes to weakening of your bones? Aging of your skin?  Yellowing of your teeth and nails?  CALCIUM AND VITAMIN D:  Adequate intake of calcium and Vitamin D are recommended.  The recommendations for exact amounts of these supplements seem to change often, but generally speaking 1,200 mg of calcium (between diet and supplement) and 800 units of Vitamin D per day seems prudent. Certain women may benefit from higher intake of Vitamin D.  If you are among these women, your doctor will have told you during your visit.    PAP SMEARS:  Pap smears, to check for cervical cancer or precancers,  have traditionally been done yearly, although recent scientific advances have shown that most women can have pap smears less often.  However, every woman still should have a physical exam from her gynecologist every year. It will include a breast check, inspection of the vulva and vagina to check for abnormal growths or skin changes, a visual exam of the cervix, and then an exam to evaluate the size and shape of the uterus and  ovaries.  And after 68 years of age, a rectal exam is indicated to check for rectal cancers. We will also provide age appropriate advice regarding health maintenance, like when you should have certain vaccines, screening for sexually transmitted diseases, bone density testing, colonoscopy, mammograms, etc.   MAMMOGRAMS:  All women over 40 years old should have a yearly mammogram. Many facilities now offer a "3D" mammogram, which may cost around $50 extra out of pocket. If possible,  we recommend you accept the option to have the 3D mammogram performed.  It both reduces the number of women who will be called back for extra views which then turn out to be normal, and it is better than the routine mammogram at detecting truly abnormal areas.    COLON CANCER SCREENING: Now recommend starting at age 45. At this time colonoscopy is not covered for routine screening until 50. There are take home tests that can be done between 45-49.   COLONOSCOPY:  Colonoscopy to screen for colon cancer is recommended for all women at age 50.  We know, you hate the idea of the prep.  We agree, BUT, having colon cancer and not knowing it is worse!!  Colon cancer so often starts as a polyp that can be seen and removed at colonscopy, which can quite literally save your life!  And if your first colonoscopy is normal and you have no family history of colon cancer, most women don't have to have it again for   10 years.  Once every ten years, you can do something that may end up saving your life, right?  We will be happy to help you get it scheduled when you are ready.  Be sure to check your insurance coverage so you understand how much it will cost.  It may be covered as a preventative service at no cost, but you should check your particular policy.      Breast Self-Awareness Breast self-awareness means being familiar with how your breasts look and feel. It involves checking your breasts regularly and reporting any changes to your  health care provider. Practicing breast self-awareness is important. A change in your breasts can be a sign of a serious medical problem. Being familiar with how your breasts look and feel allows you to find any problems early, when treatment is more likely to be successful. All women should practice breast self-awareness, including women who have had breast implants. How to do a breast self-exam One way to learn what is normal for your breasts and whether your breasts are changing is to do a breast self-exam. To do a breast self-exam: Look for Changes  1. Remove all the clothing above your waist. 2. Stand in front of a mirror in a room with good lighting. 3. Put your hands on your hips. 4. Push your hands firmly downward. 5. Compare your breasts in the mirror. Look for differences between them (asymmetry), such as: ? Differences in shape. ? Differences in size. ? Puckers, dips, and bumps in one breast and not the other. 6. Look at each breast for changes in your skin, such as: ? Redness. ? Scaly areas. 7. Look for changes in your nipples, such as: ? Discharge. ? Bleeding. ? Dimpling. ? Redness. ? A change in position. Feel for Changes Carefully feel your breasts for lumps and changes. It is best to do this while lying on your back on the floor and again while sitting or standing in the shower or tub with soapy water on your skin. Feel each breast in the following way:  Place the arm on the side of the breast you are examining above your head.  Feel your breast with the other hand.  Start in the nipple area and make  inch (2 cm) overlapping circles to feel your breast. Use the pads of your three middle fingers to do this. Apply light pressure, then medium pressure, then firm pressure. The light pressure will allow you to feel the tissue closest to the skin. The medium pressure will allow you to feel the tissue that is a little deeper. The firm pressure will allow you to feel the tissue  close to the ribs.  Continue the overlapping circles, moving downward over the breast until you feel your ribs below your breast.  Move one finger-width toward the center of the body. Continue to use the  inch (2 cm) overlapping circles to feel your breast as you move slowly up toward your collarbone.  Continue the up and down exam using all three pressures until you reach your armpit.  Write Down What You Find  Write down what is normal for each breast and any changes that you find. Keep a written record with breast changes or normal findings for each breast. By writing this information down, you do not need to depend only on memory for size, tenderness, or location. Write down where you are in your menstrual cycle, if you are still menstruating. If you are having trouble noticing differences   in your breasts, do not get discouraged. With time you will become more familiar with the variations in your breasts and more comfortable with the exam. How often should I examine my breasts? Examine your breasts every month. If you are breastfeeding, the best time to examine your breasts is after a feeding or after using a breast pump. If you menstruate, the best time to examine your breasts is 5-7 days after your period is over. During your period, your breasts are lumpier, and it may be more difficult to notice changes. When should I see my health care provider? See your health care provider if you notice:  A change in shape or size of your breasts or nipples.  A change in the skin of your breast or nipples, such as a reddened or scaly area.  Unusual discharge from your nipples.  A lump or thick area that was not there before.  Pain in your breasts.  Anything that concerns you.    Hidradenitis Suppurativa Hidradenitis suppurativa is a long-term (chronic) skin disease. It is similar to a severe form of acne, but it affects areas of the body where acne would be unusual, especially areas of the  body where skin rubs against skin and becomes moist. These include:  Underarms.  Groin.  Genital area.  Buttocks.  Upper thighs.  Breasts. Hidradenitis suppurativa may start out as small lumps or pimples caused by blocked sweat glands or hair follicles. Pimples may develop into deep sores that break open (rupture) and drain pus. Over time, affected areas of skin may thicken and become scarred. This condition is rare and does not spread from person to person (non-contagious). What are the causes? The exact cause of this condition is not known. It may be related to:  Female and female hormones.  An overactive disease-fighting system (immune system). The immune system may over-react to blocked hair follicles or sweat glands and cause swelling and pus-filled sores. What increases the risk? You are more likely to develop this condition if you:  Are female.  Are 48-48 years old.  Have a family history of hidradenitis suppurativa.  Have a personal history of acne.  Are overweight.  Smoke.  Take the medicine lithium. What are the signs or symptoms? The first symptoms are usually painful bumps in the skin, similar to pimples. The condition may get worse over time (progress), or it may only cause mild symptoms. If the disease progresses, symptoms may include:  Skin bumps getting bigger and growing deeper into the skin.  Bumps rupturing and draining pus.  Itchy, infected skin.  Skin getting thicker and scarred.  Tunnels under the skin (fistulas) where pus drains from a bump.  Pain during daily activities, such as pain during walking if your groin area is affected.  Emotional problems, such as stress or depression. This condition may affect your appearance and your ability or willingness to wear certain clothes or do certain activities. How is this diagnosed? This condition is diagnosed by a health care provider who specializes in skin diseases (dermatologist). You may be  diagnosed based on:  Your symptoms and medical history.  A physical exam.  Testing a pus sample for infection.  Blood tests. How is this treated? Your treatment will depend on how severe your symptoms are. The same treatment will not work for everybody with this condition. You may need to try several treatments to find what works best for you. Treatment may include:  Cleaning and bandaging (dressing) your wounds as needed.  Lifestyle changes, such as new skin care routines.  Taking medicines, such as: ? Antibiotics. ? Acne medicines. ? Medicines to reduce the activity of the immune system. ? A diabetes medicine (metformin). ? Birth control pills, for women. ? Steroids to reduce swelling and pain.  Working with a mental health care provider, if you experience emotional distress due to this condition. If you have severe symptoms that do not get better with medicine, you may need surgery. Surgery may involve:  Using a laser to clear the skin and remove hair follicles.  Opening and draining deep sores.  Removing the areas of skin that are diseased and scarred. Follow these instructions at home: Medicines   Take over-the-counter and prescription medicines only as told by your health care provider.  If you were prescribed an antibiotic medicine, take it as told by your health care provider. Do not stop taking the antibiotic even if your condition improves. Skin care  If you have open wounds, cover them with a clean dressing as told by your health care provider. Keep wounds clean by washing them gently with soap and water when you bathe.  Do not shave the areas where you get hidradenitis suppurativa.  Do not wear deodorant.  Wear loose-fitting clothes.  Try to avoid getting overheated or sweaty. If you get sweaty or wet, change into clean, dry clothes as soon as you can.  To help relieve pain and itchiness, cover sore areas with a warm, clean washcloth (warm compress) for  5-10 minutes as often as needed.  If told by your health care provider, take a bleach bath twice a week: ? Fill your bathtub halfway with water. ? Pour in  cup of unscented household bleach. ? Soak in the tub for 5-10 minutes. ? Only soak from the neck down. Avoid water on your face and hair. ? Shower to rinse off the bleach from your skin. General instructions  Learn as much as you can about your disease so that you have an active role in your treatment. Work closely with your health care provider to find treatments that work for you.  If you are overweight, work with your health care provider to lose weight as recommended.  Do not use any products that contain nicotine or tobacco, such as cigarettes and e-cigarettes. If you need help quitting, ask your health care provider.  If you struggle with living with this condition, talk with your health care provider or work with a mental health care provider as recommended.  Keep all follow-up visits as told by your health care provider. This is important. Where to find more information  Hidradenitis Espino.: [email or web address]/ Contact a health care provider if you have:  A flare-up of hidradenitis suppurativa.  A fever or chills.  Trouble controlling your symptoms at home.  Trouble doing your daily activities because of your symptoms.  Trouble dealing with emotional problems related to your condition. Summary  Hidradenitis suppurativa is a long-term (chronic) skin disease. It is similar to a severe form of acne, but it affects areas of the body where acne would be unusual.  The first symptoms are usually painful bumps in the skin, similar to pimples. The condition may get worse over time (progress), or it may only cause mild symptoms.  If you have open wounds, cover them with a clean dressing as told by your health care provider. Keep wounds clean by washing them gently with soap and water when you  bathe.  Besides skin care,  treatment may include medicines, laser treatment, and surgery. This information is not intended to replace advice given to you by your health care provider. Make sure you discuss any questions you have with your health care provider. Document Released: 02/04/2004 Document Revised: 06/30/2017 Document Reviewed: 06/30/2017 Elsevier Interactive Patient Education  2019 [employer]  Skin Abscess  A skin abscess is an infected area on or under your skin that contains a collection of pus and other material. An abscess may also be called a furuncle, carbuncle, or boil. An abscess can occur in or on almost any part of your body. Some abscesses break open (rupture) on their own. Most continue to get worse unless they are treated. The infection can spread deeper into the body and eventually into your blood, which can make you feel ill. Treatment usually involves draining the abscess. What are the causes? An abscess occurs when germs, like bacteria, pass through your skin and cause an infection. This may be caused by:  A scrape or cut on your skin.  A puncture wound through your skin, including a needle injection or insect bite.  Blocked oil or sweat glands.  Blocked and infected hair follicles.  A cyst that forms beneath your skin (sebaceous cyst) and becomes infected. What increases the risk? This condition is more likely to develop in people who:  Have a weak body defense system (immune system).  Have diabetes.  Have dry and irritated skin.  Get frequent injections or use illegal IV drugs.  Have a foreign body in a wound, such as a splinter.  Have problems with their lymph system or veins. What are the signs or symptoms? Symptoms of this condition include:  A painful, firm bump under the skin.  A bump with pus at the top. This may break through the skin and drain. Other symptoms include:  Redness surrounding the abscess site.  Warmth.  Swelling of  the lymph nodes (glands) near the abscess.  Tenderness.  A sore on the skin. How is this diagnosed? This condition may be diagnosed based on:  A physical exam.  Your medical history.  A sample of pus. This may be used to find out what is causing the infection.  Blood tests.  Imaging tests, such as an ultrasound, CT scan, or MRI. How is this treated? A small abscess that drains on its own may not need treatment. Treatment for larger abscesses may include:  Moist heat or heat pack applied to the area several times a day.  A procedure to drain the abscess (incision and drainage).  Antibiotic medicines. For a severe abscess, you may first get antibiotics through an IV and then change to antibiotics by mouth. Follow these instructions at home: Medicines   Take over-the-counter and prescription medicines only as told by your health care provider.  If you were prescribed an antibiotic medicine, take it as told by your health care provider. Do not stop taking the antibiotic even if you start to feel better. Abscess care   If you have an abscess that has not drained, apply heat to the affected area. Use the heat source that your health care provider recommends, such as a moist heat pack or a heating pad. ? Place a towel between your skin and the heat source. ? Leave the heat on for 20-30 minutes. ? Remove the heat if your skin turns bright red. This is especially important if you are unable to feel pain, heat, or cold. You may have a greater risk  of getting burned.  Follow instructions from your health care provider about how to take care of your abscess. Make sure you: ? Cover the abscess with a bandage (dressing). ? Change your dressing or gauze as told by your health care provider. ? Wash your hands with soap and water before you change the dressing or gauze. If soap and water are not available, use hand sanitizer.  Check your abscess every day for signs of a worsening  infection. Check for: ? More redness, swelling, or pain. ? More fluid or blood. ? Warmth. ? More pus or a bad smell. General instructions  To avoid spreading the infection: ? Do not share personal care items, towels, or hot tubs with others. ? Avoid making skin contact with other people.  Keep all follow-up visits as told by your health care provider. This is important. Contact a health care provider if you have:  More redness, swelling, or pain around your abscess.  More fluid or blood coming from your abscess.  Warm skin around your abscess.  More pus or a bad smell coming from your abscess.  A fever.  Muscle aches.  Chills or a general ill feeling. Get help right away if you:  Have severe pain.  See red streaks on your skin spreading away from the abscess. Summary  A skin abscess is an infected area on or under your skin that contains a collection of pus and other material.  A small abscess that drains on its own may not need treatment.  Treatment for larger abscesses may include having a procedure to drain the abscess and taking an antibiotic. This information is not intended to replace advice given to you by your health care provider. Make sure you discuss any questions you have with your health care provider. Document Released: 04/01/2005 Document Revised: 08/05/2017 Document Reviewed: 08/05/2017 Elsevier Interactive Patient Education  2019 [employer].

## 2018-08-10 ENCOUNTER — Other Ambulatory Visit (HOSPITAL_COMMUNITY)
Admission: RE | Admit: 2018-08-10 | Discharge: 2018-08-10 | Disposition: A | Discharge status: Home / Self Care | Payer: Medicare Other | Source: Ambulatory Visit | Attending: Obstetrics and Gynecology | Admitting: Obstetrics and Gynecology

## 2018-08-10 DIAGNOSIS — Z124 Encounter for screening for malignant neoplasm of cervix: Secondary | ICD-10-CM | POA: Insufficient documentation

## 2018-08-12 LAB — CYTOLOGY - PAP: Diagnosis: NEGATIVE

## 2018-08-16 ENCOUNTER — Encounter: Payer: Self-pay | Admitting: Obstetrics and Gynecology

## 2018-08-16 ENCOUNTER — Ambulatory Visit (INDEPENDENT_AMBULATORY_CARE_PROVIDER_SITE_OTHER): Payer: Medicare Other | Admitting: Obstetrics and Gynecology

## 2018-08-16 ENCOUNTER — Other Ambulatory Visit: Payer: Self-pay | Admitting: Obstetrics and Gynecology

## 2018-08-16 VITALS — BP 120/62 | HR 76 | Wt 196.0 lb

## 2018-08-16 DIAGNOSIS — N9089 Other specified noninflammatory disorders of vulva and perineum: Secondary | ICD-10-CM | POA: Diagnosis not present

## 2018-08-16 DIAGNOSIS — E2839 Other primary ovarian failure: Secondary | ICD-10-CM

## 2018-08-16 DIAGNOSIS — L732 Hidradenitis suppurativa: Secondary | ICD-10-CM | POA: Diagnosis not present

## 2018-08-16 DIAGNOSIS — L720 Epidermal cyst: Secondary | ICD-10-CM | POA: Diagnosis not present

## 2018-08-16 MED ORDER — CLINDAMYCIN PHOSPHATE 1 % EX GEL: Freq: Two times a day (BID) | CUTANEOUS | 2 refills | Status: DC

## 2018-08-16 NOTE — Patient Instructions (Signed)

## 2018-08-16 NOTE — Progress Notes (Signed)
GYNECOLOGY  VISIT   HPI: 68 y.o.   Single White or Caucasian Not Hispanic or Latino  female   [id number] with No LMP recorded. Patient is postmenopausal.   here for recheck of lump on right labia majora c/w abscess.  Reports she feels that two of have drained. Reports one is still present and is painful to the touch. She was started on Bactrim DS last week.   GYNECOLOGIC HISTORY: No LMP recorded. Patient is postmenopausal. Contraception: Postmenopsual Menopausal hormone therapy: None        OB History    Gravida  0   Para  0   Term  0   Preterm  0   AB  0   Living  0     SAB  0   TAB  0   Ectopic  0   Multiple  0   Live Births  0              Patient Active Problem List   Diagnosis Date Noted  . Vaginal cysts 08/03/2018  . Hypertensive heart disease with congestive heart failure ([place]) 05/11/2018  . Boils 04/27/2018  . Edema 03/17/2017  . Rash and nonspecific skin eruption 12/08/2016  . CKD stage 4 due to type 2 diabetes mellitus ([place]) 09/24/2016  . Cough 04/13/2016  . Left shoulder pain 09/16/2015  . Allergic rhinitis 09/16/2015  . Goiter 10/19/2014  . Low back pain 11/13/2013  . Dizziness 11/13/2013  . Pruritus 08/01/2013  . Well adult exam 08/01/2013  . GERD (gastroesophageal reflux disease) 08/01/2013  . CAD (coronary artery disease) 06/11/2013  . Chronic combined systolic and diastolic CHF (congestive heart failure) ([place]) 06/11/2013  . Hypertension associated with diabetes ([place]) 06/07/2013  . Personal history of colon cancer   . Hyperlipidemia   . Obesity   . Disorder resulting from impaired renal function 12/19/2008  . LEG PAIN, BILATERAL 09/11/2008  . Acute sinusitis 06/13/2008  . COPD mixed type ([place]) 02/09/2008  . Controlled type 2 diabetes mellitus with circulatory disorder, without long-term current use of insulin ([place]) 10/13/2007  . Iron deficiency anemia 10/13/2007  . TOBACCO USE DISORDER/SMOKER-SMOKING CESSATION DISCUSSED 10/13/2007  .  WEIGHT LOSS, ABNORMAL 10/13/2007  . History of malignant neoplasm of large intestine 10/13/2007    Past Medical History:  Diagnosis Date  . Anemia   . COPD (chronic obstructive pulmonary disease) ([place])   . Diabetes mellitus   . Hyperlipidemia   . Hypertension   . Ischemic cardiomyopathy 06/11/2013  . Low back pain   . NSTEMI (non-ST elevated myocardial infarction) ([place]) 06/11/2013  . Obesity   . Personal history of colon cancer   . Renal insufficiency     Past Surgical History:  Procedure Laterality Date  . COLECTOMY    . LEFT HEART CATHETERIZATION WITH CORONARY ANGIOGRAM N/A 06/09/2013   Procedure: LEFT HEART CATHETERIZATION WITH CORONARY ANGIOGRAM;  Surgeon: Ramond Dial, MD;  Location: [facility] CATH LAB;  Service: Cardiovascular;  Laterality: N/A;    Current Outpatient Medications  Medication Sig Dispense Refill  . amLODipine (NORVASC) 10 MG tablet Take 0.5 tablets (5 mg total) by mouth daily. 45 tablet 3  . amoxicillin-clavulanate (AUGMENTIN) 875-125 MG tablet Take 1 tablet by mouth 2 (two) times daily. 20 tablet 0  . aspirin 81 MG chewable tablet Chew 1 tablet (81 mg total) by mouth daily.    . betamethasone valerate ointment (VALISONE) 0.1 % Apply 1 application topically 2 (two) times daily. 15 g 0  .  BYSTOLIC 10 MG tablet TAKE 1 TABLET BY MOUTH EVERY DAY 90 tablet 1  . calcium carbonate (OS-CAL) 600 MG TABS tablet Take 600 mg by mouth 2 (two) times daily with a meal.    . fluticasone (FLONASE) 50 MCG/ACT nasal spray Place 2 sprays into both nostrils daily as needed for allergies or rhinitis.    [name] glipiZIDE (GLUCOTROL XL) 5 MG 24 hr tablet Take 1 tablet (5 mg total) by mouth daily with breakfast. 90 tablet 3  . hydrALAZINE (APRESOLINE) 25 MG tablet Take 1 tablet (25 mg total) by mouth 3 (three) times daily. 270 tablet 3  . HYDROcodone-homatropine (HYCODAN) 5-1.5 MG/5ML syrup Take 5 mLs by mouth every 8 (eight) hours as needed for cough. 240 mL 0  . hydrOXYzine  (ATARAX/VISTARIL) 25 MG tablet Take 1-2 tablets (25-50 mg total) by mouth every 8 (eight) hours as needed for itching. 60 tablet 0  . loratadine (CLARITIN) 10 MG tablet Take 1 tablet (10 mg total) by mouth daily. 100 tablet 3  . nitroGLYCERIN (NITROSTAT) 0.4 MG SL tablet DISSOLVE 1 TABLET UNDER THE TONGUE EVERY 5 MINUTES AS  NEEDED FOR CHEST PAIN 90 tablet 0  . ONE TOUCH ULTRA TEST test strip USE TO TEST BLOOD SUGAR TWO TIMES A DAY AS DIRECTED 100 each 11  . potassium chloride SA (K-DUR,KLOR-CON) 20 MEQ tablet Take 1 tablet (20 mEq total) by mouth daily. 90 tablet 3  . ranitidine (ZANTAC) 150 MG tablet TAKE 1 TABLET BY MOUTH TWICE A DAY 60 tablet 11  . rosuvastatin (CRESTOR) 5 MG tablet TAKE 1 TABLET BY MOUTH  DAILY AT 6PM 90 tablet 3  . torsemide (DEMADEX) 100 MG tablet Take 0.5-1 tablets (50-100 mg total) by mouth daily. 30 tablet 11  . traMADol-acetaminophen (ULTRACET) 37.5-325 MG tablet Take 0.5-1 tablets by mouth every 8 (eight) hours as needed for severe pain. 100 tablet 3  . triamcinolone cream (KENALOG) 0.1 % Apply 1 application topically 4 (four) times daily. 450 g 3  . vitamin B-12 (CYANOCOBALAMIN) 1000 MCG tablet Take 1,000 mcg by mouth daily.    [name] sulfamethoxazole-trimethoprim (BACTRIM DS) 800-160 MG tablet Take 1 tablet by mouth 2 (two) times daily. One PO BID x 3 days (Patient not taking: Reported on 08/16/2018) 14 tablet 0   No current facility-administered medications for this visit.      ALLERGIES: Tradjenta [linagliptin]; Atorvastatin; Enalapril maleate; Farxiga [dapagliflozin]; Hydrochlorothiazide; Kenalog [triamcinolone acetonide]; Metformin and related; Propoxyphene n-acetaminophen; Simvastatin; and Spironolactone  Family History  Problem Relation Age of Onset  . Hypertension Other     Social History   Socioeconomic History  . Marital status: Single    Spouse name: Not on file  . Number of children: Not on file  . Years of education: Not on file  . Highest education  level: Not on file  Occupational History  . Not on file  Social Needs  . Financial resource strain: Not on file  . Food insecurity:    Worry: Not on file    Inability: Not on file  . Transportation needs:    Medical: Not on file    Non-medical: Not on file  Tobacco Use  . Smoking status: Light Tobacco Smoker    Types: Cigarettes  . Smokeless tobacco: Never Used  Substance and Sexual Activity  . Alcohol use: No  . Drug use: No  . Sexual activity: Not Currently    Birth control/protection: Post-menopausal  Lifestyle  . Physical activity:    Days per week:  Not on file    Minutes per session: Not on file  . Stress: Not on file  Relationships  . Social connections:    Talks on phone: Not on file    Gets together: Not on file    Attends religious service: Not on file    Active member of club or organization: Not on file    Attends meetings of clubs or organizations: Not on file    Relationship status: Not on file  . Intimate partner violence:    Fear of current or ex partner: Not on file    Emotionally abused: Not on file    Physically abused: Not on file    Forced sexual activity: Not on file  Other Topics Concern  . Not on file  Social History Narrative  . Not on file    Review of Systems  Constitutional: Negative.   HENT: Negative.   Eyes: Negative.   Respiratory: Negative.   Cardiovascular: Negative.   Gastrointestinal: Negative.   Genitourinary:       Lump on labia that is painful  Musculoskeletal: Negative.   Skin: Negative.   Neurological: Negative.   Endo/Heme/Allergies: Negative.   Psychiatric/Behavioral: Negative.     PHYSICAL EXAMINATION:    BP 120/62 (BP Location: Right Arm, Patient Position: Sitting, Cuff Size: Large)   Pulse 76   Wt 196 lb (88.9 kg)   BMI 35.85 kg/m     General appearance: alert, cooperative and appears stated age  Pelvic: External genitalia:  1 cm raised lesion, tender to the touch, last week I thought it was a boil  because of the surrounding boils, on today's exam less clear. 2 boils mostly healed on the vulva                The risks of the procedure were reviewed with the patient and a consent was signed. The area was cleansed with betadine and injected with 1% lidocaine. A #11 blade was used to remove the lesion and surrounding tissue. The defect was closed with 3 deep stiches of 4-0 vicryl and ~5 simple stitches of 4-0 vicryl. The incision was slightly oozy, stopped with pressure. The patient tolerated the procedure well.   Chaperone was present for exam.  ASSESSMENT Vulvar lesion, tender, bothersome Hidradenitis suppurativa      PLAN Vulvar lesion removed Clindamycin gel for bid use for 2 months, then f/u   An After Visit Summary was printed and given to the patient.

## 2018-08-17 ENCOUNTER — Ambulatory Visit: Payer: Self-pay | Admitting: Obstetrics and Gynecology

## 2018-08-24 ENCOUNTER — Encounter: Payer: Self-pay | Admitting: Internal Medicine

## 2018-08-24 ENCOUNTER — Ambulatory Visit (INDEPENDENT_AMBULATORY_CARE_PROVIDER_SITE_OTHER): Payer: Medicare Other | Admitting: Internal Medicine

## 2018-08-24 VITALS — BP 130/88 | HR 68 | Ht 62.0 in | Wt 196.0 lb

## 2018-08-24 DIAGNOSIS — E1159 Type 2 diabetes mellitus with other circulatory complications: Secondary | ICD-10-CM | POA: Diagnosis not present

## 2018-08-24 DIAGNOSIS — E785 Hyperlipidemia, unspecified: Secondary | ICD-10-CM | POA: Diagnosis not present

## 2018-08-24 DIAGNOSIS — Z6837 Body mass index (BMI) 37.0-37.9, adult: Secondary | ICD-10-CM

## 2018-08-24 LAB — POCT GLYCOSYLATED HEMOGLOBIN (HGB A1C): Hemoglobin A1C: 8.7 % — AB (ref 4.0–5.6)

## 2018-08-24 NOTE — Progress Notes (Addendum)
Patient ID: RASHAE ROTHER, female   DOB: 04-19-1951, 68 y.o.   MRN: [id number]  HPI: [name] is a 68 y.o.-year-old female, returning for f/u DM2, dx in 2006, insulin-independent, controlled, with complications (CAD - s/p AMI 06/2013, CHF; CKD; PN; DR). Last visit 4 months ago.  She had cysts removed recently. She was on ABx. Developed an yeast infection >> was on Diflucan.  She had an iron infusion in 06/2018 x2.  She had an URI.   Last hemoglobin A1c was: 04/20/2018: HbA1c calculated from fructosamine is slightly better than before, at 7%. 12/15/2017: HbA1c calculated from fructosamine: 7.17%, slightly improved from last visit  08/17/2017: HbA1c calculated from fructosamine: 7.2%, slightly improved. 04/15/2017: HbA1c calculated from the fructosamine is lower than measured, at 7.3% Lab Results  Component Value Date   HGBA1C 8.1 (A) 04/20/2018   HGBA1C 9.3 (H) 10/26/2017   HGBA1C 8.6 (H) 06/25/2017  08/21/2016: HbA1c 6.0%.  She refused insulin in the past. She started to change her diet after her hemoglobin A1c returned high, at 16.2% in 09/2014. She cut down portions, changed the meal content to include more fiber and less carbs.  HbA1c decreased dramatically.  Pt is on: - Glipizide ER 5 mg in a.m.-started 2016 She stopped metformin ER in 01/2017. She tried Tradjenta 5 mg daily in am >> CP with it (started 10/2014) >> had to stop - she did see her cardiologist since then She tried [place] >> decreased [name] and yeast inf. She tried regular metformin >> diarrhea  Pt checks her sugars 2X a day: - am:  87-121, 137 >> 97-133 >> 87-124, 137 - 2h after b'fast:  102-130, 140 >> 100-150 >> 107-141, 152 - before lunch:  130-166 >> 131-143, 153 >> 127-159, 168 - 2h after lunch: 128-150, 167 >> 133-155 >> 111, 135-166 - before dinner: 116-154, 170 >> 100-136 >> 89-168, 171 - 2h after dinner: 119-162 >> 68-149 >> 120-170, 170 - bedtime:   n/c >> 137 >> n/c - nighttime:  n/c Lowest sugar was 87 >> 68 >> 70 x1; it is unclear at which level she has hypoglycemia awareness Highest sugar was  270 - colonoscopy >> 178 >> 275.  Glucometer: One Touch Ultra  Pt's meals are: - Breakfast: oatmeal, cereals, [place] bacon, egg toast - Lunch: sandwich or fruit salad - Dinner: salmon/chicken, Brussel sprouts, other veggies, sweet potatoes or brown rice, spaghetti - Snacks: 2: carrots with dip; yoghurt with fruit, veggies + dip She walks for exercise. She saw Dr. Hollie Salk with nephrology.  She was started on a high potassium low protein diet.  -+ CKD, last BUN/creatinine -she was referred to nephrology: Lab Results  Component Value Date   BUN 41 (H) 03/29/2018   CREATININE 2.73 (H) 03/29/2018  She stopped olmesartan due to worsening kidney function and cough. -+ HL; last set of lipids: Lab Results  Component Value Date   CHOL 129 03/29/2018   HDL 46 03/29/2018   LDLCALC 69 03/29/2018   TRIG 71 03/29/2018   CHOLHDL 2.8 03/29/2018  On Crestor 5 every other day. - last eye exam was in 2019: + DR, + cataracts.  Had laser surgery. Dr. Posey Pronto. -+ Numbness and tingling in her feet.  She also has a history of HTN, GERD, and anemia.  She also has a history of goiter.  Latest thyroid ultrasound showed only small cysts, no nodules  Pt denies: - feeling nodules in neck - hoarseness - dysphagia - choking - SOB with lying  down  Latest TSH normal Lab Results  Component Value Date   TSH 1.63 06/25/2017   ROS: Constitutional: no weight gain/+ weight loss, no fatigue, no subjective hyperthermia, no subjective hypothermia, + nocturia Eyes: no blurry vision, no xerophthalmia ENT: no sore throat, + see HPI Cardiovascular: no CP/no SOB/no palpitations/no leg swelling Respiratory: no cough/no SOB/no wheezing Gastrointestinal: no N/no V/no D/+ C/no acid reflux Musculoskeletal: no muscle aches/+ joint aches Skin: no rashes, no hair loss Neurological: no tremors/+   Numbness/+ tingling/no dizziness  I reviewed pt's medications, allergies, PMH, social hx, family hx, and changes were documented in the history of present illness. Otherwise, unchanged from my initial visit note.  Past Medical History:  Diagnosis Date  . Anemia   . COPD (chronic obstructive pulmonary disease) ([place])   . Diabetes mellitus   . Hyperlipidemia   . Hypertension   . Ischemic cardiomyopathy 06/11/2013  . Low back pain   . NSTEMI (non-ST elevated myocardial infarction) ([place]) 06/11/2013  . Obesity   . Personal history of colon cancer   . Renal insufficiency    Past Surgical History:  Procedure Laterality Date  . COLECTOMY    . LEFT HEART CATHETERIZATION WITH CORONARY ANGIOGRAM N/A 06/09/2013   Procedure: LEFT HEART CATHETERIZATION WITH CORONARY ANGIOGRAM;  Surgeon: Ramond Dial, MD;  Location: [facility] CATH LAB;  Service: Cardiovascular;  Laterality: N/A;   History   Social History  . Marital Status: Single    Spouse Name: N/A  . Number of Children: 0   Occupational History  . retired   Social History Main Topics  . Smoking status:  former smoker     Types: Cigarettes  . Smokeless tobacco: Never Used  . Alcohol Use: No  . Drug Use: No   Current Outpatient Medications on File Prior to Visit  Medication Sig Dispense Refill  . amLODipine (NORVASC) 10 MG tablet Take 0.5 tablets (5 mg total) by mouth daily. 45 tablet 3  . aspirin 81 MG chewable tablet Chew 1 tablet (81 mg total) by mouth daily.    . betamethasone valerate ointment (VALISONE) 0.1 % Apply 1 application topically 2 (two) times daily. 15 g 0  . BYSTOLIC 10 MG tablet TAKE 1 TABLET BY MOUTH EVERY DAY 90 tablet 1  . calcium carbonate (OS-CAL) 600 MG TABS tablet Take 600 mg by mouth 2 (two) times daily with a meal.    . clindamycin (CLINDAGEL) 1 % gel Apply topically 2 (two) times daily. Use for 2 months 30 g 2  . fluticasone (FLONASE) 50 MCG/ACT nasal spray Place 2 sprays into both nostrils daily as needed  for allergies or rhinitis.    [name] glipiZIDE (GLUCOTROL XL) 5 MG 24 hr tablet Take 1 tablet (5 mg total) by mouth daily with breakfast. 90 tablet 3  . hydrALAZINE (APRESOLINE) 25 MG tablet Take 1 tablet (25 mg total) by mouth 3 (three) times daily. 270 tablet 3  . HYDROcodone-homatropine (HYCODAN) 5-1.5 MG/5ML syrup Take 5 mLs by mouth every 8 (eight) hours as needed for cough. 240 mL 0  . hydrOXYzine (ATARAX/VISTARIL) 25 MG tablet Take 1-2 tablets (25-50 mg total) by mouth every 8 (eight) hours as needed for itching. 60 tablet 0  . loratadine (CLARITIN) 10 MG tablet Take 1 tablet (10 mg total) by mouth daily. 100 tablet 3  . nitroGLYCERIN (NITROSTAT) 0.4 MG SL tablet DISSOLVE 1 TABLET UNDER THE TONGUE EVERY 5 MINUTES AS  NEEDED FOR CHEST PAIN 90 tablet 0  . ONE  TOUCH ULTRA TEST test strip USE TO TEST BLOOD SUGAR TWO TIMES A DAY AS DIRECTED 100 each 11  . potassium chloride SA (K-DUR,KLOR-CON) 20 MEQ tablet Take 1 tablet (20 mEq total) by mouth daily. 90 tablet 3  . ranitidine (ZANTAC) 150 MG tablet TAKE 1 TABLET BY MOUTH TWICE A DAY 60 tablet 11  . rosuvastatin (CRESTOR) 5 MG tablet TAKE 1 TABLET BY MOUTH  DAILY AT 6PM 90 tablet 3  . sulfamethoxazole-trimethoprim (BACTRIM DS) 800-160 MG tablet Take 1 tablet by mouth 2 (two) times daily. One PO BID x 3 days 14 tablet 0  . torsemide (DEMADEX) 100 MG tablet Take 0.5-1 tablets (50-100 mg total) by mouth daily. 30 tablet 11  . traMADol-acetaminophen (ULTRACET) 37.5-325 MG tablet Take 0.5-1 tablets by mouth every 8 (eight) hours as needed for severe pain. 100 tablet 3  . triamcinolone cream (KENALOG) 0.1 % Apply 1 application topically 4 (four) times daily. 450 g 3  . vitamin B-12 (CYANOCOBALAMIN) 1000 MCG tablet Take 1,000 mcg by mouth daily.     No current facility-administered medications on file prior to visit.    Allergies  Allergen Reactions  . Tradjenta [Linagliptin] Anaphylaxis    CP  . Atorvastatin     REACTION: aches and pains  . Enalapril  Maleate     REACTION: cough  . Farxiga [Dapagliflozin] Itching  . Hydrochlorothiazide     REACTION: hair loss  . Kenalog [Triamcinolone Acetonide]     HANDS NUMB   . Metformin And Related     Diarrhea, dizziness  . Propoxyphene N-Acetaminophen Hives  . Simvastatin     REACTION: cramps  . Spironolactone     REACTION: cramps   Family History  Problem Relation Age of Onset  . Hypertension Other    PE: BP 130/88   Pulse 68   Ht 5\' 2"  (1.575 m)   Wt 196 lb (88.9 kg)   SpO2 98%   BMI 35.85 kg/m  Body mass index is 35.85 kg/m. Wt Readings from Last 3 Encounters:  08/24/18 196 lb (88.9 kg)  08/16/18 196 lb (88.9 kg)  08/09/18 196 lb (88.9 kg)   Constitutional: overweight, in NAD Eyes: PERRLA, EOMI, no exophthalmos ENT: moist mucous membranes, + mild symmetric thyromegaly, no cervical lymphadenopathy Cardiovascular: RRR, +1/6 SEM, + L>R LE edema Respiratory: CTA B Gastrointestinal: abdomen soft, NT, ND, BS+ Musculoskeletal: no deformities, strength intact in all 4 Skin: moist, warm, no rashes Neurological: no tremor with outstretched hands, DTR normal in all 4  ASSESSMENT: 1. DM2, insulin-independent, controlled, with complications - CAD, s/p AMI 06/2013 - Dr Acie Fredrickson - CHF - CKD - PN - DR - Dr. Jalene Mullet ([facility]) - on IO injections >> improving  2. Goiter - No neck compression symptoms  - 10/19/2014: thyroid ultrasound: Right thyroid lobe: 4.0 x 1.5 x 1.6 cm. Heterogeneous parenchyma with multiple small cysts identified. The largest measures approximately 0.5 x 0.3 x 0.5 cm. All of the small cysts in the right lobe appears simple and benign.  Left thyroid lobe: 4.5 x 1.4 x 2.0 cm. Heterogeneous parenchyma with multiple cysts. The largest measures approximately 1.0 x 0.4 x 0.5 cm. 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 Similar smaller cyst measures 0.8 cm in greatest diameter. There is a small solid nodule measuring  0.7 x 0.4 x 0.6 cm.  Isthmus Thickness: 0.4 cm. No nodules visualized.  Lymphadenopathy: None visualized.          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 No intervention needed.  3. HL  4.  Obesity  PLAN:  1. Patient with longstanding, previously uncontrolled diabetes, only on glipizide ER.  The HbA1c calculated from fructosamine was 7.0 at last visit, at goal.  This is usually lower than her directly measured HbA1c. -Her sugars started to improve significantly after stopping Micardis and starting hydralazine.  She complained about decreasing appetite.  She lost approximately 13 pounds in the last 6-8 months -At last visit she only had one slightly low in the upper 60s at that time and I advised her to let me know if she has more of these, but at that time, we did not change her regimen. -At this visit, per review of her sugar log, sugars are higher around the times when she had antibiotics, but otherwise they are still at goal.  Therefore, we decided not to change her regimen at this time.  I would like to see her back in 4 months with your sugar log and if sugars are higher then, we may need to intensify her regimen. - I uggested to:  Patient Instructions  Please continue: - Glipizide ER 5 mg before breakfast  Please return in 4 months with your sugar log.   - today, HbA1c is 8.7% (higher), but we will also check a fructosamine level since these are more accurate for her. - continue checking sugars at different times of the day - check 1x a day, rotating checks - advised for yearly eye exams >> she is UTD - Return to clinic in 4 mo with sugar log      2. Goiter -Denies neck compression symptoms -Reviewed latest ultrasound report from 10/2016: Only small thyroid cysts -Latest TSH was normal in 2018 -No further follow-up necessary unless she develops neck compression symptoms.  3. HL - Reviewed latest lipid panel  Lab Results  Component Value Date   CHOL 129 03/29/2018   HDL 46  03/29/2018   LDLCALC 69 03/29/2018   TRIG 71 03/29/2018   CHOLHDL 2.8 03/29/2018  - Continues Crestor low-dose every other day without side effects.  4.  Obesity -Continues to lose weight (6 pounds since last visit) -She previously lost 7 pounds before last visit due to decrease in appetite.  Office Visit on 08/24/2018  Component Date Value Ref Range Status  . Fructosamine 08/24/2018 376* 205 - 285 umol/L Final  . Hemoglobin A1C 08/24/2018 8.7* 4.0 - 5.6 % Final   HbA1c calculated from fructosamine is higher, at 8%, possibly 2/2 ABx >> will repeat at next visit.  Philemon Kingdom, MD PhD [facility] Endocrinology

## 2018-08-24 NOTE — Patient Instructions (Signed)
Please continue: - Glipizide ER 5 mg before breakfast  Please return in 4 months with your sugar log.

## 2018-08-26 LAB — FRUCTOSAMINE: Fructosamine: 376 umol/L — ABNORMAL HIGH (ref 205–285)

## 2018-08-30 ENCOUNTER — Telehealth: Payer: Self-pay

## 2018-08-30 NOTE — Telephone Encounter (Signed)
Notified patient of message from Dr. Gherghe, patient expressed understanding and agreement. No further questions.  

## 2018-08-30 NOTE — Telephone Encounter (Signed)
-----   Message from Philemon Kingdom, MD sent at 08/26/2018 12:30 PM EST ----- [name], can you please call pt: HbA1c calculated from fructosamine is higher, at 8%, possibly 2/2 ABx and the Holidays >> will repeat at next visit.

## 2018-09-06 ENCOUNTER — Other Ambulatory Visit: Payer: Self-pay | Admitting: Internal Medicine

## 2018-09-06 DIAGNOSIS — Z1231 Encounter for screening mammogram for malignant neoplasm of breast: Secondary | ICD-10-CM

## 2018-09-16 DIAGNOSIS — H3582 Retinal ischemia: Secondary | ICD-10-CM | POA: Diagnosis not present

## 2018-09-16 DIAGNOSIS — E113412 Type 2 diabetes mellitus with severe nonproliferative diabetic retinopathy with macular edema, left eye: Secondary | ICD-10-CM | POA: Diagnosis not present

## 2018-09-16 DIAGNOSIS — E113511 Type 2 diabetes mellitus with proliferative diabetic retinopathy with macular edema, right eye: Secondary | ICD-10-CM | POA: Diagnosis not present

## 2018-09-16 DIAGNOSIS — H31093 Other chorioretinal scars, bilateral: Secondary | ICD-10-CM | POA: Diagnosis not present

## 2018-09-16 DIAGNOSIS — H25813 Combined forms of age-related cataract, bilateral: Secondary | ICD-10-CM | POA: Diagnosis not present

## 2018-10-14 ENCOUNTER — Other Ambulatory Visit: Payer: Medicare Other

## 2018-10-18 DIAGNOSIS — E113511 Type 2 diabetes mellitus with proliferative diabetic retinopathy with macular edema, right eye: Secondary | ICD-10-CM | POA: Diagnosis not present

## 2018-10-18 DIAGNOSIS — E113412 Type 2 diabetes mellitus with severe nonproliferative diabetic retinopathy with macular edema, left eye: Secondary | ICD-10-CM | POA: Diagnosis not present

## 2018-11-02 ENCOUNTER — Ambulatory Visit: Payer: Medicare Other | Admitting: Internal Medicine

## 2018-11-02 ENCOUNTER — Telehealth: Payer: Self-pay | Admitting: Cardiovascular Disease

## 2018-11-02 NOTE — Telephone Encounter (Signed)
Spoke with patient who confirmed all demographics. Patient does no have a computer but does have a smart phone.  She declined My Chart. Will have vitals ready for visit.    Virtual Visit Pre-Appointment Phone Call  "(Name), I am calling you today to discuss your upcoming appointment. We are currently trying to limit exposure to the virus that causes COVID-19 by seeing patients at home rather than in the office."  1. "What is the BEST phone number to call the day of the visit?" - include this in appointment notes  2. Do you have or have access to (through a family member/friend) a smartphone with video capability that we can use for your visit?" a. If yes - list this number in appt notes as cell (if different from BEST phone #) and list the appointment type as a VIDEO visit in appointment notes b. If no - list the appointment type as a PHONE visit in appointment notes  Confirm consent - "In the setting of the current Covid19 crisis, you are scheduled for a (phone or video) visit with your provider on (date) at (time).  Just as we do with many in-office visits, in order for you to participate in this visit, we must obtain consent.  If you'd like, I can send this to your mychart (if signed up) or email for you to review.  Otherwise, I can obtain your verbal consent now.  All virtual visits are billed to your insurance company just like a normal visit would be.  By agreeing to a virtual visit, we'd like you to understand that the technology does not allow for your provider to perform an examination, and thus may limit your provider's ability to fully assess your condition. If your provider identifies any concerns that need to be evaluated in person, we will make arrangements to do so.  Finally, though the technology is pretty good, we cannot assure that it will always work on either your or our end, and in the setting of a video visit, we may have to convert it to a phone-only visit.  In either  situation, we cannot ensure that we have a secure connection.  Are you willing to proceed?"  Patient said "yes".  3. Advise patient to be prepared - "Two hours prior to your appointment, go ahead and check your blood pressure, pulse, oxygen saturation, and your weight (if you have the equipment to check those) and write them all down. When your visit starts, your provider will ask you for this information. If you have an Apple Watch or Kardia device, please plan to have heart rate information ready on the day of your appointment. Please have a pen and paper handy nearby the day of the visit as well."  4. Give patient instructions for MyChart download to smartphone OR Doximity/Doxy.me as below if video visit (depending on what platform provider is using)  5. Inform patient they will receive a phone call 15 minutes prior to their appointment time (may be from unknown caller ID) so they should be prepared to answer    TELEPHONE CALL NOTE  [name] has been deemed a candidate for a follow-up tele-health visit to limit community exposure during the Covid-19 pandemic. I spoke with the patient via phone to ensure availability of phone/video source, confirm preferred email & phone number, and discuss instructions and expectations.  I reminded [name] to be prepared with any vital sign and/or heart rhythm information that could potentially be obtained via  home monitoring, at the time of her visit. I reminded [name] to expect a phone call prior to her visit.  Ardelle Anton 11/02/2018 3:20 PM   INSTRUCTIONS FOR DOWNLOADING THE MYCHART APP TO SMARTPHONE  - The patient must first make sure to have activated MyChart and know their login information - If Apple, go to [employer] and type in MyChart in the search bar and download the app. If Android, ask patient to go to [employer] and type in [place] in the search bar and download the app. The app is free but as with  any other app downloads, their phone may require them to verify saved payment information or Apple/Android password.  - The patient will need to then log into the app with their MyChart username and password, and select [facility] as their healthcare provider to link the account. When it is time for your visit, go to the MyChart app, find appointments, and click Begin Video Visit. Be sure to Select Allow for your device to access the Microphone and Camera for your visit. You will then be connected, and your provider will be with you shortly.  **If they have any issues connecting, or need assistance please contact MyChart service desk [phone])**  **If using a computer, in order to ensure the best quality for their visit they will need to use either of the following Internet Browsers: [employer], or Google Chrome**  IF USING DOXIMITY or DOXY.ME - The patient will receive a link just prior to their visit by text.     FULL LENGTH CONSENT FOR TELE-HEALTH VISIT   I hereby voluntarily request, consent and authorize [facility] and its employed or contracted physicians, physician assistants, nurse practitioners or other licensed health care professionals (the Practitioner), to provide me with telemedicine health care services (the Services") as deemed necessary by the treating Practitioner. I acknowledge and consent to receive the Services by the Practitioner via telemedicine. I understand that the telemedicine visit will involve communicating with the Practitioner through live audiovisual communication technology and the disclosure of certain medical information by electronic transmission. I acknowledge that I have been given the opportunity to request an in-person assessment or other available alternative prior to the telemedicine visit and am voluntarily participating in the telemedicine visit.  I understand that I have the right to withhold or withdraw my consent to the use of  telemedicine in the course of my care at any time, without affecting my right to future care or treatment, and that the Practitioner or I may terminate the telemedicine visit at any time. I understand that I have the right to inspect all information obtained and/or recorded in the course of the telemedicine visit and may receive copies of available information for a reasonable fee.  I understand that some of the potential risks of receiving the Services via telemedicine include:   Delay or interruption in medical evaluation due to technological equipment failure or disruption;  Information transmitted may not be sufficient (e.g. poor resolution of images) to allow for appropriate medical decision making by the Practitioner; and/or   In rare instances, security protocols could fail, causing a breach of personal health information.  Furthermore, I acknowledge that it is my responsibility to provide information about my medical history, conditions and care that is complete and accurate to the best of my ability. I acknowledge that Practitioner's advice, recommendations, and/or decision may be based on factors not within their control, such as incomplete  or inaccurate data provided by me or distortions of diagnostic images or specimens that may result from electronic transmissions. I understand that the practice of medicine is not an exact science and that Practitioner makes no warranties or guarantees regarding treatment outcomes. I acknowledge that I will receive a copy of this consent concurrently upon execution via email to the email address I last provided but may also request a printed copy by calling the office of [facility].    I understand that my insurance will be billed for this visit.   I have read or had this consent read to me.  I understand the contents of this consent, which adequately explains the benefits and risks of the Services being provided via telemedicine.   I have been  provided ample opportunity to ask questions regarding this consent and the Services and have had my questions answered to my satisfaction.  I give my informed consent for the services to be provided through the use of telemedicine in my medical care  By participating in this telemedicine visit I agree to the above.

## 2018-11-08 ENCOUNTER — Other Ambulatory Visit: Payer: Self-pay

## 2018-11-09 ENCOUNTER — Encounter: Payer: Self-pay | Admitting: Obstetrics and Gynecology

## 2018-11-09 ENCOUNTER — Ambulatory Visit (INDEPENDENT_AMBULATORY_CARE_PROVIDER_SITE_OTHER): Payer: Medicare Other | Admitting: Obstetrics and Gynecology

## 2018-11-09 VITALS — BP 130/80 | HR 84 | Temp 98.2°F | Ht 62.0 in | Wt 194.0 lb

## 2018-11-09 DIAGNOSIS — L732 Hidradenitis suppurativa: Secondary | ICD-10-CM | POA: Diagnosis not present

## 2018-11-09 NOTE — Progress Notes (Signed)
GYNECOLOGY  VISIT   HPI: 68 y.o.   Single White or Caucasian Not Hispanic or Latino  female   [id number] with No LMP recorded. Patient is postmenopausal.   here for f/u of hidradenitis suppurativa, she was started on clindamycin gel in 2/20. She has only had one boil since starting the clindamycin. Overall doing well. She reports a h/o recurrent yeast, states she uses the clindamycin and the symptoms resolve.    GYNECOLOGIC HISTORY: No LMP recorded. Patient is postmenopausal. Contraception: PMP Menopausal hormone therapy: none        OB History    Gravida  0   Para  0   Term  0   Preterm  0   AB  0   Living  0     SAB  0   TAB  0   Ectopic  0   Multiple  0   Live Births  0              Patient Active Problem List   Diagnosis Date Noted  . Vaginal cysts 08/03/2018  . Hypertensive heart disease with congestive heart failure ([place]) 05/11/2018  . Boils 04/27/2018  . Edema 03/17/2017  . Rash and nonspecific skin eruption 12/08/2016  . CKD stage 4 due to type 2 diabetes mellitus ([place]) 09/24/2016  . Cough 04/13/2016  . Left shoulder pain 09/16/2015  . Allergic rhinitis 09/16/2015  . Goiter 10/19/2014  . Low back pain 11/13/2013  . Dizziness 11/13/2013  . Pruritus 08/01/2013  . Well adult exam 08/01/2013  . GERD (gastroesophageal reflux disease) 08/01/2013  . CAD (coronary artery disease) 06/11/2013  . Chronic combined systolic and diastolic CHF (congestive heart failure) ([place]) 06/11/2013  . Hypertension associated with diabetes ([place]) 06/07/2013  . Personal history of colon cancer   . Hyperlipidemia   . Obesity   . Disorder resulting from impaired renal function 12/19/2008  . LEG PAIN, BILATERAL 09/11/2008  . Acute sinusitis 06/13/2008  . COPD mixed type ([place] [facility]) 02/09/2008  . Controlled type 2 diabetes mellitus with circulatory disorder, without long-term current use of insulin ([place]) 10/13/2007  . Iron deficiency anemia 10/13/2007  . TOBACCO USE  DISORDER/SMOKER-SMOKING CESSATION DISCUSSED 10/13/2007  . WEIGHT LOSS, ABNORMAL 10/13/2007  . History of malignant neoplasm of large intestine 10/13/2007    Past Medical History:  Diagnosis Date  . Anemia   . COPD (chronic obstructive pulmonary disease) ([facility])   . Diabetes mellitus   . Hyperlipidemia   . Hypertension   . Ischemic cardiomyopathy 06/11/2013  . Low back pain   . NSTEMI (non-ST elevated myocardial infarction) ([place]) 06/11/2013  . Obesity   . Personal history of colon cancer   . Renal insufficiency     Past Surgical History:  Procedure Laterality Date  . COLECTOMY    . LEFT HEART CATHETERIZATION WITH CORONARY ANGIOGRAM N/A 06/09/2013   Procedure: LEFT HEART CATHETERIZATION WITH CORONARY ANGIOGRAM;  Surgeon: Ramond Dial, MD;  Location: [facility] CATH LAB;  Service: Cardiovascular;  Laterality: N/A;    Current Outpatient Medications  Medication Sig Dispense Refill  . amLODipine (NORVASC) 10 MG tablet Take 0.5 tablets (5 mg total) by mouth daily. 45 tablet 3  . aspirin 81 MG chewable tablet Chew 1 tablet (81 mg total) by mouth daily.    . betamethasone valerate ointment (VALISONE) 0.1 % Apply 1 application topically 2 (two) times daily. 15 g 0  . BYSTOLIC 10 MG tablet TAKE 1 TABLET BY MOUTH EVERY DAY 90 tablet 1  .  calcium carbonate (OS-CAL) 600 MG TABS tablet Take 600 mg by mouth 2 (two) times daily with a meal.    . clindamycin (CLINDAGEL) 1 % gel Apply topically 2 (two) times daily. Use for 2 months 30 g 2  . fluticasone (FLONASE) 50 MCG/ACT nasal spray Place 2 sprays into both nostrils daily as needed for allergies or rhinitis.    [name] glipiZIDE (GLUCOTROL XL) 5 MG 24 hr tablet Take 1 tablet (5 mg total) by mouth daily with breakfast. 90 tablet 3  . hydrALAZINE (APRESOLINE) 25 MG tablet Take 1 tablet (25 mg total) by mouth 3 (three) times daily. 270 tablet 3  . hydrOXYzine (ATARAX/VISTARIL) 25 MG tablet Take 1-2 tablets (25-50 mg total) by mouth every 8 (eight) hours as  needed for itching. 60 tablet 0  . loratadine (CLARITIN) 10 MG tablet Take 1 tablet (10 mg total) by mouth daily. 100 tablet 3  . potassium chloride SA (K-DUR,KLOR-CON) 20 MEQ tablet Take 1 tablet (20 mEq total) by mouth daily. 90 tablet 3  . rosuvastatin (CRESTOR) 5 MG tablet TAKE 1 TABLET BY MOUTH  DAILY AT 6PM 90 tablet 3  . torsemide (DEMADEX) 100 MG tablet Take 0.5-1 tablets (50-100 mg total) by mouth daily. 30 tablet 11  . vitamin B-12 (CYANOCOBALAMIN) 1000 MCG tablet Take 1,000 mcg by mouth daily.    . nitroGLYCERIN (NITROSTAT) 0.4 MG SL tablet DISSOLVE 1 TABLET UNDER THE TONGUE EVERY 5 MINUTES AS  NEEDED FOR CHEST PAIN (Patient not taking: Reported on 11/09/2018) 90 tablet 0  . ONE TOUCH ULTRA TEST test strip USE TO TEST BLOOD SUGAR TWO TIMES A DAY AS DIRECTED (Patient not taking: Reported on 11/09/2018) 100 each 11   No current facility-administered medications for this visit.      ALLERGIES: Tradjenta [linagliptin]; Atorvastatin; Enalapril maleate; Farxiga [dapagliflozin]; Hydrochlorothiazide; Kenalog [triamcinolone acetonide]; Metformin and related; Propoxyphene n-acetaminophen; Simvastatin; and Spironolactone  Family History  Problem Relation Age of Onset  . Hypertension Other     Social History   Socioeconomic History  . Marital status: Single    Spouse name: Not on file  . Number of children: Not on file  . Years of education: Not on file  . Highest education level: Not on file  Occupational History  . Not on file  Social Needs  . Financial resource strain: Not on file  . Food insecurity:    Worry: Not on file    Inability: Not on file  . Transportation needs:    Medical: Not on file    Non-medical: Not on file  Tobacco Use  . Smoking status: Light Tobacco Smoker    Types: Cigarettes  . Smokeless tobacco: Never Used  Substance and Sexual Activity  . Alcohol use: No  . Drug use: No  . Sexual activity: Not Currently    Birth control/protection: Post-menopausal   Lifestyle  . Physical activity:    Days per week: Not on file    Minutes per session: Not on file  . Stress: Not on file  Relationships  . Social connections:    Talks on phone: Not on file    Gets together: Not on file    Attends religious service: Not on file    Active member of club or organization: Not on file    Attends meetings of clubs or organizations: Not on file    Relationship status: Not on file  . Intimate partner violence:    Fear of current or ex partner: Not on file  Emotionally abused: Not on file    Physically abused: Not on file    Forced sexual activity: Not on file  Other Topics Concern  . Not on file  Social History Narrative  . Not on file    Review of Systems  Constitutional: Negative.   HENT: Negative.   Eyes: Negative.   Respiratory: Negative.   Cardiovascular: Negative.   Gastrointestinal: Negative.   Genitourinary: Negative.   Musculoskeletal: Negative.   Skin: Negative.   Neurological: Negative.   Endo/Heme/Allergies: Negative.   Psychiatric/Behavioral: Negative.   All other systems reviewed and are negative.   PHYSICAL EXAMINATION:    BP 130/80   Pulse 84   Temp 98.2 F (36.8 C) (Oral)   Ht 5\' 2"  (1.575 m)   Wt 194 lb (88 kg)   BMI 35.48 kg/m     General appearance: alert, cooperative and appears stated age  Pelvic: External genitalia:  no lesions, no boils              Urethra:  normal appearing urethra with no masses, tenderness or lesions              Bartholins and Skenes: normal                   Chaperone was present for exam.  ASSESSMENT H/O hidradenitis suppurativa, doing well with the clindamycin gel Patient reports frequent yeast infections. We discussed yeast and the increased risk with poorly controlled DM. We discussed if her symptoms improve with the clindamycin than it probably wasn't yeast    PLAN She has one more refill of the clindamycin which she has been using prn Will call with recurrent symptoms  or any c/o   An After Visit Summary was printed and given to the patient.

## 2018-11-15 ENCOUNTER — Other Ambulatory Visit: Payer: Self-pay

## 2018-11-15 ENCOUNTER — Telehealth (INDEPENDENT_AMBULATORY_CARE_PROVIDER_SITE_OTHER): Payer: Medicare Other | Admitting: Cardiovascular Disease

## 2018-11-15 ENCOUNTER — Encounter: Payer: Self-pay | Admitting: Cardiovascular Disease

## 2018-11-15 VITALS — BP 137/66 | HR 74 | Ht 62.0 in | Wt 193.2 lb

## 2018-11-15 DIAGNOSIS — E785 Hyperlipidemia, unspecified: Secondary | ICD-10-CM

## 2018-11-15 DIAGNOSIS — I5042 Chronic combined systolic (congestive) and diastolic (congestive) heart failure: Secondary | ICD-10-CM

## 2018-11-15 DIAGNOSIS — I2510 Atherosclerotic heart disease of native coronary artery without angina pectoris: Secondary | ICD-10-CM

## 2018-11-15 DIAGNOSIS — Z7189 Other specified counseling: Secondary | ICD-10-CM

## 2018-11-15 NOTE — Patient Instructions (Signed)

## 2018-11-15 NOTE — Progress Notes (Signed)
Virtual Visit via Video Note   This visit type was conducted due to national recommendations for restrictions regarding the COVID-19 Pandemic (e.g. social distancing) in an effort to limit this patient's exposure and mitigate transmission in our community.  Due to her co-morbid illnesses, this patient is at least at moderate risk for complications without adequate follow up.  This format is felt to be most appropriate for this patient at this time.  All issues noted in this document were discussed and addressed.  A limited physical exam was performed with this format.  Please refer to the patient's chart for her consent to telehealth for [facility].   Date:  11/15/2018   ID:  [name], DOB 1951/05/19, MRN [id number]  Patient Location: Home Provider Location: Home  PCP:  [name], Evie Lacks, MD  Cardiologist:  Mertie Moores, MD  Electrophysiologist:  None   Evaluation Performed:  Follow-Up Visit  Chief Complaint:  CAD, CHF  1. Coronary artery disease-status post PTCA and stenting of her mid LAD using a 3.5 x 15 mm Alpine stent ( DES) ( Dec. 5, 2014)  2. chronic systolic congestive heart failure-ejection fraction of 35% 2. Hypertension 3. COPD 4. Diabetes mellitus 5. History colon cancer    [name] is seen today. She was initially seen by Dr. Mare Ferrari in the hospital. She has a history of chronic systolic congestive heart failure with an ejection fraction of around 35%. She has a history of coronary artery disease. She is status post PTCA and stenting of her mid left anterior descending artery using a 3.5 x 15 mm Alpine stent. She has a history of COPD. [name] not had a cigarette since he left the hospital. She also has a history of diabetes mellitus and history of colon cancer  She has mild interscapular pain this morning . And the pain resolved after she drank some club soda. Did not feel like her MI pain.  She has been walking every morining at the mall  and at [place].   Her BP is up today. - she may have had lots of salt last night. The BP has remained high   September 20, 2013  [name] has been having some episodes of chest discomfort. She initially thought it might be due to indigestion or due to something that she ate. It radiated to her interscapular region. It was associated with some hand and normal tingling. The pain was intermittent but lasted for several hours. She took a sublingual nitroglycerin and the pain resolved very quickly and she did not have any further episodes the whole rest of the day.  She informed that she ran out of her Lasix 3 days ago.  November 01, 2013:  [name] is feeling well. She stopped smoking the day of her cath. She is eating more "penny candy" to replace the habit.   January 31, 2014:  She is doing ok. She has no angina. Not exercising. 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  She has a bruise on her leg where she bumped into a table.   Oct. 29, 2015:  Lyrical is doing very well. She is not having any episodes of chest pain. [name] not exercising quite as much as she would like. Stent was placed in December of 2014. She had a nose bleed last week. ( she is on Effient until Dec. 2015). Her Bp was A bit elevated because of some salt in her diet.  No CP or dyspnea    November 02, 2014:  SKYLYNNE [name] is a 68 y.o. female who presents for follow-up of her coronary artery disease. [name] done fairly well. She has had some muscle pain and a rash. She has occasional episodes of dizziness. She had some non-cardiac CP several weeks ago when she started Trajenta.  Stopped the med and has not had any further episodes of CP.    Oct. 26, 2016  [name] is doing well She had a echo recently   Left ventricle: The cavity size was normal. Wall thickness was increased in a pattern of mild LVH with focal basal septal hypertrophy. Systolic function was normal. The estimated ejection fraction  was in the range of 60% to 65%. Wall motion was normal; there were no regional wall motion abnormalities. Doppler parameters are consistent with abnormal left ventricular relaxation (grade 1 diastolic dysfunction). - Aortic valve: There was no stenosis. There was trivial regurgitation. - Mitral valve: Mildly calcified annulus. Mildly calcified leaflets . There was no significant regurgitation. - Right ventricle: The cavity size was normal. Systolic function was normal. - Tricuspid valve: Peak RV-RA gradient (S): 21 mm Hg. - Pulmonary arteries: PA peak pressure: 24 mm Hg (S). - Inferior vena cava: The vessel was normal in size. The respirophasic diameter changes were in the normal range (>= 50%), consistent with normal central venous pressure  [name] is doing well  BP has been elevated for the past 2 weeks.  Has had some left arm pain / ached Left arm aches all the time,  Constant.  Worse with sitting, not worsened by exercise .   October 28, 2015:  [name] is doing well.  She went with [facility] - it took 2 weeks for them to get her meds to her .  Has chronic left arm pain .  Oct. 23, 2017:  Doing well from a cardiac standpoint Having eye surgery . Has had a chronic cough [name], Evie Lacks, MD changed her Coreg to [facility] Did not seem to help the cough   Jan. 24, 2018:  doing well. Still eating some salty foods.  Still smoking some,  Advised her to quit.  Echo from 2016 shows normal LV systolic function Needs to have an contrast imaging study of her eye. She is at low risk for that procedure.   Aug. 17, 2018:  [name] is seen back today for follow up of her CHF   Dec 03, 2017:    [name] is seen back for follow up of her CHF and HTN Doing well. EF is now normal.   Gets some exercise - not as much as she would like  Has some ankle swelling  Has had some arm pain that she associated with the Losartan ( MSK pain is mentioned in [place])   Nov.  6, 2019:  [name] is seen for follow up visit  BP is well controlled  Had an episodes of CP while very upset ( her brother had just had a aortic dissection ( 10.5 hours of surgery )  Brother is 60 yo .    No further episodes of CP after the stress  Has a rash on the left side of the chest  She was not tolerating the micardis ( cough)  She started taking hydralazine on September 26.  She now has developed a rash on the left side of her chest.  She was started on torsemide instead of furosemide by her primary medical doctor.  She does okay when she takes the 50 mg of torsemide.  When she takes 100 mg of torsemide she has severe muscle cramping.  Nov 14, 2018   AMEKA KRIGBAUM is a 68 y.o. female with CAD, CHF. Feeling well  Breathing is ok Had some breathing is issues in Dec. And January  Went to the ER but it was packed so she left Had removal of a cyst in her perineium in Jan.  No CP,   Exercises, walking some  Getting some labs tomorrow   The patient does not have symptoms concerning for COVID-19 infection (fever, chills, cough, or new shortness of breath).    Past Medical History:  Diagnosis Date  . Anemia   . COPD (chronic obstructive pulmonary disease) ([place])   . Diabetes mellitus   . Hyperlipidemia   . Hypertension   . Ischemic cardiomyopathy 06/11/2013  . Low back pain   . NSTEMI (non-ST elevated myocardial infarction) ([place]) 06/11/2013  . Obesity   . Personal history of colon cancer   . Renal insufficiency    Past Surgical History:  Procedure Laterality Date  . COLECTOMY    . LEFT HEART CATHETERIZATION WITH CORONARY ANGIOGRAM N/A 06/09/2013   Procedure: LEFT HEART CATHETERIZATION WITH CORONARY ANGIOGRAM;  Surgeon: Ramond Dial, MD;  Location: [facility] C[facility] LAB;  Service: Cardiovascular;  Laterality: N/A;     Current Meds  Medication Sig  . amLODipine (NORVASC) 10 MG tablet Take 0.5 tablets (5 mg total) by mouth daily.  [name] aspirin 81 MG chewable tablet  Chew 1 tablet (81 mg total) by mouth daily.  . betamethasone valerate ointment (VALISONE) 0.1 % Apply 1 application topically 2 (two) times daily.  [name] BYSTOLIC 10 MG tablet TAKE 1 TABLET BY MOUTH EVERY DAY  . calcium carbonate (OS-CAL) 600 MG TABS tablet Take 600 mg by mouth 2 (two) times daily with a meal.  . clindamycin (CLINDAGEL) 1 % gel Apply topically 2 (two) times daily. Use for 2 months  . fluticasone (FLONASE) 50 MCG/ACT nasal spray Place 2 sprays into both nostrils daily as needed for allergies or rhinitis.  [name] glipiZIDE (GLUCOTROL XL) 5 MG 24 hr tablet Take 1 tablet (5 mg total) by mouth daily with breakfast.  . hydrALAZINE (APRESOLINE) 25 MG tablet Take 1 tablet (25 mg total) by mouth 3 (three) times daily.  . hydrOXYzine (ATARAX/VISTARIL) 25 MG tablet Take 1-2 tablets (25-50 mg total) by mouth every 8 (eight) hours as needed for itching.  . nitroGLYCERIN (NITROSTAT) 0.4 MG SL tablet DISSOLVE 1 TABLET UNDER THE TONGUE EVERY 5 MINUTES AS  NEEDED FOR CHEST PAIN  . ONE TOUCH ULTRA TEST test strip USE TO TEST BLOOD SUGAR TWO TIMES A DAY AS DIRECTED  . potassium chloride SA (K-DUR,KLOR-CON) 20 MEQ tablet Take 1 tablet (20 mEq total) by mouth daily.  . rosuvastatin (CRESTOR) 5 MG tablet TAKE 1 TABLET BY MOUTH  DAILY AT 6PM  . torsemide (DEMADEX) 100 MG tablet Take 0.5-1 tablets (50-100 mg total) by mouth daily.  . vitamin B-12 (CYANOCOBALAMIN) 1000 MCG tablet Take 1,000 mcg by mouth daily.     Allergies:   Tradjenta [linagliptin]; Atorvastatin; Enalapril maleate; Farxiga [dapagliflozin]; Hydrochlorothiazide; Kenalog [triamcinolone acetonide]; Metformin and related; Propoxyphene n-acetaminophen; Simvastatin; and Spironolactone   Social History   Tobacco Use  . Smoking status: Light Tobacco Smoker    Types: Cigarettes  . Smokeless tobacco: Never Used  Substance Use Topics  . Alcohol use: No  . Drug use: No     Family Hx: The patient's family history includes Hypertension in  an other  family member.  ROS:   Please see the history of present illness.     All other systems reviewed and are negative.   Prior CV studies:   The following studies were reviewed today:    Labs/Other Tests and Data Reviewed:    EKG:  No ECG reviewed.  Recent Labs: 03/29/2018: ALT 10; BUN 41; Creatinine, Ser 2.73; Potassium 4.9; Sodium 138   Recent Lipid Panel Lab Results  Component Value Date/Time   CHOL 129 03/29/2018 10:42 AM   TRIG 71 03/29/2018 10:42 AM   HDL 46 03/29/2018 10:42 AM   CHOLHDL 2.8 03/29/2018 10:42 AM   CHOLHDL 2 08/17/2017 11:24 AM   LDLCALC 69 03/29/2018 10:42 AM    Wt Readings from Last 3 Encounters:  11/15/18 193 lb 3.2 oz (87.6 kg)  11/09/18 194 lb (88 kg)  08/24/18 196 lb (88.9 kg)     Objective:    Vital Signs:  BP 137/66 (BP Location: Left Arm, Patient Position: Sitting, Cuff Size: Normal)   Pulse 74   Ht 5\' 2"  (1.575 m)   Wt 193 lb 3.2 oz (87.6 kg)   BMI 35.34 kg/m    VITAL SIGNS:  reviewed GEN:  no acute distress EYES:  sclerae anicteric, EOMI - Extraocular Movements Intact RESPIRATORY:  normal respiratory effort, symmetric expansion CARDIOVASCULAR:  no peripheral edema SKIN:  no rash, lesions or ulcers. MUSCULOSKELETAL:  no obvious deformities. NEURO:  alert and oriented x 3, no obvious focal deficit PSYCH:  normal affect  ASSESSMENT & PLAN:    1. CAD:   No angina .  Doing well   2.  Chronic systolic CHF:  Stable,   Also has CKD - Cr = 2.73.     3.   Chronic kidney disease:  Creatinine is 2.7.   Getting a recheck tomorrow   Will see her in 6 months  COVID-19 Education: The signs and symptoms of COVID-19 were discussed with the patient and how to seek care for testing (follow up with PCP or arrange E-visit).  The importance of social distancing was discussed today.  Time:   Today, I have spent  25  minutes with the patient with telehealth technology discussing the above problems.     Medication Adjustments/Labs and Tests  Ordered: Current medicines are reviewed at length with the patient today.  Concerns regarding medicines are outlined above.   Tests Ordered: No orders of the defined types were placed in this encounter.   Medication Changes: No orders of the defined types were placed in this encounter.   Disposition:  Follow up in 6 month(s)  Signed, Mertie Moores, MD  11/15/2018 9:15 AM    [place]

## 2018-11-16 DIAGNOSIS — E1122 Type 2 diabetes mellitus with diabetic chronic kidney disease: Secondary | ICD-10-CM | POA: Diagnosis not present

## 2018-11-16 DIAGNOSIS — N2581 Secondary hyperparathyroidism of renal origin: Secondary | ICD-10-CM | POA: Diagnosis not present

## 2018-11-16 DIAGNOSIS — N184 Chronic kidney disease, stage 4 (severe): Secondary | ICD-10-CM | POA: Diagnosis not present

## 2018-11-16 DIAGNOSIS — I129 Hypertensive chronic kidney disease with stage 1 through stage 4 chronic kidney disease, or unspecified chronic kidney disease: Secondary | ICD-10-CM | POA: Diagnosis not present

## 2018-11-16 DIAGNOSIS — D631 Anemia in chronic kidney disease: Secondary | ICD-10-CM | POA: Diagnosis not present

## 2018-11-22 ENCOUNTER — Other Ambulatory Visit: Payer: Self-pay | Admitting: Internal Medicine

## 2018-11-22 ENCOUNTER — Telehealth: Payer: Self-pay | Admitting: Internal Medicine

## 2018-11-22 NOTE — Telephone Encounter (Signed)
RX sent

## 2018-11-22 NOTE — Telephone Encounter (Signed)
Copied from [place] [id number]. Topic: Quick Communication - Rx Refill/Question >> Nov 22, 2018 12:24 PM Oneta Rack wrote: Medication: torsemide (DEMADEX) 100 MG tablet   (patient would like to pick up medication tomorrow)    Preferred Pharmacy (with phone number or street name):  [id number] [address], [place]. [phone] (Phone) [phone] (Fax)    Agent: Please be advised that RX refills may take up to 3 business days. We ask that you follow-up with your pharmacy.

## 2018-11-23 DIAGNOSIS — N184 Chronic kidney disease, stage 4 (severe): Secondary | ICD-10-CM | POA: Diagnosis not present

## 2018-11-23 DIAGNOSIS — N2581 Secondary hyperparathyroidism of renal origin: Secondary | ICD-10-CM | POA: Diagnosis not present

## 2018-11-23 DIAGNOSIS — N189 Chronic kidney disease, unspecified: Secondary | ICD-10-CM | POA: Diagnosis not present

## 2018-12-19 ENCOUNTER — Ambulatory Visit
Admission: RE | Admit: 2018-12-19 | Discharge: 2018-12-19 | Disposition: A | Discharge status: Home / Self Care | Payer: Medicare Other | Source: Ambulatory Visit | Attending: Internal Medicine | Admitting: Internal Medicine

## 2018-12-19 ENCOUNTER — Other Ambulatory Visit: Payer: Self-pay

## 2018-12-19 ENCOUNTER — Ambulatory Visit
Admission: RE | Admit: 2018-12-19 | Discharge: 2018-12-19 | Disposition: A | Discharge status: Home / Self Care | Payer: Medicare Other | Source: Ambulatory Visit | Attending: Obstetrics and Gynecology | Admitting: Obstetrics and Gynecology

## 2018-12-19 DIAGNOSIS — Z1231 Encounter for screening mammogram for malignant neoplasm of breast: Secondary | ICD-10-CM | POA: Diagnosis not present

## 2018-12-19 DIAGNOSIS — Z780 Asymptomatic menopausal state: Secondary | ICD-10-CM | POA: Diagnosis not present

## 2018-12-19 DIAGNOSIS — E2839 Other primary ovarian failure: Secondary | ICD-10-CM

## 2018-12-19 DIAGNOSIS — Z13820 Encounter for screening for osteoporosis: Secondary | ICD-10-CM | POA: Diagnosis not present

## 2018-12-22 ENCOUNTER — Other Ambulatory Visit: Payer: Self-pay | Admitting: Internal Medicine

## 2018-12-28 ENCOUNTER — Other Ambulatory Visit: Payer: Self-pay

## 2018-12-28 DIAGNOSIS — H3582 Retinal ischemia: Secondary | ICD-10-CM | POA: Diagnosis not present

## 2018-12-28 DIAGNOSIS — H31093 Other chorioretinal scars, bilateral: Secondary | ICD-10-CM | POA: Diagnosis not present

## 2018-12-28 DIAGNOSIS — H2513 Age-related nuclear cataract, bilateral: Secondary | ICD-10-CM | POA: Diagnosis not present

## 2018-12-28 DIAGNOSIS — E113512 Type 2 diabetes mellitus with proliferative diabetic retinopathy with macular edema, left eye: Secondary | ICD-10-CM | POA: Diagnosis not present

## 2018-12-30 ENCOUNTER — Ambulatory Visit (INDEPENDENT_AMBULATORY_CARE_PROVIDER_SITE_OTHER): Payer: Medicare Other | Admitting: Internal Medicine

## 2018-12-30 ENCOUNTER — Other Ambulatory Visit: Payer: Self-pay

## 2018-12-30 ENCOUNTER — Encounter: Payer: Self-pay | Admitting: Internal Medicine

## 2018-12-30 VITALS — BP 138/70 | HR 77 | Ht 62.0 in | Wt 197.0 lb

## 2018-12-30 DIAGNOSIS — E785 Hyperlipidemia, unspecified: Secondary | ICD-10-CM

## 2018-12-30 DIAGNOSIS — Z6837 Body mass index (BMI) 37.0-37.9, adult: Secondary | ICD-10-CM

## 2018-12-30 DIAGNOSIS — E1159 Type 2 diabetes mellitus with other circulatory complications: Secondary | ICD-10-CM

## 2018-12-30 LAB — POCT GLYCOSYLATED HEMOGLOBIN (HGB A1C): Hemoglobin A1C: 9 % — AB (ref 4.0–5.6)

## 2018-12-30 MED ORDER — GLIPIZIDE ER 5 MG PO TB24: 5.0000 mg | ORAL_TABLET | Freq: Every day | ORAL | 3 refills | Status: DC

## 2018-12-30 NOTE — Patient Instructions (Signed)
Please continue: - Glipizide ER 5 mg before breakfast  Stop juice!  Please return in 4 months with your sugar log.

## 2018-12-30 NOTE — Progress Notes (Signed)
Patient ID: [name], female   DOB: 1951-02-16, 68 y.o.   MRN: [id number]  HPI: [name] is a 68 y.o.-year-old female, returning for f/u DM2, dx in 2006, insulin-independent, controlled, with complications (CAD - s/p AMI 06/2013, CHF; CKD; PN; DR). Last visit 4 months ago.  Last hemoglobin A1c was: 08/24/2018: HbA1c calculated from fructosamine is higher, at 8%, possibly 2/2 ABx  04/20/2018: HbA1c calculated from fructosamine is slightly better than before, at 7%. 12/15/2017: HbA1c calculated from fructosamine: 7.17%, slightly improved from last visit  08/17/2017: HbA1c calculated from fructosamine: 7.2%, slightly improved. 04/15/2017: HbA1c calculated from the fructosamine is lower than measured, at 7.3% Lab Results  Component Value Date   HGBA1C 8.7 (A) 08/24/2018   HGBA1C 8.1 (A) 04/20/2018   HGBA1C 9.3 (H) 10/26/2017  08/21/2016: HbA1c 6.0%.  She refused insulin in the past. She started to change her diet after her hemoglobin A1c returned high, at 16.2% in 09/2014. She cut down portions, changed the meal content to include more fiber and less carbs.  HbA1c decreased dramatically.  Pt is on: - Glipizide ER 5 mg in a.m.- started 2016 She stopped metformin ER in 01/2017. She tried Tradjenta 5 mg daily in am >> CP with it (started 10/2014) >> had to stop - she did see her cardiologist since then She tried [place] >> decreased [name] and yeast inf. She tried regular metformin >> diarrhea  Pt checks her sugars twice a day - per review of her log: - am:  97-133 >> 87-124, 137 >> 78, 85-136, 141 - 2h after b'fast: 107-141, 152 >> 99-130, 140 - before lunch: 131-143, 153 >> 127-159, 168 >> 132-159, 168 (snack) - 2h after lunch: 133-155 >> 111, 135-166 >> 133-182 - before dinner: 100-136 >> 89-168, 171 >> 68, 132-171 - 2h after dinner: 68-149 >> 120-170, 170 >> 120-177 - bedtime:   n/c >> 137 >> n/c >> 147-157 - nighttime: n/c Lowest sugar was 87 >> 68 >> 70 x1 >> 60; it is  unclear at which level she has hypoglycemia awareness. Highest sugar was  270 - colonoscopy >> 178 >> 275 >> 182.  Glucometer: One Touch Ultra  Pt's meals are: - Breakfast: oatmeal, cereals, [place] bacon, egg toast - Lunch: sandwich or fruit salad - Dinner: salmon/chicken, Brussel sprouts, other veggies, sweet potatoes or brown rice, spaghetti - Snacks: 2: carrots with dip; yoghurt with fruit, veggies + dip She walks for exercise. She saw Dr. Hollie Salk with nephrology.  She was started on a high potassium low protein diet. She started drinking pomegranate juice  - 1 cup TID!  -+ CKD, last BUN/creatinine -she was referred to nephrology: Lab Results  Component Value Date   BUN 41 (H) 03/29/2018   CREATININE 2.73 (H) 03/29/2018  She stopped olmesartan due to worsening kidney function and cough. -+ HL; last set of lipids: Lab Results  Component Value Date   CHOL 129 03/29/2018   HDL 46 03/29/2018   LDLCALC 69 03/29/2018   TRIG 71 03/29/2018   CHOLHDL 2.8 03/29/2018  On Crestor 5 every other day. - last eye exam was in 03/2018: + DR, + cataract.  Had laser surgery. Dr. Posey Pronto. -+ Numbness and tingling in her feet.  She also has a history of HTN, GERD, and anemia.  She also has a history of goiter.  Thyroid ultrasound showed only small cysts, no nodules.  Pt denies: - feeling nodules in neck - hoarseness - dysphagia - choking - SOB with lying  down  Latest TSH was normal: Lab Results  Component Value Date   TSH 1.63 06/25/2017   ROS: Constitutional: no weight gain/no weight loss, no fatigue, no subjective hyperthermia, no subjective hypothermia, + nocturia Eyes: no blurry vision, no xerophthalmia ENT: no sore throat, + see HPI Cardiovascular: no CP/no SOB/no palpitations/no leg swelling Respiratory: no cough/no SOB/no wheezing Gastrointestinal: no N/no V/no D/+ C/no acid reflux Musculoskeletal:+ mm cramps/+ joint aches Skin: no rashes, no hair loss Neurological: no  tremors/+ numbness/+ tingling/no dizziness  I reviewed pt's medications, allergies, PMH, social hx, family hx, and changes were documented in the history of present illness. Otherwise, unchanged from my initial visit note.  Past Medical History:  Diagnosis Date  . Anemia   . COPD (chronic obstructive pulmonary disease) ([place])   . Diabetes mellitus   . Hyperlipidemia   . Hypertension   . Ischemic cardiomyopathy 06/11/2013  . Low back pain   . NSTEMI (non-ST elevated myocardial infarction) ([place]) 06/11/2013  . Obesity   . Personal history of colon cancer   . Renal insufficiency    Past Surgical History:  Procedure Laterality Date  . COLECTOMY    . LEFT HEART CATHETERIZATION WITH CORONARY ANGIOGRAM N/A 06/09/2013   Procedure: LEFT HEART CATHETERIZATION WITH CORONARY ANGIOGRAM;  Surgeon: Ramond Dial, MD;  Location: [facility] CATH LAB;  Service: Cardiovascular;  Laterality: N/A;   History   Social History  . Marital Status: Single    Spouse Name: N/A  . Number of Children: 0   Occupational History  . retired   Social History Main Topics  . Smoking status:  former smoker     Types: Cigarettes  . Smokeless tobacco: Never Used  . Alcohol Use: No  . Drug Use: No   Current Outpatient Medications on File Prior to Visit  Medication Sig Dispense Refill  . amLODipine (NORVASC) 10 MG tablet Take 0.5 tablets (5 mg total) by mouth daily. 45 tablet 3  . aspirin 81 MG chewable tablet Chew 1 tablet (81 mg total) by mouth daily.    . betamethasone valerate ointment (VALISONE) 0.1 % Apply 1 application topically 2 (two) times daily. 15 g 0  . BYSTOLIC 10 MG tablet TAKE 1 TABLET BY MOUTH EVERY DAY 90 tablet 1  . calcium carbonate (OS-CAL) 600 MG TABS tablet Take 600 mg by mouth 2 (two) times daily with a meal.    . clindamycin (CLINDAGEL) 1 % gel Apply topically 2 (two) times daily. Use for 2 months 30 g 2  . fluticasone (FLONASE) 50 MCG/ACT nasal spray Place 2 sprays into both nostrils daily as  needed for allergies or rhinitis.    [name] glipiZIDE (GLUCOTROL XL) 5 MG 24 hr tablet Take 1 tablet (5 mg total) by mouth daily with breakfast. 90 tablet 3  . hydrALAZINE (APRESOLINE) 25 MG tablet Take 1 tablet (25 mg total) by mouth 3 (three) times daily. 270 tablet 3  . hydrOXYzine (ATARAX/VISTARIL) 25 MG tablet Take 1-2 tablets (25-50 mg total) by mouth every 8 (eight) hours as needed for itching. 60 tablet 0  . nitroGLYCERIN (NITROSTAT) 0.4 MG SL tablet DISSOLVE 1 TABLET UNDER THE TONGUE EVERY 5 MINUTES AS  NEEDED FOR CHEST PAIN 90 tablet 0  . ONE TOUCH ULTRA TEST test strip USE TO TEST BLOOD SUGAR TWO TIMES A DAY AS DIRECTED 100 each 11  . potassium chloride SA (K-DUR,KLOR-CON) 20 MEQ tablet Take 1 tablet (20 mEq total) by mouth daily. 90 tablet 3  . rosuvastatin (  CRESTOR) 5 MG tablet TAKE 1 TABLET BY MOUTH  DAILY AT 6PM 90 tablet 3  . torsemide (DEMADEX) 100 MG tablet TAKE 0.5-1 TABLETS (50-100 MG TOTAL) BY MOUTH DAILY. 90 tablet 3  . vitamin B-12 (CYANOCOBALAMIN) 1000 MCG tablet Take 1,000 mcg by mouth daily.     No current facility-administered medications on file prior to visit.    Allergies  Allergen Reactions  . Tradjenta [Linagliptin] Anaphylaxis    CP  . Atorvastatin     REACTION: aches and pains  . Enalapril Maleate     REACTION: cough  . Farxiga [Dapagliflozin] Itching  . Hydrochlorothiazide     REACTION: hair loss  . Kenalog [Triamcinolone Acetonide]     HANDS NUMB   . Metformin And Related     Diarrhea, dizziness  . Propoxyphene N-Acetaminophen Hives  . Simvastatin     REACTION: cramps  . Spironolactone     REACTION: cramps   Family History  Problem Relation Age of Onset  . Hypertension Other    PE: BP 138/70   Pulse 77   Ht 5\' 2"  (1.575 m)   Wt 197 lb (89.4 kg)   SpO2 98%   BMI 36.03 kg/m  Body mass index is 36.03 kg/m. Wt Readings from Last 3 Encounters:  12/30/18 197 lb (89.4 kg)  11/15/18 193 lb 3.2 oz (87.6 kg)  11/09/18 194 lb (88 kg)    Constitutional: overweight, in NAD Eyes: PERRLA, EOMI, no exophthalmos ENT: moist mucous membranes, no thyromegaly, no cervical lymphadenopathy Cardiovascular: RRR, No RG, +1/6 SEM, + L>R LE edema Respiratory: CTA B Gastrointestinal: abdomen soft, NT, ND, BS+ Musculoskeletal: no deformities, strength intact in all 4 Skin: moist, warm, no rashes Neurological: no tremor with outstretched hands, DTR normal in all 4  ASSESSMENT: 1. DM2, insulin-independent, controlled, with complications - CAD, s/p AMI 06/2013 - Dr Acie Fredrickson - CHF - CKD - PN - DR - Dr. Jalene Mullet ([facility]) - on IO injections >> improving  2. Goiter - No neck compression symptoms  - 10/19/2014: thyroid ultrasound: Right thyroid lobe: 4.0 x 1.5 x 1.6 cm. Heterogeneous parenchyma with multiple small cysts identified. The largest measures approximately 0.5 x 0.3 x 0.5 cm. All of the small cysts in the right lobe appears simple and benign.  Left thyroid lobe: 4.5 x 1.4 x 2.0 cm. Heterogeneous parenchyma with multiple cysts. The largest measures approximately 1.0 x 0.4 x 0.5 cm. 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 Similar smaller cyst measures 0.8 cm in greatest diameter. There is a small solid nodule measuring 0.7 x 0.4 x 0.6 cm.  Isthmus Thickness: 0.4 cm. No nodules visualized.  Lymphadenopathy: None visualized.          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 No intervention needed.  3. HL  4.  Obesity  PLAN:  1. Patient with longstanding, previously uncontrolled diabetes, only on glipizide ER.  We are usually following her with fructosamine levels, since her directly measured HbA1c levels are not very accurate for her.  We occasionally check these just to follow trends.  Before last visit, she was starting to improve significantly after stopping myocarditis and starting hydralazine.  She also lost weight.  At last visit, sugars were higher around the times when she had  antibiotics but they were otherwise at goal.  We did not change her regimen then.  HbA1c directly measured was 8%.  We did not check a fructosamine at that time. -At this visit, sugars appear higher  and upon questioning, she started to drink juice, 1 cup 3 times a day.  I strongly advised her to stop this right away and not drink any juice.  She cannot eat fruit instead.  She feels that the juice is helping with her muscle cramps.  She is on a diuretic but does take a potassium supplement.  I suggested to take a magnesium supplement 400 to 500 mg daily, also.  Also, fruit like blueberries have plenty of potassium and do not raise her sugar significantly.  Otherwise, we will continue on the current regimen for now. - I suggested to:  Patient Instructions  Please continue: - Glipizide ER 5 mg before breakfast  Please return in 4 months with your sugar log.   - today, HbA1c is 7%  -we will also check a fructosamine level - continue checking sugars at different times of the day - check 1x a day, rotating checks - advised for yearly eye exams >> she is UTD - Return to clinic in 4 mo with sugar log      2. Goiter -No neck compression symptoms. -Reviewed latest thyroid ultrasound report from 10/2016: Only small thyroid cysts -Latest TSH was normal in 2018 -No further follow-up necessary unless she develops neck compression symptoms  3. HL - Reviewed latest lipid panel from 2019: All fractions at goal Lab Results  Component Value Date   CHOL 129 03/29/2018   HDL 46 03/29/2018   LDLCALC 69 03/29/2018   TRIG 71 03/29/2018   CHOLHDL 2.8 03/29/2018  - Continues low-dose Crestor (5 mg every other day) without side effects.  Office Visit on 12/30/2018  Component Date Value Ref Range Status  . Hemoglobin A1C 12/30/2018 9.0* 4.0 - 5.6 % Final  . Fructosamine 12/30/2018 389* 205 - 285 umol/L Final   HbA1c calculated from fructosamine is higher, at 8.2%.  Stopping juice will definitely help.    Philemon Kingdom, MD PhD [facility]

## 2019-01-03 LAB — FRUCTOSAMINE: Fructosamine: 389 umol/L — ABNORMAL HIGH (ref 205–285)

## 2019-01-10 ENCOUNTER — Other Ambulatory Visit (INDEPENDENT_AMBULATORY_CARE_PROVIDER_SITE_OTHER): Payer: Medicare Other

## 2019-01-10 ENCOUNTER — Ambulatory Visit (INDEPENDENT_AMBULATORY_CARE_PROVIDER_SITE_OTHER): Payer: Medicare Other | Admitting: Internal Medicine

## 2019-01-10 ENCOUNTER — Encounter: Payer: Self-pay | Admitting: Internal Medicine

## 2019-01-10 ENCOUNTER — Other Ambulatory Visit: Payer: Self-pay

## 2019-01-10 VITALS — BP 134/72 | HR 85 | Temp 97.9°F | Ht 62.0 in | Wt 195.0 lb

## 2019-01-10 DIAGNOSIS — E1159 Type 2 diabetes mellitus with other circulatory complications: Secondary | ICD-10-CM

## 2019-01-10 DIAGNOSIS — R600 Localized edema: Secondary | ICD-10-CM

## 2019-01-10 DIAGNOSIS — D509 Iron deficiency anemia, unspecified: Secondary | ICD-10-CM

## 2019-01-10 DIAGNOSIS — N184 Chronic kidney disease, stage 4 (severe): Secondary | ICD-10-CM

## 2019-01-10 DIAGNOSIS — G8929 Other chronic pain: Secondary | ICD-10-CM

## 2019-01-10 DIAGNOSIS — E1122 Type 2 diabetes mellitus with diabetic chronic kidney disease: Secondary | ICD-10-CM

## 2019-01-10 DIAGNOSIS — E785 Hyperlipidemia, unspecified: Secondary | ICD-10-CM | POA: Diagnosis not present

## 2019-01-10 DIAGNOSIS — M5440 Lumbago with sciatica, unspecified side: Secondary | ICD-10-CM

## 2019-01-10 DIAGNOSIS — I2510 Atherosclerotic heart disease of native coronary artery without angina pectoris: Secondary | ICD-10-CM | POA: Diagnosis not present

## 2019-01-10 LAB — HEPATIC FUNCTION PANEL
ALT: 12 U/L (ref 0–35)
AST: 12 U/L (ref 0–37)
Albumin: 4.4 g/dL (ref 3.5–5.2)
Alkaline Phosphatase: 112 U/L (ref 39–117)
Bilirubin, Direct: 0 mg/dL (ref 0.0–0.3)
Total Bilirubin: 0.4 mg/dL (ref 0.2–1.2)
Total Protein: 7.8 g/dL (ref 6.0–8.3)

## 2019-01-10 LAB — CBC WITH DIFFERENTIAL/PLATELET
Basophils Absolute: 0 10*3/uL (ref 0.0–0.1)
Basophils Relative: 0.5 % (ref 0.0–3.0)
Eosinophils Absolute: 0.1 10*3/uL (ref 0.0–0.7)
Eosinophils Relative: 1.6 % (ref 0.0–5.0)
HCT: 33.3 % — ABNORMAL LOW (ref 36.0–46.0)
Hemoglobin: 10.9 g/dL — ABNORMAL LOW (ref 12.0–15.0)
Lymphocytes Relative: 18.9 % (ref 12.0–46.0)
Lymphs Abs: 0.9 10*3/uL (ref 0.7–4.0)
MCHC: 32.8 g/dL (ref 30.0–36.0)
MCV: 91.4 fl (ref 78.0–100.0)
Monocytes Absolute: 0.5 10*3/uL (ref 0.1–1.0)
Monocytes Relative: 10.2 % (ref 3.0–12.0)
Neutro Abs: 3.4 10*3/uL (ref 1.4–7.7)
Neutrophils Relative %: 68.8 % (ref 43.0–77.0)
Platelets: 138 10*3/uL — ABNORMAL LOW (ref 150.0–400.0)
RBC: 3.65 Mil/uL — ABNORMAL LOW (ref 3.87–5.11)
RDW: 14.4 % (ref 11.5–15.5)
WBC: 5 10*3/uL (ref 4.0–10.5)

## 2019-01-10 LAB — BASIC METABOLIC PANEL
BUN: 68 mg/dL — ABNORMAL HIGH (ref 6–23)
CO2: 26 mEq/L (ref 19–32)
Calcium: 9.6 mg/dL (ref 8.4–10.5)
Chloride: 104 mEq/L (ref 96–112)
Creatinine, Ser: 2.6 mg/dL — ABNORMAL HIGH (ref 0.40–1.20)
GFR: 18.29 mL/min — ABNORMAL LOW (ref >60.00)
Glucose, Bld: 184 mg/dL — ABNORMAL HIGH (ref 70–99)
Potassium: 4.4 mEq/L (ref 3.5–5.1)
Sodium: 140 mEq/L (ref 135–145)

## 2019-01-10 LAB — LIPID PANEL
Cholesterol: 122 mg/dL (ref 0–200)
HDL: 46.4 mg/dL (ref >39.00)
LDL Cholesterol: 59 mg/dL (ref 0–99)
NonHDL: 75.3
Total CHOL/HDL Ratio: 3
Triglycerides: 80 mg/dL (ref 0.0–149.0)
VLDL: 16 mg/dL (ref 0.0–40.0)

## 2019-01-10 LAB — TSH: TSH: 1.88 u[IU]/mL (ref 0.35–4.50)

## 2019-01-10 MED ORDER — AMLODIPINE BESYLATE 10 MG PO TABS: 5.0000 mg | ORAL_TABLET | Freq: Every day | ORAL | 3 refills | Status: DC

## 2019-01-10 MED ORDER — LANSOPRAZOLE 15 MG PO CPDR: 15.0000 mg | DELAYED_RELEASE_CAPSULE | Freq: Every day | ORAL | 3 refills | Status: AC

## 2019-01-10 MED ORDER — POTASSIUM CHLORIDE CRYS ER 20 MEQ PO TBCR: 20.0000 meq | EXTENDED_RELEASE_TABLET | Freq: Every day | ORAL | 3 refills | Status: DC

## 2019-01-10 MED ORDER — ONETOUCH ULTRA VI STRP: ORAL_STRIP | 3 refills | Status: DC

## 2019-01-10 NOTE — Patient Instructions (Addendum)
  If you have medicare related insurance (such as traditional Medicare, [employer], [employer], or similar), Please make an appointment at the scheduling desk with Sharee Pimple, the [employer], for your Wellness visit in this office, which is a benefit with your insurance.     Bursitis  Bursitis is when the fluid-filled sac (bursa) that covers and protects a joint is swollen (inflamed). Bursitis is most common near joints such as the knees, elbows, hips, and shoulders. It can cause pain and stiffness. Follow these instructions at home: Medicines  Take over-the-counter and prescription medicines only as told by your doctor.  If you were prescribed an antibiotic medicine, take it as told by your doctor. Do not stop taking it even if you start to feel better. General instructions   Rest the affected area as told by your doctor. ? If you can, raise (elevate) the affected area above the level of your heart while you are sitting or lying down. ? Avoid doing things that make the pain worse.  Use a splint, brace, pad, or walking aid as told by your doctor.  If directed, put ice on the affected area: ? If you have a removable splint or brace, take it off as told by your doctor. ? Put ice in a plastic bag. ? Place a towel between your skin and the bag, or between the splint or brace and the bag. ? Leave the ice on for 20 minutes, 2-3 times a day.  Keep all follow-up visits as told by your doctor. This is important. Preventing symptoms Do these things to help you not have symptoms again:  Wear knee pads if you kneel often.  Wear sturdy running or walking shoes that fit you well.  Take a lot of breaks during activities that involve doing the same movements again and again.  Before you do any activity that takes a lot of effort, get your body ready by stretching.  Stay at a healthy weight or lose weight if your doctor says you should. If you need help doing  this, ask your doctor.  Exercise often. If you start any new physical activity, do it slowly. Contact a doctor if you:  Have a fever.  Have chills.  Have symptoms that do not get better with treatment or home care. Summary  Bursitis is when the fluid-filled sac (bursa) that covers and protects a joint is swollen.  Rest the affected area as told by your doctor.  Avoid doing things that make the pain worse.  Put ice on the affected area as told by your doctor. This information is not intended to replace advice given to you by your health care provider. Make sure you discuss any questions you have with your health care provider. Document Released: 12/10/2009 Document Revised: 06/04/2017 Document Reviewed: 05/07/2017 Elsevier Patient Education  2020 [employer].

## 2019-01-10 NOTE — Addendum Note (Signed)
Addended by: Karren Cobble on: 01/10/2019 03:50 PM   Modules accepted: Orders

## 2019-01-10 NOTE — Assessment & Plan Note (Signed)
Labs

## 2019-01-10 NOTE — Assessment & Plan Note (Signed)
F/u w/Nephrology 

## 2019-01-10 NOTE — Assessment & Plan Note (Signed)
compr socks 

## 2019-01-10 NOTE — Assessment & Plan Note (Signed)
Chronic OA

## 2019-01-10 NOTE — Assessment & Plan Note (Signed)
Aspirin, Crestor 

## 2019-01-10 NOTE — Assessment & Plan Note (Signed)
F/u Dr Cruzita Lederer

## 2019-01-10 NOTE — Progress Notes (Signed)
Subjective:  Patient ID: [name], female    DOB: 1950-08-20  Age: 67 y.o. MRN: [id number]  CC: No chief complaint on file.   HPI [name] presents for DM, HTN, CAD, CRF f/u C/o R knee pain x 1 mo - severe  Outpatient Medications Prior to Visit  Medication Sig Dispense Refill  . amLODipine (NORVASC) 10 MG tablet Take 0.5 tablets (5 mg total) by mouth daily. 45 tablet 3  . aspirin 81 MG chewable tablet Chew 1 tablet (81 mg total) by mouth daily.    . betamethasone valerate ointment (VALISONE) 0.1 % Apply 1 application topically 2 (two) times daily. 15 g 0  . BYSTOLIC 10 MG tablet TAKE 1 TABLET BY MOUTH EVERY DAY 90 tablet 1  . calcium carbonate (OS-CAL) 600 MG TABS tablet Take 600 mg by mouth 2 (two) times daily with a meal.    . clindamycin (CLINDAGEL) 1 % gel Apply topically 2 (two) times daily. Use for 2 months 30 g 2  . fluticasone (FLONASE) 50 MCG/ACT nasal spray Place 2 sprays into both nostrils daily as needed for allergies or rhinitis.    [name] glipiZIDE (GLUCOTROL XL) 5 MG 24 hr tablet Take 1 tablet (5 mg total) by mouth daily with breakfast. 90 tablet 3  . hydrALAZINE (APRESOLINE) 25 MG tablet Take 1 tablet (25 mg total) by mouth 3 (three) times daily. 270 tablet 3  . hydrOXYzine (ATARAX/VISTARIL) 25 MG tablet Take 1-2 tablets (25-50 mg total) by mouth every 8 (eight) hours as needed for itching. 60 tablet 0  . nitroGLYCERIN (NITROSTAT) 0.4 MG SL tablet DISSOLVE 1 TABLET UNDER THE TONGUE EVERY 5 MINUTES AS  NEEDED FOR CHEST PAIN 90 tablet 0  . ONE TOUCH ULTRA TEST test strip USE TO TEST BLOOD SUGAR TWO TIMES A DAY AS DIRECTED 100 each 11  . potassium chloride SA (K-DUR,KLOR-CON) 20 MEQ tablet Take 1 tablet (20 mEq total) by mouth daily. 90 tablet 3  . rosuvastatin (CRESTOR) 5 MG tablet TAKE 1 TABLET BY MOUTH  DAILY AT 6PM 90 tablet 3  . torsemide (DEMADEX) 100 MG tablet TAKE 0.5-1 TABLETS (50-100 MG TOTAL) BY MOUTH DAILY. 90 tablet 3  . vitamin B-12  (CYANOCOBALAMIN) 1000 MCG tablet Take 1,000 mcg by mouth daily.     No facility-administered medications prior to visit.     ROS: Review of Systems  Constitutional: Positive for fatigue and unexpected weight change. Negative for activity change, appetite change and chills.  HENT: Negative for congestion, mouth sores and sinus pressure.   Eyes: Negative for visual disturbance.  Respiratory: Negative for cough and chest tightness.   Cardiovascular: Positive for leg swelling.  Gastrointestinal: Negative for abdominal pain and nausea.  Genitourinary: Negative for difficulty urinating, frequency and vaginal pain.  Musculoskeletal: Positive for arthralgias, back pain, gait problem and neck pain.  Skin: Negative for pallor and rash.  Neurological: Negative for dizziness, tremors, weakness, numbness and headaches.  Psychiatric/Behavioral: Negative for confusion, sleep disturbance and suicidal ideas.    Objective:  BP 134/72 (BP Location: Left Arm, Patient Position: Sitting, Cuff Size: Normal)   Pulse 85   Temp 97.9 F (36.6 C) (Oral)   Ht 5\' 2"  (1.575 m)   Wt 195 lb (88.5 kg)   SpO2 98%   BMI 35.67 kg/m   BP Readings from Last 3 Encounters:  01/10/19 134/72  12/30/18 138/70  11/15/18 137/66    Wt Readings from Last 3 Encounters:  01/10/19 195 lb (88.5 kg)  12/30/18 197 lb (89.4 kg)  11/15/18 193 lb 3.2 oz (87.6 kg)    Physical Exam Constitutional:      General: She is not in acute distress.    Appearance: She is well-developed.  HENT:     Head: Normocephalic.     Right Ear: External ear normal.     Left Ear: External ear normal.     Nose: Nose normal.  Eyes:     General:        Right eye: No discharge.        Left eye: No discharge.     Conjunctiva/sclera: Conjunctivae normal.     Pupils: Pupils are equal, round, and reactive to light.  Neck:     Musculoskeletal: Normal range of motion and neck supple.     Thyroid: No thyromegaly.     Vascular: No JVD.      Trachea: No tracheal deviation.  Cardiovascular:     Rate and Rhythm: Normal rate and regular rhythm.     Heart sounds: Normal heart sounds.  Pulmonary:     Effort: No respiratory distress.     Breath sounds: No stridor. No wheezing.  Abdominal:     General: Bowel sounds are normal. There is no distension.     Palpations: Abdomen is soft. There is no mass.     Tenderness: There is no abdominal tenderness. There is no guarding or rebound.  Musculoskeletal:        General: Tenderness present.  Lymphadenopathy:     Cervical: No cervical adenopathy.  Skin:    Findings: No erythema or rash.  Neurological:     Mental Status: She is oriented to person, place, and time.     Cranial Nerves: No cranial nerve deficit.     Motor: No abnormal muscle tone.     Coordination: Coordination abnormal.     Gait: Gait abnormal.     Deep Tendon Reflexes: Reflexes normal.  Psychiatric:        Behavior: Behavior normal.        Thought Content: Thought content normal.        Judgment: Judgment normal.   R knee w/pain on ROM Edema B ankles  Lab Results  Component Value Date   WBC 4.8 10/26/2017   HGB 10.0 (L) 10/26/2017   HCT 29.9 (L) 10/26/2017   PLT 146.0 (L) 10/26/2017   GLUCOSE 115 (H) 03/29/2018   CHOL 129 03/29/2018   TRIG 71 03/29/2018   HDL 46 03/29/2018   LDLCALC 69 03/29/2018   ALT 10 03/29/2018   AST 12 03/29/2018   NA 138 03/29/2018   K 4.9 03/29/2018   CL 105 03/29/2018   CREATININE 2.73 (H) 03/29/2018   BUN 41 (H) 03/29/2018   CO2 16 (L) 03/29/2018   TSH 1.63 06/25/2017   INR 1.16 06/07/2013   HGBA1C 9.0 (A) 12/30/2018   MICROALBUR 13.9 (H) 10/13/2007    Dg Bone Density (dxa)  Result Date: 12/19/2018 EXAM: DUAL X-RAY ABSORPTIOMETRY (DXA) FOR BONE MINERAL DENSITY IMPRESSION: Referring Physician:  Salvadore Dom Your patient completed a BMD test using Lunar IDXA DXA system ( analysis version: 16 ) manufactured by [employer]. Technologist: AW PATIENT: Name:  [name], [name] Patient ID: [id number] Birth Date: 1950-11-02 Height: 61.0 in. Sex: Female Measured: 12/19/2018 Weight: 196.2 lbs. Indications: Estrogen Deficient, Postmenopausal, Tobacco User (Current Smoker) Fractures: None Treatments: Calcium (E943.0), Vitamin D (E933.5) ASSESSMENT: The BMD measured at Femur Total Left is 0.893 g/cm2 with a T-score of -  0.9. This patient is considered normal according to [facility]) criteria. The scan quality is good. L-2 and L-3 were excluded due to degenerative changes. Site Region Measured Date Measured Age YA BMD Significant CHANGE T-score DualFemur Total Left 12/19/2018 68.1 -0.9 0.893 g/cm2 AP Spine L1-L4 (L2,L3) 12/19/2018 68.1 0.8 1.266 g/cm2 DualFemur Total Mean 12/19/2018 68.1 -0.8 0.904 g/cm2 World Health Organization [facility]) criteria for post-menopausal, Caucasian Women: Normal       T-score at or above -1 SD Osteopenia   T-score between -1 and -2.5 SD Osteoporosis T-score at or below -2.5 SD RECOMMENDATION: 1. All patients should optimize calcium and vitamin D intake. 2. Consider FDA approved medical therapies in postmenopausal women and men aged 23 years and older, based on the following: a. A hip or vertebral (clinical or morphometric) fracture b. T- score < or = -2.5 at the femoral neck or spine after appropriate evaluation to exclude secondary causes c. Low bone mass (T-score between -1.0 and -2.5 at the femoral neck or spine) and a 10 year probability of a hip fracture > or = 3% or a 10 year probability of a major osteoporosis-related fracture > or = 20% based on the [place]-adapted WHO algorithm d. Clinician judgment and/or patient preferences may indicate treatment for people with 10-year fracture probabilities above or below these levels FOLLOW-UP: Patients with diagnosis of osteoporosis or at high risk for fracture should have regular bone mineral density tests. For patients eligible for Medicare, routine testing is allowed once every 2 years.  The testing frequency can be increased to one year for patients who have rapidly progressing disease, those who are receiving or discontinuing medical therapy to restore bone mass, or have additional risk factors. I have reviewed this report and agree with the above findings. [facility] Electronically Signed   By: Fidela Salisbury M.D.   On: 12/19/2018 14:20   Mm Digital Screening Bilateral  Result Date: 12/20/2018 CLINICAL DATA:  Screening. EXAM: DIGITAL SCREENING BILATERAL MAMMOGRAM WITH CAD COMPARISON:  Previous exam(s). ACR Breast Density Category a: The breast tissue is almost entirely fatty. FINDINGS: There are no findings suspicious for malignancy. Images were processed with CAD. IMPRESSION: No mammographic evidence of malignancy. A result letter of this screening mammogram will be mailed directly to the patient. RECOMMENDATION: Screening mammogram in one year. (Code:SM-B-01Y) BI-RADS CATEGORY  1: Negative. Electronically Signed   By: Margarette Canada M.D.   On: 12/20/2018 11:33    Assessment & Plan:   There are no diagnoses linked to this encounter.   No orders of the defined types were placed in this encounter.    Follow-up: No follow-ups on file.  Walker Kehr, MD

## 2019-01-11 ENCOUNTER — Encounter: Payer: Self-pay | Admitting: *Deleted

## 2019-02-27 ENCOUNTER — Other Ambulatory Visit: Payer: Self-pay | Admitting: Internal Medicine

## 2019-03-08 ENCOUNTER — Telehealth: Payer: Self-pay | Admitting: *Deleted

## 2019-03-08 NOTE — Telephone Encounter (Signed)
Delsym cough syrup as needed.  Thank you

## 2019-03-08 NOTE — Telephone Encounter (Signed)
Copied from Tina [phone]. Topic: General - Inquiry >> Mar 07, 2019 12:18 PM Mathis Bud wrote: Reason for CRM: patient is requesting a call from pcp or nurse too get info on what over the counter med to get for her cough.  Pt call back [phone]

## 2019-03-09 ENCOUNTER — Encounter: Payer: Self-pay | Admitting: Internal Medicine

## 2019-03-09 ENCOUNTER — Ambulatory Visit (INDEPENDENT_AMBULATORY_CARE_PROVIDER_SITE_OTHER): Payer: Medicare Other | Admitting: Internal Medicine

## 2019-03-09 DIAGNOSIS — J449 Chronic obstructive pulmonary disease, unspecified: Secondary | ICD-10-CM | POA: Diagnosis not present

## 2019-03-09 MED ORDER — CEFUROXIME AXETIL 250 MG PO TABS: 250.0000 mg | ORAL_TABLET | Freq: Two times a day (BID) | ORAL | 1 refills | Status: DC

## 2019-03-09 MED ORDER — PROMETHAZINE-CODEINE 6.25-10 MG/5ML PO SYRP: 5.0000 mL | ORAL_SOLUTION | ORAL | 0 refills | Status: DC | PRN

## 2019-03-09 MED ORDER — FLUCONAZOLE 150 MG PO TABS: 150.0000 mg | ORAL_TABLET | Freq: Once | ORAL | 1 refills | Status: AC

## 2019-03-09 NOTE — Telephone Encounter (Signed)
Left detailed message informing pt of below.  

## 2019-03-09 NOTE — Progress Notes (Signed)
Virtual Visit via Video Note  I connected with [name] on 03/09/19 at  3:40 PM EDT by a video enabled telemedicine application and verified that I am speaking with the correct person using two identifiers.   I discussed the limitations of evaluation and management by telemedicine and the availability of in person appointments. The patient expressed understanding and agreed to proceed.  History of Present Illness: C/o dry cough x 2 weeks. No sick contacts  There has been no runny nose, chest pain, shortness of breath, abdominal pain, diarrhea, constipation, arthralgias, skin rashes.   Observations/Objective: The patient appears to be in no acute distress, looks ok.  Assessment and Plan:  See my Assessment and Plan. Follow Up Instructions:    I discussed the assessment and treatment plan with the patient. The patient was provided an opportunity to ask questions and all were answered. The patient agreed with the plan and demonstrated an understanding of the instructions.   The patient was advised to call back or seek an in-person evaluation if the symptoms worsen or if the condition fails to improve as anticipated.  I provided face-to-face time during this encounter. We were at different locations.   Walker Kehr, MD

## 2019-03-09 NOTE — Assessment & Plan Note (Signed)
A flare up: Ceftin, Diflucan, Prom-cod syr Call if issues No COVID19 exposure

## 2019-03-09 NOTE — Patient Instructions (Signed)
Call if not better in 1 week 

## 2019-03-15 DIAGNOSIS — N184 Chronic kidney disease, stage 4 (severe): Secondary | ICD-10-CM | POA: Diagnosis not present

## 2019-03-15 DIAGNOSIS — E1122 Type 2 diabetes mellitus with diabetic chronic kidney disease: Secondary | ICD-10-CM | POA: Diagnosis not present

## 2019-03-15 DIAGNOSIS — Z1159 Encounter for screening for other viral diseases: Secondary | ICD-10-CM | POA: Diagnosis not present

## 2019-03-15 DIAGNOSIS — D631 Anemia in chronic kidney disease: Secondary | ICD-10-CM | POA: Diagnosis not present

## 2019-03-15 DIAGNOSIS — N189 Chronic kidney disease, unspecified: Secondary | ICD-10-CM | POA: Diagnosis not present

## 2019-03-15 DIAGNOSIS — I129 Hypertensive chronic kidney disease with stage 1 through stage 4 chronic kidney disease, or unspecified chronic kidney disease: Secondary | ICD-10-CM | POA: Diagnosis not present

## 2019-03-15 DIAGNOSIS — N2581 Secondary hyperparathyroidism of renal origin: Secondary | ICD-10-CM | POA: Diagnosis not present

## 2019-03-23 ENCOUNTER — Other Ambulatory Visit: Payer: Self-pay | Admitting: Cardiovascular Disease

## 2019-03-28 ENCOUNTER — Ambulatory Visit (INDEPENDENT_AMBULATORY_CARE_PROVIDER_SITE_OTHER): Payer: Medicare Other | Admitting: Internal Medicine

## 2019-03-28 ENCOUNTER — Encounter: Payer: Self-pay | Admitting: Internal Medicine

## 2019-03-28 DIAGNOSIS — J449 Chronic obstructive pulmonary disease, unspecified: Secondary | ICD-10-CM

## 2019-03-28 DIAGNOSIS — E1159 Type 2 diabetes mellitus with other circulatory complications: Secondary | ICD-10-CM

## 2019-03-28 MED ORDER — ALBUTEROL SULFATE 4 MG PO TABS: 4.0000 mg | ORAL_TABLET | Freq: Three times a day (TID) | ORAL | 1 refills | Status: DC

## 2019-03-28 MED ORDER — BENZONATATE 200 MG PO CAPS: 200.0000 mg | ORAL_CAPSULE | Freq: Two times a day (BID) | ORAL | 0 refills | Status: DC | PRN

## 2019-03-28 MED ORDER — GLIPIZIDE 5 MG PO TABS: ORAL_TABLET | ORAL | 0 refills | Status: DC

## 2019-03-28 MED ORDER — ONDANSETRON HCL 4 MG PO TABS: 4.0000 mg | ORAL_TABLET | Freq: Three times a day (TID) | ORAL | 0 refills | Status: AC | PRN

## 2019-03-28 MED ORDER — METHYLPREDNISOLONE 4 MG PO TBPK: ORAL_TABLET | ORAL | 0 refills | Status: DC

## 2019-03-28 NOTE — Assessment & Plan Note (Addendum)
Glipizide bid while on steroids Medrol Tessalon, Proventil po Zofran for nausea Go to ER if not better

## 2019-03-28 NOTE — Assessment & Plan Note (Signed)
Glipizide bid while on steroids

## 2019-03-28 NOTE — Progress Notes (Signed)
Virtual Visit via Video Note  I connected with [name] on 03/28/19 at  2:40 PM EDT by a video enabled telemedicine application and verified that I am speaking with the correct person using two identifiers.   I discussed the limitations of evaluation and management by telemedicine and the availability of in person appointments. The patient expressed understanding and agreed to proceed.  History of Present Illness: C/o cough, nausea, wheezing, fatigue, hoarseness x 3 week - not better No fever  There has been no runny nose, cough, chest pain, shortness of breath, abdominal pain, diarrhea, constipation, arthralgias, skin rashes.   Observations/Objective: The patient appears to be in no acute distress, looks very tired, sounds hoarse  Assessment and Plan:  See my Assessment and Plan. Follow Up Instructions:    I discussed the assessment and treatment plan with the patient. The patient was provided an opportunity to ask questions and all were answered. The patient agreed with the plan and demonstrated an understanding of the instructions.   The patient was advised to call back or seek an in-person evaluation if the symptoms worsen or if the condition fails to improve as anticipated.  I provided face-to-face time during this encounter. We were at different locations.   Walker Kehr, MD

## 2019-03-28 NOTE — Patient Instructions (Signed)
Go to ER if worse 

## 2019-04-04 ENCOUNTER — Other Ambulatory Visit: Payer: Self-pay | Admitting: Internal Medicine

## 2019-04-14 ENCOUNTER — Other Ambulatory Visit: Payer: Self-pay | Admitting: Internal Medicine

## 2019-04-19 ENCOUNTER — Other Ambulatory Visit: Payer: Self-pay | Admitting: Internal Medicine

## 2019-04-21 ENCOUNTER — Other Ambulatory Visit: Payer: Self-pay | Admitting: Internal Medicine

## 2019-04-21 MED ORDER — POTASSIUM CHLORIDE CRYS ER 20 MEQ PO TBCR: 20.0000 meq | EXTENDED_RELEASE_TABLET | Freq: Every day | ORAL | 0 refills | Status: DC

## 2019-04-21 MED ORDER — AMLODIPINE BESYLATE 10 MG PO TABS: 5.0000 mg | ORAL_TABLET | Freq: Every day | ORAL | 0 refills | Status: DC

## 2019-04-21 NOTE — Telephone Encounter (Signed)
Requested medication (s) are due for refill today: yes  Requested medication (s) are on the active medication list:yes  Last refill:  01/10/2019  Future visit scheduled: yes  Notes to clinic:  Pt is calling and waiting on mail order opt[facility] . Pt needs 5 days supply amlodipine and potassium chloride 20 [address]. [facility] [address]. Pt is out. Pt said her chest has been hurting due to not having her bp med in 3 days. Pt was offered to talk with nurse pt decline. I advised patient that she needs to go to ed if she is having chest pain and she declined.    Requested Prescriptions  Pending Prescriptions Disp Refills   amLODipine (NORVASC) 10 MG tablet 15 tablet 0    Sig: Take 0.5 tablets (5 mg total) by mouth daily.     Cardiovascular:  Calcium Channel Blockers Passed - 04/21/2019  1:59 PM      Passed - Last BP in normal range    BP Readings from Last 1 Encounters:  01/10/19 134/72         Passed - Valid encounter within last 6 months    Recent Outpatient Visits          3 weeks ago Controlled type 2 diabetes mellitus with other circulatory complication, without long-term current use of insulin ([place])   [place], Evie Lacks, MD   1 month ago COPD mixed type [facility])   [place] [name], Evie Lacks, MD   3 months ago Hyperlipidemia, unspecified hyperlipidemia type   [place], Evie Lacks, MD   8 months ago Controlled type 2 diabetes mellitus with other circulatory complication, without long-term current use of insulin ([place])   [place] [name], Evie Lacks, MD   11 months ago Controlled type 2 diabetes mellitus with other circulatory complication, without long-term current use of insulin ([place])   [place], Evie Lacks, MD      Future Appointments            In 3 weeks Nahser, Wonda Cheng, MD [address] [facility],  [facility]   In 3 weeks [name], Evie Lacks, MD [place], [facility]            potassium chloride SA (KLOR-CON) 20 MEQ tablet 15 tablet 0    Sig: Take 1 tablet (20 mEq total) by mouth daily.     Endocrinology:  Minerals - Potassium Supplementation Failed - 04/21/2019  1:59 PM      Failed - Cr in normal range and within 360 days    Creat  Date Value Ref Range Status  01/13/2017 2.16 (H) 0.50 - 0.99 mg/dL Final    Comment:      For patients > or = 68 years of age: The upper reference limit for Creatinine is approximately 13% higher for people identified as African-American.      Creatinine, Ser  Date Value Ref Range Status  01/10/2019 2.60 (H) 0.40 - 1.20 mg/dL Final         Passed - K in normal range and within 360 days    Potassium  Date Value Ref Range Status  01/10/2019 4.4 3.5 - 5.1 mEq/L Final         Passed - Valid encounter within last 12 months    Recent Outpatient Visits          3 weeks ago Controlled  type 2 diabetes mellitus with other circulatory complication, without long-term current use of insulin ([place])   [place], Evie Lacks, MD   1 month ago COPD mixed type [facility])   [facility] [name], Evie Lacks, MD   3 months ago Hyperlipidemia, unspecified hyperlipidemia type   [place], Evie Lacks, MD   8 months ago Controlled type 2 diabetes mellitus with other circulatory complication, without long-term current use of insulin ([place])   [place] [name], Evie Lacks, MD   11 months ago Controlled type 2 diabetes mellitus with other circulatory complication, without long-term current use of insulin ([place])   [place], MD      Future Appointments            In 3 weeks Nahser, Wonda Cheng, MD [place], [facility]   In 3 weeks [name],  Evie Lacks, MD [place], [place]

## 2019-04-21 NOTE — Telephone Encounter (Signed)
Pt is calling and waiting on mail order [facility] . Pt needs 5 days supply amlodipine and potassium chloride 20 [address]. [facility] [address]. Pt is out. Pt said her chest has been hurting due to not having her bp med in 3 days. Pt was offered to talk with nurse pt decline

## 2019-04-23 ENCOUNTER — Other Ambulatory Visit: Payer: Self-pay | Admitting: Internal Medicine

## 2019-04-28 ENCOUNTER — Other Ambulatory Visit: Payer: Self-pay

## 2019-05-01 DIAGNOSIS — H35033 Hypertensive retinopathy, bilateral: Secondary | ICD-10-CM | POA: Diagnosis not present

## 2019-05-01 DIAGNOSIS — H4311 Vitreous hemorrhage, right eye: Secondary | ICD-10-CM | POA: Diagnosis not present

## 2019-05-01 DIAGNOSIS — E113512 Type 2 diabetes mellitus with proliferative diabetic retinopathy with macular edema, left eye: Secondary | ICD-10-CM | POA: Diagnosis not present

## 2019-05-01 DIAGNOSIS — H3582 Retinal ischemia: Secondary | ICD-10-CM | POA: Diagnosis not present

## 2019-05-01 DIAGNOSIS — H53131 Sudden visual loss, right eye: Secondary | ICD-10-CM | POA: Diagnosis not present

## 2019-05-02 ENCOUNTER — Encounter: Payer: Self-pay | Admitting: Internal Medicine

## 2019-05-02 ENCOUNTER — Other Ambulatory Visit: Payer: Self-pay

## 2019-05-02 ENCOUNTER — Ambulatory Visit (INDEPENDENT_AMBULATORY_CARE_PROVIDER_SITE_OTHER): Payer: Medicare Other | Admitting: Internal Medicine

## 2019-05-02 VITALS — BP 128/80 | HR 79 | Ht 62.0 in | Wt 189.0 lb

## 2019-05-02 DIAGNOSIS — E11319 Type 2 diabetes mellitus with unspecified diabetic retinopathy without macular edema: Secondary | ICD-10-CM | POA: Diagnosis not present

## 2019-05-02 DIAGNOSIS — Z6837 Body mass index (BMI) 37.0-37.9, adult: Secondary | ICD-10-CM

## 2019-05-02 DIAGNOSIS — IMO0002 Reserved for concepts with insufficient information to code with codable children: Secondary | ICD-10-CM

## 2019-05-02 DIAGNOSIS — E785 Hyperlipidemia, unspecified: Secondary | ICD-10-CM

## 2019-05-02 DIAGNOSIS — E1165 Type 2 diabetes mellitus with hyperglycemia: Secondary | ICD-10-CM

## 2019-05-02 DIAGNOSIS — H4311 Vitreous hemorrhage, right eye: Secondary | ICD-10-CM | POA: Diagnosis not present

## 2019-05-02 LAB — POCT GLYCOSYLATED HEMOGLOBIN (HGB A1C): Hemoglobin A1C: 8 % — AB (ref 4.0–5.6)

## 2019-05-02 NOTE — Progress Notes (Addendum)
Patient ID: [name], female   DOB: 1951/02/25, 68 y.o.   MRN: [id number]  HPI: [name] is a 68 y.o.-year-old female, returning for f/u DM2, dx in 2006, insulin-independent, uncontrolled, with complications (CAD - s/p AMI 06/2013, CHF; CKD; PN; DR). Last visit 4 months ago.  She will have surgery for hemorrhage in her R eye later today.   She had a URI in 03/2019 (Covid negative) >> was on Prednisone and antibiotics.  Sugars are higher than, up to 300s, so she increased her glipizide to twice a day at that time.  Sugars improved afterwards so she is back to once a day glipizide.  Last hemoglobin A1c was: 12/30/2018: HbA1c calculated from fructosamine is higher, at 8.2%, possibly 2/2 drinking juice 08/24/2018: HbA1c calculated from fructosamine is higher, at 8%, possibly 2/2 ABx  04/20/2018: HbA1c calculated from fructosamine is slightly better than before, at 7% 12/15/2017: HbA1c calculated from fructosamine: 7.17%, slightly improved from last visit  08/17/2017: HbA1c calculated from fructosamine: 7.2%, slightly improved 04/15/2017: HbA1c calculated from the fructosamine is lower than measured, at 7.3% Lab Results  Component Value Date   HGBA1C 9.0 (A) 12/30/2018   HGBA1C 8.7 (A) 08/24/2018   HGBA1C 8.1 (A) 04/20/2018  08/21/2016: HbA1c 6.0%.  She refused insulin in the past. She started to change her diet after her hemoglobin A1c returned high, at 16.2% in 09/2014. She cut down portions, changed the meal content to include more fiber and less carbs.  HbA1c decreased dramatically.  Pt is on: - Glipizide ER 5 mg in a.m.-started 2016 She stopped metformin ER in 01/2017. She tried Tradjenta 5 mg daily in am >> CP with it (started 10/2014) >> had to stop She tried [place] >> decreased [name] and yeast inf. She tried regular metformin >> diarrhea  Pt checks her sugars twice a day per review of her log: - am:   87-124, 137 >> 78, 85-136, 141 >> 105-140, 148 (on Prednisone:  233-269) - 2h after b'fast: 107-141, 152 >> 99-130, 140 >> 108-147 - before lunch: 127-159, 168 >> 132-159, 168 (snack) >> 143 - 2h after lunch: 133-155 >> 111, 135-166 >> 133-182 >> 169  - before dinner: 89-168, 171 >> 68, 132-171 >> 118-146 - 2h after dinner: 120-170, 170 >> 120-177 >> 100-157, 177(on prednisone: 300) - bedtime:   n/c >> 137 >> n/c >> 147-157 >> n/c - nighttime: n/c Lowest sugar was 60 >> 89; is unclear at which level she has hypoglycemia awareness. Highest sugar was  182 >> 300 (Prednisone).  Glucometer: One Touch Ultra  Pt's meals are: - Breakfast: oatmeal, cereals, [place] bacon, egg toast - Lunch: sandwich or fruit salad - Dinner: salmon/chicken, Brussel sprouts, other veggies, sweet potatoes or brown rice, spaghetti - Snacks: 2: carrots with dip; yoghurt with fruit, veggies + dip She walks for exercise. She saw Dr. Hollie [name] with nephrology.  She was started on a high potassium low protein diet. At last visit she was drinking pomegranate juice 1 cup 3 times daily and I advised her to stop. She now drinks diet juice.  -+ CKD-sees nephrology.  Latest BUN/creatinine: Lab Results  Component Value Date   BUN 68 (H) 01/10/2019   CREATININE 2.60 (H) 01/10/2019  She stopped olmesartan due to worsening kidney function and also cough. -+ HL; last set of lipids: Lab Results  Component Value Date   CHOL 122 01/10/2019   HDL 46.40 01/10/2019   LDLCALC 59 01/10/2019   TRIG 80.0 01/10/2019  CHOLHDL 3 01/10/2019  On Crestor 5 every other day. - last eye exam was in 04/2019: + DR, + cataract.  Had laser surgery. Dr. Posey [name]. -+ Numbness and tingling in her feet.  She also has a history of HTN, GERD, and anemia.  She also has a history of goiter.  Thyroid ultrasound showed only small cysts, no nodules.  Pt denies: - feeling nodules in neck - hoarseness - dysphagia - choking - SOB with lying down  Latest TSH was normal: Lab Results  Component Value Date   TSH 1.88  01/10/2019   ROS: Constitutional: no weight gain/+ weight loss, no fatigue, no subjective hyperthermia, no subjective hypothermia, + nocturia Eyes: + blurry vision, no xerophthalmia ENT: no sore throat, + see HPI Cardiovascular: no CP/no SOB/no palpitations/no leg swelling Respiratory: no cough/no SOB/no wheezing Gastrointestinal: no N/no V/no D/no C/no acid reflux Musculoskeletal: no muscle aches/no joint aches Skin: no rashes, no hair loss Neurological: no tremors/+ numbness/+ tingling/no dizziness  I reviewed pt's medications, allergies, PMH, social hx, family hx, and changes were documented in the history of present illness. Otherwise, unchanged from my initial visit note.  Past Medical History:  Diagnosis Date  . Anemia   . COPD (chronic obstructive pulmonary disease) ([place])   . Diabetes mellitus   . Hyperlipidemia   . Hypertension   . Ischemic cardiomyopathy 06/11/2013  . Low back pain   . NSTEMI (non-ST elevated myocardial infarction) ([place]) 06/11/2013  . Obesity   . Personal history of colon cancer   . Renal insufficiency    Past Surgical History:  Procedure Laterality Date  . COLECTOMY    . LEFT HEART CATHETERIZATION WITH CORONARY ANGIOGRAM N/A 06/09/2013   Procedure: LEFT HEART CATHETERIZATION WITH CORONARY ANGIOGRAM;  Surgeon: Ramond Dial, MD;  [facility] C[facility] LAB;  Service: Cardiovascular;  Laterality: N/A;   History   Social History  . Marital Status: Single    Spouse Name: N/A  . Number of Children: 0   Occupational History  . retired   Social History Main Topics  . Smoking status:  former smoker     Types: Cigarettes  . Smokeless tobacco: Never Used  . Alcohol Use: No  . Drug Use: No   Current Outpatient Medications on File Prior to Visit  Medication Sig Dispense Refill  . albuterol (PROVENTIL) 4 MG tablet Take 1 tablet (4 mg total) by mouth 3 (three) times daily. 90 tablet 1  . amLODipine (NORVASC) 10 MG tablet TAKE 1/2 TABLET BY MOUTH DAILY  15 tablet 2  . aspirin 81 MG chewable tablet Chew 1 tablet (81 mg total) by mouth daily.    . benzonatate (TESSALON) 200 MG capsule Take 1 capsule (200 mg total) by mouth 2 (two) times daily as needed for cough. 20 capsule 0  . betamethasone valerate ointment (VALISONE) 0.1 % Apply 1 application topically 2 (two) times daily. 15 g 0  . BYSTOLIC 10 MG tablet TAKE 1 TABLET BY MOUTH EVERY DAY 90 tablet 1  . calcium carbonate (OS-CAL) 600 MG TABS tablet Take 600 mg by mouth 2 (two) times daily with a meal.    . clindamycin (CLINDAGEL) 1 % gel Apply topically 2 (two) times daily. Use for 2 months 30 g 2  . fluticasone (FLONASE) 50 MCG/ACT nasal spray Place 2 sprays into both nostrils daily as needed for allergies or rhinitis.    [name] glipiZIDE (GLUCOTROL XL) 5 MG 24 hr tablet Take 1 tablet (5 mg total) by mouth  daily with breakfast. 90 tablet 3  . glipiZIDE (GLUCOTROL) 5 MG tablet TAKE 1 TABLET BY MOUTH TWICE A DAY WHILE ON MEDROL THEN GO BACK TO DAILY 30 tablet 11  . glucose blood (ONETOUCH ULTRA) test strip USE TO TEST BLOOD SUGAR TWO TIMES A DAY AS DIRECTED 200 each 3  . hydrALAZINE (APRESOLINE) 25 MG tablet TAKE 1 TABLET BY MOUTH THREE TIMES A DAY 270 tablet 2  . hydrOXYzine (ATARAX/VISTARIL) 25 MG tablet Take 1-2 tablets (25-50 mg total) by mouth every 8 (eight) hours as needed for itching. 60 tablet 0  . lansoprazole (PREVACID) 15 MG capsule Take 1 capsule (15 mg total) by mouth daily at 12 noon. 90 capsule 3  . methylPREDNISolone (MEDROL DOSEPAK) 4 MG TBPK tablet As directed 21 tablet 0  . nitroGLYCERIN (NITROSTAT) 0.4 MG SL tablet DISSOLVE 1 TABLET UNDER THE TONGUE EVERY 5 MINUTES AS  NEEDED FOR CHEST PAIN 90 tablet 0  . ondansetron (ZOFRAN) 4 MG tablet Take 1 tablet (4 mg total) by mouth every 8 (eight) hours as needed for nausea or vomiting. 20 tablet 0  . potassium chloride SA (KLOR-CON) 20 MEQ tablet Take 1 tablet (20 mEq total) by mouth daily. 15 tablet 0  . promethazine-codeine (PHENERGAN  WITH CODEINE) 6.25-10 MG/5ML syrup Take 5 mLs by mouth every 4 (four) hours as needed. 300 mL 0  . rosuvastatin (CRESTOR) 5 MG tablet TAKE 1 TABLET BY MOUTH  DAILY AT 6PM 90 tablet 3  . torsemide (DEMADEX) 100 MG tablet TAKE 0.5-1 TABLETS (50-100 MG TOTAL) BY MOUTH DAILY. 90 tablet 3  . vitamin B-12 (CYANOCOBALAMIN) 1000 MCG tablet Take 1,000 mcg by mouth daily.     No current facility-administered medications on file prior to visit.    Allergies  Allergen Reactions  . Tradjenta [Linagliptin] Anaphylaxis    CP  . Atorvastatin     REACTION: aches and pains  . Enalapril Maleate     REACTION: cough  . Farxiga [Dapagliflozin] Itching  . Hydrochlorothiazide     REACTION: hair loss  . Kenalog [Triamcinolone Acetonide]     HANDS NUMB   . Metformin And Related     Diarrhea, dizziness  . Propoxyphene N-Acetaminophen Hives  . Simvastatin     REACTION: cramps  . Spironolactone     REACTION: cramps   Family History  Problem Relation Age of Onset  . Hypertension Other    PE: BP 128/80   Pulse 79   Ht 5\' 2"  (1.575 m)   Wt 189 lb (85.7 kg)   SpO2 99%   BMI 34.57 kg/m  Body mass index is 34.57 kg/m. Wt Readings from Last 3 Encounters:  05/02/19 189 lb (85.7 kg)  01/10/19 195 lb (88.5 kg)  12/30/18 197 lb (89.4 kg)   Constitutional: overweight, in NAD Eyes: PERRLA, EOMI, no exophthalmos ENT: moist mucous membranes, no thyromegaly, no cervical lymphadenopathy Cardiovascular: RRR, No RG, +1/6 SEM, + L>R LE edema Respiratory: CTA B Gastrointestinal: abdomen soft, NT, ND, BS+ Musculoskeletal: no deformities, strength intact in all 4 Skin: moist, warm, no rashes Neurological: no tremor with outstretched hands, DTR normal in all 4  ASSESSMENT: 1. DM2, insulin-independent, uncontrolled, with complications - CAD, s/p AMI 06/2013 - Dr Acie Fredrickson - CHF - CKD - PN - DR - Dr. Jalene Mullet ([facility]) - on IO injections >> improving  2. Goiter - No neck compression  symptoms  - 10/19/2014: thyroid ultrasound: Right thyroid lobe: 4.0 x 1.5 x 1.6 cm. Heterogeneous parenchyma with  multiple small cysts identified. The largest measures approximately 0.5 x 0.3 x 0.5 cm. All of the small cysts in the right lobe appears simple and benign.  Left thyroid lobe: 4.5 x 1.4 x 2.0 cm. Heterogeneous parenchyma with multiple cysts. The largest measures approximately 1.0 x 0.4 x 0.5 cm. 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 Similar smaller cyst measures 0.8 cm in greatest diameter. There is a small solid nodule measuring 0.7 x 0.4 x 0.6 cm.  Isthmus Thickness: 0.4 cm. No nodules visualized.  Lymphadenopathy: None visualized.          Multicystic thyroid.  3. HL  4.  Obesity  PLAN:  1. Patient with longstanding, uncontrolled diabetes, only on glipizide ER.  We are usually following her with fructosamine levels, since her directly measured HbA1c levels are not very accurate for her.  We occasionally follow the is only to monitor trends. -Her sugars improved in the past after stopping Micardis and starting hydralazine.  She also lost weight and improved her diet.  At last visit, however, her sugars are higher after she started to drink juice, 1 cup 3 times a day.  I strongly advised her to stop.  She tells me she cannot eat fruit instead but was feeling that the juice was helping with her muscle cramps.  I suggested to start the magnesium supplement 400-500 mg daily.  She tells me that she stopped drinking juice at last visit but she does drink diet [place]. -At this visit, per review of her CBG log, sugars are higher during the period when she took prednisone and antibiotics, but now improved significantly in the last 3 to 4 weeks.  They are almost all at goal, with few exceptions.  I do not feel that we need to change her regimen for now. - I suggested to:  Patient Instructions  Please continue: - Glipizide ER 5 mg before  breakfast  Please return in 4 months with your sugar log  - we checked her HbA1c: 8% (decreased) -we will also check a fructosamine level today - advised to check sugars at different times of the day - 1x a day, rotating check times - advised for yearly eye exams >> she is UTD - return to clinic in 4 months      2. Goiter -Denies neck compression symptoms -Reviewed latest thyroid ultrasound report from 10/2016: Only small thyroid cysts -Latest TSH was reviewed and this was normal -No follow-up is necessary unless she develops neck compression symptoms  3. HL -Reviewed latest lipid panel from 01/2019: All fractions at goal Lab Results  Component Value Date   CHOL 122 01/10/2019   HDL 46.40 01/10/2019   LDLCALC 59 01/10/2019   TRIG 80.0 01/10/2019   CHOLHDL 3 01/10/2019  -She continues low-dose Crestor: 5 mg every other day without side effects  Office Visit on 05/02/2019  Component Date Value Ref Range Status  . Fructosamine 05/02/2019 321* 205 - 285 umol/L Final  . Hemoglobin A1C 05/02/2019 8.0* 4.0 - 5.6 % Final   HbA1c calculated from fructosamine is better than before, at 7%.  Philemon Kingdom, MD PhD [facility] Endocrinology

## 2019-05-02 NOTE — Addendum Note (Signed)
Addended by: Cardell Peach I on: 05/02/2019 02:01 PM   Modules accepted: Orders

## 2019-05-02 NOTE — Patient Instructions (Signed)
Please continue: - Glipizide ER 5 mg before breakfast  Please return in 4 months with your sugar log.  

## 2019-05-06 LAB — FRUCTOSAMINE: Fructosamine: 321 umol/L — ABNORMAL HIGH (ref 205–285)

## 2019-05-08 ENCOUNTER — Telehealth: Payer: Self-pay

## 2019-05-08 NOTE — Telephone Encounter (Signed)
Notified patient of message from Dr. Gherghe, patient expressed understanding and agreement. No further questions.  

## 2019-05-08 NOTE — Telephone Encounter (Signed)
-----   Message from Philemon Kingdom, MD sent at 05/08/2019  7:54 AM EST -----  Lenna Sciara, can you please call pt: HbA1c calculated from fructosamine is better than before, at 7%.

## 2019-05-10 DIAGNOSIS — H25811 Combined forms of age-related cataract, right eye: Secondary | ICD-10-CM | POA: Diagnosis not present

## 2019-05-10 DIAGNOSIS — H3582 Retinal ischemia: Secondary | ICD-10-CM | POA: Diagnosis not present

## 2019-05-10 DIAGNOSIS — E113511 Type 2 diabetes mellitus with proliferative diabetic retinopathy with macular edema, right eye: Secondary | ICD-10-CM | POA: Diagnosis not present

## 2019-05-15 ENCOUNTER — Other Ambulatory Visit: Payer: Self-pay

## 2019-05-15 ENCOUNTER — Encounter: Payer: Self-pay | Admitting: Cardiovascular Disease

## 2019-05-15 ENCOUNTER — Ambulatory Visit (INDEPENDENT_AMBULATORY_CARE_PROVIDER_SITE_OTHER): Payer: Medicare Other | Admitting: Cardiovascular Disease

## 2019-05-15 VITALS — BP 134/70 | HR 74 | Ht 62.0 in | Wt 193.1 lb

## 2019-05-15 DIAGNOSIS — I5042 Chronic combined systolic (congestive) and diastolic (congestive) heart failure: Secondary | ICD-10-CM | POA: Diagnosis not present

## 2019-05-15 DIAGNOSIS — I2510 Atherosclerotic heart disease of native coronary artery without angina pectoris: Secondary | ICD-10-CM | POA: Diagnosis not present

## 2019-05-15 NOTE — Progress Notes (Signed)
Cardiology Office Note   Date:  05/15/2019   ID:  [name], DOB 03-Feb-1951, MRN [id number]  PCP:  [name], MD  Cardiologist:   Mertie Moores, MD   No chief complaint on file.  1. Coronary artery disease-status post PTCA and stenting of her mid LAD using a 3.5 x 15 mm Alpine stent ( DES) ( Dec. 5, 2014)  2. chronic systolic congestive heart failure-ejection fraction of 35% 2. Hypertension 3. COPD 4. Diabetes mellitus 5. History colon cancer    [name] is seen today. She was initially seen by Dr. Mare Ferrari in the hospital. She has a history of chronic systolic congestive heart failure with an ejection fraction of around 35%. She has a history of coronary artery disease. She is status post PTCA and stenting of her mid left anterior descending artery using a 3.5 x 15 mm Alpine stent. She has a history of COPD. [name] not had a cigarette since he left the hospital. She also has a history of diabetes mellitus and history of colon cancer  She has mild interscapular pain this morning . And the pain resolved after she drank some club soda. Did not feel like her MI pain.  She has been walking every morining at the mall and at [place].   Her BP is up today. - she may have had lots of salt last night. The BP has remained high   September 20, 2013  [name] has been having some episodes of chest discomfort. She initially thought it might be due to indigestion or due to something that she ate. It radiated to her interscapular region. It was associated with some hand and normal tingling. The pain was intermittent but lasted for several hours. She took a sublingual nitroglycerin and the pain resolved very quickly and she did not have any further episodes the whole rest of the day.  She informed that she ran out of her Lasix 3 days ago.  November 01, 2013:  [name] is feeling well. She stopped smoking the day of her cath. She is eating more "penny candy" to replace  the habit.   January 31, 2014:  She is doing ok. She has no angina. Not exercising. 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  She has a bruise on her leg where she bumped into a table.   Oct. 29, 2015:  [name] is doing very well. She is not having any episodes of chest pain. [name] not exercising quite as much as she would like. Stent was placed in December of 2014. She had a nose bleed last week. ( she is on Effient until Dec. 2015). Her Bp was A bit elevated because of some salt in her diet.  No CP or dyspnea    November 02, 2014:  [name] is a 68 y.o. female who presents for follow-up of her coronary artery disease. [name] done fairly well. She has had some muscle pain and a rash. She has occasional episodes of dizziness. She had some non-cardiac CP several weeks ago when she started Trajenta.  Stopped the med and has not had any further episodes of CP.    Oct. 26, 2016  [name] is doing well She had a echo recently   Left ventricle: The cavity size was normal. Wall thickness was increased in a pattern of mild LVH with focal basal septal hypertrophy. Systolic function was normal. The estimated ejection fraction was in the range of 60% to 65%. Wall motion was normal;  there were no regional wall motion abnormalities. Doppler parameters are consistent with abnormal left ventricular relaxation (grade 1 diastolic dysfunction). - Aortic valve: There was no stenosis. There was trivial regurgitation. - Mitral valve: Mildly calcified annulus. Mildly calcified leaflets . There was no significant regurgitation. - Right ventricle: The cavity size was normal. Systolic function was normal. - Tricuspid valve: Peak RV-RA gradient (S): 21 mm Hg. - Pulmonary arteries: PA peak pressure: 24 mm Hg (S). - Inferior vena cava: The vessel was normal in size. The respirophasic diameter changes were in the normal range (>= 50%), consistent with normal central venous  pressure  [name] is doing well  BP has been elevated for the past 2 weeks.  Has had some left arm pain / ached Left arm aches all the time,  Constant.  Worse with sitting, not worsened by exercise .   October 28, 2015:  [name] is doing well.  She went with [facility] - it took 2 weeks for them to get her meds to her .  Has chronic left arm pain .  Oct. 23, 2017:  Doing well from a cardiac standpoint Having eye surgery . Has had a chronic cough [name], [name] Lacks, MD changed her Coreg to [facility] Did not seem to help the cough   Jan. 24, 2018:  doing well. Still eating some salty foods.  Still smoking some,  Advised her to quit.  Echo from 2016 shows normal LV systolic function Needs to have an contrast imaging study of her eye. She is at low risk for that procedure.   Aug. 17, 2018:  [name] is seen back today for follow up of her CHF   Dec 03, 2017:    [name] is seen back for follow up of her CHF and HTN Doing well. EF is now normal.   Gets some exercise - not as much as she would like  Has some ankle swelling  Has had some arm pain that she associated with the Losartan ( MSK pain is mentioned in [place])   Nov. 6, 2019:  [name] is seen for follow up visit  BP is well controlled  Had an episodes of CP while very upset ( her brother had just had a aortic dissection ( 10.5 hours of surgery )  Brother is 3 yo .    No further episodes of CP after the stress  Has a rash on the left side of the chest  She was not tolerating the micardis ( cough)  She started taking hydralazine on September 26.  She now has developed a rash on the left side of her chest.  She was started on torsemide instead of furosemide by her primary medical doctor.  She does okay when she takes the 50 mg of torsemide.  When she takes 100 mg of torsemide she has severe muscle cramping.  Nov. 9, 2020 :  No cp. No dyspnea Had some coughing in sept when she was exposed to marijuana smoke from  neighbors.   Had eye surgery last week.    Past Medical History:  Diagnosis Date  . Anemia   . COPD (chronic obstructive pulmonary disease) ([place])   . Diabetes mellitus   . Hyperlipidemia   . Hypertension   . Ischemic cardiomyopathy 06/11/2013  . Low back pain   . NSTEMI (non-ST elevated myocardial infarction) ([place]) 06/11/2013  . Obesity   . Personal history of colon cancer   . Renal insufficiency     Past Surgical History:  Procedure Laterality Date  . COLECTOMY    . LEFT HEART CATHETERIZATION WITH CORONARY ANGIOGRAM N/A 06/09/2013   Procedure: LEFT HEART CATHETERIZATION WITH CORONARY ANGIOGRAM;  Surgeon: Ramond Dial, MD;  Location: [facility] CATH LAB;  Service: Cardiovascular;  Laterality: N/A;     Current Outpatient Medications  Medication Sig Dispense Refill  . amLODipine (NORVASC) 10 MG tablet TAKE 1/2 TABLET BY MOUTH DAILY 15 tablet 2  . aspirin 81 MG chewable tablet Chew 1 tablet (81 mg total) by mouth daily.    . benzonatate (TESSALON) 200 MG capsule Take 1 capsule (200 mg total) by mouth 2 (two) times daily as needed for cough. 20 capsule 0  . betamethasone valerate ointment (VALISONE) 0.1 % Apply 1 application topically 2 (two) times daily. 15 g 0  . BYSTOLIC 10 MG tablet TAKE 1 TABLET BY MOUTH EVERY DAY 90 tablet 1  . calcium carbonate (OS-CAL) 600 MG TABS tablet Take 600 mg by mouth 2 (two) times daily with a meal.    . fluticasone (FLONASE) 50 MCG/ACT nasal spray Place 2 sprays into both nostrils daily as needed for allergies or rhinitis.    [name] glipiZIDE (GLUCOTROL XL) 5 MG 24 hr tablet Take 1 tablet (5 mg total) by mouth daily with breakfast. 90 tablet 3  . glucose blood (ONETOUCH ULTRA) test strip USE TO TEST BLOOD SUGAR TWO TIMES A DAY AS DIRECTED 200 each 3  . hydrALAZINE (APRESOLINE) 25 MG tablet TAKE 1 TABLET BY MOUTH THREE TIMES A DAY 270 tablet 2  . hydrOXYzine (ATARAX/VISTARIL) 25 MG tablet Take 1-2 tablets (25-50 mg total) by mouth every 8 (eight) hours as  needed for itching. 60 tablet 0  . lansoprazole (PREVACID) 15 MG capsule Take 1 capsule (15 mg total) by mouth daily at 12 noon. 90 capsule 3  . nitroGLYCERIN (NITROSTAT) 0.4 MG SL tablet DISSOLVE 1 TABLET UNDER THE TONGUE EVERY 5 MINUTES AS  NEEDED FOR CHEST PAIN 90 tablet 0  . ondansetron (ZOFRAN) 4 MG tablet Take 1 tablet (4 mg total) by mouth every 8 (eight) hours as needed for nausea or vomiting. 20 tablet 0  . potassium chloride SA (KLOR-CON) 20 MEQ tablet Take 1 tablet (20 mEq total) by mouth daily. 15 tablet 0  . rosuvastatin (CRESTOR) 5 MG tablet TAKE 1 TABLET BY MOUTH  DAILY AT 6PM 90 tablet 3  . torsemide (DEMADEX) 100 MG tablet TAKE 0.5-1 TABLETS (50-100 MG TOTAL) BY MOUTH DAILY. 90 tablet 3  . vitamin B-12 (CYANOCOBALAMIN) 1000 MCG tablet Take 1,000 mcg by mouth daily.     No current facility-administered medications for this visit.     Allergies:   Tradjenta [linagliptin], Atorvastatin, Enalapril maleate, Farxiga [dapagliflozin], Hydrochlorothiazide, Kenalog [triamcinolone acetonide], Metformin and related, Propoxyphene n-acetaminophen, Simvastatin, and Spironolactone    Social History:  The patient  reports that she has been smoking cigarettes. She has never used smokeless tobacco. She reports that she does not drink alcohol or use drugs.   Family History:  The patient's family history includes Hypertension in an other family member.    ROS:   Noted in current hx , otherwise negative   Physical Exam: Blood pressure 134/70, pulse 74, height 5\' 2"  (1.575 m), weight 193 lb 1.9 oz (87.6 kg), SpO2 99 %.  GEN:  Well nourished, well developed in no acute distress HEENT: Normal NECK: No JVD; No carotid bruits LYMPHATICS: No lymphadenopathy CARDIAC: RRR , no murmurs, rubs, gallops RESPIRATORY:  Clear to auscultation without rales, wheezing or  rhonchi  ABDOMEN: Soft, non-tender, non-distended MUSCULOSKELETAL:   1 + edema   SKIN: Warm and dry NEUROLOGIC:  Alert and oriented x  3   EKG:      May 15, 2019: Normal sinus rhythm at 74.  Normal EKG.  Recent Labs: 01/10/2019: ALT 12; BUN 68; Creatinine, Ser 2.60; Hemoglobin 10.9; Platelets 138.0; Potassium 4.4; Sodium 140; TSH 1.88    Lipid Panel    Component Value Date/Time   CHOL 122 01/10/2019 1018   CHOL 129 03/29/2018 1042   TRIG 80.0 01/10/2019 1018   HDL 46.40 01/10/2019 1018   HDL 46 03/29/2018 1042   CHOLHDL 3 01/10/2019 1018   VLDL 16.0 01/10/2019 1018   LDLCALC 59 01/10/2019 1018   LDLCALC 69 03/29/2018 1042      Wt Readings from Last 3 Encounters:  05/15/19 193 lb 1.9 oz (87.6 kg)  05/02/19 189 lb (85.7 kg)  01/10/19 195 lb (88.5 kg)      Other studies Reviewed: Additional studies/ records that were reviewed today include: . Review of the above records demonstrates:    ASSESSMENT AND PLAN:  1. Coronary artery disease-status post PTCA and stenting of her mid LAD using a 3.5 x 15 mm Alpine stent ( DES) ( Dec. 5, 2014)    No angina     2. chronic systolic congestive heart failure- initial ejection fraction of 35% -  Her last echocardiogram was in 2016.  She has normal left ventricular systolic function.   There is mild LVH with grade 1 diastolic dysfunction. It is clear that she still waiting too much salt.  She has hot dogs several times a week.  3.  Hypertensive heart disease with congestive heart failure  -   4.  Left leg edema: Still eating too much salt.  She is on fairly high-dose torsemide.  4. Diabetes mellitus 5. History colon cancer 6. COPD - encouraged smoking cessation   Current medicines are reviewed at length with the patient today.  The patient does not have concerns regarding medicines.    Labs/ tests ordered today include:   Orders Placed This Encounter  Procedures  . EKG 12-Lead  . ECHOCARDIOGRAM COMPLETE        Mertie Moores, MD  05/15/2019 12:16 PM    [facility] [address], [place], [place]  [address] Phone: [phone]; Fax: [phone]

## 2019-05-15 NOTE — Patient Instructions (Signed)
Medication Instructions:  Your physician recommends that you continue on your current medications as directed. Please refer to the Current Medication list given to you today.  *If you need a refill on your cardiac medications before your next appointment, please call your pharmacy*   Lab Work: None Ordered   Testing/Procedures: Your physician has requested that you have an echocardiogram. Echocardiography is a painless test that uses sound waves to create images of your heart. It provides your doctor with information about the size and shape of your heart and how well your heart's chambers and valves are working. This procedure takes approximately one hour. There are no restrictions for this procedure.    Follow-Up: At [facility], you and your health needs are our priority.  As part of our continuing mission to provide you with exceptional heart care, we have created designated Provider Care Teams.  These Care Teams include your primary Cardiologist (physician) and Advanced Practice Providers (APPs -  Physician Assistants and Nurse Practitioners) who all work together to provide you with the care you need, when you need it.  Your next appointment:   6 months  The format for your next appointment:   Either In Person or Virtual  Provider:   You may see Mertie Moores, MD or one of the following Advanced Practice Providers on your designated Care Team:    Richardson Dopp, PA-C  [place], [place]  Daune Perch, [place]

## 2019-05-16 ENCOUNTER — Other Ambulatory Visit: Payer: Self-pay

## 2019-05-16 ENCOUNTER — Ambulatory Visit (INDEPENDENT_AMBULATORY_CARE_PROVIDER_SITE_OTHER): Payer: Medicare Other | Admitting: Internal Medicine

## 2019-05-16 ENCOUNTER — Encounter: Payer: Self-pay | Admitting: Internal Medicine

## 2019-05-16 ENCOUNTER — Other Ambulatory Visit (INDEPENDENT_AMBULATORY_CARE_PROVIDER_SITE_OTHER): Payer: Medicare Other

## 2019-05-16 DIAGNOSIS — D509 Iron deficiency anemia, unspecified: Secondary | ICD-10-CM

## 2019-05-16 DIAGNOSIS — E1122 Type 2 diabetes mellitus with diabetic chronic kidney disease: Secondary | ICD-10-CM

## 2019-05-16 DIAGNOSIS — J309 Allergic rhinitis, unspecified: Secondary | ICD-10-CM

## 2019-05-16 DIAGNOSIS — N184 Chronic kidney disease, stage 4 (severe): Secondary | ICD-10-CM | POA: Diagnosis not present

## 2019-05-16 DIAGNOSIS — M5440 Lumbago with sciatica, unspecified side: Secondary | ICD-10-CM

## 2019-05-16 DIAGNOSIS — G8929 Other chronic pain: Secondary | ICD-10-CM

## 2019-05-16 DIAGNOSIS — I110 Hypertensive heart disease with heart failure: Secondary | ICD-10-CM

## 2019-05-16 DIAGNOSIS — E785 Hyperlipidemia, unspecified: Secondary | ICD-10-CM

## 2019-05-16 DIAGNOSIS — I5042 Chronic combined systolic (congestive) and diastolic (congestive) heart failure: Secondary | ICD-10-CM

## 2019-05-16 DIAGNOSIS — I2510 Atherosclerotic heart disease of native coronary artery without angina pectoris: Secondary | ICD-10-CM | POA: Diagnosis not present

## 2019-05-16 DIAGNOSIS — E1159 Type 2 diabetes mellitus with other circulatory complications: Secondary | ICD-10-CM | POA: Diagnosis not present

## 2019-05-16 DIAGNOSIS — E119 Type 2 diabetes mellitus without complications: Secondary | ICD-10-CM

## 2019-05-16 LAB — BASIC METABOLIC PANEL
BUN: 78 mg/dL — ABNORMAL HIGH (ref 6–23)
CO2: 23 mEq/L (ref 19–32)
Calcium: 9.6 mg/dL (ref 8.4–10.5)
Chloride: 103 mEq/L (ref 96–112)
Creatinine, Ser: 2.51 mg/dL — ABNORMAL HIGH (ref 0.40–1.20)
GFR: 19.03 mL/min — ABNORMAL LOW (ref >60.00)
Glucose, Bld: 151 mg/dL — ABNORMAL HIGH (ref 70–99)
Potassium: 3.8 mEq/L (ref 3.5–5.1)
Sodium: 136 mEq/L (ref 135–145)

## 2019-05-16 LAB — CBC WITH DIFFERENTIAL/PLATELET
Basophils Absolute: 0.1 10*3/uL (ref 0.0–0.1)
Basophils Relative: 1.2 % (ref 0.0–3.0)
Eosinophils Absolute: 0.1 10*3/uL (ref 0.0–0.7)
Eosinophils Relative: 1.2 % (ref 0.0–5.0)
HCT: 32.2 % — ABNORMAL LOW (ref 36.0–46.0)
Hemoglobin: 10.6 g/dL — ABNORMAL LOW (ref 12.0–15.0)
Lymphocytes Relative: 16.6 % (ref 12.0–46.0)
Lymphs Abs: 0.8 10*3/uL (ref 0.7–4.0)
MCHC: 32.7 g/dL (ref 30.0–36.0)
MCV: 88.9 fl (ref 78.0–100.0)
Monocytes Absolute: 0.4 10*3/uL (ref 0.1–1.0)
Monocytes Relative: 8.7 % (ref 3.0–12.0)
Neutro Abs: 3.6 10*3/uL (ref 1.4–7.7)
Neutrophils Relative %: 72.3 % (ref 43.0–77.0)
Platelets: 141 10*3/uL — ABNORMAL LOW (ref 150.0–400.0)
RBC: 3.63 Mil/uL — ABNORMAL LOW (ref 3.87–5.11)
RDW: 15.1 % (ref 11.5–15.5)
WBC: 5 10*3/uL (ref 4.0–10.5)

## 2019-05-16 LAB — IRON,TIBC AND FERRITIN PANEL
%SAT: 17 % (calc) (ref 16–45)
Ferritin: 115 ng/mL (ref 16–288)
Iron: 50 ug/dL (ref 45–160)
TIBC: 291 mcg/dL (calc) (ref 250–450)

## 2019-05-16 LAB — HEMOGLOBIN A1C: Hgb A1c MFr Bld: 8.4 % — ABNORMAL HIGH (ref 4.6–6.5)

## 2019-05-16 MED ORDER — NITROGLYCERIN 0.4 MG SL SUBL: SUBLINGUAL_TABLET | SUBLINGUAL | 3 refills | Status: DC

## 2019-05-16 NOTE — Assessment & Plan Note (Signed)
Restart Flonase

## 2019-05-16 NOTE — Assessment & Plan Note (Signed)
ECHO next Tue

## 2019-05-16 NOTE — Assessment & Plan Note (Signed)
Ultracet prn  Potential benefits of a long term opioids use as well as potential risks (i.e. addiction risk, apnea etc) and complications (i.e. Somnolence, constipation and others) were explained to the patient and were aknowledged.

## 2019-05-16 NOTE — Assessment & Plan Note (Signed)
Labs

## 2019-05-16 NOTE — Assessment & Plan Note (Signed)
CBC per Hematology

## 2019-05-16 NOTE — Assessment & Plan Note (Signed)
On Glipizide 

## 2019-05-16 NOTE — Progress Notes (Signed)
Subjective:  Patient ID: [name], female    DOB: [birth year]  Age: 68 y.o. MRN: [id number]  CC: No chief complaint on file.   HPI [name] presents for DM, HTN, CAD f/u  Outpatient Medications Prior to Visit  Medication Sig Dispense Refill  . amLODipine (NORVASC) 10 MG tablet TAKE 1/2 TABLET BY MOUTH DAILY 15 tablet 2  . aspirin 81 MG chewable tablet Chew 1 tablet (81 mg total) by mouth daily.    . benzonatate (TESSALON) 200 MG capsule Take 1 capsule (200 mg total) by mouth 2 (two) times daily as needed for cough. 20 capsule 0  . betamethasone valerate ointment (VALISONE) 0.1 % Apply 1 application topically 2 (two) times daily. 15 g 0  . BYSTOLIC 10 MG tablet TAKE 1 TABLET BY MOUTH EVERY DAY 90 tablet 1  . calcium carbonate (OS-CAL) 600 MG TABS tablet Take 600 mg by mouth 2 (two) times daily with a meal.    . fluticasone (FLONASE) 50 MCG/ACT nasal spray Place 2 sprays into both nostrils daily as needed for allergies or rhinitis.    [name] glipiZIDE (GLUCOTROL XL) 5 MG 24 hr tablet Take 1 tablet (5 mg total) by mouth daily with breakfast. 90 tablet 3  . glucose blood (ONETOUCH ULTRA) test strip USE TO TEST BLOOD SUGAR TWO TIMES A DAY AS DIRECTED 200 each 3  . hydrALAZINE (APRESOLINE) 25 MG tablet TAKE 1 TABLET BY MOUTH THREE TIMES A DAY 270 tablet 2  . hydrOXYzine (ATARAX/VISTARIL) 25 MG tablet Take 1-2 tablets (25-50 mg total) by mouth every 8 (eight) hours as needed for itching. 60 tablet 0  . lansoprazole (PREVACID) 15 MG capsule Take 1 capsule (15 mg total) by mouth daily at 12 noon. 90 capsule 3  . nitroGLYCERIN (NITROSTAT) 0.4 MG SL tablet DISSOLVE 1 TABLET UNDER THE TONGUE EVERY 5 MINUTES AS  NEEDED FOR CHEST PAIN 90 tablet 0  . ondansetron (ZOFRAN) 4 MG tablet Take 1 tablet (4 mg total) by mouth every 8 (eight) hours as needed for nausea or vomiting. 20 tablet 0  . potassium chloride SA (KLOR-CON) 20 MEQ tablet Take 1 tablet (20 mEq total) by mouth daily. 15 tablet 0   . rosuvastatin (CRESTOR) 5 MG tablet TAKE 1 TABLET BY MOUTH  DAILY AT 6PM 90 tablet 3  . torsemide (DEMADEX) 100 MG tablet TAKE 0.5-1 TABLETS (50-100 MG TOTAL) BY MOUTH DAILY. 90 tablet 3  . vitamin B-12 (CYANOCOBALAMIN) 1000 MCG tablet Take 1,000 mcg by mouth daily.     No facility-administered medications prior to visit.     ROS: Review of Systems  Constitutional: Positive for fatigue. Negative for activity change, appetite change, chills and unexpected weight change.  HENT: Negative for congestion, mouth sores and sinus pressure.   Eyes: Negative for visual disturbance.  Respiratory: Negative for cough and chest tightness.   Gastrointestinal: Negative for abdominal pain and nausea.  Genitourinary: Negative for difficulty urinating, frequency and vaginal pain.  Musculoskeletal: Positive for arthralgias, back pain and gait problem.  Skin: Negative for pallor and rash.  Neurological: Negative for dizziness, tremors, weakness, numbness and headaches.  Psychiatric/Behavioral: Positive for dysphoric mood. Negative for confusion, sleep disturbance and suicidal ideas.    Objective:  BP 138/68 (BP Location: Left Arm, Patient Position: Sitting, Cuff Size: Large)   Pulse 80   Temp 97.8 F (36.6 C) (Oral)   Ht 5\' 2"  (1.575 m)   Wt 193 lb (87.5 kg)   SpO2 98%  BMI 35.30 kg/m   BP Readings from Last 3 Encounters:  05/16/19 138/68  05/15/19 134/70  05/02/19 128/80    Wt Readings from Last 3 Encounters:  05/16/19 193 lb (87.5 kg)  05/15/19 193 lb 1.9 oz (87.6 kg)  05/02/19 189 lb (85.7 kg)    Physical Exam Constitutional:      General: She is not in acute distress.    Appearance: She is well-developed. She is obese.  HENT:     Head: Normocephalic.     Right Ear: External ear normal.     Left Ear: External ear normal.     Nose: Nose normal.  Eyes:     General:        Right eye: No discharge.        Left eye: No discharge.     Conjunctiva/sclera: Conjunctivae normal.      Pupils: Pupils are equal, round, and reactive to light.  Neck:     Musculoskeletal: Normal range of motion and neck supple.     Thyroid: No thyromegaly.     Vascular: No JVD.     Trachea: No tracheal deviation.  Cardiovascular:     Rate and Rhythm: Normal rate and regular rhythm.     Heart sounds: Normal heart sounds.  Pulmonary:     Effort: No respiratory distress.     Breath sounds: No stridor. No wheezing.  Abdominal:     General: Bowel sounds are normal. There is no distension.     Palpations: Abdomen is soft. There is no mass.     Tenderness: There is no abdominal tenderness. There is no guarding or rebound.  Musculoskeletal:        General: Swelling and tenderness present.  Lymphadenopathy:     Cervical: No cervical adenopathy.  Skin:    Findings: No erythema or rash.  Neurological:     Cranial Nerves: No cranial nerve deficit.     Motor: No abnormal muscle tone.     Coordination: Coordination abnormal.     Gait: Gait abnormal.     Deep Tendon Reflexes: Reflexes normal.  Psychiatric:        Behavior: Behavior normal.        Thought Content: Thought content normal.        Judgment: Judgment normal.     Lab Results  Component Value Date   WBC 5.0 01/10/2019   HGB 10.9 (L) 01/10/2019   HCT 33.3 (L) 01/10/2019   PLT 138.0 (L) 01/10/2019   GLUCOSE 184 (H) 01/10/2019   CHOL 122 01/10/2019   TRIG 80.0 01/10/2019   HDL 46.40 01/10/2019   LDLCALC 59 01/10/2019   ALT 12 01/10/2019   AST 12 01/10/2019   NA 140 01/10/2019   K 4.4 01/10/2019   CL 104 01/10/2019   CREATININE 2.60 (H) 01/10/2019   BUN 68 (H) 01/10/2019   CO2 26 01/10/2019   TSH 1.88 01/10/2019   INR 1.16 06/07/2013   HGBA1C 8.0 (A) 05/02/2019   MICROALBUR 13.9 (H) 10/13/2007    Dg Bone Density (dxa)  Result Date: 12/19/2018 EXAM: DUAL X-RAY ABSORPTIOMETRY (DXA) FOR BONE MINERAL DENSITY IMPRESSION: Referring Physician:  Salvadore Dom Your patient completed a BMD test using Lunar IDXA DXA  system ( analysis version: 16 ) manufactured by [employer]. Technologist: AW PATIENT: Name: [name], [name] Patient ID: [id number] Birth Date: 1951/04/28 Height: 61.0 in. Sex: Female Measured: 12/19/2018 Weight: 196.2 lbs. Indications: Estrogen Deficient, Postmenopausal, Tobacco User (Current Smoker) Fractures: None Treatments: Calcium (  E943.0), Vitamin D (E933.5) ASSESSMENT: The BMD measured at Femur Total Left is 0.893 g/cm2 with a T-score of -0.9. This patient is considered normal according to [facility]) criteria. The scan quality is good. L-2 and L-3 were excluded due to degenerative changes. Site Region Measured Date Measured Age YA BMD Significant CHANGE T-score DualFemur Total Left 12/19/2018 68.1 -0.9 0.893 g/cm2 AP Spine L1-L4 (L2,L3) 12[age]19/2018 68.1 0.8 1.266 g/cm2 DualFemur Total Mean 12/19/2018 68.1 -0.8 0.904 g/cm2 World Health Organization [facility]) criteria for post-menopausal, Caucasian Women: Normal       T-score at or above -1 SD Osteopenia   T-score between -1 and -2.5 SD Osteoporosis T-score at or below -2.5 SD RECOMMENDATION: 1. All patients should optimize calcium and vitamin D intake. 2. Consider FDA approved medical therapies in postmenopausal women and men aged 29 years and older, based on the following: a. A hip or vertebral (clinical or morphometric) fracture b. T- score < or = -2.5 at the femoral neck or spine after appropriate evaluation to exclude secondary causes c. Low bone mass (T-score between -1.0 and -2.5 at the femoral neck or spine) and a 10 year probability of a hip fracture > or = 3% or a 10 year probability of a major osteoporosis-related fracture > or = 20% based on the [place]-adapted WHO algorithm d. Clinician judgment and/or patient preferences may indicate treatment for people with 10-year fracture probabilities above or below these levels FOLLOW-UP: Patients with diagnosis of osteoporosis or at high risk for fracture should have regular bone mineral  density tests. For patients eligible for Medicare, routine testing is allowed once every 2 years. The testing frequency can be increased to one year for patients who have rapidly progressing disease, those who are receiving or discontinuing medical therapy to restore bone mass, or have additional risk factors. I have reviewed this report and agree with the above findings. [facility] Radiology Electronically Signed   By: Fidela Salisbury M.D.   On: 12/19/2018 14:20   Mm Digital Screening Bilateral  Result Date: 12/20/2018 CLINICAL DATA:  Screening. EXAM: DIGITAL SCREENING BILATERAL MAMMOGRAM WITH CAD COMPARISON:  Previous exam(s). ACR Breast Density Category a: The breast tissue is almost entirely fatty. FINDINGS: There are no findings suspicious for malignancy. Images were processed with CAD. IMPRESSION: No mammographic evidence of malignancy. A result letter of this screening mammogram will be mailed directly to the patient. RECOMMENDATION: Screening mammogram in one year. (Code:[id number]-B-01Y) BI-RADS CATEGORY  1: Negative. Electronically Signed   By: Margarette Canada M.D.   On: 12/20/2018 11:33    Assessment & Plan:   There are no diagnoses linked to this encounter.   No orders of the defined types were placed in this encounter.    Follow-up: No follow-ups on file.  Walker Kehr, MD

## 2019-05-16 NOTE — Assessment & Plan Note (Signed)
F/u w/Cardiology 

## 2019-05-16 NOTE — Assessment & Plan Note (Signed)
ASA, Crestor No CP

## 2019-05-23 ENCOUNTER — Other Ambulatory Visit: Payer: Self-pay

## 2019-05-23 ENCOUNTER — Ambulatory Visit (HOSPITAL_COMMUNITY): Payer: Medicare Other | Attending: Cardiology

## 2019-05-23 DIAGNOSIS — I2510 Atherosclerotic heart disease of native coronary artery without angina pectoris: Secondary | ICD-10-CM | POA: Diagnosis not present

## 2019-05-23 DIAGNOSIS — I5042 Chronic combined systolic (congestive) and diastolic (congestive) heart failure: Secondary | ICD-10-CM

## 2019-05-25 ENCOUNTER — Telehealth: Payer: Self-pay | Admitting: Cardiovascular Disease

## 2019-05-25 NOTE — Telephone Encounter (Signed)
Reviewed echo results with patient who verbalized understanding. I advised her to continue current medications and call back with questions or concerns prior to next appointment. She thanked me for the call.

## 2019-05-25 NOTE — Telephone Encounter (Signed)
New message   Patient is returning call for echo results. Please call. 

## 2019-05-30 DIAGNOSIS — E113511 Type 2 diabetes mellitus with proliferative diabetic retinopathy with macular edema, right eye: Secondary | ICD-10-CM | POA: Diagnosis not present

## 2019-06-22 ENCOUNTER — Other Ambulatory Visit: Payer: Self-pay | Admitting: Internal Medicine

## 2019-07-20 DIAGNOSIS — H25813 Combined forms of age-related cataract, bilateral: Secondary | ICD-10-CM | POA: Diagnosis not present

## 2019-07-20 DIAGNOSIS — E113511 Type 2 diabetes mellitus with proliferative diabetic retinopathy with macular edema, right eye: Secondary | ICD-10-CM | POA: Diagnosis not present

## 2019-07-20 DIAGNOSIS — N2581 Secondary hyperparathyroidism of renal origin: Secondary | ICD-10-CM | POA: Diagnosis not present

## 2019-07-20 DIAGNOSIS — E1122 Type 2 diabetes mellitus with diabetic chronic kidney disease: Secondary | ICD-10-CM | POA: Diagnosis not present

## 2019-07-20 DIAGNOSIS — N189 Chronic kidney disease, unspecified: Secondary | ICD-10-CM | POA: Diagnosis not present

## 2019-07-20 DIAGNOSIS — N184 Chronic kidney disease, stage 4 (severe): Secondary | ICD-10-CM | POA: Diagnosis not present

## 2019-07-20 DIAGNOSIS — D631 Anemia in chronic kidney disease: Secondary | ICD-10-CM | POA: Diagnosis not present

## 2019-07-20 DIAGNOSIS — I129 Hypertensive chronic kidney disease with stage 1 through stage 4 chronic kidney disease, or unspecified chronic kidney disease: Secondary | ICD-10-CM | POA: Diagnosis not present

## 2019-07-20 DIAGNOSIS — E113512 Type 2 diabetes mellitus with proliferative diabetic retinopathy with macular edema, left eye: Secondary | ICD-10-CM | POA: Diagnosis not present

## 2019-07-20 DIAGNOSIS — H04123 Dry eye syndrome of bilateral lacrimal glands: Secondary | ICD-10-CM | POA: Diagnosis not present

## 2019-07-20 DIAGNOSIS — H3582 Retinal ischemia: Secondary | ICD-10-CM | POA: Diagnosis not present

## 2019-08-18 DIAGNOSIS — E113511 Type 2 diabetes mellitus with proliferative diabetic retinopathy with macular edema, right eye: Secondary | ICD-10-CM | POA: Diagnosis not present

## 2019-09-06 ENCOUNTER — Encounter: Payer: Self-pay | Admitting: Internal Medicine

## 2019-09-06 ENCOUNTER — Other Ambulatory Visit: Payer: Self-pay

## 2019-09-06 ENCOUNTER — Ambulatory Visit (INDEPENDENT_AMBULATORY_CARE_PROVIDER_SITE_OTHER): Payer: Medicare Other | Admitting: Internal Medicine

## 2019-09-06 VITALS — BP 120/80 | HR 78 | Ht 62.0 in | Wt 192.0 lb

## 2019-09-06 DIAGNOSIS — E1159 Type 2 diabetes mellitus with other circulatory complications: Secondary | ICD-10-CM | POA: Diagnosis not present

## 2019-09-06 DIAGNOSIS — E049 Nontoxic goiter, unspecified: Secondary | ICD-10-CM | POA: Diagnosis not present

## 2019-09-06 DIAGNOSIS — E785 Hyperlipidemia, unspecified: Secondary | ICD-10-CM | POA: Diagnosis not present

## 2019-09-06 LAB — POCT GLYCOSYLATED HEMOGLOBIN (HGB A1C): Hemoglobin A1C: 7.3 % — AB (ref 4.0–5.6)

## 2019-09-06 NOTE — Progress Notes (Signed)
Patient ID: [name], female   DOB: 02-10-1951, 69 y.o.   MRN: [id number]  This visit occurred during the SARS-CoV-2 public health emergency.  Safety protocols were in place, including screening questions prior to the visit, additional usage of staff PPE, and extensive cleaning of exam room while observing appropriate contact time as indicated for disinfecting solutions.   HPI: [name] is a 69 y.o.-year-old female, returning for f/u DM2, dx in 2006, insulin-independent, uncontrolled, with complications (CAD - s/p AMI 06/2013, CHF; CKD; PN; DR). Last visit 4 months ago.  She eliminated sweet drinks (drinks diet cranberry juice) also eliminated icecreams, cakes, since last OV >> sugars much better!  Reviewed HbA1c levels: Lab Results  Component Value Date   HGBA1C 8.4 (H) 05/16/2019   HGBA1C 8.0 (A) 05/02/2019   HGBA1C 9.0 (A) 12/30/2018  05/02/2019: HbA1c calculated from fructosamine was 7.0% 12/30/2018: HbA1c calculated from fructosamine is higher, at 8.2%, possibly 2/2 drinking juice 08/24/2018: HbA1c calculated from fructosamine is higher, at 8%, possibly 2/2 ABx  04/20/2018: HbA1c calculated from fructosamine is slightly better than before, at 7% 12/15/2017: HbA1c calculated from fructosamine: 7.17%, slightly improved from last visit  08/17/2017: HbA1c calculated from fructosamine: 7.2%, slightly improved 04/15/2017: HbA1c calculated from the fructosamine is lower than measured, at 7.3% 08/21/2016: HbA1c 6.0%.  She refused insulin in the past She started to change her diet after her hemoglobin A1c returned high, at 16.2% in 09/2014. She cut down portions, changed the meal content to include more fiber and less carbs.  HbA1c decreased dramatically.  Pt is on: - Glipizide ER 5 mg in a.m.-started 2016 She stopped metformin ER in 01/2017. She tried Tradjenta 5 mg daily in am >> CP with it (started 10/2014) >> had to stop She tried [place] >> decreased [name] and yeast  inf. She tried regular metformin >> diarrhea  Pt checks her sugars twice a day per review of her log: - am: 105-140, 148 (on Prednisone: 233-269) >> 95-128 - 2h after b'fast: 99-130, 140 >> 108-147 >> 108-130 - before lunch: 127-159, 168 >> 132-159, 168 (snack) >> 143 >> 112 - 2h after lunch: 111, 135-166 >> 133-182 >> 169 >> 120-130 - before dinner: 89-168, 171 >> 68, 132-171 >> 118-146 >> 110-144 - 2h after dinner: 120-177 >> 100-157, 177(prednisone: 300) >> 120-148 - bedtime:   n/c >> 137 >> n/c >> 147-157 >> n/c - nighttime: n/c Lowest sugar was 60 >> 89 >> 68; is unclear at which level she has hypoglycemia awareness. Highest sugar was  182 >> 300 (Prednisone) >> 169.  Glucometer: One Touch Ultra  Pt's meals are: - Breakfast: oatmeal, cereals, [place] bacon, egg toast - Lunch: sandwich or fruit salad - Dinner: salmon/chicken, Brussel sprouts, other veggies, sweet potatoes or brown rice, spaghetti - Snacks: 2: carrots with dip; yoghurt with fruit, veggies + dip She walks for exercise. She saw Dr. Hollie Salk with nephrology.  She was started on a high potassium- low protein diet.  -She has CKD and sees nephrology.  Latest BUN/creatinine: Lab Results  Component Value Date   BUN 78 (H) 05/16/2019   CREATININE 2.51 (H) 05/16/2019  She stopped olmesartan due to worsening kidney function and also cough. -+ HL; last set of lipids: Lab Results  Component Value Date   CHOL 122 01/10/2019   HDL 46.40 01/10/2019   LDLCALC 59 01/10/2019   TRIG 80.0 01/10/2019   CHOLHDL 3 01/10/2019  On Crestor 5 every other day. - last eye exam was  in 04/2019: + DR, + cataract.  Had laser surgery. Dr. Posey Pronto. -+ Numbness and tingling in her feet.  She also has a history of HTN, GERD, and anemia.  She has a history of goiter but the thyroid ultrasound in 2018 showed only small cysts, though not  Pt denies: - feeling nodules in neck - hoarseness - dysphagia - choking - SOB with lying down  Latest  TSH was normal: Lab Results  Component Value Date   TSH 1.88 01/10/2019   ROS: Constitutional: no weight gain/no weight loss, no fatigue, no subjective hyperthermia, no subjective hypothermia Eyes: no blurry vision, no xerophthalmia ENT: no sore throat, + see HPI Cardiovascular: no CP/no SOB/no palpitations/no leg swelling Respiratory: no cough/no SOB/no wheezing Gastrointestinal: no N/no V/no D/no C/no acid reflux Musculoskeletal: no muscle aches/no joint aches Skin: no rashes, no hair loss Neurological: no tremors/+ numbness/+ tingling/no dizziness  I reviewed pt's medications, allergies, PMH, social hx, family hx, and changes were documented in the history of present illness. Otherwise, unchanged from my initial visit note.  Past Medical History:  Diagnosis Date  . Anemia   . COPD (chronic obstructive pulmonary disease) ([place])   . Diabetes mellitus   . Hyperlipidemia   . Hypertension   . Ischemic cardiomyopathy 06/11/2013  . Low back pain   . NSTEMI (non-ST elevated myocardial infarction) ([place]) 06/11/2013  . Obesity   . Personal history of colon cancer   . Renal insufficiency    Past Surgical History:  Procedure Laterality Date  . COLECTOMY    . LEFT HEART CATHETERIZATION WITH CORONARY ANGIOGRAM N/A 06/09/2013   Procedure: LEFT HEART CATHETERIZATION WITH CORONARY ANGIOGRAM;  Surgeon: Ramond Dial, MD;  Location: [facility] CATH LAB;  Service: Cardiovascular;  Laterality: N/A;   History   Social History  . Marital Status: Single    Spouse Name: N/A  . Number of Children: 0   Occupational History  . retired   Social History Main Topics  . Smoking status:  former smoker     Types: Cigarettes  . Smokeless tobacco: Never Used  . Alcohol Use: No  . Drug Use: No   Current Outpatient Medications on File Prior to Visit  Medication Sig Dispense Refill  . amLODipine (NORVASC) 10 MG tablet TAKE 1/2 TABLET BY MOUTH DAILY 15 tablet 2  . aspirin 81 MG chewable tablet Chew 1  tablet (81 mg total) by mouth daily.    . benzonatate (TESSALON) 200 MG capsule Take 1 capsule (200 mg total) by mouth 2 (two) times daily as needed for cough. 20 capsule 0  . betamethasone valerate ointment (VALISONE) 0.1 % Apply 1 application topically 2 (two) times daily. 15 g 0  . BYSTOLIC 10 MG tablet TAKE 1 TABLET BY MOUTH EVERY DAY 90 tablet 1  . calcium carbonate (OS-CAL) 600 MG TABS tablet Take 600 mg by mouth 2 (two) times daily with a meal.    . fluticasone (FLONASE) 50 MCG/ACT nasal spray Place 2 sprays into both nostrils daily as needed for allergies or rhinitis.    [name] glipiZIDE (GLUCOTROL XL) 5 MG 24 hr tablet Take 1 tablet (5 mg total) by mouth daily with breakfast. 90 tablet 3  . glucose blood (ONETOUCH ULTRA) test strip USE TO TEST BLOOD SUGAR TWO TIMES A DAY AS DIRECTED 200 each 3  . hydrALAZINE (APRESOLINE) 25 MG tablet TAKE 1 TABLET BY MOUTH THREE TIMES A DAY 270 tablet 2  . hydrOXYzine (ATARAX/VISTARIL) 25 MG tablet Take 1-2 tablets (  25-50 mg total) by mouth every 8 (eight) hours as needed for itching. 60 tablet 0  . lansoprazole (PREVACID) 15 MG capsule Take 1 capsule (15 mg total) by mouth daily at 12 noon. 90 capsule 3  . nitroGLYCERIN (NITROSTAT) 0.4 MG SL tablet DISSOLVE 1 TABLET UNDER THE TONGUE EVERY 5 MINUTES AS  NEEDED FOR CHEST PAIN 20 tablet 3  . ondansetron (ZOFRAN) 4 MG tablet Take 1 tablet (4 mg total) by mouth every 8 (eight) hours as needed for nausea or vomiting. 20 tablet 0  . potassium chloride SA (KLOR-CON) 20 MEQ tablet Take 1 tablet (20 mEq total) by mouth daily. 15 tablet 0  . rosuvastatin (CRESTOR) 5 MG tablet TAKE 1 TABLET BY MOUTH  DAILY AT 6PM 90 tablet 3  . torsemide (DEMADEX) 100 MG tablet TAKE 0.5-1 TABLETS (50-100 MG TOTAL) BY MOUTH DAILY. 90 tablet 3  . vitamin B-12 (CYANOCOBALAMIN) 1000 MCG tablet Take 1,000 mcg by mouth daily.     No current facility-administered medications on file prior to visit.   Allergies  Allergen Reactions  .  Tradjenta [Linagliptin] Anaphylaxis    CP  . Atorvastatin     REACTION: aches and pains  . Enalapril Maleate     REACTION: cough  . Farxiga [Dapagliflozin] Itching  . Hydrochlorothiazide     REACTION: hair loss  . Kenalog [Triamcinolone Acetonide]     HANDS NUMB   . Metformin And Related     Diarrhea, dizziness  . Propoxyphene N-Acetaminophen Hives  . Simvastatin     REACTION: cramps  . Spironolactone     REACTION: cramps   Family History  Problem Relation Age of Onset  . Hypertension Other    PE: BP 120/80   Pulse 78   Ht 5\' 2"  (1.575 m)   Wt 192 lb (87.1 kg)   SpO2 99%   BMI 35.12 kg/m  Body mass index is 35.12 kg/m. Wt Readings from Last 3 Encounters:  09/06/19 192 lb (87.1 kg)  05/16/19 193 lb (87.5 kg)  05/15/19 193 lb 1.9 oz (87.6 kg)   Constitutional: overweight, in NAD Eyes: PERRLA, EOMI, no exophthalmos ENT: moist mucous membranes, no thyromegaly, no cervical lymphadenopathy Cardiovascular: RRR, No RG, + SEM 1/6, +L>R LE edema Respiratory: CTA B Gastrointestinal: abdomen soft, NT, ND, BS+ Musculoskeletal: no deformities, strength intact in all 4 Skin: moist, warm, no rashes Neurological: no tremor with outstretched hands, DTR normal in all 4  ASSESSMENT: 1. DM2, insulin-independent, uncontrolled, with complications - CAD, s/p AMI 06/2013 - Dr Acie Fredrickson - CHF - CKD - PN - DR - Dr. Jalene Mullet ([facility]) - on IO injections >> improving  2. Goiter - No neck compression symptoms  - 10/19/2014: thyroid ultrasound: Right thyroid lobe: 4.0 x 1.5 x 1.6 cm. Heterogeneous parenchyma with multiple small cysts identified. The largest measures approximately 0.5 x 0.3 x 0.5 cm. All of the small cysts in the right lobe appears simple and benign.  Left thyroid lobe: 4.5 x 1.4 x 2.0 cm. Heterogeneous parenchyma with multiple cysts. The largest measures approximately 1.0 x 0.4 x 0.5 cm. This has minimal internal echogenicity and likely represents a colloid  cyst. Similar smaller cyst measures 0.8 cm in greatest diameter. There is a small solid nodule measuring 0.7 x 0.4 x 0.6 cm.  Isthmus Thickness: 0.4 cm. No nodules visualized.  Lymphadenopathy: None visualized.          Multicystic thyroid.  3. HL  PLAN:  1. Patient with longstanding, uncontrolled  diabetes, only on glipizide ER.  We are following her usually with glucose to be levels, since her directly measured HbA1c levels are not very accurate for her.  We occasionally follow the HbA1c only in the morning trends.  At last visit, her HbA1c directly measured was 8%, however the HbA1c evaluated from fructosamine was better and correlating better with her sugars at home, at 7.0%. -Her sugars improved after starting Micardis and starting hydralazine.  She also lost weight and improved her diet.  However, she started to drink juice afterwards and sugars increased and I strongly advised her to stop.  She mentioned that she was drinking juice only to help grams and I advised her to start magnesium supplement.  At last visit, she was still drinking Ocean Spray, which I again strongly advised him to stop.  Sugars were higher at that time as she was also on prednisone and biotics but they improved in the 3 to 4 weeks before our last appointment.  We did not change the regimen. -At this visit, sugars are much better after she changed her diet and eliminated concentrated sweets and also more fatty foods.  She still eats eggs and I advised her to reduce these due to high cholesterol content, also.  However, overall, her sugars improved significantly and, indeed, her HbA1c checked today was much better at 7.3%. due to the significant improvement in her HbA1c, we will not check a fructosamine level today.   -We will continue the current regimen.  No low blood sugars except for one CBG in the 60s when she had to be n.p.o. for the whole day (did not have glipizide day) for eye surgery Patient Instructions   Please continue: - Glipizide ER 5 mg before breakfast  Please return in 4 months with your sugar log.  - advised to check sugars at different times of the day - 1x a day, rotating check times - advised for yearly eye exams >> she is UTD - return to clinic in 4 months     2. Goiter -No neck compression symptoms -Review latest thyroid ultrasound report from 10/2016: Small thyroid cysts -Latest TSH was reviewed: Normal -No follow-up is necessary unless she develops neck compression symptoms  3. HL -Reviewed latest lipid panel from 01/2019: All fractions at goal: Lab Results  Component Value Date   CHOL 122 01/10/2019   HDL 46.40 01/10/2019   LDLCALC 59 01/10/2019   TRIG 80.0 01/10/2019   CHOLHDL 3 01/10/2019  -She is on low-dose Crestor: 5 mg every other day without side effects   Philemon Kingdom, MD PhD [facility] Endocrinology

## 2019-09-06 NOTE — Addendum Note (Signed)
Addended by: Cardell Peach I on: 09/06/2019 11:10 AM   Modules accepted: Orders

## 2019-09-06 NOTE — Patient Instructions (Signed)
Please continue: - Glipizide ER 5 mg before breakfast  Please return in 4 months with your sugar log.  

## 2019-09-08 DIAGNOSIS — E113512 Type 2 diabetes mellitus with proliferative diabetic retinopathy with macular edema, left eye: Secondary | ICD-10-CM | POA: Diagnosis not present

## 2019-09-13 ENCOUNTER — Ambulatory Visit: Payer: Medicare Other | Admitting: Internal Medicine

## 2019-09-21 ENCOUNTER — Ambulatory Visit (INDEPENDENT_AMBULATORY_CARE_PROVIDER_SITE_OTHER): Payer: Medicare Other | Admitting: Internal Medicine

## 2019-09-21 ENCOUNTER — Encounter: Payer: Self-pay | Admitting: Internal Medicine

## 2019-09-21 ENCOUNTER — Other Ambulatory Visit: Payer: Self-pay

## 2019-09-21 DIAGNOSIS — I110 Hypertensive heart disease with heart failure: Secondary | ICD-10-CM

## 2019-09-21 DIAGNOSIS — E1159 Type 2 diabetes mellitus with other circulatory complications: Secondary | ICD-10-CM

## 2019-09-21 DIAGNOSIS — I5042 Chronic combined systolic (congestive) and diastolic (congestive) heart failure: Secondary | ICD-10-CM | POA: Diagnosis not present

## 2019-09-21 DIAGNOSIS — E1122 Type 2 diabetes mellitus with diabetic chronic kidney disease: Secondary | ICD-10-CM

## 2019-09-21 DIAGNOSIS — N184 Chronic kidney disease, stage 4 (severe): Secondary | ICD-10-CM

## 2019-09-21 DIAGNOSIS — Z6837 Body mass index (BMI) 37.0-37.9, adult: Secondary | ICD-10-CM

## 2019-09-21 MED ORDER — VITAMIN D3 50 MCG (2000 UT) PO CAPS: 2000.0000 [IU] | ORAL_CAPSULE | Freq: Every day | ORAL | 3 refills | Status: AC

## 2019-09-21 NOTE — Assessment & Plan Note (Signed)
Pt had labs in Jan 2021 w/Dr Hollie Salk

## 2019-09-21 NOTE — Assessment & Plan Note (Signed)
Compensated 

## 2019-09-21 NOTE — Assessment & Plan Note (Signed)
Wt Readings from Last 3 Encounters:  09/21/19 192 lb (87.1 kg)  09/06/19 192 lb (87.1 kg)  05/16/19 193 lb (87.5 kg)

## 2019-09-21 NOTE — Progress Notes (Signed)
Subjective:  Patient ID: [name], female    DOB: July 07, 1950  Age: 69 y.o. MRN: [id number]  CC: No chief complaint on file.   HPI [name] presents for HTN, CAD, LBP, CRI f/u Feeling well Refusing COVID19 vaccination...  Outpatient Medications Prior to Visit  Medication Sig Dispense Refill  . amLODipine (NORVASC) 10 MG tablet TAKE 1/2 TABLET BY MOUTH DAILY 15 tablet 2  . aspirin 81 MG chewable tablet Chew 1 tablet (81 mg total) by mouth daily.    . betamethasone valerate ointment (VALISONE) 0.1 % Apply 1 application topically 2 (two) times daily. 15 g 0  . BYSTOLIC 10 MG tablet TAKE 1 TABLET BY MOUTH EVERY DAY 90 tablet 1  . calcium carbonate (OS-CAL) 600 MG TABS tablet Take 600 mg by mouth 2 (two) times daily with a meal.    . fluticasone (FLONASE) 50 MCG/ACT nasal spray Place 2 sprays into both nostrils daily as needed for allergies or rhinitis.    [name] glipiZIDE (GLUCOTROL XL) 5 MG 24 hr tablet Take 1 tablet (5 mg total) by mouth daily with breakfast. 90 tablet 3  . glucose blood (ONETOUCH ULTRA) test strip USE TO TEST BLOOD SUGAR TWO TIMES A DAY AS DIRECTED 200 each 3  . hydrALAZINE (APRESOLINE) 25 MG tablet TAKE 1 TABLET BY MOUTH THREE TIMES A DAY 270 tablet 2  . hydrOXYzine (ATARAX/VISTARIL) 25 MG tablet Take 1-2 tablets (25-50 mg total) by mouth every 8 (eight) hours as needed for itching. 60 tablet 0  . lansoprazole (PREVACID) 15 MG capsule Take 1 capsule (15 mg total) by mouth daily at 12 noon. 90 capsule 3  . nitroGLYCERIN (NITROSTAT) 0.4 MG SL tablet DISSOLVE 1 TABLET UNDER THE TONGUE EVERY 5 MINUTES AS  NEEDED FOR CHEST PAIN 20 tablet 3  . ondansetron (ZOFRAN) 4 MG tablet Take 1 tablet (4 mg total) by mouth every 8 (eight) hours as needed for nausea or vomiting. 20 tablet 0  . potassium chloride SA (KLOR-CON) 20 MEQ tablet Take 1 tablet (20 mEq total) by mouth daily. 15 tablet 0  . rosuvastatin (CRESTOR) 5 MG tablet TAKE 1 TABLET BY MOUTH  DAILY AT 6PM 90  tablet 3  . torsemide (DEMADEX) 100 MG tablet TAKE 0.5-1 TABLETS (50-100 MG TOTAL) BY MOUTH DAILY. 90 tablet 3  . vitamin B-12 (CYANOCOBALAMIN) 1000 MCG tablet Take 1,000 mcg by mouth daily.     No facility-administered medications prior to visit.    ROS: Review of Systems  Constitutional: Negative for activity change, appetite change, chills, fatigue and unexpected weight change.  HENT: Negative for congestion, mouth sores and sinus pressure.   Eyes: Negative for visual disturbance.  Respiratory: Negative for cough and chest tightness.   Gastrointestinal: Negative for abdominal pain and nausea.  Genitourinary: Negative for difficulty urinating, frequency and vaginal pain.  Musculoskeletal: Negative for back pain and gait problem.  Skin: Negative for pallor and rash.  Neurological: Negative for dizziness, tremors, weakness, numbness and headaches.  Psychiatric/Behavioral: Negative for confusion, sleep disturbance and suicidal ideas.    Objective:  BP 130/72 (BP Location: Left Arm, Patient Position: Sitting, Cuff Size: Large)   Pulse 78   Temp 98.2 F (36.8 C) (Oral)   Ht 5\' 2"  (1.575 m)   Wt 192 lb (87.1 kg)   SpO2 97%   BMI 35.12 kg/m   BP Readings from Last 3 Encounters:  09/21/19 130/72  09/06/19 120/80  05/16/19 138/68    Wt Readings from Last 3  Encounters:  09/21/19 192 lb (87.1 kg)  09/06/19 192 lb (87.1 kg)  05/16/19 193 lb (87.5 kg)    Physical Exam Constitutional:      General: She is not in acute distress.    Appearance: She is well-developed.  HENT:     Head: Normocephalic.     Right Ear: External ear normal.     Left Ear: External ear normal.     Nose: Nose normal.  Eyes:     General:        Right eye: No discharge.        Left eye: No discharge.     Conjunctiva/sclera: Conjunctivae normal.     Pupils: Pupils are equal, round, and reactive to light.  Neck:     Thyroid: No thyromegaly.     Vascular: No JVD.     Trachea: No tracheal deviation.    Cardiovascular:     Rate and Rhythm: Normal rate and regular rhythm.     Heart sounds: Normal heart sounds.  Pulmonary:     Effort: No respiratory distress.     Breath sounds: No stridor. No wheezing.  Abdominal:     General: Bowel sounds are normal. There is no distension.     Palpations: Abdomen is soft. There is no mass.     Tenderness: There is no abdominal tenderness. There is no guarding or rebound.  Musculoskeletal:        General: No tenderness.     Cervical back: Normal range of motion and neck supple.  Lymphadenopathy:     Cervical: No cervical adenopathy.  Skin:    Findings: No erythema or rash.  Neurological:     Cranial Nerves: No cranial nerve deficit.     Motor: No abnormal muscle tone.     Coordination: Coordination normal.     Deep Tendon Reflexes: Reflexes normal.  Psychiatric:        Behavior: Behavior normal.        Thought Content: Thought content normal.        Judgment: Judgment normal.   Obese LS, knees w/pain  Lab Results  Component Value Date   WBC 5.0 05/16/2019   HGB 10.6 (L) 05/16/2019   HCT 32.2 (L) 05/16/2019   PLT 141.0 (L) 05/16/2019   GLUCOSE 151 (H) 05/16/2019   CHOL 122 01/10/2019   TRIG 80.0 01/10/2019   HDL 46.40 01/10/2019   LDLCALC 59 01/10/2019   ALT 12 01/10/2019   AST 12 01/10/2019   NA 136 05/16/2019   K 3.8 05/16/2019   CL 103 05/16/2019   CREATININE 2.51 (H) 05/16/2019   BUN 78 (H) 05/16/2019   CO2 23 05/16/2019   TSH 1.88 01/10/2019   INR 1.16 06/07/2013   HGBA1C 7.3 (A) 09/06/2019   MICROALBUR 13.9 (H) 10/13/2007    DG BONE DENSITY (DXA)  Result Date: 12/19/2018 EXAM: DUAL X-RAY ABSORPTIOMETRY (DXA) FOR BONE MINERAL DENSITY IMPRESSION: Referring Physician:  Salvadore Dom Your patient completed a BMD test using Lunar IDXA DXA system ( analysis version: 16 ) manufactured by [employer]. Technologist: AW PATIENT: Name: [name], [name] Patient ID: [id number] Birth Date: [birth year] Height: 61.0 in. Sex:  Female Measured: 12/19/2018 Weight: 196.2 lbs. Indications: Estrogen Deficient, Postmenopausal, Tobacco User (Current Smoker) Fractures: None Treatments: Calcium (E943.0), Vitamin D (E933.5) ASSESSMENT: The BMD measured at Femur Total Left is 0.893 g/cm2 with a T-score of -0.9. This patient is considered normal according to [facility]) criteria. The scan quality is good.  L-2 and L-3 were excluded due to degenerative changes. Site Region Measured Date Measured Age YA BMD Significant CHANGE T-score DualFemur Total Left 12/19/2018 68.1 -0.9 0.893 g/cm2 AP Spine L1-L4 (L2,L3) 12/19/2018 68.1 0.8 1.266 g/cm2 DualFemur Total Mean 12/19/2018 68.1 -0.8 0.904 g/cm2 World Health Organization [facility]) criteria for post-menopausal, Caucasian Women: Normal       T-score at or above -1 SD Osteopenia   T-score between -1 and -2.5 SD Osteoporosis T-score at or below -2.5 SD RECOMMENDATION: 1. All patients should optimize calcium and vitamin D intake. 2. Consider FDA approved medical therapies in postmenopausal women and men aged 41 years and older, based on the following: a. A hip or vertebral (clinical or morphometric) fracture b. T- score < or = -2.5 at the femoral neck or spine after appropriate evaluation to exclude secondary causes c. Low bone mass (T-score between -1.0 and -2.5 at the femoral neck or spine) and a 10 year probability of a hip fracture > or = 3% or a 10 year probability of a major osteoporosis-related fracture > or = 20% based on the [place]-adapted WHO algorithm d. Clinician judgment and/or patient preferences may indicate treatment for people with 10-year fracture probabilities above or below these levels FOLLOW-UP: Patients with diagnosis of osteoporosis or at high risk for fracture should have regular bone mineral density tests. For patients eligible for Medicare, routine testing is allowed once every 2 years. The testing frequency can be increased to one year for patients who have rapidly  progressing disease, those who are receiving or discontinuing medical therapy to restore bone mass, or have additional risk factors. I have reviewed this report and agree with the above findings. [facility] Radiology Electronically Signed   By: Fidela Salisbury M.D.   On: 12/19/2018 14:20   MM DIGITAL SCREENING BILATERAL  Result Date: 12/20/2018 CLINICAL DATA:  Screening. EXAM: DIGITAL SCREENING BILATERAL MAMMOGRAM WITH CAD COMPARISON:  Previous exam(s). ACR Breast Density Category a: The breast tissue is almost entirely fatty. FINDINGS: There are no findings suspicious for malignancy. Images were processed with CAD. IMPRESSION: No mammographic evidence of malignancy. A result letter of this screening mammogram will be mailed directly to the patient. RECOMMENDATION: Screening mammogram in one year. (Code:SM-B-01Y) BI-RADS CATEGORY  1: Negative. Electronically Signed   By: Margarette Canada M.D.   On: 12/20/2018 11:33    Assessment & Plan:    Follow-up: No follow-ups on file.  Walker Kehr, MD

## 2019-09-21 NOTE — Assessment & Plan Note (Signed)
Better  

## 2019-11-14 ENCOUNTER — Encounter: Payer: Self-pay | Admitting: Physician Assistant

## 2019-11-14 ENCOUNTER — Other Ambulatory Visit: Payer: Self-pay

## 2019-11-14 ENCOUNTER — Ambulatory Visit (INDEPENDENT_AMBULATORY_CARE_PROVIDER_SITE_OTHER): Payer: Medicare Other | Admitting: Physician Assistant

## 2019-11-14 VITALS — BP 132/62 | HR 76 | Ht 62.0 in | Wt 190.4 lb

## 2019-11-14 DIAGNOSIS — I110 Hypertensive heart disease with heart failure: Secondary | ICD-10-CM

## 2019-11-14 DIAGNOSIS — I2510 Atherosclerotic heart disease of native coronary artery without angina pectoris: Secondary | ICD-10-CM | POA: Diagnosis not present

## 2019-11-14 DIAGNOSIS — I5032 Chronic diastolic (congestive) heart failure: Secondary | ICD-10-CM | POA: Diagnosis not present

## 2019-11-14 DIAGNOSIS — E782 Mixed hyperlipidemia: Secondary | ICD-10-CM

## 2019-11-14 DIAGNOSIS — N184 Chronic kidney disease, stage 4 (severe): Secondary | ICD-10-CM

## 2019-11-14 DIAGNOSIS — E1159 Type 2 diabetes mellitus with other circulatory complications: Secondary | ICD-10-CM | POA: Diagnosis not present

## 2019-11-14 NOTE — Progress Notes (Signed)
Cardiology Office Note:    Date:  11/14/2019   ID:  [name], DOB 01-Oct-1950, MRN [id number]  PCP:  [name], MD  Cardiologist:  Mertie Moores, MD  Electrophysiologist:  None   Referring MD: [name], MD   Chief Complaint:  Follow-up (CAD, CHF)    Patient Profile:    [name] is a 69 y.o. female with:   Coronary artery disease   S/p NSTEMI 06/2013 c/b HTN emergency, acute systolic CHF, VDRF  PCI:  DES to mLAD  Ischemic CM  Echocardiogram 12/14: EF 30-35  Echocardiogram 05/2019: EF 60-65, Gr 2 DD  Chronic Diastolic CHF  Diabetes mellitus   Chronic kidney disease 4  Hypertension  Hyperlipidemia    Colon CA   COPD  Prior CV studies: Echocardiogram 05/23/2019 EF 60-65, mod LVH, asymmetric septal hypertrophy, Gr 2 DD, normal RVSF, trivial AI, trivial TR, mild LAE, RVSP 29.6  Cardiac catheterization 06/09/13 LAD mid 95 LCx diff irregs 40-50 RCA irregs Apical HK, EF 55 PCI:  3 x 15 mm Alpine DES to mLAD  History of Present Illness:    [name] was last seen by Dr. Acie Fredrickson in 05/2019.  She returns for follow up.  She is here alone.  Since last seen, she has been doing well.  She is able to get around to do most activities without shortness of breath.  She has not had chest discomfort, syncope, orthopnea.  She has chronic lower extremity swelling.  Her left leg is always greater than her right.  This is overall stable.  Her weights at home have been stable.    Past Medical History:  Diagnosis Date  . Anemia   . COPD (chronic obstructive pulmonary disease) ([place])   . Diabetes mellitus   . Hyperlipidemia   . Hypertension   . Ischemic cardiomyopathy 06/11/2013  . Low back pain   . NSTEMI (non-ST elevated myocardial infarction) ([place]) 06/11/2013  . Obesity   . Personal history of colon cancer   . Renal insufficiency     Current Medications: Current Meds  Medication Sig  . amLODipine (NORVASC) 10 MG tablet TAKE  1/2 TABLET BY MOUTH DAILY  . aspirin 81 MG chewable tablet Chew 1 tablet (81 mg total) by mouth daily.  . betamethasone valerate ointment (VALISONE) 0.1 % Apply 1 application topically 2 (two) times daily.  [name] BYSTOLIC 10 MG tablet TAKE 1 TABLET BY MOUTH EVERY DAY  . calcium carbonate (OS-CAL) 600 MG TABS tablet Take 600 mg by mouth 2 (two) times daily with a meal.  . Cholecalciferol (VITAMIN D3) 50 MCG (2000 UT) capsule Take 1 capsule (2,000 Units total) by mouth daily.  . fluticasone (FLONASE) 50 MCG/ACT nasal spray Place 2 sprays into both nostrils daily as needed for allergies or rhinitis.  [name] glipiZIDE (GLUCOTROL XL) 5 MG 24 hr tablet Take 1 tablet (5 mg total) by mouth daily with breakfast.  . glucose blood (ONETOUCH ULTRA) test strip USE TO TEST BLOOD SUGAR TWO TIMES A DAY AS DIRECTED  . hydrALAZINE (APRESOLINE) 25 MG tablet TAKE 1 TABLET BY MOUTH THREE TIMES A DAY  . hydrOXYzine (ATARAX/VISTARIL) 25 MG tablet Take 1-2 tablets (25-50 mg total) by mouth every 8 (eight) hours as needed for itching.  . lansoprazole (PREVACID) 15 MG capsule Take 1 capsule (15 mg total) by mouth daily at 12 noon.  . nitroGLYCERIN (NITROSTAT) 0.4 MG SL tablet DISSOLVE 1 TABLET UNDER THE TONGUE EVERY 5 MINUTES AS  NEEDED FOR  CHEST PAIN  . ondansetron (ZOFRAN) 4 MG tablet Take 1 tablet (4 mg total) by mouth every 8 (eight) hours as needed for nausea or vomiting.  . potassium chloride SA (KLOR-CON) 20 MEQ tablet Take 1 tablet (20 mEq total) by mouth daily.  . rosuvastatin (CRESTOR) 5 MG tablet TAKE 1 TABLET BY MOUTH  DAILY AT 6PM  . torsemide (DEMADEX) 100 MG tablet TAKE 0.5-1 TABLETS (50-100 MG TOTAL) BY MOUTH DAILY.  . vitamin B-12 (CYANOCOBALAMIN) 1000 MCG tablet Take 1,000 mcg by mouth daily.     Allergies:   Tradjenta [linagliptin], Atorvastatin, Enalapril maleate, Farxiga [dapagliflozin], Hydrochlorothiazide, Kenalog [triamcinolone acetonide], Metformin and related, Propoxyphene n-acetaminophen, Simvastatin,  and Spironolactone   Social History   Tobacco Use  . Smoking status: Light Tobacco Smoker    Types: Cigarettes  . Smokeless tobacco: Never Used  Substance Use Topics  . Alcohol use: No  . Drug use: No     Family Hx: The patient's family history includes Hypertension in an other family member.  Review of Systems  Constitution: Positive for weight loss.  Musculoskeletal: Positive for joint pain.     EKGs/Labs/Other Test Reviewed:    EKG:  EKG is  ordered today.  The ekg ordered today demonstrates normal sinus rhythm, heart rate 76, normal axis, J-point elevation, QTC 423, no change since prior tracing  Recent Labs: 01/10/2019: ALT 12; TSH 1.88 05/16/2019: BUN 78; Creatinine, Ser 2.51; Hemoglobin 10.6; Platelets 141.0; Potassium 3.8; Sodium 136   Recent Lipid Panel Lab Results  Component Value Date/Time   CHOL 122 01/10/2019 10:18 AM   CHOL 129 03/29/2018 10:42 AM   TRIG 80.0 01/10/2019 10:18 AM   HDL 46.40 01/10/2019 10:18 AM   HDL 46 03/29/2018 10:42 AM   CHOLHDL 3 01/10/2019 10:18 AM   LDLCALC 59 01/10/2019 10:18 AM   LDLCALC 69 03/29/2018 10:42 AM    Physical Exam:    VS:  BP 132/62   Pulse 76   Ht 5\' 2"  (1.575 m)   Wt 190 lb 6.4 oz (86.4 kg)   SpO2 99%   BMI 34.82 kg/m     Wt Readings from Last 3 Encounters:  11/14/19 190 lb 6.4 oz (86.4 kg)  09/21/19 192 lb (87.1 kg)  09/06/19 192 lb (87.1 kg)     Constitutional:      Appearance: Healthy appearance. Not in distress.  Neck:     Thyroid: No thyromegaly.     Vascular: JVD normal.  Pulmonary:     Breath sounds: No wheezing. No rales.  Cardiovascular:     Regular rhythm. Normal S1. Normal S2.     Murmurs: There is a grade 2/6 systolic murmur at the URSB and ULSB.  Edema:    Pretibial: trace edema of the left pretibial area and 1+ edema of the right pretibial area.    Ankle: trace edema of the left ankle and 1+ edema of the right ankle. Abdominal:     Palpations: Abdomen is soft. There is no  hepatomegaly.  Musculoskeletal: Normal range of motion. Skin:    General: Skin is warm and dry.  Neurological:     General: No focal deficit present.     Mental Status: Alert and oriented to person, place and time.     Cranial Nerves: Cranial nerves are intact.      ASSESSMENT & PLAN:    1. Coronary artery disease involving native coronary artery of native heart without angina pectoris History of non-STEMI in December 2014 in the  setting of hypertensive emergency complicated by systolic CHF and ventilator dependent respiratory failure.  She underwent stenting with a DES to the LAD.  EF was 30-35% but improved to normal.  She is currently doing well without anginal symptoms.  Continue aspirin, beta-blocker, statin.  Follow-up 68-months.  2. Chronic diastolic CHF (congestive heart failure) (HCC) Echocardiogram November 2020 with moderate diastolic dysfunction and normal LV function.  Her diastolic heart failure is complicated by chronic kidney disease.  Currently, her volume status appears stable on her current diuretic regimen.  Continue current therapy.  3. Hypertensive heart disease with chronic diastolic congestive heart failure ([place]) She notes fairly optimal blood pressure readings at home.  Overall, blood pressure seems to be well controlled.  Continue current dose of amlodipine, hydralazine, nebivolol.  4. Mixed hyperlipidemia LDL optimal on most recent lab work.  Continue current Rx.    5. Controlled type 2 diabetes mellitus with other circulatory complication, without long-term current use of insulin ([place]) She is managed by endocrinology.  Recent hemoglobin A1c 7.3.  6. CKD (chronic kidney disease) stage 4, [name] 15-29 ml/min (HCC) She is followed by nephrology (Dr. Hollie Salk).  Recent creatinine stable.    Dispo:  Return for Routine Follow Up, w/ Dr. Acie Fredrickson, in person.   Medication Adjustments/Labs and Tests Ordered: Current medicines are reviewed at length with the patient today.   Concerns regarding medicines are outlined above.  Tests Ordered: Orders Placed This Encounter  Procedures  . EKG 12-Lead   Medication Changes: No orders of the defined types were placed in this encounter.   Signed, Richardson Dopp, PA-C  11/14/2019 5:17 PM    [facility] [place], [place],   [address] Phone: [phone]; Fax: [phone]

## 2019-11-14 NOTE — Patient Instructions (Signed)
Medication Instructions:  No changes.  See your medication list.  *If you need a refill on your cardiac medications before your next appointment, please call your pharmacy*   Follow-Up: At [facility], you and your health needs are our priority.  As part of our continuing mission to provide you with exceptional heart care, we have created designated Provider Care Teams.  These Care Teams include your primary Cardiologist (physician) and Advanced Practice Providers (APPs -  Physician Assistants and Nurse Practitioners) who all work together to provide you with the care you need, when you need it.  We recommend signing up for the patient portal called "MyChart".  Sign up information is provided on this After Visit Summary.  MyChart is used to connect with patients for Virtual Visits (Telemedicine).  Patients are able to view lab/test results, encounter notes, upcoming appointments, etc.  Non-urgent messages can be sent to your provider as well.   To learn more about what you can do with MyChart, go to [email or web address].    Your next appointment:   6 month(s)  The format for your next appointment:   In Person  Provider:   Mertie Moores, MD or Richardson Dopp, PA-C

## 2019-12-17 ENCOUNTER — Other Ambulatory Visit: Payer: Self-pay | Admitting: Internal Medicine

## 2019-12-18 ENCOUNTER — Other Ambulatory Visit: Payer: Self-pay | Admitting: Cardiovascular Disease

## 2019-12-18 ENCOUNTER — Other Ambulatory Visit: Payer: Self-pay | Admitting: Internal Medicine

## 2019-12-19 ENCOUNTER — Other Ambulatory Visit: Payer: Self-pay | Admitting: Internal Medicine

## 2020-01-01 ENCOUNTER — Other Ambulatory Visit: Payer: Self-pay | Admitting: Obstetrics and Gynecology

## 2020-01-01 ENCOUNTER — Other Ambulatory Visit: Payer: Self-pay | Admitting: Internal Medicine

## 2020-01-01 DIAGNOSIS — Z1231 Encounter for screening mammogram for malignant neoplasm of breast: Secondary | ICD-10-CM

## 2020-01-10 ENCOUNTER — Encounter: Payer: Self-pay | Admitting: Internal Medicine

## 2020-01-10 ENCOUNTER — Ambulatory Visit (INDEPENDENT_AMBULATORY_CARE_PROVIDER_SITE_OTHER): Payer: Medicare Other | Admitting: Internal Medicine

## 2020-01-10 ENCOUNTER — Ambulatory Visit
Admission: RE | Admit: 2020-01-10 | Discharge: 2020-01-10 | Disposition: A | Discharge status: Home / Self Care | Payer: Medicare Other | Source: Ambulatory Visit | Attending: Obstetrics and Gynecology | Admitting: Obstetrics and Gynecology

## 2020-01-10 ENCOUNTER — Other Ambulatory Visit: Payer: Self-pay

## 2020-01-10 VITALS — BP 138/70 | HR 78 | Ht 62.0 in | Wt 188.0 lb

## 2020-01-10 DIAGNOSIS — E1159 Type 2 diabetes mellitus with other circulatory complications: Secondary | ICD-10-CM

## 2020-01-10 DIAGNOSIS — E049 Nontoxic goiter, unspecified: Secondary | ICD-10-CM

## 2020-01-10 DIAGNOSIS — Z1231 Encounter for screening mammogram for malignant neoplasm of breast: Secondary | ICD-10-CM

## 2020-01-10 DIAGNOSIS — H5213 Myopia, bilateral: Secondary | ICD-10-CM | POA: Diagnosis not present

## 2020-01-10 DIAGNOSIS — E785 Hyperlipidemia, unspecified: Secondary | ICD-10-CM

## 2020-01-10 DIAGNOSIS — Z6837 Body mass index (BMI) 37.0-37.9, adult: Secondary | ICD-10-CM

## 2020-01-10 LAB — POCT GLYCOSYLATED HEMOGLOBIN (HGB A1C): Hemoglobin A1C: 7.2 % — AB (ref 4.0–5.6)

## 2020-01-10 LAB — LIPID PANEL
Cholesterol: 119 mg/dL (ref 0–200)
HDL: 48.5 mg/dL (ref >39.00)
LDL Cholesterol: 56 mg/dL (ref 0–99)
NonHDL: 70.5
Total CHOL/HDL Ratio: 2
Triglycerides: 73 mg/dL (ref 0.0–149.0)
VLDL: 14.6 mg/dL (ref 0.0–40.0)

## 2020-01-10 LAB — TSH: TSH: 2.17 u[IU]/mL (ref 0.35–4.50)

## 2020-01-10 NOTE — Patient Instructions (Signed)
Please continue: - Glipizide ER 5 mg before breakfast  KEEP UP THE GREAT WORK!  Please return in 4 months with your sugar log.

## 2020-01-10 NOTE — Progress Notes (Signed)
Patient ID: [name], female   DOB: 1951-01-06, 69 y.o.   MRN: [id number]  This visit occurred during the SARS-CoV-2 public health emergency.  Safety protocols were in place, including screening questions prior to the visit, additional usage of staff PPE, and extensive cleaning of exam room while observing appropriate contact time as indicated for disinfecting solutions.   HPI: [name] is a 69 y.o.-year-old female, returning for f/u DM2, dx in 2006, insulin-independent, uncontrolled, with complications (CAD - s/p AMI 06/2013, CHF; CKD; PN; DR). Last visit 4 months ago.  Before last visit, she eliminated sweet drinks, ice cream, cakes and sugars improved significantly.  She continues to do a great job switching to a more plant-based diet.  Reviewed HbA1c levels: Lab Results  Component Value Date   HGBA1C 7.3 (A) 09/06/2019   HGBA1C 8.4 (H) 05/16/2019   HGBA1C 8.0 (A) 05/02/2019  05/02/2019: HbA1c calculated from fructosamine was 7.0% 12/30/2018: HbA1c calculated from fructosamine is higher, at 8.2%, possibly 2/2 drinking juice 08/24/2018: HbA1c calculated from fructosamine is higher, at 8%, possibly 2/2 ABx  04/20/2018: HbA1c calculated from fructosamine is slightly better than before, at 7% 12/15/2017: HbA1c calculated from fructosamine: 7.17%, slightly improved from last visit  08/17/2017: HbA1c calculated from fructosamine: 7.2%, slightly improved 04/15/2017: HbA1c calculated from the fructosamine is lower than measured, at 7.3% 08/21/2016: HbA1c 6.0%. She started to change her diet after her hemoglobin A1c returned high, at 16.2% in 09/2014. She cut down portions, changed the meal content to include more fiber and less carbs.  HbA1c decreased dramatically.  She refused insulin in the past.  Pt is on: - Glipizide ER 5 mg in a.m.-started 2016 She stopped metformin ER in 01/2017. She tried Tradjenta 5 mg daily in am >> CP with it (started 10/2014) >> had to stop She tried  [place] >> decreased [name] and yeast inf. She tried regular metformin >> diarrhea  Pt checks her sugars twice a day per review of her log: - am: 105-140, 148 (on Prednisone: 233-269) >> 95-128 >> 97-129, 155 (spaghetti) - 2h after b'fast: 99-130, 140 >> 108-147 >> 108-130 >> 122-139 - before lunch: 132-159, 168 (snack) >> 143 >> 112 >> 122, 138 - 2h after lunch: 111, 135-166 >> 133-182 >> 169 >> 120-130 >> n/c - before dinner:  68, 132-171 >> 118-146 >> 110-144 >> 118-130 - 2h after dinner:  100-157, 177(prednisone: 300) >> 120-148 >> 122-141, 170 - bedtime:   n/c >> 137 >> n/c >> 147-157 >> n/c - nighttime: n/c Lowest sugar was 60 >> 89 >> 68 >> ; it is unclear at which level she has hypoglycemia awareness. Highest sugar was  182 >> 300 (Prednisone) >> 169 >> 156.  Glucometer: One Touch Ultra  Pt's meals are: - Breakfast: oatmeal, cereals, [place] bacon, egg toast; cinnamon on oatmeal or cheerios - Lunch: sandwich or fruit salad - Dinner: salmon/chicken, Brussel sprouts, other veggies, sweet potatoes or brown rice, spaghetti - Snacks: 2: carrots with dip; yoghurt with fruit, veggies + dip; fruit She walks for exercise. She saw Dr. Hollie Salk with nephrology.  She was started on a high potassium, low protein diet  -+ CKD-sees nephrology.  Latest BUN/creatinine: Lab Results  Component Value Date   BUN 78 (H) 05/16/2019   CREATININE 2.51 (H) 05/16/2019  She stopped olmesartan due to worsening kidney function and cough. -+ HL; last set of lipids: Lab Results  Component Value Date   CHOL 122 01/10/2019   HDL 46.40 01/10/2019  LDLCALC 59 01/10/2019   TRIG 80.0 01/10/2019   CHOLHDL 3 01/10/2019  On Crestor 5 every other day. - last eye exam was in 04/2019: + DR, + cataract.  Had laser surgery. Dr. Posey Pronto. -+ Numbness and tingling in her feet.  She also has a history of HTN, GERD, and anemia.  She has a history of goiter but latest thyroid ultrasound from 2018 showed only small cysts  throughout.  Pt denies: - feeling nodules in neck - hoarseness - dysphagia - choking - SOB with lying down  Latest TSH was normal: Lab Results  Component Value Date   TSH 1.88 01/10/2019   ROS: Constitutional: no weight gain/+ weight loss, no fatigue, no subjective hyperthermia, no subjective hypothermia Eyes: no blurry vision, no xerophthalmia ENT: no sore throat, + see HPI Cardiovascular: no CP/no SOB/no palpitations/no leg swelling Respiratory: no cough/no SOB/no wheezing Gastrointestinal: no N/no V/no D/no C/no acid reflux Musculoskeletal: no muscle aches/no joint aches Skin: no rashes, no hair loss Neurological: no tremors/+ numbness/+ tingling/no dizziness  I reviewed pt's medications, allergies, PMH, social hx, family hx, and changes were documented in the history of present illness. Otherwise, unchanged from my initial visit note.  Past Medical History:  Diagnosis Date  . Anemia   . COPD (chronic obstructive pulmonary disease) ([place])   . Diabetes mellitus   . Hyperlipidemia   . Hypertension   . Ischemic cardiomyopathy 06/11/2013  . Low back pain   . NSTEMI (non-ST elevated myocardial infarction) ([place]) 06/11/2013  . Obesity   . Personal history of colon cancer   . Renal insufficiency    Past Surgical History:  Procedure Laterality Date  . COLECTOMY    . LEFT HEART CATHETERIZATION WITH CORONARY ANGIOGRAM N/A 06/09/2013   Procedure: LEFT HEART CATHETERIZATION WITH CORONARY ANGIOGRAM;  Surgeon: Ramond Dial, MD;  Location: [facility] C[facility] LAB;  Service: Cardiovascular;  Laterality: N/A;   History   Social History  . Marital Status: Single    Spouse Name: N/A  . Number of Children: 0   Occupational History  . retired   Social History Main Topics  . Smoking status:  former smoker     Types: Cigarettes  . Smokeless tobacco: Never Used  . Alcohol Use: No  . Drug Use: No   Current Outpatient Medications on File Prior to Visit  Medication Sig Dispense Refill   . amLODipine (NORVASC) 10 MG tablet TAKE ONE-HALF TABLET BY  MOUTH DAILY 45 tablet 3  . aspirin 81 MG chewable tablet Chew 1 tablet (81 mg total) by mouth daily.    . betamethasone valerate ointment (VALISONE) 0.1 % Apply 1 application topically 2 (two) times daily. 15 g 0  . BYSTOLIC 10 MG tablet TAKE 1 TABLET BY MOUTH EVERY DAY 90 tablet 1  . calcium carbonate (OS-CAL) 600 MG TABS tablet Take 600 mg by mouth 2 (two) times daily with a meal.    . Cholecalciferol (VITAMIN D3) 50 MCG (2000 UT) capsule Take 1 capsule (2,000 Units total) by mouth daily. 100 capsule 3  . fluticasone (FLONASE) 50 MCG/ACT nasal spray Place 2 sprays into both nostrils daily as needed for allergies or rhinitis.    [name] glipiZIDE (GLUCOTROL XL) 5 MG 24 hr tablet TAKE 1 TABLET BY MOUTH  DAILY WITH BREAKFAST 90 tablet 3  . glucose blood (ONETOUCH ULTRA) test strip USE TO TEST BLOOD SUGAR TWO TIMES A DAY AS DIRECTED 200 each 3  . hydrALAZINE (APRESOLINE) 25 MG tablet TAKE 1 TABLET  BY MOUTH THREE TIMES A DAY 270 tablet 3  . hydrOXYzine (ATARAX/VISTARIL) 25 MG tablet Take 1-2 tablets (25-50 mg total) by mouth every 8 (eight) hours as needed for itching. 60 tablet 0  . lansoprazole (PREVACID) 15 MG capsule Take 1 capsule (15 mg total) by mouth daily at 12 noon. 90 capsule 3  . nitroGLYCERIN (NITROSTAT) 0.4 MG SL tablet DISSOLVE 1 TABLET UNDER THE TONGUE EVERY 5 MINUTES AS  NEEDED FOR CHEST PAIN 20 tablet 3  . ondansetron (ZOFRAN) 4 MG tablet Take 1 tablet (4 mg total) by mouth every 8 (eight) hours as needed for nausea or vomiting. 20 tablet 0  . potassium chloride SA (KLOR-CON) 20 MEQ tablet TAKE 1 TABLET BY MOUTH  DAILY 90 tablet 3  . rosuvastatin (CRESTOR) 5 MG tablet TAKE 1 TABLET BY MOUTH  DAILY AT 6PM 90 tablet 3  . torsemide (DEMADEX) 100 MG tablet TAKE 0.5-1 TABLETS (50-100 MG TOTAL) BY MOUTH DAILY. 90 tablet 1  . vitamin B-12 (CYANOCOBALAMIN) 1000 MCG tablet Take 1,000 mcg by mouth daily.     No current  facility-administered medications on file prior to visit.   Allergies  Allergen Reactions  . Tradjenta [Linagliptin] Anaphylaxis    CP  . Atorvastatin     REACTION: aches and pains  . Enalapril Maleate     REACTION: cough  . Farxiga [Dapagliflozin] Itching  . Hydrochlorothiazide     REACTION: hair loss  . Kenalog [Triamcinolone Acetonide]     HANDS NUMB   . Metformin And Related     Diarrhea, dizziness  . Propoxyphene N-Acetaminophen Hives  . Simvastatin     REACTION: cramps  . Spironolactone     REACTION: cramps   Family History  Problem Relation Age of Onset  . Hypertension Other    PE: BP 138/70   Pulse 78   Ht 5\' 2"  (1.575 m)   Wt 188 lb (85.3 kg)   SpO2 98%   BMI 34.39 kg/m  Body mass index is 34.39 kg/m. Wt Readings from Last 3 Encounters:  01/10/20 188 lb (85.3 kg)  11/14/19 190 lb 6.4 oz (86.4 kg)  09/21/19 192 lb (87.1 kg)   Constitutional: overweight, in NAD Eyes: PERRLA, EOMI, no exophthalmos ENT: moist mucous membranes, no thyromegaly, no cervical lymphadenopathy Cardiovascular: RRR, No RG, +1/6 SEM, + L>R LE edema Respiratory: CTA B Gastrointestinal: abdomen soft, NT, ND, BS+ Musculoskeletal: no deformities, strength intact in all 4 Skin: moist, warm, no rashes Neurological: no tremor with outstretched hands, DTR normal in all 4  ASSESSMENT: 1. DM2, insulin-independent, uncontrolled, with complications - CAD, s/p AMI 06/2013 - Dr Acie Fredrickson - CHF - CKD - PN - DR - Dr. Jalene Mullet ([facility]) - on IO injections >> improving  2. Goiter - No neck compression symptoms  - 10/19/2014: thyroid ultrasound: Right thyroid lobe: 4.0 x 1.5 x 1.6 cm. Heterogeneous parenchyma with multiple small cysts identified. The largest measures approximately 0.5 x 0.3 x 0.5 cm. All of the small cysts in the right lobe appears simple and benign.  Left thyroid lobe: 4.5 x 1.4 x 2.0 cm. Heterogeneous parenchyma with multiple cysts. The largest measures  approximately 1.0 x 0.4 x 0.5 cm. 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 Similar smaller cyst measures 0.8 cm in greatest diameter. There is a small solid nodule measuring 0.7 x 0.4 x 0.6 cm.  Isthmus Thickness: 0.4 cm. No nodules visualized.  Lymphadenopathy: None visualized.  Multicystic thyroid.  3. HL  PLAN:  1. Patient with longstanding, uncontrolled DM2, only on Glipizide ER as she has CKD and she refused insulin.  Before last visit, she improve her diet significantly, but immediately sweets and processed foods and her sugars improved.  HbA1c was 7.3%.  We did not change her regimen at that time, continuing the glipizide ER low dose in the morning.  Of note, she did refuse insulin in the past. -At this visit, her sugars are excellent and quite stable throughout the day.  She is still doing a great job with her diet and I strongly advised her to continue.  I do not feel we need to change her regimen. -I advised her to: Patient Instructions  Please continue: - Glipizide ER 5 mg before breakfast  KEEP UP THE GREAT WORK!  Please return in 4 months with your sugar log.  - we checked her HbA1c: 7.2% (slightly better) -we will not check a fructosamine level today - advised to check sugars at different times of the day - 1x a day, rotating check times - advised for yearly eye exams >> she is UTD - return to clinic in 4 months    2. Goiter -No neck compression symptoms -She only has small thyroid cysts per review of the latest thyroid ultrasound from 10/2016 -TSH was reviewed and this was normal 1 year ago >> we will repeat this today -We discussed that no follow-up is necessary unless she develops neck compression symptoms  3. HL -Reviewed latest lipid panel from 01/2019: All fractions at goal: Lab Results  Component Value Date   CHOL 122 01/10/2019   HDL 46.40 01/10/2019   LDLCALC 59 01/10/2019   TRIG 80.0 01/10/2019    CHOLHDL 3 01/10/2019  -She is on low-dose Crestor: 5 mg every other day, without side effects -We will recheck her lipids today.  She only had a small bowl of strawberries this morning. Component     Latest Ref Rng & Units 01/10/2020  Cholesterol     0 - 200 mg/dL 119  Triglycerides     0 - 149 mg/dL 73.0  HDL Cholesterol     >39.00 mg/dL 48.50  VLDL     0.0 - 40.0 mg/dL 14.6  LDL (calc)     0 - 99 mg/dL 56  Total CHOL/HDL Ratio      2  NonHDL      70.50  TSH     0.35 - 4.50 uIU/mL 2.17   Excellent labs!  Philemon Kingdom, MD PhD [facility] Endocrinology

## 2020-01-26 DIAGNOSIS — H25813 Combined forms of age-related cataract, bilateral: Secondary | ICD-10-CM | POA: Diagnosis not present

## 2020-01-26 DIAGNOSIS — H31093 Other chorioretinal scars, bilateral: Secondary | ICD-10-CM | POA: Diagnosis not present

## 2020-01-26 DIAGNOSIS — E113512 Type 2 diabetes mellitus with proliferative diabetic retinopathy with macular edema, left eye: Secondary | ICD-10-CM | POA: Diagnosis not present

## 2020-01-26 DIAGNOSIS — E113511 Type 2 diabetes mellitus with proliferative diabetic retinopathy with macular edema, right eye: Secondary | ICD-10-CM | POA: Diagnosis not present

## 2020-01-26 DIAGNOSIS — H3582 Retinal ischemia: Secondary | ICD-10-CM | POA: Diagnosis not present

## 2020-02-07 DIAGNOSIS — N184 Chronic kidney disease, stage 4 (severe): Secondary | ICD-10-CM | POA: Diagnosis not present

## 2020-02-07 DIAGNOSIS — N2581 Secondary hyperparathyroidism of renal origin: Secondary | ICD-10-CM | POA: Diagnosis not present

## 2020-02-07 DIAGNOSIS — I129 Hypertensive chronic kidney disease with stage 1 through stage 4 chronic kidney disease, or unspecified chronic kidney disease: Secondary | ICD-10-CM | POA: Diagnosis not present

## 2020-02-07 DIAGNOSIS — D631 Anemia in chronic kidney disease: Secondary | ICD-10-CM | POA: Diagnosis not present

## 2020-02-07 DIAGNOSIS — N189 Chronic kidney disease, unspecified: Secondary | ICD-10-CM | POA: Diagnosis not present

## 2020-02-07 DIAGNOSIS — E1122 Type 2 diabetes mellitus with diabetic chronic kidney disease: Secondary | ICD-10-CM | POA: Diagnosis not present

## 2020-02-14 DIAGNOSIS — H35033 Hypertensive retinopathy, bilateral: Secondary | ICD-10-CM | POA: Diagnosis not present

## 2020-02-14 DIAGNOSIS — H31093 Other chorioretinal scars, bilateral: Secondary | ICD-10-CM | POA: Diagnosis not present

## 2020-02-14 DIAGNOSIS — E113513 Type 2 diabetes mellitus with proliferative diabetic retinopathy with macular edema, bilateral: Secondary | ICD-10-CM | POA: Diagnosis not present

## 2020-02-14 DIAGNOSIS — H3582 Retinal ischemia: Secondary | ICD-10-CM | POA: Diagnosis not present

## 2020-03-05 ENCOUNTER — Inpatient Hospital Stay (HOSPITAL_COMMUNITY)
Admission: EM | Admit: 2020-03-05 | Discharge: 2020-03-14 | DRG: 853 | Disposition: A | Discharge status: Skilled Nursing Facility | Payer: Medicare Other | Attending: Internal Medicine | Admitting: Internal Medicine

## 2020-03-05 ENCOUNTER — Encounter (HOSPITAL_COMMUNITY): Payer: Self-pay

## 2020-03-05 DIAGNOSIS — A419 Sepsis, unspecified organism: Secondary | ICD-10-CM | POA: Diagnosis not present

## 2020-03-05 DIAGNOSIS — I130 Hypertensive heart and chronic kidney disease with heart failure and stage 1 through stage 4 chronic kidney disease, or unspecified chronic kidney disease: Secondary | ICD-10-CM | POA: Diagnosis not present

## 2020-03-05 DIAGNOSIS — I255 Ischemic cardiomyopathy: Secondary | ICD-10-CM | POA: Diagnosis present

## 2020-03-05 DIAGNOSIS — I2510 Atherosclerotic heart disease of native coronary artery without angina pectoris: Secondary | ICD-10-CM | POA: Diagnosis not present

## 2020-03-05 DIAGNOSIS — E1165 Type 2 diabetes mellitus with hyperglycemia: Secondary | ICD-10-CM | POA: Diagnosis present

## 2020-03-05 DIAGNOSIS — L03818 Cellulitis of other sites: Secondary | ICD-10-CM | POA: Diagnosis not present

## 2020-03-05 DIAGNOSIS — A4181 Sepsis due to Enterococcus: Principal | ICD-10-CM | POA: Diagnosis present

## 2020-03-05 DIAGNOSIS — Z743 Need for continuous supervision: Secondary | ICD-10-CM | POA: Diagnosis not present

## 2020-03-05 DIAGNOSIS — D631 Anemia in chronic kidney disease: Secondary | ICD-10-CM | POA: Diagnosis present

## 2020-03-05 DIAGNOSIS — R739 Hyperglycemia, unspecified: Secondary | ICD-10-CM

## 2020-03-05 DIAGNOSIS — W19XXXA Unspecified fall, initial encounter: Secondary | ICD-10-CM | POA: Diagnosis not present

## 2020-03-05 DIAGNOSIS — Z7982 Long term (current) use of aspirin: Secondary | ICD-10-CM | POA: Diagnosis not present

## 2020-03-05 DIAGNOSIS — E785 Hyperlipidemia, unspecified: Secondary | ICD-10-CM | POA: Diagnosis not present

## 2020-03-05 DIAGNOSIS — I10 Essential (primary) hypertension: Secondary | ICD-10-CM | POA: Diagnosis not present

## 2020-03-05 DIAGNOSIS — E1159 Type 2 diabetes mellitus with other circulatory complications: Secondary | ICD-10-CM | POA: Diagnosis not present

## 2020-03-05 DIAGNOSIS — L02214 Cutaneous abscess of groin: Secondary | ICD-10-CM | POA: Diagnosis not present

## 2020-03-05 DIAGNOSIS — I252 Old myocardial infarction: Secondary | ICD-10-CM

## 2020-03-05 DIAGNOSIS — L0390 Cellulitis, unspecified: Secondary | ICD-10-CM

## 2020-03-05 DIAGNOSIS — R531 Weakness: Secondary | ICD-10-CM | POA: Diagnosis not present

## 2020-03-05 DIAGNOSIS — J449 Chronic obstructive pulmonary disease, unspecified: Secondary | ICD-10-CM | POA: Diagnosis present

## 2020-03-05 DIAGNOSIS — E1110 Type 2 diabetes mellitus with ketoacidosis without coma: Secondary | ICD-10-CM | POA: Diagnosis not present

## 2020-03-05 DIAGNOSIS — Z9114 Patient's other noncompliance with medication regimen: Secondary | ICD-10-CM

## 2020-03-05 DIAGNOSIS — L03311 Cellulitis of abdominal wall: Secondary | ICD-10-CM | POA: Diagnosis not present

## 2020-03-05 DIAGNOSIS — E1122 Type 2 diabetes mellitus with diabetic chronic kidney disease: Secondary | ICD-10-CM | POA: Diagnosis present

## 2020-03-05 DIAGNOSIS — N184 Chronic kidney disease, stage 4 (severe): Secondary | ICD-10-CM | POA: Diagnosis not present

## 2020-03-05 DIAGNOSIS — Z7984 Long term (current) use of oral hypoglycemic drugs: Secondary | ICD-10-CM | POA: Diagnosis not present

## 2020-03-05 DIAGNOSIS — Z79899 Other long term (current) drug therapy: Secondary | ICD-10-CM | POA: Diagnosis not present

## 2020-03-05 DIAGNOSIS — L02215 Cutaneous abscess of perineum: Secondary | ICD-10-CM

## 2020-03-05 DIAGNOSIS — F17210 Nicotine dependence, cigarettes, uncomplicated: Secondary | ICD-10-CM | POA: Diagnosis present

## 2020-03-05 DIAGNOSIS — I5042 Chronic combined systolic (congestive) and diastolic (congestive) heart failure: Secondary | ICD-10-CM | POA: Diagnosis not present

## 2020-03-05 DIAGNOSIS — N390 Urinary tract infection, site not specified: Secondary | ICD-10-CM | POA: Diagnosis not present

## 2020-03-05 DIAGNOSIS — Z6834 Body mass index (BMI) 34.0-34.9, adult: Secondary | ICD-10-CM

## 2020-03-05 DIAGNOSIS — R6889 Other general symptoms and signs: Secondary | ICD-10-CM | POA: Diagnosis not present

## 2020-03-05 DIAGNOSIS — L03314 Cellulitis of groin: Secondary | ICD-10-CM | POA: Diagnosis not present

## 2020-03-05 DIAGNOSIS — Z85038 Personal history of other malignant neoplasm of large intestine: Secondary | ICD-10-CM | POA: Diagnosis not present

## 2020-03-05 DIAGNOSIS — Z20822 Contact with and (suspected) exposure to covid-19: Secondary | ICD-10-CM | POA: Diagnosis present

## 2020-03-05 LAB — URINALYSIS, ROUTINE W REFLEX MICROSCOPIC
Bilirubin Urine: NEGATIVE
Glucose, UA: >=500 mg/dL — AB
Ketones, ur: NEGATIVE mg/dL
Nitrite: NEGATIVE
Protein, ur: NEGATIVE mg/dL
Specific Gravity, Urine: 1.01 (ref 1.005–1.030)
pH: 5 (ref 5.0–8.0)

## 2020-03-05 LAB — BASIC METABOLIC PANEL
Anion gap: 15 (ref 5–15)
BUN: 119 mg/dL — ABNORMAL HIGH (ref 8–23)
CO2: 16 mmol/L — ABNORMAL LOW (ref 22–32)
Calcium: 9.2 mg/dL (ref 8.9–10.3)
Chloride: 98 mmol/L (ref 98–111)
Creatinine, Ser: 2.78 mg/dL — ABNORMAL HIGH (ref 0.44–1.00)
GFR calc Af Amer: 19 mL/min — ABNORMAL LOW (ref >60)
GFR calc non Af Amer: 17 mL/min — ABNORMAL LOW (ref >60)
Glucose, Bld: 674 mg/dL (ref 70–99)
Potassium: 4.6 mmol/L (ref 3.5–5.1)
Sodium: 129 mmol/L — ABNORMAL LOW (ref 135–145)

## 2020-03-05 LAB — CBC
HCT: 28.5 % — ABNORMAL LOW (ref 36.0–46.0)
Hemoglobin: 8.6 g/dL — ABNORMAL LOW (ref 12.0–15.0)
MCH: 28.1 pg (ref 26.0–34.0)
MCHC: 30.2 g/dL (ref 30.0–36.0)
MCV: 93.1 fL (ref 80.0–100.0)
Platelets: 193 10*3/uL (ref 150–400)
RBC: 3.06 MIL/uL — ABNORMAL LOW (ref 3.87–5.11)
RDW: 15.8 % — ABNORMAL HIGH (ref 11.5–15.5)
WBC: 31.7 10*3/uL — ABNORMAL HIGH (ref 4.0–10.5)
nRBC: 0.1 % (ref 0.0–0.2)

## 2020-03-05 LAB — CBG MONITORING, ED: Glucose-Capillary: 547 mg/dL (ref 70–99)

## 2020-03-05 LAB — CK: Total CK: 104 U/L (ref 38–234)

## 2020-03-05 NOTE — ED Triage Notes (Signed)
Pt from home with ems for fall, she was laying on the floor all night from midnight to 1pm today, neighbor found her. Pt denies any dizziness or feeling lightheaded, states she was in so much pain in her groin that she fell. Pt reports multiple abscesses in her groin area. Pt has not taken any meds in a week. CBG reading HIGH. Pt a.o, no injuries from fall.

## 2020-03-06 ENCOUNTER — Inpatient Hospital Stay (HOSPITAL_COMMUNITY): Payer: Medicare Other

## 2020-03-06 ENCOUNTER — Other Ambulatory Visit: Payer: Self-pay

## 2020-03-06 DIAGNOSIS — L0390 Cellulitis, unspecified: Secondary | ICD-10-CM | POA: Diagnosis present

## 2020-03-06 DIAGNOSIS — R6889 Other general symptoms and signs: Secondary | ICD-10-CM | POA: Diagnosis not present

## 2020-03-06 DIAGNOSIS — I255 Ischemic cardiomyopathy: Secondary | ICD-10-CM | POA: Diagnosis present

## 2020-03-06 DIAGNOSIS — L03818 Cellulitis of other sites: Secondary | ICD-10-CM

## 2020-03-06 DIAGNOSIS — M6281 Muscle weakness (generalized): Secondary | ICD-10-CM | POA: Diagnosis not present

## 2020-03-06 DIAGNOSIS — E785 Hyperlipidemia, unspecified: Secondary | ICD-10-CM | POA: Diagnosis present

## 2020-03-06 DIAGNOSIS — I5042 Chronic combined systolic (congestive) and diastolic (congestive) heart failure: Secondary | ICD-10-CM | POA: Diagnosis not present

## 2020-03-06 DIAGNOSIS — N390 Urinary tract infection, site not specified: Secondary | ICD-10-CM | POA: Diagnosis present

## 2020-03-06 DIAGNOSIS — A4181 Sepsis due to Enterococcus: Secondary | ICD-10-CM | POA: Diagnosis not present

## 2020-03-06 DIAGNOSIS — E1159 Type 2 diabetes mellitus with other circulatory complications: Secondary | ICD-10-CM | POA: Diagnosis not present

## 2020-03-06 DIAGNOSIS — J449 Chronic obstructive pulmonary disease, unspecified: Secondary | ICD-10-CM | POA: Diagnosis present

## 2020-03-06 DIAGNOSIS — I2510 Atherosclerotic heart disease of native coronary artery without angina pectoris: Secondary | ICD-10-CM | POA: Diagnosis present

## 2020-03-06 DIAGNOSIS — M2550 Pain in unspecified joint: Secondary | ICD-10-CM | POA: Diagnosis not present

## 2020-03-06 DIAGNOSIS — I130 Hypertensive heart and chronic kidney disease with heart failure and stage 1 through stage 4 chronic kidney disease, or unspecified chronic kidney disease: Secondary | ICD-10-CM | POA: Diagnosis not present

## 2020-03-06 DIAGNOSIS — E1110 Type 2 diabetes mellitus with ketoacidosis without coma: Secondary | ICD-10-CM | POA: Diagnosis present

## 2020-03-06 DIAGNOSIS — Z85038 Personal history of other malignant neoplasm of large intestine: Secondary | ICD-10-CM | POA: Diagnosis not present

## 2020-03-06 DIAGNOSIS — I10 Essential (primary) hypertension: Secondary | ICD-10-CM

## 2020-03-06 DIAGNOSIS — Z7984 Long term (current) use of oral hypoglycemic drugs: Secondary | ICD-10-CM | POA: Diagnosis not present

## 2020-03-06 DIAGNOSIS — L03314 Cellulitis of groin: Secondary | ICD-10-CM

## 2020-03-06 DIAGNOSIS — Z7982 Long term (current) use of aspirin: Secondary | ICD-10-CM | POA: Diagnosis not present

## 2020-03-06 DIAGNOSIS — Z6834 Body mass index (BMI) 34.0-34.9, adult: Secondary | ICD-10-CM | POA: Diagnosis not present

## 2020-03-06 DIAGNOSIS — I252 Old myocardial infarction: Secondary | ICD-10-CM | POA: Diagnosis not present

## 2020-03-06 DIAGNOSIS — R109 Unspecified abdominal pain: Secondary | ICD-10-CM | POA: Diagnosis not present

## 2020-03-06 DIAGNOSIS — R739 Hyperglycemia, unspecified: Secondary | ICD-10-CM

## 2020-03-06 DIAGNOSIS — R2681 Unsteadiness on feet: Secondary | ICD-10-CM | POA: Diagnosis not present

## 2020-03-06 DIAGNOSIS — L03311 Cellulitis of abdominal wall: Secondary | ICD-10-CM | POA: Diagnosis not present

## 2020-03-06 DIAGNOSIS — Z79899 Other long term (current) drug therapy: Secondary | ICD-10-CM | POA: Diagnosis not present

## 2020-03-06 DIAGNOSIS — L02214 Cutaneous abscess of groin: Secondary | ICD-10-CM | POA: Diagnosis present

## 2020-03-06 DIAGNOSIS — A419 Sepsis, unspecified organism: Secondary | ICD-10-CM | POA: Diagnosis present

## 2020-03-06 DIAGNOSIS — E1122 Type 2 diabetes mellitus with diabetic chronic kidney disease: Secondary | ICD-10-CM | POA: Diagnosis not present

## 2020-03-06 DIAGNOSIS — Z743 Need for continuous supervision: Secondary | ICD-10-CM | POA: Diagnosis not present

## 2020-03-06 DIAGNOSIS — N184 Chronic kidney disease, stage 4 (severe): Secondary | ICD-10-CM | POA: Diagnosis not present

## 2020-03-06 DIAGNOSIS — Z20822 Contact with and (suspected) exposure to covid-19: Secondary | ICD-10-CM | POA: Diagnosis not present

## 2020-03-06 DIAGNOSIS — Z7401 Bed confinement status: Secondary | ICD-10-CM | POA: Diagnosis not present

## 2020-03-06 DIAGNOSIS — D631 Anemia in chronic kidney disease: Secondary | ICD-10-CM | POA: Diagnosis present

## 2020-03-06 DIAGNOSIS — R2689 Other abnormalities of gait and mobility: Secondary | ICD-10-CM | POA: Diagnosis not present

## 2020-03-06 DIAGNOSIS — R918 Other nonspecific abnormal finding of lung field: Secondary | ICD-10-CM | POA: Diagnosis not present

## 2020-03-06 DIAGNOSIS — L02215 Cutaneous abscess of perineum: Secondary | ICD-10-CM | POA: Diagnosis present

## 2020-03-06 LAB — CBG MONITORING, ED
Glucose-Capillary: >600 mg/dL (ref 70–99)
Glucose-Capillary: 377 mg/dL — ABNORMAL HIGH (ref 70–99)
Glucose-Capillary: 437 mg/dL — ABNORMAL HIGH (ref 70–99)
Glucose-Capillary: 427 mg/dL — ABNORMAL HIGH (ref 70–99)
Glucose-Capillary: 442 mg/dL — ABNORMAL HIGH (ref 70–99)
Glucose-Capillary: 411 mg/dL — ABNORMAL HIGH (ref 70–99)
Glucose-Capillary: 454 mg/dL — ABNORMAL HIGH (ref 70–99)
Glucose-Capillary: 371 mg/dL — ABNORMAL HIGH (ref 70–99)
Glucose-Capillary: 210 mg/dL — ABNORMAL HIGH (ref 70–99)
Glucose-Capillary: 146 mg/dL — ABNORMAL HIGH (ref 70–99)
Glucose-Capillary: 161 mg/dL — ABNORMAL HIGH (ref 70–99)
Glucose-Capillary: 149 mg/dL — ABNORMAL HIGH (ref 70–99)

## 2020-03-06 LAB — I-STAT VENOUS BLOOD GAS, ED
Acid-base deficit: 7 mmol/L — ABNORMAL HIGH (ref 0.0–2.0)
Bicarbonate: 17.5 mmol/L — ABNORMAL LOW (ref 20.0–28.0)
Calcium, Ion: 1.31 mmol/L (ref 1.15–1.40)
HCT: 32 % — ABNORMAL LOW (ref 36.0–46.0)
Hemoglobin: 10.9 g/dL — ABNORMAL LOW (ref 12.0–15.0)
O2 Saturation: 44 %
Potassium: 5.3 mmol/L — ABNORMAL HIGH (ref 3.5–5.1)
Sodium: 133 mmol/L — ABNORMAL LOW (ref 135–145)
TCO2: 19 mmol/L — ABNORMAL LOW (ref 22–32)
pCO2, Ven: 32.5 mmHg — ABNORMAL LOW (ref 44.0–60.0)
pH, Ven: 7.34 (ref 7.250–7.430)
pO2, Ven: 26 mmHg — CL (ref 32.0–45.0)

## 2020-03-06 LAB — CBC
HCT: 26.6 % — ABNORMAL LOW (ref 36.0–46.0)
Hemoglobin: 8.1 g/dL — ABNORMAL LOW (ref 12.0–15.0)
MCH: 27.8 pg (ref 26.0–34.0)
MCHC: 30.5 g/dL (ref 30.0–36.0)
MCV: 91.4 fL (ref 80.0–100.0)
Platelets: 192 10*3/uL (ref 150–400)
RBC: 2.91 MIL/uL — ABNORMAL LOW (ref 3.87–5.11)
RDW: 15.7 % — ABNORMAL HIGH (ref 11.5–15.5)
WBC: 27.7 10*3/uL — ABNORMAL HIGH (ref 4.0–10.5)
nRBC: 0.1 % (ref 0.0–0.2)

## 2020-03-06 LAB — BASIC METABOLIC PANEL
Anion gap: 16 — ABNORMAL HIGH (ref 5–15)
Anion gap: 15 (ref 5–15)
Anion gap: 13 (ref 5–15)
Anion gap: 14 (ref 5–15)
BUN: 130 mg/dL — ABNORMAL HIGH (ref 8–23)
BUN: 118 mg/dL — ABNORMAL HIGH (ref 8–23)
BUN: 100 mg/dL — ABNORMAL HIGH (ref 8–23)
BUN: 94 mg/dL — ABNORMAL HIGH (ref 8–23)
CO2: 16 mmol/L — ABNORMAL LOW (ref 22–32)
CO2: 16 mmol/L — ABNORMAL LOW (ref 22–32)
CO2: 18 mmol/L — ABNORMAL LOW (ref 22–32)
CO2: 16 mmol/L — ABNORMAL LOW (ref 22–32)
Calcium: 9.3 mg/dL (ref 8.9–10.3)
Calcium: 9.1 mg/dL (ref 8.9–10.3)
Calcium: 9.2 mg/dL (ref 8.9–10.3)
Calcium: 9.1 mg/dL (ref 8.9–10.3)
Chloride: 95 mmol/L — ABNORMAL LOW (ref 98–111)
Chloride: 101 mmol/L (ref 98–111)
Chloride: 108 mmol/L (ref 98–111)
Chloride: 109 mmol/L (ref 98–111)
Creatinine, Ser: 2.79 mg/dL — ABNORMAL HIGH (ref 0.44–1.00)
Creatinine, Ser: 2.49 mg/dL — ABNORMAL HIGH (ref 0.44–1.00)
Creatinine, Ser: 2.2 mg/dL — ABNORMAL HIGH (ref 0.44–1.00)
Creatinine, Ser: 2.15 mg/dL — ABNORMAL HIGH (ref 0.44–1.00)
GFR calc Af Amer: 19 mL/min — ABNORMAL LOW (ref >60)
GFR calc Af Amer: 22 mL/min — ABNORMAL LOW (ref >60)
GFR calc Af Amer: 26 mL/min — ABNORMAL LOW (ref >60)
GFR calc Af Amer: 26 mL/min — ABNORMAL LOW (ref >60)
GFR calc non Af Amer: 17 mL/min — ABNORMAL LOW (ref >60)
GFR calc non Af Amer: 19 mL/min — ABNORMAL LOW (ref >60)
GFR calc non Af Amer: 22 mL/min — ABNORMAL LOW (ref >60)
GFR calc non Af Amer: 23 mL/min — ABNORMAL LOW (ref >60)
Glucose, Bld: 657 mg/dL (ref 70–99)
Glucose, Bld: 444 mg/dL — ABNORMAL HIGH (ref 70–99)
Glucose, Bld: 165 mg/dL — ABNORMAL HIGH (ref 70–99)
Glucose, Bld: 256 mg/dL — ABNORMAL HIGH (ref 70–99)
Potassium: 5.2 mmol/L — ABNORMAL HIGH (ref 3.5–5.1)
Potassium: 4.6 mmol/L (ref 3.5–5.1)
Potassium: 4.1 mmol/L (ref 3.5–5.1)
Potassium: 4.3 mmol/L (ref 3.5–5.1)
Sodium: 127 mmol/L — ABNORMAL LOW (ref 135–145)
Sodium: 132 mmol/L — ABNORMAL LOW (ref 135–145)
Sodium: 139 mmol/L (ref 135–145)
Sodium: 139 mmol/L (ref 135–145)

## 2020-03-06 LAB — GLUCOSE, CAPILLARY: Glucose-Capillary: 262 mg/dL — ABNORMAL HIGH (ref 70–99)

## 2020-03-06 LAB — LACTIC ACID, PLASMA
Lactic Acid, Venous: 2.9 mmol/L (ref 0.5–1.9)
Lactic Acid, Venous: 2.2 mmol/L (ref 0.5–1.9)

## 2020-03-06 LAB — SARS CORONAVIRUS 2 BY RT PCR (HOSPITAL ORDER, PERFORMED IN ~~LOC~~ HOSPITAL LAB): SARS Coronavirus 2: NEGATIVE

## 2020-03-06 LAB — BETA-HYDROXYBUTYRIC ACID: Beta-Hydroxybutyric Acid: 1.98 mmol/L — ABNORMAL HIGH (ref 0.05–0.27)

## 2020-03-06 LAB — HIV ANTIBODY (ROUTINE TESTING W REFLEX): HIV Screen 4th Generation wRfx: NONREACTIVE

## 2020-03-06 MED ORDER — ROSUVASTATIN CALCIUM 5 MG PO TABS
5.0000 mg | ORAL_TABLET | Freq: Every day | ORAL | Status: DC
  Administered 2020-03-06 – 2020-03-14 (×8): 5 mg via ORAL
  Filled 2020-03-06 (×9): qty 1

## 2020-03-06 MED ORDER — DEXTROSE IN LACTATED RINGERS 5 % IV SOLN: INTRAVENOUS | Status: DC

## 2020-03-06 MED ORDER — ONDANSETRON HCL 4 MG PO TABS: 4.0000 mg | ORAL_TABLET | Freq: Four times a day (QID) | ORAL | Status: DC | PRN

## 2020-03-06 MED ORDER — ONDANSETRON HCL 4 MG/2ML IJ SOLN: 4.0000 mg | Freq: Four times a day (QID) | INTRAMUSCULAR | Status: DC | PRN

## 2020-03-06 MED ORDER — INSULIN REGULAR(HUMAN) IN NACL 100-0.9 UT/100ML-% IV SOLN
INTRAVENOUS | Status: DC
  Administered 2020-03-06: 11 [IU]/h via INTRAVENOUS
  Filled 2020-03-06: qty 100

## 2020-03-06 MED ORDER — DEXTROSE 50 % IV SOLN: 0.0000 mL | INTRAVENOUS | Status: DC | PRN

## 2020-03-06 MED ORDER — INSULIN GLARGINE 100 UNIT/ML ~~LOC~~ SOLN
10.0000 [IU] | Freq: Every day | SUBCUTANEOUS | Status: DC
  Administered 2020-03-06 – 2020-03-07 (×2): 10 [IU] via SUBCUTANEOUS
  Filled 2020-03-06 (×2): qty 0.1

## 2020-03-06 MED ORDER — PIPERACILLIN-TAZOBACTAM 4.5 G IVPB: 4.5000 g | Freq: Three times a day (TID) | INTRAVENOUS | Status: DC

## 2020-03-06 MED ORDER — ASPIRIN 81 MG PO CHEW
81.0000 mg | CHEWABLE_TABLET | Freq: Every day | ORAL | Status: DC
  Administered 2020-03-06 – 2020-03-14 (×8): 81 mg via ORAL
  Filled 2020-03-06 (×9): qty 1

## 2020-03-06 MED ORDER — PIPERACILLIN-TAZOBACTAM 3.375 G IVPB
3.3750 g | Freq: Three times a day (TID) | INTRAVENOUS | Status: DC
  Administered 2020-03-07 – 2020-03-12 (×18): 3.375 g via INTRAVENOUS
  Filled 2020-03-06 (×18): qty 50

## 2020-03-06 MED ORDER — NEBIVOLOL HCL 10 MG PO TABS
10.0000 mg | ORAL_TABLET | Freq: Every day | ORAL | Status: DC
  Administered 2020-03-06: 10 mg via ORAL
  Filled 2020-03-06 (×2): qty 1

## 2020-03-06 MED ORDER — LACTATED RINGERS IV SOLN
INTRAVENOUS | Status: AC
  Administered 2020-03-06: 75 mL/h via INTRAVENOUS

## 2020-03-06 MED ORDER — LACTATED RINGERS IV BOLUS
1000.0000 mL | Freq: Once | INTRAVENOUS | Status: AC
  Administered 2020-03-06: 1000 mL via INTRAVENOUS

## 2020-03-06 MED ORDER — ACETAMINOPHEN 650 MG RE SUPP: 650.0000 mg | Freq: Four times a day (QID) | RECTAL | Status: DC | PRN

## 2020-03-06 MED ORDER — HYDROXYZINE HCL 25 MG PO TABS: 25.0000 mg | ORAL_TABLET | Freq: Three times a day (TID) | ORAL | Status: DC | PRN

## 2020-03-06 MED ORDER — LACTATED RINGERS IV SOLN: INTRAVENOUS | Status: DC

## 2020-03-06 MED ORDER — SODIUM CHLORIDE 0.9 % IV SOLN: 1.0000 g | INTRAVENOUS | Status: DC

## 2020-03-06 MED ORDER — SODIUM CHLORIDE 0.9 % IV SOLN
1.0000 g | Freq: Once | INTRAVENOUS | Status: AC
  Administered 2020-03-06: 1 g via INTRAVENOUS
  Filled 2020-03-06: qty 10

## 2020-03-06 MED ORDER — CALCIUM CARBONATE 1250 (500 CA) MG PO TABS
1250.0000 mg | ORAL_TABLET | Freq: Two times a day (BID) | ORAL | Status: DC
  Administered 2020-03-08 – 2020-03-14 (×13): 1250 mg via ORAL
  Filled 2020-03-06 (×14): qty 1

## 2020-03-06 MED ORDER — MORPHINE SULFATE (PF) 2 MG/ML IV SOLN: 2.0000 mg | INTRAVENOUS | Status: DC | PRN

## 2020-03-06 MED ORDER — MORPHINE SULFATE (PF) 2 MG/ML IV SOLN
2.0000 mg | INTRAVENOUS | Status: DC | PRN
  Administered 2020-03-06: 2 mg via INTRAVENOUS
  Filled 2020-03-06: qty 1

## 2020-03-06 MED ORDER — INSULIN ASPART 100 UNIT/ML ~~LOC~~ SOLN
0.0000 [IU] | Freq: Every day | SUBCUTANEOUS | Status: DC
  Administered 2020-03-07: 3 [IU] via SUBCUTANEOUS
  Administered 2020-03-07: 5 [IU] via SUBCUTANEOUS

## 2020-03-06 MED ORDER — VANCOMYCIN HCL IN DEXTROSE 1-5 GM/200ML-% IV SOLN
1000.0000 mg | INTRAVENOUS | Status: DC
  Administered 2020-03-07 – 2020-03-11 (×6): 1000 mg via INTRAVENOUS
  Filled 2020-03-06 (×8): qty 200

## 2020-03-06 MED ORDER — PANTOPRAZOLE SODIUM 20 MG PO TBEC
20.0000 mg | DELAYED_RELEASE_TABLET | Freq: Every day | ORAL | Status: DC
  Administered 2020-03-06 – 2020-03-14 (×8): 20 mg via ORAL
  Filled 2020-03-06 (×10): qty 1

## 2020-03-06 MED ORDER — HEPARIN SODIUM (PORCINE) 5000 UNIT/ML IJ SOLN
5000.0000 [IU] | Freq: Three times a day (TID) | INTRAMUSCULAR | Status: DC
  Administered 2020-03-06 – 2020-03-14 (×21): 5000 [IU] via SUBCUTANEOUS
  Filled 2020-03-06 (×22): qty 1

## 2020-03-06 MED ORDER — ACETAMINOPHEN 325 MG PO TABS
650.0000 mg | ORAL_TABLET | Freq: Four times a day (QID) | ORAL | Status: DC | PRN
  Administered 2020-03-06 – 2020-03-14 (×13): 650 mg via ORAL
  Filled 2020-03-06 (×14): qty 2

## 2020-03-06 MED ORDER — INSULIN ASPART 100 UNIT/ML ~~LOC~~ SOLN
0.0000 [IU] | Freq: Three times a day (TID) | SUBCUTANEOUS | Status: DC
  Administered 2020-03-06: 1 [IU] via SUBCUTANEOUS
  Administered 2020-03-07: 7 [IU] via SUBCUTANEOUS
  Administered 2020-03-07: 5 [IU] via SUBCUTANEOUS

## 2020-03-06 NOTE — Progress Notes (Signed)
Pharmacy Antibiotic Note  [name] is a 68 y.o. female admitted on 03/05/2020 with necrotizing fascitis.  Pharmacy has been consulted for vancomycin dosing.  Scr 2.2, Est CrCl ~ 25 ml/min  Plan: Vancomycin 1g IV q 24 hrs Zosyn 3.375g IV q 8 hrs - ordered by MD. F/u cultures, renal function and clinical course.  Height: 5\' 4"  (162.6 cm) Weight: 86.4 kg (190 lb 6.4 oz) IBW/kg (Calculated) : 54.7  Temp (24hrs), Avg:98.2 F (36.8 C), Min:97.9 F (36.6 C), Max:98.7 F (37.1 C)  Recent Labs  Lab 03/05/20 1429 03/06/20 0306 03/06/20 0603 03/06/20 0604 03/06/20 0605 03/06/20 0833 03/06/20 1433  WBC 31.7*  --  27.7*  --   --   --   --   CREATININE 2.78* 2.79*  --   --  2.49*  --  2.20*  LATICACIDVEN  --   --   --  2.9*  --  2.2*  --     Estimated Creatinine Clearance: 25.7 mL/min (A) (by C-G formula based on SCr of 2.2 mg/dL (H)).    Allergies  Allergen Reactions   Tradjenta [Linagliptin] Anaphylaxis    CP   Atorvastatin     REACTION: aches and pains   Enalapril Maleate     REACTION: cough   Farxiga [Dapagliflozin] Itching   Hydrochlorothiazide     REACTION: hair loss   Kenalog [Triamcinolone Acetonide]     HANDS NUMB    Metformin And Related     Diarrhea, dizziness   Propoxyphene N-Acetaminophen Hives   Simvastatin     REACTION: cramps   Spironolactone     REACTION: cramps    Thank you for allowing pharmacy to be a part of this patient's care.  [place] Crane, Roylene Reason, BCCP Clinical Pharmacist  03/06/2020 10:54 PM   [facility] pharmacy phone numbers are listed on [email or web address]

## 2020-03-06 NOTE — H&P (Signed)
History and Physical    [name] ZOX:[id number] DOB: 20-May-1951 DOA: 03/05/2020  PCP: [name], MD  Patient coming from: Home  I have personally briefly reviewed patient's old medical records in [place]  Chief Complaint: Generalized weakness, fall  HPI: [name] is a 69 y.o. 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  Pt in to ED with EMS after laying on floor all night from midnight to 1pm today following a fall at home.  Pt states she is in so much pain from multiple "abscesses in her groin area" that she fell.  Pt hasnt taken any meds in a week it seems.  CBG reading high.   ED Course: WBC 31k, has cellulitis under abdomen and groin area, no drainable abscesses per EDP.  Also has UTI.  Creat 2.7 which is about baseline, BUN of 130 which is not.  Bicarb of 16, anion gap of 16 and BHB of 1.98.  BGL 674.  Pt started on insulin gtt.  Given 1L LR bolus.   Review of Systems: As per HPI, otherwise all review of systems negative.  Past Medical History:  Diagnosis Date  . Anemia   . COPD (chronic obstructive pulmonary disease) ([place])   . Diabetes mellitus   . Hyperlipidemia   . Hypertension   . Ischemic cardiomyopathy 06/11/2013  . Low back pain   . NSTEMI (non-ST elevated myocardial infarction) ([place]) 06/11/2013  . Obesity   . Personal history of colon cancer   . Renal insufficiency     Past Surgical History:  Procedure Laterality Date  . COLECTOMY    . LEFT HEART CATHETERIZATION WITH CORONARY ANGIOGRAM N/A 06/09/2013   Procedure: LEFT HEART CATHETERIZATION WITH CORONARY ANGIOGRAM;  Surgeon: Ramond Dial, MD;  Location: [facility] CATH LAB;  Service: Cardiovascular;  Laterality: N/A;     reports that she has been smoking cigarettes. She has never used smokeless tobacco. She reports that she does not drink alcohol and does not use drugs.  Allergies  Allergen Reactions  . Tradjenta [Linagliptin]  Anaphylaxis    CP  . Atorvastatin     REACTION: aches and pains  . Enalapril Maleate     REACTION: cough  . Farxiga [Dapagliflozin] Itching  . Hydrochlorothiazide     REACTION: hair loss  . Kenalog [Triamcinolone Acetonide]     HANDS NUMB   . Metformin And Related     Diarrhea, dizziness  . Propoxyphene N-Acetaminophen Hives  . Simvastatin     REACTION: cramps  . Spironolactone     REACTION: cramps    Family History  Problem Relation Age of Onset  . Hypertension Other      Prior to Admission medications   Medication Sig Start Date End Date Taking? Authorizing Provider  amLODipine (NORVASC) 10 MG tablet TAKE ONE-HALF TABLET BY  MOUTH DAILY Patient taking differently: Take 5 mg by mouth daily.  12/19/19  Yes [name], Evie Lacks, MD  aspirin 81 MG chewable tablet Chew 1 tablet (81 mg total) by mouth daily. 06/11/13  Yes Erick Colace, NP  BYSTOLIC 10 MG tablet TAKE 1 TABLET BY MOUTH EVERY DAY Patient taking differently: Take 10 mg by mouth daily.  12/18/19  Yes [name], Evie Lacks, MD  calcium carbonate (OS-CAL) 600 MG TABS tablet Take 600 mg by mouth 2 (two) times daily with a meal.   Yes [provider]  Cholecalciferol (VITAMIN D3) 50 MCG (2000 UT) capsule  Take 1 capsule (2,000 Units total) by mouth daily. 09/21/19  Yes [name], Evie Lacks, MD  glipiZIDE (GLUCOTROL XL) 5 MG 24 hr tablet TAKE 1 TABLET BY MOUTH  DAILY WITH BREAKFAST Patient taking differently: Take 5 mg by mouth daily with breakfast.  12/19/19  Yes Philemon Kingdom, MD  hydrALAZINE (APRESOLINE) 25 MG tablet TAKE 1 TABLET BY MOUTH THREE TIMES A DAY Patient taking differently: Take 25 mg by mouth 3 (three) times daily.  12/19/19  Yes Nahser, Wonda Cheng, MD  hydrOXYzine (ATARAX/VISTARIL) 25 MG tablet Take 1-2 tablets (25-50 mg total) by mouth every 8 (eight) hours as needed for itching. 12/08/16  Yes [name], Evie Lacks, MD  lansoprazole (PREVACID) 15 MG capsule Take 1 capsule (15 mg total) by mouth daily  at 12 noon. Patient taking differently: Take 15 mg by mouth daily as needed (acid reflux).  01/10/19  Yes [name], Evie Lacks, MD  nitroGLYCERIN (NITROSTAT) 0.4 MG SL tablet DISSOLVE 1 TABLET UNDER THE TONGUE EVERY 5 MINUTES AS  NEEDED FOR CHEST PAIN Patient taking differently: Place 0.4 mg under the tongue every 5 (five) minutes as needed for chest pain. DISSOLVE 1 TABLET UNDER THE TONGUE EVERY 5 MINUTES AS  NEEDED FOR CHEST PAIN 05/16/19  Yes [name], Evie Lacks, MD  ondansetron (ZOFRAN) 4 MG tablet Take 1 tablet (4 mg total) by mouth every 8 (eight) hours as needed for nausea or vomiting. 03/28/19  Yes [name], Evie Lacks, MD  potassium chloride SA (KLOR-CON) 20 MEQ tablet TAKE 1 TABLET BY MOUTH  DAILY Patient taking differently: Take 20 mEq by mouth daily as needed (cramping).  12/19/19  Yes [name], Evie Lacks, MD  rosuvastatin (CRESTOR) 5 MG tablet TAKE 1 TABLET BY MOUTH  DAILY AT 6PM Patient taking differently: Take 5 mg by mouth daily.  02/27/19  Yes [name], Evie Lacks, MD  torsemide (DEMADEX) 100 MG tablet TAKE 0.5-1 TABLETS (50-100 MG TOTAL) BY MOUTH DAILY. Patient taking differently: Take 50 mg by mouth daily.  12/18/19  Yes [name], Evie Lacks, MD  vitamin B-12 (CYANOCOBALAMIN) 1000 MCG tablet Take 1,000 mcg by mouth daily.   Yes [provider]  glucose blood (ONETOUCH ULTRA) test strip USE TO TEST BLOOD SUGAR TWO TIMES A DAY AS DIRECTED 01/10/19   [name], Evie Lacks, MD    Physical Exam: Vitals:   03/05/20 1958 03/06/20 0104 03/06/20 0245 03/06/20 0400  BP: 136/60 125/67 (!) 147/74 (!) 138/123  Pulse: 82 82 80 95  Resp: 16 20 (!) 24 (!) 24  Temp:  97.9 F (36.6 C)    TempSrc:  Oral    SpO2: 100% 100% 100% 100%    Constitutional: NAD, calm, comfortable Eyes: PERRL, lids and conjunctivae normal ENMT: Mucous membranes are moist. Posterior pharynx clear of any exudate or lesions.Normal dentition.  Neck: normal, supple, no masses, no thyromegaly Respiratory: clear to  auscultation bilaterally, no wheezing, no crackles. Normal respiratory effort. No accessory muscle use.  Cardiovascular: Regular rate and rhythm, no murmurs / rubs / gallops. No extremity edema. 2+ pedal pulses. No carotid bruits.  Abdomen: no tenderness, no masses palpated. No hepatosplenomegaly. Bowel sounds positive.  Musculoskeletal: no clubbing / cyanosis. No joint deformity upper and lower extremities. Good ROM, no contractures. Normal muscle tone.  Skin: Cellulitis of groin Neurologic: CN 2-12 grossly intact. Sensation intact, DTR normal. Strength 5/5 in all 4.  Psychiatric: Normal judgment and insight. Alert and oriented x 3. Normal mood.    Labs on Admission: I have personally reviewed following labs  and imaging studies  CBC: Recent Labs  Lab 03/05/20 1429 03/06/20 0319  WBC 31.7*  --   HGB 8.6* 10.9*  HCT 28.5* 32.0*  MCV 93.1  --   PLT 193  --    Basic Metabolic Panel: Recent Labs  Lab 03/05/20 1429 03/06/20 0306 03/06/20 0319  NA 129* 127* 133*  K 4.6 5.2* 5.3*  CL 98 95*  --   CO2 16* 16*  --   GLUCOSE 674* 657*  --   BUN 119* 130*  --   CREATININE 2.78* 2.79*  --   CALCIUM 9.2 9.3  --    [name]: CrCl cannot be calculated (Unknown ideal weight.). Liver Function Tests: No results for input(s): AST, ALT, ALKPHOS, BILITOT, PROT, ALBUMIN in the last 168 hours. No results for input(s): LIPASE, AMYLASE in the last 168 hours. No results for input(s): AMMONIA in the last 168 hours. Coagulation Profile: No results for input(s): INR, PROTIME in the last 168 hours. Cardiac Enzymes: Recent Labs  Lab 03/05/20 1429  CKTOTAL 104   BNP (last 3 results) No results for input(s): PROBNP in the last 8760 hours. HbA1C: No results for input(s): HGBA1C in the last 72 hours. CBG: Recent Labs  Lab 03/05/20 1428 03/06/20 0143 03/06/20 0425  GLUCAP 547* >600* 377*   Lipid Profile: No results for input(s): CHOL, HDL, LDLCALC, TRIG, CHOLHDL, LDLDIRECT in the last 72  hours. Thyroid Function Tests: No results for input(s): TSH, T4TOTAL, FREET4, T3FREE, THYROIDAB in the last 72 hours. Anemia Panel: No results for input(s): VITAMINB12, FOLATE, FERRITIN, TIBC, IRON, RETICCTPCT in the last 72 hours. Urine analysis:    Component Value Date/Time   COLORURINE YELLOW 03/05/2020 2040   APPEARANCEUR CLOUDY (A) 03/05/2020 2040   LABSPEC 1.010 03/05/2020 2040   PHURINE 5.0 03/05/2020 2040   GLUCOSEU >=500 (A) 03/05/2020 2040   GLUCOSEU NEGATIVE 10/03/2013 1004   HGBUR MODERATE (A) 03/05/2020 2040   BILIRUBINUR NEGATIVE 03/05/2020 2040   KETONESUR NEGATIVE 03/05/2020 2040   PROTEINUR NEGATIVE 03/05/2020 2040   UROBILINOGEN 0.2 10/03/2013 1004   NITRITE NEGATIVE 03/05/2020 2040   LEUKOCYTESUR LARGE (A) 03/05/2020 2040    Radiological Exams on Admission: No results found.  EKG: Independently reviewed.  Assessment/Plan Principal Problem:   Cellulitis Active Problems:   Controlled type 2 diabetes mellitus with circulatory disorder, without long-term current use of insulin (HCC)   Hypertension associated with diabetes ([place])   Chronic combined systolic and diastolic CHF (congestive heart failure) ([place])   CKD stage 4 due to type 2 diabetes mellitus ([place])   Hyperglycemia   Acute lower UTI   DKA, type 2 ([place])   Sepsis ([place])    1. Cellulitis of abdomen, also UTI - with documented tachypnea and WBC 31k technically meets sepsis criteria as well 1. Cellulitis pathway 2. Rocephin 3. UCx pending 4. Ordering BCx though this will be post-abx 5. Ordering lactic acid 6. Trend WBC on CBC 7. IVF: 1L bolus then 125 cc/hr LR 8. Check MRSA pcr nares, if positive add vanc 2. DM2 with mild DKA - 1. Pt already on insulin gtt, got 1L bolus and IVF going 2. BMP Q4H added 3. NPO 4. DKA very mild at this point with BHB of just 1.98 and anion gap of 16 5. Do want to make sure that the bicarb improves with treatment of DKA and we arnt looking at an underlying NAG  metabolic acidosis due to worsening kidney function 3. CKD 4 - 1. Likely with pre-renal acute component  given BUN elevation today 2. IVF as above 3. Holding torsemide 4. Strict intake and output 5. nephro consult if not rapidly improving 4. HTN - 1. Cont bystolic 2. Hold amlodipine 3. Hold hydralazine 4. Restart these 2 depending on what BP does this morning 5. Chronic combined CHF - 1. Holding torsemide  DVT prophylaxis: Heparin [place] Code Status: Full Family Communication: No family in room Disposition Plan: Home after DKA resolved, sepsis improved, kidney improved Consults called: None Admission status: Place in 38    Malique Driskill, [facility]  How to contact the [facility] Attending or Consulting provider [place] or covering provider during after hours [place], for this patient?  1. Check the care team in [facility] and look for a) attending/consulting TRH provider listed and b) the [facility] team listed 2. Log into [email or web address]  Amion Physician Scheduling and messaging for groups and whole hospitals  On call and physician scheduling software for group practices, residents, hospitalists and other medical providers for call, clinic, rotation and shift schedules. OnCall [employer] is a hospital-wide system for scheduling doctors and paging doctors on call. EasyPlot is for scientific plotting and data analysis.  [email or web address]  and use [place]'s universal password to access. If you do not have the password, please contact the hospital operator.  3. Locate the [facility] provider you are looking for under [facility] and page to a number that you can be directly reached. 4. If you still have difficulty reaching the provider, please page the [facility] (Director on Call) for the Hospitalists listed on amion for assistance.  03/06/2020, 4:45 AM

## 2020-03-06 NOTE — Progress Notes (Signed)
PROGRESS NOTE  [name] XHB:[id number] DOB: 1950-09-05 DOA: 03/05/2020 PCP: [name], MD  HPI/Recap of past 24 hours: HPI from [name] [name] is a 69 y.o. 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 Pt in to ED with EMS after laying on floor all night following a fall at home.  Pt states she is in so much pain from multiple "abscesses in her groin area" that she fell.  Pt hasnt taken any meds in a week it seems.  CBG reading high. ED Course: WBC 31k, has cellulitis under abdomen and groin area, no drainable abscesses per EDP.  Also has UTI.  Creat 2.7 which is about baseline, BUN of 130 which is not. Bicarb of 16, anion gap of 16 and BHB of 1.98. BGL 674. Pt started on insulin gtt.  Given 1L LR bolus.  Patient admitted for further management.    Today, patient still reports significant abdominal and groin pain, appeared lethargic.  Denied any nausea/vomiting, fever/chills, chest pain, shortness of breath.     Assessment/Plan: Principal Problem:   Cellulitis Active Problems:   Controlled type 2 diabetes mellitus with circulatory disorder, without long-term current use of insulin (HCC)   Hypertension associated with diabetes ([place])   Chronic combined systolic and diastolic CHF (congestive heart failure) ([place])   CKD stage 4 due to type 2 diabetes mellitus ([place])   Hyperglycemia   Acute lower UTI   DKA, type 2 ([place])   Sepsis ([place])   Sepsis likely 2/2 cellulitis of the abdomen On admission, noted leukocytosis, tachypnea, lactic acidosis Currently afebrile, with leukocytosis BC x2 pending Chest x-ray pending CT abdomen/pelvis pending Continue ceftriaxone as per cellulitis order set Continue IV fluids Monitor closely  DKA A1c on 01/10/2020 was 7.2 S/p insulin drip, with improvement in CBG and closed anion gap, although still remains acidotic which could likely be from worsening kidney function SSI, Lantus,  Accu-Cheks, hypoglycemic protocol Monitor closely  Possible UTI UA with moderate hemoglobin, large leukocytes, few bacteria, WBC 21-50 UC pending Continue ceftriaxone as above  Hypertension BP currently soft Hold home Bystolic, amlodipine, hydralazine for now, restart pending BP  CKD stage IV/acidosis Creatinine at baseline Continue gentle hydration due to soft BP Hold home torsemide for now Strict I's and O's Monitor closely  Chronic diastolic HF/history of ischemic cardiomyopathy History of stent placement Echo done on 05/2019 showed EF of 60 to 81%, grade 2 diastolic dysfunction Gentle hydration for now Monitor closely        Malnutrition Type:      Malnutrition Characteristics:      Nutrition Interventions:       Estimated body mass index is 34.39 kg/m as calculated from the following:   Height as of 01/10/20: 5\' 2"  (1.575 m).   Weight as of 01/10/20: 85.3 kg.     Code Status: Full  Family Communication: Discussed with patient  Disposition Plan: Status is: Inpatient  Remains inpatient appropriate because:Inpatient level of care appropriate due to severity of illness   Dispo: The patient is from: Home              Anticipated d/c is to: Home              Anticipated d/c date is: 3 days              Patient currently is not medically stable to d/c.    Consultants:  None  Procedures:  None  Antimicrobials:  Ceftriaxone  DVT prophylaxis: [place] Heparin   Objective: Vitals:   03/06/20 1000 03/06/20 1400 03/06/20 1500 03/06/20 1600  BP: (!) 112/45 (!) 97/58 114/60 (!) 112/59  Pulse: 93 87 83 77  Resp: (!) 26 (!) 22 (!) 21 (!) 21  Temp:      TempSrc:      SpO2: 100% 98% 100% 100%    Intake/Output Summary (Last 24 hours) at 03/06/2020 1658 Last data filed at 03/06/2020 0430 Gross per 24 hour  Intake 1100 ml  Output --  Net 1100 ml   There were no vitals filed for this visit.  Exam:  General: NAD, lethargic  Cardiovascular: S1,  S2 present  Respiratory: CTAB  Abdomen: Soft, +tender, nondistended, bowel sounds present  Musculoskeletal: No bilateral pedal edema noted  Skin:  Noted tenderness, induration around lower abdominal fold and groin area  Psychiatry: Normal mood    Data Reviewed: CBC: Recent Labs  Lab 03/05/20 1429 03/06/20 0319 03/06/20 0603  WBC 31.7*  --  27.7*  HGB 8.6* 10.9* 8.1*  HCT 28.5* 32.0* 26.6*  MCV 93.1  --  91.4  PLT 193  --  284   Basic Metabolic Panel: Recent Labs  Lab 03/05/20 1429 03/06/20 0306 03/06/20 0319 03/06/20 0605 03/06/20 1433  NA 129* 127* 133* 132* 139  K 4.6 5.2* 5.3* 4.6 4.1  CL 98 95*  --  101 108  CO2 16* 16*  --  16* 18*  GLUCOSE 674* 657*  --  444* 165*  BUN 119* 130*  --  118* 100*  CREATININE 2.78* 2.79*  --  2.49* 2.20*  CALCIUM 9.2 9.3  --  9.1 9.2   [name]: CrCl cannot be calculated (Unknown ideal weight.). Liver Function Tests: No results for input(s): AST, ALT, ALKPHOS, BILITOT, PROT, ALBUMIN in the last 168 hours. No results for input(s): LIPASE, AMYLASE in the last 168 hours. No results for input(s): AMMONIA in the last 168 hours. Coagulation Profile: No results for input(s): INR, PROTIME in the last 168 hours. Cardiac Enzymes: Recent Labs  Lab 03/05/20 1429  CKTOTAL 104   BNP (last 3 results) No results for input(s): PROBNP in the last 8760 hours. HbA1C: No results for input(s): HGBA1C in the last 72 hours. CBG: Recent Labs  Lab 03/06/20 1101 03/06/20 1207 03/06/20 1317 03/06/20 1441 03/06/20 1558  GLUCAP 371* 210* 146* 161* 149*   Lipid Profile: No results for input(s): CHOL, HDL, LDLCALC, TRIG, CHOLHDL, LDLDIRECT in the last 72 hours. Thyroid Function Tests: No results for input(s): TSH, T4TOTAL, FREET4, T3FREE, THYROIDAB in the last 72 hours. Anemia Panel: No results for input(s): VITAMINB12, FOLATE, FERRITIN, TIBC, IRON, RETICCTPCT in the last 72 hours. Urine analysis:    Component Value Date/Time    COLORURINE YELLOW 03/05/2020 2040   APPEARANCEUR CLOUDY (A) 03/05/2020 2040   LABSPEC 1.010 03/05/2020 2040   PHURINE 5.0 03/05/2020 2040   GLUCOSEU >=500 (A) 03/05/2020 2040   GLUCOSEU NEGATIVE 10/03/2013 1004   HGBUR MODERATE (A) 03/05/2020 2040   BILIRUBINUR NEGATIVE 03/05/2020 2040   KETONESUR NEGATIVE 03/05/2020 2040   PROTEINUR NEGATIVE 03/05/2020 2040   UROBILINOGEN 0.2 10/03/2013 1004   NITRITE NEGATIVE 03/05/2020 2040   LEUKOCYTESUR LARGE (A) 03/05/2020 2040   Sepsis Labs: @LABRCNTIP (procalcitonin:4,lacticidven:4)  ) Recent Results (from the past 240 hour(s))  SARS Coronavirus 2 by RT PCR (hospital order, performed in [facility] hospital lab) Nasopharyngeal Nasopharyngeal Swab     Status: None   Collection Time: 03/06/20  3:06 AM  Specimen: Nasopharyngeal Swab  Result Value Ref Range Status   SARS Coronavirus 2 NEGATIVE NEGATIVE Final    Comment: (NOTE) SARS-CoV-2 target nucleic acids are NOT DETECTED.  The SARS-CoV-2 RNA is generally detectable in upper and lower respiratory specimens during the acute phase of infection. The lowest concentration of SARS-CoV-2 viral copies this assay can detect is 250 copies / mL. A negative result does not preclude SARS-CoV-2 infection and should not be used as the sole basis for treatment or other patient management decisions.  A negative result may occur with improper specimen collection / handling, submission of specimen other than nasopharyngeal swab, presence of viral mutation(s) within the areas targeted by this assay, and inadequate number of viral copies (<250 copies / mL). A negative result must be combined with clinical observations, patient history, and epidemiological information.  Fact Sheet for Patients:   StrictlyIdeas.no  Fact Sheet for Healthcare Providers: BankingDealers.co.za  This test is not yet approved or  cleared by the [place] FDA and has been  authorized for detection and/or diagnosis of SARS-CoV-2 by FDA under an Emergency Use Authorization (EUA).  This EUA will remain in effect (meaning this test can be used) for the duration of the COVID-19 declaration under Section 564(b)(1) of the Act, 21 U.S.C. section 360bbb-3(b)(1), unless the authorization is terminated or revoked sooner.  Performed at [facility], [place] [address], [place], [place] [address]       Studies: No results found.  Scheduled Meds: . aspirin  81 mg Oral Daily  . calcium carbonate  1,250 mg Oral BID WC  . heparin  5,000 Units Subcutaneous Q8H  . insulin aspart  0-5 Units Subcutaneous QHS  . insulin aspart  0-9 Units Subcutaneous TID WC  . insulin glargine  10 Units Subcutaneous Daily  . nebivolol  10 mg Oral Daily  . pantoprazole  20 mg Oral Daily  . rosuvastatin  5 mg Oral Daily    Continuous Infusions: . [START ON 03/07/2020] cefTRIAXone (ROCEPHIN)  IV    . dextrose 5% lactated ringers 125 mL/hr at 03/06/20 1212  . lactated ringers Stopped (03/06/20 1212)     LOS: 0 days     Alma Friendly, MD [facility]  If 7PM-7AM, please contact night-coverage [email or web address] 03/06/2020, 4:58 PM

## 2020-03-06 NOTE — ED Notes (Addendum)
Pt resting with eyes closed in comfortable position

## 2020-03-06 NOTE — Progress Notes (Signed)
Inpatient Diabetes Program Recommendations  AACE/ADA: New Consensus Statement on Inpatient Glycemic Control (2015)  Target Ranges:  Prepandial:   less than 140 mg/dL      Peak postprandial:   less than 180 mg/dL (1-2 hours)      Critically ill patients:  140 - 180 mg/dL   Lab Results  Component Value Date   GLUCAP 146 (H) 03/06/2020   HGBA1C 7.2 (A) 01/10/2020    Review of Glycemic Control Results for [name], [name] (MRN [id number]) as of 03/06/2020 13:44  Ref. Range 03/06/2020 08:30 03/06/2020 09:36 03/06/2020 10:19 03/06/2020 11:01 03/06/2020 12:07 03/06/2020 13:17  Glucose-Capillary Latest Ref Range: 70 - 99 mg/dL 442 (H) 454 (H) 411 (H) 371 (H) 210 (H) 146 (H)   Diabetes history: DM 2 Outpatient Diabetes medications:  Glucotrol XL 5 mg daily Current orders for Inpatient glycemic control:  IV insulin  Inpatient Diabetes Program Recommendations:    Note that patient admitted with hyperglycemia. Interestingly, patient saw endocrinologist in July 2021 and A1C was 7.2% with only Glucotrol 5 mg daily. Note cellulitis which likely has increased blood sugars.    Consider adding Lantus 12 units- 2 hours prior to d/c of IV insulin and once acidosis cleared.  Will need to follow closely for insulin needs during hospitalization.  According to Endocrinologist's note, patient has refused insulin in the past.   Thanks,  Adah Perl, RN, BC-ADM Inpatient Diabetes Coordinator Pager [phone] (8a-5p)

## 2020-03-06 NOTE — ED Notes (Signed)
Paged provider [place] regarding insulin drip and if she would like it to be continue. Pt glucose at this time is 145

## 2020-03-06 NOTE — Progress Notes (Signed)
Received call from Dr Lovena Le, Radiologist advising CT results showed severe cellulitis and aggressive soft tissue infection, deemed to warrant emergent surgical consultation.  Changed orders to reflect NPO.  Vital signs stable at this time.  Per pt, last PO intake was dinner in the ER at 6pm, no other foods or fluids since that time.  Paged and spoke with on-call Gen Surgery, Dr Georgette Dover advising findings of CT scan per radiology.  He requested a doc at [facility] see patient as he had three trauma emergencies and pt was only 3 hours NPO at this time.  Spoke with floor coverage who will see pt at [facility]

## 2020-03-06 NOTE — Progress Notes (Signed)
APP at [facility] contacted me to see this patient at the request of gen surg on call. Apparently CT scan critical results were called to APP indicating the need for possible emergent surgery. Upon my evaluation, patient is stoic reporting that she feels fine, but is cold. She goes on to say that he has pain in her abdomen that is sharp, burning, pressure, and achy. It is slightly improved with her recent BM. She reports that she has boils causing the pain, and that she has never had boils before, or required an I and D. Her abdomen has three distinctly indurated areas that are firm, erythematous, and tender. On the left the induration extends into groin, and then into the medial aspect of her thigh. Her vitals are stable. One abscess is draining purulent contents as well posteriorly. Patient is on rocephin for possible UTI. I discussed these findings with gen surg on call. I have advised that we broaden antibiotics. Since it is purulent - vancomycin per pharm consult. Zosyn to cover other nec fasc pathogens.  Dr. Georgette Dover reports that he will see as soon as he is out of OR.

## 2020-03-06 NOTE — ED Notes (Signed)
Lab called regarding COVID swab collection and pending result.

## 2020-03-07 ENCOUNTER — Encounter (HOSPITAL_COMMUNITY): Payer: Self-pay | Admitting: Internal Medicine

## 2020-03-07 ENCOUNTER — Encounter (HOSPITAL_COMMUNITY)
Admission: EM | Disposition: A | Discharge status: Skilled Nursing Facility | Payer: Self-pay | Source: Home / Self Care | Attending: Internal Medicine

## 2020-03-07 ENCOUNTER — Inpatient Hospital Stay (HOSPITAL_COMMUNITY): Payer: Medicare Other | Admitting: Anesthesiology

## 2020-03-07 DIAGNOSIS — E1122 Type 2 diabetes mellitus with diabetic chronic kidney disease: Secondary | ICD-10-CM

## 2020-03-07 DIAGNOSIS — E1110 Type 2 diabetes mellitus with ketoacidosis without coma: Secondary | ICD-10-CM

## 2020-03-07 DIAGNOSIS — N184 Chronic kidney disease, stage 4 (severe): Secondary | ICD-10-CM

## 2020-03-07 DIAGNOSIS — I5042 Chronic combined systolic (congestive) and diastolic (congestive) heart failure: Secondary | ICD-10-CM

## 2020-03-07 DIAGNOSIS — N390 Urinary tract infection, site not specified: Secondary | ICD-10-CM

## 2020-03-07 DIAGNOSIS — L03311 Cellulitis of abdominal wall: Secondary | ICD-10-CM

## 2020-03-07 DIAGNOSIS — A419 Sepsis, unspecified organism: Secondary | ICD-10-CM

## 2020-03-07 HISTORY — PX: INCISION AND DRAINAGE PERIRECTAL ABSCESS: SHX1804

## 2020-03-07 LAB — CBC WITH DIFFERENTIAL/PLATELET
Abs Immature Granulocytes: 0.94 10*3/uL — ABNORMAL HIGH (ref 0.00–0.07)
Basophils Absolute: 0.1 10*3/uL (ref 0.0–0.1)
Basophils Relative: 0 %
Eosinophils Absolute: 0.1 10*3/uL (ref 0.0–0.5)
Eosinophils Relative: 0 %
HCT: 24.2 % — ABNORMAL LOW (ref 36.0–46.0)
Hemoglobin: 7.8 g/dL — ABNORMAL LOW (ref 12.0–15.0)
Immature Granulocytes: 3 %
Lymphocytes Relative: 3 %
Lymphs Abs: 0.8 10*3/uL (ref 0.7–4.0)
MCH: 28.4 pg (ref 26.0–34.0)
MCHC: 32.2 g/dL (ref 30.0–36.0)
MCV: 88 fL (ref 80.0–100.0)
Monocytes Absolute: 1.3 10*3/uL — ABNORMAL HIGH (ref 0.1–1.0)
Monocytes Relative: 5 %
Neutro Abs: 24.7 10*3/uL — ABNORMAL HIGH (ref 1.7–7.7)
Neutrophils Relative %: 89 %
Platelets: 184 10*3/uL (ref 150–400)
RBC: 2.75 MIL/uL — ABNORMAL LOW (ref 3.87–5.11)
RDW: 15.5 % (ref 11.5–15.5)
WBC: 27.8 10*3/uL — ABNORMAL HIGH (ref 4.0–10.5)
nRBC: 0.1 % (ref 0.0–0.2)

## 2020-03-07 LAB — BASIC METABOLIC PANEL
Anion gap: 13 (ref 5–15)
Anion gap: 11 (ref 5–15)
BUN: 99 mg/dL — ABNORMAL HIGH (ref 8–23)
BUN: 91 mg/dL — ABNORMAL HIGH (ref 8–23)
CO2: 18 mmol/L — ABNORMAL LOW (ref 22–32)
CO2: 19 mmol/L — ABNORMAL LOW (ref 22–32)
Calcium: 8.9 mg/dL (ref 8.9–10.3)
Calcium: 9 mg/dL (ref 8.9–10.3)
Chloride: 107 mmol/L (ref 98–111)
Chloride: 110 mmol/L (ref 98–111)
Creatinine, Ser: 2.1 mg/dL — ABNORMAL HIGH (ref 0.44–1.00)
Creatinine, Ser: 2.17 mg/dL — ABNORMAL HIGH (ref 0.44–1.00)
GFR calc Af Amer: 27 mL/min — ABNORMAL LOW (ref >60)
GFR calc Af Amer: 26 mL/min — ABNORMAL LOW (ref >60)
GFR calc non Af Amer: 23 mL/min — ABNORMAL LOW (ref >60)
GFR calc non Af Amer: 23 mL/min — ABNORMAL LOW (ref >60)
Glucose, Bld: 280 mg/dL — ABNORMAL HIGH (ref 70–99)
Glucose, Bld: 282 mg/dL — ABNORMAL HIGH (ref 70–99)
Potassium: 4.7 mmol/L (ref 3.5–5.1)
Potassium: 4.3 mmol/L (ref 3.5–5.1)
Sodium: 138 mmol/L (ref 135–145)
Sodium: 140 mmol/L (ref 135–145)

## 2020-03-07 LAB — GLUCOSE, CAPILLARY
Glucose-Capillary: 274 mg/dL — ABNORMAL HIGH (ref 70–99)
Glucose-Capillary: 346 mg/dL — ABNORMAL HIGH (ref 70–99)
Glucose-Capillary: 288 mg/dL — ABNORMAL HIGH (ref 70–99)
Glucose-Capillary: 349 mg/dL — ABNORMAL HIGH (ref 70–99)
Glucose-Capillary: 365 mg/dL — ABNORMAL HIGH (ref 70–99)

## 2020-03-07 LAB — LACTIC ACID, PLASMA
Lactic Acid, Venous: 2 mmol/L (ref 0.5–1.9)
Lactic Acid, Venous: 1.7 mmol/L (ref 0.5–1.9)

## 2020-03-07 LAB — MRSA PCR SCREENING: MRSA by PCR: NEGATIVE

## 2020-03-07 SURGERY — INCISION AND DRAINAGE, ABSCESS, PERIRECTAL
Anesthesia: General

## 2020-03-07 MED ORDER — LACTATED RINGERS IV SOLN: INTRAVENOUS | Status: DC

## 2020-03-07 MED ORDER — CHLORHEXIDINE GLUCONATE 0.12 % MT SOLN
15.0000 mL | OROMUCOSAL | Status: AC
  Filled 2020-03-07 (×2): qty 15

## 2020-03-07 MED ORDER — PROMETHAZINE HCL 25 MG/ML IJ SOLN: 6.2500 mg | INTRAMUSCULAR | Status: DC | PRN

## 2020-03-07 MED ORDER — MIDAZOLAM HCL 2 MG/2ML IJ SOLN
INTRAMUSCULAR | Status: AC
  Filled 2020-03-07: qty 2

## 2020-03-07 MED ORDER — MIDAZOLAM HCL 5 MG/5ML IJ SOLN
INTRAMUSCULAR | Status: DC | PRN
  Administered 2020-03-07 (×2): 1 mg via INTRAVENOUS

## 2020-03-07 MED ORDER — LIDOCAINE 2% (20 MG/ML) 5 ML SYRINGE
INTRAMUSCULAR | Status: DC | PRN
  Administered 2020-03-07: 80 mg via INTRAVENOUS

## 2020-03-07 MED ORDER — JUVEN PO PACK
1.0000 | PACK | Freq: Two times a day (BID) | ORAL | Status: DC
  Administered 2020-03-08 – 2020-03-11 (×7): 1 via ORAL
  Filled 2020-03-07 (×8): qty 1

## 2020-03-07 MED ORDER — CHLORHEXIDINE GLUCONATE 0.12 % MT SOLN
OROMUCOSAL | Status: AC
  Administered 2020-03-07: 15 mL via OROMUCOSAL
  Filled 2020-03-07: qty 15

## 2020-03-07 MED ORDER — PHENYLEPHRINE HCL-NACL 10-0.9 MG/250ML-% IV SOLN
INTRAVENOUS | Status: DC | PRN
  Administered 2020-03-07: 100 ug/min via INTRAVENOUS

## 2020-03-07 MED ORDER — PROPOFOL 10 MG/ML IV BOLUS
INTRAVENOUS | Status: DC | PRN
  Administered 2020-03-07: 150 mg via INTRAVENOUS

## 2020-03-07 MED ORDER — CHLORHEXIDINE GLUCONATE CLOTH 2 % EX PADS
6.0000 | MEDICATED_PAD | Freq: Once | CUTANEOUS | Status: AC
  Administered 2020-03-07: 6 via TOPICAL

## 2020-03-07 MED ORDER — MORPHINE SULFATE (PF) 2 MG/ML IV SOLN: 2.0000 mg | INTRAVENOUS | Status: DC | PRN

## 2020-03-07 MED ORDER — BUPIVACAINE HCL (PF) 0.25 % IJ SOLN
INTRAMUSCULAR | Status: AC
  Filled 2020-03-07: qty 30

## 2020-03-07 MED ORDER — HYDROMORPHONE HCL 1 MG/ML IJ SOLN
1.0000 mg | INTRAMUSCULAR | Status: DC | PRN
  Administered 2020-03-08: 1 mg via INTRAVENOUS
  Filled 2020-03-07: qty 1

## 2020-03-07 MED ORDER — CEFAZOLIN SODIUM-DEXTROSE 2-4 GM/100ML-% IV SOLN
2.0000 g | INTRAVENOUS | Status: AC
  Administered 2020-03-07: 2 g via INTRAVENOUS
  Filled 2020-03-07: qty 100

## 2020-03-07 MED ORDER — 0.9 % SODIUM CHLORIDE (POUR BTL) OPTIME
TOPICAL | Status: DC | PRN
  Administered 2020-03-07: 1000 mL

## 2020-03-07 MED ORDER — ADULT MULTIVITAMIN W/MINERALS CH
1.0000 | ORAL_TABLET | Freq: Every day | ORAL | Status: DC
  Administered 2020-03-08 – 2020-03-14 (×7): 1 via ORAL
  Filled 2020-03-07 (×7): qty 1

## 2020-03-07 MED ORDER — INSULIN GLARGINE 100 UNIT/ML ~~LOC~~ SOLN
15.0000 [IU] | Freq: Every day | SUBCUTANEOUS | Status: DC
  Administered 2020-03-08: 15 [IU] via SUBCUTANEOUS
  Filled 2020-03-07 (×2): qty 0.15

## 2020-03-07 MED ORDER — SUGAMMADEX SODIUM 200 MG/2ML IV SOLN
INTRAVENOUS | Status: DC | PRN
  Administered 2020-03-07: 350 mg via INTRAVENOUS

## 2020-03-07 MED ORDER — CHLORHEXIDINE GLUCONATE CLOTH 2 % EX PADS: 6.0000 | MEDICATED_PAD | Freq: Once | CUTANEOUS | Status: DC

## 2020-03-07 MED ORDER — INSULIN ASPART 100 UNIT/ML ~~LOC~~ SOLN
0.0000 [IU] | Freq: Three times a day (TID) | SUBCUTANEOUS | Status: DC
  Administered 2020-03-08: 11 [IU] via SUBCUTANEOUS
  Administered 2020-03-08: 15 [IU] via SUBCUTANEOUS
  Administered 2020-03-09: 11 [IU] via SUBCUTANEOUS
  Administered 2020-03-09: 2 [IU] via SUBCUTANEOUS
  Administered 2020-03-09: 11 [IU] via SUBCUTANEOUS
  Administered 2020-03-10 (×2): 2 [IU] via SUBCUTANEOUS
  Administered 2020-03-10: 3 [IU] via SUBCUTANEOUS
  Administered 2020-03-11: 5 [IU] via SUBCUTANEOUS
  Administered 2020-03-12: 3 [IU] via SUBCUTANEOUS
  Administered 2020-03-13: 2 [IU] via SUBCUTANEOUS
  Administered 2020-03-13: 3 [IU] via SUBCUTANEOUS

## 2020-03-07 MED ORDER — FENTANYL CITRATE (PF) 250 MCG/5ML IJ SOLN
INTRAMUSCULAR | Status: DC | PRN
  Administered 2020-03-07 (×2): 100 ug via INTRAVENOUS

## 2020-03-07 MED ORDER — ENSURE MAX PROTEIN PO LIQD
11.0000 [oz_av] | Freq: Every day | ORAL | Status: DC
  Administered 2020-03-08 – 2020-03-09 (×2): 11 [oz_av] via ORAL
  Filled 2020-03-07 (×3): qty 330

## 2020-03-07 MED ORDER — BUPIVACAINE HCL 0.25 % IJ SOLN
INTRAMUSCULAR | Status: DC | PRN
  Administered 2020-03-07: 10 mL

## 2020-03-07 MED ORDER — BUPIVACAINE-EPINEPHRINE 0.25% -1:200000 IJ SOLN: INTRAMUSCULAR | Status: DC | PRN

## 2020-03-07 MED ORDER — ROCURONIUM BROMIDE 10 MG/ML (PF) SYRINGE
PREFILLED_SYRINGE | INTRAVENOUS | Status: DC | PRN
  Administered 2020-03-07: 80 mg via INTRAVENOUS

## 2020-03-07 MED ORDER — FENTANYL CITRATE (PF) 250 MCG/5ML IJ SOLN
INTRAMUSCULAR | Status: AC
Start: 2020-03-07 — End: ?
  Filled 2020-03-07: qty 5

## 2020-03-07 MED ORDER — PHENYLEPHRINE 40 MCG/ML (10ML) SYRINGE FOR IV PUSH (FOR BLOOD PRESSURE SUPPORT)
PREFILLED_SYRINGE | INTRAVENOUS | Status: DC | PRN
  Administered 2020-03-07: 120 ug via INTRAVENOUS
  Administered 2020-03-07: 80 ug via INTRAVENOUS

## 2020-03-07 MED ORDER — PROPOFOL 10 MG/ML IV BOLUS
INTRAVENOUS | Status: AC
  Filled 2020-03-07: qty 20

## 2020-03-07 MED ORDER — HYDROMORPHONE HCL 1 MG/ML IJ SOLN: 0.2500 mg | INTRAMUSCULAR | Status: DC | PRN

## 2020-03-07 MED ORDER — SODIUM CHLORIDE 0.9 % IV SOLN: INTRAVENOUS | Status: DC | PRN

## 2020-03-07 MED ORDER — LACTATED RINGERS IV SOLN: INTRAVENOUS | Status: AC

## 2020-03-07 SURGICAL SUPPLY — 35 items
BNDG GAUZE ELAST 4 BULKY (GAUZE/BANDAGES/DRESSINGS) ×1 IMPLANT
BRIEF STRETCH FOR OB PAD LRG (UNDERPADS AND DIAPERS) ×2 IMPLANT
CANISTER SUCT 3000ML PPV (MISCELLANEOUS) ×2 IMPLANT
COVER SURGICAL LIGHT HANDLE (MISCELLANEOUS) ×2 IMPLANT
COVER WAND RF STERILE (DRAPES) ×1 IMPLANT
DRSG PAD ABDOMINAL 8X10 ST (GAUZE/BANDAGES/DRESSINGS) ×2 IMPLANT
ELECT REM PT RETURN 9FT ADLT (ELECTROSURGICAL) ×2
ELECTRODE REM PT RTRN 9FT ADLT (ELECTROSURGICAL) ×1 IMPLANT
GAUZE SPONGE 4X4 12PLY STRL (GAUZE/BANDAGES/DRESSINGS) ×2 IMPLANT
GLOVE BIO SURGEON STRL SZ8 (GLOVE) ×2 IMPLANT
GLOVE BIOGEL PI IND STRL 8 (GLOVE) ×1 IMPLANT
GLOVE BIOGEL PI INDICATOR 8 (GLOVE) ×1
GOWN STRL REUS W/ TWL LRG LVL3 (GOWN DISPOSABLE) ×1 IMPLANT
GOWN STRL REUS W/ TWL XL LVL3 (GOWN DISPOSABLE) ×1 IMPLANT
GOWN STRL REUS W/TWL LRG LVL3 (GOWN DISPOSABLE) ×2
GOWN STRL REUS W/TWL XL LVL3 (GOWN DISPOSABLE) ×2
KIT BASIN OR (CUSTOM PROCEDURE TRAY) ×2 IMPLANT
KIT TURNOVER KIT B (KITS) ×2 IMPLANT
NDL HYPO 25GX1X1/2 BEV (NEEDLE) IMPLANT
NEEDLE HYPO 25GX1X1/2 BEV (NEEDLE) ×2 IMPLANT
NS IRRIG 1000ML POUR BTL (IV SOLUTION) ×2 IMPLANT
PACK LITHOTOMY IV (CUSTOM PROCEDURE TRAY) ×2 IMPLANT
PAD ABD 8X10 STRL (GAUZE/BANDAGES/DRESSINGS) ×1 IMPLANT
PAD ARMBOARD 7.5X6 YLW CONV (MISCELLANEOUS) ×2 IMPLANT
PENCIL SMOKE EVACUATOR (MISCELLANEOUS) ×2 IMPLANT
SPONGE LAP 18X18 RF (DISPOSABLE) ×3 IMPLANT
SWAB COLLECTION DEVICE MRSA (MISCELLANEOUS) ×1 IMPLANT
SWAB CULTURE ESWAB REG 1ML (MISCELLANEOUS) ×1 IMPLANT
SYR BULB IRRIG 60ML STRL (SYRINGE) ×2 IMPLANT
SYR CONTROL 10ML LL (SYRINGE) ×1 IMPLANT
TOWEL GREEN STERILE (TOWEL DISPOSABLE) ×2 IMPLANT
TOWEL GREEN STERILE FF (TOWEL DISPOSABLE) ×2 IMPLANT
TUBE CONNECTING 12X1/4 (SUCTIONS) ×2 IMPLANT
UNDERPAD 30X36 HEAVY ABSORB (UNDERPADS AND DIAPERS) ×2 IMPLANT
YANKAUER SUCT BULB TIP NO VENT (SUCTIONS) ×2 IMPLANT

## 2020-03-07 NOTE — Interval H&P Note (Signed)
History and Physical Interval Note:  03/07/2020 3:05 PM  [name]  has presented today for surgery, with the diagnosis of peri abscess.  The various methods of treatment have been discussed with the patient and family. After consideration of risks, benefits and other options for treatment, the patient has consented to  Procedure(s): IRRIGATION AND DEBRIDEMENT PERINEUM  AND PANNUS ABSCESS (N/A) as a surgical intervention.  The patient's history has been reviewed, patient examined, no change in status, stable for surgery.  I have reviewed the patient's chart and labs.  Questions were answered to the patient's satisfaction.     [place]

## 2020-03-07 NOTE — Anesthesia Postprocedure Evaluation (Signed)
Anesthesia Post Note  Patient: [name]  Procedure(s) Performed: IRRIGATION AND DEBRIDEMENT PERINEUM  AND PANNUS ABSCESS (N/A )     Patient location during evaluation: PACU Anesthesia Type: General Level of consciousness: awake and alert, patient cooperative and oriented Pain management: pain level controlled Vital Signs Assessment: post-procedure vital signs reviewed and stable Respiratory status: spontaneous breathing, nonlabored ventilation and respiratory function stable Cardiovascular status: blood pressure returned to baseline and stable Postop Assessment: no apparent nausea or vomiting Anesthetic complications: no   No complications documented.  Last Vitals:  Vitals:   03/07/20 1720 03/07/20 1747  BP: 129/60 (!) 124/58  Pulse: 71 70  Resp:    Temp:  36.4 C  SpO2:  99%    Last Pain:  Vitals:   03/07/20 1747  TempSrc:   PainSc: 0-No pain                 Tylasia Fletchall,E. Demontay Grantham

## 2020-03-07 NOTE — Progress Notes (Signed)
PROGRESS NOTE  [name] ZOX:[id number] DOB: 09-22-1950 DOA: 03/05/2020 PCP: [name], MD  HPI/Recap of past 24 hours: HPI from [name] [name] is a 69 y.o. 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 Pt in to ED with EMS after laying on floor all night following a fall at home.  Pt states she is in so much pain from multiple "abscesses in her groin area" that she fell.  Pt hasnt taken any meds in a week it seems.  CBG reading high. ED Course: WBC 31k, has cellulitis under abdomen and groin area, no drainable abscesses per EDP.  Also has UTI.  Creat 2.7 which is about baseline, BUN of 130 which is not. Bicarb of 16, anion gap of 16 and BHB of 1.98. BGL 674. Pt started on insulin gtt.  Given 1L LR bolus.  Patient admitted for further management.    Today, patient noted to be more alert, awake, oriented to self, place, still reports abdominal tenderness, but denies it worsening.  Denies any chest pain, shortness of breath, fever/chills, nausea/vomiting/diarrhea.     Assessment/Plan: Principal Problem:   Cellulitis Active Problems:   Controlled type 2 diabetes mellitus with circulatory disorder, without long-term current use of insulin (HCC)   Hypertension associated with diabetes ([place])   Chronic combined systolic and diastolic CHF (congestive heart failure) ([place])   CKD stage 4 due to type 2 diabetes mellitus ([place])   Hyperglycemia   Acute lower UTI   DKA, type 2 ([place])   Sepsis ([place])   Sepsis 2/2 perineal abscesses with widespread cellulitis of the pannus, right groin region On admission, noted leukocytosis, tachypnea, lactic acidosis Currently afebrile, with leukocytosis BC x2 NGTD Chest x-ray showed minimal left basilar opacity, likely atelectasis versus mild pneumonia CT abdomen/pelvis showed extensive soft tissue stranding and thickening extending across the lower anterior abdominal wall, concerning for  severe cellulitis and aggressive soft tissue infection General surgery consulted, plan for I&D with possible debridement on 03/07/2020 Continue vancomycin, Zosyn Continue IV fluids Monitor closely  DKA/DM type 2 A1c on 01/10/2020 was 7.2 S/p insulin drip, with improvement in CBG and closed anion gap, although still remains acidotic which could likely be from worsening kidney function/sepsis SSI, Lantus, Accu-Cheks, hypoglycemic protocol Monitor closely  Enterococcus faecalis UTI UA with moderate hemoglobin, large leukocytes, few bacteria, WBC 21-50 UC grew 60,000 E faecalis Continue vancomycin, Zosyn as above  Hypertension BP stable Continue to hold home Bystolic, amlodipine, hydralazine for now  CKD stage IV/acidosis Creatinine at baseline Continue gentle hydration due to sepsis Hold home torsemide for now Strict I's and O's Monitor closely  Anemia of chronic kidney disease Baseline hemoglobin 8-10 No evidence of bleeding Daily CBC  Chronic diastolic HF/history of ischemic cardiomyopathy History of stent placement Echo done on 05/2019 showed EF of 60 to 81%, grade 2 diastolic dysfunction Gentle hydration for now Monitor closely  Obesity Lifestyle modification advised        Malnutrition Type:      Malnutrition Characteristics:      Nutrition Interventions:       Estimated body mass index is 32.68 kg/m as calculated from the following:   Height as of this encounter: 5\' 4"  (1.626 m).   Weight as of this encounter: 86.4 kg.     Code Status: Full  Family Communication: Discussed with patient  Disposition Plan: Status is: Inpatient  Remains inpatient appropriate because:Inpatient level of care appropriate due to  severity of illness   Dispo: The patient is from: Home              Anticipated d/c is to: Home              Anticipated d/c date is: 3 days              Patient currently is not medically stable to d/c.    Consultants:  General  surgery  Procedures:  I&D on 03/07/2020  Antimicrobials:  Zosyn  Vancomycin  DVT prophylaxis: [place] Heparin   Objective: Vitals:   03/06/20 2010 03/06/20 2101 03/06/20 2339 03/07/20 0326  BP: 121/65 (!) 124/52 (!) 134/53 (!) 138/59  Pulse: 70 88 82 83  Resp: (!) 24 17 18 19   Temp: 98 F (36.7 C) 98.7 F (37.1 C) 98.4 F (36.9 C) 98.6 F (37 C)  TempSrc:  Oral Oral Oral  SpO2: 100% 100% 100% 100%  Weight:  86.4 kg    Height:  5\' 4"  (1.626 m)      Intake/Output Summary (Last 24 hours) at 03/07/2020 1408 Last data filed at 03/07/2020 0600 Gross per 24 hour  Intake 1017.95 ml  Output --  Net 1017.95 ml   Filed Weights   03/06/20 2101  Weight: 86.4 kg    Exam:  General: NAD, awake, alert, oriented  Cardiovascular: S1, S2 present  Respiratory: CTAB  Abdomen: Soft, +tender, nondistended, bowel sounds present  Musculoskeletal: No bilateral pedal edema noted  Skin:  Noted tenderness, induration around lower abdominal fold and groin area, noted a couple of abscesses around the perirectal area  Psychiatry: Normal mood    Data Reviewed: CBC: Recent Labs  Lab 03/05/20 1429 03/06/20 0319 03/06/20 0603 03/07/20 0414  WBC 31.7*  --  27.7* 27.8*  NEUTROABS  --   --   --  24.7*  HGB 8.6* 10.9* 8.1* 7.8*  HCT 28.5* 32.0* 26.6* 24.2*  MCV 93.1  --  91.4 88.0  PLT 193  --  192 440   Basic Metabolic Panel: Recent Labs  Lab 03/06/20 0605 03/06/20 1433 03/06/20 2216 03/07/20 0059 03/07/20 0414  NA 132* 139 139 138 140  K 4.6 4.1 4.3 4.7 4.3  CL 101 108 109 107 110  CO2 16* 18* 16* 18* 19*  GLUCOSE 444* 165* 256* 280* 282*  BUN 118* 100* 94* 99* 91*  CREATININE 2.49* 2.20* 2.15* 2.10* 2.17*  CALCIUM 9.1 9.2 9.1 8.9 9.0   [name]: Estimated Creatinine Clearance: 26 mL/min (A) (by C-G formula based on SCr of 2.17 mg/dL (H)). Liver Function Tests: No results for input(s): AST, ALT, ALKPHOS, BILITOT, PROT, ALBUMIN in the last 168 hours. No results for  input(s): LIPASE, AMYLASE in the last 168 hours. No results for input(s): AMMONIA in the last 168 hours. Coagulation Profile: No results for input(s): INR, PROTIME in the last 168 hours. Cardiac Enzymes: Recent Labs  Lab 03/05/20 1429  CKTOTAL 104   BNP (last 3 results) No results for input(s): PROBNP in the last 8760 hours. HbA1C: No results for input(s): HGBA1C in the last 72 hours. CBG: Recent Labs  Lab 03/06/20 1441 03/06/20 1558 03/06/20 2236 03/07/20 0643 03/07/20 1141  GLUCAP 161* 149* 262* 274* 346*   Lipid Profile: No results for input(s): CHOL, HDL, LDLCALC, TRIG, CHOLHDL, LDLDIRECT in the last 72 hours. Thyroid Function Tests: No results for input(s): TSH, T4TOTAL, FREET4, T3FREE, THYROIDAB in the last 72 hours. Anemia Panel: No results for input(s): VITAMINB12, FOLATE, FERRITIN, TIBC, IRON, RETICCTPCT  in the last 72 hours. Urine analysis:    Component Value Date/Time   COLORURINE YELLOW 03/05/2020 2040   APPEARANCEUR CLOUDY (A) 03/05/2020 2040   LABSPEC 1.010 03/05/2020 2040   PHURINE 5.0 03/05/2020 2040   GLUCOSEU >=500 (A) 03/05/2020 2040   GLUCOSEU NEGATIVE 10/03/2013 1004   HGBUR MODERATE (A) 03/05/2020 2040   BILIRUBINUR NEGATIVE 03/05/2020 2040   KETONESUR NEGATIVE 03/05/2020 2040   PROTEINUR NEGATIVE 03/05/2020 2040   UROBILINOGEN 0.2 10/03/2013 1004   NITRITE NEGATIVE 03/05/2020 2040   LEUKOCYTESUR LARGE (A) 03/05/2020 2040   Sepsis Labs: @LABRCNTIP (procalcitonin:4,lacticidven:4)  ) Recent Results (from the past 240 hour(s))  SARS Coronavirus 2 by RT PCR (hospital order, performed in [place] hospital lab) Nasopharyngeal Nasopharyngeal Swab     Status: None   Collection Time: 03/06/20  3:06 AM   Specimen: Nasopharyngeal Swab  Result Value Ref Range Status   SARS Coronavirus 2 NEGATIVE NEGATIVE Final    Comment: (NOTE) SARS-CoV-2 target nucleic acids are NOT DETECTED.  The SARS-CoV-2 RNA is generally detectable in upper and  lower respiratory specimens during the acute phase of infection. The lowest concentration of SARS-CoV-2 viral copies this assay can detect is 250 copies / mL. A negative result does not preclude SARS-CoV-2 infection and should not be used as the sole basis for treatment or other patient management decisions.  A negative result may occur with improper specimen collection / handling, submission of specimen other than nasopharyngeal swab, presence of viral mutation(s) within the areas targeted by this assay, and inadequate number of viral copies (<250 copies / mL). A negative result must be combined with clinical observations, patient history, and epidemiological information.  Fact Sheet for Patients:   StrictlyIdeas.no  Fact Sheet for Healthcare Providers: BankingDealers.co.za  This test is not yet approved or  cleared by the [place] FDA and has been authorized for detection and/or diagnosis of SARS-CoV-2 by FDA under an Emergency Use Authorization (EUA).  This EUA will remain in effect (meaning this test can be used) for the duration of the COVID-19 declaration under Section 564(b)(1) of the Act, 21 U.S.C. section 360bbb-3(b)(1), unless the authorization is terminated or revoked sooner.  Performed at [facility], [place] [address] [place], [place] [address]   Urine culture     Status: Abnormal (Preliminary result)   Collection Time: 03/06/20  4:50 AM   Specimen: Urine, Random  Result Value Ref Range Status   Specimen Description URINE, RANDOM  Final   Special Requests   Final    NONE Performed at [facility], [place] [address] [place], [place] [address]    Culture 60,000 COLONIES/mL ENTEROCOCCUS FAECALIS (A)  Final   Report Status PENDING  Incomplete  Culture, blood (routine x 2)     Status: None (Preliminary result)   Collection Time: 03/06/20  6:00 AM   Specimen: BLOOD RIGHT FOREARM  Result Value Ref Range  Status   Specimen Description BLOOD RIGHT FOREARM  Final   Special Requests   Final    BOTTLES DRAWN AEROBIC AND ANAEROBIC Blood Culture adequate volume   Culture   Final    NO GROWTH 1 DAY Performed at [facility], [place] [address], [place], [place] [address]    Report Status PENDING  Incomplete  Culture, blood (routine x 2)     Status: None (Preliminary result)   Collection Time: 03/06/20  8:33 AM   Specimen: BLOOD LEFT HAND  Result Value Ref Range Status  Specimen Description BLOOD LEFT HAND  Final   Special Requests   Final    BOTTLES DRAWN AEROBIC ONLY Blood Culture results may not be optimal due to an inadequate volume of blood received in culture bottles   Culture   Final    NO GROWTH < 24 HOURS Performed at [place] [address], [place], [place] [address]    Report Status PENDING  Incomplete  MRSA PCR Screening     Status: None   Collection Time: 03/06/20 10:07 PM   Specimen: Nasal Mucosa; Nasopharyngeal  Result Value Ref Range Status   MRSA by PCR NEGATIVE NEGATIVE Final    Comment:        The GeneXpert MRSA Assay (FDA approved for NASAL specimens only), is one component of a comprehensive MRSA colonization surveillance program. It is not intended to diagnose MRSA infection nor to guide or monitor treatment for MRSA infections. Performed at [facility], [place] [address] [place], [place] [address]       Studies: CT ABDOMEN PELVIS WO CONTRAST  Addendum Date: 03/06/2020   ADDENDUM REPORT: 03/06/2020 21:05 ADDENDUM: Critical Value/emergent results were called by telephone at the time of interpretation on 03/06/2020 at 9:05 pm to provider Sharlet Salina NP , who verbally acknowledged these results. Electronically Signed   By: Lovena Le M.D.   On: 03/06/2020 21:05   Result Date: 03/06/2020 CLINICAL DATA:  Abdominal pain, abdominal and groin cellulitis EXAM: CT ABDOMEN AND PELVIS WITHOUT CONTRAST TECHNIQUE: Multidetector CT imaging of the abdomen and  pelvis was performed following the standard protocol without IV contrast. COMPARISON:  None. FINDINGS: Lower chest: Lung bases are clear. Cardiac size at the upper limits normal. Coronary artery calcifications are present. Suspect calcification of the mitral annulus as well. Hypoattenuation of the cardiac blood pool may reflect a mild anemia. Hepatobiliary: No visible concerning liver lesion within the limitations of this unenhanced of motion degraded CT. Smooth liver surface contour. Normal liver attenuation. Gallbladder is unremarkable. Question some biliary ductal dilatation difficult to fully discern given the lack of contrast media and extensive motion artifact. The extrahepatic common bile duct may measure up to 1.5 cm at the level of the pancreatic head. No visible intraductal gallstones. Pancreas: Poorly assessed given extensive motion artifact photon starvation. No pancreatic ductal dilatation or surrounding inflammatory changes. Spleen: Normal in size. No concerning splenic lesions. Splenic hilar vascular calcifications are noted. Adrenals/Urinary Tract: Adrenal glands are unremarkable. Kidneys are symmetric in size and normally located. Question some mild perinephric stranding versus volume averaging in the setting of motion artifact. No visible concerning renal lesion. No urolithiasis or hydronephrosis. Urinary bladder is unremarkable. Stomach/Bowel: Small sliding-type hiatal hernia. Distal stomach and duodenum are unremarkable. No small or large bowel thickening or dilatation. No evidence of bowel obstruction. Appendix is not visualized. No focal inflammation the vicinity of the cecum to suggest an occult appendicitis. Vascular/Lymphatic: Atherosclerotic calcifications within the abdominal aorta and branch vessels. No aneurysm or ectasia. No enlarged abdominopelvic lymph nodes. Likely reactive adenopathy seen in the inguinal regions. Reproductive: Anteverted uterus. Question endometrial thickening,  greater than expected for patient age measuring up to 10 mm in struck thickness. No concerning adnexal lesions. Few parametrial calcifications are atypical senescent finding Other: There is extensive soft tissue stranding and thickening extending across the low anterior abdominal wall/panniculus. Large lobular phlegmon in the right groin/pudendal tissues superficial to the right adductor musculature and right inferior pubic ramus anteriorly with small amount of soft tissue gas. Is  region is difficult to accurately measure given the lobular margins of med does extend up to 4.3 x 6.2 cm in maximal transaxial dimensions. Organized collection/abscess formation is difficult to ascertain in the absence of contrast media. Musculoskeletal: Soft tissue thickening and edematous changes of the adductor compartment of the right thigh could reflect some reactive or inflammatory myositis. No discernible intramuscular collection is seen. No acute or suspicious osseous lesions. Multilevel degenerative changes in the spine, hips and pelvis. Mild grade 1 anterolisthesis L4 on 5. No spondylolysis is evident. IMPRESSION: 1. Extensive soft tissue stranding and thickening extending across the low anterior abdominal wall/panniculus with a large, heterogeneous phlegmon within the deep space of the right groin and pudendal soft tissues along the right adductor musculature and right inferior pubic ramus with small amount of soft tissue gas. Organized collection/abscess formation is difficult to ascertain in the absence of contrast media. Findings are concerning for severe cellulitis and aggressive soft tissue infection. Emergent surgical consultation is warranted. 2. Soft tissue thickening and edematous changes of the adductor compartment of the right thigh could reflect some reactive or inflammatory myositis. No discernible intramuscular collection is seen. 3. Question endometrial thickening, greater than expected for patient age measuring  up to 10 mm in thickness. Recommend further evaluation with outpatient pelvic ultrasound when patient is able to better tolerate. 4. Aortic Atherosclerosis (ICD10-I70.0). Currently attempting to contact the ordering provider with a critical value result. Addendum will be submitted upon case discussion. Electronically Signed: By: Lovena Le M.D. On: 03/06/2020 21:02   CT HEAD WO CONTRAST  Result Date: 03/06/2020 CLINICAL DATA:  Mental status change and cause EXAM: CT HEAD WITHOUT CONTRAST TECHNIQUE: Contiguous axial images were obtained from the base of the skull through the vertex without intravenous contrast. COMPARISON:  CT head 06/06/2013 FINDINGS: Brain: No evidence of acute infarction, hemorrhage, hydrocephalus, extra-axial collection or mass lesion/mass effect. Symmetric prominence of the ventricles, cisterns and sulci compatible with parenchymal volume loss. Patchy areas of white matter hypoattenuation are most compatible with chronic microvascular angiopathy. Vascular: Atherosclerotic calcification of the carotid siphons0 and left intradural vertebral artery. No worrisome hyperdense vessel. Skull: No scalp swelling or calvarial fracture. No acute or suspicious osseous lesions Sinuses/Orbits: Minimal thickening in the ethmoid air cells. Remaining paranasal sinuses and slightly hypo pneumatized mastoid air cells are predominantly clear. Included orbital structures are unremarkable. Other: Debris noted in the left external auditory canal. IMPRESSION: 1. No acute intracranial findings. 2. Mild parenchymal volume loss and chronic microvascular angiopathy. 3. Debris in the left external auditory canal, correlate for cerumen impaction. Electronically Signed   By: Lovena Le M.D.   On: 03/06/2020 20:46   DG Chest Port 1 View  Result Date: 03/06/2020 CLINICAL DATA:  Diabetic ketoacidosis EXAM: PORTABLE CHEST 1 VIEW COMPARISON:  04/13/2016 FINDINGS: Minimal left basilar opacity. No pleural effusion. Stable  cardiomediastinal silhouette with aortic atherosclerosis. No pneumothorax. IMPRESSION: Minimal left basilar opacity, atelectasis versus mild pneumonia. Electronically Signed   By: Donavan Foil M.D.   On: 03/06/2020 17:28    Scheduled Meds: . aspirin  81 mg Oral Daily  . calcium carbonate  1,250 mg Oral BID WC  . Chlorhexidine Gluconate Cloth  6 each Topical Once  . heparin  5,000 Units Subcutaneous Q8H  . insulin aspart  0-15 Units Subcutaneous TID WC  . insulin aspart  0-5 Units Subcutaneous QHS  . [START ON 03/08/2020] insulin glargine  15 Units Subcutaneous Daily  . pantoprazole  20 mg Oral Daily  . rosuvastatin  5 mg Oral Daily    Continuous Infusions: . lactated ringers 75 mL/hr at 03/07/20 0939  . piperacillin-tazobactam (ZOSYN)  IV 3.375 g (03/07/20 0612)  . vancomycin 1,000 mg (03/07/20 0043)     LOS: 1 day     Alma Friendly, MD [facility]  If 7PM-7AM, please contact night-coverage [email or web address] 03/07/2020, 2:08 PM

## 2020-03-07 NOTE — Op Note (Signed)
Preoperative diagnosis: Right perineal abscess  Postop diagnosis: Right perineal abscess complex multiloculated  Procedure: Incision drainage right perineal abscess complex  Surgeon: Erroll Luna, MD  Anesthesia: General with 10 cc of Marcaine plain quarter percent  Drains: None  EBL: 30 cc  IV fluids: Per anesthesia record  Indication for procedure: Patient presents for an infection of her perineum after admission last night.  She presents to the OR for incision and drainage of multiright perineal abscesses failed CT scan and exam.The procedure has been discussed with the patient.  Alternative therapies have been discussed with the patient.  Operative risks include bleeding,  Infection,  Organ injury,  Nerve injury,  Blood vessel injury,  DVT,  Pulmonary embolism,  Death,  And possible reoperation.  Medical management risks include worsening of present situation.  The success of the procedure is 50 -90 % at treating patients symptoms.  The patient understands and agrees to proceed.     Description of procedure: Patient was met in the holding area and questions answered.  She was examined and the areas mostly in the right perineum but did extend over to the left somewhat.  We discussed opening these open packing them.  Risks and benefits discussed as well as potential complications.  She agreed to proceed.  She was brought that room.  She is placed supine initially where general anesthesia was initiated.  She was then placed in lithotomy.  Perineum was prepped and draped in sterile fashion after induction of general esthesia.  Timeout was done.  Care was taken to pad her appropriately with no undue tension or hips knees or ankles.  Most of her disease involve the right perineum and right anterior thigh.  After sterile prep and drape local anesthetic was infiltrated in these 3 areas.  They are open with a scalpel.  The anterior area was on the anterior thigh had measured about 8 x 10 cm.  It  is about 4 cm deep.  This debrided back to healthy tissue.  There are 2 other abscesses, the right buttock 1 was 4 x 6 one was 8 x 10 these were opened and packed as well.  All dead tissue was debrided and the wounds were all irrigated.  Hemostasis achieved.  Wound packed with saline soaked Kerlix.  Dry dressings applied.  Patient was removed from lithotomy.  She was extubated taken recovery in satisfactory condition.

## 2020-03-07 NOTE — Plan of Care (Signed)

## 2020-03-07 NOTE — Transfer of Care (Signed)
Immediate Anesthesia Transfer of Care Note  Patient: [name]  Procedure(s) Performed: IRRIGATION AND DEBRIDEMENT PERINEUM  AND PANNUS ABSCESS (N/A )  Patient Location: PACU  Anesthesia Type:General  Level of Consciousness: awake and patient cooperative  Airway & Oxygen Therapy: Patient Spontanous Breathing and Patient connected to face mask oxygen  Post-op Assessment: Report given to RN and Post -op Vital signs reviewed and stable  Post vital signs: Reviewed and stable  Last Vitals:  Vitals Value Taken Time  BP 109/53 03/07/20 1636  Temp 36.7 C 03/07/20 1635  Pulse 73 03/07/20 1635  Resp 18 03/07/20 1641  SpO2 100 % 03/07/20 1635  Vitals shown include unvalidated device data.  Last Pain:  Vitals:   03/07/20 1635  TempSrc:   PainSc: 6       Patients Stated Pain Goal: 7 (15/04/13 6438)  Complications: No complications documented.

## 2020-03-07 NOTE — Progress Notes (Signed)
Pt seen examined and agree with Dr Georgette Dover NOTE Plan for OR today for incision and drainage of perineal / abdominal region infection with possible debridement The procedure has been discussed with the patient.  Alternative therapies have been discussed with the patient.  Operative risks include bleeding,  Infection,  Organ injury,  Nerve injury,  Blood vessel injury,  DVT,  Pulmonary embolism,  Death,  And possible reoperation.  Medical management risks include worsening of present situation.  The success of the procedure is 50 -90 % at treating patients symptoms.  The patient understands and agrees to proceed.

## 2020-03-07 NOTE — Progress Notes (Addendum)
NCM received consult : Pt states she lives alone. No record of cognitive issues anywhere on her chart. Oriented to self only. NCM spoke with pt @ bedside. Pt alert and oriented to person and place. Pt states lives alone in [place]. States has no children. States has a cousin who lives in [place], [place]. [place]. Telephone # given for [facility] by pt , [phone]. NCM called # and left voice message .[name]..awaiting call return. TOC team will continue to monitor and follow. Whitman Hero RN,BSN,CM [phone]

## 2020-03-07 NOTE — Progress Notes (Signed)
Initial Nutrition Assessment  DOCUMENTATION CODES:   Obesity unspecified  INTERVENTION:   Once diet is advanced:  -1 packet Juven BID, each packet provides 95 calories, 2.5 grams of protein (collagen), and 9.8 grams of carbohydrate (3 grams sugar); also contains 7 grams of L-arginine and L-glutamine, 300 mg vitamin C, 15 mg vitamin E, 1.2 mcg vitamin B-12, 9.5 mg zinc, 200 mg calcium, and 1.5 g  Calcium Beta-hydroxy-Beta-methylbutyrate to support wound healing -Ensure Max po daily, each supplement provides 150 kcal and 30 grams of protein -MVI with minerals daily  NUTRITION DIAGNOSIS:   Increased nutrient needs related to post-op healing as evidenced by estimated needs.  GOAL:   Patient will meet greater than or equal to 90% of their needs  MONITOR:   PO intake, Supplement acceptance, Diet advancement, Labs, Weight trends, Skin, I & O's  REASON FOR ASSESSMENT:   Malnutrition Screening Tool    ASSESSMENT:   [name] is a 69 y.o. 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  Pt admitted with cellulitis of abdomen and UTI.   Reviewed I/O's: +1 L x 24 hours and +2.1 L since admission  Pt currently NPO for wound exploration today.   Case discussed with RN, who confirmed plans for surgery today.   Spoke with pt at bedside, who was minimally interactive with this RD. She is very anxious about surgery. She shares that she was eating poorly PTA but did not go into specifics despite probing. She reports the last time she ate in a while was yesterday. She endorses wt loss, however, did not give specifics.   Pt understanding of NPO order. Discussed importance of good meal and supplement intake to promote healing.   Medications reviewed and include lactated ringers infusion @ 75 ml/hr.   Lab Results  Component Value Date   HGBA1C 7.2 (A) 01/10/2020   PTA DM medications are 5 mg glipizide daily.   Labs reviewed: CBGS: 262 (inpatient  orders for glycemic control are 0-5 units insulin aspart daily at bedtime, 0-9 units insulin aspart TID with meals, and 10 units insulin glargine daily).   NUTRITION - FOCUSED PHYSICAL EXAM:    Most Recent Value  Orbital Region No depletion  Upper Arm Region No depletion  Thoracic and Lumbar Region No depletion  Buccal Region No depletion  Temple Region No depletion  Clavicle Bone Region No depletion  Clavicle and Acromion Bone Region No depletion  Scapular Bone Region No depletion  Dorsal Hand No depletion  Patellar Region No depletion  Anterior Thigh Region No depletion  Posterior Calf Region No depletion  Edema (RD Assessment) Mild  Hair Reviewed  Eyes Reviewed  Mouth Reviewed  Skin Reviewed  Nails Reviewed       Diet Order:   Diet Order            Diet NPO time specified  Diet effective midnight           Diet NPO time specified  Diet effective now                 EDUCATION NEEDS:   Education needs have been addressed  Skin:  Skin Assessment: Skin Integrity Issues: Skin Integrity Issues:: Other (Comment) Other: non pressure wound to groin  Last BM:  Unknown  Height:   Ht Readings from Last 1 Encounters:  03/06/20 5\' 4"  (1.626 m)    Weight:   Wt Readings from Last 1 Encounters:  03/06/20 86.4  kg    Ideal Body Weight:  54.5 kg  BMI:  Body mass index is 32.68 kg/m.  Estimated Nutritional Needs:   Kcal:  1700-1900  Protein:  90-105 grams  Fluid:  > 1.7 L    Loistine Chance, RD, LDN, [place] Registered Dietitian II Certified Diabetes Care and Education Specialist Please refer to [facility] for RD and/or RD on-call/weekend/after hours pager

## 2020-03-07 NOTE — Progress Notes (Addendum)
Pt arrived to unit.  Assessed, VS taken, pt stable in no acute distress, per provider pt to be NPO.    Pt oriented to person only.  She does not respond when asked where she is, and appears surprised when told she is in the hospital.  She states the year is 24.    She provides different reasons to different staff for being here, including blood sugar of 66, abscesses in groin, fall d/t pain from groin, then denies any falls.     Incorrect time on note.  Pt arrived to unit at 2100.

## 2020-03-07 NOTE — ED Provider Notes (Signed)
[facility] Provider Note   CSN: [id number] Arrival date & time: 03/05/20  1417     History Chief Complaint  Patient presents with  . Fall  . Hyperglycemia    [name] is a 69 y.o. female.  Recently discharged from the hospital for UTI and had finished treatment. Apparently patient was weak again today and had a fall without any traumatic injuries. She states she also has some abdominal warmth and tenderness that she thinks is likely a boil or abscess. She states that her blood sugars been high as well. No cough.   Fall This is a recurrent problem. The current episode started yesterday. The problem occurs constantly. The problem has not changed since onset.Associated symptoms include abdominal pain ( Superficial). Pertinent negatives include no chest pain, no headaches and no shortness of breath. Nothing aggravates the symptoms. Nothing relieves the symptoms. She has tried nothing for the symptoms.  Hyperglycemia Associated symptoms: abdominal pain ( Superficial)   Associated symptoms: no chest pain and no shortness of breath        Past Medical History:  Diagnosis Date  . Anemia   . COPD (chronic obstructive pulmonary disease) ([place])   . Diabetes mellitus   . Hyperlipidemia   . Hypertension   . Ischemic cardiomyopathy 06/11/2013  . Low back pain   . NSTEMI (non-ST elevated myocardial infarction) ([place]) 06/11/2013  . Obesity   . Personal history of colon cancer   . Renal insufficiency     Patient Active Problem List   Diagnosis Date Noted  . Hyperglycemia 03/06/2020  . Acute lower UTI 03/06/2020  . Cellulitis 03/06/2020  . DKA, type 2 ([place]) 03/06/2020  . Sepsis ([place]) 03/06/2020  . Vaginal cysts 08/03/2018  . Hypertensive heart disease with congestive heart failure ([place]) 05/11/2018  . Boils 04/27/2018  . Edema 03/17/2017  . Rash and nonspecific skin eruption 12/08/2016  . CKD stage 4 due to type 2 diabetes mellitus  ([place]) 09/24/2016  . Cough 04/13/2016  . Left shoulder pain 09/16/2015  . Allergic rhinitis 09/16/2015  . Goiter 10/19/2014  . Low back pain 11/13/2013  . Dizziness 11/13/2013  . Pruritus 08/01/2013  . Well adult exam 08/01/2013  . GERD (gastroesophageal reflux disease) 08/01/2013  . CAD (coronary artery disease) 06/11/2013  . Chronic combined systolic and diastolic CHF (congestive heart failure) ([place]) 06/11/2013  . Hypertension associated with diabetes ([place]) 06/07/2013  . Personal history of colon cancer   . Hyperlipidemia   . Obesity   . Disorder resulting from impaired renal function 12/19/2008  . LEG PAIN, BILATERAL 09/11/2008  . Acute sinusitis 06/13/2008  . COPD mixed type ([place]) 02/09/2008  . Controlled type 2 diabetes mellitus with circulatory disorder, without long-term current use of insulin ([place]) 10/13/2007  . Iron deficiency anemia 10/13/2007  . TOBACCO USE DISORDER/SMOKER-SMOKING CESSATION DISCUSSED 10/13/2007  . WEIGHT LOSS, ABNORMAL 10/13/2007  . History of malignant neoplasm of large intestine 10/13/2007    Past Surgical History:  Procedure Laterality Date  . COLECTOMY    . LEFT HEART CATHETERIZATION WITH CORONARY ANGIOGRAM N/A 06/09/2013   Procedure: LEFT HEART CATHETERIZATION WITH CORONARY ANGIOGRAM;  Surgeon: Ramond Dial, MD;  [facility];  Service: Cardiovascular;  Laterality: N/A;     OB History    Gravida  0   Para  0   Term  0   Preterm  0   AB  0   Living  0  SAB  0   TAB  0   Ectopic  0   Multiple  0   Live Births  0           Family History  Problem Relation Age of Onset  . Hypertension Other     Social History   Tobacco Use  . Smoking status: Light Tobacco Smoker    Types: Cigarettes  . Smokeless tobacco: Never Used  Vaping Use  . Vaping Use: Never used  Substance Use Topics  . Alcohol use: No  . Drug use: No    Home Medications Prior to Admission medications   Medication Sig Start Date End  Date Taking? Authorizing Provider  amLODipine (NORVASC) 10 MG tablet TAKE ONE-HALF TABLET BY  MOUTH DAILY Patient taking differently: Take 5 mg by mouth daily.  12/19/19  Yes [name], Evie Lacks, MD  aspirin 81 MG chewable tablet Chew 1 tablet (81 mg total) by mouth daily. 06/11/13  Yes Erick Colace, NP  BYSTOLIC 10 MG tablet TAKE 1 TABLET BY MOUTH EVERY DAY Patient taking differently: Take 10 mg by mouth daily.  12/18/19  Yes [name], Evie Lacks, MD  calcium carbonate (OS-CAL) 600 MG TABS tablet Take 600 mg by mouth 2 (two) times daily with a meal.   Yes [provider]  Cholecalciferol (VITAMIN D3) 50 MCG (2000 UT) capsule Take 1 capsule (2,000 Units total) by mouth daily. 09/21/19  Yes [name], Evie Lacks, MD  glipiZIDE (GLUCOTROL XL) 5 MG 24 hr tablet TAKE 1 TABLET BY MOUTH  DAILY WITH BREAKFAST Patient taking differently: Take 5 mg by mouth daily with breakfast.  12/19/19  Yes Philemon Kingdom, MD  hydrALAZINE (APRESOLINE) 25 MG tablet TAKE 1 TABLET BY MOUTH THREE TIMES A DAY Patient taking differently: Take 25 mg by mouth 3 (three) times daily.  12/19/19  Yes Nahser, Wonda Cheng, MD  hydrOXYzine (ATARAX/VISTARIL) 25 MG tablet Take 1-2 tablets (25-50 mg total) by mouth every 8 (eight) hours as needed for itching. 12/08/16  Yes [name], Evie Lacks, MD  lansoprazole (PREVACID) 15 MG capsule Take 1 capsule (15 mg total) by mouth daily at 12 noon. Patient taking differently: Take 15 mg by mouth daily as needed (acid reflux).  01/10/19  Yes [name], Evie Lacks, MD  nitroGLYCERIN (NITROSTAT) 0.4 MG SL tablet DISSOLVE 1 TABLET UNDER THE TONGUE EVERY 5 MINUTES AS  NEEDED FOR CHEST PAIN Patient taking differently: Place 0.4 mg under the tongue every 5 (five) minutes as needed for chest pain. DISSOLVE 1 TABLET UNDER THE TONGUE EVERY 5 MINUTES AS  NEEDED FOR CHEST PAIN 05/16/19  Yes [name], Evie Lacks, MD  ondansetron (ZOFRAN) 4 MG tablet Take 1 tablet (4 mg total) by mouth every 8 (eight) hours  as needed for nausea or vomiting. 03/28/19  Yes [name], Evie Lacks, MD  potassium chloride SA (KLOR-CON) 20 MEQ tablet TAKE 1 TABLET BY MOUTH  DAILY Patient taking differently: Take 20 mEq by mouth daily as needed (cramping).  12/19/19  Yes [name], Evie Lacks, MD  rosuvastatin (CRESTOR) 5 MG tablet TAKE 1 TABLET BY MOUTH  DAILY AT 6PM Patient taking differently: Take 5 mg by mouth daily.  02/27/19  Yes [name], Evie Lacks, MD  torsemide (DEMADEX) 100 MG tablet TAKE 0.5-1 TABLETS (50-100 MG TOTAL) BY MOUTH DAILY. Patient taking differently: Take 50 mg by mouth daily.  12/18/19  Yes [name], Evie Lacks, MD  vitamin B-12 (CYANOCOBALAMIN) 1000 MCG tablet Take 1,000 mcg by mouth daily.   Yes [provider]  glucose blood (ONETOUCH ULTRA) test strip USE TO TEST BLOOD SUGAR TWO TIMES A DAY AS DIRECTED 01/10/19   [name], Evie Lacks, MD    Allergies    Tradjenta [linagliptin], Atorvastatin, Enalapril maleate, Farxiga [dapagliflozin], Hydrochlorothiazide, Kenalog [triamcinolone acetonide], Metformin and related, Propoxyphene n-acetaminophen, Simvastatin, and Spironolactone  Review of Systems   Review of Systems  Respiratory: Negative for shortness of breath.   Cardiovascular: Negative for chest pain.  Gastrointestinal: Positive for abdominal pain ( Superficial).  Neurological: Negative for headaches.  All other systems reviewed and are negative.   Physical Exam Updated Vital Signs BP (!) 134/53 (BP Location: Left Arm)   Pulse 82   Temp 98.4 F (36.9 C) (Oral)   Resp 18   Ht 5\' 4"  (1.626 m)   Wt 86.4 kg   SpO2 100%   BMI 32.68 kg/m   Physical Exam Vitals and nursing note reviewed.  Constitutional:      Appearance: She is well-developed.  HENT:     Head: Normocephalic and atraumatic.     Mouth/Throat:     Mouth: Mucous membranes are moist.     Pharynx: Oropharynx is clear.  Eyes:     Conjunctiva/sclera: Conjunctivae normal.     Pupils: Pupils are equal, round, and  reactive to light.  Cardiovascular:     Rate and Rhythm: Normal rate and regular rhythm.  Pulmonary:     Effort: No respiratory distress.     Breath sounds: No stridor.  Abdominal:     General: Abdomen is flat. There is no distension.  Musculoskeletal:        General: No swelling or tenderness. Normal range of motion.     Cervical back: Normal range of motion.  Skin:    General: Skin is warm.     Findings: Erythema ( Associated with induration, warmth and tenderness around her waistline. No fluctuance to suggest abscess.) present.  Neurological:     General: No focal deficit present.     Mental Status: She is alert.     ED Results / Procedures / Treatments   Labs (all labs ordered are listed, but only abnormal results are displayed) Labs Reviewed  BASIC METABOLIC PANEL - Abnormal; Notable for the following components:      Result Value   Sodium 129 (*)    CO2 16 (*)    Glucose, Bld 674 (*)    BUN 119 (*)    Creatinine, Ser 2.78 (*)    [name] calc non Af Amer 17 (*)    [name] calc Af Amer 19 (*)    All other components within normal limits  CBC - Abnormal; Notable for the following components:   WBC 31.7 (*)    RBC 3.06 (*)    Hemoglobin 8.6 (*)    HCT 28.5 (*)    RDW 15.8 (*)    All other components within normal limits  URINALYSIS, ROUTINE W REFLEX MICROSCOPIC - Abnormal; Notable for the following components:   APPearance CLOUDY (*)    Glucose, UA >=500 (*)    Hgb urine dipstick MODERATE (*)    Leukocytes,Ua LARGE (*)    Bacteria, UA FEW (*)    All other components within normal limits  BASIC METABOLIC PANEL - Abnormal; Notable for the following components:   Sodium 127 (*)    Potassium 5.2 (*)    Chloride 95 (*)    CO2 16 (*)    Glucose, Bld 657 (*)    BUN 130 (*)  Creatinine, Ser 2.79 (*)    [name] calc non Af Amer 17 (*)    [name] calc Af Amer 19 (*)    Anion gap 16 (*)    All other components within normal limits  BETA-HYDROXYBUTYRIC ACID - Abnormal; Notable for  the following components:   Beta-Hydroxybutyric Acid 1.98 (*)    All other components within normal limits  CBC - Abnormal; Notable for the following components:   WBC 27.7 (*)    RBC 2.91 (*)    Hemoglobin 8.1 (*)    HCT 26.6 (*)    RDW 15.7 (*)    All other components within normal limits  BASIC METABOLIC PANEL - Abnormal; Notable for the following components:   Sodium 132 (*)    CO2 16 (*)    Glucose, Bld 444 (*)    BUN 118 (*)    Creatinine, Ser 2.49 (*)    [name] calc non Af Amer 19 (*)    [name] calc Af Amer 22 (*)    All other components within normal limits  BASIC METABOLIC PANEL - Abnormal; Notable for the following components:   CO2 18 (*)    Glucose, Bld 280 (*)    BUN 99 (*)    Creatinine, Ser 2.10 (*)    [name] calc non Af Amer 23 (*)    [name] calc Af Amer 27 (*)    All other components within normal limits  BASIC METABOLIC PANEL - Abnormal; Notable for the following components:   CO2 18 (*)    Glucose, Bld 165 (*)    BUN 100 (*)    Creatinine, Ser 2.20 (*)    [name] calc non Af Amer 22 (*)    [name] calc Af Amer 26 (*)    All other components within normal limits  BASIC METABOLIC PANEL - Abnormal; Notable for the following components:   CO2 16 (*)    Glucose, Bld 256 (*)    BUN 94 (*)    Creatinine, Ser 2.15 (*)    [name] calc non Af Amer 23 (*)    [name] calc Af Amer 26 (*)    All other components within normal limits  LACTIC ACID, PLASMA - Abnormal; Notable for the following components:   Lactic Acid, Venous 2.9 (*)    All other components within normal limits  LACTIC ACID, PLASMA - Abnormal; Notable for the following components:   Lactic Acid, Venous 2.2 (*)    All other components within normal limits  LACTIC ACID, PLASMA - Abnormal; Notable for the following components:   Lactic Acid, Venous 2.0 (*)    All other components within normal limits  GLUCOSE, CAPILLARY - Abnormal; Notable for the following components:   Glucose-Capillary 262 (*)    All other components  within normal limits  CBG MONITORING, ED - Abnormal; Notable for the following components:   Glucose-Capillary 547 (*)    All other components within normal limits  CBG MONITORING, ED - Abnormal; Notable for the following components:   Glucose-Capillary >600 (*)    All other components within normal limits  CBG MONITORING, ED - Abnormal; Notable for the following components:   Glucose-Capillary 377 (*)    All other components within normal limits  I-STAT VENOUS BLOOD GAS, ED - Abnormal; Notable for the following components:   pCO2, Ven 32.5 (*)    pO2, Ven 26.0 (*)    Bicarbonate 17.5 (*)    TCO2 19 (*)    Acid-base deficit 7.0 (*)  Sodium 133 (*)    Potassium 5.3 (*)    HCT 32.0 (*)    Hemoglobin 10.9 (*)    All other components within normal limits  CBG MONITORING, ED - Abnormal; Notable for the following components:   Glucose-Capillary 437 (*)    All other components within normal limits  CBG MONITORING, ED - Abnormal; Notable for the following components:   Glucose-Capillary 427 (*)    All other components within normal limits  CBG MONITORING, ED - Abnormal; Notable for the following components:   Glucose-Capillary 442 (*)    All other components within normal limits  CBG MONITORING, ED - Abnormal; Notable for the following components:   Glucose-Capillary 454 (*)    All other components within normal limits  CBG MONITORING, ED - Abnormal; Notable for the following components:   Glucose-Capillary 411 (*)    All other components within normal limits  CBG MONITORING, ED - Abnormal; Notable for the following components:   Glucose-Capillary 371 (*)    All other components within normal limits  CBG MONITORING, ED - Abnormal; Notable for the following components:   Glucose-Capillary 210 (*)    All other components within normal limits  CBG MONITORING, ED - Abnormal; Notable for the following components:   Glucose-Capillary 146 (*)    All other components within normal  limits  CBG MONITORING, ED - Abnormal; Notable for the following components:   Glucose-Capillary 161 (*)    All other components within normal limits  CBG MONITORING, ED - Abnormal; Notable for the following components:   Glucose-Capillary 149 (*)    All other components within normal limits  SARS CORONAVIRUS 2 BY RT PCR (HOSPITAL ORDER, [facility])  MRSA PCR SCREENING  URINE CULTURE  CULTURE, BLOOD (ROUTINE X 2)  CULTURE, BLOOD (ROUTINE X 2)  CK  HIV ANTIBODY (ROUTINE TESTING W REFLEX)  BASIC METABOLIC PANEL  CBC WITH DIFFERENTIAL/PLATELET  LACTIC ACID, PLASMA    EKG None  Radiology CT ABDOMEN PELVIS WO CONTRAST  Addendum Date: 03/06/2020   ADDENDUM REPORT: 03/06/2020 21:05 ADDENDUM: Critical Value/emergent results were called by telephone at the time of interpretation on 03/06/2020 at 9:05 pm to provider Sharlet Salina NP , who verbally acknowledged these results. Electronically Signed   By: Lovena Le M.D.   On: 03/06/2020 21:05   Result Date: 03/06/2020 CLINICAL DATA:  Abdominal pain, abdominal and groin cellulitis EXAM: CT ABDOMEN AND PELVIS WITHOUT CONTRAST TECHNIQUE: Multidetector CT imaging of the abdomen and pelvis was performed following the standard protocol without IV contrast. COMPARISON:  None. FINDINGS: Lower chest: Lung bases are clear. Cardiac size at the upper limits normal. Coronary artery calcifications are present. Suspect calcification of the mitral annulus as well. Hypoattenuation of the cardiac blood pool may reflect a mild anemia. Hepatobiliary: No visible concerning liver lesion within the limitations of this unenhanced of motion degraded CT. Smooth liver surface contour. Normal liver attenuation. Gallbladder is unremarkable. Question some biliary ductal dilatation difficult to fully discern given the lack of contrast media and extensive motion artifact. The extrahepatic common bile duct may measure up to 1.5 cm at the level of the pancreatic head.  No visible intraductal gallstones. Pancreas: Poorly assessed given extensive motion artifact photon starvation. No pancreatic ductal dilatation or surrounding inflammatory changes. Spleen: Normal in size. No concerning splenic lesions. Splenic hilar vascular calcifications are noted. Adrenals/Urinary Tract: Adrenal glands are unremarkable. Kidneys are symmetric in size and normally located. Question some mild perinephric stranding versus volume  averaging in the setting of motion artifact. No visible concerning renal lesion. No urolithiasis or hydronephrosis. Urinary bladder is unremarkable. Stomach/Bowel: Small sliding-type hiatal hernia. Distal stomach and duodenum are unremarkable. No small or large bowel thickening or dilatation. No evidence of bowel obstruction. Appendix is not visualized. No focal inflammation the vicinity of the cecum to suggest an occult appendicitis. Vascular/Lymphatic: Atherosclerotic calcifications within the abdominal aorta and branch vessels. No aneurysm or ectasia. No enlarged abdominopelvic lymph nodes. Likely reactive adenopathy seen in the inguinal regions. Reproductive: Anteverted uterus. Question endometrial thickening, greater than expected for patient age measuring up to 10 mm in struck thickness. No concerning adnexal lesions. Few parametrial calcifications are atypical senescent finding Other: There is extensive soft tissue stranding and thickening extending across the low anterior abdominal wall/panniculus. Large lobular phlegmon in the right groin/pudendal tissues superficial to the right adductor musculature and right inferior pubic ramus anteriorly with small amount of soft tissue gas. Is region is difficult to accurately measure given the lobular margins of med does extend up to 4.3 x 6.2 cm in maximal transaxial dimensions. Organized collection/abscess formation is difficult to ascertain in the absence of contrast media. Musculoskeletal: Soft tissue thickening and  edematous changes of the adductor compartment of the right thigh could reflect some reactive or inflammatory myositis. No discernible intramuscular collection is seen. No acute or suspicious osseous lesions. Multilevel degenerative changes in the spine, hips and pelvis. Mild grade 1 anterolisthesis L4 on 5. No spondylolysis is evident. IMPRESSION: 1. Extensive soft tissue stranding and thickening extending across the low anterior abdominal wall/panniculus with a large, heterogeneous phlegmon within the deep space of the right groin and pudendal soft tissues along the right adductor musculature and right inferior pubic ramus with small amount of soft tissue gas. Organized collection/abscess formation is difficult to ascertain in the absence of contrast media. Findings are concerning for severe cellulitis and aggressive soft tissue infection. Emergent surgical consultation is warranted. 2. Soft tissue thickening and edematous changes of the adductor compartment of the right thigh could reflect some reactive or inflammatory myositis. No discernible intramuscular collection is seen. 3. Question endometrial thickening, greater than expected for patient age measuring up to 10 mm in thickness. Recommend further evaluation with outpatient pelvic ultrasound when patient is able to better tolerate. 4. Aortic Atherosclerosis (ICD10-I70.0). Currently attempting to contact the ordering provider with a critical value result. Addendum will be submitted upon case discussion. Electronically Signed: By: Lovena Le M.D. On: 03/06/2020 21:02   CT HEAD WO CONTRAST  Result Date: 03/06/2020 CLINICAL DATA:  Mental status change and cause EXAM: CT HEAD WITHOUT CONTRAST TECHNIQUE: Contiguous axial images were obtained from the base of the skull through the vertex without intravenous contrast. COMPARISON:  CT head 06/06/2013 FINDINGS: Brain: No evidence of acute infarction, hemorrhage, hydrocephalus, extra-axial collection or mass  lesion/mass effect. Symmetric prominence of the ventricles, cisterns and sulci compatible with parenchymal volume loss. Patchy areas of white matter hypoattenuation are most compatible with chronic microvascular angiopathy. Vascular: Atherosclerotic calcification of the carotid siphons0 and left intradural vertebral artery. No worrisome hyperdense vessel. Skull: No scalp swelling or calvarial fracture. No acute or suspicious osseous lesions Sinuses/Orbits: Minimal thickening in the ethmoid air cells. Remaining paranasal sinuses and slightly hypo pneumatized mastoid air cells are predominantly clear. Included orbital structures are unremarkable. Other: Debris noted in the left external auditory canal. IMPRESSION: 1. No acute intracranial findings. 2. Mild parenchymal volume loss and chronic microvascular angiopathy. 3. Debris in the left external auditory canal,  correlate for cerumen impaction. Electronically Signed   By: Lovena Le M.D.   On: 03/06/2020 20:46   DG Chest Port 1 View  Result Date: 03/06/2020 CLINICAL DATA:  Diabetic ketoacidosis EXAM: PORTABLE CHEST 1 VIEW COMPARISON:  04/13/2016 FINDINGS: Minimal left basilar opacity. No pleural effusion. Stable cardiomediastinal silhouette with aortic atherosclerosis. No pneumothorax. IMPRESSION: Minimal left basilar opacity, atelectasis versus mild pneumonia. Electronically Signed   By: Donavan Foil M.D.   On: 03/06/2020 17:28    Procedures .Critical Care Performed by: Merrily Pew, MD Authorized by: Merrily Pew, MD   Critical care provider statement:    Critical care time (minutes):  45   Critical care was necessary to treat or prevent imminent or life-threatening deterioration of the following conditions:  Sepsis and endocrine crisis   Critical care was time spent personally by me on the following activities:  Discussions with consultants, evaluation of patient's response to treatment, examination of patient, ordering and performing treatments  and interventions, ordering and review of laboratory studies, ordering and review of radiographic studies, pulse oximetry, re-evaluation of patient's condition, obtaining history from patient or surrogate and review of old charts   (including critical care time)  Medications Ordered in ED Medications  dextrose 50 % solution 0-50 mL (has no administration in time range)  heparin injection 5,000 Units (5,000 Units Subcutaneous Not Given 03/07/20 0004)  ondansetron (ZOFRAN) tablet 4 mg (has no administration in time range)    Or  ondansetron (ZOFRAN) injection 4 mg (has no administration in time range)  acetaminophen (TYLENOL) tablet 650 mg (650 mg Oral Given 03/06/20 1338)    Or  acetaminophen (TYLENOL) suppository 650 mg ( Rectal See Alternative 03/06/20 1338)  aspirin chewable tablet 81 mg (81 mg Oral Given 03/06/20 1318)  calcium carbonate (OS-CAL - dosed in mg of elemental calcium) tablet 1,250 mg (1,250 mg Oral Not Given 03/06/20 1638)  rosuvastatin (CRESTOR) tablet 5 mg (5 mg Oral Given 03/06/20 1338)  hydrOXYzine (ATARAX/VISTARIL) tablet 25-50 mg (has no administration in time range)  pantoprazole (PROTONIX) EC tablet 20 mg (20 mg Oral Given 03/06/20 1318)  insulin glargine (LANTUS) injection 10 Units (10 Units Subcutaneous Given 03/06/20 1559)  morphine 2 MG/ML injection 2 mg (2 mg Intravenous Given 03/06/20 2204)  insulin aspart (novoLOG) injection 0-9 Units (1 Units Subcutaneous Given 03/06/20 1853)  insulin aspart (novoLOG) injection 0-5 Units (3 Units Subcutaneous Given 03/07/20 0014)  lactated ringers infusion ( Intravenous New Bag/Given 03/06/20 2106)  piperacillin-tazobactam (ZOSYN) IVPB 3.375 g (3.375 g Intravenous New Bag/Given 03/07/20 0020)  vancomycin (VANCOCIN) IVPB 1000 mg/200 mL premix (1,000 mg Intravenous New Bag/Given 03/07/20 0043)  cefTRIAXone (ROCEPHIN) 1 g in sodium chloride 0.9 % 100 mL IVPB (0 g Intravenous Stopped 03/06/20 0336)  lactated ringers bolus 1,000 mL (0 mLs Intravenous Stopped  03/06/20 0430)    ED Course  I have reviewed the triage vital signs and the nursing notes.  Pertinent labs & imaging results that were available during my care of the patient were reviewed by me and considered in my medical decision making (see chart for details).    MDM Rules/Calculators/A&P                          Cellulitis and UTI. Also with significant hyperglycemia without gap but does have low bicarb. Started insulin drip. Started antibiotics. Fluids given. Admitted.   Final Clinical Impression(s) / ED Diagnoses Final diagnoses:  Lower urinary tract infectious disease  Cellulitis  of other specified site  Hyperglycemia    Rx / DC Orders ED Discharge Orders    None       Braylin Formby, Corene Cornea, MD 03/07/20 [phone]

## 2020-03-07 NOTE — Progress Notes (Signed)
Unable to complete admission do to patient cognitive condition.

## 2020-03-07 NOTE — H&P (View-Only) (Signed)
Pt seen examined and agree with Dr Georgette Dover NOTE Plan for OR today for incision and drainage of perineal / abdominal region infection with possible debridement The procedure has been discussed with the patient.  Alternative therapies have been discussed with the patient.  Operative risks include bleeding,  Infection,  Organ injury,  Nerve injury,  Blood vessel injury,  DVT,  Pulmonary embolism,  Death,  And possible reoperation.  Medical management risks include worsening of present situation.  The success of the procedure is 50 -90 % at treating patients symptoms.  The patient understands and agrees to proceed.

## 2020-03-07 NOTE — Progress Notes (Signed)
Inpatient Diabetes Program Recommendations  AACE/ADA: New Consensus Statement on Inpatient Glycemic Control (2015)  Target Ranges:  Prepandial:   less than 140 mg/dL      Peak postprandial:   less than 180 mg/dL (1-2 hours)      Critically ill patients:  140 - 180 mg/dL   Lab Results  Component Value Date   GLUCAP 346 (H) 03/07/2020   HGBA1C 7.2 (A) 01/10/2020    Review of Glycemic Control Results for [name], [name] (MRN [id number]) as of 03/07/2020 13:35  Ref. Range 03/06/2020 22:36 03/07/2020 06:43 03/07/2020 11:41  Glucose-Capillary Latest Ref Range: 70 - 99 mg/dL 262 (H) 274 (H) 346 (H)   Diabetes history:  DM2  Outpatient Diabetes medications:  Glucotrol XL 5 mg daily  Current orders for Inpatient glycemic control:  Lantus 10 units daily Novolog 0-9 units tid Novolog 0-5 qhs  Inpatient Diabetes Program Recommendations:     Lantus 15 units daily Novolog 0-15 units tid  Will continue to follow while inpatient.  Thank you, Reche Dixon, RN, BSN Diabetes Coordinator Inpatient Diabetes Program [phone] (team pager from 8a-5p)

## 2020-03-07 NOTE — Anesthesia Preprocedure Evaluation (Addendum)
Anesthesia Evaluation  Patient identified by MRN, date of birth, ID band  Reviewed: Allergy & Precautions, NPO status , Patient's Chart, lab work & pertinent test results  Airway Mallampati: II  TM Distance: >3 FB Neck ROM: Full    Dental  (+) Edentulous Upper, Partial Lower   Pulmonary COPD, Current Smoker and Patient abstained from smoking.,    breath sounds clear to auscultation + decreased breath sounds      Cardiovascular Exercise Tolerance: Poor hypertension, + CAD, + Past MI and +CHF   Rhythm:Regular Rate:Tachycardia   1. Left ventricular ejection fraction, by visual estimation, is 60 to  65%. The left ventricle has normal function. There is moderately increased  left ventricular hypertrophy.  2. Asymmetric hypertrophy measuring up to 39mm in basal septum (14mm in  posterior wall)  3. Left ventricular diastolic parameters are consistent with Grade II  diastolic dysfunction (pseudonormalization).  4. Elevated left atrial pressure.  5. Global right ventricle has normal systolic function.The right  ventricular size is normal. No increase in right ventricular wall  thickness.  6. The aortic valve is tricuspid. Aortic valve regurgitation is trivial.  No evidence of aortic valve sclerosis or stenosis.  7. The mitral valve is normal in structure. No evidence of mitral valve  regurgitation.  8. The tricuspid valve is normal in structure. Tricuspid valve  regurgitation is trivial.  9. The pulmonic valve was not well visualized. Pulmonic valve  regurgitation is not visualized.  10. Left atrial size was mildly dilated.  11. Right atrial size was normal.  12. The inferior vena cava is normal in size with greater than 50%  respiratory variability, suggesting right atrial pressure of 3 mmHg.  13. The tricuspid regurgitant velocity is 2.58 m/s, and with an assumed  right atrial pressure of 3 mmHg, the estimated right  ventricular systolic  pressure is normal at 29.6 mmHg.    Neuro/Psych negative neurological ROS  negative psych ROS   GI/Hepatic GERD  ,  Endo/Other  diabetes, Poorly Controlled, Type 2, Insulin Dependent  Renal/GU Renal InsufficiencyRenal disease  negative genitourinary   Musculoskeletal Low back pain   Abdominal Normal abdominal exam  (+)   Peds  Hematology  (+) anemia , Hct 24   Anesthesia Other Findings   Reproductive/Obstetrics                            Anesthesia Physical Anesthesia Plan  ASA: IV  Anesthesia Plan: General   Post-op Pain Management:    Induction: Intravenous  PONV Risk Score and Plan: 2 and Dexamethasone and Ondansetron  Airway Management Planned: Oral ETT  Additional Equipment:   Intra-op Plan:   Post-operative Plan:   Informed Consent: I have reviewed the patients History and Physical, chart, labs and discussed the procedure including the risks, benefits and alternatives for the proposed anesthesia with the patient or authorized representative who has indicated his/her understanding and acceptance.       Plan Discussed with: Anesthesiologist and CRNA  Anesthesia Plan Comments:         Anesthesia Quick Evaluation

## 2020-03-07 NOTE — Anesthesia Procedure Notes (Signed)
Procedure Name: Intubation Date/Time: 03/07/2020 3:52 PM Performed by: Renato Shin, CRNA Pre-anesthesia Checklist: Patient identified, Emergency Drugs available, Suction available and Patient being monitored Patient Re-evaluated:Patient Re-evaluated prior to induction Oxygen Delivery Method: Circle system utilized Preoxygenation: Pre-oxygenation with 100% oxygen Induction Type: IV induction Ventilation: Mask ventilation without difficulty Laryngoscope Size: Miller and 2 Grade View: Grade I Tube type: Oral Tube size: 7.0 mm Number of attempts: 1 Airway Equipment and Method: Stylet Placement Confirmation: ETT inserted through vocal cords under direct vision,  positive ETCO2 and breath sounds checked- equal and bilateral Secured at: 21 cm Tube secured with: Tape Dental Injury: Teeth and Oropharynx as per pre-operative assessment

## 2020-03-07 NOTE — Consult Note (Signed)
Reason for Consult:  Cellulitis/ abscess lower abdomen and groin Referring Physician: [name], [name] is an 69 y.o. female.  HPI: This is a 69 year old female with DM2, CKD, HTN brought to the ED yesterday after being found down.  She was in DKA and has not taken her meds in over a week.  She had several abscess in the right groin that began spontaneously draining.  Her WBC was 31K.  This evening, she underwent CT scan that showed thickening and stranding of the lower pannus with a large phlegmon in the right groin.   She has been hemodynamically stable and afebrile all day.  Nursing states that she is mildly disoriented.    Past Medical History:  Diagnosis Date  . Anemia   . COPD (chronic obstructive pulmonary disease) ([place])   . Diabetes mellitus   . Hyperlipidemia   . Hypertension   . Ischemic cardiomyopathy 06/11/2013  . Low back pain   . NSTEMI (non-ST elevated myocardial infarction) ([place]) 06/11/2013  . Obesity   . Personal history of colon cancer   . Renal insufficiency     Past Surgical History:  Procedure Laterality Date  . COLECTOMY    . LEFT HEART CATHETERIZATION WITH CORONARY ANGIOGRAM N/A 06/09/2013   Procedure: LEFT HEART CATHETERIZATION WITH CORONARY ANGIOGRAM;  Surgeon: Ramond Dial, MD;  Location: [facility] CATH LAB;  Service: Cardiovascular;  Laterality: N/A;    Family History  Problem Relation Age of Onset  . Hypertension Other     Social History:  reports that she has been smoking cigarettes. She has never used smokeless tobacco. She reports that she does not drink alcohol and does not use drugs.  Allergies:  Allergies  Allergen Reactions  . Tradjenta [Linagliptin] Anaphylaxis    CP  . Atorvastatin     REACTION: aches and pains  . Enalapril Maleate     REACTION: cough  . Farxiga [Dapagliflozin] Itching  . Hydrochlorothiazide     REACTION: hair loss  . Kenalog [Triamcinolone Acetonide]     HANDS NUMB   . Metformin And Related      Diarrhea, dizziness  . Propoxyphene N-Acetaminophen Hives  . Simvastatin     REACTION: cramps  . Spironolactone     REACTION: cramps    Medications:  Prior to Admission medications   Medication Sig Start Date End Date Taking? Authorizing Provider  amLODipine (NORVASC) 10 MG tablet TAKE ONE-HALF TABLET BY  MOUTH DAILY Patient taking differently: Take 5 mg by mouth daily.  12/19/19  Yes [name], Evie Lacks, MD  aspirin 81 MG chewable tablet Chew 1 tablet (81 mg total) by mouth daily. 06/11/13  Yes Erick Colace, NP  BYSTOLIC 10 MG tablet TAKE 1 TABLET BY MOUTH EVERY DAY Patient taking differently: Take 10 mg by mouth daily.  12/18/19  Yes [name], Evie Lacks, MD  calcium carbonate (OS-CAL) 600 MG TABS tablet Take 600 mg by mouth 2 (two) times daily with a meal.   Yes [provider]  Cholecalciferol (VITAMIN D3) 50 MCG (2000 UT) capsule Take 1 capsule (2,000 Units total) by mouth daily. 09/21/19  Yes [name], Evie Lacks, MD  glipiZIDE (GLUCOTROL XL) 5 MG 24 hr tablet TAKE 1 TABLET BY MOUTH  DAILY WITH BREAKFAST Patient taking differently: Take 5 mg by mouth daily with breakfast.  12/19/19  Yes Philemon Kingdom, MD  hydrALAZINE (APRESOLINE) 25 MG tablet TAKE 1 TABLET BY MOUTH THREE TIMES A DAY Patient taking differently: Take 25  mg by mouth 3 (three) times daily.  12/19/19  Yes Nahser, Wonda Cheng, MD  hydrOXYzine (ATARAX/VISTARIL) 25 MG tablet Take 1-2 tablets (25-50 mg total) by mouth every 8 (eight) hours as needed for itching. 12/08/16  Yes [name], Evie Lacks, MD  lansoprazole (PREVACID) 15 MG capsule Take 1 capsule (15 mg total) by mouth daily at 12 noon. Patient taking differently: Take 15 mg by mouth daily as needed (acid reflux).  01/10/19  Yes [name], Evie Lacks, MD  nitroGLYCERIN (NITROSTAT) 0.4 MG SL tablet DISSOLVE 1 TABLET UNDER THE TONGUE EVERY 5 MINUTES AS  NEEDED FOR CHEST PAIN Patient taking differently: Place 0.4 mg under the tongue every 5 (five) minutes as needed for  chest pain. DISSOLVE 1 TABLET UNDER THE TONGUE EVERY 5 MINUTES AS  NEEDED FOR CHEST PAIN 05/16/19  Yes [name], Evie Lacks, MD  ondansetron (ZOFRAN) 4 MG tablet Take 1 tablet (4 mg total) by mouth every 8 (eight) hours as needed for nausea or vomiting. 03/28/19  Yes [name], Evie Lacks, MD  potassium chloride SA (KLOR-CON) 20 MEQ tablet TAKE 1 TABLET BY MOUTH  DAILY Patient taking differently: Take 20 mEq by mouth daily as needed (cramping).  12/19/19  Yes [name], Evie Lacks, MD  rosuvastatin (CRESTOR) 5 MG tablet TAKE 1 TABLET BY MOUTH  DAILY AT 6PM Patient taking differently: Take 5 mg by mouth daily.  02/27/19  Yes [name], Evie Lacks, MD  torsemide (DEMADEX) 100 MG tablet TAKE 0.5-1 TABLETS (50-100 MG TOTAL) BY MOUTH DAILY. Patient taking differently: Take 50 mg by mouth daily.  12/18/19  Yes [name], Evie Lacks, MD  vitamin B-12 (CYANOCOBALAMIN) 1000 MCG tablet Take 1,000 mcg by mouth daily.   Yes [provider]  glucose blood (ONETOUCH ULTRA) test strip USE TO TEST BLOOD SUGAR TWO TIMES A DAY AS DIRECTED 01/10/19   [name], Evie Lacks, MD     Results for orders placed or performed during the hospital encounter of 03/05/20 (from the past 48 hour(s))  CBG monitoring, ED     Status: Abnormal   Collection Time: 03/05/20  2:28 PM  Result Value Ref Range   Glucose-Capillary 547 (HH) 70 - 99 mg/dL    Comment: Glucose reference range applies only to samples taken after fasting for at least 8 hours.   Comment 1 Notify RN   Basic metabolic panel     Status: Abnormal   Collection Time: 03/05/20  2:29 PM  Result Value Ref Range   Sodium 129 (L) 135 - 145 mmol/L   Potassium 4.6 3.5 - 5.1 mmol/L   Chloride 98 98 - 111 mmol/L   CO2 16 (L) 22 - 32 mmol/L   Glucose, Bld 674 (HH) 70 - 99 mg/dL    Comment: Glucose reference range applies only to samples taken after fasting for at least 8 hours. CRITICAL RESULT CALLED TO, READ BACK BY AND VERIFIED WITH: T.PHILLIPS,RN 1521 03/05/20 CLARK,S     BUN 119 (H) 8 - 23 mg/dL   Creatinine, Ser 2.78 (H) 0.44 - 1.00 mg/dL   Calcium 9.2 8.9 - 10.3 mg/dL   [name] calc non Af Amer 17 (L) >60 mL/min   [name] calc Af Amer 19 (L) >60 mL/min   Anion gap 15 5 - 15    Comment: Performed at [facility] [address] [place],  [address]  CBC     Status: Abnormal   Collection Time: 03/05/20  2:29 PM  Result Value Ref Range   WBC 31.7 (H)  4.0 - 10.5 K/uL   RBC 3.06 (L) 3.87 - 5.11 MIL/uL   Hemoglobin 8.6 (L) 12.0 - 15.0 g/dL   HCT 28.5 (L) 36 - 46 %   MCV 93.1 80.0 - 100.0 fL   MCH 28.1 26.0 - 34.0 pg   MCHC 30.2 30.0 - 36.0 g/dL   RDW 15.8 (H) 11.5 - 15.5 %   Platelets 193 150 - 400 K/uL   nRBC 0.1 0.0 - 0.2 %    Comment: Performed at [facility] [address], [place], [place] [address]  CK     Status: None   Collection Time: 03/05/20  2:29 PM  Result Value Ref Range   Total CK 104 38.0 - 234.0 U/L    Comment: Performed at [facility], [place] [address], [place], [place] [address]  Urinalysis, Routine w reflex microscopic Urine, Clean Catch     Status: Abnormal   Collection Time: 03/05/20  8:40 PM  Result Value Ref Range   Color, Urine YELLOW YELLOW   APPearance CLOUDY (A) CLEAR   Specific Gravity, Urine 1.010 1.005 - 1.030   pH 5.0 5.0 - 8.0   Glucose, UA >=500 (A) NEGATIVE mg/dL   Hgb urine dipstick MODERATE (A) NEGATIVE   Bilirubin Urine NEGATIVE NEGATIVE   Ketones, ur NEGATIVE NEGATIVE mg/dL   Protein, ur NEGATIVE NEGATIVE mg/dL   Nitrite NEGATIVE NEGATIVE   Leukocytes,Ua LARGE (A) NEGATIVE   RBC / HPF 11-20 0 - 5 RBC/hpf   WBC, UA 21-50 0 - 5 WBC/hpf   Bacteria, UA FEW (A) NONE SEEN   Squamous Epithelial / LPF 6-10 0 - 5   Mucus PRESENT    Amorphous Crystal PRESENT     Comment: Performed at [facility], [place] [address], [place], [place] [address]  CBG monitoring, ED     Status: Abnormal   Collection Time: 03/06/20  1:43 AM  Result Value Ref Range   Glucose-Capillary >600 (HH) 70 - 99 mg/dL     Comment: Glucose reference range applies only to samples taken after fasting for at least 8 hours.  Basic metabolic panel     Status: Abnormal   Collection Time: 03/06/20  3:06 AM  Result Value Ref Range   Sodium 127 (L) 135 - 145 mmol/L   Potassium 5.2 (H) 3.5 - 5.1 mmol/L   Chloride 95 (L) 98 - 111 mmol/L   CO2 16 (L) 22 - 32 mmol/L   Glucose, Bld 657 (HH) 70 - 99 mg/dL    Comment: Glucose reference range applies only to samples taken after fasting for at least 8 hours. CRITICAL RESULT CALLED TO, READ BACK BY AND VERIFIED WITH: S.BENSON,RN 0422 03/06/2020 M.CAMPBELL    BUN 130 (H) 8 - 23 mg/dL   Creatinine, Ser 2.79 (H) 0.44 - 1.00 mg/dL   Calcium 9.3 8.9 - 10.3 mg/dL   [name] calc non Af Amer 17 (L) >60 mL/min   [name] calc Af Amer 19 (L) >60 mL/min   Anion gap 16 (H) 5 - 15    Comment: Performed at [facility] [address] [place], [place] [address]  Beta-hydroxybutyric acid     Status: Abnormal   Collection Time: 03/06/20  3:06 AM  Result Value Ref Range   Beta-Hydroxybutyric Acid 1.98 (H) 0.05 - 0.27 mmol/L    Comment: Performed at [facility] [address] [place], [place] [address]  SARS Coronavirus 2 by RT PCR (hospital order, performed  in [place] lab) Nasopharyngeal Nasopharyngeal Swab     Status: None   Collection Time: 03/06/20  3:06 AM   Specimen: Nasopharyngeal Swab  Result Value Ref Range   SARS Coronavirus 2 NEGATIVE NEGATIVE    Comment: (NOTE) SARS-CoV-2 target nucleic acids are NOT DETECTED.  The SARS-CoV-2 RNA is generally detectable in upper and lower respiratory specimens during the acute phase of infection. The lowest concentration of SARS-CoV-2 viral copies this assay can detect is 250 copies / mL. A negative result does not preclude SARS-CoV-2 infection and should not be used as the sole basis for treatment or other patient management decisions.  A negative result may occur with improper specimen collection / handling,  submission of specimen other than nasopharyngeal swab, presence of viral mutation(s) within the areas targeted by this assay, and inadequate number of viral copies (<250 copies / mL). A negative result must be combined with clinical observations, patient history, and epidemiological information.  Fact Sheet for Patients:   StrictlyIdeas.no  Fact Sheet for Healthcare Providers: BankingDealers.co.za  This test is not yet approved or  cleared by the [place] FDA and has been authorized for detection and/or diagnosis of SARS-CoV-2 by FDA under an Emergency Use Authorization (EUA).  This EUA will remain in effect (meaning this test can be used) for the duration of the COVID-19 declaration under Section 564(b)(1) of the Act, 21 U.S.C. section 360bbb-3(b)(1), unless the authorization is terminated or revoked sooner.  Performed at [facility], [place] [address] [place], [place] [address]   I-Stat venous blood gas, ED     Status: Abnormal   Collection Time: 03/06/20  3:19 AM  Result Value Ref Range   pH, Ven 7.340 7.25 - 7.43   pCO2, Ven 32.5 (L) 44 - 60 mmHg   pO2, Ven 26.0 (LL) 32 - 45 mmHg   Bicarbonate 17.5 (L) 20.0 - 28.0 mmol/L   TCO2 19 (L) 22 - 32 mmol/L   O2 Saturation 44.0 %   Acid-base deficit 7.0 (H) 0.0 - 2.0 mmol/L   Sodium 133 (L) 135 - 145 mmol/L   Potassium 5.3 (H) 3.5 - 5.1 mmol/L   Calcium, Ion 1.31 1.15 - 1.40 mmol/L   HCT 32.0 (L) 36 - 46 %   Hemoglobin 10.9 (L) 12.0 - 15.0 g/dL   Sample type VENOUS   CBG monitoring, ED     Status: Abnormal   Collection Time: 03/06/20  4:25 AM  Result Value Ref Range   Glucose-Capillary 377 (H) 70 - 99 mg/dL    Comment: Glucose reference range applies only to samples taken after fasting for at least 8 hours.  CBG monitoring, ED     Status: Abnormal   Collection Time: 03/06/20  5:41 AM  Result Value Ref Range   Glucose-Capillary 437 (H) 70 - 99 mg/dL    Comment: Glucose  reference range applies only to samples taken after fasting for at least 8 hours.  HIV Antibody (routine testing w rflx)     Status: None   Collection Time: 03/06/20  6:03 AM  Result Value Ref Range   HIV Screen 4th Generation wRfx Non Reactive Non Reactive    Comment: Performed at [facility], [address] [place] [address]  CBC     Status: Abnormal   Collection Time: 03/06/20  6:03 AM  Result Value Ref Range   WBC 27.7 (H) 4.0 - 10.5 K/uL   RBC 2.91 (L) 3.87 - 5.11 MIL/uL  Hemoglobin 8.1 (L) 12.0 - 15.0 g/dL   HCT 26.6 (L) 36 - 46 %   MCV 91.4 80.0 - 100.0 fL   MCH 27.8 26.0 - 34.0 pg   MCHC 30.5 30.0 - 36.0 g/dL   RDW 15.7 (H) 11.5 - 15.5 %   Platelets 192 150 - 400 K/uL   nRBC 0.1 0.0 - 0.2 %    Comment: Performed at [facility] [address], [place], [place] [address]  Lactic acid, plasma     Status: Abnormal   Collection Time: 03/06/20  6:04 AM  Result Value Ref Range   Lactic Acid, Venous 2.9 (HH) 0.5 - 1.9 mmol/L    Comment: CRITICAL RESULT CALLED TO, READ BACK BY AND VERIFIED WITH: S.VASQUEZ,RN 03/06/2020 2706 DAVISB Performed at [facility], [place] [address], [place], [place] [address]   Basic metabolic panel     Status: Abnormal   Collection Time: 03/06/20  6:05 AM  Result Value Ref Range   Sodium 132 (L) 135 - 145 mmol/L   Potassium 4.6 3.5 - 5.1 mmol/L   Chloride 101 98 - 111 mmol/L   CO2 16 (L) 22 - 32 mmol/L   Glucose, Bld 444 (H) 70 - 99 mg/dL    Comment: Glucose reference range applies only to samples taken after fasting for at least 8 hours.   BUN 118 (H) 8 - 23 mg/dL   Creatinine, Ser 2.49 (H) 0.44 - 1.00 mg/dL   Calcium 9.1 8.9 - 10.3 mg/dL   [name] calc non Af Amer 19 (L) >60 mL/min   [name] calc Af Amer 22 (L) >60 mL/min   Anion gap 15 5 - 15    Comment: Performed at [facility] [address] [place], [place] [address]  CBG monitoring, ED     Status: Abnormal   Collection Time: 03/06/20  6:55 AM  Result Value Ref  Range   Glucose-Capillary 427 (H) 70 - 99 mg/dL    Comment: Glucose reference range applies only to samples taken after fasting for at least 8 hours.  CBG monitoring, ED     Status: Abnormal   Collection Time: 03/06/20  8:30 AM  Result Value Ref Range   Glucose-Capillary 442 (H) 70 - 99 mg/dL    Comment: Glucose reference range applies only to samples taken after fasting for at least 8 hours.  Lactic acid, plasma     Status: Abnormal   Collection Time: 03/06/20  8:33 AM  Result Value Ref Range   Lactic Acid, Venous 2.2 (HH) 0.5 - 1.9 mmol/L    Comment: CRITICAL VALUE NOTED.  VALUE IS CONSISTENT WITH PREVIOUSLY REPORTED AND CALLED VALUE. Performed at [facility], [place] [address] [place], [place] [address]   CBG monitoring, ED     Status: Abnormal   Collection Time: 03/06/20  9:36 AM  Result Value Ref Range   Glucose-Capillary 454 (H) 70 - 99 mg/dL    Comment: Glucose reference range applies only to samples taken after fasting for at least 8 hours.  CBG monitoring, ED     Status: Abnormal   Collection Time: 03/06/20 10:19 AM  Result Value Ref Range   Glucose-Capillary 411 (H) 70 - 99 mg/dL    Comment: Glucose reference range applies only to samples taken after fasting for at least 8 hours.  CBG monitoring, ED     Status: Abnormal   Collection Time: 03/06/20 11:01 AM  Result Value Ref Range  Glucose-Capillary 371 (H) 70 - 99 mg/dL    Comment: Glucose reference range applies only to samples taken after fasting for at least 8 hours.  CBG monitoring, ED     Status: Abnormal   Collection Time: 03/06/20 12:07 PM  Result Value Ref Range   Glucose-Capillary 210 (H) 70 - 99 mg/dL    Comment: Glucose reference range applies only to samples taken after fasting for at least 8 hours.  CBG monitoring, ED     Status: Abnormal   Collection Time: 03/06/20  1:17 PM  Result Value Ref Range   Glucose-Capillary 146 (H) 70 - 99 mg/dL    Comment: Glucose reference range applies only to samples  taken after fasting for at least 8 hours.  Basic metabolic panel     Status: Abnormal   Collection Time: 03/06/20  2:33 PM  Result Value Ref Range   Sodium 139 135 - 145 mmol/L   Potassium 4.1 3.5 - 5.1 mmol/L   Chloride 108 98 - 111 mmol/L   CO2 18 (L) 22 - 32 mmol/L   Glucose, Bld 165 (H) 70 - 99 mg/dL    Comment: Glucose reference range applies only to samples taken after fasting for at least 8 hours.   BUN 100 (H) 8 - 23 mg/dL   Creatinine, Ser 2.20 (H) 0.44 - 1.00 mg/dL   Calcium 9.2 8.9 - 10.3 mg/dL   [name] calc non Af Amer 22 (L) >60 mL/min   [name] calc Af Amer 26 (L) >60 mL/min   Anion gap 13 5 - 15    Comment: Performed at [facility] [address] [place], [place] [address]  CBG monitoring, ED     Status: Abnormal   Collection Time: 03/06/20  2:41 PM  Result Value Ref Range   Glucose-Capillary 161 (H) 70 - 99 mg/dL    Comment: Glucose reference range applies only to samples taken after fasting for at least 8 hours.  CBG monitoring, ED     Status: Abnormal   Collection Time: 03/06/20  3:58 PM  Result Value Ref Range   Glucose-Capillary 149 (H) 70 - 99 mg/dL    Comment: Glucose reference range applies only to samples taken after fasting for at least 8 hours.   Comment 1 Notify RN   MRSA PCR Screening     Status: None   Collection Time: 03/06/20 10:07 PM   Specimen: Nasal Mucosa; Nasopharyngeal  Result Value Ref Range   MRSA by PCR NEGATIVE NEGATIVE    Comment:        The GeneXpert MRSA Assay (FDA approved for NASAL specimens only), is one component of a comprehensive MRSA colonization surveillance program. It is not intended to diagnose MRSA infection nor to guide or monitor treatment for MRSA infections. Performed at [facility], [place] [address] [place],  [address]   Basic metabolic panel     Status: Abnormal   Collection Time: 03/06/20 10:16 PM  Result Value Ref Range   Sodium 139 135 - 145 mmol/L   Potassium 4.3 3.5 - 5.1 mmol/L    Chloride 109 98 - 111 mmol/L   CO2 16 (L) 22 - 32 mmol/L   Glucose, Bld 256 (H) 70 - 99 mg/dL    Comment: Glucose reference range applies only to samples taken after fasting for at least 8 hours.   BUN 94 (H) 8 - 23 mg/dL   Creatinine, Ser 2.15 (H) 0.44 - 1.00 mg/dL   Calcium 9.1  8.9 - 10.3 mg/dL   [name] calc non Af Amer 23 (L) >60 mL/min   [name] calc Af Amer 26 (L) >60 mL/min   Anion gap 14 5 - 15    Comment: Performed at [facility] [address], [place], [place] [address]  Glucose, capillary     Status: Abnormal   Collection Time: 03/06/20 10:36 PM  Result Value Ref Range   Glucose-Capillary 262 (H) 70 - 99 mg/dL    Comment: Glucose reference range applies only to samples taken after fasting for at least 8 hours.   Comment 1 Notify RN    Comment 2 Document in Chart     CT ABDOMEN PELVIS WO CONTRAST  Addendum Date: 03/06/2020   ADDENDUM REPORT: 03/06/2020 21:05 ADDENDUM: Critical Value/emergent results were called by telephone at the time of interpretation on 03/06/2020 at 9:05 pm to provider Sharlet Salina NP , who verbally acknowledged these results. Electronically Signed   By: Lovena Le M.D.   On: 03/06/2020 21:05   Result Date: 03/06/2020 CLINICAL DATA:  Abdominal pain, abdominal and groin cellulitis EXAM: CT ABDOMEN AND PELVIS WITHOUT CONTRAST TECHNIQUE: Multidetector CT imaging of the abdomen and pelvis was performed following the standard protocol without IV contrast. COMPARISON:  None. FINDINGS: Lower chest: Lung bases are clear. Cardiac size at the upper limits normal. Coronary artery calcifications are present. Suspect calcification of the mitral annulus as well. Hypoattenuation of the cardiac blood pool may reflect a mild anemia. Hepatobiliary: No visible concerning liver lesion within the limitations of this unenhanced of motion degraded CT. Smooth liver surface contour. Normal liver attenuation. Gallbladder is unremarkable. Question some biliary ductal dilatation difficult to fully  discern given the lack of contrast media and extensive motion artifact. The extrahepatic common bile duct may measure up to 1.5 cm at the level of the pancreatic head. No visible intraductal gallstones. Pancreas: Poorly assessed given extensive motion artifact photon starvation. No pancreatic ductal dilatation or surrounding inflammatory changes. Spleen: Normal in size. No concerning splenic lesions. Splenic hilar vascular calcifications are noted. Adrenals/Urinary Tract: Adrenal glands are unremarkable. Kidneys are symmetric in size and normally located. Question some mild perinephric stranding versus volume averaging in the setting of motion artifact. No visible concerning renal lesion. No urolithiasis or hydronephrosis. Urinary bladder is unremarkable. Stomach/Bowel: Small sliding-type hiatal hernia. Distal stomach and duodenum are unremarkable. No small or large bowel thickening or dilatation. No evidence of bowel obstruction. Appendix is not visualized. No focal inflammation the vicinity of the cecum to suggest an occult appendicitis. Vascular/Lymphatic: Atherosclerotic calcifications within the abdominal aorta and branch vessels. No aneurysm or ectasia. No enlarged abdominopelvic lymph nodes. Likely reactive adenopathy seen in the inguinal regions. Reproductive: Anteverted uterus. Question endometrial thickening, greater than expected for patient age measuring up to 10 mm in struck thickness. No concerning adnexal lesions. Few parametrial calcifications are atypical senescent finding Other: There is extensive soft tissue stranding and thickening extending across the low anterior abdominal wall/panniculus. Large lobular phlegmon in the right groin/pudendal tissues superficial to the right adductor musculature and right inferior pubic ramus anteriorly with small amount of soft tissue gas. Is region is difficult to accurately measure given the lobular margins of med does extend up to 4.3 x 6.2 cm in maximal  transaxial dimensions. Organized collection/abscess formation is difficult to ascertain in the absence of contrast media. Musculoskeletal: Soft tissue thickening and edematous changes of the adductor compartment of the right thigh could reflect some reactive or inflammatory myositis. No discernible intramuscular  collection is seen. No acute or suspicious osseous lesions. Multilevel degenerative changes in the spine, hips and pelvis. Mild grade 1 anterolisthesis L4 on 5. No spondylolysis is evident. IMPRESSION: 1. Extensive soft tissue stranding and thickening extending across the low anterior abdominal wall/panniculus with a large, heterogeneous phlegmon within the deep space of the right groin and pudendal soft tissues along the right adductor musculature and right inferior pubic ramus with small amount of soft tissue gas. Organized collection/abscess formation is difficult to ascertain in the absence of contrast media. Findings are concerning for severe cellulitis and aggressive soft tissue infection. Emergent surgical consultation is warranted. 2. Soft tissue thickening and edematous changes of the adductor compartment of the right thigh could reflect some reactive or inflammatory myositis. No discernible intramuscular collection is seen. 3. Question endometrial thickening, greater than expected for patient age measuring up to 10 mm in thickness. Recommend further evaluation with outpatient pelvic ultrasound when patient is able to better tolerate. 4. Aortic Atherosclerosis (ICD10-I70.0). Currently attempting to contact the ordering provider with a critical value result. Addendum will be submitted upon case discussion. Electronically Signed: By: Lovena Le M.D. On: 03/06/2020 21:02   CT HEAD WO CONTRAST  Result Date: 03/06/2020 CLINICAL DATA:  Mental status change and cause EXAM: CT HEAD WITHOUT CONTRAST TECHNIQUE: Contiguous axial images were obtained from the base of the skull through the vertex without  intravenous contrast. COMPARISON:  CT head 06/06/2013 FINDINGS: Brain: No evidence of acute infarction, hemorrhage, hydrocephalus, extra-axial collection or mass lesion/mass effect. Symmetric prominence of the ventricles, cisterns and sulci compatible with parenchymal volume loss. Patchy areas of white matter hypoattenuation are most compatible with chronic microvascular angiopathy. Vascular: Atherosclerotic calcification of the carotid siphons0 and left intradural vertebral artery. No worrisome hyperdense vessel. Skull: No scalp swelling or calvarial fracture. No acute or suspicious osseous lesions Sinuses/Orbits: Minimal thickening in the ethmoid air cells. Remaining paranasal sinuses and slightly hypo pneumatized mastoid air cells are predominantly clear. Included orbital structures are unremarkable. Other: Debris noted in the left external auditory canal. IMPRESSION: 1. No acute intracranial findings. 2. Mild parenchymal volume loss and chronic microvascular angiopathy. 3. Debris in the left external auditory canal, correlate for cerumen impaction. Electronically Signed   By: Lovena Le M.D.   On: 03/06/2020 20:46   DG Chest Port 1 View  Result Date: 03/06/2020 CLINICAL DATA:  Diabetic ketoacidosis EXAM: PORTABLE CHEST 1 VIEW COMPARISON:  04/13/2016 FINDINGS: Minimal left basilar opacity. No pleural effusion. Stable cardiomediastinal silhouette with aortic atherosclerosis. No pneumothorax. IMPRESSION: Minimal left basilar opacity, atelectasis versus mild pneumonia. Electronically Signed   By: Donavan Foil M.D.   On: 03/06/2020 17:28    Review of Systems  HENT: Negative for ear discharge, ear pain, hearing loss and tinnitus.   Eyes: Negative for photophobia and pain.  Respiratory: Negative for cough and shortness of breath.   Cardiovascular: Negative for chest pain.  Gastrointestinal: Positive for abdominal pain. Negative for nausea and vomiting.  Genitourinary: Negative for dysuria, flank pain,  frequency and urgency.  Musculoskeletal: Negative for back pain, myalgias and neck pain.  Skin: Positive for color change and wound.  Neurological: Negative for dizziness and headaches.  Hematological: Does not bruise/bleed easily.  Psychiatric/Behavioral: The patient is not nervous/anxious.    Blood pressure (!) 134/53, pulse 82, temperature 98.4 F (36.9 C), temperature source Oral, resp. rate 18, height 5\' 4"  (1.626 m), weight 86.4 kg, SpO2 100 %. Physical Exam Constitutional:  WDWN in NAD, resting comfortably, no obvious deformities;  lying in bed comfortably Eyes:  Pupils equal, round; sclera anicteric; moist conjunctiva; no lid lag HENT:  Oral mucosa moist; good dentition  Neck:  No masses palpated, trachea midline; no thyromegaly Lungs:  CTA bilaterally; normal respiratory effort CV:  Regular rate and rhythm; no murmurs; extremities well-perfused with no edema Abd:  +bowel sounds, lower pannus thickened and tender, but no erythema Thickening and induration tracks down into the right groin.  In the right perineum/ perirectal area there are a couple of open abscess. Musc:  Unable to assess gait; no apparent clubbing or cyanosis in extremities Lymphatic:  No palpable cervical or axillary lymphadenopathy Skin:  Warm, dry; no sign of jaundice Psychiatric - alert and oriented x 4; calm mood and affect  Assessment/Plan: Perineal abscesses with widespread cellulitis of pannus and right groin  Keep NPO IV antibiotics To OR later today for wound exploration/ debridement under anesthesia.    [name] 03/07/2020, 1:11 AM

## 2020-03-08 ENCOUNTER — Encounter (HOSPITAL_COMMUNITY): Payer: Self-pay | Admitting: Surgery

## 2020-03-08 LAB — CBC WITH DIFFERENTIAL/PLATELET
Abs Immature Granulocytes: 0.83 10*3/uL — ABNORMAL HIGH (ref 0.00–0.07)
Basophils Absolute: 0.1 10*3/uL (ref 0.0–0.1)
Basophils Relative: 0 %
Eosinophils Absolute: 0 10*3/uL (ref 0.0–0.5)
Eosinophils Relative: 0 %
HCT: 23.4 % — ABNORMAL LOW (ref 36.0–46.0)
Hemoglobin: 7.4 g/dL — ABNORMAL LOW (ref 12.0–15.0)
Immature Granulocytes: 3 %
Lymphocytes Relative: 2 %
Lymphs Abs: 0.4 10*3/uL — ABNORMAL LOW (ref 0.7–4.0)
MCH: 28.6 pg (ref 26.0–34.0)
MCHC: 31.6 g/dL (ref 30.0–36.0)
MCV: 90.3 fL (ref 80.0–100.0)
Monocytes Absolute: 0.7 10*3/uL (ref 0.1–1.0)
Monocytes Relative: 3 %
Neutro Abs: 25.8 10*3/uL — ABNORMAL HIGH (ref 1.7–7.7)
Neutrophils Relative %: 92 %
Platelets: 198 10*3/uL (ref 150–400)
RBC: 2.59 MIL/uL — ABNORMAL LOW (ref 3.87–5.11)
RDW: 15.9 % — ABNORMAL HIGH (ref 11.5–15.5)
WBC: 27.9 10*3/uL — ABNORMAL HIGH (ref 4.0–10.5)
nRBC: 0 % (ref 0.0–0.2)

## 2020-03-08 LAB — GLUCOSE, CAPILLARY
Glucose-Capillary: 378 mg/dL — ABNORMAL HIGH (ref 70–99)
Glucose-Capillary: 462 mg/dL — ABNORMAL HIGH (ref 70–99)
Glucose-Capillary: 308 mg/dL — ABNORMAL HIGH (ref 70–99)
Glucose-Capillary: 200 mg/dL — ABNORMAL HIGH (ref 70–99)

## 2020-03-08 LAB — BASIC METABOLIC PANEL
Anion gap: 14 (ref 5–15)
BUN: 71 mg/dL — ABNORMAL HIGH (ref 8–23)
CO2: 18 mmol/L — ABNORMAL LOW (ref 22–32)
Calcium: 8.6 mg/dL — ABNORMAL LOW (ref 8.9–10.3)
Chloride: 109 mmol/L (ref 98–111)
Creatinine, Ser: 2.07 mg/dL — ABNORMAL HIGH (ref 0.44–1.00)
GFR calc Af Amer: 28 mL/min — ABNORMAL LOW (ref >60)
GFR calc non Af Amer: 24 mL/min — ABNORMAL LOW (ref >60)
Glucose, Bld: 419 mg/dL — ABNORMAL HIGH (ref 70–99)
Potassium: 4.6 mmol/L (ref 3.5–5.1)
Sodium: 141 mmol/L (ref 135–145)

## 2020-03-08 LAB — URINE CULTURE: Culture: 60000 — AB

## 2020-03-08 MED ORDER — INSULIN ASPART 100 UNIT/ML ~~LOC~~ SOLN
15.0000 [IU] | Freq: Once | SUBCUTANEOUS | Status: AC
  Administered 2020-03-08: 15 [IU] via SUBCUTANEOUS

## 2020-03-08 MED ORDER — INSULIN ASPART 100 UNIT/ML ~~LOC~~ SOLN
5.0000 [IU] | Freq: Three times a day (TID) | SUBCUTANEOUS | Status: DC
  Administered 2020-03-08: 5 [IU] via SUBCUTANEOUS

## 2020-03-08 NOTE — Plan of Care (Signed)

## 2020-03-08 NOTE — Evaluation (Signed)
Physical Therapy Evaluation Patient Details Name: [name] MRN: [id number] DOB: Apr 04, 1951 Today's Date: 03/08/2020   History of Present Illness  The pt is a 69 yo female presenting s/p I&D of perineum and pannus abcess on 9/2. She presented to the ED after a fall due to severe levels of pain from her abcesses. PMH includes: COPD, DM II, HLD< HTN, NSTEMI, renal insufficiency, and obesity.  Clinical Impression  Pt in bed upon arrival of PT, agreeable to evaluation at this time. Prior to admission the pt was living alone, mobilizing independently, and completing ADLs without assist. The pt does report no falls in last 6 months, but also admits to use of furniture for assist with mobility in the home. The pt now presents with limitations in functional mobility, strength, power, endurance, and stability due to above dx, and will continue to benefit from skilled PT to address these deficits. The pt required modA for bed mobility and OOB transfers at this time, and will continue to benefit from skilled PT to progress her strength, power, and stability in order to complete these movements without assist. Furthermore, the pt was limited to ~20 ft ambulation with single UE support and minA, and will benefit from continued skilled PT and general mobility to increase activity tolerance and independence prior to d/c. The pt would prefer return home at this time, but due to lack of support or assist at home, I recommend short-term SNF rehab prior to return to living alone pending the pt's progress with therapy.   5X Sit-to-Stand:  82 sec (> 11.4 sec indicates increased risk of falls for individuals aged 60-69, > 15 sec indicates increased risk of recurrent falls)     Follow Up Recommendations SNF (vs HHPT pending progression)    Equipment Recommendations  Rolling walker with 5" wheels    Recommendations for Other Services       Precautions / Restrictions Precautions Precautions:  Fall Precaution Comments: pt reports no other falls in 6 months Restrictions Weight Bearing Restrictions: No      Mobility  Bed Mobility Overal bed mobility: Needs Assistance Bed Mobility: Supine to Sit     Supine to sit: Mod assist     General bed mobility comments: modA to pull trunk to sitting EOB, no assist needed for BLE. heavy use of bed rails, extra time  Transfers Overall transfer level: Needs assistance Equipment used: None Transfers: Sit to/from Stand Sit to Stand: Min assist         General transfer comment: minG to minA to complete through session. Pt able to power up with use of BUE, but sometimes needed minA to generate momentum and steady in stance.  Ambulation/Gait Ambulation/Gait assistance: Min Web designer (Feet): 20 Feet Assistive device: IV Pole Gait Pattern/deviations: Step-through pattern;Decreased stride length;Scissoring;Trunk flexed Gait velocity: decreased Gait velocity interpretation: <1.8 ft/sec, indicate of risk for recurrent falls General Gait Details: pt with slow gait, reaching for UE support through room (pt reoprts she does this at home normally). no LOBV during ambulation, but increased need for UE support compared to reported baseline         Balance Overall balance assessment: Needs assistance Sitting-balance support: Single extremity supported;Feet supported Sitting balance-Leahy Scale: Fair Sitting balance - Comments: minA initially, progressed to minG once pt adjusted herself at EOB Postural control: Right lateral lean Standing balance support: Bilateral upper extremity supported;During functional activity Standing balance-Leahy Scale: Poor Standing balance comment: reliant on BUE support with gait, able to static stand  with single UE support                             Pertinent Vitals/Pain Pain Assessment: 0-10 Pain Score: 7  Pain Descriptors / Indicators: Grimacing;Throbbing Pain Intervention(s):  Limited activity within patient's tolerance;Monitored during session;Repositioned    Home Living Family/patient expects to be discharged to:: Private residence Living Arrangements: Alone Available Help at Discharge:  (pt reports no one to assist or check on her) Type of Home: Apartment Home Access: Stairs to enter Entrance Stairs-Rails: None Entrance Stairs-Number of Steps: 3 Home Layout: One level Home Equipment: Shower seat;Cane - quad;Grab bars - tub/shower;Grab bars - toilet;Hand held shower head      Prior Function Level of Independence: Independent         Comments: pt reports independence with all mobility and ADLs, no other falls in last 6 months. has a cousin that takes her to run errands when needed     Hand Dominance   Dominant Hand: Right    Extremity/Trunk Assessment   Upper Extremity Assessment Upper Extremity Assessment: Defer to OT evaluation    Lower Extremity Assessment Lower Extremity Assessment: Overall WFL for tasks assessed       Communication   Communication: No difficulties  Cognition Arousal/Alertness: Awake/alert Behavior During Therapy: WFL for tasks assessed/performed;Flat affect Overall Cognitive Status: Within Functional Limits for tasks assessed                                 General Comments: pt with flat affect but cooperative and agreeable through session. conversationally [facility]      General Comments      Exercises     Assessment/Plan    PT Assessment Patient needs continued PT services  PT Problem List Decreased mobility;Decreased activity tolerance;Decreased balance;Pain       PT Treatment Interventions DME instruction;Therapeutic exercise;Gait training;Balance training;Stair training;Functional mobility training;Therapeutic activities;Patient/family education    PT Goals (Current goals can be found in the Care Plan section)  Acute Rehab PT Goals Patient Stated Goal: return to living independently at  home PT Goal Formulation: With patient Time For Goal Achievement: 03/22/20 Potential to Achieve Goals: Good    Frequency Min 3X/week   Barriers to discharge Decreased caregiver support pt from home alone, states she does not have anyone who could stay with her for short term following d/c.       AM-PAC PT "6 Clicks" Mobility  Outcome Measure Help needed turning from your back to your side while in a flat bed without using bedrails?: A Little Help needed moving from lying on your back to sitting on the side of a flat bed without using bedrails?: A Lot Help needed moving to and from a bed to a chair (including a wheelchair)?: A Little Help needed standing up from a chair using your arms (e.g., wheelchair or bedside chair)?: A Little Help needed to walk in hospital room?: A Little Help needed climbing 3-5 steps with a railing? : A Lot 6 Click Score: 16    End of Session Equipment Utilized During Treatment: Gait belt Activity Tolerance: Patient tolerated treatment well;Patient limited by fatigue Patient left: in chair;with call bell/phone within reach Nurse Communication: Mobility status PT Visit Diagnosis: Difficulty in walking, not elsewhere classified (R26.2);Pain Pain - part of body:  (groin (incision site))    Time: 0827-0904 PT Time Calculation (min) (ACUTE ONLY):  37 min   Charges:   PT Evaluation $PT Eval Moderate Complexity: 1 Mod PT Treatments $Gait Training: 8-22 mins $Therapeutic Activity: 8-22 mins        [name], PT, DPT   Acute Rehabilitation Department Pager #: [phone]  Otho Bellows 03/08/2020, 10:25 AM

## 2020-03-08 NOTE — Evaluation (Signed)
Occupational Therapy Evaluation Patient Details Name: [name] MRN: [id number] DOB: 1951-03-03 Today's Date: 03/08/2020    History of Present Illness The pt is a 69 yo female presenting s/p I&D of perineum and pannus abcess on 9/2. She presented to the ED after a fall due to severe levels of pain from her abcesses. PMH includes: COPD, DM II, HLD< HTN, NSTEMI, renal insufficiency, and obesity.   Clinical Impression   Patient received in recliner.  Patient had been sitting for 2 hours, agreed to back to bed and side lying.  Patient presents with pain to buttocks/groin area after surgery, declines to functional mobility compared to baseline, balance deficits, assist with RW management and declines to especially LB ADL.  Patient lived alone, and other than assist for community mobility, she was independent with all care and mobility without an assistive device.  Patient plans on returning to her apartment, assist from family and friends and HH OT/PT.  Continue to follow in acute to maximize functional status.      Follow Up Recommendations  Home health OT    Equipment Recommendations  Other (comment) (2 wrw, wide ? wide BSC if patient agrees.)    Recommendations for Other Services       Precautions / Restrictions Precautions Precautions: Fall Precaution Comments: pt reports no other falls in 6 months Restrictions Weight Bearing Restrictions: No Other Position/Activity Restrictions: discussed limiting sitting to 2 hours and offloading in bed side to side.      Mobility Bed Mobility Overal bed mobility: Needs Assistance Bed Mobility: Sit to Supine     Supine to sit: Mod assist Sit to supine: Mod assist   General bed mobility comments: modA to pull trunk to sitting EOB, no assist needed for BLE. heavy use of bed rails, extra time  Transfers Overall transfer level: Needs assistance Equipment used: None Transfers: Sit to/from Stand Sit to Stand: Mod assist          General transfer comment: minG to minA to complete through session. Pt able to power up with use of BUE, but sometimes needed minA to generate momentum and steady in stance.    Balance Overall balance assessment: Needs assistance Sitting-balance support: Single extremity supported;Feet supported Sitting balance-Leahy Scale: Fair Sitting balance - Comments: minA initially, progressed to minG once pt adjusted herself at EOB Postural control: Right lateral lean Standing balance support: Bilateral upper extremity supported Standing balance-Leahy Scale: Fair Standing balance comment: with RW                           ADL either performed or assessed with clinical judgement   ADL Overall ADL's : Needs assistance/impaired Eating/Feeding: Set up;Sitting   Grooming: Wash/dry hands;Wash/dry face;Set up;Sitting   Upper Body Bathing: Minimal assistance;Sitting   Lower Body Bathing: Maximal assistance;Sit to/from stand Lower Body Bathing Details (indicate cue type and reason): pain associated with surgery. Upper Body Dressing : Moderate assistance;Sitting Upper Body Dressing Details (indicate cue type and reason): uncomfortable to sit EOB on surgical area Lower Body Dressing: Maximal assistance;Sit to/from Health and safety inspector Details (indicate cue type and reason): NT   Toileting - Clothing Manipulation Details (indicate cue type and reason): NT     Functional mobility during ADLs: Min guard;Minimal assistance;Rolling walker General ADL Comments: Mod A for sit to stand from recliner     Vision Baseline Vision/History: Wears glasses Patient Visual Report: No change from baseline  Pertinent Vitals/Pain Pain Score: 7  Pain Location: buttocks/groin Pain Descriptors / Indicators: Aching;Grimacing;Guarding;Tender;Throbbing Pain Intervention(s): Monitored during session;Repositioned     Hand Dominance Right   Extremity/Trunk Assessment Upper  Extremity Assessment Upper Extremity Assessment: Overall WFL for tasks assessed   Lower Extremity Assessment Lower Extremity Assessment: Defer to PT evaluation   Cervical / Trunk Assessment Cervical / Trunk Assessment: Normal   Communication Communication Communication: No difficulties   Cognition Arousal/Alertness: Awake/alert Behavior During Therapy: WFL for tasks assessed/performed Overall Cognitive Status: Within Functional Limits for tasks assessed                                     General Comments  soaker pad on recliner saturated with serous sanguineous drainage.    Exercises     Shoulder Instructions      Home Living Family/patient expects to be discharged to:: Private residence Living Arrangements: Alone Available Help at Discharge: Friend(s) Type of Home: Apartment Home Access: Stairs to enter   Entrance Stairs-Rails: None Home Layout: One level     Bathroom Shower/Tub: Occupational psychologist: Handicapped height Bathroom Accessibility: Yes How Accessible: Accessible via walker Home Equipment: Shower seat;Cane - quad;Grab bars - tub/shower;Grab bars - toilet;Hand held shower head          Prior Functioning/Environment Level of Independence: Independent        Comments: relies on family for community mobility.        OT Problem List: Decreased activity tolerance;Impaired balance (sitting and/or standing);Decreased knowledge of use of DME or AE;Pain      OT Treatment/Interventions:      OT Goals(Current goals can be found in the care plan section) Acute Rehab OT Goals Patient Stated Goal: to get comfortable and be able to move like I did before. OT Goal Formulation: With patient Time For Goal Achievement: 03/22/20 Potential to Achieve Goals: Good ADL Goals Pt Will Perform Grooming: with set-up;with supervision;standing Pt Will Perform Upper Body Bathing: with set-up;sitting Pt Will Perform Lower Body Bathing: with  set-up;with supervision;with min guard assist;with min assist;sit to/from stand Pt Will Perform Upper Body Dressing: with set-up;with supervision;standing Pt Will Perform Lower Body Dressing: with supervision;with min guard assist;with min assist;sit to/from stand;with adaptive equipment Pt Will Transfer to Toilet: with set-up;with supervision;regular height toilet  OT Frequency: Min 2X/week   Barriers to D/C:    wound healing                     AM-PAC OT "6 Clicks" Daily Activity     Outcome Measure Help from another person eating meals?: None Help from another person taking care of personal grooming?: None Help from another person toileting, which includes using toliet, bedpan, or urinal?: A Little Help from another person bathing (including washing, rinsing, drying)?: A Lot Help from another person to put on and taking off regular upper body clothing?: A Little Help from another person to put on and taking off regular lower body clothing?: A Lot 6 Click Score: 18   End of Session Equipment Utilized During Treatment: Gait belt;Rolling walker  Activity Tolerance: Patient limited by pain Patient left: in bed;with call bell/phone within reach;with bed alarm set  OT Visit Diagnosis: Unsteadiness on feet (R26.81);Other abnormalities of gait and mobility (R26.89);Pain Pain - part of body:  (groin area)  Time: 5183-3582 OT Time Calculation (min): 21 min Charges:  OT General Charges $OT Visit: 1 Visit OT Evaluation $OT Eval Moderate Complexity: 1 Mod  03/08/2020  Rich, OTR/L  Acute Rehabilitation Services  Office:  [phone]   Metta Clines 03/08/2020, 1:26 PM

## 2020-03-08 NOTE — Progress Notes (Addendum)
[facility] Progress Note  1 Day Post-Op  Subjective: CC-  Up in chair. Sore from surgery but overall feeling ok. Denies abdominal pain, nausea, vomiting. Tolerating diet. WBC remains elevated at 27.9, afebrile.  Objective: Vital signs in last 24 hours: Temp:  [97.4 F (36.3 C)-98 F (36.7 C)] 97.5 F (36.4 C) (09/03 0813) Pulse Rate:  [70-74] 71 (09/03 0813) Resp:  [15-20] 17 (09/03 0813) BP: (109-130)/(52-63) 123/53 (09/03 0813) SpO2:  [99 %-100 %] 100 % (09/03 0813) Last BM Date:  (UTA)  Intake/Output from previous day: 09/02 0701 - 09/03 0700 In: 677.9 [I.V.:400; IV Piggyback:277.9] Out: 825 [Urine:750; Blood:75] Intake/Output this shift: No intake/output data recorded.  PE: Gen:  Alert, NAD, pleasant HEENT: EOM's intact, pupils equal and round Pulm:  rate and effort normal Abd: Soft, NT/ND, +BS Skin: warm and dry. Right thigh/groin wounds: -Posterior wounds: 2 openings, both with some purulent drainage, no cellulitis or induration noted. The larger of the 2 wounds tracks anteriorly   -Anterior wound: clean without erythema or purulent drainage     Lab Results:  Recent Labs    03/07/20 0414 03/08/20 0319  WBC 27.8* 27.9*  HGB 7.8* 7.4*  HCT 24.2* 23.4*  PLT 184 198   BMET Recent Labs    03/07/20 0414 03/08/20 0319  NA 140 141  K 4.3 4.6  CL 110 109  CO2 19* 18*  GLUCOSE 282* 419*  BUN 91* 71*  CREATININE 2.17* 2.07*  CALCIUM 9.0 8.6*   PT/INR No results for input(s): LABPROT, INR in the last 72 hours. CMP     Component Value Date/Time   NA 141 03/08/2020 0319   NA 138 03/29/2018 1042   K 4.6 03/08/2020 0319   CL 109 03/08/2020 0319   CO2 18 (L) 03/08/2020 0319   GLUCOSE 419 (H) 03/08/2020 0319   BUN 71 (H) 03/08/2020 0319   BUN 41 (H) 03/29/2018 1042   CREATININE 2.07 (H) 03/08/2020 0319   CREATININE 2.16 (H) 01/13/2017 1146   CALCIUM 8.6 (L) 03/08/2020 0319   PROT 7.8 01/10/2019 1018   PROT 6.6 03/29/2018 1042    ALBUMIN 4.4 01/10/2019 1018   ALBUMIN 4.2 03/29/2018 1042   AST 12 01/10/2019 1018   ALT 12 01/10/2019 1018   ALKPHOS 112 01/10/2019 1018   BILITOT 0.4 01/10/2019 1018   BILITOT 0.2 03/29/2018 1042   GFRNONAA 24 (L) 03/08/2020 0319   GFRNONAA 23 (L) 01/13/2017 1146   GFRAA 28 (L) 03/08/2020 0319   GFRAA 27 (L) 01/13/2017 1146   Lipase  No results found for: LIPASE     Studies/Results: CT ABDOMEN PELVIS WO CONTRAST  Addendum Date: 03/06/2020   ADDENDUM REPORT: 03/06/2020 21:05 ADDENDUM: Critical Value/emergent results were called by telephone at the time of interpretation on 03/06/2020 at 9:05 pm to provider Sharlet Salina NP , who verbally acknowledged these results. Electronically Signed   By: Lovena Le M.D.   On: 03/06/2020 21:05   Result Date: 03/06/2020 CLINICAL DATA:  Abdominal pain, abdominal and groin cellulitis EXAM: CT ABDOMEN AND PELVIS WITHOUT CONTRAST TECHNIQUE: Multidetector CT imaging of the abdomen and pelvis was performed following the standard protocol without IV contrast. COMPARISON:  None. FINDINGS: Lower chest: Lung bases are clear. Cardiac size at the upper limits normal. Coronary artery calcifications are present. Suspect calcification of the mitral annulus as well. Hypoattenuation of the cardiac blood pool may reflect a mild anemia. Hepatobiliary: No visible concerning liver lesion within the limitations of this unenhanced of motion degraded  CT. Smooth liver surface contour. Normal liver attenuation. Gallbladder is unremarkable. Question some biliary ductal dilatation difficult to fully discern given the lack of contrast media and extensive motion artifact. The extrahepatic common bile duct may measure up to 1.5 cm at the level of the pancreatic head. No visible intraductal gallstones. Pancreas: Poorly assessed given extensive motion artifact photon starvation. No pancreatic ductal dilatation or surrounding inflammatory changes. Spleen: Normal in size. No concerning splenic  lesions. Splenic hilar vascular calcifications are noted. Adrenals/Urinary Tract: Adrenal glands are unremarkable. Kidneys are symmetric in size and normally located. Question some mild perinephric stranding versus volume averaging in the setting of motion artifact. No visible concerning renal lesion. No urolithiasis or hydronephrosis. Urinary bladder is unremarkable. Stomach/Bowel: Small sliding-type hiatal hernia. Distal stomach and duodenum are unremarkable. No small or large bowel thickening or dilatation. No evidence of bowel obstruction. Appendix is not visualized. No focal inflammation the vicinity of the cecum to suggest an occult appendicitis. Vascular/Lymphatic: Atherosclerotic calcifications within the abdominal aorta and branch vessels. No aneurysm or ectasia. No enlarged abdominopelvic lymph nodes. Likely reactive adenopathy seen in the inguinal regions. Reproductive: Anteverted uterus. Question endometrial thickening, greater than expected for patient age measuring up to 10 mm in struck thickness. No concerning adnexal lesions. Few parametrial calcifications are atypical senescent finding Other: There is extensive soft tissue stranding and thickening extending across the low anterior abdominal wall/panniculus. Large lobular phlegmon in the right groin/pudendal tissues superficial to the right adductor musculature and right inferior pubic ramus anteriorly with small amount of soft tissue gas. Is region is difficult to accurately measure given the lobular margins of med does extend up to 4.3 x 6.2 cm in maximal transaxial dimensions. Organized collection/abscess formation is difficult to ascertain in the absence of contrast media. Musculoskeletal: Soft tissue thickening and edematous changes of the adductor compartment of the right thigh could reflect some reactive or inflammatory myositis. No discernible intramuscular collection is seen. No acute or suspicious osseous lesions. Multilevel degenerative  changes in the spine, hips and pelvis. Mild grade 1 anterolisthesis L4 on 5. No spondylolysis is evident. IMPRESSION: 1. Extensive soft tissue stranding and thickening extending across the low anterior abdominal wall/panniculus with a large, heterogeneous phlegmon within the deep space of the right groin and pudendal soft tissues along the right adductor musculature and right inferior pubic ramus with small amount of soft tissue gas. Organized collection/abscess formation is difficult to ascertain in the absence of contrast media. Findings are concerning for severe cellulitis and aggressive soft tissue infection. Emergent surgical consultation is warranted. 2. Soft tissue thickening and edematous changes of the adductor compartment of the right thigh could reflect some reactive or inflammatory myositis. No discernible intramuscular collection is seen. 3. Question endometrial thickening, greater than expected for patient age measuring up to 10 mm in thickness. Recommend further evaluation with outpatient pelvic ultrasound when patient is able to better tolerate. 4. Aortic Atherosclerosis (ICD10-I70.0). Currently attempting to contact the ordering provider with a critical value result. Addendum will be submitted upon case discussion. Electronically Signed: By: Lovena Le M.D. On: 03/06/2020 21:02   CT HEAD WO CONTRAST  Result Date: 03/06/2020 CLINICAL DATA:  Mental status change and cause EXAM: CT HEAD WITHOUT CONTRAST TECHNIQUE: Contiguous axial images were obtained from the base of the skull through the vertex without intravenous contrast. COMPARISON:  CT head 06/06/2013 FINDINGS: Brain: No evidence of acute infarction, hemorrhage, hydrocephalus, extra-axial collection or mass lesion/mass effect. Symmetric prominence of the ventricles, cisterns and sulci compatible  with parenchymal volume loss. Patchy areas of white matter hypoattenuation are most compatible with chronic microvascular angiopathy. Vascular:  Atherosclerotic calcification of the carotid siphons0 and left intradural vertebral artery. No worrisome hyperdense vessel. Skull: No scalp swelling or calvarial fracture. No acute or suspicious osseous lesions Sinuses/Orbits: Minimal thickening in the ethmoid air cells. Remaining paranasal sinuses and slightly hypo pneumatized mastoid air cells are predominantly clear. Included orbital structures are unremarkable. Other: Debris noted in the left external auditory canal. IMPRESSION: 1. No acute intracranial findings. 2. Mild parenchymal volume loss and chronic microvascular angiopathy. 3. Debris in the left external auditory canal, correlate for cerumen impaction. Electronically Signed   By: Lovena Le M.D.   On: 03/06/2020 20:46   DG Chest Port 1 View  Result Date: 03/06/2020 CLINICAL DATA:  Diabetic ketoacidosis EXAM: PORTABLE CHEST 1 VIEW COMPARISON:  04/13/2016 FINDINGS: Minimal left basilar opacity. No pleural effusion. Stable cardiomediastinal silhouette with aortic atherosclerosis. No pneumothorax. IMPRESSION: Minimal left basilar opacity, atelectasis versus mild pneumonia. Electronically Signed   By: Donavan Foil M.D.   On: 03/06/2020 17:28    Anti-infectives: Anti-infectives (From admission, onward)   Start     Dose/Rate Route Frequency Ordered Stop   03/07/20 1430  ceFAZolin (ANCEF) IVPB 2g/100 mL premix        2 g 200 mL/hr over 30 Minutes Intravenous On call to O.R. 03/07/20 1415 03/07/20 1625   03/07/20 0300  cefTRIAXone (ROCEPHIN) 1 g in sodium chloride 0.9 % 100 mL IVPB  Status:  Discontinued        1 g 200 mL/hr over 30 Minutes Intravenous Every 24 hours 03/06/20 0438 03/06/20 2214   03/06/20 2245  vancomycin (VANCOCIN) IVPB 1000 mg/200 mL premix        1,000 mg 200 mL/hr over 60 Minutes Intravenous Every 24 hours 03/06/20 2233     03/06/20 2230  piperacillin-tazobactam (ZOSYN) IVPB 3.375 g        3.375 g 12.5 mL/hr over 240 Minutes Intravenous Every 8 hours 03/06/20 2226      03/06/20 2215  piperacillin-tazobactam (ZOSYN) IVPB 4.5 g  Status:  Discontinued        4.5 g 200 mL/hr over 30 Minutes Intravenous Every 8 hours 03/06/20 2214 03/06/20 2224   03/06/20 0215  cefTRIAXone (ROCEPHIN) 1 g in sodium chloride 0.9 % 100 mL IVPB        1 g 200 mL/hr over 30 Minutes Intravenous  Once 03/06/20 0214 03/06/20 0336       Assessment/Plan DM CKD4 HTN ICM Obesity BMI 32.68 UTI  Right perineal abscess complex multiloculated S/p Incision drainage right perineal abscess complex 9/2 Dr. Brantley Stage - POD #1 - culture pending - WBC remains elevated at 27.9, afebrile - Start BID wet to dry dressing changes. There are 3 openings that need to be packed. Some purulence from the 2 most posterior wounds today, monitor. Shower daily with wounds open. Continue IV antibiotics and follow culture.   ID - zosyn 9/1>>, vancomcyin 9/1>> FEN - HH/CM diet VTE - SCDs, sq heparin Foley - in place Follow up - DOW vs wound clinic   LOS: 2 days    Wellington Hampshire, [facility] 03/08/2020, 9:58 AM Please see Amion for pager number during day hours 7:00am-4:30pm

## 2020-03-08 NOTE — Progress Notes (Deleted)
Discharge package printed and instructions given to patient. Verbalizes understanding.  

## 2020-03-08 NOTE — Progress Notes (Signed)
Inpatient Diabetes Program Recommendations  AACE/ADA: New Consensus Statement on Inpatient Glycemic Control (2015)  Target Ranges:  Prepandial:   less than 140 mg/dL      Peak postprandial:   less than 180 mg/dL (1-2 hours)      Critically ill patients:  140 - 180 mg/dL   Lab Results  Component Value Date   GLUCAP 462 (H) 03/08/2020   HGBA1C 7.2 (A) 01/10/2020    Review of Glycemic Control Results for [name], [name] (MRN [id number]) as of 03/08/2020 16:01  Ref. Range 03/07/2020 14:29 03/07/2020 16:37 03/07/2020 20:56 03/08/2020 06:39 03/08/2020 11:44  Glucose-Capillary Latest Ref Range: 70 - 99 mg/dL 288 (H) 349 (H) 365 (H) 378 (H) 462 (H)    Current orders for Inpatient glycemic control:  Lantus 15 units daily   Inpatient Diabetes Program Recommendations:     Insulin dosing was off by 2 hrs this morning.  Noon cbg was 462.  Novolog 4-5 units tid with meals if eats at least 50% of meal if post prandials remain elevated.  Will continue to follow while inpatient.  Thank you, Reche Dixon, RN, BSN Diabetes Coordinator Inpatient Diabetes Program [phone] (team pager from 8a-5p)

## 2020-03-08 NOTE — Progress Notes (Signed)
PROGRESS NOTE  [name] GMW:[id number] DOB: [birth year] DOA: 03/05/2020 PCP: [name] Anger, MD  HPI/Recap of past 24 hours: HPI from Dr [name] is a 69 y.o. 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 Pt in to ED with EMS after laying on floor all night following a fall at home.  Pt states she is in so much pain from multiple "abscesses in her groin area" that she fell.  Pt hasnt taken any meds in a week it seems.  CBG reading high. ED Course: WBC 31k, has cellulitis under abdomen and groin area, no drainable abscesses per EDP.  Also has UTI.  Creat 2.7 which is about baseline, BUN of 130 which is not. Bicarb of 16, anion gap of 16 and BHB of 1.98. BGL 674. Pt started on insulin gtt.  Given 1L LR bolus.  Patient admitted for further management.    Today, patient denies any new complaints, just postop pain around groin and abdomen.  Denies any shortness of breath, chest pain, nausea/vomiting, fever/chills.     Assessment/Plan: Principal Problem:   Cellulitis Active Problems:   Controlled type 2 diabetes mellitus with circulatory disorder, without long-term current use of insulin (HCC)   Hypertension associated with diabetes ([place])   Chronic combined systolic and diastolic CHF (congestive heart failure) ([place])   CKD stage 4 due to type 2 diabetes mellitus ([place])   Hyperglycemia   Acute lower UTI   DKA, type 2 (HCC)   Sepsis ([place])   Sepsis 2/2 perineal abscesses with widespread cellulitis of the pannus, right groin region s/p I&D on 03/07/2020 On admission, noted leukocytosis, tachypnea, lactic acidosis Currently afebrile, with leukocytosis BC x2 NGTD Deep surgical wound culture pending Chest x-ray showed minimal left basilar opacity, likely atelectasis versus mild pneumonia CT abdomen/pelvis showed extensive soft tissue stranding and thickening extending across the lower anterior abdominal wall, concerning for  severe cellulitis and aggressive soft tissue infection General surgery consulted, further wound care as per general surgery Continue vancomycin, Zosyn Monitor closely  DKA/DM type 2 A1c on 01/10/2020 was 7.2 S/p insulin drip, with improvement in CBG and closed anion gap, although still remains acidotic which could likely be from worsening kidney function/sepsis SSI, Lantus, NovoLog 3 times daily, Accu-Cheks, hypoglycemic protocol Monitor closely  Enterococcus faecalis UTI UA with moderate hemoglobin, large leukocytes, few bacteria, WBC 21-50 UC grew 60,000 E faecalis Continue vancomycin  Hypertension BP stable Continue to hold home Bystolic, amlodipine, hydralazine for now  CKD stage IV/acidosis Creatinine at baseline Hold home torsemide for now Strict I's and O's Monitor closely  Anemia of chronic kidney disease Baseline hemoglobin 8-10 Anemia panel pending No evidence of bleeding Daily CBC  Chronic diastolic HF/history of ischemic cardiomyopathy History of stent placement Echo done on 05/2019 showed EF of 60 to 44%, grade 2 diastolic dysfunction Monitor closely  Obesity Lifestyle modification advised        Malnutrition Type:  Nutrition Problem: Increased nutrient needs Etiology: post-op healing   Malnutrition Characteristics:  Signs/Symptoms: estimated needs   Nutrition Interventions:  Interventions: MVI, Premier Protein, Juven    Estimated body mass index is 32.68 kg/m as calculated from the following:   Height as of this encounter: 5\' 4"  (1.626 m).   Weight as of this encounter: 86.4 kg.     Code Status: Full  Family Communication: Discussed with patient  Disposition Plan: Status is: Inpatient  Remains inpatient appropriate because:Inpatient level of care appropriate  due to severity of illness   Dispo: The patient is from: Home              Anticipated d/c is to: Home              Anticipated d/c date is: 3 days              Patient  currently is not medically stable to d/c.    Consultants:  General surgery  Procedures:  I&D on 03/07/2020  Antimicrobials:  Zosyn  Vancomycin  DVT prophylaxis:  Heparin   Objective: Vitals:   03/07/20 1747 03/07/20 2200 03/08/20 0450 03/08/20 0813  BP: (!) 124/58 (!) 111/52 126/63 (!) 123/53  Pulse: 70 70 74 71  Resp:  17 17 17   Temp: 97.6 F (36.4 C) (!) 97.4 F (36.3 C) 97.9 F (36.6 C) (!) 97.5 F (36.4 C)  TempSrc:  Oral Oral Oral  SpO2: 99% 100% 100% 100%  Weight:      Height:        Intake/Output Summary (Last 24 hours) at 03/08/2020 1638 Last data filed at 03/08/2020 1500 Gross per 24 hour  Intake 880 ml  Output 1300 ml  Net -420 ml   Filed Weights   03/06/20 2101  Weight: 86.4 kg    Exam:  General: NAD, awake, alert, oriented  Cardiovascular: S1, S2 present  Respiratory: CTAB  Abdomen: Soft, +tender, nondistended, bowel sounds present  Musculoskeletal: No bilateral pedal edema noted  Skin:  Please see picture from general surgery progress note  Psychiatry: Normal mood    Data Reviewed: CBC: Recent Labs  Lab 03/05/20 1429 03/06/20 0319 03/06/20 0603 03/07/20 0414 03/08/20 0319  WBC 31.7*  --  27.7* 27.8* 27.9*  NEUTROABS  --   --   --  24.7* 25.8*  HGB 8.6* 10.9* 8.1* 7.8* 7.4*  HCT 28.5* 32.0* 26.6* 24.2* 23.4*  MCV 93.1  --  91.4 88.0 90.3  PLT 193  --  192 184 202   Basic Metabolic Panel: Recent Labs  Lab 03/06/20 1433 03/06/20 2216 03/07/20 0059 03/07/20 0414 03/08/20 0319  NA 139 139 138 140 141  K 4.1 4.3 4.7 4.3 4.6  CL 108 109 107 110 109  CO2 18* 16* 18* 19* 18*  GLUCOSE 165* 256* 280* 282* 419*  BUN 100* 94* 99* 91* 71*  CREATININE 2.20* 2.15* 2.10* 2.17* 2.07*  CALCIUM 9.2 9.1 8.9 9.0 8.6*   [name]: Estimated Creatinine Clearance: 27.3 mL/min (A) (by C-G formula based on SCr of 2.07 mg/dL (H)). Liver Function Tests: No results for input(s): AST, ALT, ALKPHOS, BILITOT, PROT, ALBUMIN in the last 168  hours. No results for input(s): LIPASE, AMYLASE in the last 168 hours. No results for input(s): AMMONIA in the last 168 hours. Coagulation Profile: No results for input(s): INR, PROTIME in the last 168 hours. Cardiac Enzymes: Recent Labs  Lab 03/05/20 1429  CKTOTAL 104   BNP (last 3 results) No results for input(s): PROBNP in the last 8760 hours. HbA1C: No results for input(s): HGBA1C in the last 72 hours. CBG: Recent Labs  Lab 03/07/20 1637 03/07/20 2056 03/08/20 0639 03/08/20 1144 03/08/20 1620  GLUCAP 349* 365* 378* 462* 308*   Lipid Profile: No results for input(s): CHOL, HDL, LDLCALC, TRIG, CHOLHDL, LDLDIRECT in the last 72 hours. Thyroid Function Tests: No results for input(s): TSH, T4TOTAL, FREET4, T3FREE, THYROIDAB in the last 72 hours. Anemia Panel: No results for input(s): VITAMINB12, FOLATE, FERRITIN, TIBC, IRON, RETICCTPCT in the last  72 hours. Urine analysis:    Component Value Date/Time   COLORURINE YELLOW 03/05/2020 2040   APPEARANCEUR CLOUDY (A) 03/05/2020 2040   LABSPEC 1.010 03/05/2020 2040   PHURINE 5.0 03/05/2020 2040   GLUCOSEU >=500 (A) 03/05/2020 2040   GLUCOSEU NEGATIVE 10/03/2013 1004   HGBUR MODERATE (A) 03/05/2020 2040   BILIRUBINUR NEGATIVE 03/05/2020 2040   KETONESUR NEGATIVE 03/05/2020 2040   PROTEINUR NEGATIVE 03/05/2020 2040   UROBILINOGEN 0.2 10/03/2013 1004   NITRITE NEGATIVE 03/05/2020 2040   LEUKOCYTESUR LARGE (A) 03/05/2020 2040   Sepsis Labs: @LABRCNTIP (procalcitonin:4,lacticidven:4)  ) Recent Results (from the past 240 hour(s))  SARS Coronavirus 2 by RT PCR (hospital order, performed in [facility] hospital lab) Nasopharyngeal Nasopharyngeal Swab     Status: None   Collection Time: 03/06/20  3:06 AM   Specimen: Nasopharyngeal Swab  Result Value Ref Range Status   SARS Coronavirus 2 NEGATIVE NEGATIVE Final    Comment: (NOTE) SARS-CoV-2 target nucleic acids are NOT DETECTED.  The SARS-CoV-2 RNA is generally detectable  in upper and lower respiratory specimens during the acute phase of infection. The lowest concentration of SARS-CoV-2 viral copies this assay can detect is 250 copies / mL. A negative result does not preclude SARS-CoV-2 infection and should not be used as the sole basis for treatment or other patient management decisions.  A negative result may occur with improper specimen collection / handling, submission of specimen other than nasopharyngeal swab, presence of viral mutation(s) within the areas targeted by this assay, and inadequate number of viral copies (<250 copies / mL). A negative result must be combined with clinical observations, patient history, and epidemiological information.  Fact Sheet for Patients:   StrictlyIdeas.no  Fact Sheet for Healthcare Providers: BankingDealers.co.za  This test is not yet approved or  cleared by the [place] FDA and has been authorized for detection and/or diagnosis of SARS-CoV-2 by FDA under an Emergency Use Authorization (EUA).  This EUA will remain in effect (meaning this test can be used) for the duration of the COVID-19 declaration under Section 564(b)(1) of the Act, 21 U.S.C. section 360bbb-3(b)(1), unless the authorization is terminated or revoked sooner.  Performed at [facility], [place] [address], [place], [place] [address]   Urine culture     Status: Abnormal   Collection Time: 03/06/20  4:50 AM   Specimen: Urine, Random  Result Value Ref Range Status   Specimen Description URINE, RANDOM  Final   Special Requests   Final    NONE Performed at [facility], [place] [address] [place], [place] [address]    Culture 60,000 COLONIES/mL ENTEROCOCCUS FAECALIS (A)  Final   Report Status 03/08/2020 FINAL  Final   Organism ID, Bacteria ENTEROCOCCUS FAECALIS (A)  Final      Susceptibility   Enterococcus faecalis - MIC*    AMPICILLIN <=2 SENSITIVE Sensitive     NITROFURANTOIN <=16  SENSITIVE Sensitive     VANCOMYCIN 1 SENSITIVE Sensitive     * 60,000 COLONIES/mL ENTEROCOCCUS FAECALIS  Culture, blood (routine x 2)     Status: None (Preliminary result)   Collection Time: 03/06/20  6:00 AM   Specimen: BLOOD RIGHT FOREARM  Result Value Ref Range Status   Specimen Description BLOOD RIGHT FOREARM  Final   Special Requests   Final    BOTTLES DRAWN AEROBIC AND ANAEROBIC Blood Culture adequate volume   Culture   Final    NO GROWTH 2 DAYS Performed at [facility], [address] [place], [place] [address]    Report Status PENDING  Incomplete  Culture, blood (routine x 2)     Status: None (Preliminary result)   Collection Time: 03/06/20  8:33 AM   Specimen: BLOOD LEFT HAND  Result Value Ref Range Status   Specimen Description BLOOD LEFT HAND  Final   Special Requests   Final    BOTTLES DRAWN AEROBIC ONLY Blood Culture results may not be optimal due to an inadequate volume of blood received in culture bottles   Culture   Final    NO GROWTH 2 DAYS Performed at [facility], [place] [address], [place], [place] [address]    Report Status PENDING  Incomplete  MRSA PCR Screening     Status: None   Collection Time: 03/06/20 10:07 PM   Specimen: Nasal Mucosa; Nasopharyngeal  Result Value Ref Range Status   MRSA by PCR NEGATIVE NEGATIVE Final    Comment:        The GeneXpert MRSA Assay (FDA approved for NASAL specimens only), is one component of a comprehensive MRSA colonization surveillance program. It is not intended to diagnose MRSA infection nor to guide or monitor treatment for MRSA infections. Performed at [facility], [place] [address], [place], [place] [address]   Aerobic/Anaerobic Culture (surgical/deep wound)     Status: None (Preliminary result)   Collection Time: 03/07/20  4:07 PM   Specimen: PATH Other; Tissue  Result Value Ref Range Status   Specimen Description ABSCESS PERINEAL RIGHT  Final   Special Requests PT ON ANCEF ZOSYN  Final    Gram Stain   Final    FEW WBC PRESENT, PREDOMINANTLY PMN ABUNDANT GRAM NEGATIVE RODS ABUNDANT GRAM POSITIVE COCCI IN PAIRS MODERATE GRAM POSITIVE RODS    Culture   Final    TOO YOUNG TO READ Performed at [facility], [place] [address], [place],  [address]    Report Status PENDING  Incomplete      Studies: No results found.  Scheduled Meds: . aspirin  81 mg Oral Daily  . calcium carbonate  1,250 mg Oral BID WC  . Chlorhexidine Gluconate Cloth  6 each Topical Once  . heparin  5,000 Units Subcutaneous Q8H  . insulin aspart  0-15 Units Subcutaneous TID WC  . insulin aspart  0-5 Units Subcutaneous QHS  . insulin aspart  5 Units Subcutaneous TID WC  . insulin glargine  15 Units Subcutaneous Daily  . multivitamin with minerals  1 tablet Oral Daily  . nutrition supplement (JUVEN)  1 packet Oral BID BM  . pantoprazole  20 mg Oral Daily  . Ensure Max Protein  11 oz Oral QHS  . rosuvastatin  5 mg Oral Daily    Continuous Infusions: . lactated ringers 10 mL/hr at 03/07/20 1431  . piperacillin-tazobactam (ZOSYN)  IV 3.375 g (03/08/20 1402)  . vancomycin 1,000 mg (03/07/20 2138)     LOS: 2 days     Alma Friendly, MD [facility]  If 7PM-7AM, please contact night-coverage [email or web address] 03/08/2020, 4:38 PM

## 2020-03-09 LAB — IRON AND TIBC
Iron: 30 ug/dL (ref 28–170)
Saturation Ratios: 24 % (ref 10.4–31.8)
TIBC: 127 ug/dL — ABNORMAL LOW (ref 250–450)
UIBC: 97 ug/dL

## 2020-03-09 LAB — CBC WITH DIFFERENTIAL/PLATELET
Abs Immature Granulocytes: 0 10*3/uL (ref 0.00–0.07)
Basophils Absolute: 0 10*3/uL (ref 0.0–0.1)
Basophils Relative: 0 %
Eosinophils Absolute: 0 10*3/uL (ref 0.0–0.5)
Eosinophils Relative: 0 %
HCT: 22.5 % — ABNORMAL LOW (ref 36.0–46.0)
Hemoglobin: 7.1 g/dL — ABNORMAL LOW (ref 12.0–15.0)
Lymphocytes Relative: 9 %
Lymphs Abs: 1.8 10*3/uL (ref 0.7–4.0)
MCH: 28.6 pg (ref 26.0–34.0)
MCHC: 31.6 g/dL (ref 30.0–36.0)
MCV: 90.7 fL (ref 80.0–100.0)
Monocytes Absolute: 0.8 10*3/uL (ref 0.1–1.0)
Monocytes Relative: 4 %
Neutro Abs: 17.6 10*3/uL — ABNORMAL HIGH (ref 1.7–7.7)
Neutrophils Relative %: 87 %
Platelets: 200 10*3/uL (ref 150–400)
RBC: 2.48 MIL/uL — ABNORMAL LOW (ref 3.87–5.11)
RDW: 15.8 % — ABNORMAL HIGH (ref 11.5–15.5)
WBC: 20.2 10*3/uL — ABNORMAL HIGH (ref 4.0–10.5)
nRBC: 0 /100 WBC
nRBC: 0.1 % (ref 0.0–0.2)

## 2020-03-09 LAB — FOLATE: Folate: 10.1 ng/mL (ref >5.9)

## 2020-03-09 LAB — BASIC METABOLIC PANEL
Anion gap: 7 (ref 5–15)
BUN: 71 mg/dL — ABNORMAL HIGH (ref 8–23)
CO2: 21 mmol/L — ABNORMAL LOW (ref 22–32)
Calcium: 8.2 mg/dL — ABNORMAL LOW (ref 8.9–10.3)
Chloride: 107 mmol/L (ref 98–111)
Creatinine, Ser: 2.22 mg/dL — ABNORMAL HIGH (ref 0.44–1.00)
GFR calc Af Amer: 25 mL/min — ABNORMAL LOW (ref >60)
GFR calc non Af Amer: 22 mL/min — ABNORMAL LOW (ref >60)
Glucose, Bld: 284 mg/dL — ABNORMAL HIGH (ref 70–99)
Potassium: 4.2 mmol/L (ref 3.5–5.1)
Sodium: 135 mmol/L (ref 135–145)

## 2020-03-09 LAB — GLUCOSE, CAPILLARY
Glucose-Capillary: 331 mg/dL — ABNORMAL HIGH (ref 70–99)
Glucose-Capillary: 317 mg/dL — ABNORMAL HIGH (ref 70–99)
Glucose-Capillary: 146 mg/dL — ABNORMAL HIGH (ref 70–99)
Glucose-Capillary: 158 mg/dL — ABNORMAL HIGH (ref 70–99)

## 2020-03-09 LAB — FERRITIN: Ferritin: 630 ng/mL — ABNORMAL HIGH (ref 11–307)

## 2020-03-09 LAB — VITAMIN B12: Vitamin B-12: 3733 pg/mL — ABNORMAL HIGH (ref 180–914)

## 2020-03-09 LAB — ABO/RH: ABO/RH(D): B POS

## 2020-03-09 MED ORDER — INSULIN GLARGINE 100 UNIT/ML ~~LOC~~ SOLN
15.0000 [IU] | Freq: Two times a day (BID) | SUBCUTANEOUS | Status: DC
  Administered 2020-03-09 – 2020-03-13 (×10): 15 [IU] via SUBCUTANEOUS
  Filled 2020-03-09 (×13): qty 0.15

## 2020-03-09 MED ORDER — INSULIN ASPART 100 UNIT/ML ~~LOC~~ SOLN
8.0000 [IU] | Freq: Three times a day (TID) | SUBCUTANEOUS | Status: DC
  Administered 2020-03-09 – 2020-03-10 (×5): 8 [IU] via SUBCUTANEOUS

## 2020-03-09 NOTE — Plan of Care (Signed)

## 2020-03-09 NOTE — Progress Notes (Signed)
Pharmacy Antibiotic Note  [name] is a 69 y.o. female admitted on 03/05/2020 with necrotizing fascitis.  Pharmacy has been consulted for vancomycin dosing. Patient's renal function has remained relatively consistent since admission. Will plan to continue current antibiotic dosing and reevaluate renal function and levels as needed.   Plan: Continue Vancomycin 1g IV q 24 hrs Continue Zosyn 3.375g IV q 8 hrs - ordered by MD. F/u cultures, renal function and clinical course.  Height: 5\' 4"  (162.6 cm) Weight: 86.4 kg (190 lb 6.4 oz) IBW/kg (Calculated) : 54.7  Temp (24hrs), Avg:98.3 F (36.8 C), Min:97.6 F (36.4 C), Max:98.7 F (37.1 C)  Recent Labs  Lab 03/05/20 1429 03/06/20 0306 03/06/20 0603 03/06/20 0604 03/06/20 6578 03/06/20 4696 03/06/20 1433 03/06/20 2216 03/07/20 0059 03/07/20 0414 03/08/20 0319 03/21/2020 0839  WBC 31.7*  --  27.7*  --   --   --   --   --   --  27.8* 27.9* 20.2*  CREATININE 2.78*   < >  --   --    < >  --    < > 2.15* 2.10* 2.17* 2.07* 2.22*  LATICACIDVEN  --   --   --  2.9*  --  2.2*  --   --  2.0* 1.7  --   --    < > = values in this interval not displayed.    Estimated Creatinine Clearance: 25.4 mL/min (A) (by C-G formula based on SCr of 2.22 mg/dL (H)).    Allergies  Allergen Reactions  . Tradjenta [Linagliptin] Anaphylaxis    CP  . Atorvastatin     REACTION: aches and pains  . Enalapril Maleate     REACTION: cough  . Farxiga [Dapagliflozin] Itching  . Hydrochlorothiazide     REACTION: hair loss  . Kenalog [Triamcinolone Acetonide]     HANDS NUMB   . Metformin And Related     Diarrhea, dizziness  . Propoxyphene N-Acetaminophen Hives  . Simvastatin     REACTION: cramps  . Spironolactone     REACTION: cramps    Thank you for allowing pharmacy to be a part of this patient's care.  Alfonse Spruce, PharmD PGY2 ID Pharmacy Resident Phone between 7 am - 3:30 pm: [phone]  Please check AMION for all [place] phone  numbers After 10:00 PM, call [place] [phone]

## 2020-03-09 NOTE — TOC Progression Note (Signed)
Transition of Care [facility]) - Progression Note    Patient Details  Name: Mala Gibbard MRN: [id number] Date of Birth: 08-09-1950  Transition of Care [facility]) CM/SW [place], [place] Phone Number: 03/09/2020, 12:27 PM  Clinical Narrative:    At this time only one offer, however referrals just sent this morning. CSW called and spoke with pt relative [facility], she is aware of offer from [facility] and knows there may be more offers to consider. She and pt will visit this afternoon. CSW team to f/u with pt on Sunday if any additional offers.   Expected Discharge Plan: [place] Barriers to Discharge: Continued Medical Work up, SNF Pending bed offer  Expected Discharge Plan and Services Expected Discharge Plan: [place] In-house Referral: [facility] Discharge Planning Services: CM Consult Post Acute Care Choice: [place] Living arrangements for the past 2 months: Apartment                 Readmission Risk Interventions No flowsheet data found.

## 2020-03-09 NOTE — TOC Initial Note (Addendum)
Transition of Care [facility]) - Initial/Assessment Note    Patient Details  Name: [name] MRN: [id number] Date of Birth: 10-Feb-1951  [facility]) CM/SW Contact:    Joanne Chars, LCSW Phone Number: 03/09/2020, 9:39 AM  Clinical Narrative:  CSW spoke wit pt by phone, pt agreeable to SNF recommendation and permission given to send out information on hub. Pt has not been to SNF before.   Permission given to speak to contact, Bea Deloatch.  Pt is not covid vaccinated.        Choice document provided to pt by Westley Hummer.             Expected Discharge Plan: Skilled Nursing Facility Barriers to Discharge: Continued Medical Work up, SNF Pending bed offer   Patient Goals and CMS Choice Patient states their goals for this hospitalization and ongoing recovery are:: get strength back to get home      Expected Discharge Plan and Services Expected Discharge Plan: [place] In-house Referral: [facility] Discharge Planning Services: CM Consult Post Acute Care Choice: [place] Living arrangements for the past 2 months: Apartment                                      Prior Living Arrangements/Services Living arrangements for the past 2 months: Apartment Lives with:: Self Patient language and need for interpreter reviewed:: Yes Do you feel safe going back to the place where you live?: Yes      Need for Family Participation in Patient Care: No (Comment) Care giver support system in place?: Yes (comment)   Criminal Activity/Legal Involvement Pertinent to Current Situation/Hospitalization: No - Comment as needed  Activities of Daily Living Home Assistive Devices/Equipment: Other (Comment) (UTA) ADL Screening (condition at time of admission) Patient's cognitive ability adequate to safely complete daily activities?: No Is the patient deaf or have difficulty hearing?: No Does the patient have difficulty seeing, even when wearing  glasses/contacts?: No Does the patient have difficulty concentrating, remembering, or making decisions?: Yes Patient able to express need for assistance with ADLs?: No Does the patient have difficulty dressing or bathing?:  (UTA) Independently performs ADLs?: No Does the patient have difficulty walking or climbing stairs?: Yes Weakness of Legs: Both Weakness of Arms/Hands: Both  Permission Sought/Granted Permission sought to share information with : Facility Sport and exercise psychologist, Family Supports Permission granted to share information with : Yes, Verbal Permission Granted  Share Information with NAME: Dance movement psychotherapist, friend  Permission granted to share info w AGENCY: SNF        Emotional Assessment Appearance:: Other (Comment Required (unable to assess-phone assessment) Attitude/Demeanor/Rapport: Engaged Affect (typically observed): Pleasant Orientation: : Oriented to Self, Oriented to Place, Oriented to  Time, Oriented to Situation Alcohol / Substance Use: Not Applicable Psych Involvement: No (comment)  Admission diagnosis:  Lower urinary tract infectious disease [N39.0] Cellulitis [L03.90] DKA (diabetic ketoacidoses) ([place]) [E11.10] Hyperglycemia [R73.9] Cellulitis of other specified site [S56.812] Patient Active Problem List   Diagnosis Date Noted  . Hyperglycemia 03/06/2020  . Acute lower UTI 03/06/2020  . Cellulitis 03/06/2020  . DKA, type 2 ([place]) 03/06/2020  . Sepsis ([place]) 03/06/2020  . Vaginal cysts 08/03/2018  . Hypertensive heart disease with congestive heart failure ([place]) 05/11/2018  . Boils 04/27/2018  . Edema 03/17/2017  . Rash and nonspecific skin eruption 12/08/2016  . CKD stage 4 due to type 2 diabetes mellitus (  [place]) 09/24/2016  . Cough 04/13/2016  . Left shoulder pain 09/16/2015  . Allergic rhinitis 09/16/2015  . Goiter 10/19/2014  . Low back pain 11/13/2013  . Dizziness 11/13/2013  . Pruritus 08/01/2013  . Well adult exam 08/01/2013  . GERD  (gastroesophageal reflux disease) 08/01/2013  . CAD (coronary artery disease) 06/11/2013  . Chronic combined systolic and diastolic CHF (congestive heart failure) ([place]) 06/11/2013  . Hypertension associated with diabetes ([place]) 06/07/2013  . Personal history of colon cancer   . Hyperlipidemia   . Obesity   . Disorder resulting from impaired renal function 12/19/2008  . LEG PAIN, BILATERAL 09/11/2008  . Acute sinusitis 06/13/2008  . COPD mixed type ([place]) 02/09/2008  . Controlled type 2 diabetes mellitus with circulatory disorder, without long-term current use of insulin ([place]) 10/13/2007  . Iron deficiency anemia 10/13/2007  . TOBACCO USE DISORDER/SMOKER-SMOKING CESSATION DISCUSSED 10/13/2007  . WEIGHT LOSS, ABNORMAL 10/13/2007  . History of malignant neoplasm of large intestine 10/13/2007   PCP:  [name], Evie Lacks, MD Pharmacy:   [id number] - [place], [place] [address] [place] [address] Phone: [phone] Fax: [phone]  [place], [place], [address] [place], [place] [address] Phone: [phone] Fax: [phone]     Social Determinants of Health (SDOH) Interventions    Readmission Risk Interventions No flowsheet data found.

## 2020-03-09 NOTE — Plan of Care (Signed)

## 2020-03-09 NOTE — Progress Notes (Signed)
PROGRESS NOTE  [name] STM:[id number] DOB: [birth year] DOA: 03/05/2020 PCP: [name], MD  HPI/Recap of past 24 hours: HPI from [name] [name] is a 69 y.o. 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 Pt in to ED with EMS after laying on floor all night following a fall at home.  Pt states she is in so much pain from multiple "abscesses in her groin area" that she fell.  Pt hasnt taken any meds in a week it seems.  CBG reading high. ED Course: WBC 31k, has cellulitis under abdomen and groin area, no drainable abscesses per EDP.  Also has UTI.  Creat 2.7 which is about baseline, BUN of 130 which is not. Bicarb of 16, anion gap of 16 and BHB of 1.98. BGL 674. Pt started on insulin gtt.  Given 1L LR bolus.  Patient admitted for further management.    Today, patient still complains of postop pain around her groin and pannus area.  Denied any other new complaints.     Assessment/Plan: Principal Problem:   Cellulitis Active Problems:   Controlled type 2 diabetes mellitus with circulatory disorder, without long-term current use of insulin (HCC)   Hypertension associated with diabetes ([place])   Chronic combined systolic and diastolic CHF (congestive heart failure) ([place])   CKD stage 4 due to type 2 diabetes mellitus ([place])   Hyperglycemia   Acute lower UTI   DKA, type 2 (HCC)   Sepsis ([place])   Sepsis 2/2 perineal abscesses with widespread cellulitis of the pannus, right groin region s/p I&D on 03/07/2020 On admission, noted leukocytosis, tachypnea, lactic acidosis Currently afebrile, with leukocytosis BC x2 NGTD Deep surgical wound culture pending Chest x-ray showed minimal left basilar opacity, likely atelectasis versus mild pneumonia CT abdomen/pelvis showed extensive soft tissue stranding and thickening extending across the lower anterior abdominal wall, concerning for severe cellulitis and aggressive soft tissue  infection General surgery consulted, further wound care as per general surgery Continue vancomycin, Zosyn Monitor closely  DKA/DM type 2, uncontrolled A1c on 01/10/2020 was 7.2 S/p insulin drip, with improvement in CBG and closed anion gap, although still remains acidotic which could likely be from worsening kidney function/sepsis SSI, Lantus, NovoLog 3 times daily, Accu-Cheks, hypoglycemic protocol Monitor closely  Enterococcus faecalis UTI UA with moderate hemoglobin, large leukocytes, few bacteria, WBC 21-50 UC grew 60,000 E faecalis Continue vancomycin  Hypertension BP soft Continue to hold home Bystolic, amlodipine, hydralazine for now  CKD stage IV/acidosis Creatinine at baseline Hold home torsemide for now Strict I's and O's Monitor closely  Anemia of chronic kidney disease Baseline hemoglobin 8-10 Anemia panel showed iron 30, sats 24%, ferritin 630, folate 7, vitamin B12 over 3000 No evidence of bleeding Daily CBC  Chronic diastolic HF/history of ischemic cardiomyopathy History of stent placement Echo done on 05/2019 showed EF of 60 to 89%, grade 2 diastolic dysfunction Monitor closely  Obesity Lifestyle modification advised        Malnutrition Type:  Nutrition Problem: Increased nutrient needs Etiology: post-op healing   Malnutrition Characteristics:  Signs/Symptoms: estimated needs   Nutrition Interventions:  Interventions: MVI, Premier Protein, Juven    Estimated body mass index is 32.68 kg/m as calculated from the following:   Height as of this encounter: 5\' 4"  (1.626 m).   Weight as of this encounter: 86.4 kg.     Code Status: Full  Family Communication: Discussed with patient  Disposition Plan: Status is: Inpatient  Remains inpatient appropriate because:Inpatient level of care appropriate due to severity of illness   Dispo: The patient is from: Home              Anticipated d/c is to: Home              Anticipated d/c date is:  3 days              Patient currently is not medically stable to d/c.    Consultants:  General surgery  Procedures:  I&D on 03/07/2020  Antimicrobials:  Zosyn  Vancomycin  DVT prophylaxis: [place] Heparin   Objective: Vitals:   03/08/20 1959 03/09/20 0424 03/09/20 0741 03/09/20 1300  BP: (!) 108/55 (!) 112/53 (!) 116/43 (!) 118/49  Pulse: 62 71 69 72  Resp: 17 15 17 17   Temp: 98.3 F (36.8 C) 98.2 F (36.8 C) 98.7 F (37.1 C) 98.6 F (37 C)  TempSrc: Oral Oral Oral Oral  SpO2: 100% 100% 100% 100%  Weight:      Height:        Intake/Output Summary (Last 24 hours) at 03/09/2020 1643 Last data filed at 03/09/2020 1500 Gross per 24 hour  Intake 2365.89 ml  Output 1700 ml  Net 665.89 ml   Filed Weights   03/06/20 2101  Weight: 86.4 kg    Exam:  General: NAD, awake, alert, oriented  Cardiovascular: S1, S2 present  Respiratory: CTAB  Abdomen: Soft, +tender, nondistended, bowel sounds present  Musculoskeletal: No bilateral pedal edema noted  Skin:  Please see picture from general surgery progress note  Psychiatry: Normal mood    Data Reviewed: CBC: Recent Labs  Lab 03/05/20 1429 03/05/20 1429 03/06/20 0319 03/06/20 0603 03/07/20 0414 03/08/20 0319 03/09/20 0839  WBC 31.7*  --   --  27.7* 27.8* 27.9* 20.2*  NEUTROABS  --   --   --   --  24.7* 25.8* 17.6*  HGB 8.6*   < > 10.9* 8.1* 7.8* 7.4* 7.1*  HCT 28.5*   < > 32.0* 26.6* 24.2* 23.4* 22.5*  MCV 93.1  --   --  91.4 88.0 90.3 90.7  PLT 193  --   --  192 184 198 200   < > = values in this interval not displayed.   Basic Metabolic Panel: Recent Labs  Lab 03/06/20 2216 03/07/20 0059 03/07/20 0414 03/08/20 0319 03/09/20 0839  NA 139 138 140 141 135  K 4.3 4.7 4.3 4.6 4.2  CL 109 107 110 109 107  CO2 16* 18* 19* 18* 21*  GLUCOSE 256* 280* 282* 419* 284*  BUN 94* 99* 91* 71* 71*  CREATININE 2.15* 2.10* 2.17* 2.07* 2.22*  CALCIUM 9.1 8.9 9.0 8.6* 8.2*   [name]: Estimated Creatinine  Clearance: 25.4 mL/min (A) (by C-G formula based on SCr of 2.22 mg/dL (H)). Liver Function Tests: No results for input(s): AST, ALT, ALKPHOS, BILITOT, PROT, ALBUMIN in the last 168 hours. No results for input(s): LIPASE, AMYLASE in the last 168 hours. No results for input(s): AMMONIA in the last 168 hours. Coagulation Profile: No results for input(s): INR, PROTIME in the last 168 hours. Cardiac Enzymes: Recent Labs  Lab 03/05/20 1429  CKTOTAL 104   BNP (last 3 results) No results for input(s): PROBNP in the last 8760 hours. HbA1C: No results for input(s): HGBA1C in the last 72 hours. CBG: Recent Labs  Lab 03/08/20 1144 03/08/20 1620 03/08/20 2044 03/09/20 0636 03/09/20 1125  GLUCAP 462* 308* 200* 331* 317*   Lipid  Profile: No results for input(s): CHOL, HDL, LDLCALC, TRIG, CHOLHDL, LDLDIRECT in the last 72 hours. Thyroid Function Tests: No results for input(s): TSH, T4TOTAL, FREET4, T3FREE, THYROIDAB in the last 72 hours. Anemia Panel: Recent Labs    03/09/20 0839  VITAMINB12 3,733*  FOLATE 10.1  FERRITIN 630*  TIBC 127*  IRON 30   Urine analysis:    Component Value Date/Time   COLORURINE YELLOW 03/05/2020 2040   APPEARANCEUR CLOUDY (A) 03/05/2020 2040   LABSPEC 1.010 03/05/2020 2040   PHURINE 5.0 03/05/2020 2040   GLUCOSEU >=500 (A) 03/05/2020 2040   GLUCOSEU NEGATIVE 10/03/2013 1004   HGBUR MODERATE (A) 03/05/2020 2040   BILIRUBINUR NEGATIVE 03/05/2020 2040   KETONESUR NEGATIVE 03/05/2020 2040   PROTEINUR NEGATIVE 03/05/2020 2040   UROBILINOGEN 0.2 10/03/2013 1004   NITRITE NEGATIVE 03/05/2020 2040   LEUKOCYTESUR LARGE (A) 03/05/2020 2040   Sepsis Labs: @LABRCNTIP (procalcitonin:4,lacticidven:4)  ) Recent Results (from the past 240 hour(s))  SARS Coronavirus 2 by RT PCR (hospital order, performed in [place] hospital lab) Nasopharyngeal Nasopharyngeal Swab     Status: None   Collection Time: 03/06/20  3:06 AM   Specimen: Nasopharyngeal Swab   Result Value Ref Range Status   SARS Coronavirus 2 NEGATIVE NEGATIVE Final    Comment: (NOTE) SARS-CoV-2 target nucleic acids are NOT DETECTED.  The SARS-CoV-2 RNA is generally detectable in upper and lower respiratory specimens during the acute phase of infection. The lowest concentration of SARS-CoV-2 viral copies this assay can detect is 250 copies / mL. A negative result does not preclude SARS-CoV-2 infection and should not be used as the sole basis for treatment or other patient management decisions.  A negative result may occur with improper specimen collection / handling, submission of specimen other than nasopharyngeal swab, presence of viral mutation(s) within the areas targeted by this assay, and inadequate number of viral copies (<250 copies / mL). A negative result must be combined with clinical observations, patient history, and epidemiological information.  Fact Sheet for Patients:   StrictlyIdeas.no  Fact Sheet for Healthcare Providers: BankingDealers.co.za  This test is not yet approved or  cleared by the [place] FDA and has been authorized for detection and/or diagnosis of SARS-CoV-2 by FDA under an Emergency Use Authorization (EUA).  This EUA will remain in effect (meaning this test can be used) for the duration of the COVID-19 declaration under Section 564(b)(1) of the Act, 21 U.S.C. section 360bbb-3(b)(1), unless the authorization is terminated or revoked sooner.  Performed at [facility], [place] [address], [place], [place] [address]   Urine culture     Status: Abnormal   Collection Time: 03/06/20  4:50 AM   Specimen: Urine, Random  Result Value Ref Range Status   Specimen Description URINE, RANDOM  Final   Special Requests   Final    NONE Performed at [facility], [place] [address], [place], [place] [address]    Culture 60,000 COLONIES/mL ENTEROCOCCUS FAECALIS (A)  Final   Report Status  03/08/2020 FINAL  Final   Organism ID, Bacteria ENTEROCOCCUS FAECALIS (A)  Final      Susceptibility   Enterococcus faecalis - MIC*    AMPICILLIN <=2 SENSITIVE Sensitive     NITROFURANTOIN <=16 SENSITIVE Sensitive     VANCOMYCIN 1 SENSITIVE Sensitive     * 60,000 COLONIES/mL ENTEROCOCCUS FAECALIS  Culture, blood (routine x 2)     Status: None (Preliminary result)   Collection Time: 03/06/20  6:00 AM   Specimen:  BLOOD RIGHT FOREARM  Result Value Ref Range Status   Specimen Description BLOOD RIGHT FOREARM  Final   Special Requests   Final    BOTTLES DRAWN AEROBIC AND ANAEROBIC Blood Culture adequate volume   Culture   Final    NO GROWTH 2 DAYS Performed at [facility], [address], [place], [place] [address]    Report Status PENDING  Incomplete  Culture, blood (routine x 2)     Status: None (Preliminary result)   Collection Time: 03/06/20  8:33 AM   Specimen: BLOOD LEFT HAND  Result Value Ref Range Status   Specimen Description BLOOD LEFT HAND  Final   Special Requests   Final    BOTTLES DRAWN AEROBIC ONLY Blood Culture results may not be optimal due to an inadequate volume of blood received in culture bottles   Culture   Final    NO GROWTH 2 DAYS Performed at [facility], [place] [address] [place], [place] [address]    Report Status PENDING  Incomplete  MRSA PCR Screening     Status: None   Collection Time: 03/06/20 10:07 PM   Specimen: Nasal Mucosa; Nasopharyngeal  Result Value Ref Range Status   MRSA by PCR NEGATIVE NEGATIVE Final    Comment:        The GeneXpert MRSA Assay (FDA approved for NASAL specimens only), is one component of a comprehensive MRSA colonization surveillance program. It is not intended to diagnose MRSA infection nor to guide or monitor treatment for MRSA infections. Performed at [facility], [place] [address], [place], [place] [address]   Aerobic/Anaerobic Culture (surgical/deep wound)     Status: None (Preliminary result)    Collection Time: 03/07/20  4:07 PM   Specimen: PATH Other; Tissue  Result Value Ref Range Status   Specimen Description ABSCESS PERINEAL RIGHT  Final   Special Requests PT ON ANCEF ZOSYN  Final   Gram Stain   Final    FEW WBC PRESENT, PREDOMINANTLY PMN ABUNDANT GRAM NEGATIVE RODS ABUNDANT GRAM POSITIVE COCCI IN PAIRS MODERATE GRAM POSITIVE RODS Performed at [facility], [place] [address], [place], [place] [address]    Culture FEW ENTEROCOCCUS FAECALIS  Final   Report Status PENDING  Incomplete      Studies: No results found.  Scheduled Meds: . aspirin  81 mg Oral Daily  . calcium carbonate  1,250 mg Oral BID WC  . Chlorhexidine Gluconate Cloth  6 each Topical Once  . heparin  5,000 Units Subcutaneous Q8H  . insulin aspart  0-15 Units Subcutaneous TID WC  . insulin aspart  0-5 Units Subcutaneous QHS  . insulin aspart  8 Units Subcutaneous TID WC  . insulin glargine  15 Units Subcutaneous BID  . multivitamin with minerals  1 tablet Oral Daily  . nutrition supplement (JUVEN)  1 packet Oral BID BM  . pantoprazole  20 mg Oral Daily  . Ensure Max Protein  11 oz Oral QHS  . rosuvastatin  5 mg Oral Daily    Continuous Infusions: . lactated ringers 10 mL/hr at 03/07/20 1431  . piperacillin-tazobactam (ZOSYN)  IV 3.375 g (03/09/20 1416)  . vancomycin Stopped (03/08/20 2339)     LOS: 3 days     Alma Friendly, MD [facility]  If 7PM-7AM, please contact night-coverage [email or web address] 03/09/2020, 4:43 PM

## 2020-03-09 NOTE — NC FL2 (Signed)
[place] LEVEL OF CARE SCREENING TOOL     IDENTIFICATION  Patient Name: [name] Birthdate: April 10, 1951 Sex: female Admission Date (Current Location): 03/05/2020  [place] and [place] Number:  [name] [id number] [place] and Address:  [facility] [place], [place] [address], [place], [place] [address]      Provider Number: [id number]  Attending Physician Name and Address:  Alma Friendly, MD  Relative Name and Phone Number:  Deloatch,Fannie "Bea",  Relative, [phone]    Current Level of Care: Hospital Recommended Level of Care: [place] Prior Approval Number:    Date Approved/Denied:   PASRR Number: [id number]  Discharge Plan: SNF    Current Diagnoses: Patient Active Problem List   Diagnosis Date Noted  . Hyperglycemia 03/06/2020  . Acute lower UTI 03/06/2020  . Cellulitis 03/06/2020  . DKA, type 2 ([place]) 03/06/2020  . Sepsis ([place]) 03/06/2020  . Vaginal cysts 08/03/2018  . Hypertensive heart disease with congestive heart failure ([place]) 05/11/2018  . Boils 04/27/2018  . Edema 03/17/2017  . Rash and nonspecific skin eruption 12/08/2016  . CKD stage 4 due to type 2 diabetes mellitus ([place]) 09/24/2016  . Cough 04/13/2016  . Left shoulder pain 09/16/2015  . Allergic rhinitis 09/16/2015  . Goiter 10/19/2014  . Low back pain 11/13/2013  . Dizziness 11/13/2013  . Pruritus 08/01/2013  . Well adult exam 08/01/2013  . GERD (gastroesophageal reflux disease) 08/01/2013  . CAD (coronary artery disease) 06/11/2013  . Chronic combined systolic and diastolic CHF (congestive heart failure) ([place]) 06/11/2013  . Hypertension associated with diabetes ([place]) 06/07/2013  . Personal history of colon cancer   . Hyperlipidemia   . Obesity   . Disorder resulting from impaired renal function 12/19/2008  . LEG PAIN, BILATERAL 09/11/2008  . Acute sinusitis 06/13/2008  . COPD mixed type ([place]) 02/09/2008  . Controlled type 2  diabetes mellitus with circulatory disorder, without long-term current use of insulin ([place]) 10/13/2007  . Iron deficiency anemia 10/13/2007  . TOBACCO USE DISORDER/SMOKER-SMOKING CESSATION DISCUSSED 10/13/2007  . WEIGHT LOSS, ABNORMAL 10/13/2007  . History of malignant neoplasm of large intestine 10/13/2007    Orientation RESPIRATION BLADDER Height & Weight     Self, Time, Place  Normal Continent Weight: 190 lb 6.4 oz (86.4 kg) Height:  5\' 4"  (162.6 cm)  BEHAVIORAL SYMPTOMS/MOOD NEUROLOGICAL BOWEL NUTRITION STATUS      Continent Diet (heart healthy carb modified.  See discharge summary)  AMBULATORY STATUS COMMUNICATION OF NEEDS Skin   Limited Assist Verbally Surgical wounds                       Personal Care Assistance Level of Assistance  Bathing, Feeding, Dressing Bathing Assistance: Maximum assistance Feeding assistance: Independent Dressing Assistance: Maximum assistance     Functional Limitations Info  Sight, Hearing, Speech Sight Info: Adequate Hearing Info: Adequate Speech Info: Adequate    SPECIAL CARE FACTORS FREQUENCY  PT (By licensed PT), OT (By licensed OT)     PT Frequency: 5x week OT Frequency: 5x week            Contractures Contractures Info: Not present    Additional Factors Info  Code Status, Allergies Code Status Info: full Allergies Info: Tradjenta Linagliptin, Atorvastatin, Enalapril Maleate, Farxiga Dapagliflozin, Hydrochlorothiazide, Kenalog Triamcinolone Acetonide, Metformin And Related, Propoxyphene N-acetaminophen, Simvastatin, Spironolactone           Current Medications (03/09/2020):  This is the current hospital active medication list Current  Facility-Administered Medications  Medication Dose Route Frequency Provider Last Rate Last Admin  . acetaminophen (TYLENOL) tablet 650 mg  650 mg Oral Q6H PRN Cornett, Marcello Moores, MD   650 mg at 03/06/20 1338   Or  . acetaminophen (TYLENOL) suppository 650 mg  650 mg Rectal Q6H PRN Cornett,  Thomas, MD      . aspirin chewable tablet 81 mg  81 mg Oral Daily Cornett, Marcello Moores, MD   81 mg at 03/08/20 0939  . calcium carbonate (OS-CAL - dosed in mg of elemental calcium) tablet 1,250 mg  1,250 mg Oral BID WC Cornett, Marcello Moores, MD   1,250 mg at 03/08/20 1638  . Chlorhexidine Gluconate Cloth 2 % PADS 6 each  6 each Topical Once Cornett, Thomas, MD      . dextrose 50 % solution 0-50 mL  0-50 mL Intravenous PRN Cornett, Thomas, MD      . heparin injection 5,000 Units  5,000 Units Subcutaneous Q8H Erroll Luna, MD   5,000 Units at 03/09/20 0603  . HYDROmorphone (DILAUDID) injection 1 mg  1 mg Intravenous Q2H PRN Cornett, Marcello Moores, MD   1 mg at 03/08/20 1348  . hydrOXYzine (ATARAX/VISTARIL) tablet 25-50 mg  25-50 mg Oral Q8H PRN Cornett, Thomas, MD      . insulin aspart (novoLOG) injection 0-15 Units  0-15 Units Subcutaneous TID WC Erroll Luna, MD   11 Units at 03/09/20 [phone]  . insulin aspart (novoLOG) injection 0-5 Units  0-5 Units Subcutaneous QHS Erroll Luna, MD   5 Units at 03/07/20 2127  . insulin aspart (novoLOG) injection 8 Units  8 Units Subcutaneous TID WC Alma Friendly, MD      . insulin glargine (LANTUS) injection 15 Units  15 Units Subcutaneous BID Alma Friendly, MD      . lactated ringers infusion   Intravenous Continuous Erroll Luna, MD 10 mL/hr at 03/07/20 1431 New Bag at 03/07/20 1431  . morphine 2 MG/ML injection 2 mg  2 mg Intravenous Q4H PRN Levada Dy, Dwayne A, RPH      . multivitamin with minerals tablet 1 tablet  1 tablet Oral Daily Cornett, Thomas, MD   1 tablet at 03/08/20 9604  . nutrition supplement (JUVEN) (JUVEN) powder packet 1 packet  1 packet Oral BID BM Erroll Luna, MD   1 packet at 03/08/20 1403  . ondansetron (ZOFRAN) tablet 4 mg  4 mg Oral Q6H PRN Cornett, Thomas, MD       Or  . ondansetron (ZOFRAN) injection 4 mg  4 mg Intravenous Q6H PRN Cornett, Thomas, MD      . pantoprazole (PROTONIX) EC tablet 20 mg  20 mg Oral Daily Cornett,  Thomas, MD   20 mg at 03/08/20 0939  . piperacillin-tazobactam (ZOSYN) IVPB 3.375 g  3.375 g Intravenous Q8H Cornett, Thomas, MD 12.5 mL/hr at 03/09/20 0602 3.375 g at 03/09/20 0602  . protein supplement (ENSURE MAX) liquid  11 oz Oral QHS Cornett, Marcello Moores, MD   11 oz at 03/08/20 2046  . rosuvastatin (CRESTOR) tablet 5 mg  5 mg Oral Daily Cornett, Thomas, MD   5 mg at 03/08/20 5409  . vancomycin (VANCOCIN) IVPB 1000 mg/200 mL premix  1,000 mg Intravenous Q24H Erroll Luna, MD   Stopped at 03/08/20 2339     Discharge Medications: Please see discharge summary for a list of discharge medications.  Relevant Imaging Results:  Relevant Lab Results:   Additional Information SSN: [id number]  Joanne Chars, LCSW

## 2020-03-09 NOTE — Progress Notes (Signed)
2 Days Post-Op   Subjective/Chief Complaint: Still with pain but improved   Objective: Vital signs in last 24 hours: Temp:  [97.6 F (36.4 C)-98.7 F (37.1 C)] 98.7 F (37.1 C) (09/04 0741) Pulse Rate:  [62-71] 69 (09/04 0741) Resp:  [15-17] 17 (09/04 0741) BP: (108-120)/(43-57) 116/43 (09/04 0741) SpO2:  [100 %] 100 % (09/04 0741) Last BM Date:  (UTA)  Intake/Output from previous day: 09/03 0701 - 09/04 0700 In: 1706.7 [P.O.:720; I.V.:395.3; IV Piggyback:591.4] Out: 1500 [Urine:1500] Intake/Output this shift: Total I/O In: 240 [P.O.:240] Out: -   Exam: Awake and alert Perineal wound fairly clean  Lab Results:  Recent Labs    03/08/20 0319 03/09/20 0839  WBC 27.9* 20.2*  HGB 7.4* 7.1*  HCT 23.4* 22.5*  PLT 198 200   BMET Recent Labs    03/08/20 0319 03/09/20 0839  NA 141 135  K 4.6 4.2  CL 109 107  CO2 18* 21*  GLUCOSE 419* 284*  BUN 71* 71*  CREATININE 2.07* 2.22*  CALCIUM 8.6* 8.2*   PT/INR No results for input(s): LABPROT, INR in the last 72 hours. ABG No results for input(s): PHART, HCO3 in the last 72 hours.  Invalid input(s): PCO2, PO2  Studies/Results: No results found.  Anti-infectives: Anti-infectives (From admission, onward)   Start     Dose/Rate Route Frequency Ordered Stop   03/07/20 1430  ceFAZolin (ANCEF) IVPB 2g/100 mL premix        2 g 200 mL/hr over 30 Minutes Intravenous On call to O.R. 03/07/20 1415 03/07/20 1625   03/07/20 0300  cefTRIAXone (ROCEPHIN) 1 g in sodium chloride 0.9 % 100 mL IVPB  Status:  Discontinued        1 g 200 mL/hr over 30 Minutes Intravenous Every 24 hours 03/06/20 0438 03/06/20 2214   03/06/20 2245  vancomycin (VANCOCIN) IVPB 1000 mg/200 mL premix        1,000 mg 200 mL/hr over 60 Minutes Intravenous Every 24 hours 03/06/20 2233     03/06/20 2230  piperacillin-tazobactam (ZOSYN) IVPB 3.375 g        3.375 g 12.5 mL/hr over 240 Minutes Intravenous Every 8 hours 03/06/20 2226     03/06/20 2215   piperacillin-tazobactam (ZOSYN) IVPB 4.5 g  Status:  Discontinued        4.5 g 200 mL/hr over 30 Minutes Intravenous Every 8 hours 03/06/20 2214 03/06/20 2224   03/06/20 0215  cefTRIAXone (ROCEPHIN) 1 g in sodium chloride 0.9 % 100 mL IVPB        1 g 200 mL/hr over 30 Minutes Intravenous  Once 03/06/20 0214 03/06/20 0336      Assessment/Plan: s/p Procedure(s): IRRIGATION AND DEBRIDEMENT PERINEUM  AND PANNUS ABSCESS (N/A)  WBC down to 20K Continue antibiotics and dressing changes  LOS: 3 days    [name] 03/09/2020

## 2020-03-10 LAB — GLUCOSE, CAPILLARY
Glucose-Capillary: 190 mg/dL — ABNORMAL HIGH (ref 70–99)
Glucose-Capillary: 136 mg/dL — ABNORMAL HIGH (ref 70–99)
Glucose-Capillary: 141 mg/dL — ABNORMAL HIGH (ref 70–99)
Glucose-Capillary: 107 mg/dL — ABNORMAL HIGH (ref 70–99)

## 2020-03-10 LAB — BASIC METABOLIC PANEL
Anion gap: 9 (ref 5–15)
BUN: 63 mg/dL — ABNORMAL HIGH (ref 8–23)
CO2: 21 mmol/L — ABNORMAL LOW (ref 22–32)
Calcium: 8.4 mg/dL — ABNORMAL LOW (ref 8.9–10.3)
Chloride: 109 mmol/L (ref 98–111)
Creatinine, Ser: 1.81 mg/dL — ABNORMAL HIGH (ref 0.44–1.00)
GFR calc Af Amer: 32 mL/min — ABNORMAL LOW (ref >60)
GFR calc non Af Amer: 28 mL/min — ABNORMAL LOW (ref >60)
Glucose, Bld: 129 mg/dL — ABNORMAL HIGH (ref 70–99)
Potassium: 4 mmol/L (ref 3.5–5.1)
Sodium: 139 mmol/L (ref 135–145)

## 2020-03-10 LAB — CBC WITH DIFFERENTIAL/PLATELET
Abs Immature Granulocytes: 0 10*3/uL (ref 0.00–0.07)
Basophils Absolute: 0 10*3/uL (ref 0.0–0.1)
Basophils Relative: 0 %
Eosinophils Absolute: 0.2 10*3/uL (ref 0.0–0.5)
Eosinophils Relative: 1 %
HCT: 23.2 % — ABNORMAL LOW (ref 36.0–46.0)
Hemoglobin: 7.3 g/dL — ABNORMAL LOW (ref 12.0–15.0)
Lymphocytes Relative: 4 %
Lymphs Abs: 0.8 10*3/uL (ref 0.7–4.0)
MCH: 28.5 pg (ref 26.0–34.0)
MCHC: 31.5 g/dL (ref 30.0–36.0)
MCV: 90.6 fL (ref 80.0–100.0)
Monocytes Absolute: 0.8 10*3/uL (ref 0.1–1.0)
Monocytes Relative: 4 %
Neutro Abs: 17.6 10*3/uL — ABNORMAL HIGH (ref 1.7–7.7)
Neutrophils Relative %: 91 %
Platelets: 180 10*3/uL (ref 150–400)
RBC: 2.56 MIL/uL — ABNORMAL LOW (ref 3.87–5.11)
RDW: 15.8 % — ABNORMAL HIGH (ref 11.5–15.5)
WBC: 19.3 10*3/uL — ABNORMAL HIGH (ref 4.0–10.5)
nRBC: 0 /100 WBC
nRBC: 0.2 % (ref 0.0–0.2)

## 2020-03-10 LAB — VANCOMYCIN, TROUGH: Vancomycin Tr: 29 ug/mL (ref 15–20)

## 2020-03-10 NOTE — Plan of Care (Signed)
  Problem: Education: Goal: Knowledge of General Education information will improve Description: Including pain rating scale, medication(s)/side effects and non-pharmacologic comfort measures Outcome: Progressing   Problem: Clinical Measurements: Goal: Will remain free from infection Outcome: Progressing   Problem: Activity: Goal: Risk for activity intolerance will decrease Outcome: Progressing   Problem: Nutrition: Goal: Adequate nutrition will be maintained Outcome: Progressing   Problem: Elimination: Goal: Will not experience complications related to bowel motility Outcome: Progressing

## 2020-03-10 NOTE — Progress Notes (Addendum)
[facility] Progress Note  3 Days Post-Op  Subjective: CC-   Surgical sites still sore but pain fairly well controlled with dressing changes. WBC 19.3 from 20.2, TMAX 99.1.  Objective: Vital signs in last 24 hours: Temp:  [98.1 F (36.7 C)-99.1 F (37.3 C)] 99.1 F (37.3 C) (09/05 0734) Pulse Rate:  [72-88] 88 (09/05 0734) Resp:  [15-18] 15 (09/05 0734) BP: (116-138)/(49-77) 138/58 (09/05 0734) SpO2:  [99 %-100 %] 100 % (09/05 0734) Last BM Date:  (UTA)  Intake/Output from previous day: 09/04 0701 - 09/05 0700 In: 1869.2 [P.O.:1471; I.V.:89.5; IV Piggyback:308.7] Out: 2500 [Urine:2500] Intake/Output this shift: No intake/output data recorded.  PE: Gen:  Alert, NAD, pleasant Pulm:  rate and effort normal Abd: Soft, NT/ND -Left thigh: open wound with trace purulent drainage at base, no immediate surrounding cellulitis but there are a couple small areas on induration/cellulitis on her distal right lower abdomen   -Posterior left thigh/perineum: 2 opens with moderate amount of purulent drainage, no cellulitis or induration     Lab Results:  Recent Labs    03/09/20 0839 03/10/20 0317  WBC 20.2* 19.3*  HGB 7.1* 7.3*  HCT 22.5* 23.2*  PLT 200 180   BMET Recent Labs    03/09/20 0839 03/10/20 0317  NA 135 139  K 4.2 4.0  CL 107 109  CO2 21* 21*  GLUCOSE 284* 129*  BUN 71* 63*  CREATININE 2.22* 1.81*  CALCIUM 8.2* 8.4*   PT/INR No results for input(s): LABPROT, INR in the last 72 hours. CMP     Component Value Date/Time   NA 139 03/10/2020 0317   NA 138 03/29/2018 1042   K 4.0 03/10/2020 0317   CL 109 03/10/2020 0317   CO2 21 (L) 03/10/2020 0317   GLUCOSE 129 (H) 03/10/2020 0317   BUN 63 (H) 03/10/2020 0317   BUN 41 (H) 03/29/2018 1042   CREATININE 1.81 (H) 03/10/2020 0317   CREATININE 2.16 (H) 01/13/2017 1146   CALCIUM 8.4 (L) 03/10/2020 0317   PROT 7.8 01/10/2019 1018   PROT 6.6 03/29/2018 1042   ALBUMIN 4.4 01/10/2019 1018    ALBUMIN 4.2 03/29/2018 1042   AST 12 01/10/2019 1018   ALT 12 01/10/2019 1018   ALKPHOS 112 01/10/2019 1018   BILITOT 0.4 01/10/2019 1018   BILITOT 0.2 03/29/2018 1042   GFRNONAA 28 (L) 03/10/2020 0317   GFRNONAA 23 (L) 01/13/2017 1146   GFRAA 32 (L) 03/10/2020 0317   GFRAA 27 (L) 01/13/2017 1146   Lipase  No results found for: LIPASE     Studies/Results: No results found.  Anti-infectives: Anti-infectives (From admission, onward)   Start     Dose/Rate Route Frequency Ordered Stop   03/07/20 1430  ceFAZolin (ANCEF) IVPB 2g/100 mL premix        2 g 200 mL/hr over 30 Minutes Intravenous On call to O.R. 03/07/20 1415 03/07/20 1625   03/07/20 0300  cefTRIAXone (ROCEPHIN) 1 g in sodium chloride 0.9 % 100 mL IVPB  Status:  Discontinued        1 g 200 mL/hr over 30 Minutes Intravenous Every 24 hours 03/06/20 0438 03/06/20 2214   03/06/20 2245  vancomycin (VANCOCIN) IVPB 1000 mg/200 mL premix        1,000 mg 200 mL/hr over 60 Minutes Intravenous Every 24 hours 03/06/20 2233     03/06/20 2230  piperacillin-tazobactam (ZOSYN) IVPB 3.375 g        3.375 g 12.5 mL/hr over 240 Minutes Intravenous  Every 8 hours 03/06/20 2226     03/06/20 2215  piperacillin-tazobactam (ZOSYN) IVPB 4.5 g  Status:  Discontinued        4.5 g 200 mL/hr over 30 Minutes Intravenous Every 8 hours 03/06/20 2214 03/06/20 2224   03/06/20 0215  cefTRIAXone (ROCEPHIN) 1 g in sodium chloride 0.9 % 100 mL IVPB        1 g 200 mL/hr over 30 Minutes Intravenous  Once 03/06/20 0214 03/06/20 0336       Assessment/Plan DM CKD4 HTN ICM Obesity BMI 32.68 UTI  Right perineal abscess complex multiloculated S/p Incision drainage right perineal abscess complex 9/2 Dr. Brantley Stage  - POD #3  - culture ENTEROCOCCUS FAECALIS, report pending - WBC down 27.9>>20.2>>19.3, TMAX 99.1 - Continue BID wet to dry dressing changes to wounds (3 separate wounds). Shower daily with wounds open. Continues to have purulent drainage,  mostly from the 2 posterior wounds. WBC trending down, afebrile. Monitor wounds closely. Continue IV antibiotics and follow culture.   ID - zosyn 9/1>>, vancomcyin 9/1>> FEN - HH/CM diet VTE - SCDs, sq heparin Foley - in place Follow up - DOW vs wound clinic   LOS: 4 days    Wellington Hampshire, [facility] Surgery 03/10/2020, 9:27 AM Please see Amion for pager number during day hours 7:00am-4:30pm  Agree with above.  Alphonsa Overall, MD, [facility] Surgery Office phone:  [phone]

## 2020-03-10 NOTE — Plan of Care (Signed)

## 2020-03-10 NOTE — Progress Notes (Signed)
PROGRESS NOTE  [name] DOB: 07/24/1950 DOA: 03/05/2020 PCP: [name], MD  HPI/Recap of past 24 hours: HPI from [name] [name] is a 69 y.o. 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 Pt in to ED with EMS after laying on floor all night following a fall at home.  Pt states she is in so much pain from multiple "abscesses in her groin area" that she fell.  Pt hasnt taken any meds in a week it seems.  CBG reading high. ED Course: WBC 31k, has cellulitis under abdomen and groin area, no drainable abscesses per EDP.  Also has UTI.  Creat 2.7 which is about baseline, BUN of 130 which is not. Bicarb of 16, anion gap of 16 and BHB of 1.98. BGL 674. Pt started on insulin gtt.  Given 1L LR bolus.  Patient admitted for further management.    Today, patient denied any new complaints, just some postop pain during dressing change.     Assessment/Plan: Principal Problem:   Cellulitis Active Problems:   Controlled type 2 diabetes mellitus with circulatory disorder, without long-term current use of insulin (HCC)   Hypertension associated with diabetes ([place])   Chronic combined systolic and diastolic CHF (congestive heart failure) ([place])   CKD stage 4 due to type 2 diabetes mellitus ([place])   Hyperglycemia   Acute lower UTI   DKA, type 2 (HCC)   Sepsis ([place])   Sepsis 2/2 perineal abscesses with widespread cellulitis of the pannus, right groin region s/p I&D on 03/07/2020 On admission, noted leukocytosis, tachypnea, lactic acidosis Currently afebrile, with leukocytosis BC x2 NGTD Deep surgical wound culture growing E faecalis Chest x-ray showed minimal left basilar opacity, likely atelectasis versus mild pneumonia CT abdomen/pelvis showed extensive soft tissue stranding and thickening extending across the lower anterior abdominal wall, concerning for severe cellulitis and aggressive soft tissue infection General  surgery consulted, further wound care as per general surgery Continue vancomycin, Zosyn Monitor closely  DKA/DM type 2, uncontrolled A1c on 01/10/2020 was 7.2 S/p insulin drip, with improvement in CBG and closed anion gap, although still remains acidotic which could likely be from worsening kidney function/sepsis SSI, Lantus, NovoLog 3 times daily, Accu-Cheks, hypoglycemic protocol Monitor closely  Enterococcus faecalis UTI UA with moderate hemoglobin, large leukocytes, few bacteria, WBC 21-50 UC grew 60,000 E faecalis Continue vancomycin  Hypertension BP soft Continue to hold home Bystolic, amlodipine, hydralazine for now  CKD stage IV/acidosis Creatinine at baseline Hold home torsemide for now Strict I's and O's Monitor closely  Anemia of chronic kidney disease Baseline hemoglobin 8-10 Anemia panel showed iron 30, sats 24%, ferritin 630, folate 7, vitamin B12 over 3000 No evidence of bleeding Daily CBC  Chronic diastolic HF/history of ischemic cardiomyopathy History of stent placement Echo done on 05/2019 showed EF of 60 to 84%, grade 2 diastolic dysfunction Monitor closely  Obesity Lifestyle modification advised        Malnutrition Type:  Nutrition Problem: Increased nutrient needs Etiology: post-op healing   Malnutrition Characteristics:  Signs/Symptoms: estimated needs   Nutrition Interventions:  Interventions: MVI, Premier Protein, Juven    Estimated body mass index is 32.68 kg/m as calculated from the following:   Height as of this encounter: 5\' 4"  (1.626 m).   Weight as of this encounter: 86.4 kg.     Code Status: Full  Family Communication: Discussed with patient  Disposition Plan: Status is: Inpatient  Remains inpatient appropriate  because:Inpatient level of care appropriate due to severity of illness   Dispo: The patient is from: Home              Anticipated d/c is to: Home              Anticipated d/c date is: 3 days               Patient currently is not medically stable to d/c.    Consultants:  General surgery  Procedures:  I&D on 03/07/2020  Antimicrobials:  Zosyn  Vancomycin  DVT prophylaxis: [place] Heparin   Objective: Vitals:   03/09/20 1940 03/10/20 0349 03/10/20 0734 03/10/20 1434  BP: (!) 116/52 127/77 (!) 138/58 115/60  Pulse: 72 78 88 92  Resp: 17 18 15 16   Temp: 98.1 F (36.7 C) 98.2 F (36.8 C) 99.1 F (37.3 C) 98.4 F (36.9 C)  TempSrc: Oral Oral Oral Oral  SpO2: 100% 99% 100% 100%  Weight:      Height:        Intake/Output Summary (Last 24 hours) at 03/10/2020 1530 Last data filed at 03/10/2020 1500 Gross per 24 hour  Intake 1070 ml  Output 2600 ml  Net -1530 ml   Filed Weights   03/06/20 2101  Weight: 86.4 kg    Exam:  General: NAD, awake, alert, oriented  Cardiovascular: S1, S2 present  Respiratory: CTAB  Abdomen: Soft, +tender, nondistended, bowel sounds present  Musculoskeletal: No bilateral pedal edema noted  Skin:  Please see picture from general surgery progress note  Psychiatry: Normal mood    Data Reviewed: CBC: Recent Labs  Lab 03/06/20 0603 03/07/20 0414 03/08/20 0319 03/09/20 0839 03/10/20 0317  WBC 27.7* 27.8* 27.9* 20.2* 19.3*  NEUTROABS  --  24.7* 25.8* 17.6* 17.6*  HGB 8.1* 7.8* 7.4* 7.1* 7.3*  HCT 26.6* 24.2* 23.4* 22.5* 23.2*  MCV 91.4 88.0 90.3 90.7 90.6  PLT 192 184 198 200 222   Basic Metabolic Panel: Recent Labs  Lab 03/07/20 0059 03/07/20 0414 03/08/20 0319 03/09/20 0839 03/10/20 0317  NA 138 140 141 135 139  K 4.7 4.3 4.6 4.2 4.0  CL 107 110 109 107 109  CO2 18* 19* 18* 21* 21*  GLUCOSE 280* 282* 419* 284* 129*  BUN 99* 91* 71* 71* 63*  CREATININE 2.10* 2.17* 2.07* 2.22* 1.81*  CALCIUM 8.9 9.0 8.6* 8.2* 8.4*   [name]: Estimated Creatinine Clearance: 31.2 mL/min (A) (by C-G formula based on SCr of 1.81 mg/dL (H)). Liver Function Tests: No results for input(s): AST, ALT, ALKPHOS, BILITOT, PROT, ALBUMIN in the last  168 hours. No results for input(s): LIPASE, AMYLASE in the last 168 hours. No results for input(s): AMMONIA in the last 168 hours. Coagulation Profile: No results for input(s): INR, PROTIME in the last 168 hours. Cardiac Enzymes: Recent Labs  Lab 03/05/20 1429  CKTOTAL 104   BNP (last 3 results) No results for input(s): PROBNP in the last 8760 hours. HbA1C: No results for input(s): HGBA1C in the last 72 hours. CBG: Recent Labs  Lab 03/09/20 1125 03/09/20 1651 03/09/20 1940 03/10/20 0646 03/10/20 1205  GLUCAP 317* 146* 158* 190* 136*   Lipid Profile: No results for input(s): CHOL, HDL, LDLCALC, TRIG, CHOLHDL, LDLDIRECT in the last 72 hours. Thyroid Function Tests: No results for input(s): TSH, T4TOTAL, FREET4, T3FREE, THYROIDAB in the last 72 hours. Anemia Panel: Recent Labs    03/09/20 0839  VITAMINB12 3,733*  FOLATE 10.1  FERRITIN 630*  TIBC 127*  IRON  30   Urine analysis:    Component Value Date/Time   COLORURINE YELLOW 03/05/2020 2040   APPEARANCEUR CLOUDY (A) 03/05/2020 2040   LABSPEC 1.010 03/05/2020 2040   PHURINE 5.0 03/05/2020 2040   GLUCOSEU >=500 (A) 03/05/2020 2040   GLUCOSEU NEGATIVE 10/03/2013 1004   HGBUR MODERATE (A) 03/05/2020 2040   BILIRUBINUR NEGATIVE 03/05/2020 2040   KETONESUR NEGATIVE 03/05/2020 2040   PROTEINUR NEGATIVE 03/05/2020 2040   UROBILINOGEN 0.2 10/03/2013 1004   NITRITE NEGATIVE 03/05/2020 2040   LEUKOCYTESUR LARGE (A) 03/05/2020 2040   Sepsis Labs: @LABRCNTIP (procalcitonin:4,lacticidven:4)  ) Recent Results (from the past 240 hour(s))  SARS Coronavirus 2 by RT PCR (hospital order, performed in [place] hospital lab) Nasopharyngeal Nasopharyngeal Swab     Status: None   Collection Time: 03/06/20  3:06 AM   Specimen: Nasopharyngeal Swab  Result Value Ref Range Status   SARS Coronavirus 2 NEGATIVE NEGATIVE Final    Comment: (NOTE) SARS-CoV-2 target nucleic acids are NOT DETECTED.  The SARS-CoV-2 RNA is generally  detectable in upper and lower respiratory specimens during the acute phase of infection. The lowest concentration of SARS-CoV-2 viral copies this assay can detect is 250 copies / mL. A negative result does not preclude SARS-CoV-2 infection and should not be used as the sole basis for treatment or other patient management decisions.  A negative result may occur with improper specimen collection / handling, submission of specimen other than nasopharyngeal swab, presence of viral mutation(s) within the areas targeted by this assay, and inadequate number of viral copies (<250 copies / mL). A negative result must be combined with clinical observations, patient history, and epidemiological information.  Fact Sheet for Patients:   StrictlyIdeas.no  Fact Sheet for Healthcare Providers: BankingDealers.co.za  This test is not yet approved or  cleared by the [place] FDA and has been authorized for detection and/or diagnosis of SARS-CoV-2 by FDA under an Emergency Use Authorization (EUA).  This EUA will remain in effect (meaning this test can be used) for the duration of the COVID-19 declaration under Section 564(b)(1) of the Act, 21 U.S.C. section 360bbb-3(b)(1), unless the authorization is terminated or revoked sooner.  Performed at [facility], [place] [address] [place], [place] [address]   Urine culture     Status: Abnormal   Collection Time: 03/06/20  4:50 AM   Specimen: Urine, Random  Result Value Ref Range Status   Specimen Description URINE, RANDOM  Final   Special Requests   Final    NONE Performed at [facility], [place] [address], [place],  [address]    Culture 60,000 COLONIES/mL ENTEROCOCCUS FAECALIS (A)  Final   Report Status 03/08/2020 FINAL  Final   Organism ID, Bacteria ENTEROCOCCUS FAECALIS (A)  Final      Susceptibility   Enterococcus faecalis - MIC*    AMPICILLIN <=2 SENSITIVE Sensitive      NITROFURANTOIN <=16 SENSITIVE Sensitive     VANCOMYCIN 1 SENSITIVE Sensitive     * 60,000 COLONIES/mL ENTEROCOCCUS FAECALIS  Culture, blood (routine x 2)     Status: None (Preliminary result)   Collection Time: 03/06/20  6:00 AM   Specimen: BLOOD RIGHT FOREARM  Result Value Ref Range Status   Specimen Description BLOOD RIGHT FOREARM  Final   Special Requests   Final    BOTTLES DRAWN AEROBIC AND ANAEROBIC Blood Culture adequate volume   Culture   Final    NO GROWTH 4 DAYS Performed at [facility],  [address], [place], [place] [address]    Report Status PENDING  Incomplete  Culture, blood (routine x 2)     Status: None (Preliminary result)   Collection Time: 03/06/20  8:33 AM   Specimen: BLOOD LEFT HAND  Result Value Ref Range Status   Specimen Description BLOOD LEFT HAND  Final   Special Requests   Final    BOTTLES DRAWN AEROBIC ONLY Blood Culture results may not be optimal due to an inadequate volume of blood received in culture bottles   Culture   Final    NO GROWTH 4 DAYS Performed at [facility], [place] [address], [place], [place] [address]    Report Status PENDING  Incomplete  MRSA PCR Screening     Status: None   Collection Time: 03/06/20 10:07 PM   Specimen: Nasal Mucosa; Nasopharyngeal  Result Value Ref Range Status   MRSA by PCR NEGATIVE NEGATIVE Final    Comment:        The GeneXpert MRSA Assay (FDA approved for NASAL specimens only), is one component of a comprehensive MRSA colonization surveillance program. It is not intended to diagnose MRSA infection nor to guide or monitor treatment for MRSA infections. Performed at [facility], [place] [address] [place], [place] [address]   Aerobic/Anaerobic Culture (surgical/deep wound)     Status: None (Preliminary result)   Collection Time: 03/07/20  4:07 PM   Specimen: PATH Other; Tissue  Result Value Ref Range Status   Specimen Description ABSCESS PERINEAL RIGHT  Final   Special Requests PT ON  ANCEF ZOSYN  Final   Gram Stain   Final    FEW WBC PRESENT, PREDOMINANTLY PMN ABUNDANT GRAM NEGATIVE RODS ABUNDANT GRAM POSITIVE COCCI IN PAIRS MODERATE GRAM POSITIVE RODS    Culture   Final    FEW ENTEROCOCCUS FAECALIS CULTURE REINCUBATED FOR BETTER GROWTH Performed at [facility], [place] [address], [place], [place] [address]    Report Status PENDING  Incomplete   Organism ID, Bacteria ENTEROCOCCUS FAECALIS  Final      Susceptibility   Enterococcus faecalis - MIC*    AMPICILLIN <=2 SENSITIVE Sensitive     VANCOMYCIN 1 SENSITIVE Sensitive     GENTAMICIN SYNERGY SENSITIVE Sensitive     * FEW ENTEROCOCCUS FAECALIS      Studies: No results found.  Scheduled Meds: . aspirin  81 mg Oral Daily  . calcium carbonate  1,250 mg Oral BID WC  . Chlorhexidine Gluconate Cloth  6 each Topical Once  . heparin  5,000 Units Subcutaneous Q8H  . insulin aspart  0-15 Units Subcutaneous TID WC  . insulin aspart  0-5 Units Subcutaneous QHS  . insulin aspart  8 Units Subcutaneous TID WC  . insulin glargine  15 Units Subcutaneous BID  . multivitamin with minerals  1 tablet Oral Daily  . nutrition supplement (JUVEN)  1 packet Oral BID BM  . pantoprazole  20 mg Oral Daily  . Ensure Max Protein  11 oz Oral QHS  . rosuvastatin  5 mg Oral Daily    Continuous Infusions: . lactated ringers 10 mL/hr at 03/07/20 1431  . piperacillin-tazobactam (ZOSYN)  IV 3.375 g (03/10/20 1305)  . vancomycin 1,000 mg (03/09/20 2242)     LOS: 4 days     Alma Friendly, MD [facility]  If 7PM-7AM, please contact night-coverage [email or web address] 03/10/2020, 3:30 PM

## 2020-03-11 LAB — CBC WITH DIFFERENTIAL/PLATELET
Abs Immature Granulocytes: 0.28 10*3/uL — ABNORMAL HIGH (ref 0.00–0.07)
Basophils Absolute: 0 10*3/uL (ref 0.0–0.1)
Basophils Relative: 0 %
Eosinophils Absolute: 0 10*3/uL (ref 0.0–0.5)
Eosinophils Relative: 0 %
HCT: 23.7 % — ABNORMAL LOW (ref 36.0–46.0)
Hemoglobin: 7.2 g/dL — ABNORMAL LOW (ref 12.0–15.0)
Immature Granulocytes: 2 %
Lymphocytes Relative: 6 %
Lymphs Abs: 1 10*3/uL (ref 0.7–4.0)
MCH: 28.2 pg (ref 26.0–34.0)
MCHC: 30.4 g/dL (ref 30.0–36.0)
MCV: 92.9 fL (ref 80.0–100.0)
Monocytes Absolute: 1.2 10*3/uL — ABNORMAL HIGH (ref 0.1–1.0)
Monocytes Relative: 7 %
Neutro Abs: 14.5 10*3/uL — ABNORMAL HIGH (ref 1.7–7.7)
Neutrophils Relative %: 85 %
Platelets: 181 10*3/uL (ref 150–400)
RBC: 2.55 MIL/uL — ABNORMAL LOW (ref 3.87–5.11)
RDW: 15.7 % — ABNORMAL HIGH (ref 11.5–15.5)
WBC: 17 10*3/uL — ABNORMAL HIGH (ref 4.0–10.5)
nRBC: 0.1 % (ref 0.0–0.2)

## 2020-03-11 LAB — CULTURE, BLOOD (ROUTINE X 2)
Culture: NO GROWTH
Culture: NO GROWTH
Special Requests: ADEQUATE

## 2020-03-11 LAB — BASIC METABOLIC PANEL
Anion gap: 6 (ref 5–15)
BUN: 55 mg/dL — ABNORMAL HIGH (ref 8–23)
CO2: 24 mmol/L (ref 22–32)
Calcium: 8.2 mg/dL — ABNORMAL LOW (ref 8.9–10.3)
Chloride: 109 mmol/L (ref 98–111)
Creatinine, Ser: 1.8 mg/dL — ABNORMAL HIGH (ref 0.44–1.00)
GFR calc Af Amer: 33 mL/min — ABNORMAL LOW (ref >60)
GFR calc non Af Amer: 28 mL/min — ABNORMAL LOW (ref >60)
Glucose, Bld: 132 mg/dL — ABNORMAL HIGH (ref 70–99)
Potassium: 3.7 mmol/L (ref 3.5–5.1)
Sodium: 139 mmol/L (ref 135–145)

## 2020-03-11 LAB — GLUCOSE, CAPILLARY
Glucose-Capillary: 114 mg/dL — ABNORMAL HIGH (ref 70–99)
Glucose-Capillary: 144 mg/dL — ABNORMAL HIGH (ref 70–99)
Glucose-Capillary: 231 mg/dL — ABNORMAL HIGH (ref 70–99)
Glucose-Capillary: 150 mg/dL — ABNORMAL HIGH (ref 70–99)

## 2020-03-11 LAB — VANCOMYCIN, TROUGH: Vancomycin Tr: 16 ug/mL (ref 15–20)

## 2020-03-11 NOTE — Progress Notes (Signed)
Physical Therapy Treatment Patient Details Name: [name] MRN: [id number] DOB: [birth year] Today's Date: 03/11/2020    History of Present Illness The pt is a 69 yo female presenting s/p I&D of perineum and pannus abcess on 9/2. She presented to the ED after a fall due to severe levels of pain from her abcesses. PMH includes: COPD, DM II, HLD< HTN, NSTEMI, renal insufficiency, and obesity.    PT Comments    The pt was in bed and agreeable to PT session this afternoon, but was significantly limited due to pain and rapid onset of fatigue. The pt was able to complete 2 exercises while sitting EOB before reporting she was too fatigued to continue and unable to complete any OOB transfers or sit-stands at this time. The pt will continue to benefit from skilled PT to progress functional activity tolerance and strength, but at this time will need continued rehab prior to return home due to her current limitations in mobility, activity tolerance, and significant pain levels.     Follow Up Recommendations  SNF;Supervision/Assistance - 24 hour     Equipment Recommendations   (defer to post acute)    Recommendations for Other Services       Precautions / Restrictions Precautions Precautions: Fall Precaution Comments: pt reports no other falls in 6 months Restrictions Weight Bearing Restrictions: No    Mobility  Bed Mobility Overal bed mobility: Needs Assistance Bed Mobility: Sit to Supine;Sit to Sidelying;Supine to Sit     Supine to sit: Min assist;HOB elevated Sit to supine: Min assist   General bed mobility comments: minA for movement of BLE to EOB and elevation of trunk from elevated HOB. pt able to initiate return of BLE into bed  Transfers Overall transfer level: Needs assistance Equipment used: None Transfers: Lateral/Scoot Transfers          Lateral/Scoot Transfers: Mod assist General transfer comment: pt declined OOB transfers due to fatigue and pain, lateral  scoot along EOB prior to return to supine with modA and use of bed pad  Ambulation/Gait - pt unable                    Balance Overall balance assessment: Needs assistance Sitting-balance support: Single extremity supported;Feet supported Sitting balance-Leahy Scale: Fair Sitting balance - Comments: minA initially, progressed to minG once pt adjusted herself at EOB. able to complete exercises at EOB without LOB Postural control: Right lateral lean Standing balance support: Bilateral upper extremity supported Standing balance-Leahy Scale: Fair Standing balance comment: with RW                            Cognition Arousal/Alertness: Awake/alert Behavior During Therapy: WFL for tasks assessed/performed Overall Cognitive Status: Within Functional Limits for tasks assessed                                 General Comments: pt with flat affect but cooperative and agreeable through session. conversationally [facility]      Exercises General Exercises - Lower Extremity Long Arc Quad: AROM;Both;5 reps;Seated Heel Raises: AROM;Both;10 reps;Seated    General Comments General comments (skin integrity, edema, etc.): RN just changed dressing and cleaned pt up prior to PT session, no noteable drainage on pads at this time      Pertinent Vitals/Pain Pain Assessment: Faces Faces Pain Scale: Hurts little more Pain Location: buttocks/groin Pain Descriptors /  Indicators: Aching;Grimacing;Guarding;Tender;Throbbing Pain Intervention(s): Limited activity within patient's tolerance;Monitored during session;Repositioned     PT Goals (current goals can now be found in the care plan section) Acute Rehab PT Goals Patient Stated Goal: to reduce pain and return home PT Goal Formulation: With patient Time For Goal Achievement: 03/22/20 Potential to Achieve Goals: Good Progress towards PT goals: Not progressing toward goals - comment (pt with increased pain and more rapid  onset of fatigue. Unable to complete more than 2 exercises sitting EOB)    Frequency    Min 3X/week      PT Plan Current plan remains appropriate       AM-PAC PT "6 Clicks" Mobility   Outcome Measure  Help needed turning from your back to your side while in a flat bed without using bedrails?: A Little Help needed moving from lying on your back to sitting on the side of a flat bed without using bedrails?: A Lot Help needed moving to and from a bed to a chair (including a wheelchair)?: A Lot Help needed standing up from a chair using your arms (e.g., wheelchair or bedside chair)?: A Lot Help needed to walk in hospital room?: A Lot Help needed climbing 3-5 steps with a railing? : Total 6 Click Score: 12    End of Session Equipment Utilized During Treatment: Gait belt Activity Tolerance: Patient tolerated treatment well;Patient limited by fatigue Patient left: with call bell/phone within reach;in bed;with bed alarm set Nurse Communication: Mobility status PT Visit Diagnosis: Difficulty in walking, not elsewhere classified (R26.2);Pain Pain - Right/Left: Left Pain - part of body:  (lower abdomen and incision site)     Time: 6415-8309 PT Time Calculation (min) (ACUTE ONLY): 23 min  Charges:  $Therapeutic Exercise: 8-22 mins $Therapeutic Activity: 8-22 mins                     [name], PT, DPT   Acute Rehabilitation Department Pager #: [phone]   Otho Bellows 03/11/2020, 5:41 PM

## 2020-03-11 NOTE — Progress Notes (Signed)
PT Cancellation Note  Patient Details Name: [name] MRN: [id number] DOB: 20-Oct-1950   Cancelled Treatment:    Reason Eval/Treat Not Completed: Patient declined, no reason specified;Pain limiting ability to participate. The pt was initially agreeable to therapy this morning, but upon initial attempts at bed mobility, the pt reported her soreness in her lower abdomen and wound sites was too much to continue with mobility at this time. The pt was educated on role of PT in determining d/c plan and importance of continued mobility and continuing to work with PT to meet her goal of d/c home rather than short-term rehab, but the pt asked for PT to return at a later time. PT will continue to follow and treat as time/schedule allow.   [name], PT, DPT   Acute Rehabilitation Department Pager #: [phone]   Otho Bellows 03/11/2020, 10:01 AM

## 2020-03-11 NOTE — Progress Notes (Signed)
[facility] Progress Note  4 Days Post-Op  Subjective: CC-  Tired this morning and just wants to rest. States that she is still sore from surgery but the pain is a little less. Tolerating dressing changes well. WBC down 17, afebrile.  Objective: Vital signs in last 24 hours: Temp:  [98 F (36.7 C)-98.6 F (37 C)] 98.6 F (37 C) (09/06 0754) Pulse Rate:  [79-92] 79 (09/06 0754) Resp:  [16-17] 17 (09/06 0754) BP: (115-140)/(55-72) 132/55 (09/06 0754) SpO2:  [100 %] 100 % (09/06 0754) Last BM Date: 03/10/20  Intake/Output from previous day: 09/05 0701 - 09/06 0700 In: 1029.6 [P.O.:320; I.V.:370; IV Piggyback:339.6] Out: 2650 [Urine:2650] Intake/Output this shift: No intake/output data recorded.  PE: Gen:  Alert, NAD, pleasant Pulm:  rate and effort normal Abd: Soft, NT/ND, area right lower abdomen with some induration/ no cellulitis (patient states this is where she's getting lovenox injection) -Left thigh: open wound with trace purulent drainage at base, no surrounding cellulitis  -Posterior left thigh/perineum: 2 opens with persistent purulent drainage but this is improved from yesterday, no cellulitis or induration  Lab Results:  Recent Labs    03/10/20 0317 03/11/20 0357  WBC 19.3* 17.0*  HGB 7.3* 7.2*  HCT 23.2* 23.7*  PLT 180 181   BMET Recent Labs    03/10/20 0317 03/11/20 0357  NA 139 139  K 4.0 3.7  CL 109 109  CO2 21* 24  GLUCOSE 129* 132*  BUN 63* 55*  CREATININE 1.81* 1.80*  CALCIUM 8.4* 8.2*   PT/INR No results for input(s): LABPROT, INR in the last 72 hours. CMP     Component Value Date/Time   NA 139 03/11/2020 0357   NA 138 03/29/2018 1042   K 3.7 03/11/2020 0357   CL 109 03/11/2020 0357   CO2 24 03/11/2020 0357   GLUCOSE 132 (H) 03/11/2020 0357   BUN 55 (H) 03/11/2020 0357   BUN 41 (H) 03/29/2018 1042   CREATININE 1.80 (H) 03/11/2020 0357   CREATININE 2.16 (H) 01/13/2017 1146   CALCIUM 8.2 (L) 03/11/2020 0357   PROT  7.8 01/10/2019 1018   PROT 6.6 03/29/2018 1042   ALBUMIN 4.4 01/10/2019 1018   ALBUMIN 4.2 03/29/2018 1042   AST 12 01/10/2019 1018   ALT 12 01/10/2019 1018   ALKPHOS 112 01/10/2019 1018   BILITOT 0.4 01/10/2019 1018   BILITOT 0.2 03/29/2018 1042   GFRNONAA 28 (L) 03/11/2020 0357   GFRNONAA 23 (L) 01/13/2017 1146   GFRAA 33 (L) 03/11/2020 0357   GFRAA 27 (L) 01/13/2017 1146   Lipase  No results found for: LIPASE     Studies/Results: No results found.  Anti-infectives: Anti-infectives (From admission, onward)   Start     Dose/Rate Route Frequency Ordered Stop   03/07/20 1430  ceFAZolin (ANCEF) IVPB 2g/100 mL premix        2 g 200 mL/hr over 30 Minutes Intravenous On call to O.R. 03/07/20 1415 03/07/20 1625   03/07/20 0300  cefTRIAXone (ROCEPHIN) 1 g in sodium chloride 0.9 % 100 mL IVPB  Status:  Discontinued        1 g 200 mL/hr over 30 Minutes Intravenous Every 24 hours 03/06/20 0438 03/06/20 2214   03/06/20 2245  vancomycin (VANCOCIN) IVPB 1000 mg/200 mL premix        1,000 mg 200 mL/hr over 60 Minutes Intravenous Every 24 hours 03/06/20 2233     03/06/20 2230  piperacillin-tazobactam (ZOSYN) IVPB 3.375 g  3.375 g 12.5 mL/hr over 240 Minutes Intravenous Every 8 hours 03/06/20 2226     03/06/20 2215  piperacillin-tazobactam (ZOSYN) IVPB 4.5 g  Status:  Discontinued        4.5 g 200 mL/hr over 30 Minutes Intravenous Every 8 hours 03/06/20 2214 03/06/20 2224   03/06/20 0215  cefTRIAXone (ROCEPHIN) 1 g in sodium chloride 0.9 % 100 mL IVPB        1 g 200 mL/hr over 30 Minutes Intravenous  Once 03/06/20 0214 03/06/20 0336       Assessment/Plan DM CKD4 HTN ICM Obesity BMI 32.68 UTI  Right perineal abscess complex multiloculated S/pIncision drainage right perineal abscess complex9/2 Dr. Brantley Stage -POD #4 - culture ENTEROCOCCUS FAECALIS, reincubated, report pending - WBC down 27.9>>20.2>>19.3>>17, afebrile - Wounds still with some purulent drainage but  this is less today. Continue BID wet to dry dressing changes (3 separate wounds). Shower daily with wounds open. Continue IV antibiotics and follow culture.   ID -zosyn 9/1>>, vancomcyin 9/1>> FEN -HH/CM diet VTE -SCDs, sq heparin Foley -in place Follow up -DOW vs wound clinic    LOS: 5 days    Wellington Hampshire, [facility] Surgery 03/11/2020, 9:40 AM Please see Amion for pager number during day hours 7:00am-4:30pm

## 2020-03-11 NOTE — Progress Notes (Signed)
Pharmacy Antibiotic Note  [name] is a 69 y.o. female admitted on 03/05/2020 with necrotizing fascitis.  Pharmacy has been consulted for vancomycin dosing. Patient's renal function has remained relatively consistent since admission. Will plan to continue current antibiotic dosing and reevaluate renal function and levels as needed.  Vancomycin trough = 29 mcg/ml - drawn while Vancomycin dose hanging so not accurate.  Plan: Continue Vancomycin 1g IV q 24 hrs Continue Zosyn 3.375g IV q 8 hrs - ordered by MD. F/u cultures, renal function and clinical course. Will try for another Vanc trough 9/6 tonight  Height: 5\' 4"  (162.6 cm) Weight: 86.4 kg (190 lb 6.4 oz) IBW/kg (Calculated) : 54.7  Temp (24hrs), Avg:98.6 F (37 C), Min:98.2 F (36.8 C), Max:99.1 F (37.3 C)  Recent Labs  Lab 03/06/20 0603 03/06/20 0604 03/06/20 0605 03/06/20 7416 03/06/20 1433 03/07/20 0059 03/07/20 0414 03/08/20 0319 03/09/20 0839 03/10/20 0317 03/10/20 2213  WBC 27.7*  --   --   --   --   --  27.8* 27.9* 20.2* 19.3*  --   CREATININE  --   --    < >  --    < > 2.10* 2.17* 2.07* 2.22* 1.81*  --   LATICACIDVEN  --  2.9*  --  2.2*  --  2.0* 1.7  --   --   --   --   VANCOTROUGH  --   --   --   --   --   --   --   --   --   --  29*   < > = values in this interval not displayed.    Estimated Creatinine Clearance: 31.2 mL/min (A) (by C-G formula based on SCr of 1.81 mg/dL (H)).    Allergies  Allergen Reactions  . Tradjenta [Linagliptin] Anaphylaxis    CP  . Atorvastatin     REACTION: aches and pains  . Enalapril Maleate     REACTION: cough  . Farxiga [Dapagliflozin] Itching  . Hydrochlorothiazide     REACTION: hair loss  . Kenalog [Triamcinolone Acetonide]     HANDS NUMB   . Metformin And Related     Diarrhea, dizziness  . Propoxyphene N-Acetaminophen Hives  . Simvastatin     REACTION: cramps  . Spironolactone     REACTION: cramps    Vanc 9/2 >> Zosyn 9/2 >> CTX x1  9/1  9/1 MRSA PCR - negative 9/1 UCx - 60K E.faecalis 9/1 BCx - NGTD 9/2 right perineal abscess (deep) - few E. Faecalis   Thank you for allowing pharmacy to be a part of this patient's care.  Sherlon Handing, PharmD, BCPS Please see amion for complete clinical pharmacist phone list 03/11/2020 12:06 AM

## 2020-03-11 NOTE — Progress Notes (Signed)
Pharmacy Antibiotic Note  [name] is a 69 y.o. female admitted on 03/05/2020 with necrotizing fascitis.  Pharmacy has been consulted for vancomycin dosing. Patient's renal function has remained relatively consistent since admission. Will plan to continue current antibiotic dosing and reevaluate renal function and levels as needed.  Vancomycin trough 16 mcg/ml (therapeutic)  Plan: Continue Vancomycin 1g IV q 24 hrs Continue Zosyn 3.375g IV q 8 hrs - ordered by MD. F/u cultures, renal function and clinical course.  Height: 5\' 4"  (162.6 cm) Weight: 86.4 kg (190 lb 6.4 oz) IBW/kg (Calculated) : 54.7  Temp (24hrs), Avg:98.4 F (36.9 C), Min:98 F (36.7 C), Max:98.6 F (37 C)  Recent Labs  Lab 03/06/20 0603 03/06/20 0604 03/06/20 0605 03/06/20 2035 03/06/20 1433 03/07/20 0059 03/07/20 0414 03/08/20 0319 03/09/20 0839 03/10/20 0317 03/10/20 2213 03/11/20 0357 03/11/20 2216  WBC   < >  --   --   --   --   --  27.8* 27.9* 20.2* 19.3*  --  17.0*  --   CREATININE   < >  --    < >  --    < > 2.10* 2.17* 2.07* 2.22* 1.81*  --  1.80*  --   LATICACIDVEN  --  2.9*  --  2.2*  --  2.0* 1.7  --   --   --   --   --   --   VANCOTROUGH  --   --   --   --   --   --   --   --   --   --  29*  --  16   < > = values in this interval not displayed.    Estimated Creatinine Clearance: 31.4 mL/min (A) (by C-G formula based on SCr of 1.8 mg/dL (H)).    Allergies  Allergen Reactions  . Tradjenta [Linagliptin] Anaphylaxis    CP  . Atorvastatin     REACTION: aches and pains  . Enalapril Maleate     REACTION: cough  . Farxiga [Dapagliflozin] Itching  . Hydrochlorothiazide     REACTION: hair loss  . Kenalog [Triamcinolone Acetonide]     HANDS NUMB   . Metformin And Related     Diarrhea, dizziness  . Propoxyphene N-Acetaminophen Hives  . Simvastatin     REACTION: cramps  . Spironolactone     REACTION: cramps    Vanc 9/2 >> Zosyn 9/2 >> CTX x1 9/1  9/1 MRSA PCR -  negative 9/1 UCx - 60K E.faecalis 9/1 BCx - Negative 9/2 right perineal abscess (deep) - few E. Faecalis   Thank you for allowing pharmacy to be a part of this patient's care.  Sherlon Handing, PharmD, BCPS Please see amion for complete clinical pharmacist phone list 03/11/2020 11:35 PM

## 2020-03-11 NOTE — Progress Notes (Signed)
PROGRESS NOTE  [name] BMW:[id number] DOB: 1951-04-30 DOA: 03/05/2020 PCP: [name], MD  HPI/Recap of past 24 hours: HPI from [name] [name] is a 69 y.o. 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 Pt in to ED with EMS after laying on floor all night following a fall at home.  Pt states she is in so much pain from multiple "abscesses in her groin area" that she fell.  Pt hasnt taken any meds in a week it seems.  CBG reading high. ED Course: WBC 31k, has cellulitis under abdomen and groin area, no drainable abscesses per EDP.  Also has UTI.  Creat 2.7 which is about baseline, BUN of 130 which is not. Bicarb of 16, anion gap of 16 and BHB of 1.98. BGL 674. Pt started on insulin gtt.  Given 1L LR bolus.  Patient admitted for further management.    Today, patient still complains of postop abdominal/groin pain.  Denies any other new complaints.     Assessment/Plan: Principal Problem:   Cellulitis Active Problems:   Controlled type 2 diabetes mellitus with circulatory disorder, without long-term current use of insulin (HCC)   Hypertension associated with diabetes ([place])   Chronic combined systolic and diastolic CHF (congestive heart failure) ([place])   CKD stage 4 due to type 2 diabetes mellitus ([place])   Hyperglycemia   Acute lower UTI   DKA, type 2 (HCC)   Sepsis ([place])   Sepsis 2/2 perineal abscesses with widespread cellulitis of the pannus, right groin region s/p I&D on 03/07/2020 On admission, noted leukocytosis, tachypnea, lactic acidosis Currently afebrile, with leukocytosis BC x2 NGTD Deep surgical wound culture growing E faecalis Chest x-ray showed minimal left basilar opacity, likely atelectasis versus mild pneumonia CT abdomen/pelvis showed extensive soft tissue stranding and thickening extending across the lower anterior abdominal wall, concerning for severe cellulitis and aggressive soft tissue  infection General surgery consulted, further wound care as per general surgery Continue vancomycin, Zosyn Monitor closely  DKA/DM type 2, uncontrolled A1c on 01/10/2020 was 7.2 S/p insulin drip, with improvement in CBG and closed anion gap, although still remains acidotic which could likely be from worsening kidney function/sepsis SSI, Lantus, NovoLog 3 times daily, Accu-Cheks, hypoglycemic protocol Monitor closely  Enterococcus faecalis UTI UA with moderate hemoglobin, large leukocytes, few bacteria, WBC 21-50 UC grew 60,000 E faecalis Continue vancomycin  Hypertension BP soft Continue to hold home Bystolic, amlodipine, hydralazine for now  CKD stage IV/acidosis Creatinine at baseline Hold home torsemide for now Strict I's and O's Monitor closely  Anemia of chronic kidney disease Baseline hemoglobin 8-10 Anemia panel showed iron 30, sats 24%, ferritin 630, folate 7, vitamin B12 over 3000 No evidence of bleeding Daily CBC  Chronic diastolic HF/history of ischemic cardiomyopathy History of stent placement Echo done on 05/2019 showed EF of 60 to 27%, grade 2 diastolic dysfunction Monitor closely  Obesity Lifestyle modification advised        Malnutrition Type:  Nutrition Problem: Increased nutrient needs Etiology: post-op healing   Malnutrition Characteristics:  Signs/Symptoms: estimated needs   Nutrition Interventions:  Interventions: MVI, Premier Protein, Juven    Estimated body mass index is 32.68 kg/m as calculated from the following:   Height as of this encounter: 5\' 4"  (1.626 m).   Weight as of this encounter: 86.4 kg.     Code Status: Full  Family Communication: Discussed with patient  Disposition Plan: Status is: Inpatient  Remains inpatient  appropriate because:Inpatient level of care appropriate due to severity of illness   Dispo: The patient is from: Home              Anticipated d/c is to: Home              Anticipated d/c date is:  3 days              Patient currently is not medically stable to d/c.    Consultants:  General surgery  Procedures:  I&D on 03/07/2020  Antimicrobials:  Zosyn  Vancomycin  DVT prophylaxis: [place] Heparin   Objective: Vitals:   03/10/20 1933 03/11/20 0534 03/11/20 0754 03/11/20 1431  BP: 139/61 140/72 (!) 132/55 128/61  Pulse: 80 82 79 88  Resp: 17 16 17 17   Temp: 98.4 F (36.9 C) 98 F (36.7 C) 98.6 F (37 C) 98.4 F (36.9 C)  TempSrc: Oral Oral Oral Oral  SpO2: 100% 100% 100% 100%  Weight:      Height:        Intake/Output Summary (Last 24 hours) at 03/11/2020 1634 Last data filed at 03/11/2020 1616 Gross per 24 hour  Intake 809.59 ml  Output 2650 ml  Net -1840.41 ml   Filed Weights   03/06/20 2101  Weight: 86.4 kg    Exam:  General: NAD, awake, alert, oriented  Cardiovascular: S1, S2 present  Respiratory: CTAB  Abdomen: Soft, +tender, nondistended, bowel sounds present  Musculoskeletal: No bilateral pedal edema noted  Skin:  Please see picture from general surgery progress note  Psychiatry: Normal mood    Data Reviewed: CBC: Recent Labs  Lab 03/07/20 0414 03/08/20 0319 03/09/20 0839 03/10/20 0317 03/11/20 0357  WBC 27.8* 27.9* 20.2* 19.3* 17.0*  NEUTROABS 24.7* 25.8* 17.6* 17.6* 14.5*  HGB 7.8* 7.4* 7.1* 7.3* 7.2*  HCT 24.2* 23.4* 22.5* 23.2* 23.7*  MCV 88.0 90.3 90.7 90.6 92.9  PLT 184 198 200 180 563   Basic Metabolic Panel: Recent Labs  Lab 03/07/20 0414 03/08/20 0319 03/09/20 0839 03/10/20 0317 03/11/20 0357  NA 140 141 135 139 139  K 4.3 4.6 4.2 4.0 3.7  CL 110 109 107 109 109  CO2 19* 18* 21* 21* 24  GLUCOSE 282* 419* 284* 129* 132*  BUN 91* 71* 71* 63* 55*  CREATININE 2.17* 2.07* 2.22* 1.81* 1.80*  CALCIUM 9.0 8.6* 8.2* 8.4* 8.2*   [name]: Estimated Creatinine Clearance: 31.4 mL/min (A) (by C-G formula based on SCr of 1.8 mg/dL (H)). Liver Function Tests: No results for input(s): AST, ALT, ALKPHOS, BILITOT, PROT,  ALBUMIN in the last 168 hours. No results for input(s): LIPASE, AMYLASE in the last 168 hours. No results for input(s): AMMONIA in the last 168 hours. Coagulation Profile: No results for input(s): INR, PROTIME in the last 168 hours. Cardiac Enzymes: Recent Labs  Lab 03/05/20 1429  CKTOTAL 104   BNP (last 3 results) No results for input(s): PROBNP in the last 8760 hours. HbA1C: No results for input(s): HGBA1C in the last 72 hours. CBG: Recent Labs  Lab 03/10/20 1638 03/10/20 2102 03/11/20 0640 03/11/20 1117 03/11/20 1558  GLUCAP 141* 107* 114* 144* 231*   Lipid Profile: No results for input(s): CHOL, HDL, LDLCALC, TRIG, CHOLHDL, LDLDIRECT in the last 72 hours. Thyroid Function Tests: No results for input(s): TSH, T4TOTAL, FREET4, T3FREE, THYROIDAB in the last 72 hours. Anemia Panel: Recent Labs    03/09/20 0839  VITAMINB12 3,733*  FOLATE 10.1  FERRITIN 630*  TIBC 127*  IRON 30  Urine analysis:    Component Value Date/Time   COLORURINE YELLOW 03/05/2020 2040   APPEARANCEUR CLOUDY (A) 03/05/2020 2040   LABSPEC 1.010 03/05/2020 2040   PHURINE 5.0 03/05/2020 2040   GLUCOSEU >=500 (A) 03/05/2020 2040   GLUCOSEU NEGATIVE 10/03/2013 1004   HGBUR MODERATE (A) 03/05/2020 2040   BILIRUBINUR NEGATIVE 03/05/2020 2040   KETONESUR NEGATIVE 03/05/2020 2040   PROTEINUR NEGATIVE 03/05/2020 2040   UROBILINOGEN 0.2 10/03/2013 1004   NITRITE NEGATIVE 03/05/2020 2040   LEUKOCYTESUR LARGE (A) 03/05/2020 2040   Sepsis Labs: @LABRCNTIP (procalcitonin:4,lacticidven:4)  ) Recent Results (from the past 240 hour(s))  SARS Coronavirus 2 by RT PCR (hospital order, performed in [place] hospital lab) Nasopharyngeal Nasopharyngeal Swab     Status: None   Collection Time: 03/06/20  3:06 AM   Specimen: Nasopharyngeal Swab  Result Value Ref Range Status   SARS Coronavirus 2 NEGATIVE NEGATIVE Final    Comment: (NOTE) SARS-CoV-2 target nucleic acids are NOT DETECTED.  The SARS-CoV-2  RNA is generally detectable in upper and lower respiratory specimens during the acute phase of infection. The lowest concentration of SARS-CoV-2 viral copies this assay can detect is 250 copies / mL. A negative result does not preclude SARS-CoV-2 infection and should not be used as the sole basis for treatment or other patient management decisions.  A negative result may occur with improper specimen collection / handling, submission of specimen other than nasopharyngeal swab, presence of viral mutation(s) within the areas targeted by this assay, and inadequate number of viral copies (<250 copies / mL). A negative result must be combined with clinical observations, patient history, and epidemiological information.  Fact Sheet for Patients:   StrictlyIdeas.no  Fact Sheet for Healthcare Providers: BankingDealers.co.za  This test is not yet approved or  cleared by the [place] FDA and has been authorized for detection and/or diagnosis of SARS-CoV-2 by FDA under an Emergency Use Authorization (EUA).  This EUA will remain in effect (meaning this test can be used) for the duration of the COVID-19 declaration under Section 564(b)(1) of the Act, 21 U.S.C. section 360bbb-3(b)(1), unless the authorization is terminated or revoked sooner.  Performed at [facility], [place] [address] [place], [place] [address]   Urine culture     Status: Abnormal   Collection Time: 03/06/20  4:50 AM   Specimen: Urine, Random  Result Value Ref Range Status   Specimen Description URINE, RANDOM  Final   Special Requests   Final    NONE Performed at [facility], [place] [address] [place], [place] [address]    Culture 60,000 COLONIES/mL ENTEROCOCCUS FAECALIS (A)  Final   Report Status 03/08/2020 FINAL  Final   Organism ID, Bacteria ENTEROCOCCUS FAECALIS (A)  Final      Susceptibility   Enterococcus faecalis - MIC*    AMPICILLIN <=2 SENSITIVE  Sensitive     NITROFURANTOIN <=16 SENSITIVE Sensitive     VANCOMYCIN 1 SENSITIVE Sensitive     * 60,000 COLONIES/mL ENTEROCOCCUS FAECALIS  Culture, blood (routine x 2)     Status: None   Collection Time: 03/06/20  6:00 AM   Specimen: BLOOD RIGHT FOREARM  Result Value Ref Range Status   Specimen Description BLOOD RIGHT FOREARM  Final   Special Requests   Final    BOTTLES DRAWN AEROBIC AND ANAEROBIC Blood Culture adequate volume   Culture   Final    NO GROWTH 5 DAYS Performed at [facility], [place],  [place] [address]    Report Status 03/11/2020 FINAL  Final  Culture, blood (routine x 2)     Status: None   Collection Time: 03/06/20  8:33 AM   Specimen: BLOOD LEFT HAND  Result Value Ref Range Status   Specimen Description BLOOD LEFT HAND  Final   Special Requests   Final    BOTTLES DRAWN AEROBIC ONLY Blood Culture results may not be optimal due to an inadequate volume of blood received in culture bottles   Culture   Final    NO GROWTH 5 DAYS Performed at [facility], [place] [address] [place], [place] [address]    Report Status 03/11/2020 FINAL  Final  MRSA PCR Screening     Status: None   Collection Time: 03/06/20 10:07 PM   Specimen: Nasal Mucosa; Nasopharyngeal  Result Value Ref Range Status   MRSA by PCR NEGATIVE NEGATIVE Final    Comment:        The GeneXpert MRSA Assay (FDA approved for NASAL specimens only), is one component of a comprehensive MRSA colonization surveillance program. It is not intended to diagnose MRSA infection nor to guide or monitor treatment for MRSA infections. Performed at [facility], [place] [address] [place], [place] [address]   Aerobic/Anaerobic Culture (surgical/deep wound)     Status: None (Preliminary result)   Collection Time: 03/07/20  4:07 PM   Specimen: PATH Other; Tissue  Result Value Ref Range Status   Specimen Description ABSCESS PERINEAL RIGHT  Final   Special Requests PT ON ANCEF ZOSYN  Final    Gram Stain   Final    FEW WBC PRESENT, PREDOMINANTLY PMN ABUNDANT GRAM NEGATIVE RODS ABUNDANT GRAM POSITIVE COCCI IN PAIRS MODERATE GRAM POSITIVE RODS Performed at [facility], [place] [address], [place],  [address]    Culture   Final    FEW ENTEROCOCCUS FAECALIS NO ANAEROBES ISOLATED; CULTURE IN PROGRESS FOR 5 DAYS    Report Status PENDING  Incomplete   Organism ID, Bacteria ENTEROCOCCUS FAECALIS  Final      Susceptibility   Enterococcus faecalis - MIC*    AMPICILLIN <=2 SENSITIVE Sensitive     VANCOMYCIN 1 SENSITIVE Sensitive     GENTAMICIN SYNERGY SENSITIVE Sensitive     * FEW ENTEROCOCCUS FAECALIS      Studies: No results found.  Scheduled Meds: . aspirin  81 mg Oral Daily  . calcium carbonate  1,250 mg Oral BID WC  . Chlorhexidine Gluconate Cloth  6 each Topical Once  . heparin  5,000 Units Subcutaneous Q8H  . insulin aspart  0-15 Units Subcutaneous TID WC  . insulin aspart  0-5 Units Subcutaneous QHS  . insulin aspart  8 Units Subcutaneous TID WC  . insulin glargine  15 Units Subcutaneous BID  . multivitamin with minerals  1 tablet Oral Daily  . nutrition supplement (JUVEN)  1 packet Oral BID BM  . pantoprazole  20 mg Oral Daily  . Ensure Max Protein  11 oz Oral QHS  . rosuvastatin  5 mg Oral Daily    Continuous Infusions: . lactated ringers 10 mL/hr at 03/07/20 1431  . piperacillin-tazobactam (ZOSYN)  IV 3.375 g (03/11/20 1554)  . vancomycin 1,000 mg (03/10/20 2146)     LOS: 5 days     Alma Friendly, MD [facility]  If 7PM-7AM, please contact night-coverage [email or web address] 03/11/2020, 4:34 PM

## 2020-03-11 NOTE — Plan of Care (Signed)

## 2020-03-12 LAB — GLUCOSE, CAPILLARY
Glucose-Capillary: 105 mg/dL — ABNORMAL HIGH (ref 70–99)
Glucose-Capillary: 99 mg/dL (ref 70–99)
Glucose-Capillary: 108 mg/dL — ABNORMAL HIGH (ref 70–99)
Glucose-Capillary: 156 mg/dL — ABNORMAL HIGH (ref 70–99)
Glucose-Capillary: 143 mg/dL — ABNORMAL HIGH (ref 70–99)

## 2020-03-12 LAB — HEMOGLOBIN AND HEMATOCRIT, BLOOD
HCT: 27.9 % — ABNORMAL LOW (ref 36.0–46.0)
Hemoglobin: 8.9 g/dL — ABNORMAL LOW (ref 12.0–15.0)

## 2020-03-12 LAB — BASIC METABOLIC PANEL
Anion gap: 9 (ref 5–15)
BUN: 50 mg/dL — ABNORMAL HIGH (ref 8–23)
CO2: 22 mmol/L (ref 22–32)
Calcium: 8 mg/dL — ABNORMAL LOW (ref 8.9–10.3)
Chloride: 108 mmol/L (ref 98–111)
Creatinine, Ser: 1.78 mg/dL — ABNORMAL HIGH (ref 0.44–1.00)
GFR calc Af Amer: 33 mL/min — ABNORMAL LOW (ref >60)
GFR calc non Af Amer: 29 mL/min — ABNORMAL LOW (ref >60)
Glucose, Bld: 136 mg/dL — ABNORMAL HIGH (ref 70–99)
Potassium: 3.7 mmol/L (ref 3.5–5.1)
Sodium: 139 mmol/L (ref 135–145)

## 2020-03-12 LAB — CBC WITH DIFFERENTIAL/PLATELET
Abs Immature Granulocytes: 0.19 10*3/uL — ABNORMAL HIGH (ref 0.00–0.07)
Basophils Absolute: 0 10*3/uL (ref 0.0–0.1)
Basophils Relative: 0 %
Eosinophils Absolute: 0 10*3/uL (ref 0.0–0.5)
Eosinophils Relative: 0 %
HCT: 21.9 % — ABNORMAL LOW (ref 36.0–46.0)
Hemoglobin: 6.6 g/dL — CL (ref 12.0–15.0)
Immature Granulocytes: 1 %
Lymphocytes Relative: 5 %
Lymphs Abs: 0.7 10*3/uL (ref 0.7–4.0)
MCH: 28.2 pg (ref 26.0–34.0)
MCHC: 30.1 g/dL (ref 30.0–36.0)
MCV: 93.6 fL (ref 80.0–100.0)
Monocytes Absolute: 0.8 10*3/uL (ref 0.1–1.0)
Monocytes Relative: 6 %
Neutro Abs: 12.2 10*3/uL — ABNORMAL HIGH (ref 1.7–7.7)
Neutrophils Relative %: 88 %
Platelets: 184 10*3/uL (ref 150–400)
RBC: 2.34 MIL/uL — ABNORMAL LOW (ref 3.87–5.11)
RDW: 15.8 % — ABNORMAL HIGH (ref 11.5–15.5)
WBC: 13.9 10*3/uL — ABNORMAL HIGH (ref 4.0–10.5)
nRBC: 0.1 % (ref 0.0–0.2)

## 2020-03-12 LAB — PREPARE RBC (CROSSMATCH)

## 2020-03-12 MED ORDER — AMOXICILLIN 500 MG PO CAPS
500.0000 mg | ORAL_CAPSULE | Freq: Two times a day (BID) | ORAL | Status: DC
  Administered 2020-03-12 – 2020-03-13 (×3): 500 mg via ORAL
  Filled 2020-03-12 (×4): qty 1

## 2020-03-12 MED ORDER — SODIUM CHLORIDE 0.9% IV SOLUTION: Freq: Once | INTRAVENOUS | Status: AC

## 2020-03-12 MED ORDER — PROSOURCE PLUS PO LIQD
30.0000 mL | Freq: Two times a day (BID) | ORAL | Status: DC
  Administered 2020-03-12 – 2020-03-13 (×2): 30 mL via ORAL
  Filled 2020-03-12 (×3): qty 30

## 2020-03-12 MED ORDER — AMLODIPINE BESYLATE 5 MG PO TABS
5.0000 mg | ORAL_TABLET | Freq: Every day | ORAL | Status: DC
  Administered 2020-03-12 – 2020-03-14 (×3): 5 mg via ORAL
  Filled 2020-03-12 (×3): qty 1

## 2020-03-12 NOTE — Progress Notes (Signed)
PROGRESS NOTE  [name] KZL:[id number] DOB: 31-Jan-1951 DOA: 03/05/2020 PCP: [name], MD  HPI/Recap of past 24 hours: HPI from [name] [name] is a 69 y.o. 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 Pt in to ED with EMS after laying on floor all night following a fall at home.  Pt states she is in so much pain from multiple "abscesses in her groin area" that she fell.  Pt hasnt taken any meds in a week it seems.  CBG reading high. ED Course: WBC 31k, has cellulitis under abdomen and groin area, no drainable abscesses per EDP.  Also has UTI.  Creat 2.7 which is about baseline, BUN of 130 which is not. Bicarb of 16, anion gap of 16 and BHB of 1.98. BGL 674. Pt started on insulin gtt.  Given 1L LR bolus.  Patient admitted for further management.    Today, patient continues to complain of significant pain during wound dressing changes.  Patient with significant debility, will definitely need SNF.  TOC consulted    Assessment/Plan: Principal Problem:   Cellulitis Active Problems:   Controlled type 2 diabetes mellitus with circulatory disorder, without long-term current use of insulin ([place])   Hypertension associated with diabetes ([place])   Chronic combined systolic and diastolic CHF (congestive heart failure) ([facility])   CKD stage 4 due to type 2 diabetes mellitus ([place])   Hyperglycemia   Acute lower UTI   DKA, type 2 ([place])   Sepsis ([place])   Sepsis 2/2 perineal abscesses with widespread cellulitis of the pannus, right groin region s/p I&D on 03/07/2020 On admission, noted leukocytosis, tachypnea, lactic acidosis Currently afebrile, with resolving leukocytosis BC x2 NGTD Deep surgical wound culture growing E faecalis CT abdomen/pelvis showed extensive soft tissue stranding and thickening extending across the lower anterior abdominal wall, concerning for severe cellulitis and aggressive soft tissue infection General surgery  consulted, further wound care as per general surgery S/P vancomycin, Zosyn--> switch to p.o. amoxicillin Monitor closely  DKA/DM type 2, uncontrolled A1c on 01/10/2020 was 7.2 S/p insulin drip, with improvement in CBG and closed anion gap, although still remains acidotic which could likely be from worsening kidney function/sepsis SSI, Lantus, NovoLog 3 times daily, Accu-Cheks, hypoglycemic protocol Monitor closely  Enterococcus faecalis UTI UA with moderate hemoglobin, large leukocytes, few bacteria, WBC 21-50 UC grew 60,000 E faecalis Continue amoxicillin as above  Hypertension BP stable Start PO amlodipine, continue to hold home Bystolic, hydralazine for now, may add on pending BP  CKD stage IV/acidosis Creatinine at baseline Hold home torsemide for now Strict I's and O's Monitor closely  Anemia of chronic kidney disease Acute on chronic blood loss Baseline hemoglobin 8-10 Hgb dropped to 6.6 on 03/12/20, transfused 1U of PRBC on 03/12/20  Anemia panel showed iron 30, sats 24%, ferritin 630, folate 7, vitamin B12 over 3000 Daily CBC  Chronic diastolic HF/history of ischemic cardiomyopathy History of stent placement Echo done on 05/2019 showed EF of 60 to 39%, grade 2 diastolic dysfunction Torsemide on hold Monitor closely  Obesity Lifestyle modification advised        Malnutrition Type:  Nutrition Problem: Increased nutrient needs Etiology: post-op healing   Malnutrition Characteristics:  Signs/Symptoms: estimated needs   Nutrition Interventions:  Interventions: MVI, Premier Protein, Juven    Estimated body mass index is 32.68 kg/m as calculated from the following:   Height as of this encounter: 5\' 4"  (1.626 m).   Weight  as of this encounter: 86.4 kg.     Code Status: Full  Family Communication: Discussed with patient  Disposition Plan: Status is: Inpatient  Remains inpatient appropriate because:Inpatient level of care appropriate due to severity  of illness   Dispo: The patient is from: Home              Anticipated d/c is to: SNF              Anticipated d/c date is: 2 days              Patient currently is not medically stable to d/c.    Consultants:  General surgery  Procedures:  I&D on 03/07/2020  Antimicrobials:  Amoxicillin  DVT prophylaxis: Leonardo Heparin   Objective: Vitals:   03/12/20 0511 03/12/20 0735 03/12/20 1006 03/12/20 1323  BP: 137/62 (!) 130/51 (!) 142/59 (!) 147/59  Pulse: 72 73 81 71  Resp: 18 17 16 16   Temp: 98.1 F (36.7 C) 98.5 F (36.9 C) 98.8 F (37.1 C) 98.8 F (37.1 C)  TempSrc: Oral Oral Oral Oral  SpO2: 100% 100% 100% 100%  Weight:      Height:        Intake/Output Summary (Last 24 hours) at 03/12/2020 1518 Last data filed at 03/12/2020 1324 Gross per 24 hour  Intake 915 ml  Output 2550 ml  Net -1635 ml   Filed Weights   03/06/20 2101  Weight: 86.4 kg    Exam:  General: NAD, awake, alert, oriented  Cardiovascular: S1, S2 present  Respiratory: CTAB  Abdomen: Soft, +tender, nondistended, bowel sounds present  Musculoskeletal: No bilateral pedal edema noted  Skin:  Please see picture from general surgery progress note  Psychiatry: Normal mood    Data Reviewed: CBC: Recent Labs  Lab 03/08/20 0319 03/09/20 0839 03/10/20 0317 03/11/20 0357 03/11/20 2216  WBC 27.9* 20.2* 19.3* 17.0* 13.9*  NEUTROABS 25.8* 17.6* 17.6* 14.5* 12.2*  HGB 7.4* 7.1* 7.3* 7.2* 6.6*  HCT 23.4* 22.5* 23.2* 23.7* 21.9*  MCV 90.3 90.7 90.6 92.9 93.6  PLT 198 200 180 181 621   Basic Metabolic Panel: Recent Labs  Lab 03/08/20 0319 03/09/20 0839 03/10/20 0317 03/11/20 0357 03/11/20 2216  NA 141 135 139 139 139  K 4.6 4.2 4.0 3.7 3.7  CL 109 107 109 109 108  CO2 18* 21* 21* 24 22  GLUCOSE 419* 284* 129* 132* 136*  BUN 71* 71* 63* 55* 50*  CREATININE 2.07* 2.22* 1.81* 1.80* 1.78*  CALCIUM 8.6* 8.2* 8.4* 8.2* 8.0*   [name]: Estimated Creatinine Clearance: 31.7 mL/min (A) (by  C-G formula based on SCr of 1.78 mg/dL (H)). Liver Function Tests: No results for input(s): AST, ALT, ALKPHOS, BILITOT, PROT, ALBUMIN in the last 168 hours. No results for input(s): LIPASE, AMYLASE in the last 168 hours. No results for input(s): AMMONIA in the last 168 hours. Coagulation Profile: No results for input(s): INR, PROTIME in the last 168 hours. Cardiac Enzymes: No results for input(s): CKTOTAL, CKMB, CKMBINDEX, TROPONINI in the last 168 hours. BNP (last 3 results) No results for input(s): PROBNP in the last 8760 hours. HbA1C: No results for input(s): HGBA1C in the last 72 hours. CBG: Recent Labs  Lab 03/11/20 1558 03/11/20 2030 03/12/20 0645 03/12/20 0821 03/12/20 1201  GLUCAP 231* 150* 105* 99 108*   Lipid Profile: No results for input(s): CHOL, HDL, LDLCALC, TRIG, CHOLHDL, LDLDIRECT in the last 72 hours. Thyroid Function Tests: No results for input(s): TSH, T4TOTAL, FREET4, T3FREE, THYROIDAB  in the last 72 hours. Anemia Panel: No results for input(s): VITAMINB12, FOLATE, FERRITIN, TIBC, IRON, RETICCTPCT in the last 72 hours. Urine analysis:    Component Value Date/Time   COLORURINE YELLOW 03/05/2020 2040   APPEARANCEUR CLOUDY (A) 03/05/2020 2040   LABSPEC 1.010 03/05/2020 2040   PHURINE 5.0 03/05/2020 2040   GLUCOSEU >=500 (A) 03/05/2020 2040   GLUCOSEU NEGATIVE 10/03/2013 1004   HGBUR MODERATE (A) 03/05/2020 2040   BILIRUBINUR NEGATIVE 03/05/2020 2040   KETONESUR NEGATIVE 03/05/2020 2040   PROTEINUR NEGATIVE 03/05/2020 2040   UROBILINOGEN 0.2 10/03/2013 1004   NITRITE NEGATIVE 03/05/2020 2040   LEUKOCYTESUR LARGE (A) 03/05/2020 2040   Sepsis Labs: @LABRCNTIP (procalcitonin:4,lacticidven:4)  ) Recent Results (from the past 240 hour(s))  SARS Coronavirus 2 by RT PCR (hospital order, performed in [facility] hospital lab) Nasopharyngeal Nasopharyngeal Swab     Status: None   Collection Time: 03/06/20  3:06 AM   Specimen: Nasopharyngeal Swab  Result  Value Ref Range Status   SARS Coronavirus 2 NEGATIVE NEGATIVE Final    Comment: (NOTE) SARS-CoV-2 target nucleic acids are NOT DETECTED.  The SARS-CoV-2 RNA is generally detectable in upper and lower respiratory specimens during the acute phase of infection. The lowest concentration of SARS-CoV-2 viral copies this assay can detect is 250 copies / mL. A negative result does not preclude SARS-CoV-2 infection and should not be used as the sole basis for treatment or other patient management decisions.  A negative result may occur with improper specimen collection / handling, submission of specimen other than nasopharyngeal swab, presence of viral mutation(s) within the areas targeted by this assay, and inadequate number of viral copies (<250 copies / mL). A negative result must be combined with clinical observations, patient history, and epidemiological information.  Fact Sheet for Patients:   StrictlyIdeas.no  Fact Sheet for Healthcare Providers: BankingDealers.co.za  This test is not yet approved or  cleared by the [place] FDA and has been authorized for detection and/or diagnosis of SARS-CoV-2 by FDA under an Emergency Use Authorization (EUA).  This EUA will remain in effect (meaning this test can be used) for the duration of the COVID-19 declaration under Section 564(b)(1) of the Act, 21 U.S.C. section 360bbb-3(b)(1), unless the authorization is terminated or revoked sooner.  Performed at [facility], [place] [address] [place], [place] [address]   Urine culture     Status: Abnormal   Collection Time: 03/06/20  4:50 AM   Specimen: Urine, Random  Result Value Ref Range Status   Specimen Description URINE, RANDOM  Final   Special Requests   Final    NONE Performed at [facility], [place] [address], [place], [place] [address]    Culture 60,000 COLONIES/mL ENTEROCOCCUS FAECALIS (A)  Final   Report Status 03/08/2020  FINAL  Final   Organism ID, Bacteria ENTEROCOCCUS FAECALIS (A)  Final      Susceptibility   Enterococcus faecalis - MIC*    AMPICILLIN <=2 SENSITIVE Sensitive     NITROFURANTOIN <=16 SENSITIVE Sensitive     VANCOMYCIN 1 SENSITIVE Sensitive     * 60,000 COLONIES/mL ENTEROCOCCUS FAECALIS  Culture, blood (routine x 2)     Status: None   Collection Time: 03/06/20  6:00 AM   Specimen: BLOOD RIGHT FOREARM  Result Value Ref Range Status   Specimen Description BLOOD RIGHT FOREARM  Final   Special Requests   Final    BOTTLES DRAWN AEROBIC AND ANAEROBIC Blood Culture adequate volume  Culture   Final    NO GROWTH 5 DAYS Performed at [facility], [place] [address] [place], [place] [address]    Report Status 03/11/2020 FINAL  Final  Culture, blood (routine x 2)     Status: None   Collection Time: 03/06/20  8:33 AM   Specimen: BLOOD LEFT HAND  Result Value Ref Range Status   Specimen Description BLOOD LEFT HAND  Final   Special Requests   Final    BOTTLES DRAWN AEROBIC ONLY Blood Culture results may not be optimal due to an inadequate volume of blood received in culture bottles   Culture   Final    NO GROWTH 5 DAYS Performed at [facility], [place] [address], [place], [place] [address]    Report Status 03/11/2020 FINAL  Final  MRSA PCR Screening     Status: None   Collection Time: 03/06/20 10:07 PM   Specimen: Nasal Mucosa; Nasopharyngeal  Result Value Ref Range Status   MRSA by PCR NEGATIVE NEGATIVE Final    Comment:        The GeneXpert MRSA Assay (FDA approved for NASAL specimens only), is one component of a comprehensive MRSA colonization surveillance program. It is not intended to diagnose MRSA infection nor to guide or monitor treatment for MRSA infections. Performed at [facility], [place] [address] [place], [place] [address]   Aerobic/Anaerobic Culture (surgical/deep wound)     Status: None (Preliminary result)   Collection Time: 03/07/20  4:07 PM    Specimen: PATH Other; Tissue  Result Value Ref Range Status   Specimen Description ABSCESS PERINEAL RIGHT  Final   Special Requests PT ON ANCEF ZOSYN  Final   Gram Stain   Final    FEW WBC PRESENT, PREDOMINANTLY PMN ABUNDANT GRAM NEGATIVE RODS ABUNDANT GRAM POSITIVE COCCI IN PAIRS MODERATE GRAM POSITIVE RODS    Culture   Final    FEW ENTEROCOCCUS FAECALIS HOLDING FOR POSSIBLE ANAEROBE Performed at [facility], [place] [address], [place], [place] [address]    Report Status PENDING  Incomplete   Organism ID, Bacteria ENTEROCOCCUS FAECALIS  Final      Susceptibility   Enterococcus faecalis - MIC*    AMPICILLIN <=2 SENSITIVE Sensitive     VANCOMYCIN 1 SENSITIVE Sensitive     GENTAMICIN SYNERGY SENSITIVE Sensitive     * FEW ENTEROCOCCUS FAECALIS      Studies: No results found.  Scheduled Meds: . (feeding supplement) PROSource Plus  30 mL Oral BID BM  . amoxicillin  500 mg Oral BID  . aspirin  81 mg Oral Daily  . calcium carbonate  1,250 mg Oral BID WC  . Chlorhexidine Gluconate Cloth  6 each Topical Once  . heparin  5,000 Units Subcutaneous Q8H  . insulin aspart  0-15 Units Subcutaneous TID WC  . insulin aspart  0-5 Units Subcutaneous QHS  . insulin aspart  8 Units Subcutaneous TID WC  . insulin glargine  15 Units Subcutaneous BID  . multivitamin with minerals  1 tablet Oral Daily  . pantoprazole  20 mg Oral Daily  . rosuvastatin  5 mg Oral Daily    Continuous Infusions: . lactated ringers 10 mL/hr at 03/07/20 1431     LOS: 6 days     Alma Friendly, MD [facility]  If 7PM-7AM, please contact night-coverage [email or web address] 03/12/2020, 3:18 PM

## 2020-03-12 NOTE — Progress Notes (Signed)
Nutrition Follow-up  RD working remotely.  DOCUMENTATION CODES:   Obesity unspecified  INTERVENTION:   -D/c Juven -D/c Ensure Max -30 ml Prosource Plus BID, each supplement provides 100 kcals and 15 grams protein -Continue MVI with minerals daily  NUTRITION DIAGNOSIS:   Increased nutrient needs related to post-op healing as evidenced by estimated needs.  Ongoing  GOAL:   Patient will meet greater than or equal to 90% of their needs  Progressing   MONITOR:   PO intake, Supplement acceptance, Diet advancement, Labs, Weight trends, Skin, I & O's  REASON FOR ASSESSMENT:   Malnutrition Screening Tool    ASSESSMENT:   [name] is a 69 y.o. 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  9/2- s/p Procedure: Incision drainage right perineal abscess complex  Reviewed I/O's: -950 ml x 24 hours and -1 L x 24 hours  UOP: 1.6 L x 24 hours   Attempted to speak with pt via call to hospital room phone, however, unable to reach.   Pt with improved oral intake; noted meal completion 75-10%. Pt is refusing Ensure Max and Juven supplements, stating they make her stomach hurt.   Per general surgery notes, pt approaching discharge soon. Her neighbors plan to help with dressing changes.   Medications reviewed and include calcium carbonate  Labs reviewed: CBGS: 105-150 (inpatient orders for glycemic control are 0-15 units insulin aspart TID with meals, 0-5 units insulin aspart TID with meals, 0-5 units insulin aspart daily at bedtime, 8 units insulin aspart TID with meals, and 15 units inuslin glargine BID).   Diet Order:   Diet Order            Diet heart healthy/carb modified Room service appropriate? Yes with Assist; Fluid consistency: Thin  Diet effective now                 EDUCATION NEEDS:   Education needs have been addressed  Skin:  Skin Assessment: Skin Integrity Issues: Skin Integrity Issues:: Other (Comment) Other:  non pressure wound to groin  Last BM:  03/11/20  Height:   Ht Readings from Last 1 Encounters:  03/06/20 5\' 4"  (1.626 m)    Weight:   Wt Readings from Last 1 Encounters:  03/06/20 86.4 kg    Ideal Body Weight:  54.5 kg  BMI:  Body mass index is 32.68 kg/m.  Estimated Nutritional Needs:   Kcal:  1700-1900  Protein:  90-105 grams  Fluid:  > 1.7 L    Loistine Chance, RD, LDN, [place] Registered Dietitian II Certified Diabetes Care and Education Specialist Please refer to [facility] for RD and/or RD on-call/weekend/after hours pager

## 2020-03-12 NOTE — Progress Notes (Signed)
Notified of Hgb of 6.6 from 03/11/20 (22:16) at 06: 56 on 03/12/20.  Ordered transfusion of one unit.

## 2020-03-12 NOTE — Progress Notes (Signed)
Occupational Therapy Treatment Patient Details Name: [name] MRN: [id number] DOB: 1950/10/02 Today's Date: 03/12/2020    History of present illness The pt is a 69 yo female presenting s/p I&D of perineum and pannus abcess on 9/2. She presented to the ED after a fall due to severe levels of pain from her abcesses. PMH includes: COPD, DM II, HLD< HTN, NSTEMI, renal insufficiency, and obesity.   OT comments  Patient met lying supine in bed unaware of episode of bowel incontinence. Patient with inconsistent history on recent events with patient stating she's able to tell when she needs to have a BM to patient stating that she is unaware of need for BM. Rolling R<>L in supine with supervision A requiring Max A for hygiene/clothing management. After bed level toileting task, patient declined sitting EOB or transfer to recliner. Session concluded with patient lying supine in bed with call bell within reach, bed alarm activated, and all needs met. RN aware of need to replace packing in wound on peri area. Patient would benefit from continued acute OT services in prep for d/c to next level of care with recommendation updated to SNF rehab 2/2 patient CLOF requiring Max A for toileting and LB dressing tasks, home set-up and limited assistance from family/friends.    Follow Up Recommendations  SNF    Equipment Recommendations  Other (comment) (Defer to next level of care)    Recommendations for Other Services      Precautions / Restrictions Precautions Precautions: Fall Restrictions Weight Bearing Restrictions: No       Mobility Bed Mobility Overal bed mobility: Needs Assistance Bed Mobility: Rolling Rolling: Supervision            Transfers Overall transfer level: Needs assistance                    Balance                                           ADL either performed or assessed with clinical judgement   ADL                                  Toileting - Clothing Manipulation Details (indicate cue type and reason): Maximal assist             Vision       Perception     Praxis      Cognition Arousal/Alertness: Awake/alert Behavior During Therapy: WFL for tasks assessed/performed Overall Cognitive Status: Impaired/Different from baseline Area of Impairment: Memory;Awareness                     Memory: Decreased short-term memory     Awareness: Anticipatory   General Comments: Unsure if patient understands full extent of condition.        Exercises     Shoulder Instructions       General Comments      Pertinent Vitals/ Pain       Pain Assessment: Faces Faces Pain Scale: Hurts whole lot Pain Location: buttocks/groin Pain Descriptors / Indicators: Aching;Grimacing;Guarding;Tender;Throbbing  Home Living  Prior Functioning/Environment              Frequency  Min 2X/week        Progress Toward Goals  OT Goals(current goals can now be found in the care plan section)  Progress towards OT goals: Progressing toward goals  Acute Rehab OT Goals Patient Stated Goal: To reduce pain OT Goal Formulation: With patient Time For Goal Achievement: 03/22/20 Potential to Achieve Goals: Good ADL Goals Pt Will Perform Grooming: with set-up;with supervision;standing Pt Will Perform Upper Body Bathing: with set-up;sitting Pt Will Perform Lower Body Bathing: with set-up;with supervision;with min guard assist;with min assist;sit to/from stand Pt Will Perform Upper Body Dressing: with set-up;with supervision;standing Pt Will Perform Lower Body Dressing: with supervision;with min guard assist;with min assist;sit to/from stand;with adaptive equipment Pt Will Transfer to Toilet: with set-up;with supervision;regular height toilet  Plan Discharge plan needs to be updated    Co-evaluation                 AM-PAC OT "6  Clicks" Daily Activity     Outcome Measure   Help from another person eating meals?: None Help from another person taking care of personal grooming?: A Little Help from another person toileting, which includes using toliet, bedpan, or urinal?: A Lot Help from another person bathing (including washing, rinsing, drying)?: A Lot Help from another person to put on and taking off regular upper body clothing?: A Little Help from another person to put on and taking off regular lower body clothing?: A Lot 6 Click Score: 16    End of Session    OT Visit Diagnosis: Unsteadiness on feet (R26.81);Other abnormalities of gait and mobility (R26.89);Pain   Activity Tolerance     Patient Left in bed;with call bell/phone within reach;with bed alarm set   Nurse Communication Other (comment) (Wound packing soiled 2/2 bowel incontinence)        Time: 0525-9102 OT Time Calculation (min): 23 min  Charges: OT General Charges $OT Visit: 1 Visit OT Treatments $Self Care/Home Management : 23-37 mins  Pacey Altizer H. OTR/L Supplemental OT, Department of rehab services [phone]   Blasa Raisch R H. 03/12/2020, 3:05 PM

## 2020-03-12 NOTE — Progress Notes (Addendum)
5 Days Post-Op  Subjective: Feels ok.  Still very sore with dressing changes.  Wbc down to 13K.  ROS: See above, otherwise other systems negative  Objective: Vital signs in last 24 hours: Temp:  [98.1 F (36.7 C)-98.5 F (36.9 C)] 98.5 F (36.9 C) (09/07 0735) Pulse Rate:  [72-88] 73 (09/07 0735) Resp:  [17-18] 17 (09/07 0735) BP: (126-137)/(51-70) 130/51 (09/07 0735) SpO2:  [100 %] 100 % (09/07 0735) Last BM Date: 03/10/20  Intake/Output from previous day: 09/06 0701 - 09/07 0700 In: 600 [P.O.:120; I.V.:230; IV Piggyback:250] Out: 6283 [Urine:1550] Intake/Output this shift: No intake/output data recorded.  PE: Skin: right groin, perineal wounds are all clean with no overt purulent drainage.  Packing removed and replaced to evaluate the wounds.  No erythema or induration.  Lab Results:  Recent Labs    03/11/20 0357 03/11/20 2216  WBC 17.0* 13.9*  HGB 7.2* 6.6*  HCT 23.7* 21.9*  PLT 181 184   BMET Recent Labs    03/11/20 0357 03/11/20 2216  NA 139 139  K 3.7 3.7  CL 109 108  CO2 24 22  GLUCOSE 132* 136*  BUN 55* 50*  CREATININE 1.80* 1.78*  CALCIUM 8.2* 8.0*   PT/INR No results for input(s): LABPROT, INR in the last 72 hours. CMP     Component Value Date/Time   NA 139 03/11/2020 2216   NA 138 03/29/2018 1042   K 3.7 03/11/2020 2216   CL 108 03/11/2020 2216   CO2 22 03/11/2020 2216   GLUCOSE 136 (H) 03/11/2020 2216   BUN 50 (H) 03/11/2020 2216   BUN 41 (H) 03/29/2018 1042   CREATININE 1.78 (H) 03/11/2020 2216   CREATININE 2.16 (H) 01/13/2017 1146   CALCIUM 8.0 (L) 03/11/2020 2216   PROT 7.8 01/10/2019 1018   PROT 6.6 03/29/2018 1042   ALBUMIN 4.4 01/10/2019 1018   ALBUMIN 4.2 03/29/2018 1042   AST 12 01/10/2019 1018   ALT 12 01/10/2019 1018   ALKPHOS 112 01/10/2019 1018   BILITOT 0.4 01/10/2019 1018   BILITOT 0.2 03/29/2018 1042   GFRNONAA 29 (L) 03/11/2020 2216   GFRNONAA 23 (L) 01/13/2017 1146   GFRAA 33 (L) 03/11/2020 2216   GFRAA  27 (L) 01/13/2017 1146   Lipase  No results found for: LIPASE     Studies/Results: No results found.  Anti-infectives: Anti-infectives (From admission, onward)   Start     Dose/Rate Route Frequency Ordered Stop   03/07/20 1430  ceFAZolin (ANCEF) IVPB 2g/100 mL premix        2 g 200 mL/hr over 30 Minutes Intravenous On call to O.R. 03/07/20 1415 03/07/20 1625   03/07/20 0300  cefTRIAXone (ROCEPHIN) 1 g in sodium chloride 0.9 % 100 mL IVPB  Status:  Discontinued        1 g 200 mL/hr over 30 Minutes Intravenous Every 24 hours 03/06/20 0438 03/06/20 2214   03/06/20 2245  vancomycin (VANCOCIN) IVPB 1000 mg/200 mL premix        1,000 mg 200 mL/hr over 60 Minutes Intravenous Every 24 hours 03/06/20 2233     03/06/20 2230  piperacillin-tazobactam (ZOSYN) IVPB 3.375 g        3.375 g 12.5 mL/hr over 240 Minutes Intravenous Every 8 hours 03/06/20 2226     03/06/20 2215  piperacillin-tazobactam (ZOSYN) IVPB 4.5 g  Status:  Discontinued        4.5 g 200 mL/hr over 30 Minutes Intravenous Every 8 hours 03/06/20 2214 03/06/20  2224   03/06/20 0215  cefTRIAXone (ROCEPHIN) 1 g in sodium chloride 0.9 % 100 mL IVPB        1 g 200 mL/hr over 30 Minutes Intravenous  Once 03/06/20 0214 03/06/20 0336       Assessment/Plan DM CKD4 HTN ICM Obesity BMI 32.68 UTI Anemia - getting blood today  Right perineal abscess complex multiloculated S/pIncision drainage right perineal abscess complex9/2 Dr. Brantley Stage -POD #5 - culture ENTEROCOCCUS FAECALIS, pan sensitive.  Can likely convert to oral abx therapy. - WBCdownto 13K today - Wounds are cleaning up nicely.  Cont dressing changes.  She says she is going to do her dressing changes as well as her neighbors.  I asked if her neighbors could come up to watch the dressing change so they could learn and she said yes but they didn't even know she was in the hospital yet.  I asked her to call them today as her wounds are clean and I suspect she is  nearing discharge readiness within the next day or so.  -will order [facility] RN if able to obtain as well  ID -zosyn 9/1>>, vancomcyin 9/1>> FEN -HH/CM diet VTE -SCDs, sq heparin Foley -in place Follow up -DOW vs wound clinic    LOS: 6 days    Henreitta Cea , [facility] Surgery 03/12/2020, 9:26 AM Please see Amion for pager number during day hours 7:00am-4:30pm or 7:00am -11:30am on weekends

## 2020-03-13 ENCOUNTER — Telehealth: Payer: Self-pay

## 2020-03-13 DIAGNOSIS — E1159 Type 2 diabetes mellitus with other circulatory complications: Secondary | ICD-10-CM

## 2020-03-13 LAB — BPAM RBC
Blood Product Expiration Date: 202109192359
ISSUE DATE / TIME: 202109070950
Unit Type and Rh: 7300

## 2020-03-13 LAB — GLUCOSE, CAPILLARY
Glucose-Capillary: 118 mg/dL — ABNORMAL HIGH (ref 70–99)
Glucose-Capillary: 61 mg/dL — ABNORMAL LOW (ref 70–99)
Glucose-Capillary: 125 mg/dL — ABNORMAL HIGH (ref 70–99)
Glucose-Capillary: 167 mg/dL — ABNORMAL HIGH (ref 70–99)
Glucose-Capillary: 139 mg/dL — ABNORMAL HIGH (ref 70–99)

## 2020-03-13 LAB — TYPE AND SCREEN
ABO/RH(D): B POS
Antibody Screen: NEGATIVE
Unit division: 0

## 2020-03-13 LAB — CBC WITH DIFFERENTIAL/PLATELET
Abs Immature Granulocytes: 0.07 10*3/uL (ref 0.00–0.07)
Basophils Absolute: 0 10*3/uL (ref 0.0–0.1)
Basophils Relative: 0 %
Eosinophils Absolute: 0 10*3/uL (ref 0.0–0.5)
Eosinophils Relative: 0 %
HCT: 27.8 % — ABNORMAL LOW (ref 36.0–46.0)
Hemoglobin: 8.7 g/dL — ABNORMAL LOW (ref 12.0–15.0)
Immature Granulocytes: 1 %
Lymphocytes Relative: 7 %
Lymphs Abs: 1 10*3/uL (ref 0.7–4.0)
MCH: 28.8 pg (ref 26.0–34.0)
MCHC: 31.3 g/dL (ref 30.0–36.0)
MCV: 92.1 fL (ref 80.0–100.0)
Monocytes Absolute: 1.2 10*3/uL — ABNORMAL HIGH (ref 0.1–1.0)
Monocytes Relative: 9 %
Neutro Abs: 11 10*3/uL — ABNORMAL HIGH (ref 1.7–7.7)
Neutrophils Relative %: 83 %
Platelets: 242 10*3/uL (ref 150–400)
RBC: 3.02 MIL/uL — ABNORMAL LOW (ref 3.87–5.11)
RDW: 15.1 % (ref 11.5–15.5)
WBC: 13.3 10*3/uL — ABNORMAL HIGH (ref 4.0–10.5)
nRBC: 0 % (ref 0.0–0.2)

## 2020-03-13 LAB — BASIC METABOLIC PANEL
Anion gap: 10 (ref 5–15)
BUN: 25 mg/dL — ABNORMAL HIGH (ref 8–23)
CO2: 21 mmol/L — ABNORMAL LOW (ref 22–32)
Calcium: 8 mg/dL — ABNORMAL LOW (ref 8.9–10.3)
Chloride: 106 mmol/L (ref 98–111)
Creatinine, Ser: 1.54 mg/dL — ABNORMAL HIGH (ref 0.44–1.00)
GFR calc Af Amer: 39 mL/min — ABNORMAL LOW (ref >60)
GFR calc non Af Amer: 34 mL/min — ABNORMAL LOW (ref >60)
Glucose, Bld: 192 mg/dL — ABNORMAL HIGH (ref 70–99)
Potassium: 3.6 mmol/L (ref 3.5–5.1)
Sodium: 137 mmol/L (ref 135–145)

## 2020-03-13 LAB — AEROBIC/ANAEROBIC CULTURE W GRAM STAIN (SURGICAL/DEEP WOUND)

## 2020-03-13 LAB — SARS CORONAVIRUS 2 (TAT 6-24 HRS): SARS Coronavirus 2: NEGATIVE

## 2020-03-13 MED ORDER — RISAQUAD PO CAPS
1.0000 | ORAL_CAPSULE | Freq: Every day | ORAL | Status: DC
  Administered 2020-03-13 – 2020-03-14 (×2): 1 via ORAL
  Filled 2020-03-13 (×2): qty 1

## 2020-03-13 MED ORDER — AMOXICILLIN 500 MG PO CAPS
500.0000 mg | ORAL_CAPSULE | Freq: Three times a day (TID) | ORAL | Status: DC
  Administered 2020-03-13 – 2020-03-14 (×2): 500 mg via ORAL
  Filled 2020-03-13 (×4): qty 1

## 2020-03-13 MED ORDER — TORSEMIDE 20 MG PO TABS
20.0000 mg | ORAL_TABLET | Freq: Every day | ORAL | Status: DC
  Administered 2020-03-13 – 2020-03-14 (×2): 20 mg via ORAL
  Filled 2020-03-13 (×2): qty 1

## 2020-03-13 NOTE — Care Management Important Message (Signed)
Important Message  Patient Details  Name: [name] MRN: [id number] Date of Birth: 15-Apr-1951   Medicare Important Message Given:  Yes - Important Message mailed due to current National Emergency  Verbal consent obtained due to current National Emergency  Relationship to patient: Self Contact Name: [name] Call Date: 03/13/20  Time: 1250 Phone: [phone] Outcome: Spoke with contact Important Message mailed to: Patient address on file    Delorse Lek 03/13/2020, 12:51 PM

## 2020-03-13 NOTE — TOC Progression Note (Signed)
Transition of Care [facility]) - Progression Note    Patient Details  Name: [name] MRN: [id number] Date of Birth: [birth year]  Transition of Care [facility]) CM/SW Contact  Sharin Mons, RN Phone Number: 03/13/2020, 11:05 AM  Clinical Narrative:    Pt accepted SNF bed extended by [facility] admissions. Bed will bed available on tomorrow, 03/14/2020,MD and nurse made aware. Updated COVID needed. Insurance auth.faxed out.  TOC team will continue to monitor for needs....  Expected Discharge Plan: Skilled Nursing Facility Barriers to Discharge: Ship broker, No SNF bed  Expected Discharge Plan and Services Expected Discharge Plan: [employer] In-house Referral: [facility] Discharge Planning Services: CM Consult Post Acute Care Choice: [place] Living arrangements for the past 2 months: Apartment                                       Social Determinants of Health (SDOH) Interventions    Readmission Risk Interventions No flowsheet data found.

## 2020-03-13 NOTE — Progress Notes (Signed)
PHARMACY NOTE:  ANTIMICROBIAL RENAL DOSAGE ADJUSTMENT  Current antimicrobial regimen includes a mismatch between antimicrobial dosage and estimated renal function.  As per policy approved by the Pharmacy & Therapeutics and Medical Executive Committees, the antimicrobial dosage will be adjusted accordingly.  Current antimicrobial dosage:  Amoxicillin 500 mg PO Q 12 hrs  Indication: UTI, perineal abscess  Renal Function:  Estimated Creatinine Clearance: 36.7 mL/min (A) (by C-G formula based on SCr of 1.54 mg/dL (H)). []      On intermittent HD, scheduled: []      On CRRT    Antimicrobial dosage has been changed to:  Amoxicillin 500 mg PO Q 8 hrs  Thank you for allowing pharmacy to be a part of this patient's care.  Gillermina Hu, PharmD, BCPS, [facility] Clinical Pharmacist 03/13/2020 7:25 PM

## 2020-03-13 NOTE — Plan of Care (Signed)

## 2020-03-13 NOTE — Progress Notes (Signed)
6 Days Post-Op  Subjective: No new complaints.  Dressing changes improving.  Thought she was going home.  Didn't know the plan was for SNF.  ROS: See above, otherwise other systems negative  Objective: Vital signs in last 24 hours: Temp:  [98.1 F (36.7 C)-98.8 F (37.1 C)] 98.6 F (37 C) (09/08 0532) Pulse Rate:  [68-81] 71 (09/08 0532) Resp:  [15-17] 16 (09/08 0532) BP: (126-150)/(51-59) 126/52 (09/08 0532) SpO2:  [98 %-100 %] 100 % (09/08 0532) Last BM Date: 03/12/20  Intake/Output from previous day: 09/07 0701 - 09/08 0700 In: 315 [Blood:315] Out: 1800 [Urine:1800] Intake/Output this shift: No intake/output data recorded.  PE: Skin: all wounds are healing well and are all clean.  Lab Results:  Recent Labs    03/11/20 0357 03/11/20 0357 03/11/20 2216 03/12/20 1530  WBC 17.0*  --  13.9*  --   HGB 7.2*   < > 6.6* 8.9*  HCT 23.7*   < > 21.9* 27.9*  PLT 181  --  184  --    < > = values in this interval not displayed.   BMET Recent Labs    03/11/20 0357 03/11/20 2216  NA 139 139  K 3.7 3.7  CL 109 108  CO2 24 22  GLUCOSE 132* 136*  BUN 55* 50*  CREATININE 1.80* 1.78*  CALCIUM 8.2* 8.0*   PT/INR No results for input(s): LABPROT, INR in the last 72 hours. CMP     Component Value Date/Time   NA 139 03/11/2020 2216   NA 138 03/29/2018 1042   K 3.7 03/11/2020 2216   CL 108 03/11/2020 2216   CO2 22 03/11/2020 2216   GLUCOSE 136 (H) 03/11/2020 2216   BUN 50 (H) 03/11/2020 2216   BUN 41 (H) 03/29/2018 1042   CREATININE 1.78 (H) 03/11/2020 2216   CREATININE 2.16 (H) 01/13/2017 1146   CALCIUM 8.0 (L) 03/11/2020 2216   PROT 7.8 01/10/2019 1018   PROT 6.6 03/29/2018 1042   ALBUMIN 4.4 01/10/2019 1018   ALBUMIN 4.2 03/29/2018 1042   AST 12 01/10/2019 1018   ALT 12 01/10/2019 1018   ALKPHOS 112 01/10/2019 1018   BILITOT 0.4 01/10/2019 1018   BILITOT 0.2 03/29/2018 1042   GFRNONAA 29 (L) 03/11/2020 2216   GFRNONAA 23 (L) 01/13/2017 1146   GFRAA  33 (L) 03/11/2020 2216   GFRAA 27 (L) 01/13/2017 1146   Lipase  No results found for: LIPASE     Studies/Results: No results found.  Anti-infectives: Anti-infectives (From admission, onward)   Start     Dose/Rate Route Frequency Ordered Stop   03/12/20 1500  amoxicillin (AMOXIL) capsule 500 mg        500 mg Oral 2 times daily 03/12/20 1450     03/07/20 1430  ceFAZolin (ANCEF) IVPB 2g/100 mL premix        2 g 200 mL/hr over 30 Minutes Intravenous On call to O.R. 03/07/20 1415 03/07/20 1625   03/07/20 0300  cefTRIAXone (ROCEPHIN) 1 g in sodium chloride 0.9 % 100 mL IVPB  Status:  Discontinued        1 g 200 mL/hr over 30 Minutes Intravenous Every 24 hours 03/06/20 0438 03/06/20 2214   03/06/20 2245  vancomycin (VANCOCIN) IVPB 1000 mg/200 mL premix  Status:  Discontinued        1,000 mg 200 mL/hr over 60 Minutes Intravenous Every 24 hours 03/06/20 2233 03/12/20 1450   03/06/20 2230  piperacillin-tazobactam (ZOSYN) IVPB 3.375 g  Status:  Discontinued        3.375 g 12.5 mL/hr over 240 Minutes Intravenous Every 8 hours 03/06/20 2226 03/12/20 1450   03/06/20 2215  piperacillin-tazobactam (ZOSYN) IVPB 4.5 g  Status:  Discontinued        4.5 g 200 mL/hr over 30 Minutes Intravenous Every 8 hours 03/06/20 2214 03/06/20 2224   03/06/20 0215  cefTRIAXone (ROCEPHIN) 1 g in sodium chloride 0.9 % 100 mL IVPB        1 g 200 mL/hr over 30 Minutes Intravenous  Once 03/06/20 0214 03/06/20 0336       Assessment/Plan DM CKD4 HTN ICM Obesity BMI 32.68 UTI Anemia - getting blood today  Right perineal abscess complex multiloculated S/pIncision drainage right perineal abscess complex9/2 Dr. Brantley Stage -POD #6 - culture ENTEROCOCCUS FAECALIS,pan sensitive.  transitioned to amoxicillin yesterday - WBCdownto 13K yesterday - Wounds are cleaning up nicely.  Cont dressing changes.  -DC foley today -surgically stable for DC to wherever the plan is for her to go.  I wrote for [facility] yesterday  and she said she had some neighbors who could help her out.  Therapies are recommending SNF.  Either way, her wounds are clean and improving.  Stable for DC.  ID -zosyn 9/1>>, vancomcyin 9/1>> 9/7, amoxicillin 9/7 --> FEN -HH/CM diet VTE -SCDs, sq heparin Foley -in place Follow up - wound clinic, referral made   LOS: 7 days    Henreitta Cea , [facility] Surgery 03/13/2020, 7:32 AM Please see Amion for pager number during day hours 7:00am-4:30pm or 7:00am -11:30am on weekends

## 2020-03-13 NOTE — Progress Notes (Signed)
Progress Note    [name]  QQI:[id number] DOB: 02-Jan-1951  DOA: 03/05/2020 PCP: [name], MD    Brief Narrative:     Medical records reviewed and are as summarized below:  [name] is an 69 y.o. female from home who was found to have Right perineal abscess complex multiloculated and is S/pIncision drainage right perineal abscess complex9/2.  Assessment/Plan:   Principal Problem:   Cellulitis Active Problems:   Controlled type 2 diabetes mellitus with circulatory disorder, without long-term current use of insulin (HCC)   Hypertension associated with diabetes ([place])   Chronic combined systolic and diastolic CHF (congestive heart failure) ([place])   CKD stage 4 due to type 2 diabetes mellitus ([place])   Hyperglycemia   Acute lower UTI   DKA, type 2 (HCC)   Sepsis ([place])   Sepsis (POA) due to right perineal abscess due to E faecalis -Status post I&D on 9/2 -General surgery consultation  -Patient is status post Vanco and Zosyn and has been changed to p.o. amoxicillin -Patient is medically stable for discharge and has been for 48 hours  Type 2 diabetes uncontrolled with hyperglycemia -A1c was 7.2 -Sliding scale insulin, Lantus, NovoLog with meals  E faecalis UTI -Continue with amoxicillin as notated above  Anemia of chronic kidney disease -Status post 1 unit of PRBCs on 9/7 -Continue to monitor  Chronic kidney disease stage IV -Monitor kidney function  -Unfortunately patient has not had labs for 2 days but creatinine appears stable during the prior labs  Hypertension -Continue amlodipine  Morbid obesity Body mass index is 32.68 kg/m.  Nutrition Status: Nutrition Problem: Increased nutrient needs Etiology: post-op healing Signs/Symptoms: estimated needs Interventions: MVI, Premier Protein, Juven    Family Communication/Anticipated D/C date and plan/Code Status   DVT prophylaxis: Heparin Code Status: Full Code.   Disposition Plan: Status is: Inpatient  Remains inpatient appropriate because:Unsafe d/c plan   Dispo: The patient is from: Home              Anticipated d/c is to: SNF              Anticipated d/c date is: 1 day              Patient currently is medically stable to d/c.         Medical Consultants:    General surgery  Subjective:   Patient agreeable to go to short-term skilled nursing facility for wound care and therapy  Objective:    Vitals:   03/12/20 1323 03/12/20 2013 03/13/20 0532 03/13/20 0841  BP: (!) 147/59 (!) 150/51 (!) 126/52 (!) 126/54  Pulse: 71 68 71 86  Resp: 16 15 16 18   Temp: 98.8 F (37.1 C) 98.1 F (36.7 C) 98.6 F (37 C) 98.4 F (36.9 C)  TempSrc: Oral Oral Oral Oral  SpO2: 100% 98% 100% 100%  Weight:      Height:        Intake/Output Summary (Last 24 hours) at 03/13/2020 9417 Last data filed at 03/13/2020 0500 Gross per 24 hour  Intake 315 ml  Output 1800 ml  Net -1485 ml   Filed Weights   03/06/20 2101  Weight: 86.4 kg    Exam:  General: Appearance:    Obese female in no acute distress     Lungs:     Clear to auscultation bilaterally, respirations unlabored  Heart:    Normal heart rate. Normal rhythm. No murmurs, rubs, or gallops.  MS:   All extremities are intact.   Neurologic:   Awake, alert, oriented x 3. No apparent focal neurological           defect.     Data Reviewed:   I have personally reviewed following labs and imaging studies:  Labs: Labs show the following:   Basic Metabolic Panel: Recent Labs  Lab 03/08/20 0319 03/08/20 0319 03/09/20 0839 03/09/20 0839 03/10/20 0317 03/10/20 0317 03/11/20 0357 03/11/20 2216  NA 141  --  135  --  139  --  139 139  K 4.6   < > 4.2   < > 4.0   < > 3.7 3.7  CL 109  --  107  --  109  --  109 108  CO2 18*  --  21*  --  21*  --  24 22  GLUCOSE 419*  --  284*  --  129*  --  132* 136*  BUN 71*  --  71*  --  63*  --  55* 50*  CREATININE 2.07*  --  2.22*  --  1.81*  --   1.80* 1.78*  CALCIUM 8.6*  --  8.2*  --  8.4*  --  8.2* 8.0*   < > = values in this interval not displayed.   [name] Estimated Creatinine Clearance: 31.7 mL/min (A) (by C-G formula based on SCr of 1.78 mg/dL (H)). Liver Function Tests: No results for input(s): AST, ALT, ALKPHOS, BILITOT, PROT, ALBUMIN in the last 168 hours. No results for input(s): LIPASE, AMYLASE in the last 168 hours. No results for input(s): AMMONIA in the last 168 hours. Coagulation profile No results for input(s): INR, PROTIME in the last 168 hours.  CBC: Recent Labs  Lab 03/08/20 0319 03/08/20 0319 03/09/20 0839 03/10/20 0317 03/11/20 0357 03/11/20 2216 03/12/20 1530  WBC 27.9*  --  20.2* 19.3* 17.0* 13.9*  --   NEUTROABS 25.8*  --  17.6* 17.6* 14.5* 12.2*  --   HGB 7.4*   < > 7.1* 7.3* 7.2* 6.6* 8.9*  HCT 23.4*   < > 22.5* 23.2* 23.7* 21.9* 27.9*  MCV 90.3  --  90.7 90.6 92.9 93.6  --   PLT 198  --  200 180 181 184  --    < > = values in this interval not displayed.   Cardiac Enzymes: No results for input(s): CKTOTAL, CKMB, CKMBINDEX, TROPONINI in the last 168 hours. BNP (last 3 results) No results for input(s): PROBNP in the last 8760 hours. CBG: Recent Labs  Lab 03/12/20 1201 03/12/20 1538 03/12/20 2043 03/13/20 0642 03/13/20 0726  GLUCAP 108* 156* 143* 61* 118*   D-Dimer: No results for input(s): DDIMER in the last 72 hours. Hgb A1c: No results for input(s): HGBA1C in the last 72 hours. Lipid Profile: No results for input(s): CHOL, HDL, LDLCALC, TRIG, CHOLHDL, LDLDIRECT in the last 72 hours. Thyroid function studies: No results for input(s): TSH, T4TOTAL, T3FREE, THYROIDAB in the last 72 hours.  Invalid input(s): FREET3 Anemia work up: No results for input(s): VITAMINB12, FOLATE, FERRITIN, TIBC, IRON, RETICCTPCT in the last 72 hours. Sepsis Labs: Recent Labs  Lab 03/07/20 0059 03/07/20 0414 03/08/20 0319 03/09/20 0839 03/10/20 0317 03/11/20 0357 03/11/20 2216  WBC  --  27.8*    < > 20.2* 19.3* 17.0* 13.9*  LATICACIDVEN 2.0* 1.7  --   --   --   --   --    < > = values in this interval not displayed.  Microbiology Recent Results (from the past 240 hour(s))  SARS Coronavirus 2 by RT PCR (hospital order, performed in [facility] hospital lab) Nasopharyngeal Nasopharyngeal Swab     Status: None   Collection Time: 03/06/20  3:06 AM   Specimen: Nasopharyngeal Swab  Result Value Ref Range Status   SARS Coronavirus 2 NEGATIVE NEGATIVE Final    Comment: (NOTE) SARS-CoV-2 target nucleic acids are NOT DETECTED.  The SARS-CoV-2 RNA is generally detectable in upper and lower respiratory specimens during the acute phase of infection. The lowest concentration of SARS-CoV-2 viral copies this assay can detect is 250 copies / mL. A negative result does not preclude SARS-CoV-2 infection and should not be used as the sole basis for treatment or other patient management decisions.  A negative result may occur with improper specimen collection / handling, submission of specimen other than nasopharyngeal swab, presence of viral mutation(s) within the areas targeted by this assay, and inadequate number of viral copies (<250 copies / mL). A negative result must be combined with clinical observations, patient history, and epidemiological information.  Fact Sheet for Patients:   StrictlyIdeas.no  Fact Sheet for Healthcare Providers: BankingDealers.co.za  This test is not yet approved or  cleared by the [place] FDA and has been authorized for detection and/or diagnosis of SARS-CoV-2 by FDA under an Emergency Use Authorization (EUA).  This EUA will remain in effect (meaning this test can be used) for the duration of the COVID-19 declaration under Section 564(b)(1) of the Act, 21 U.S.C. section 360bbb-3(b)(1), unless the authorization is terminated or revoked sooner.  Performed at [facility], [place] [address]  [place], [place] [address]   Urine culture     Status: Abnormal   Collection Time: 03/06/20  4:50 AM   Specimen: Urine, Random  Result Value Ref Range Status   Specimen Description URINE, RANDOM  Final   Special Requests   Final    NONE Performed at [facility], [place] [address], [place], [place] [address]    Culture 60,000 COLONIES/mL ENTEROCOCCUS FAECALIS (A)  Final   Report Status 03/08/2020 FINAL  Final   Organism ID, Bacteria ENTEROCOCCUS FAECALIS (A)  Final      Susceptibility   Enterococcus faecalis - MIC*    AMPICILLIN <=2 SENSITIVE Sensitive     NITROFURANTOIN <=16 SENSITIVE Sensitive     VANCOMYCIN 1 SENSITIVE Sensitive     * 60,000 COLONIES/mL ENTEROCOCCUS FAECALIS  Culture, blood (routine x 2)     Status: None   Collection Time: 03/06/20  6:00 AM   Specimen: BLOOD RIGHT FOREARM  Result Value Ref Range Status   Specimen Description BLOOD RIGHT FOREARM  Final   Special Requests   Final    BOTTLES DRAWN AEROBIC AND ANAEROBIC Blood Culture adequate volume   Culture   Final    NO GROWTH 5 DAYS Performed at [facility], [place] [address] [place], [place] [address]    Report Status 03/11/2020 FINAL  Final  Culture, blood (routine x 2)     Status: None   Collection Time: 03/06/20  8:33 AM   Specimen: BLOOD LEFT HAND  Result Value Ref Range Status   Specimen Description BLOOD LEFT HAND  Final   Special Requests   Final    BOTTLES DRAWN AEROBIC ONLY Blood Culture results may not be optimal due to an inadequate volume of blood received in culture bottles   Culture   Final    NO GROWTH 5 DAYS  Performed at [facility], [place] [address], [place], [place] [address]    Report Status 03/11/2020 FINAL  Final  MRSA PCR Screening     Status: None   Collection Time: 03/06/20 10:07 PM   Specimen: Nasal Mucosa; Nasopharyngeal  Result Value Ref Range Status   MRSA by PCR NEGATIVE NEGATIVE Final    Comment:        The GeneXpert MRSA Assay (FDA approved for NASAL  specimens only), is one component of a comprehensive MRSA colonization surveillance program. It is not intended to diagnose MRSA infection nor to guide or monitor treatment for MRSA infections. Performed at [facility], [place] [address], [place], [place] [address]   Aerobic/Anaerobic Culture (surgical/deep wound)     Status: None (Preliminary result)   Collection Time: 03/07/20  4:07 PM   Specimen: PATH Other; Tissue  Result Value Ref Range Status   Specimen Description ABSCESS PERINEAL RIGHT  Final   Special Requests PT ON ANCEF ZOSYN  Final   Gram Stain   Final    FEW WBC PRESENT, PREDOMINANTLY PMN ABUNDANT GRAM NEGATIVE RODS ABUNDANT GRAM POSITIVE COCCI IN PAIRS MODERATE GRAM POSITIVE RODS    Culture   Final    FEW ENTEROCOCCUS FAECALIS HOLDING FOR POSSIBLE ANAEROBE Performed at [facility], [place] [address] [place], [place] [address]    Report Status PENDING  Incomplete   Organism ID, Bacteria ENTEROCOCCUS FAECALIS  Final      Susceptibility   Enterococcus faecalis - MIC*    AMPICILLIN <=2 SENSITIVE Sensitive     VANCOMYCIN 1 SENSITIVE Sensitive     GENTAMICIN SYNERGY SENSITIVE Sensitive     * FEW ENTEROCOCCUS FAECALIS    Procedures and diagnostic studies:  No results found.  Medications:   . (feeding supplement) PROSource Plus  30 mL Oral BID BM  . acidophilus  1 capsule Oral Daily  . amLODipine  5 mg Oral Daily  . amoxicillin  500 mg Oral BID  . aspirin  81 mg Oral Daily  . calcium carbonate  1,250 mg Oral BID WC  . Chlorhexidine Gluconate Cloth  6 each Topical Once  . heparin  5,000 Units Subcutaneous Q8H  . insulin aspart  0-15 Units Subcutaneous TID WC  . insulin aspart  0-5 Units Subcutaneous QHS  . insulin aspart  8 Units Subcutaneous TID WC  . insulin glargine  15 Units Subcutaneous BID  . multivitamin with minerals  1 tablet Oral Daily  . pantoprazole  20 mg Oral Daily  . rosuvastatin  5 mg Oral Daily   Continuous Infusions: .  lactated ringers 10 mL/hr at 03/07/20 1431     LOS: 7 days   Geradine Girt  [facility]   How to contact the [facility] Attending or Consulting provider [place] or covering provider during after hours [place], for this patient?  1. Check the care team in [facility] and look for a) attending/consulting [facility] provider listed and b) the [facility] team listed 2. Log into [email or web address] and use [place]'s universal password to access. If you do not have the password, please contact the hospital operator. 3. Locate the [facility] provider you are looking for under [facility] and page to a number that you can be directly reached. 4. If you still have difficulty reaching the provider, please page the [facility] (Director on Call) for the Hospitalists listed on amion for assistance.  03/13/2020, 9:27 AM

## 2020-03-13 NOTE — Progress Notes (Signed)
Physical Therapy Treatment Patient Details Name: [name] MRN: [id number] DOB: 1950-09-01 Today's Date: 03/13/2020    History of Present Illness The pt is a 69 yo female presenting s/p I&D of perineum and pannus abcess on 9/2. She presented to the ED after a fall due to severe levels of pain from her abcesses. PMH includes: COPD, DM II, HLD< HTN, NSTEMI, renal insufficiency, and obesity.    PT Comments    Pt received with bowel incontinence. Assisted to bathroom for toileting, peri care and gown change. Pt requiring min guard assist for functional mobility. Displays decreased endurance, balance, and pain (buttocks). Given deficits and decreased caregiver support, continue to recommend SNF for ongoing Physical Therapy.      Follow Up Recommendations  SNF;Supervision/Assistance - 24 hour     Equipment Recommendations  Rolling walker with 5" wheels    Recommendations for Other Services       Precautions / Restrictions Precautions Precautions: Fall;Other (comment) Precaution Comments: R buttocks wounds Restrictions Weight Bearing Restrictions: No    Mobility  Bed Mobility Overal bed mobility: Needs Assistance Bed Mobility: Supine to Sit;Sit to Supine     Supine to sit: Supervision Sit to supine: Min guard      Transfers Overall transfer level: Needs assistance Equipment used: Rolling walker (2 wheeled) Transfers: Sit to/from Stand Sit to Stand: Min guard            Ambulation/Gait Ambulation/Gait assistance: Min guard Gait Distance (Feet): 25 Feet Assistive device: Rolling walker (2 wheeled) Gait Pattern/deviations: Step-through pattern;Decreased stride length;Trunk flexed Gait velocity: decreased   General Gait Details: Cues for walker proximity, negotiating unlevel thresholds, min guard for safety   Stairs             Wheelchair Mobility    Modified Rankin (Stroke Patients Only)       Balance Overall balance assessment: Needs  assistance Sitting-balance support: Feet supported Sitting balance-Leahy Scale: Good     Standing balance support: Bilateral upper extremity supported Standing balance-Leahy Scale: Poor                              Cognition Arousal/Alertness: Awake/alert Behavior During Therapy: WFL for tasks assessed/performed Overall Cognitive Status: Impaired/Different from baseline Area of Impairment: Memory                     Memory: Decreased short-term memory                Exercises      General Comments        Pertinent Vitals/Pain Pain Assessment: Faces Faces Pain Scale: Hurts even more Pain Location: buttocks Pain Descriptors / Indicators: Aching;Grimacing;Guarding;Tender;Throbbing Pain Intervention(s): Limited activity within patient's tolerance;Monitored during session    Home Living                      Prior Function            PT Goals (current goals can now be found in the care plan section) Acute Rehab PT Goals Patient Stated Goal: To reduce pain Potential to Achieve Goals: Good Progress towards PT goals: Progressing toward goals    Frequency    Min 2X/week      PT Plan Discharge plan needs to be updated    Co-evaluation              AM-PAC PT "6 Clicks" Mobility   Outcome  Measure  Help needed turning from your back to your side while in a flat bed without using bedrails?: None Help needed moving from lying on your back to sitting on the side of a flat bed without using bedrails?: A Little Help needed moving to and from a bed to a chair (including a wheelchair)?: A Little Help needed standing up from a chair using your arms (e.g., wheelchair or bedside chair)?: A Little Help needed to walk in hospital room?: A Little Help needed climbing 3-5 steps with a railing? : A Lot 6 Click Score: 18    End of Session   Activity Tolerance: Patient tolerated treatment well Patient left: in bed;with call bell/phone  within reach;with bed alarm set Nurse Communication: Mobility status;Other (comment) (right buttocks wound open (no dressing)) PT Visit Diagnosis: Difficulty in walking, not elsewhere classified (R26.2);Pain     Time: 1388-7195 PT Time Calculation (min) (ACUTE ONLY): 23 min  Charges:  $Therapeutic Activity: 23-37 mins                       Wyona Almas, PT, DPT Acute Rehabilitation Services Pager [phone] Office [phone]    Deno Etienne 03/13/2020, 3:26 PM

## 2020-03-13 NOTE — Telephone Encounter (Signed)
LVM for pt to RTN my call to schedule AWV-I with [facility]

## 2020-03-13 NOTE — Discharge Instructions (Signed)
MIDLINE WOUND CARE: °- midline dressing to be changed twice daily °- supplies: sterile saline, kerlix, scissors, ABD pads, tape  °- remove dressing and all packing carefully, moistening with sterile saline as needed to avoid packing/internal dressing sticking to the wound. °- clean edges of skin around the wound with water/gauze, making sure there is no tape debris or leakage left on skin that could cause skin irritation or breakdown. °- dampen and clean kerlix with sterile saline and pack wound from wound base to skin level, making sure to take note of any possible areas of wound tracking, tunneling and packing appropriately. Wound can be packed loosely. Trim kerlix to size if a whole kerlix is not required. °- cover wound with a dry ABD pad and secure with tape.  °- write the date/time on the dry dressing/tape to better track when the last dressing change occurred. °- apply any skin protectant/powder recommended by clinician to protect skin/skin folds. °- change dressing as needed if leakage occurs, wound gets contaminated, or patient requests to shower. °- patient may shower daily with wound open and following the shower the wound should be dried and a clean dressing placed.  ° °

## 2020-03-13 NOTE — Progress Notes (Signed)
CBG: 61   Patient given 191mL of orange juice will recheck in 15 mins

## 2020-03-13 NOTE — Telephone Encounter (Signed)
Patient returning your call, she is currently admitted to the hospital and does not have a release date. After the hospital she is going into rehab, so may need to hold off on scheduling for a bit.

## 2020-03-14 DIAGNOSIS — R7989 Other specified abnormal findings of blood chemistry: Secondary | ICD-10-CM | POA: Diagnosis not present

## 2020-03-14 DIAGNOSIS — A419 Sepsis, unspecified organism: Secondary | ICD-10-CM | POA: Diagnosis not present

## 2020-03-14 DIAGNOSIS — I509 Heart failure, unspecified: Secondary | ICD-10-CM | POA: Diagnosis not present

## 2020-03-14 DIAGNOSIS — R6520 Severe sepsis without septic shock: Secondary | ICD-10-CM | POA: Diagnosis not present

## 2020-03-14 DIAGNOSIS — E1159 Type 2 diabetes mellitus with other circulatory complications: Secondary | ICD-10-CM | POA: Diagnosis not present

## 2020-03-14 DIAGNOSIS — Z20822 Contact with and (suspected) exposure to covid-19: Secondary | ICD-10-CM | POA: Diagnosis not present

## 2020-03-14 DIAGNOSIS — L02214 Cutaneous abscess of groin: Secondary | ICD-10-CM | POA: Diagnosis not present

## 2020-03-14 DIAGNOSIS — E1122 Type 2 diabetes mellitus with diabetic chronic kidney disease: Secondary | ICD-10-CM | POA: Diagnosis not present

## 2020-03-14 DIAGNOSIS — E119 Type 2 diabetes mellitus without complications: Secondary | ICD-10-CM | POA: Diagnosis not present

## 2020-03-14 DIAGNOSIS — M2550 Pain in unspecified joint: Secondary | ICD-10-CM | POA: Diagnosis not present

## 2020-03-14 DIAGNOSIS — E118 Type 2 diabetes mellitus with unspecified complications: Secondary | ICD-10-CM | POA: Diagnosis not present

## 2020-03-14 DIAGNOSIS — Z7401 Bed confinement status: Secondary | ICD-10-CM | POA: Diagnosis not present

## 2020-03-14 DIAGNOSIS — R6889 Other general symptoms and signs: Secondary | ICD-10-CM | POA: Diagnosis not present

## 2020-03-14 DIAGNOSIS — M6281 Muscle weakness (generalized): Secondary | ICD-10-CM | POA: Diagnosis not present

## 2020-03-14 DIAGNOSIS — R7981 Abnormal blood-gas level: Secondary | ICD-10-CM | POA: Diagnosis not present

## 2020-03-14 DIAGNOSIS — Z743 Need for continuous supervision: Secondary | ICD-10-CM | POA: Diagnosis not present

## 2020-03-14 DIAGNOSIS — N184 Chronic kidney disease, stage 4 (severe): Secondary | ICD-10-CM | POA: Diagnosis not present

## 2020-03-14 DIAGNOSIS — R2689 Other abnormalities of gait and mobility: Secondary | ICD-10-CM | POA: Diagnosis not present

## 2020-03-14 DIAGNOSIS — L0291 Cutaneous abscess, unspecified: Secondary | ICD-10-CM | POA: Diagnosis not present

## 2020-03-14 DIAGNOSIS — L989 Disorder of the skin and subcutaneous tissue, unspecified: Secondary | ICD-10-CM | POA: Diagnosis not present

## 2020-03-14 DIAGNOSIS — R2681 Unsteadiness on feet: Secondary | ICD-10-CM | POA: Diagnosis not present

## 2020-03-14 DIAGNOSIS — D649 Anemia, unspecified: Secondary | ICD-10-CM | POA: Diagnosis not present

## 2020-03-14 DIAGNOSIS — U071 COVID-19: Secondary | ICD-10-CM | POA: Diagnosis not present

## 2020-03-14 DIAGNOSIS — L03311 Cellulitis of abdominal wall: Secondary | ICD-10-CM | POA: Diagnosis not present

## 2020-03-14 DIAGNOSIS — L02818 Cutaneous abscess of other sites: Secondary | ICD-10-CM | POA: Diagnosis not present

## 2020-03-14 LAB — GLUCOSE, CAPILLARY
Glucose-Capillary: 87 mg/dL (ref 70–99)
Glucose-Capillary: 71 mg/dL (ref 70–99)
Glucose-Capillary: 83 mg/dL (ref 70–99)

## 2020-03-14 MED ORDER — INSULIN ASPART 100 UNIT/ML ~~LOC~~ SOLN: 0.0000 [IU] | Freq: Three times a day (TID) | SUBCUTANEOUS | 11 refills | Status: DC

## 2020-03-14 MED ORDER — INSULIN GLARGINE 100 UNIT/ML ~~LOC~~ SOLN
12.0000 [IU] | Freq: Two times a day (BID) | SUBCUTANEOUS | Status: DC
  Filled 2020-03-14 (×2): qty 0.12

## 2020-03-14 MED ORDER — INSULIN ASPART 100 UNIT/ML ~~LOC~~ SOLN: 8.0000 [IU] | Freq: Three times a day (TID) | SUBCUTANEOUS | 11 refills | Status: DC

## 2020-03-14 MED ORDER — POTASSIUM CHLORIDE CRYS ER 20 MEQ PO TBCR: 20.0000 meq | EXTENDED_RELEASE_TABLET | Freq: Every day | ORAL | Status: DC | PRN

## 2020-03-14 MED ORDER — TORSEMIDE 20 MG PO TABS: 20.0000 mg | ORAL_TABLET | Freq: Every day | ORAL | Status: DC

## 2020-03-14 MED ORDER — AMOXICILLIN 500 MG PO CAPS: 500.0000 mg | ORAL_CAPSULE | Freq: Three times a day (TID) | ORAL | Status: DC

## 2020-03-14 MED ORDER — INSULIN GLARGINE 100 UNIT/ML ~~LOC~~ SOLN: 12.0000 [IU] | Freq: Two times a day (BID) | SUBCUTANEOUS | 11 refills | Status: DC

## 2020-03-14 MED ORDER — INSULIN ASPART 100 UNIT/ML ~~LOC~~ SOLN: 0.0000 [IU] | Freq: Every day | SUBCUTANEOUS | 11 refills | Status: DC

## 2020-03-14 MED ORDER — INSULIN GLARGINE 100 UNIT/ML ~~LOC~~ SOLN: 15.0000 [IU] | Freq: Two times a day (BID) | SUBCUTANEOUS | 11 refills | Status: DC

## 2020-03-14 MED ORDER — RISAQUAD PO CAPS: 1.0000 | ORAL_CAPSULE | Freq: Every day | ORAL | Status: DC

## 2020-03-14 NOTE — Progress Notes (Signed)
Report given to Katy Apo, staff nurse at [facility], All questions and concerns were answered.

## 2020-03-14 NOTE — Consult Note (Signed)
   [facility] CM Inpatient Consult   03/14/2020  [name] 11-Feb-1951 [id number]   Patient chart reviewed for potential [place] [facility] CM) services due to high unplanned readmission risk score.   Per review, current disposition plan is for SNF. No [facility] CM needs at this time.   [name], MSN, [facility] Liaison Nurse Mobile Phone [phone]  Toll free office [phone]

## 2020-03-14 NOTE — TOC Transition Note (Addendum)
Transition of Care [facility]) - CM/SW Discharge Note   Patient Details  Name: [name] MRN: [id number] Date of Birth: [birth year]  [facility] CM/SW Contact:  Sharin Mons, RN Phone Number: 03/14/2020, 10:29 AM   Clinical Narrative:    Insurance auth. for SNF Received, reference # [id number], [id number].  Patient will DC to: [facility] Anticipated DC date: 03/14/2020 Family notified: [name] (cousin) Transport by: Corey Harold   Per MD patient ready for DC today . RN, patient, patient's family, and facility notified of DC. Discharge Summary and FL2 sent to facility. RN to call report prior to discharge [phone]). Rm# [id number] P. DC packet on chart. Ambulance transport requested for patient.   RNCM will sign off for now as intervention is no longer needed. Please consult [place] again if new needs arise.    Final next level of care: [facility] ([facility]) Barriers to Discharge: No Barriers Identified   Patient Goals and CMS Choice Patient states their goals for this hospitalization and ongoing recovery are:: get strength back to get home      Discharge Placement                       Discharge Plan and Services In-house Referral: [facility] Discharge Planning Services: CM Consult Post Acute Care Choice: [facility]                               Social Determinants of Health (SDOH) Interventions     Readmission Risk Interventions No flowsheet data found.

## 2020-03-14 NOTE — Progress Notes (Signed)
Patient's family/friend was notified this am at 0630 of her fall.  Ms. Bella Kennedy at [phone] stated that she was thankful that patient was not injured.  She stated that she has an appointment this morning but she will be coming to see patient today.

## 2020-03-14 NOTE — Progress Notes (Signed)
Discharge summary packet/pertinent documents provided to [facility] staff. Pt remains alert/oriented in no acute distress. D/C to [facility] as ordered.

## 2020-03-14 NOTE — Plan of Care (Signed)
  Problem: Education: Goal: Knowledge of General Education information will improve Description: Including pain rating scale, medication(s)/side effects and non-pharmacologic comfort measures Outcome: Progressing   Problem: Health Behavior/Discharge Planning: Goal: Ability to manage health-related needs will improve Outcome: Progressing   Problem: Clinical Measurements: Goal: Will remain free from infection Outcome: Progressing   Problem: Pain Managment: Goal: General experience of comfort will improve Outcome: Progressing   

## 2020-03-14 NOTE — Progress Notes (Signed)
At 0152 bed alarm started going off in room 5N08, both NT and nurse entered patient's room.  NT was getting walker and approaching patient while nurse was turning off bed alarm.  Patient stood up and without any indication she just fell backwards to the floor.  This was witnessed by both nurse and NT.  Patient did not hit her head.  There are no visible injuries.  Both nurse and NT helped patient to the bathroom but patient was again unstable for a moment.  She voided and we got her back in bed.  Patient is alert and oriented x 4.  Vitals stable.  Patient denies any pain.  Patient did not want me to call her family/friend Ms. DeLouge (also known as Secretary/administrator) at [phone] until later this morning.  Patient stated "Jonnie Kind will be upset if you wake her at this time".  On call MD notified through [place].  No new orders given at this time.  Charge nurse notified.

## 2020-03-14 NOTE — Discharge Summary (Addendum)
Physician Discharge Summary  [name] URK:[id number] DOB: [birth year] DOA: 03/05/2020  PCP: [name], MD  Admit date: 03/05/2020 Discharge date: 03/14/2020  Admitted From: home Discharge disposition: snf   Recommendations for Outpatient Follow-Up:   1. Continue to monitor blood sugars and adjust insulin as needed 2. abx through: 9/12   Discharge Diagnosis:   Principal Problem:   Cellulitis Active Problems:   Controlled type 2 diabetes mellitus with circulatory disorder, without long-term current use of insulin ([place])   Hypertension associated with diabetes ([place])   Chronic combined systolic and diastolic CHF (congestive heart failure) ([facility])   CKD stage 4 due to type 2 diabetes mellitus ([place])   Hyperglycemia   Acute lower UTI   DKA, type 2 ([place])   Sepsis ([place])    Discharge Condition: Improved.  Diet recommendation: Low sodium, heart healthy.  Carbohydrate-modified.   Wound care: BID wet to dry dressing changes to perineal/groin wounds. There are 3 openings that need to be packed. Lightly dampen kerlex with saline and pack to base of wound, cover with dry gauze or ABD pad and secure with tape  Code status: Full.   History of Present Illness:   [name] is a 69 y.o. 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  Pt in to ED with EMS after laying on floor all night from midnight to 1pm today following a fall at home.  Pt states she is in so much pain from multiple "abscesses in her groin area" that she fell.  Pt hasnt taken any meds in a week it seems.  CBG reading high.   Hospital Course by Problem:   Sepsis (POA) due to right perineal abscess due to E faecalis -Status post I&D on 9/2 -General surgery consultation  appreciated -Patient is status post Vanco and Zosyn and has been changed to p.o. amoxicillin to finish course -Patient is medically stable for discharge and has been for 48  hours  Type 2 diabetes uncontrolled with hyperglycemia -A1c was 7.2 -Sliding scale insulin, Lantus, NovoLog with meals  E faecalis UTI -Continue with amoxicillin as notated above  Anemia of chronic kidney disease -Status post 1 unit of PRBCs on 9/7 -Continue to monitor  Chronic kidney disease stage IV -CR stable  Hypertension -Continue amlodipine -have held other home BP Meds -resume as needed  Morbid obesity Body mass index is 32.68 kg/m.  Nutrition Status: Nutrition Problem: Increased nutrient needs Etiology: post-op healing Signs/Symptoms: estimated needs Interventions: MVI, Premier Protein, Juven    Medical Consultants:   General surgery   Discharge Exam:   Vitals:   03/14/20 0511 03/14/20 0748  BP: (!) 127/59 (!) 130/50  Pulse: 76 84  Resp:  18  Temp: 98.9 F (37.2 C) 98.9 F (37.2 C)  SpO2: 100% 100%   Vitals:   03/14/20 0155 03/14/20 0237 03/14/20 0511 03/14/20 0748  BP: (!) 159/70 (!) 159/70 (!) 127/59 (!) 130/50  Pulse: 88 88 76 84  Resp: 16 16  18   Temp: 98.9 F (37.2 C) 98.9 F (37.2 C) 98.9 F (37.2 C) 98.9 F (37.2 C)  TempSrc: Oral Oral Oral Oral  SpO2: 100% 100% 100% 100%  Weight:      Height:        General exam: Appears calm and comfortable.    The results of significant diagnostics from this hospitalization (including imaging, microbiology, ancillary and laboratory) are listed below for reference.  Procedures and Diagnostic Studies:   CT ABDOMEN PELVIS WO CONTRAST  Addendum Date: 03/06/2020   ADDENDUM REPORT: 03/06/2020 21:05 ADDENDUM: Critical Value/emergent results were called by telephone at the time of interpretation on 03/06/2020 at 9:05 pm to provider Sharlet Salina NP , who verbally acknowledged these results. Electronically Signed   By: Lovena Le M.D.   On: 03/06/2020 21:05   Result Date: 03/06/2020 CLINICAL DATA:  Abdominal pain, abdominal and groin cellulitis EXAM: CT ABDOMEN AND PELVIS WITHOUT CONTRAST  TECHNIQUE: Multidetector CT imaging of the abdomen and pelvis was performed following the standard protocol without IV contrast. COMPARISON:  None. FINDINGS: Lower chest: Lung bases are clear. Cardiac size at the upper limits normal. Coronary artery calcifications are present. Suspect calcification of the mitral annulus as well. Hypoattenuation of the cardiac blood pool may reflect a mild anemia. Hepatobiliary: No visible concerning liver lesion within the limitations of this unenhanced of motion degraded CT. Smooth liver surface contour. Normal liver attenuation. Gallbladder is unremarkable. Question some biliary ductal dilatation difficult to fully discern given the lack of contrast media and extensive motion artifact. The extrahepatic common bile duct may measure up to 1.5 cm at the level of the pancreatic head. No visible intraductal gallstones. Pancreas: Poorly assessed given extensive motion artifact photon starvation. No pancreatic ductal dilatation or surrounding inflammatory changes. Spleen: Normal in size. No concerning splenic lesions. Splenic hilar vascular calcifications are noted. Adrenals/Urinary Tract: Adrenal glands are unremarkable. Kidneys are symmetric in size and normally located. Question some mild perinephric stranding versus volume averaging in the setting of motion artifact. No visible concerning renal lesion. No urolithiasis or hydronephrosis. Urinary bladder is unremarkable. Stomach/Bowel: Small sliding-type hiatal hernia. Distal stomach and duodenum are unremarkable. No small or large bowel thickening or dilatation. No evidence of bowel obstruction. Appendix is not visualized. No focal inflammation the vicinity of the cecum to suggest an occult appendicitis. Vascular/Lymphatic: Atherosclerotic calcifications within the abdominal aorta and branch vessels. No aneurysm or ectasia. No enlarged abdominopelvic lymph nodes. Likely reactive adenopathy seen in the inguinal regions. Reproductive:  Anteverted uterus. Question endometrial thickening, greater than expected for patient age measuring up to 10 mm in struck thickness. No concerning adnexal lesions. Few parametrial calcifications are atypical senescent finding Other: There is extensive soft tissue stranding and thickening extending across the low anterior abdominal wall/panniculus. Large lobular phlegmon in the right groin/pudendal tissues superficial to the right adductor musculature and right inferior pubic ramus anteriorly with small amount of soft tissue gas. Is region is difficult to accurately measure given the lobular margins of med does extend up to 4.3 x 6.2 cm in maximal transaxial dimensions. Organized collection/abscess formation is difficult to ascertain in the absence of contrast media. Musculoskeletal: Soft tissue thickening and edematous changes of the adductor compartment of the right thigh could reflect some reactive or inflammatory myositis. No discernible intramuscular collection is seen. No acute or suspicious osseous lesions. Multilevel degenerative changes in the spine, hips and pelvis. Mild grade 1 anterolisthesis L4 on 5. No spondylolysis is evident. IMPRESSION: 1. Extensive soft tissue stranding and thickening extending across the low anterior abdominal wall/panniculus with a large, heterogeneous phlegmon within the deep space of the right groin and pudendal soft tissues along the right adductor musculature and right inferior pubic ramus with small amount of soft tissue gas. Organized collection/abscess formation is difficult to ascertain in the absence of contrast media. Findings are concerning for severe cellulitis and aggressive soft tissue infection. Emergent surgical consultation is warranted. 2. Soft tissue thickening  and edematous changes of the adductor compartment of the right thigh could reflect some reactive or inflammatory myositis. No discernible intramuscular collection is seen. 3. Question endometrial  thickening, greater than expected for patient age measuring up to 10 mm in thickness. Recommend further evaluation with outpatient pelvic ultrasound when patient is able to better tolerate. 4. Aortic Atherosclerosis (ICD10-I70.0). Currently attempting to contact the ordering provider with a critical value result. Addendum will be submitted upon case discussion. Electronically Signed: By: Lovena Le M.D. On: 03/06/2020 21:02   CT HEAD WO CONTRAST  Result Date: 03/06/2020 CLINICAL DATA:  Mental status change and cause EXAM: CT HEAD WITHOUT CONTRAST TECHNIQUE: Contiguous axial images were obtained from the base of the skull through the vertex without intravenous contrast. COMPARISON:  CT head 06/06/2013 FINDINGS: Brain: No evidence of acute infarction, hemorrhage, hydrocephalus, extra-axial collection or mass lesion/mass effect. Symmetric prominence of the ventricles, cisterns and sulci compatible with parenchymal volume loss. Patchy areas of white matter hypoattenuation are most compatible with chronic microvascular angiopathy. Vascular: Atherosclerotic calcification of the carotid siphons0 and left intradural vertebral artery. No worrisome hyperdense vessel. Skull: No scalp swelling or calvarial fracture. No acute or suspicious osseous lesions Sinuses/Orbits: Minimal thickening in the ethmoid air cells. Remaining paranasal sinuses and slightly hypo pneumatized mastoid air cells are predominantly clear. Included orbital structures are unremarkable. Other: Debris noted in the left external auditory canal. IMPRESSION: 1. No acute intracranial findings. 2. Mild parenchymal volume loss and chronic microvascular angiopathy. 3. Debris in the left external auditory canal, correlate for cerumen impaction. Electronically Signed   By: Lovena Le M.D.   On: 03/06/2020 20:46   DG Chest Port 1 View  Result Date: 03/06/2020 CLINICAL DATA:  Diabetic ketoacidosis EXAM: PORTABLE CHEST 1 VIEW COMPARISON:  04/13/2016 FINDINGS:  Minimal left basilar opacity. No pleural effusion. Stable cardiomediastinal silhouette with aortic atherosclerosis. No pneumothorax. IMPRESSION: Minimal left basilar opacity, atelectasis versus mild pneumonia. Electronically Signed   By: Donavan Foil M.D.   On: 03/06/2020 17:28     Labs:   Basic Metabolic Panel: Recent Labs  Lab 03/09/20 0839 03/09/20 0839 03/10/20 0317 03/10/20 0317 03/11/20 0357 03/11/20 0357 03/11/20 2216 03/13/20 1711  NA 135  --  139  --  139  --  139 137  K 4.2   < > 4.0   < > 3.7   < > 3.7 3.6  CL 107  --  109  --  109  --  108 106  CO2 21*  --  21*  --  24  --  22 21*  GLUCOSE 284*  --  129*  --  132*  --  136* 192*  BUN 71*  --  63*  --  55*  --  50* 25*  CREATININE 2.22*  --  1.81*  --  1.80*  --  1.78* 1.54*  CALCIUM 8.2*  --  8.4*  --  8.2*  --  8.0* 8.0*   < > = values in this interval not displayed.   [name] Estimated Creatinine Clearance: 36.7 mL/min (A) (by C-G formula based on SCr of 1.54 mg/dL (H)). Liver Function Tests: No results for input(s): AST, ALT, ALKPHOS, BILITOT, PROT, ALBUMIN in the last 168 hours. No results for input(s): LIPASE, AMYLASE in the last 168 hours. No results for input(s): AMMONIA in the last 168 hours. Coagulation profile No results for input(s): INR, PROTIME in the last 168 hours.  CBC: Recent Labs  Lab 03/09/20 0839 03/09/20 0839 03/10/20 8315  03/11/20 0357 03/11/20 2216 03/12/20 1530 03/13/20 1711  WBC 20.2*  --  19.3* 17.0* 13.9*  --  13.3*  NEUTROABS 17.6*  --  17.6* 14.5* 12.2*  --  11.0*  HGB 7.1*   < > 7.3* 7.2* 6.6* 8.9* 8.7*  HCT 22.5*   < > 23.2* 23.7* 21.9* 27.9* 27.8*  MCV 90.7  --  90.6 92.9 93.6  --  92.1  PLT 200  --  180 181 184  --  242   < > = values in this interval not displayed.   Cardiac Enzymes: No results for input(s): CKTOTAL, CKMB, CKMBINDEX, TROPONINI in the last 168 hours. BNP: Invalid input(s): POCBNP CBG: Recent Labs  Lab 03/13/20 1725 03/13/20 2024 03/14/20 0330  03/14/20 0636 03/14/20 0743  GLUCAP 167* 139* 87 71 83   D-Dimer No results for input(s): DDIMER in the last 72 hours. Hgb A1c No results for input(s): HGBA1C in the last 72 hours. Lipid Profile No results for input(s): CHOL, HDL, LDLCALC, TRIG, CHOLHDL, LDLDIRECT in the last 72 hours. Thyroid function studies No results for input(s): TSH, T4TOTAL, T3FREE, THYROIDAB in the last 72 hours.  Invalid input(s): FREET3 Anemia work up No results for input(s): VITAMINB12, FOLATE, FERRITIN, TIBC, IRON, RETICCTPCT in the last 72 hours. Microbiology Recent Results (from the past 240 hour(s))  SARS Coronavirus 2 by RT PCR (hospital order, performed in [facility] hospital lab) Nasopharyngeal Nasopharyngeal Swab     Status: None   Collection Time: 03/06/20  3:06 AM   Specimen: Nasopharyngeal Swab  Result Value Ref Range Status   SARS Coronavirus 2 NEGATIVE NEGATIVE Final    Comment: (NOTE) SARS-CoV-2 target nucleic acids are NOT DETECTED.  The SARS-CoV-2 RNA is generally detectable in upper and lower respiratory specimens during the acute phase of infection. The lowest concentration of SARS-CoV-2 viral copies this assay can detect is 250 copies / mL. A negative result does not preclude SARS-CoV-2 infection and should not be used as the sole basis for treatment or other patient management decisions.  A negative result may occur with improper specimen collection / handling, submission of specimen other than nasopharyngeal swab, presence of viral mutation(s) within the areas targeted by this assay, and inadequate number of viral copies (<250 copies / mL). A negative result must be combined with clinical observations, patient history, and epidemiological information.  Fact Sheet for Patients:   StrictlyIdeas.no  Fact Sheet for Healthcare Providers: BankingDealers.co.za  This test is not yet approved or  cleared by the [place] FDA  and has been authorized for detection and/or diagnosis of SARS-CoV-2 by FDA under an Emergency Use Authorization (EUA).  This EUA will remain in effect (meaning this test can be used) for the duration of the COVID-19 declaration under Section 564(b)(1) of the Act, 21 U.S.C. section 360bbb-3(b)(1), unless the authorization is terminated or revoked sooner.  Performed at [facility], [place] [address], [place], [place] [address]   Urine culture     Status: Abnormal   Collection Time: 03/06/20  4:50 AM   Specimen: Urine, Random  Result Value Ref Range Status   Specimen Description URINE, RANDOM  Final   Special Requests   Final    NONE Performed at [facility], [place] [address], [place], [place] [address]    Culture 60,000 COLONIES/mL ENTEROCOCCUS FAECALIS (A)  Final   Report Status 03/08/2020 FINAL  Final   Organism ID, Bacteria ENTEROCOCCUS FAECALIS (A)  Final      Susceptibility  Enterococcus faecalis - MIC*    AMPICILLIN <=2 SENSITIVE Sensitive     NITROFURANTOIN <=16 SENSITIVE Sensitive     VANCOMYCIN 1 SENSITIVE Sensitive     * 60,000 COLONIES/mL ENTEROCOCCUS FAECALIS  Culture, blood (routine x 2)     Status: None   Collection Time: 03/06/20  6:00 AM   Specimen: BLOOD RIGHT FOREARM  Result Value Ref Range Status   Specimen Description BLOOD RIGHT FOREARM  Final   Special Requests   Final    BOTTLES DRAWN AEROBIC AND ANAEROBIC Blood Culture adequate volume   Culture   Final    NO GROWTH 5 DAYS Performed at [facility], [place] [address] [place], [place] [address]    Report Status 03/11/2020 FINAL  Final  Culture, blood (routine x 2)     Status: None   Collection Time: 03/06/20  8:33 AM   Specimen: BLOOD LEFT HAND  Result Value Ref Range Status   Specimen Description BLOOD LEFT HAND  Final   Special Requests   Final    BOTTLES DRAWN AEROBIC ONLY Blood Culture results may not be optimal due to an inadequate volume of blood received in culture bottles    Culture   Final    NO GROWTH 5 DAYS Performed at [facility], [place] [address] [place], [place] [address]    Report Status 03/11/2020 FINAL  Final  MRSA PCR Screening     Status: None   Collection Time: 03/06/20 10:07 PM   Specimen: Nasal Mucosa; Nasopharyngeal  Result Value Ref Range Status   MRSA by PCR NEGATIVE NEGATIVE Final    Comment:        The GeneXpert MRSA Assay (FDA approved for NASAL specimens only), is one component of a comprehensive MRSA colonization surveillance program. It is not intended to diagnose MRSA infection nor to guide or monitor treatment for MRSA infections. Performed at [facility], [place] [address], [place], [place] [address]   Aerobic/Anaerobic Culture (surgical/deep wound)     Status: None   Collection Time: 03/07/20  4:07 PM   Specimen: PATH Other; Tissue  Result Value Ref Range Status   Specimen Description ABSCESS PERINEAL RIGHT  Final   Special Requests PT ON ANCEF ZOSYN  Final   Gram Stain   Final    FEW WBC PRESENT, PREDOMINANTLY PMN ABUNDANT GRAM NEGATIVE RODS ABUNDANT GRAM POSITIVE COCCI IN PAIRS MODERATE GRAM POSITIVE RODS Performed at [facility], [place] [address] [place], [place] [address]    Culture   Final    FEW ENTEROCOCCUS FAECALIS MIXED ANAEROBIC FLORA PRESENT.  CALL LAB IF FURTHER IID REQUIRED.    Report Status 03/13/2020 FINAL  Final   Organism ID, Bacteria ENTEROCOCCUS FAECALIS  Final      Susceptibility   Enterococcus faecalis - MIC*    AMPICILLIN <=2 SENSITIVE Sensitive     VANCOMYCIN 1 SENSITIVE Sensitive     GENTAMICIN SYNERGY SENSITIVE Sensitive     * FEW ENTEROCOCCUS FAECALIS  SARS CORONAVIRUS 2 (TAT 6-24 HRS) Nasopharyngeal Nasopharyngeal Swab     Status: None   Collection Time: 03/13/20  1:44 PM   Specimen: Nasopharyngeal Swab  Result Value Ref Range Status   SARS Coronavirus 2 NEGATIVE NEGATIVE Final    Comment: (NOTE) SARS-CoV-2 target nucleic acids are NOT DETECTED.  The SARS-CoV-2  RNA is generally detectable in upper and lower respiratory specimens during the acute phase of infection. Negative results do not preclude SARS-CoV-2 infection, do not  rule out co-infections with other pathogens, and should not be used as the sole basis for treatment or other patient management decisions. Negative results must be combined with clinical observations, patient history, and epidemiological information. The expected result is Negative.  Fact Sheet for Patients: SugarRoll.be  Fact Sheet for Healthcare Providers: https://www.woods-mathews.com/  This test is not yet approved or cleared by the [place] FDA and  has been authorized for detection and/or diagnosis of SARS-CoV-2 by FDA under an Emergency Use Authorization (EUA). This EUA will remain  in effect (meaning this test can be used) for the duration of the COVID-19 declaration under Se ction 564(b)(1) of the Act, 21 U.S.C. section 360bbb-3(b)(1), unless the authorization is terminated or revoked sooner.  Performed at [facility], [place] [address] [place], [place] [address]      Discharge Instructions:   Discharge Instructions    AMB referral to wound care center   Complete by: As directed    Right groin/perineal wounds.  Follow up with Dr. Heber [place] for evaluation and management.   Diet - low sodium heart healthy   Complete by: As directed    Diet Carb Modified   Complete by: As directed    Discharge wound care:   Complete by: As directed    BID wet to dry dressing changes to perineal/groin wounds. There are 3 openings that need to be packed. Lightly dampen kerlex with saline and pack to base of wound, cover with dry gauze or ABD pad and secure with tape   Increase activity slowly   Complete by: As directed      Allergies as of 03/14/2020      Reactions   Tradjenta [linagliptin] Anaphylaxis   CP   Atorvastatin    REACTION: aches and pains   Enalapril  Maleate    REACTION: cough   Farxiga [dapagliflozin] Itching   Hydrochlorothiazide    REACTION: hair loss   Kenalog [triamcinolone Acetonide]    HANDS NUMB   Metformin And Related    Diarrhea, dizziness   Propoxyphene N-acetaminophen Hives   Simvastatin    REACTION: cramps   Spironolactone    REACTION: cramps      Medication List    STOP taking these medications   Bystolic 10 MG tablet Generic drug: nebivolol   glipiZIDE 5 MG 24 hr tablet Commonly known as: GLUCOTROL XL   hydrALAZINE 25 MG tablet Commonly known as: APRESOLINE   nitroGLYCERIN 0.4 MG SL tablet Commonly known as: NITROSTAT     TAKE these medications   acidophilus Caps capsule Take 1 capsule by mouth daily.   amLODipine 10 MG tablet Commonly known as: NORVASC TAKE ONE-HALF TABLET BY  MOUTH DAILY   amoxicillin 500 MG capsule Commonly known as: AMOXIL Take 1 capsule (500 mg total) by mouth every 8 (eight) hours.   aspirin 81 MG chewable tablet Chew 1 tablet (81 mg total) by mouth daily.   calcium carbonate 600 MG Tabs tablet Commonly known as: OS-CAL Take 600 mg by mouth 2 (two) times daily with a meal.   hydrOXYzine 25 MG tablet Commonly known as: ATARAX/VISTARIL Take 1-2 tablets (25-50 mg total) by mouth every 8 (eight) hours as needed for itching.   insulin aspart 100 UNIT/ML injection Commonly known as: novoLOG Inject 0-15 Units into the skin 3 (three) times daily with meals.   insulin aspart 100 UNIT/ML injection Commonly known as: novoLOG Inject 0-5 Units into the skin at bedtime.   insulin aspart 100 UNIT/ML injection  Commonly known as: novoLOG Inject 8 Units into the skin 3 (three) times daily with meals.   insulin glargine 100 UNIT/ML injection Commonly known as: LANTUS Inject 0.12 mLs (12 Units total) into the skin 2 (two) times daily.   lansoprazole 15 MG capsule Commonly known as: Prevacid Take 1 capsule (15 mg total) by mouth daily at 12 noon. What changed:   when to  take this  reasons to take this   ondansetron 4 MG tablet Commonly known as: Zofran Take 1 tablet (4 mg total) by mouth every 8 (eight) hours as needed for nausea or vomiting.   OneTouch Ultra test strip Generic drug: glucose blood USE TO TEST BLOOD SUGAR TWO TIMES A DAY AS DIRECTED   potassium chloride SA 20 MEQ tablet Commonly known as: KLOR-CON Take 1 tablet (20 mEq total) by mouth daily as needed (cramping).   rosuvastatin 5 MG tablet Commonly known as: CRESTOR TAKE 1 TABLET BY MOUTH  DAILY AT 6PM What changed: See the new instructions.   torsemide 20 MG tablet Commonly known as: DEMADEX Take 1 tablet (20 mg total) by mouth daily. What changed:   medication strength  how much to take   vitamin B-12 1000 MCG tablet Commonly known as: CYANOCOBALAMIN Take 1,000 mcg by mouth daily.   Vitamin D3 50 MCG (2000 UT) capsule Take 1 capsule (2,000 Units total) by mouth daily.            Discharge Care Instructions  (From admission, onward)         Start     Ordered   03/14/20 0000  Discharge wound care:       Comments: BID wet to dry dressing changes to perineal/groin wounds. There are 3 openings that need to be packed. Lightly dampen kerlex with saline and pack to base of wound, cover with dry gauze or ABD pad and secure with tape   03/14/20 0818          Follow-up Information    Kalman Shan Ratliff, DO Follow up in 2 week(s).   Specialty: Internal Medicine Why: their office will call you to arrange and appointment for follow up for your wounds Contact information: [place] [address] [phone]                Time coordinating discharge: 35 min  Signed:  Geradine Girt DO  [facility] 03/14/2020, 8:45 AM

## 2020-03-15 DIAGNOSIS — L02214 Cutaneous abscess of groin: Secondary | ICD-10-CM | POA: Diagnosis not present

## 2020-03-15 DIAGNOSIS — A419 Sepsis, unspecified organism: Secondary | ICD-10-CM | POA: Diagnosis not present

## 2020-03-15 DIAGNOSIS — R6520 Severe sepsis without septic shock: Secondary | ICD-10-CM | POA: Diagnosis not present

## 2020-03-18 DIAGNOSIS — L02818 Cutaneous abscess of other sites: Secondary | ICD-10-CM | POA: Diagnosis not present

## 2020-03-18 DIAGNOSIS — I509 Heart failure, unspecified: Secondary | ICD-10-CM | POA: Diagnosis not present

## 2020-03-18 DIAGNOSIS — L0291 Cutaneous abscess, unspecified: Secondary | ICD-10-CM | POA: Diagnosis not present

## 2020-03-18 DIAGNOSIS — E119 Type 2 diabetes mellitus without complications: Secondary | ICD-10-CM | POA: Diagnosis not present

## 2020-03-18 DIAGNOSIS — L989 Disorder of the skin and subcutaneous tissue, unspecified: Secondary | ICD-10-CM | POA: Diagnosis not present

## 2020-03-18 DIAGNOSIS — N184 Chronic kidney disease, stage 4 (severe): Secondary | ICD-10-CM | POA: Diagnosis not present

## 2020-03-19 DIAGNOSIS — D649 Anemia, unspecified: Secondary | ICD-10-CM | POA: Diagnosis not present

## 2020-03-19 DIAGNOSIS — N184 Chronic kidney disease, stage 4 (severe): Secondary | ICD-10-CM | POA: Diagnosis not present

## 2020-03-19 DIAGNOSIS — R6520 Severe sepsis without septic shock: Secondary | ICD-10-CM | POA: Diagnosis not present

## 2020-03-25 DIAGNOSIS — A419 Sepsis, unspecified organism: Secondary | ICD-10-CM | POA: Diagnosis not present

## 2020-03-25 DIAGNOSIS — R2681 Unsteadiness on feet: Secondary | ICD-10-CM | POA: Diagnosis not present

## 2020-03-25 DIAGNOSIS — M6281 Muscle weakness (generalized): Secondary | ICD-10-CM | POA: Diagnosis not present

## 2020-03-25 DIAGNOSIS — E118 Type 2 diabetes mellitus with unspecified complications: Secondary | ICD-10-CM | POA: Diagnosis not present

## 2020-03-25 DIAGNOSIS — E119 Type 2 diabetes mellitus without complications: Secondary | ICD-10-CM | POA: Diagnosis not present

## 2020-03-25 DIAGNOSIS — L02214 Cutaneous abscess of groin: Secondary | ICD-10-CM | POA: Diagnosis not present

## 2020-03-25 DIAGNOSIS — L0291 Cutaneous abscess, unspecified: Secondary | ICD-10-CM | POA: Diagnosis not present

## 2020-03-26 ENCOUNTER — Ambulatory Visit: Payer: Medicare Other | Admitting: Internal Medicine

## 2020-03-26 DIAGNOSIS — L02818 Cutaneous abscess of other sites: Secondary | ICD-10-CM | POA: Diagnosis not present

## 2020-03-26 DIAGNOSIS — L989 Disorder of the skin and subcutaneous tissue, unspecified: Secondary | ICD-10-CM | POA: Diagnosis not present

## 2020-03-27 ENCOUNTER — Telehealth: Payer: Self-pay | Admitting: Internal Medicine

## 2020-03-27 DIAGNOSIS — N184 Chronic kidney disease, stage 4 (severe): Secondary | ICD-10-CM | POA: Diagnosis not present

## 2020-03-27 DIAGNOSIS — E118 Type 2 diabetes mellitus with unspecified complications: Secondary | ICD-10-CM | POA: Diagnosis not present

## 2020-03-27 NOTE — Telephone Encounter (Signed)
Patient called and needs advice - she has been in rehab (see Epic history) and the patient is being told to take Lantus in pen form instead of the Glipizide as prescribed by Dr Renne Crigler. Patient does not know which medication to take and how to proceed.  Please call at [phone] (land line)

## 2020-03-27 NOTE — Telephone Encounter (Signed)
[name], i believe she can go back to her previous regimen with 5 mg glipizide in am, but let us know right away if the sugars are not controlled, in which case may need to add lantus back until they improve. C

## 2020-03-27 NOTE — Telephone Encounter (Signed)
Please advise 

## 2020-03-28 NOTE — Telephone Encounter (Signed)
Notified patient of message from Dr. Gherghe, patient expressed understanding and agreement. No further questions.  

## 2020-04-01 ENCOUNTER — Telehealth: Payer: Self-pay | Admitting: Internal Medicine

## 2020-04-01 NOTE — Telephone Encounter (Signed)
  FYI  Darlene from [facility] calling to report they will be able to see patient for nursing on 10/2 maybe sooner. There was a mixup upon discharge and order was placed on wrong patient with last name [place].  Darlene [phone]

## 2020-04-02 NOTE — Telephone Encounter (Signed)
Noted. Thx.

## 2020-04-02 NOTE — Telephone Encounter (Signed)
FYI

## 2020-04-05 ENCOUNTER — Telehealth: Payer: Self-pay | Admitting: Internal Medicine

## 2020-04-05 DIAGNOSIS — L02214 Cutaneous abscess of groin: Secondary | ICD-10-CM | POA: Diagnosis not present

## 2020-04-05 DIAGNOSIS — I5040 Unspecified combined systolic (congestive) and diastolic (congestive) heart failure: Secondary | ICD-10-CM | POA: Diagnosis not present

## 2020-04-05 DIAGNOSIS — B952 Enterococcus as the cause of diseases classified elsewhere: Secondary | ICD-10-CM | POA: Diagnosis not present

## 2020-04-05 DIAGNOSIS — N184 Chronic kidney disease, stage 4 (severe): Secondary | ICD-10-CM | POA: Diagnosis not present

## 2020-04-05 DIAGNOSIS — Z8673 Personal history of transient ischemic attack (TIA), and cerebral infarction without residual deficits: Secondary | ICD-10-CM | POA: Diagnosis not present

## 2020-04-05 DIAGNOSIS — I2510 Atherosclerotic heart disease of native coronary artery without angina pectoris: Secondary | ICD-10-CM | POA: Diagnosis not present

## 2020-04-05 DIAGNOSIS — D631 Anemia in chronic kidney disease: Secondary | ICD-10-CM | POA: Diagnosis not present

## 2020-04-05 DIAGNOSIS — F17210 Nicotine dependence, cigarettes, uncomplicated: Secondary | ICD-10-CM | POA: Diagnosis not present

## 2020-04-05 DIAGNOSIS — E785 Hyperlipidemia, unspecified: Secondary | ICD-10-CM | POA: Diagnosis not present

## 2020-04-05 DIAGNOSIS — I252 Old myocardial infarction: Secondary | ICD-10-CM | POA: Diagnosis not present

## 2020-04-05 DIAGNOSIS — K219 Gastro-esophageal reflux disease without esophagitis: Secondary | ICD-10-CM | POA: Diagnosis not present

## 2020-04-05 DIAGNOSIS — J449 Chronic obstructive pulmonary disease, unspecified: Secondary | ICD-10-CM | POA: Diagnosis not present

## 2020-04-05 DIAGNOSIS — D509 Iron deficiency anemia, unspecified: Secondary | ICD-10-CM | POA: Diagnosis not present

## 2020-04-05 DIAGNOSIS — L03314 Cellulitis of groin: Secondary | ICD-10-CM | POA: Diagnosis not present

## 2020-04-05 DIAGNOSIS — Z85038 Personal history of other malignant neoplasm of large intestine: Secondary | ICD-10-CM | POA: Diagnosis not present

## 2020-04-05 DIAGNOSIS — Z7982 Long term (current) use of aspirin: Secondary | ICD-10-CM | POA: Diagnosis not present

## 2020-04-05 DIAGNOSIS — N390 Urinary tract infection, site not specified: Secondary | ICD-10-CM | POA: Diagnosis not present

## 2020-04-05 DIAGNOSIS — I255 Ischemic cardiomyopathy: Secondary | ICD-10-CM | POA: Diagnosis not present

## 2020-04-05 DIAGNOSIS — I130 Hypertensive heart and chronic kidney disease with heart failure and stage 1 through stage 4 chronic kidney disease, or unspecified chronic kidney disease: Secondary | ICD-10-CM | POA: Diagnosis not present

## 2020-04-05 DIAGNOSIS — E1122 Type 2 diabetes mellitus with diabetic chronic kidney disease: Secondary | ICD-10-CM | POA: Diagnosis not present

## 2020-04-05 NOTE — Telephone Encounter (Signed)
[place] with [facility] called and is requesting verbals for nursing for wound care. She the frequency would depend on the wounds. She said that she was starting it for once this week and then twice for the next two weeks.   Marcene [phone]

## 2020-04-08 ENCOUNTER — Telehealth: Payer: Self-pay | Admitting: Internal Medicine

## 2020-04-08 DIAGNOSIS — I2510 Atherosclerotic heart disease of native coronary artery without angina pectoris: Secondary | ICD-10-CM | POA: Diagnosis not present

## 2020-04-08 DIAGNOSIS — Z8673 Personal history of transient ischemic attack (TIA), and cerebral infarction without residual deficits: Secondary | ICD-10-CM | POA: Diagnosis not present

## 2020-04-08 DIAGNOSIS — L03314 Cellulitis of groin: Secondary | ICD-10-CM | POA: Diagnosis not present

## 2020-04-08 DIAGNOSIS — L02214 Cutaneous abscess of groin: Secondary | ICD-10-CM | POA: Diagnosis not present

## 2020-04-08 DIAGNOSIS — N184 Chronic kidney disease, stage 4 (severe): Secondary | ICD-10-CM | POA: Diagnosis not present

## 2020-04-08 DIAGNOSIS — I5040 Unspecified combined systolic (congestive) and diastolic (congestive) heart failure: Secondary | ICD-10-CM | POA: Diagnosis not present

## 2020-04-08 DIAGNOSIS — I255 Ischemic cardiomyopathy: Secondary | ICD-10-CM | POA: Diagnosis not present

## 2020-04-08 DIAGNOSIS — Z85038 Personal history of other malignant neoplasm of large intestine: Secondary | ICD-10-CM | POA: Diagnosis not present

## 2020-04-08 DIAGNOSIS — I252 Old myocardial infarction: Secondary | ICD-10-CM | POA: Diagnosis not present

## 2020-04-08 DIAGNOSIS — B952 Enterococcus as the cause of diseases classified elsewhere: Secondary | ICD-10-CM | POA: Diagnosis not present

## 2020-04-08 DIAGNOSIS — D509 Iron deficiency anemia, unspecified: Secondary | ICD-10-CM | POA: Diagnosis not present

## 2020-04-08 DIAGNOSIS — D631 Anemia in chronic kidney disease: Secondary | ICD-10-CM | POA: Diagnosis not present

## 2020-04-08 DIAGNOSIS — F17210 Nicotine dependence, cigarettes, uncomplicated: Secondary | ICD-10-CM | POA: Diagnosis not present

## 2020-04-08 DIAGNOSIS — N390 Urinary tract infection, site not specified: Secondary | ICD-10-CM | POA: Diagnosis not present

## 2020-04-08 DIAGNOSIS — K219 Gastro-esophageal reflux disease without esophagitis: Secondary | ICD-10-CM | POA: Diagnosis not present

## 2020-04-08 DIAGNOSIS — I130 Hypertensive heart and chronic kidney disease with heart failure and stage 1 through stage 4 chronic kidney disease, or unspecified chronic kidney disease: Secondary | ICD-10-CM | POA: Diagnosis not present

## 2020-04-08 DIAGNOSIS — J449 Chronic obstructive pulmonary disease, unspecified: Secondary | ICD-10-CM | POA: Diagnosis not present

## 2020-04-08 DIAGNOSIS — E785 Hyperlipidemia, unspecified: Secondary | ICD-10-CM | POA: Diagnosis not present

## 2020-04-08 DIAGNOSIS — Z7982 Long term (current) use of aspirin: Secondary | ICD-10-CM | POA: Diagnosis not present

## 2020-04-08 DIAGNOSIS — E1122 Type 2 diabetes mellitus with diabetic chronic kidney disease: Secondary | ICD-10-CM | POA: Diagnosis not present

## 2020-04-08 NOTE — Telephone Encounter (Signed)
[place] Verbal orders Please call [phone]   Home Health PT once weekly for five weeks

## 2020-04-09 DIAGNOSIS — B952 Enterococcus as the cause of diseases classified elsewhere: Secondary | ICD-10-CM | POA: Diagnosis not present

## 2020-04-09 DIAGNOSIS — Z8673 Personal history of transient ischemic attack (TIA), and cerebral infarction without residual deficits: Secondary | ICD-10-CM | POA: Diagnosis not present

## 2020-04-09 DIAGNOSIS — K219 Gastro-esophageal reflux disease without esophagitis: Secondary | ICD-10-CM | POA: Diagnosis not present

## 2020-04-09 DIAGNOSIS — Z7982 Long term (current) use of aspirin: Secondary | ICD-10-CM | POA: Diagnosis not present

## 2020-04-09 DIAGNOSIS — N390 Urinary tract infection, site not specified: Secondary | ICD-10-CM | POA: Diagnosis not present

## 2020-04-09 DIAGNOSIS — I255 Ischemic cardiomyopathy: Secondary | ICD-10-CM | POA: Diagnosis not present

## 2020-04-09 DIAGNOSIS — E785 Hyperlipidemia, unspecified: Secondary | ICD-10-CM | POA: Diagnosis not present

## 2020-04-09 DIAGNOSIS — Z85038 Personal history of other malignant neoplasm of large intestine: Secondary | ICD-10-CM | POA: Diagnosis not present

## 2020-04-09 DIAGNOSIS — L02214 Cutaneous abscess of groin: Secondary | ICD-10-CM | POA: Diagnosis not present

## 2020-04-09 DIAGNOSIS — N184 Chronic kidney disease, stage 4 (severe): Secondary | ICD-10-CM | POA: Diagnosis not present

## 2020-04-09 DIAGNOSIS — I252 Old myocardial infarction: Secondary | ICD-10-CM | POA: Diagnosis not present

## 2020-04-09 DIAGNOSIS — D631 Anemia in chronic kidney disease: Secondary | ICD-10-CM | POA: Diagnosis not present

## 2020-04-09 DIAGNOSIS — F17210 Nicotine dependence, cigarettes, uncomplicated: Secondary | ICD-10-CM | POA: Diagnosis not present

## 2020-04-09 DIAGNOSIS — L03314 Cellulitis of groin: Secondary | ICD-10-CM | POA: Diagnosis not present

## 2020-04-09 DIAGNOSIS — I5040 Unspecified combined systolic (congestive) and diastolic (congestive) heart failure: Secondary | ICD-10-CM | POA: Diagnosis not present

## 2020-04-09 DIAGNOSIS — D509 Iron deficiency anemia, unspecified: Secondary | ICD-10-CM | POA: Diagnosis not present

## 2020-04-09 DIAGNOSIS — I2510 Atherosclerotic heart disease of native coronary artery without angina pectoris: Secondary | ICD-10-CM | POA: Diagnosis not present

## 2020-04-09 DIAGNOSIS — I130 Hypertensive heart and chronic kidney disease with heart failure and stage 1 through stage 4 chronic kidney disease, or unspecified chronic kidney disease: Secondary | ICD-10-CM | POA: Diagnosis not present

## 2020-04-09 DIAGNOSIS — J449 Chronic obstructive pulmonary disease, unspecified: Secondary | ICD-10-CM | POA: Diagnosis not present

## 2020-04-09 DIAGNOSIS — E1122 Type 2 diabetes mellitus with diabetic chronic kidney disease: Secondary | ICD-10-CM | POA: Diagnosis not present

## 2020-04-09 NOTE — Telephone Encounter (Signed)
Okay.  Thanks.

## 2020-04-10 ENCOUNTER — Encounter: Payer: Self-pay | Admitting: Internal Medicine

## 2020-04-10 ENCOUNTER — Other Ambulatory Visit: Payer: Self-pay

## 2020-04-10 ENCOUNTER — Ambulatory Visit (INDEPENDENT_AMBULATORY_CARE_PROVIDER_SITE_OTHER): Payer: Medicare Other | Admitting: Internal Medicine

## 2020-04-10 VITALS — BP 158/70 | HR 109 | Temp 98.0°F | Ht 64.0 in | Wt 185.0 lb

## 2020-04-10 DIAGNOSIS — R634 Abnormal weight loss: Secondary | ICD-10-CM

## 2020-04-10 DIAGNOSIS — E1159 Type 2 diabetes mellitus with other circulatory complications: Secondary | ICD-10-CM | POA: Diagnosis not present

## 2020-04-10 DIAGNOSIS — T07XXXA Unspecified multiple injuries, initial encounter: Secondary | ICD-10-CM | POA: Diagnosis not present

## 2020-04-10 DIAGNOSIS — G8929 Other chronic pain: Secondary | ICD-10-CM

## 2020-04-10 DIAGNOSIS — M5440 Lumbago with sciatica, unspecified side: Secondary | ICD-10-CM | POA: Diagnosis not present

## 2020-04-10 DIAGNOSIS — A419 Sepsis, unspecified organism: Secondary | ICD-10-CM | POA: Diagnosis not present

## 2020-04-10 MED ORDER — HYDROCODONE-ACETAMINOPHEN 7.5-325 MG PO TABS: 0.5000 | ORAL_TABLET | Freq: Four times a day (QID) | ORAL | 0 refills | Status: DC | PRN

## 2020-04-10 NOTE — Assessment & Plan Note (Signed)
Wt Readings from Last 3 Encounters:  04/10/20 185 lb (83.9 kg)  03/06/20 190 lb 6.4 oz (86.4 kg)  01/10/20 188 lb (85.3 kg)

## 2020-04-10 NOTE — Assessment & Plan Note (Signed)
Chronic. 

## 2020-04-10 NOTE — Assessment & Plan Note (Signed)
Perineal/groin wounds. There are 3 openings that need to be packed. Lightly dampen kerlex with saline and pack to base of wound, cover with dry gauze or ABD pad and secure with tape

## 2020-04-10 NOTE — Telephone Encounter (Signed)
Called Marcene Brawn there was no answer LMOM w/MD response.Marland KitchenJohny Chess

## 2020-04-10 NOTE — Assessment & Plan Note (Signed)
Sepsis(POA)due to right perineal abscess due toE faecalis -Status post I&D on 9/2 -Patient is status post Vanco and Zosyn and has been changed to p.o. amoxicillin to finish course

## 2020-04-10 NOTE — Progress Notes (Signed)
Subjective:  Patient ID: [name], female    DOB: 1950/08/15  Age: 69 y.o. MRN: [id number]  CC: Hospitalization Follow-up   HPI [name] presents for wounds, pain, sepsis f/u Was in a NH x 1 month   Admit date: 03/05/2020 Discharge date: 03/14/2020  Admitted From: home Discharge disposition: snf    Recommendations for Outpatient Follow-Up:   1. Continue to monitor blood sugars and adjust insulin as needed 2. abx through: 9/12   Discharge Diagnosis:   Principal Problem:   Cellulitis Active Problems:   Controlled type 2 diabetes mellitus with circulatory disorder, without long-term current use of insulin ([place])   Hypertension associated with diabetes ([place])   Chronic combined systolic and diastolic CHF (congestive heart failure) ([facility])   CKD stage 4 due to type 2 diabetes mellitus ([place])   Hyperglycemia   Acute lower UTI   DKA, type 2 ([place])   Sepsis ([place])    Discharge Condition: Improved.  Diet recommendation: Low sodium, heart healthy.  Carbohydrate-modified.   Wound care: BID wet to dry dressing changes to perineal/groin wounds. There are 3 openings that need to be packed. Lightly dampen kerlex with saline and pack to base of wound, cover with dry gauze or ABD pad and secure with tape  Code status: Full.   History of Present Illness:   [name] Guardado Fergusonis a 69 y.o.femalewith medical history significant ofDM2, CKD 4 due to DM, HTN, ICM.  Pt in to ED with EMS after laying on floor all night from midnight to 1pm today following a fall at home. Pt states she is in so much pain from multiple "abscesses in her groin area" that she fell. Pt hasnt taken any meds in a week it seems. CBG reading high.   Hospital Course by Problem:   Sepsis(POA)due to right perineal abscess due toE faecalis -Status post I&D on 9/2 -General surgery consultation  appreciated -Patient is status post Vanco and Zosyn and has been  changed to p.o. amoxicillin to finish course -Patient is medically stable for discharge and has been for 48 hours  Type 2 diabetes uncontrolled with hyperglycemia -A1c was 7.2 -Sliding scale insulin, Lantus, NovoLog with meals  E faecalis UTI -Continue with amoxicillin as notated above  Anemia of chronic kidney disease -Status post 1 unit of PRBCs on 9/7 -Continue to monitor  Chronic kidney disease stage IV -CR stable  Hypertension -Continue amlodipine -have held other home BP Meds -resume as needed  Morbid obesity Body mass index is 32.68 kg/m.  Nutrition Status: Nutrition Problem: Increased nutrient needs Etiology: post-op healing Signs/Symptoms: estimated needs Interventions: MVI, Premier Protein, Juven    Medical Consultants:   General surgery   Outpatient Medications Prior to Visit  Medication Sig Dispense Refill  . acidophilus (RISAQUAD) CAPS capsule Take 1 capsule by mouth daily.    [name] amLODipine (NORVASC) 10 MG tablet TAKE ONE-HALF TABLET BY  MOUTH DAILY (Patient taking differently: Take 5 mg by mouth daily. ) 45 tablet 3  . amoxicillin (AMOXIL) 500 MG capsule Take 1 capsule (500 mg total) by mouth every 8 (eight) hours.    [name] aspirin 81 MG chewable tablet Chew 1 tablet (81 mg total) by mouth daily.    . calcium carbonate (OS-CAL) 600 MG TABS tablet Take 600 mg by mouth 2 (two) times daily with a meal.    . Cholecalciferol (VITAMIN D3) 50 MCG (2000 UT) capsule Take 1 capsule (2,000 Units total) by mouth daily. 100 capsule 3  .  glucose blood (ONETOUCH ULTRA) test strip USE TO TEST BLOOD SUGAR TWO TIMES A DAY AS DIRECTED 200 each 3  . hydrOXYzine (ATARAX/VISTARIL) 25 MG tablet Take 1-2 tablets (25-50 mg total) by mouth every 8 (eight) hours as needed for itching. 60 tablet 0  . insulin aspart (NOVOLOG) 100 UNIT/ML injection Inject 0-15 Units into the skin 3 (three) times daily with meals. 10 mL 11  . insulin aspart (NOVOLOG) 100 UNIT/ML injection  Inject 0-5 Units into the skin at bedtime. 10 mL 11  . insulin aspart (NOVOLOG) 100 UNIT/ML injection Inject 8 Units into the skin 3 (three) times daily with meals. 10 mL 11  . insulin glargine (LANTUS) 100 UNIT/ML injection Inject 0.12 mLs (12 Units total) into the skin 2 (two) times daily. 10 mL 11  . lansoprazole (PREVACID) 15 MG capsule Take 1 capsule (15 mg total) by mouth daily at 12 noon. (Patient taking differently: Take 15 mg by mouth daily as needed (acid reflux). ) 90 capsule 3  . ondansetron (ZOFRAN) 4 MG tablet Take 1 tablet (4 mg total) by mouth every 8 (eight) hours as needed for nausea or vomiting. 20 tablet 0  . potassium chloride SA (KLOR-CON) 20 MEQ tablet Take 1 tablet (20 mEq total) by mouth daily as needed (cramping).    . rosuvastatin (CRESTOR) 5 MG tablet TAKE 1 TABLET BY MOUTH  DAILY AT 6PM (Patient taking differently: Take 5 mg by mouth daily. ) 90 tablet 3  . torsemide (DEMADEX) 20 MG tablet Take 1 tablet (20 mg total) by mouth daily.    . vitamin B-12 (CYANOCOBALAMIN) 1000 MCG tablet Take 1,000 mcg by mouth daily.    [name] ketorolac (ACULAR) 0.5 % ophthalmic solution 1 drop 4 (four) times daily.     No facility-administered medications prior to visit.    ROS: Review of Systems  Objective:  BP (!) 158/70 (BP Location: Right Arm, Patient Position: Sitting, Cuff Size: Normal)   Pulse (!) 109   Temp 98 F (36.7 C) (Oral)   Ht 5\' 4"  (1.626 m)   Wt 185 lb (83.9 kg)   SpO2 99%   BMI 31.76 kg/m   BP Readings from Last 3 Encounters:  04/10/20 (!) 158/70  03/14/20 114/81  01/10/20 138/70    Wt Readings from Last 3 Encounters:  04/10/20 185 lb (83.9 kg)  03/06/20 190 lb 6.4 oz (86.4 kg)  01/10/20 188 lb (85.3 kg)    Physical Exam  Lab Results  Component Value Date   WBC 13.3 (H) 03/13/2020   HGB 8.7 (L) 03/13/2020   HCT 27.8 (L) 03/13/2020   PLT 242 03/13/2020   GLUCOSE 192 (H) 03/13/2020   CHOL 119 01/10/2020   TRIG 73.0 01/10/2020   HDL 48.50  01/10/2020   LDLCALC 56 01/10/2020   ALT 12 01/10/2019   AST 12 01/10/2019   NA 137 03/13/2020   K 3.6 03/13/2020   CL 106 03/13/2020   CREATININE 1.54 (H) 03/13/2020   BUN 25 (H) 03/13/2020   CO2 21 (L) 03/13/2020   TSH 2.17 01/10/2020   INR 1.16 06/07/2013   HGBA1C 7.2 (A) 01/10/2020   MICROALBUR 13.9 (H) 10/13/2007    CT ABDOMEN PELVIS WO CONTRAST  Addendum Date: 03/06/2020   ADDENDUM REPORT: 03/06/2020 21:05 ADDENDUM: Critical Value/emergent results were called by telephone at the time of interpretation on 03/06/2020 at 9:05 pm to provider Sharlet Salina NP , who verbally acknowledged these results. Electronically Signed   By: Elwin Sleight.D.  On: 03/06/2020 21:05   Result Date: 03/06/2020 CLINICAL DATA:  Abdominal pain, abdominal and groin cellulitis EXAM: CT ABDOMEN AND PELVIS WITHOUT CONTRAST TECHNIQUE: Multidetector CT imaging of the abdomen and pelvis was performed following the standard protocol without IV contrast. COMPARISON:  None. FINDINGS: Lower chest: Lung bases are clear. Cardiac size at the upper limits normal. Coronary artery calcifications are present. Suspect calcification of the mitral annulus as well. Hypoattenuation of the cardiac blood pool may reflect a mild anemia. Hepatobiliary: No visible concerning liver lesion within the limitations of this unenhanced of motion degraded CT. Smooth liver surface contour. Normal liver attenuation. Gallbladder is unremarkable. Question some biliary ductal dilatation difficult to fully discern given the lack of contrast media and extensive motion artifact. The extrahepatic common bile duct may measure up to 1.5 cm at the level of the pancreatic head. No visible intraductal gallstones. Pancreas: Poorly assessed given extensive motion artifact photon starvation. No pancreatic ductal dilatation or surrounding inflammatory changes. Spleen: Normal in size. No concerning splenic lesions. Splenic hilar vascular calcifications are noted. Adrenals/Urinary  Tract: Adrenal glands are unremarkable. Kidneys are symmetric in size and normally located. Question some mild perinephric stranding versus volume averaging in the setting of motion artifact. No visible concerning renal lesion. No urolithiasis or hydronephrosis. Urinary bladder is unremarkable. Stomach/Bowel: Small sliding-type hiatal hernia. Distal stomach and duodenum are unremarkable. No small or large bowel thickening or dilatation. No evidence of bowel obstruction. Appendix is not visualized. No focal inflammation the vicinity of the cecum to suggest an occult appendicitis. Vascular/Lymphatic: Atherosclerotic calcifications within the abdominal aorta and branch vessels. No aneurysm or ectasia. No enlarged abdominopelvic lymph nodes. Likely reactive adenopathy seen in the inguinal regions. Reproductive: Anteverted uterus. Question endometrial thickening, greater than expected for patient age measuring up to 10 mm in struck thickness. No concerning adnexal lesions. Few parametrial calcifications are atypical senescent finding Other: There is extensive soft tissue stranding and thickening extending across the low anterior abdominal wall/panniculus. Large lobular phlegmon in the right groin/pudendal tissues superficial to the right adductor musculature and right inferior pubic ramus anteriorly with small amount of soft tissue gas. Is region is difficult to accurately measure given the lobular margins of med does extend up to 4.3 x 6.2 cm in maximal transaxial dimensions. Organized collection/abscess formation is difficult to ascertain in the absence of contrast media. Musculoskeletal: Soft tissue thickening and edematous changes of the adductor compartment of the right thigh could reflect some reactive or inflammatory myositis. No discernible intramuscular collection is seen. No acute or suspicious osseous lesions. Multilevel degenerative changes in the spine, hips and pelvis. Mild grade 1 anterolisthesis L4 on 5.  No spondylolysis is evident. IMPRESSION: 1. Extensive soft tissue stranding and thickening extending across the low anterior abdominal wall/panniculus with a large, heterogeneous phlegmon within the deep space of the right groin and pudendal soft tissues along the right adductor musculature and right inferior pubic ramus with small amount of soft tissue gas. Organized collection/abscess formation is difficult to ascertain in the absence of contrast media. Findings are concerning for severe cellulitis and aggressive soft tissue infection. Emergent surgical consultation is warranted. 2. Soft tissue thickening and edematous changes of the adductor compartment of the right thigh could reflect some reactive or inflammatory myositis. No discernible intramuscular collection is seen. 3. Question endometrial thickening, greater than expected for patient age measuring up to 10 mm in thickness. Recommend further evaluation with outpatient pelvic ultrasound when patient is able to better tolerate. 4. Aortic Atherosclerosis (ICD10-I70.0). Currently  attempting to contact the ordering provider with a critical value result. Addendum will be submitted upon case discussion. Electronically Signed: By: Lovena Le M.D. On: 03/06/2020 21:02   CT HEAD WO CONTRAST  Result Date: 03/06/2020 CLINICAL DATA:  Mental status change and cause EXAM: CT HEAD WITHOUT CONTRAST TECHNIQUE: Contiguous axial images were obtained from the base of the skull through the vertex without intravenous contrast. COMPARISON:  CT head 06/06/2013 FINDINGS: Brain: No evidence of acute infarction, hemorrhage, hydrocephalus, extra-axial collection or mass lesion/mass effect. Symmetric prominence of the ventricles, cisterns and sulci compatible with parenchymal volume loss. Patchy areas of white matter hypoattenuation are most compatible with chronic microvascular angiopathy. Vascular: Atherosclerotic calcification of the carotid siphons0 and left intradural  vertebral artery. No worrisome hyperdense vessel. Skull: No scalp swelling or calvarial fracture. No acute or suspicious osseous lesions Sinuses/Orbits: Minimal thickening in the ethmoid air cells. Remaining paranasal sinuses and slightly hypo pneumatized mastoid air cells are predominantly clear. Included orbital structures are unremarkable. Other: Debris noted in the left external auditory canal. IMPRESSION: 1. No acute intracranial findings. 2. Mild parenchymal volume loss and chronic microvascular angiopathy. 3. Debris in the left external auditory canal, correlate for cerumen impaction. Electronically Signed   By: Lovena Le M.D.   On: 03/06/2020 20:46   DG Chest Port 1 View  Result Date: 03/06/2020 CLINICAL DATA:  Diabetic ketoacidosis EXAM: PORTABLE CHEST 1 VIEW COMPARISON:  04/13/2016 FINDINGS: Minimal left basilar opacity. No pleural effusion. Stable cardiomediastinal silhouette with aortic atherosclerosis. No pneumothorax. IMPRESSION: Minimal left basilar opacity, atelectasis versus mild pneumonia. Electronically Signed   By: Donavan Foil M.D.   On: 03/06/2020 17:28    Assessment & Plan:    Walker Kehr, MD

## 2020-04-11 DIAGNOSIS — D509 Iron deficiency anemia, unspecified: Secondary | ICD-10-CM | POA: Diagnosis not present

## 2020-04-11 DIAGNOSIS — K219 Gastro-esophageal reflux disease without esophagitis: Secondary | ICD-10-CM | POA: Diagnosis not present

## 2020-04-11 DIAGNOSIS — N390 Urinary tract infection, site not specified: Secondary | ICD-10-CM | POA: Diagnosis not present

## 2020-04-11 DIAGNOSIS — N184 Chronic kidney disease, stage 4 (severe): Secondary | ICD-10-CM | POA: Diagnosis not present

## 2020-04-11 DIAGNOSIS — L02214 Cutaneous abscess of groin: Secondary | ICD-10-CM | POA: Diagnosis not present

## 2020-04-11 DIAGNOSIS — L03314 Cellulitis of groin: Secondary | ICD-10-CM | POA: Diagnosis not present

## 2020-04-11 DIAGNOSIS — I252 Old myocardial infarction: Secondary | ICD-10-CM | POA: Diagnosis not present

## 2020-04-11 DIAGNOSIS — D631 Anemia in chronic kidney disease: Secondary | ICD-10-CM | POA: Diagnosis not present

## 2020-04-11 DIAGNOSIS — I255 Ischemic cardiomyopathy: Secondary | ICD-10-CM | POA: Diagnosis not present

## 2020-04-11 DIAGNOSIS — I2510 Atherosclerotic heart disease of native coronary artery without angina pectoris: Secondary | ICD-10-CM | POA: Diagnosis not present

## 2020-04-11 DIAGNOSIS — I5040 Unspecified combined systolic (congestive) and diastolic (congestive) heart failure: Secondary | ICD-10-CM | POA: Diagnosis not present

## 2020-04-11 DIAGNOSIS — E1122 Type 2 diabetes mellitus with diabetic chronic kidney disease: Secondary | ICD-10-CM | POA: Diagnosis not present

## 2020-04-11 DIAGNOSIS — F17210 Nicotine dependence, cigarettes, uncomplicated: Secondary | ICD-10-CM | POA: Diagnosis not present

## 2020-04-11 DIAGNOSIS — E785 Hyperlipidemia, unspecified: Secondary | ICD-10-CM | POA: Diagnosis not present

## 2020-04-11 DIAGNOSIS — Z85038 Personal history of other malignant neoplasm of large intestine: Secondary | ICD-10-CM | POA: Diagnosis not present

## 2020-04-11 DIAGNOSIS — B952 Enterococcus as the cause of diseases classified elsewhere: Secondary | ICD-10-CM | POA: Diagnosis not present

## 2020-04-11 DIAGNOSIS — I130 Hypertensive heart and chronic kidney disease with heart failure and stage 1 through stage 4 chronic kidney disease, or unspecified chronic kidney disease: Secondary | ICD-10-CM | POA: Diagnosis not present

## 2020-04-11 DIAGNOSIS — J449 Chronic obstructive pulmonary disease, unspecified: Secondary | ICD-10-CM | POA: Diagnosis not present

## 2020-04-11 DIAGNOSIS — Z7982 Long term (current) use of aspirin: Secondary | ICD-10-CM | POA: Diagnosis not present

## 2020-04-11 DIAGNOSIS — Z8673 Personal history of transient ischemic attack (TIA), and cerebral infarction without residual deficits: Secondary | ICD-10-CM | POA: Diagnosis not present

## 2020-04-11 MED ORDER — HYDROCODONE-ACETAMINOPHEN 7.5-325 MG PO TABS: 0.5000 | ORAL_TABLET | Freq: Four times a day (QID) | ORAL | 0 refills | Status: DC | PRN

## 2020-04-11 NOTE — Telephone Encounter (Signed)
Cathie Olden at [facility]  Was calling for an update on Ver[place]als that were requested earlier this week.  Please call [phone]

## 2020-04-15 ENCOUNTER — Telehealth: Payer: Self-pay

## 2020-04-15 ENCOUNTER — Encounter: Payer: Self-pay | Admitting: Internal Medicine

## 2020-04-15 ENCOUNTER — Ambulatory Visit (INDEPENDENT_AMBULATORY_CARE_PROVIDER_SITE_OTHER): Payer: Medicare Other | Admitting: Internal Medicine

## 2020-04-15 ENCOUNTER — Other Ambulatory Visit: Payer: Self-pay

## 2020-04-15 VITALS — BP 136/78 | HR 100 | Temp 99.0°F | Ht 62.0 in | Wt 183.8 lb

## 2020-04-15 DIAGNOSIS — S31109A Unspecified open wound of abdominal wall, unspecified quadrant without penetration into peritoneal cavity, initial encounter: Secondary | ICD-10-CM | POA: Diagnosis not present

## 2020-04-15 NOTE — Progress Notes (Signed)
   Subjective:     Patient ID: [name], female    DOB: 1951/05/21, 69 y.o.   MRN: [id number]  Chief Complaint  Patient presents with  . Consult    HPI: The patient is a 69 y.o. female with history of type 2 diabetes on oral agents, chronic kidney disease, and hypertension that presents for groin and perineum wounds.  Patient was admitted on 8/31 for cellulitis.  During admission she had an I&D of multiple perineum abscesses and was discharged with oral antibiotics for which she states she finished at home.  She is currently doing twice daily wet-to-dry dressing changes and a wound care nurse visits two times a week to pack the wounds.    She reports serous drainage.  She has moderate dull pain that improves with pain medicine prescribed by her primary care physician for this issue.  She denies acute pain, purulent drainage or increased warmth or erythema to the wound.  Review of Systems  All other systems reviewed and are negative.    has a past medical history of Anemia, COPD (chronic obstructive pulmonary disease) ([place]), Diabetes mellitus, Hyperlipidemia, Hypertension, Ischemic cardiomyopathy (06/11/2013), Low back pain, NSTEMI (non-ST elevated myocardial infarction) ([place]) (06/11/2013), Obesity, Personal history of colon cancer, and Renal insufficiency.  has a past surgical history that includes Colectomy; left heart catheterization with coronary angiogram (N/A, 06/09/2013); and Incision and drainage perirectal abscess (N/A, 03/07/2020).  reports that she has been smoking cigarettes. She has never used smokeless tobacco. Objective:   Vital Signs BP 136/78 (BP Location: Left Arm, Patient Position: Sitting, Cuff Size: Large)   Pulse 100   Temp 99 F (37.2 C) (Oral)   Ht 5\' 2"  (1.575 m)   Wt 183 lb 12.8 oz (83.4 kg)   SpO2 99%   BMI 33.62 kg/m  Vital Signs and Nursing Note Reviewed Physical Exam Skin:    Comments: Right anterior wound measures: 4.5 x 1.0 x 0.4 cm,  epithelialization throughout the wound  Right inner groin wound (most Caudad) measures: 6.0 x 3.5 x 0.3 cm.  There is tunneling for approximately 3 cm.  Granulation tissue throughout the wound bed.  Right inner groin wound (most cephalad) measures: 4.0 x 1.5 x 0.2 cm.  Tunneling noted approximately 2 cm.  Granulation tissue throughout the wound bed  No induration, increased erythema or warmth, or purulent drainage noted         Assessment/Plan:     ICD-10-CM   1. Right groin wound, initial encounter  S31.109A    Assessment: Multiple wounds, located to anterior thigh, right groin and  perineum s/p incision and drainage on 9/2 due to abscess formation .  Patient was admitted on 8/31 for severe cellulitis and aggressive soft tissue infection of the abdomen and groin noted on CT abdomen pelvis.  She was treated with IV antibiotics and had 3 I&D's by general surgery.  Today the wounds appear well-healing.  The anterior thigh wound appears healed and I recommended using Vaseline to this area.  The other 2 wounds on the right groin have tunneling and I recommended continuing to pack these.  I recommended doing this 3 times weekly.  On the granulation tissue  I recommended using silver alginate as she is having moderate drainage.  We will discuss new orders with wound care nurse.  Plan -Pack wounds 3 times weekly -Silver alginate daily and cover with ABD -Follow-up in 3 weeks  Boyd Kerbs, DO 04/15/2020, 11:36 AM

## 2020-04-15 NOTE — Telephone Encounter (Signed)
Patient called to give Kirtland Bouchard, CMA, the phone number for [facility]...[phone].

## 2020-04-15 NOTE — Patient Instructions (Signed)
[name] It was a pleasure meeting you today.  Please follow the instructions below for your wound care.  Please continue to do the current wound care with wet-to-dry dressing changes twice daily and packing twice a week.  We will reach out to the wound care nurse to change orders.  The new orders will be packing every 2 days and silver alginate dressing to the open wound area daily.  Please follow up with me in 3 weeks.  Call us at [phone] with any questions or concerns

## 2020-04-16 DIAGNOSIS — F17210 Nicotine dependence, cigarettes, uncomplicated: Secondary | ICD-10-CM | POA: Diagnosis not present

## 2020-04-16 DIAGNOSIS — L02214 Cutaneous abscess of groin: Secondary | ICD-10-CM | POA: Diagnosis not present

## 2020-04-16 DIAGNOSIS — K219 Gastro-esophageal reflux disease without esophagitis: Secondary | ICD-10-CM | POA: Diagnosis not present

## 2020-04-16 DIAGNOSIS — L03314 Cellulitis of groin: Secondary | ICD-10-CM | POA: Diagnosis not present

## 2020-04-16 DIAGNOSIS — N390 Urinary tract infection, site not specified: Secondary | ICD-10-CM | POA: Diagnosis not present

## 2020-04-16 DIAGNOSIS — B952 Enterococcus as the cause of diseases classified elsewhere: Secondary | ICD-10-CM | POA: Diagnosis not present

## 2020-04-16 DIAGNOSIS — J449 Chronic obstructive pulmonary disease, unspecified: Secondary | ICD-10-CM | POA: Diagnosis not present

## 2020-04-16 DIAGNOSIS — N184 Chronic kidney disease, stage 4 (severe): Secondary | ICD-10-CM | POA: Diagnosis not present

## 2020-04-16 DIAGNOSIS — E1122 Type 2 diabetes mellitus with diabetic chronic kidney disease: Secondary | ICD-10-CM | POA: Diagnosis not present

## 2020-04-16 DIAGNOSIS — D631 Anemia in chronic kidney disease: Secondary | ICD-10-CM | POA: Diagnosis not present

## 2020-04-16 DIAGNOSIS — I130 Hypertensive heart and chronic kidney disease with heart failure and stage 1 through stage 4 chronic kidney disease, or unspecified chronic kidney disease: Secondary | ICD-10-CM | POA: Diagnosis not present

## 2020-04-16 DIAGNOSIS — I252 Old myocardial infarction: Secondary | ICD-10-CM | POA: Diagnosis not present

## 2020-04-16 DIAGNOSIS — Z8673 Personal history of transient ischemic attack (TIA), and cerebral infarction without residual deficits: Secondary | ICD-10-CM | POA: Diagnosis not present

## 2020-04-16 DIAGNOSIS — I2510 Atherosclerotic heart disease of native coronary artery without angina pectoris: Secondary | ICD-10-CM | POA: Diagnosis not present

## 2020-04-16 DIAGNOSIS — Z85038 Personal history of other malignant neoplasm of large intestine: Secondary | ICD-10-CM | POA: Diagnosis not present

## 2020-04-16 DIAGNOSIS — D509 Iron deficiency anemia, unspecified: Secondary | ICD-10-CM | POA: Diagnosis not present

## 2020-04-16 DIAGNOSIS — I255 Ischemic cardiomyopathy: Secondary | ICD-10-CM | POA: Diagnosis not present

## 2020-04-16 DIAGNOSIS — I5040 Unspecified combined systolic (congestive) and diastolic (congestive) heart failure: Secondary | ICD-10-CM | POA: Diagnosis not present

## 2020-04-16 DIAGNOSIS — Z7982 Long term (current) use of aspirin: Secondary | ICD-10-CM | POA: Diagnosis not present

## 2020-04-16 DIAGNOSIS — E785 Hyperlipidemia, unspecified: Secondary | ICD-10-CM | POA: Diagnosis not present

## 2020-04-16 NOTE — Telephone Encounter (Signed)
Cathie Olden at [facility] called for an update on verbal orders that were requested last week.  Please call [phone]

## 2020-04-16 NOTE — Telephone Encounter (Signed)
Ok for verbals 

## 2020-04-17 NOTE — Telephone Encounter (Signed)
Spoke with Anda Kraft and was able to give her the verbal OK for Home Health PT once weekly for five weeks per Dr. [name] as Dr. Alain Marion is out of the office.

## 2020-04-19 ENCOUNTER — Telehealth: Payer: Self-pay

## 2020-04-19 DIAGNOSIS — Z8673 Personal history of transient ischemic attack (TIA), and cerebral infarction without residual deficits: Secondary | ICD-10-CM | POA: Diagnosis not present

## 2020-04-19 DIAGNOSIS — I252 Old myocardial infarction: Secondary | ICD-10-CM | POA: Diagnosis not present

## 2020-04-19 DIAGNOSIS — N390 Urinary tract infection, site not specified: Secondary | ICD-10-CM | POA: Diagnosis not present

## 2020-04-19 DIAGNOSIS — I255 Ischemic cardiomyopathy: Secondary | ICD-10-CM | POA: Diagnosis not present

## 2020-04-19 DIAGNOSIS — L03314 Cellulitis of groin: Secondary | ICD-10-CM | POA: Diagnosis not present

## 2020-04-19 DIAGNOSIS — Z85038 Personal history of other malignant neoplasm of large intestine: Secondary | ICD-10-CM | POA: Diagnosis not present

## 2020-04-19 DIAGNOSIS — I2510 Atherosclerotic heart disease of native coronary artery without angina pectoris: Secondary | ICD-10-CM | POA: Diagnosis not present

## 2020-04-19 DIAGNOSIS — D509 Iron deficiency anemia, unspecified: Secondary | ICD-10-CM | POA: Diagnosis not present

## 2020-04-19 DIAGNOSIS — K219 Gastro-esophageal reflux disease without esophagitis: Secondary | ICD-10-CM | POA: Diagnosis not present

## 2020-04-19 DIAGNOSIS — E785 Hyperlipidemia, unspecified: Secondary | ICD-10-CM | POA: Diagnosis not present

## 2020-04-19 DIAGNOSIS — B952 Enterococcus as the cause of diseases classified elsewhere: Secondary | ICD-10-CM | POA: Diagnosis not present

## 2020-04-19 DIAGNOSIS — I130 Hypertensive heart and chronic kidney disease with heart failure and stage 1 through stage 4 chronic kidney disease, or unspecified chronic kidney disease: Secondary | ICD-10-CM | POA: Diagnosis not present

## 2020-04-19 DIAGNOSIS — I5040 Unspecified combined systolic (congestive) and diastolic (congestive) heart failure: Secondary | ICD-10-CM | POA: Diagnosis not present

## 2020-04-19 DIAGNOSIS — N184 Chronic kidney disease, stage 4 (severe): Secondary | ICD-10-CM | POA: Diagnosis not present

## 2020-04-19 DIAGNOSIS — D631 Anemia in chronic kidney disease: Secondary | ICD-10-CM | POA: Diagnosis not present

## 2020-04-19 DIAGNOSIS — L02214 Cutaneous abscess of groin: Secondary | ICD-10-CM | POA: Diagnosis not present

## 2020-04-19 DIAGNOSIS — J449 Chronic obstructive pulmonary disease, unspecified: Secondary | ICD-10-CM | POA: Diagnosis not present

## 2020-04-19 DIAGNOSIS — Z7982 Long term (current) use of aspirin: Secondary | ICD-10-CM | POA: Diagnosis not present

## 2020-04-19 DIAGNOSIS — E1122 Type 2 diabetes mellitus with diabetic chronic kidney disease: Secondary | ICD-10-CM | POA: Diagnosis not present

## 2020-04-19 DIAGNOSIS — F17210 Nicotine dependence, cigarettes, uncomplicated: Secondary | ICD-10-CM | POA: Diagnosis not present

## 2020-04-19 NOTE — Telephone Encounter (Signed)
Called on (04/16/20) and spoke with [place] with [facility] and gave her verbal wound care orders for the patient.  Stephanie,RN verbalized understanding and agreed.   On (04/17/20) faxed wound care orders to [employer] with [facility].  Copy scanned into the chart.//AB/CMA

## 2020-04-19 NOTE — Telephone Encounter (Signed)
Faxed signed order by Dr. Marla Roe to [facility] for wound care

## 2020-04-22 DIAGNOSIS — I255 Ischemic cardiomyopathy: Secondary | ICD-10-CM | POA: Diagnosis not present

## 2020-04-22 DIAGNOSIS — Z7982 Long term (current) use of aspirin: Secondary | ICD-10-CM | POA: Diagnosis not present

## 2020-04-22 DIAGNOSIS — K219 Gastro-esophageal reflux disease without esophagitis: Secondary | ICD-10-CM | POA: Diagnosis not present

## 2020-04-22 DIAGNOSIS — N184 Chronic kidney disease, stage 4 (severe): Secondary | ICD-10-CM | POA: Diagnosis not present

## 2020-04-22 DIAGNOSIS — J449 Chronic obstructive pulmonary disease, unspecified: Secondary | ICD-10-CM | POA: Diagnosis not present

## 2020-04-22 DIAGNOSIS — D631 Anemia in chronic kidney disease: Secondary | ICD-10-CM | POA: Diagnosis not present

## 2020-04-22 DIAGNOSIS — F17210 Nicotine dependence, cigarettes, uncomplicated: Secondary | ICD-10-CM | POA: Diagnosis not present

## 2020-04-22 DIAGNOSIS — B952 Enterococcus as the cause of diseases classified elsewhere: Secondary | ICD-10-CM | POA: Diagnosis not present

## 2020-04-22 DIAGNOSIS — L03314 Cellulitis of groin: Secondary | ICD-10-CM | POA: Diagnosis not present

## 2020-04-22 DIAGNOSIS — I252 Old myocardial infarction: Secondary | ICD-10-CM | POA: Diagnosis not present

## 2020-04-22 DIAGNOSIS — I2510 Atherosclerotic heart disease of native coronary artery without angina pectoris: Secondary | ICD-10-CM | POA: Diagnosis not present

## 2020-04-22 DIAGNOSIS — E785 Hyperlipidemia, unspecified: Secondary | ICD-10-CM | POA: Diagnosis not present

## 2020-04-22 DIAGNOSIS — E1122 Type 2 diabetes mellitus with diabetic chronic kidney disease: Secondary | ICD-10-CM | POA: Diagnosis not present

## 2020-04-22 DIAGNOSIS — D509 Iron deficiency anemia, unspecified: Secondary | ICD-10-CM | POA: Diagnosis not present

## 2020-04-22 DIAGNOSIS — L02214 Cutaneous abscess of groin: Secondary | ICD-10-CM | POA: Diagnosis not present

## 2020-04-22 DIAGNOSIS — I5040 Unspecified combined systolic (congestive) and diastolic (congestive) heart failure: Secondary | ICD-10-CM | POA: Diagnosis not present

## 2020-04-22 DIAGNOSIS — Z85038 Personal history of other malignant neoplasm of large intestine: Secondary | ICD-10-CM | POA: Diagnosis not present

## 2020-04-22 DIAGNOSIS — N390 Urinary tract infection, site not specified: Secondary | ICD-10-CM | POA: Diagnosis not present

## 2020-04-22 DIAGNOSIS — I130 Hypertensive heart and chronic kidney disease with heart failure and stage 1 through stage 4 chronic kidney disease, or unspecified chronic kidney disease: Secondary | ICD-10-CM | POA: Diagnosis not present

## 2020-04-22 DIAGNOSIS — Z8673 Personal history of transient ischemic attack (TIA), and cerebral infarction without residual deficits: Secondary | ICD-10-CM | POA: Diagnosis not present

## 2020-04-24 ENCOUNTER — Telehealth: Payer: Self-pay

## 2020-04-24 DIAGNOSIS — N390 Urinary tract infection, site not specified: Secondary | ICD-10-CM | POA: Diagnosis not present

## 2020-04-24 DIAGNOSIS — J449 Chronic obstructive pulmonary disease, unspecified: Secondary | ICD-10-CM | POA: Diagnosis not present

## 2020-04-24 DIAGNOSIS — L02214 Cutaneous abscess of groin: Secondary | ICD-10-CM | POA: Diagnosis not present

## 2020-04-24 DIAGNOSIS — F17210 Nicotine dependence, cigarettes, uncomplicated: Secondary | ICD-10-CM | POA: Diagnosis not present

## 2020-04-24 DIAGNOSIS — E1122 Type 2 diabetes mellitus with diabetic chronic kidney disease: Secondary | ICD-10-CM | POA: Diagnosis not present

## 2020-04-24 DIAGNOSIS — D631 Anemia in chronic kidney disease: Secondary | ICD-10-CM | POA: Diagnosis not present

## 2020-04-24 DIAGNOSIS — Z7982 Long term (current) use of aspirin: Secondary | ICD-10-CM | POA: Diagnosis not present

## 2020-04-24 DIAGNOSIS — L03314 Cellulitis of groin: Secondary | ICD-10-CM | POA: Diagnosis not present

## 2020-04-24 DIAGNOSIS — I130 Hypertensive heart and chronic kidney disease with heart failure and stage 1 through stage 4 chronic kidney disease, or unspecified chronic kidney disease: Secondary | ICD-10-CM | POA: Diagnosis not present

## 2020-04-24 DIAGNOSIS — Z85038 Personal history of other malignant neoplasm of large intestine: Secondary | ICD-10-CM | POA: Diagnosis not present

## 2020-04-24 DIAGNOSIS — I2510 Atherosclerotic heart disease of native coronary artery without angina pectoris: Secondary | ICD-10-CM | POA: Diagnosis not present

## 2020-04-24 DIAGNOSIS — I252 Old myocardial infarction: Secondary | ICD-10-CM | POA: Diagnosis not present

## 2020-04-24 DIAGNOSIS — Z8673 Personal history of transient ischemic attack (TIA), and cerebral infarction without residual deficits: Secondary | ICD-10-CM | POA: Diagnosis not present

## 2020-04-24 DIAGNOSIS — D509 Iron deficiency anemia, unspecified: Secondary | ICD-10-CM | POA: Diagnosis not present

## 2020-04-24 DIAGNOSIS — N184 Chronic kidney disease, stage 4 (severe): Secondary | ICD-10-CM | POA: Diagnosis not present

## 2020-04-24 DIAGNOSIS — E785 Hyperlipidemia, unspecified: Secondary | ICD-10-CM | POA: Diagnosis not present

## 2020-04-24 DIAGNOSIS — K219 Gastro-esophageal reflux disease without esophagitis: Secondary | ICD-10-CM | POA: Diagnosis not present

## 2020-04-24 DIAGNOSIS — B952 Enterococcus as the cause of diseases classified elsewhere: Secondary | ICD-10-CM | POA: Diagnosis not present

## 2020-04-24 DIAGNOSIS — I5040 Unspecified combined systolic (congestive) and diastolic (congestive) heart failure: Secondary | ICD-10-CM | POA: Diagnosis not present

## 2020-04-24 DIAGNOSIS — I255 Ischemic cardiomyopathy: Secondary | ICD-10-CM | POA: Diagnosis not present

## 2020-04-24 NOTE — Telephone Encounter (Signed)
Received call from [facility] from [facility]. She saw the patient today and states the outside of the wound is peeling rapidly. She recommends going back to Iodoform packing instead of the silver alginate so it will stay in better. She can be contacted at: [phone] and a voicemail can be left. Fax: [phone].

## 2020-04-26 ENCOUNTER — Telehealth: Payer: Self-pay | Admitting: Internal Medicine

## 2020-04-26 DIAGNOSIS — E1122 Type 2 diabetes mellitus with diabetic chronic kidney disease: Secondary | ICD-10-CM | POA: Diagnosis not present

## 2020-04-26 DIAGNOSIS — E785 Hyperlipidemia, unspecified: Secondary | ICD-10-CM | POA: Diagnosis not present

## 2020-04-26 DIAGNOSIS — N390 Urinary tract infection, site not specified: Secondary | ICD-10-CM | POA: Diagnosis not present

## 2020-04-26 DIAGNOSIS — Z85038 Personal history of other malignant neoplasm of large intestine: Secondary | ICD-10-CM | POA: Diagnosis not present

## 2020-04-26 DIAGNOSIS — I252 Old myocardial infarction: Secondary | ICD-10-CM | POA: Diagnosis not present

## 2020-04-26 DIAGNOSIS — D509 Iron deficiency anemia, unspecified: Secondary | ICD-10-CM | POA: Diagnosis not present

## 2020-04-26 DIAGNOSIS — L02214 Cutaneous abscess of groin: Secondary | ICD-10-CM | POA: Diagnosis not present

## 2020-04-26 DIAGNOSIS — I2510 Atherosclerotic heart disease of native coronary artery without angina pectoris: Secondary | ICD-10-CM | POA: Diagnosis not present

## 2020-04-26 DIAGNOSIS — I5040 Unspecified combined systolic (congestive) and diastolic (congestive) heart failure: Secondary | ICD-10-CM | POA: Diagnosis not present

## 2020-04-26 DIAGNOSIS — I130 Hypertensive heart and chronic kidney disease with heart failure and stage 1 through stage 4 chronic kidney disease, or unspecified chronic kidney disease: Secondary | ICD-10-CM | POA: Diagnosis not present

## 2020-04-26 DIAGNOSIS — L03314 Cellulitis of groin: Secondary | ICD-10-CM | POA: Diagnosis not present

## 2020-04-26 DIAGNOSIS — N184 Chronic kidney disease, stage 4 (severe): Secondary | ICD-10-CM | POA: Diagnosis not present

## 2020-04-26 DIAGNOSIS — I255 Ischemic cardiomyopathy: Secondary | ICD-10-CM | POA: Diagnosis not present

## 2020-04-26 DIAGNOSIS — K219 Gastro-esophageal reflux disease without esophagitis: Secondary | ICD-10-CM | POA: Diagnosis not present

## 2020-04-26 DIAGNOSIS — Z8673 Personal history of transient ischemic attack (TIA), and cerebral infarction without residual deficits: Secondary | ICD-10-CM | POA: Diagnosis not present

## 2020-04-26 DIAGNOSIS — J449 Chronic obstructive pulmonary disease, unspecified: Secondary | ICD-10-CM | POA: Diagnosis not present

## 2020-04-26 DIAGNOSIS — B952 Enterococcus as the cause of diseases classified elsewhere: Secondary | ICD-10-CM | POA: Diagnosis not present

## 2020-04-26 DIAGNOSIS — F17210 Nicotine dependence, cigarettes, uncomplicated: Secondary | ICD-10-CM | POA: Diagnosis not present

## 2020-04-26 DIAGNOSIS — D631 Anemia in chronic kidney disease: Secondary | ICD-10-CM | POA: Diagnosis not present

## 2020-04-26 DIAGNOSIS — Z7982 Long term (current) use of aspirin: Secondary | ICD-10-CM | POA: Diagnosis not present

## 2020-04-26 NOTE — Telephone Encounter (Signed)
   Cara from [facility] calling to report she is currently with the patient.  The patients abscess in her pubic area seem to be infected. The area is "open" and oozing puss and feels warm to touch. Patient advised by Moshe Salisbury to go to ED. A message was also left for the plastic surgeon to contact patient  Patient agreed to go to ED  [facility]- [phone]

## 2020-04-27 NOTE — Telephone Encounter (Signed)
Agree with the plan.

## 2020-04-29 ENCOUNTER — Telehealth: Payer: Self-pay

## 2020-04-29 DIAGNOSIS — Z8673 Personal history of transient ischemic attack (TIA), and cerebral infarction without residual deficits: Secondary | ICD-10-CM | POA: Diagnosis not present

## 2020-04-29 DIAGNOSIS — L02214 Cutaneous abscess of groin: Secondary | ICD-10-CM | POA: Diagnosis not present

## 2020-04-29 DIAGNOSIS — K219 Gastro-esophageal reflux disease without esophagitis: Secondary | ICD-10-CM | POA: Diagnosis not present

## 2020-04-29 DIAGNOSIS — I252 Old myocardial infarction: Secondary | ICD-10-CM | POA: Diagnosis not present

## 2020-04-29 DIAGNOSIS — I130 Hypertensive heart and chronic kidney disease with heart failure and stage 1 through stage 4 chronic kidney disease, or unspecified chronic kidney disease: Secondary | ICD-10-CM | POA: Diagnosis not present

## 2020-04-29 DIAGNOSIS — Z85038 Personal history of other malignant neoplasm of large intestine: Secondary | ICD-10-CM | POA: Diagnosis not present

## 2020-04-29 DIAGNOSIS — F17210 Nicotine dependence, cigarettes, uncomplicated: Secondary | ICD-10-CM | POA: Diagnosis not present

## 2020-04-29 DIAGNOSIS — I2510 Atherosclerotic heart disease of native coronary artery without angina pectoris: Secondary | ICD-10-CM | POA: Diagnosis not present

## 2020-04-29 DIAGNOSIS — D631 Anemia in chronic kidney disease: Secondary | ICD-10-CM | POA: Diagnosis not present

## 2020-04-29 DIAGNOSIS — N390 Urinary tract infection, site not specified: Secondary | ICD-10-CM | POA: Diagnosis not present

## 2020-04-29 DIAGNOSIS — E1122 Type 2 diabetes mellitus with diabetic chronic kidney disease: Secondary | ICD-10-CM | POA: Diagnosis not present

## 2020-04-29 DIAGNOSIS — D509 Iron deficiency anemia, unspecified: Secondary | ICD-10-CM | POA: Diagnosis not present

## 2020-04-29 DIAGNOSIS — B952 Enterococcus as the cause of diseases classified elsewhere: Secondary | ICD-10-CM | POA: Diagnosis not present

## 2020-04-29 DIAGNOSIS — Z7982 Long term (current) use of aspirin: Secondary | ICD-10-CM | POA: Diagnosis not present

## 2020-04-29 DIAGNOSIS — L03314 Cellulitis of groin: Secondary | ICD-10-CM | POA: Diagnosis not present

## 2020-04-29 DIAGNOSIS — J449 Chronic obstructive pulmonary disease, unspecified: Secondary | ICD-10-CM | POA: Diagnosis not present

## 2020-04-29 DIAGNOSIS — I5040 Unspecified combined systolic (congestive) and diastolic (congestive) heart failure: Secondary | ICD-10-CM | POA: Diagnosis not present

## 2020-04-29 DIAGNOSIS — I255 Ischemic cardiomyopathy: Secondary | ICD-10-CM | POA: Diagnosis not present

## 2020-04-29 DIAGNOSIS — N184 Chronic kidney disease, stage 4 (severe): Secondary | ICD-10-CM | POA: Diagnosis not present

## 2020-04-29 DIAGNOSIS — E785 Hyperlipidemia, unspecified: Secondary | ICD-10-CM | POA: Diagnosis not present

## 2020-04-29 NOTE — Telephone Encounter (Signed)
Received voicemail from [place], [place] with [facility] with concerns regarding patient's wound. She states a new area has opened on her pubis and although it is a pinpoint opening, she can feel a large, hard area there as well as to the right of the pubis. There is also thick, pink drainage present. Marcene Brawn, RN notes concerns for cellulitis and sepsis and can be reached at: [phone].

## 2020-04-29 NOTE — Progress Notes (Signed)
Subjective:     Patient ID: [name], female    DOB: 1950/09/30, 69 y.o.   MRN: [id number]  Chief Complaint  Patient presents with  . Skin Problem    HPI: The patient is a 69 y.o. female here for follow-up on her multiple groin and perineum wounds.  She has a history of type 2 diabetes, chronic kidney disease, and hypertension.  She was admitted on 8/31 for cellulitis and had I&Ds of multiple perineum abscesses and was discharged on oral antibiotics which she reports she finished.  She sees her primary care physician for pain management.  At last visit on 10/11: * right anterior wound measured 4.5 x 1 x 0.4 cm with epithelialization throughout the wound * right inner groin wound (most caudad) measures 6 x 3.5 x 0.3 cm with tunneling for approximately 3 cm. Granulation tissue present throughout the wound bed. *Right inner groin wound (most cephalad) measures 4 x 1.5 x 0.2 cm with tunneling approximately 2 cm.  Granulation tissue throughout the wound bed.  Dressing changes of packing of the 2 right groin wounds 3 times weekly by wound care nurse along with daily silver alginate to the granulation tissue covered with ABD.  Vaseline to the right anterior thigh wound daily.  Today she reports that the thigh wound has healed; the groin wounds have started draining large amounts of purulent drainage; and she has 3 new abdominal wounds, 1 of which is draining purulent drainage and the other two appear 'ready to pop'. She denies fever/chills and nausea/vomiting. Reports she is having to change the dressing/packing in the groin wounds multiple times per day as they get saturated.  Review of Systems  Constitutional: Negative for chills and fever.  Respiratory: Negative for shortness of breath.   Cardiovascular: Negative for chest pain and leg swelling.  Gastrointestinal: Negative for constipation, diarrhea, nausea and vomiting.  Skin: Positive for wound (3 new abdominal wounds; large  amount of purulent drainage from groin wounds).     Objective:   Vital Signs BP 130/69 (BP Location: Left Arm, Patient Position: Sitting, Cuff Size: Large)   Pulse 88   Temp 97.8 F (36.6 C) (Temporal)   SpO2 100%  Vital Signs and Nursing Note Reviewed  Physical Exam Constitutional:      General: She is not in acute distress.    Appearance: Normal appearance. She is not ill-appearing.  HENT:     Head: Normocephalic and atraumatic.  Eyes:     Extraocular Movements: Extraocular movements intact.  Pulmonary:     Effort: Pulmonary effort is normal.  Abdominal:       Comments: Existing groin wounds have copious amounts of purulent drainage present. Right thigh wound has healed.   Musculoskeletal:        General: Normal range of motion.     Cervical back: Normal range of motion.  Skin:    General: Skin is warm and dry.     Coloration: Skin is not pale.  Neurological:     Mental Status: She is alert and oriented to person, place, and time.     Gait: Gait is intact.  Psychiatric:        Mood and Affect: Mood and affect normal.        Behavior: Behavior normal.        Thought Content: Thought content normal.        Cognition and Memory: Memory normal.        Judgment: Judgment normal.  Pictures were obtained of the patient and placed in the chart with the patient's or guardian's permission.      Assessment/Plan:     ICD-10-CM   1. Right groin wound, subsequent encounter  S31.109D AMB referral to wound care center    Anaerobic and Aerobic Culture  2. Abdominal wall abscess  L02.211 AMB referral to wound care center    Anaerobic and Aerobic Culture   Ms. Kozlov's right thigh wound has healed. The two existing groin wounds have copious amounts of purulent drainage. She also has 3 new abdominal abscesses; 1 of which has purulent drainage, the other 2 have some erythema surround them (superior one >> inferior one). Cultures were taken of the groin wound and  draining left side abdominal wound. Patient was started on Bactrim. Continue with daily packing of groin wounds. She may use xeroform and gauze or ABD for abdominal wounds.   Due to the ongoing wound care needs of this patient, a referral was made to the wound center, however they are refusing the referral and require referral/transfer of care from Dr. Brantley Stage since he did the I&D on the groin wound.  We have requested for referral/transfer of care to be sent from their office to the wound center. They also report their first available appointment is not until Nov 29, so we will continue to provide wound care services for this patient until proper transfer can be made. Patient does have assistance from home health Monday, Wednesday, and Friday for dressing changes.   Follow up in 2 weeks for wound check. Patient instructed to call if condition worsens. Call office with any questions/concerns.   Threasa Heads, PA-C 05/02/2020, 12:50 PM

## 2020-04-29 NOTE — Telephone Encounter (Signed)
Called back, LMVM. Patient has an appointment on 05/02/20 at 10:40

## 2020-04-29 NOTE — Telephone Encounter (Signed)
Olivia Mackie called patient back, LMVM. Patient has an appointment on 05/02/20 at 10:40.//AB/CMA

## 2020-05-01 ENCOUNTER — Other Ambulatory Visit: Payer: Self-pay | Admitting: Internal Medicine

## 2020-05-01 DIAGNOSIS — E785 Hyperlipidemia, unspecified: Secondary | ICD-10-CM | POA: Diagnosis not present

## 2020-05-01 DIAGNOSIS — D509 Iron deficiency anemia, unspecified: Secondary | ICD-10-CM | POA: Diagnosis not present

## 2020-05-01 DIAGNOSIS — Z8673 Personal history of transient ischemic attack (TIA), and cerebral infarction without residual deficits: Secondary | ICD-10-CM | POA: Diagnosis not present

## 2020-05-01 DIAGNOSIS — J449 Chronic obstructive pulmonary disease, unspecified: Secondary | ICD-10-CM | POA: Diagnosis not present

## 2020-05-01 DIAGNOSIS — I5040 Unspecified combined systolic (congestive) and diastolic (congestive) heart failure: Secondary | ICD-10-CM | POA: Diagnosis not present

## 2020-05-01 DIAGNOSIS — I2510 Atherosclerotic heart disease of native coronary artery without angina pectoris: Secondary | ICD-10-CM | POA: Diagnosis not present

## 2020-05-01 DIAGNOSIS — E1122 Type 2 diabetes mellitus with diabetic chronic kidney disease: Secondary | ICD-10-CM | POA: Diagnosis not present

## 2020-05-01 DIAGNOSIS — D631 Anemia in chronic kidney disease: Secondary | ICD-10-CM | POA: Diagnosis not present

## 2020-05-01 DIAGNOSIS — N184 Chronic kidney disease, stage 4 (severe): Secondary | ICD-10-CM | POA: Diagnosis not present

## 2020-05-01 DIAGNOSIS — L02214 Cutaneous abscess of groin: Secondary | ICD-10-CM | POA: Diagnosis not present

## 2020-05-01 DIAGNOSIS — B952 Enterococcus as the cause of diseases classified elsewhere: Secondary | ICD-10-CM | POA: Diagnosis not present

## 2020-05-01 DIAGNOSIS — N390 Urinary tract infection, site not specified: Secondary | ICD-10-CM | POA: Diagnosis not present

## 2020-05-01 DIAGNOSIS — Z85038 Personal history of other malignant neoplasm of large intestine: Secondary | ICD-10-CM | POA: Diagnosis not present

## 2020-05-01 DIAGNOSIS — I252 Old myocardial infarction: Secondary | ICD-10-CM | POA: Diagnosis not present

## 2020-05-01 DIAGNOSIS — Z7982 Long term (current) use of aspirin: Secondary | ICD-10-CM | POA: Diagnosis not present

## 2020-05-01 DIAGNOSIS — I255 Ischemic cardiomyopathy: Secondary | ICD-10-CM | POA: Diagnosis not present

## 2020-05-01 DIAGNOSIS — F17210 Nicotine dependence, cigarettes, uncomplicated: Secondary | ICD-10-CM | POA: Diagnosis not present

## 2020-05-01 DIAGNOSIS — K219 Gastro-esophageal reflux disease without esophagitis: Secondary | ICD-10-CM | POA: Diagnosis not present

## 2020-05-01 DIAGNOSIS — I130 Hypertensive heart and chronic kidney disease with heart failure and stage 1 through stage 4 chronic kidney disease, or unspecified chronic kidney disease: Secondary | ICD-10-CM | POA: Diagnosis not present

## 2020-05-01 DIAGNOSIS — L03314 Cellulitis of groin: Secondary | ICD-10-CM | POA: Diagnosis not present

## 2020-05-02 ENCOUNTER — Encounter: Payer: Self-pay | Admitting: Plastic Surgery

## 2020-05-02 ENCOUNTER — Other Ambulatory Visit (HOSPITAL_COMMUNITY)
Admission: RE | Admit: 2020-05-02 | Discharge: 2020-05-02 | Disposition: A | Discharge status: Home / Self Care | Payer: Medicare Other | Source: Ambulatory Visit | Attending: Plastic Surgery | Admitting: Plastic Surgery

## 2020-05-02 ENCOUNTER — Other Ambulatory Visit: Payer: Self-pay

## 2020-05-02 ENCOUNTER — Ambulatory Visit (INDEPENDENT_AMBULATORY_CARE_PROVIDER_SITE_OTHER): Payer: Medicare Other | Admitting: Plastic Surgery

## 2020-05-02 VITALS — BP 130/69 | HR 88 | Temp 97.8°F

## 2020-05-02 DIAGNOSIS — S31103D Unspecified open wound of abdominal wall, right lower quadrant without penetration into peritoneal cavity, subsequent encounter: Secondary | ICD-10-CM | POA: Insufficient documentation

## 2020-05-02 DIAGNOSIS — D509 Iron deficiency anemia, unspecified: Secondary | ICD-10-CM

## 2020-05-02 DIAGNOSIS — Z8673 Personal history of transient ischemic attack (TIA), and cerebral infarction without residual deficits: Secondary | ICD-10-CM

## 2020-05-02 DIAGNOSIS — Z85038 Personal history of other malignant neoplasm of large intestine: Secondary | ICD-10-CM

## 2020-05-02 DIAGNOSIS — I5040 Unspecified combined systolic (congestive) and diastolic (congestive) heart failure: Secondary | ICD-10-CM

## 2020-05-02 DIAGNOSIS — J449 Chronic obstructive pulmonary disease, unspecified: Secondary | ICD-10-CM

## 2020-05-02 DIAGNOSIS — L03314 Cellulitis of groin: Secondary | ICD-10-CM | POA: Diagnosis not present

## 2020-05-02 DIAGNOSIS — D631 Anemia in chronic kidney disease: Secondary | ICD-10-CM

## 2020-05-02 DIAGNOSIS — I130 Hypertensive heart and chronic kidney disease with heart failure and stage 1 through stage 4 chronic kidney disease, or unspecified chronic kidney disease: Secondary | ICD-10-CM

## 2020-05-02 DIAGNOSIS — L02215 Cutaneous abscess of perineum: Secondary | ICD-10-CM

## 2020-05-02 DIAGNOSIS — E785 Hyperlipidemia, unspecified: Secondary | ICD-10-CM

## 2020-05-02 DIAGNOSIS — L02211 Cutaneous abscess of abdominal wall: Secondary | ICD-10-CM | POA: Insufficient documentation

## 2020-05-02 DIAGNOSIS — E1122 Type 2 diabetes mellitus with diabetic chronic kidney disease: Secondary | ICD-10-CM

## 2020-05-02 DIAGNOSIS — K219 Gastro-esophageal reflux disease without esophagitis: Secondary | ICD-10-CM

## 2020-05-02 DIAGNOSIS — B952 Enterococcus as the cause of diseases classified elsewhere: Secondary | ICD-10-CM | POA: Diagnosis not present

## 2020-05-02 DIAGNOSIS — I255 Ischemic cardiomyopathy: Secondary | ICD-10-CM

## 2020-05-02 DIAGNOSIS — Z9181 History of falling: Secondary | ICD-10-CM

## 2020-05-02 DIAGNOSIS — Z7982 Long term (current) use of aspirin: Secondary | ICD-10-CM

## 2020-05-02 DIAGNOSIS — X58XXXD Exposure to other specified factors, subsequent encounter: Secondary | ICD-10-CM | POA: Insufficient documentation

## 2020-05-02 DIAGNOSIS — S31109D Unspecified open wound of abdominal wall, unspecified quadrant without penetration into peritoneal cavity, subsequent encounter: Secondary | ICD-10-CM | POA: Diagnosis not present

## 2020-05-02 DIAGNOSIS — L02214 Cutaneous abscess of groin: Secondary | ICD-10-CM | POA: Diagnosis not present

## 2020-05-02 DIAGNOSIS — N390 Urinary tract infection, site not specified: Secondary | ICD-10-CM | POA: Diagnosis not present

## 2020-05-02 DIAGNOSIS — I2510 Atherosclerotic heart disease of native coronary artery without angina pectoris: Secondary | ICD-10-CM

## 2020-05-02 DIAGNOSIS — Z6833 Body mass index (BMI) 33.0-33.9, adult: Secondary | ICD-10-CM

## 2020-05-02 DIAGNOSIS — N184 Chronic kidney disease, stage 4 (severe): Secondary | ICD-10-CM

## 2020-05-02 DIAGNOSIS — F17210 Nicotine dependence, cigarettes, uncomplicated: Secondary | ICD-10-CM

## 2020-05-02 DIAGNOSIS — N8500 Endometrial hyperplasia, unspecified: Secondary | ICD-10-CM

## 2020-05-02 DIAGNOSIS — I252 Old myocardial infarction: Secondary | ICD-10-CM

## 2020-05-02 MED ORDER — SULFAMETHOXAZOLE-TRIMETHOPRIM 800-160 MG PO TABS: 1.0000 | ORAL_TABLET | Freq: Two times a day (BID) | ORAL | 0 refills | Status: AC

## 2020-05-03 DIAGNOSIS — B952 Enterococcus as the cause of diseases classified elsewhere: Secondary | ICD-10-CM | POA: Diagnosis not present

## 2020-05-03 DIAGNOSIS — Z8673 Personal history of transient ischemic attack (TIA), and cerebral infarction without residual deficits: Secondary | ICD-10-CM | POA: Diagnosis not present

## 2020-05-03 DIAGNOSIS — Z7982 Long term (current) use of aspirin: Secondary | ICD-10-CM | POA: Diagnosis not present

## 2020-05-03 DIAGNOSIS — I2510 Atherosclerotic heart disease of native coronary artery without angina pectoris: Secondary | ICD-10-CM | POA: Diagnosis not present

## 2020-05-03 DIAGNOSIS — N184 Chronic kidney disease, stage 4 (severe): Secondary | ICD-10-CM | POA: Diagnosis not present

## 2020-05-03 DIAGNOSIS — Z85038 Personal history of other malignant neoplasm of large intestine: Secondary | ICD-10-CM | POA: Diagnosis not present

## 2020-05-03 DIAGNOSIS — L03314 Cellulitis of groin: Secondary | ICD-10-CM | POA: Diagnosis not present

## 2020-05-03 DIAGNOSIS — D509 Iron deficiency anemia, unspecified: Secondary | ICD-10-CM | POA: Diagnosis not present

## 2020-05-03 DIAGNOSIS — N390 Urinary tract infection, site not specified: Secondary | ICD-10-CM | POA: Diagnosis not present

## 2020-05-03 DIAGNOSIS — F17210 Nicotine dependence, cigarettes, uncomplicated: Secondary | ICD-10-CM | POA: Diagnosis not present

## 2020-05-03 DIAGNOSIS — E785 Hyperlipidemia, unspecified: Secondary | ICD-10-CM | POA: Diagnosis not present

## 2020-05-03 DIAGNOSIS — I255 Ischemic cardiomyopathy: Secondary | ICD-10-CM | POA: Diagnosis not present

## 2020-05-03 DIAGNOSIS — I5040 Unspecified combined systolic (congestive) and diastolic (congestive) heart failure: Secondary | ICD-10-CM | POA: Diagnosis not present

## 2020-05-03 DIAGNOSIS — D631 Anemia in chronic kidney disease: Secondary | ICD-10-CM | POA: Diagnosis not present

## 2020-05-03 DIAGNOSIS — E1122 Type 2 diabetes mellitus with diabetic chronic kidney disease: Secondary | ICD-10-CM | POA: Diagnosis not present

## 2020-05-03 DIAGNOSIS — I252 Old myocardial infarction: Secondary | ICD-10-CM | POA: Diagnosis not present

## 2020-05-03 DIAGNOSIS — J449 Chronic obstructive pulmonary disease, unspecified: Secondary | ICD-10-CM | POA: Diagnosis not present

## 2020-05-03 DIAGNOSIS — K219 Gastro-esophageal reflux disease without esophagitis: Secondary | ICD-10-CM | POA: Diagnosis not present

## 2020-05-03 DIAGNOSIS — L02214 Cutaneous abscess of groin: Secondary | ICD-10-CM | POA: Diagnosis not present

## 2020-05-03 DIAGNOSIS — I130 Hypertensive heart and chronic kidney disease with heart failure and stage 1 through stage 4 chronic kidney disease, or unspecified chronic kidney disease: Secondary | ICD-10-CM | POA: Diagnosis not present

## 2020-05-06 ENCOUNTER — Ambulatory Visit: Payer: Medicare Other | Admitting: Internal Medicine

## 2020-05-06 ENCOUNTER — Telehealth: Payer: Self-pay

## 2020-05-06 ENCOUNTER — Telehealth: Payer: Self-pay | Admitting: Internal Medicine

## 2020-05-06 DIAGNOSIS — I252 Old myocardial infarction: Secondary | ICD-10-CM | POA: Diagnosis not present

## 2020-05-06 DIAGNOSIS — I130 Hypertensive heart and chronic kidney disease with heart failure and stage 1 through stage 4 chronic kidney disease, or unspecified chronic kidney disease: Secondary | ICD-10-CM | POA: Diagnosis not present

## 2020-05-06 DIAGNOSIS — L02214 Cutaneous abscess of groin: Secondary | ICD-10-CM | POA: Diagnosis not present

## 2020-05-06 DIAGNOSIS — I5040 Unspecified combined systolic (congestive) and diastolic (congestive) heart failure: Secondary | ICD-10-CM | POA: Diagnosis not present

## 2020-05-06 DIAGNOSIS — F17210 Nicotine dependence, cigarettes, uncomplicated: Secondary | ICD-10-CM | POA: Diagnosis not present

## 2020-05-06 DIAGNOSIS — D509 Iron deficiency anemia, unspecified: Secondary | ICD-10-CM | POA: Diagnosis not present

## 2020-05-06 DIAGNOSIS — K219 Gastro-esophageal reflux disease without esophagitis: Secondary | ICD-10-CM | POA: Diagnosis not present

## 2020-05-06 DIAGNOSIS — E785 Hyperlipidemia, unspecified: Secondary | ICD-10-CM | POA: Diagnosis not present

## 2020-05-06 DIAGNOSIS — I255 Ischemic cardiomyopathy: Secondary | ICD-10-CM | POA: Diagnosis not present

## 2020-05-06 DIAGNOSIS — Z7982 Long term (current) use of aspirin: Secondary | ICD-10-CM | POA: Diagnosis not present

## 2020-05-06 DIAGNOSIS — Z8673 Personal history of transient ischemic attack (TIA), and cerebral infarction without residual deficits: Secondary | ICD-10-CM | POA: Diagnosis not present

## 2020-05-06 DIAGNOSIS — Z85038 Personal history of other malignant neoplasm of large intestine: Secondary | ICD-10-CM | POA: Diagnosis not present

## 2020-05-06 DIAGNOSIS — E1122 Type 2 diabetes mellitus with diabetic chronic kidney disease: Secondary | ICD-10-CM | POA: Diagnosis not present

## 2020-05-06 DIAGNOSIS — D631 Anemia in chronic kidney disease: Secondary | ICD-10-CM | POA: Diagnosis not present

## 2020-05-06 DIAGNOSIS — J449 Chronic obstructive pulmonary disease, unspecified: Secondary | ICD-10-CM | POA: Diagnosis not present

## 2020-05-06 DIAGNOSIS — N184 Chronic kidney disease, stage 4 (severe): Secondary | ICD-10-CM | POA: Diagnosis not present

## 2020-05-06 DIAGNOSIS — L03314 Cellulitis of groin: Secondary | ICD-10-CM | POA: Diagnosis not present

## 2020-05-06 DIAGNOSIS — B952 Enterococcus as the cause of diseases classified elsewhere: Secondary | ICD-10-CM | POA: Diagnosis not present

## 2020-05-06 DIAGNOSIS — N390 Urinary tract infection, site not specified: Secondary | ICD-10-CM | POA: Diagnosis not present

## 2020-05-06 DIAGNOSIS — I2510 Atherosclerotic heart disease of native coronary artery without angina pectoris: Secondary | ICD-10-CM | POA: Diagnosis not present

## 2020-05-06 NOTE — Telephone Encounter (Signed)
Received voicemail from [place] with [facility]. She is requesting an extension of orders for frequency of visits which are 3x weekly for 4 weeks for care and monitoring of wounds. She can be reached at: [phone] and it is ok to leave a voicemail.

## 2020-05-06 NOTE — Telephone Encounter (Signed)
Cara with [facility] is requesting a continuance for nursing orders for 3 times a week. Please call [phone] and it is ok to LVM.

## 2020-05-07 LAB — AEROBIC/ANAEROBIC CULTURE W GRAM STAIN (SURGICAL/DEEP WOUND)

## 2020-05-07 NOTE — Telephone Encounter (Signed)
Called Cara no answer LMOM w/MD response.../lmb 

## 2020-05-07 NOTE — Telephone Encounter (Signed)
Okay.  Thanks.

## 2020-05-07 NOTE — Telephone Encounter (Signed)
Called on (05/06/20) and spoke with [place] w/[facility] regarding the message below.  Gave her verbal orders for extension orders for frequency of visits:(3x weekly for 4 weeks for care and monitoring of wounds) per [facility].  Marcene Brawn w/[facility] verbalized understanding and agreed.//AB/CMA

## 2020-05-08 DIAGNOSIS — E1122 Type 2 diabetes mellitus with diabetic chronic kidney disease: Secondary | ICD-10-CM | POA: Diagnosis not present

## 2020-05-08 DIAGNOSIS — I255 Ischemic cardiomyopathy: Secondary | ICD-10-CM | POA: Diagnosis not present

## 2020-05-08 DIAGNOSIS — I5040 Unspecified combined systolic (congestive) and diastolic (congestive) heart failure: Secondary | ICD-10-CM | POA: Diagnosis not present

## 2020-05-08 DIAGNOSIS — Z85038 Personal history of other malignant neoplasm of large intestine: Secondary | ICD-10-CM | POA: Diagnosis not present

## 2020-05-08 DIAGNOSIS — Z7982 Long term (current) use of aspirin: Secondary | ICD-10-CM | POA: Diagnosis not present

## 2020-05-08 DIAGNOSIS — I252 Old myocardial infarction: Secondary | ICD-10-CM | POA: Diagnosis not present

## 2020-05-08 DIAGNOSIS — K219 Gastro-esophageal reflux disease without esophagitis: Secondary | ICD-10-CM | POA: Diagnosis not present

## 2020-05-08 DIAGNOSIS — N390 Urinary tract infection, site not specified: Secondary | ICD-10-CM | POA: Diagnosis not present

## 2020-05-08 DIAGNOSIS — L03314 Cellulitis of groin: Secondary | ICD-10-CM | POA: Diagnosis not present

## 2020-05-08 DIAGNOSIS — F17210 Nicotine dependence, cigarettes, uncomplicated: Secondary | ICD-10-CM | POA: Diagnosis not present

## 2020-05-08 DIAGNOSIS — D631 Anemia in chronic kidney disease: Secondary | ICD-10-CM | POA: Diagnosis not present

## 2020-05-08 DIAGNOSIS — Z8673 Personal history of transient ischemic attack (TIA), and cerebral infarction without residual deficits: Secondary | ICD-10-CM | POA: Diagnosis not present

## 2020-05-08 DIAGNOSIS — E785 Hyperlipidemia, unspecified: Secondary | ICD-10-CM | POA: Diagnosis not present

## 2020-05-08 DIAGNOSIS — I130 Hypertensive heart and chronic kidney disease with heart failure and stage 1 through stage 4 chronic kidney disease, or unspecified chronic kidney disease: Secondary | ICD-10-CM | POA: Diagnosis not present

## 2020-05-08 DIAGNOSIS — L02214 Cutaneous abscess of groin: Secondary | ICD-10-CM | POA: Diagnosis not present

## 2020-05-08 DIAGNOSIS — I2510 Atherosclerotic heart disease of native coronary artery without angina pectoris: Secondary | ICD-10-CM | POA: Diagnosis not present

## 2020-05-08 DIAGNOSIS — B952 Enterococcus as the cause of diseases classified elsewhere: Secondary | ICD-10-CM | POA: Diagnosis not present

## 2020-05-08 DIAGNOSIS — D509 Iron deficiency anemia, unspecified: Secondary | ICD-10-CM | POA: Diagnosis not present

## 2020-05-08 DIAGNOSIS — J449 Chronic obstructive pulmonary disease, unspecified: Secondary | ICD-10-CM | POA: Diagnosis not present

## 2020-05-08 DIAGNOSIS — N184 Chronic kidney disease, stage 4 (severe): Secondary | ICD-10-CM | POA: Diagnosis not present

## 2020-05-08 LAB — AEROBIC/ANAEROBIC CULTURE W GRAM STAIN (SURGICAL/DEEP WOUND)

## 2020-05-10 DIAGNOSIS — N390 Urinary tract infection, site not specified: Secondary | ICD-10-CM | POA: Diagnosis not present

## 2020-05-10 DIAGNOSIS — B952 Enterococcus as the cause of diseases classified elsewhere: Secondary | ICD-10-CM | POA: Diagnosis not present

## 2020-05-10 DIAGNOSIS — K219 Gastro-esophageal reflux disease without esophagitis: Secondary | ICD-10-CM | POA: Diagnosis not present

## 2020-05-10 DIAGNOSIS — I252 Old myocardial infarction: Secondary | ICD-10-CM | POA: Diagnosis not present

## 2020-05-10 DIAGNOSIS — D509 Iron deficiency anemia, unspecified: Secondary | ICD-10-CM | POA: Diagnosis not present

## 2020-05-10 DIAGNOSIS — I2510 Atherosclerotic heart disease of native coronary artery without angina pectoris: Secondary | ICD-10-CM | POA: Diagnosis not present

## 2020-05-10 DIAGNOSIS — F17210 Nicotine dependence, cigarettes, uncomplicated: Secondary | ICD-10-CM | POA: Diagnosis not present

## 2020-05-10 DIAGNOSIS — I255 Ischemic cardiomyopathy: Secondary | ICD-10-CM | POA: Diagnosis not present

## 2020-05-10 DIAGNOSIS — N184 Chronic kidney disease, stage 4 (severe): Secondary | ICD-10-CM | POA: Diagnosis not present

## 2020-05-10 DIAGNOSIS — Z7982 Long term (current) use of aspirin: Secondary | ICD-10-CM | POA: Diagnosis not present

## 2020-05-10 DIAGNOSIS — E785 Hyperlipidemia, unspecified: Secondary | ICD-10-CM | POA: Diagnosis not present

## 2020-05-10 DIAGNOSIS — I5040 Unspecified combined systolic (congestive) and diastolic (congestive) heart failure: Secondary | ICD-10-CM | POA: Diagnosis not present

## 2020-05-10 DIAGNOSIS — Z85038 Personal history of other malignant neoplasm of large intestine: Secondary | ICD-10-CM | POA: Diagnosis not present

## 2020-05-10 DIAGNOSIS — Z8673 Personal history of transient ischemic attack (TIA), and cerebral infarction without residual deficits: Secondary | ICD-10-CM | POA: Diagnosis not present

## 2020-05-10 DIAGNOSIS — L03314 Cellulitis of groin: Secondary | ICD-10-CM | POA: Diagnosis not present

## 2020-05-10 DIAGNOSIS — I130 Hypertensive heart and chronic kidney disease with heart failure and stage 1 through stage 4 chronic kidney disease, or unspecified chronic kidney disease: Secondary | ICD-10-CM | POA: Diagnosis not present

## 2020-05-10 DIAGNOSIS — J449 Chronic obstructive pulmonary disease, unspecified: Secondary | ICD-10-CM | POA: Diagnosis not present

## 2020-05-10 DIAGNOSIS — L02214 Cutaneous abscess of groin: Secondary | ICD-10-CM | POA: Diagnosis not present

## 2020-05-10 DIAGNOSIS — D631 Anemia in chronic kidney disease: Secondary | ICD-10-CM | POA: Diagnosis not present

## 2020-05-10 DIAGNOSIS — E1122 Type 2 diabetes mellitus with diabetic chronic kidney disease: Secondary | ICD-10-CM | POA: Diagnosis not present

## 2020-05-12 NOTE — Progress Notes (Signed)
Subjective:     Patient ID: [name], female    DOB: Oct 20, 1950, 69 y.o.   MRN: [id number]  Chief Complaint  Patient presents with  . Follow-up    HPI: The patient is a 69 y.o. female here for follow-up on her multiple groin/perineum and abdominal wounds.  She has a history of type 2 diabetes, chronic kidney disease, and hypertension.  She was admitted on 8/31 for cellulitis and had I&D's of multiple perineum abscesses and was discharged on oral antibiotics which she reports she finished.  She sees her primary care physician for pain management.  Home health provides dressing changes 3 times weekly (Monday, Wednesday, Friday.)  At her last visit on 05/02/2020 her right anterior thigh wound was healed with good epithelialization.  Her to right inner groin wounds had copious amounts of purulent drainage present.  She also had 3 new abdominal abscesses, one of which was draining. Cultures were taken and patient was placed on Bactrim DS. Multiple organisms grew on cultures.  She has been doing dressing changes of packing the 2 right groin wounds. Daily dressing changes of xeroform and ABD to abdominal wounds.  Referral was placed to Wound Care due to the ongoing wound care needs of the patient; however, they refused the referral and required referral/transfer of care from Dr. Brantley Stage since he did the I&D on the groin wound. Dr. Josetta Huddle office provided the referral and patient has an appointment with the wound center on Nov 30.    Patient reports her groin wound is improving and having significantly less drainage.  Reports the abdominal wounds have increased in hardness and now 2 out of the 3 are draining.  She also reports the most inferior medial wound has a burning/stinging sensation.  Denies fever/chills, nausea/vomiting.  Reports she finished the Bactrim a few days ago.  Review of Systems  Constitutional: Negative for chills and fever.  Respiratory: Negative for shortness of breath.    Cardiovascular: Negative for chest pain.  Gastrointestinal: Negative for constipation, diarrhea, nausea and vomiting.  Skin: Positive for color change and wound. Negative for pallor and rash.     Objective:   Vital Signs BP 131/62 (BP Location: Left Arm, Patient Position: Sitting, Cuff Size: Large)   Pulse (!) 101   Temp 98.4 F (36.9 C) (Oral)   SpO2 100%  Vital Signs and Nursing Note Reviewed  Physical Exam Constitutional:      General: She is not in acute distress.    Appearance: Normal appearance. She is obese. She is not ill-appearing.  HENT:     Head: Normocephalic and atraumatic.  Eyes:     Extraocular Movements: Extraocular movements intact.  Cardiovascular:     Rate and Rhythm: Normal rate.  Pulmonary:     Effort: Pulmonary effort is normal.  Abdominal:     Comments: 3 abdominal wounds.  Most superior lateral wound and most inferior medial wound currently have purulent drainage.  Induration and erythema present on all 3 wounds.  Genitourinary:    Comments: Groin wounds show much improvement.  No purulent drainage present.  Good healthy tissue present.  One wound has fully closed.  Other wound is 3 x 1 cm.  Musculoskeletal:        General: Normal range of motion.     Cervical back: Normal range of motion.  Skin:    General: Skin is warm and dry.     Coloration: Skin is not pale.     Findings: No erythema  or rash.  Neurological:     Mental Status: She is alert and oriented to person, place, and time.     Gait: Gait is intact.  Psychiatric:        Mood and Affect: Mood and affect normal.        Cognition and Memory: Memory normal.        Judgment: Judgment normal.    Pictures were obtained of the patient and placed in the chart with the patient's or guardian's permission.      Assessment/Plan:     ICD-10-CM   1. Right groin wound, subsequent encounter  S31.109D   2. Abdominal wall abscess  L02.211    Patient's groin wounds show good improvement.   No purulent drainage present.  One wound is fully closed.  Other wound is 3 x 1 cm.  Continue daily dressing changes with assistance from home health wound care nurse 3 times per week.  Patient's abdominal wounds have not shown any improvement.  2 of the 3 wounds are now draining, some purulent drainage present.  Induration present on all 3 abdominal wounds.  Patient started on Augmentin.  Recommend use of Hibiclens wash daily for the next 2 weeks allowing it to sit on the skin for 2 minutes before rinsing off.  Referral made to Dr. Josetta Huddle office for evaluation of possible I&D for the abdominal wounds.    Patient has appointment with wound care clinic on November 30.  Recommend keeping this appointment as she will need ongoing wound care.  Follow-up as needed.  Call office with any questions/concerns.  Threasa Heads, PA-C 05/16/2020, 2:25 PM

## 2020-05-13 DIAGNOSIS — J449 Chronic obstructive pulmonary disease, unspecified: Secondary | ICD-10-CM | POA: Diagnosis not present

## 2020-05-13 DIAGNOSIS — N390 Urinary tract infection, site not specified: Secondary | ICD-10-CM | POA: Diagnosis not present

## 2020-05-13 DIAGNOSIS — F17210 Nicotine dependence, cigarettes, uncomplicated: Secondary | ICD-10-CM | POA: Diagnosis not present

## 2020-05-13 DIAGNOSIS — Z85038 Personal history of other malignant neoplasm of large intestine: Secondary | ICD-10-CM | POA: Diagnosis not present

## 2020-05-13 DIAGNOSIS — I130 Hypertensive heart and chronic kidney disease with heart failure and stage 1 through stage 4 chronic kidney disease, or unspecified chronic kidney disease: Secondary | ICD-10-CM | POA: Diagnosis not present

## 2020-05-13 DIAGNOSIS — L03314 Cellulitis of groin: Secondary | ICD-10-CM | POA: Diagnosis not present

## 2020-05-13 DIAGNOSIS — I252 Old myocardial infarction: Secondary | ICD-10-CM | POA: Diagnosis not present

## 2020-05-13 DIAGNOSIS — L02214 Cutaneous abscess of groin: Secondary | ICD-10-CM | POA: Diagnosis not present

## 2020-05-13 DIAGNOSIS — I5040 Unspecified combined systolic (congestive) and diastolic (congestive) heart failure: Secondary | ICD-10-CM | POA: Diagnosis not present

## 2020-05-13 DIAGNOSIS — E785 Hyperlipidemia, unspecified: Secondary | ICD-10-CM | POA: Diagnosis not present

## 2020-05-13 DIAGNOSIS — D631 Anemia in chronic kidney disease: Secondary | ICD-10-CM | POA: Diagnosis not present

## 2020-05-13 DIAGNOSIS — D509 Iron deficiency anemia, unspecified: Secondary | ICD-10-CM | POA: Diagnosis not present

## 2020-05-13 DIAGNOSIS — Z7982 Long term (current) use of aspirin: Secondary | ICD-10-CM | POA: Diagnosis not present

## 2020-05-13 DIAGNOSIS — N184 Chronic kidney disease, stage 4 (severe): Secondary | ICD-10-CM | POA: Diagnosis not present

## 2020-05-13 DIAGNOSIS — E1122 Type 2 diabetes mellitus with diabetic chronic kidney disease: Secondary | ICD-10-CM | POA: Diagnosis not present

## 2020-05-13 DIAGNOSIS — I2510 Atherosclerotic heart disease of native coronary artery without angina pectoris: Secondary | ICD-10-CM | POA: Diagnosis not present

## 2020-05-13 DIAGNOSIS — K219 Gastro-esophageal reflux disease without esophagitis: Secondary | ICD-10-CM | POA: Diagnosis not present

## 2020-05-13 DIAGNOSIS — Z8673 Personal history of transient ischemic attack (TIA), and cerebral infarction without residual deficits: Secondary | ICD-10-CM | POA: Diagnosis not present

## 2020-05-13 DIAGNOSIS — B952 Enterococcus as the cause of diseases classified elsewhere: Secondary | ICD-10-CM | POA: Diagnosis not present

## 2020-05-13 DIAGNOSIS — I255 Ischemic cardiomyopathy: Secondary | ICD-10-CM | POA: Diagnosis not present

## 2020-05-15 DIAGNOSIS — I130 Hypertensive heart and chronic kidney disease with heart failure and stage 1 through stage 4 chronic kidney disease, or unspecified chronic kidney disease: Secondary | ICD-10-CM | POA: Diagnosis not present

## 2020-05-15 DIAGNOSIS — B952 Enterococcus as the cause of diseases classified elsewhere: Secondary | ICD-10-CM | POA: Diagnosis not present

## 2020-05-15 DIAGNOSIS — I252 Old myocardial infarction: Secondary | ICD-10-CM | POA: Diagnosis not present

## 2020-05-15 DIAGNOSIS — E785 Hyperlipidemia, unspecified: Secondary | ICD-10-CM | POA: Diagnosis not present

## 2020-05-15 DIAGNOSIS — L02214 Cutaneous abscess of groin: Secondary | ICD-10-CM | POA: Diagnosis not present

## 2020-05-15 DIAGNOSIS — I2510 Atherosclerotic heart disease of native coronary artery without angina pectoris: Secondary | ICD-10-CM | POA: Diagnosis not present

## 2020-05-15 DIAGNOSIS — N184 Chronic kidney disease, stage 4 (severe): Secondary | ICD-10-CM | POA: Diagnosis not present

## 2020-05-15 DIAGNOSIS — J449 Chronic obstructive pulmonary disease, unspecified: Secondary | ICD-10-CM | POA: Diagnosis not present

## 2020-05-15 DIAGNOSIS — E1122 Type 2 diabetes mellitus with diabetic chronic kidney disease: Secondary | ICD-10-CM | POA: Diagnosis not present

## 2020-05-15 DIAGNOSIS — N390 Urinary tract infection, site not specified: Secondary | ICD-10-CM | POA: Diagnosis not present

## 2020-05-15 DIAGNOSIS — F17210 Nicotine dependence, cigarettes, uncomplicated: Secondary | ICD-10-CM | POA: Diagnosis not present

## 2020-05-15 DIAGNOSIS — I5040 Unspecified combined systolic (congestive) and diastolic (congestive) heart failure: Secondary | ICD-10-CM | POA: Diagnosis not present

## 2020-05-15 DIAGNOSIS — I255 Ischemic cardiomyopathy: Secondary | ICD-10-CM | POA: Diagnosis not present

## 2020-05-15 DIAGNOSIS — D509 Iron deficiency anemia, unspecified: Secondary | ICD-10-CM | POA: Diagnosis not present

## 2020-05-15 DIAGNOSIS — Z7982 Long term (current) use of aspirin: Secondary | ICD-10-CM | POA: Diagnosis not present

## 2020-05-15 DIAGNOSIS — Z85038 Personal history of other malignant neoplasm of large intestine: Secondary | ICD-10-CM | POA: Diagnosis not present

## 2020-05-15 DIAGNOSIS — Z8673 Personal history of transient ischemic attack (TIA), and cerebral infarction without residual deficits: Secondary | ICD-10-CM | POA: Diagnosis not present

## 2020-05-15 DIAGNOSIS — K219 Gastro-esophageal reflux disease without esophagitis: Secondary | ICD-10-CM | POA: Diagnosis not present

## 2020-05-15 DIAGNOSIS — D631 Anemia in chronic kidney disease: Secondary | ICD-10-CM | POA: Diagnosis not present

## 2020-05-15 DIAGNOSIS — L03314 Cellulitis of groin: Secondary | ICD-10-CM | POA: Diagnosis not present

## 2020-05-16 ENCOUNTER — Ambulatory Visit (INDEPENDENT_AMBULATORY_CARE_PROVIDER_SITE_OTHER): Payer: Medicare Other | Admitting: Plastic Surgery

## 2020-05-16 ENCOUNTER — Encounter: Payer: Self-pay | Admitting: Plastic Surgery

## 2020-05-16 ENCOUNTER — Encounter: Payer: Self-pay | Admitting: Internal Medicine

## 2020-05-16 ENCOUNTER — Ambulatory Visit (INDEPENDENT_AMBULATORY_CARE_PROVIDER_SITE_OTHER): Payer: Medicare Other | Admitting: Internal Medicine

## 2020-05-16 ENCOUNTER — Other Ambulatory Visit: Payer: Self-pay

## 2020-05-16 VITALS — BP 140/80 | HR 97 | Ht 62.0 in | Wt 179.2 lb

## 2020-05-16 VITALS — BP 131/62 | HR 101 | Temp 98.4°F

## 2020-05-16 DIAGNOSIS — S31109D Unspecified open wound of abdominal wall, unspecified quadrant without penetration into peritoneal cavity, subsequent encounter: Secondary | ICD-10-CM

## 2020-05-16 DIAGNOSIS — E785 Hyperlipidemia, unspecified: Secondary | ICD-10-CM | POA: Diagnosis not present

## 2020-05-16 DIAGNOSIS — L02211 Cutaneous abscess of abdominal wall: Secondary | ICD-10-CM | POA: Diagnosis not present

## 2020-05-16 DIAGNOSIS — E049 Nontoxic goiter, unspecified: Secondary | ICD-10-CM | POA: Diagnosis not present

## 2020-05-16 DIAGNOSIS — E1159 Type 2 diabetes mellitus with other circulatory complications: Secondary | ICD-10-CM

## 2020-05-16 DIAGNOSIS — E1165 Type 2 diabetes mellitus with hyperglycemia: Secondary | ICD-10-CM

## 2020-05-16 LAB — POCT GLYCOSYLATED HEMOGLOBIN (HGB A1C): Hemoglobin A1C: 8.3 % — AB (ref 4.0–5.6)

## 2020-05-16 MED ORDER — AMOXICILLIN-POT CLAVULANATE 500-125 MG PO TABS: 1.0000 | ORAL_TABLET | Freq: Two times a day (BID) | ORAL | 0 refills | Status: AC

## 2020-05-16 NOTE — Patient Instructions (Addendum)
Please increase: - Glipizide ER to 10 mg before breakfast until sugars improve  Please stop at the lab.  Please return in 3-4 months with your sugar log.

## 2020-05-16 NOTE — Progress Notes (Addendum)
Patient ID: [name], female   DOB: 1950-08-07, 69 y.o.   MRN: [id number]  This visit occurred during the SARS-CoV-2 public health emergency.  Safety protocols were in place, including screening questions prior to the visit, additional usage of staff PPE, and extensive cleaning of exam room while observing appropriate contact time as indicated for disinfecting solutions.    HPI: [name] is a 69 y.o.-year-old female, returning for f/u DM2, dx in 2006, insulin-independent, uncontrolled, with complications (CAD - s/p AMI 06/2013, CHF; CKD; PN; DR). Last visit 4 mo ago.  Since last visit, she was admitted in 02/2020 with multiple perineal abscesses and cellulitis.  These were I&D and she was started on antibiotics.  She was in [facility] for 2 weeks - she did not like her care there, including the meals.  Patient improved her diet in the last year.  She switched to a more plant-based diet, and eliminated sweet drinks, ice cream, cakes.  Sugars improved significantly.  However, she could not follow this diet more recently.  Reviewed HbA1c levels: Lab Results  Component Value Date   HGBA1C 7.2 (A) 01/10/2020   HGBA1C 7.3 (A) 09/06/2019   HGBA1C 8.4 (H) 05/16/2019  05/02/2019: HbA1c calculated from fructosamine was 7.0% 12/30/2018: HbA1c calculated from fructosamine is higher, at 8.2%, possibly 2/2 drinking juice 08/24/2018: HbA1c calculated from fructosamine is higher, at 8%, possibly 2/2 ABx  04/20/2018: HbA1c calculated from fructosamine is slightly better than before, at 7% 12/15/2017: HbA1c calculated from fructosamine: 7.17%, slightly improved from last visit  08/17/2017: HbA1c calculated from fructosamine: 7.2%, slightly improved 04/15/2017: HbA1c calculated from the fructosamine is lower than measured, at 7.3% 08/21/2016: HbA1c 6.0%. She started to change her diet after her hemoglobin A1c returned high, at 16.2% in 09/2014. She cut down portions, changed the meal  content to include more fiber and less carbs.  HbA1c decreased dramatically.  Pt is on: - Glipizide ER 5 mg in a.m.-started 2016 She stopped metformin ER in 01/2017. She tried Tradjenta 5 mg daily in am >> CP with it (started 10/2014) >> had to stop She tried [place] >> decreased [name] and yeast inf. She tried regular metformin >> diarrhea She refused insulin in the past.  Pt checks her sugars twice a day per review of her log: - am: 95-128 >> 97-129, 155 (spaghetti) >> 116-140, 154 - 2h after b'fast: 108-130 >> 122-139 >> 129-144 - before lunch: 143 >> 112 >> 122, 138 >> 125 - 2h after lunch: 169 >> 120-130 >> n/c - before dinner:  110-144 >> 118-130 >> n/c - 2h after dinner: 120-148 >> 122-141, 170 >> 128-176 - bedtime: 137 >> n/c >> 147-157 >> n/c - nighttime: n/c Lowest sugar was 60 >> 89 >> 68 >> 66 ; it is unclear at which level she has hypoglycemia awareness. Highest sugar was  300 (Prednisone) >> 169 >> 156 >> 176.  Glucometer: One Touch Ultra  Pt's meals are: - Breakfast: oatmeal, cereals, [place] bacon, egg toast; cinnamon on oatmeal or cheerios - Lunch: sandwich or fruit salad - Dinner: salmon/chicken, Brussel sprouts, other veggies, sweet potatoes or brown rice, spaghetti - Snacks: 2: carrots with dip; yoghurt with fruit, veggies + dip; fruit She walks for exercise. She saw Dr. Hollie Salk with nephrology.  She was started on a high potassium, low protein diet  -+ CKD-sees nephrology.  Latest BUN/creatinine: Lab Results  Component Value Date   BUN 25 (H) 03/13/2020   CREATININE 1.54 (H) 03/13/2020  She  stopped olmesartan due to worsening kidney function and cough. -+ HL; last set of lipids: Lab Results  Component Value Date   CHOL 119 01/10/2020   HDL 48.50 01/10/2020   LDLCALC 56 01/10/2020   TRIG 73.0 01/10/2020   CHOLHDL 2 01/10/2020  On Crestor 5 daily. - last eye exam was in 04/2019: + DR, + cataract.  Had laser surgery. Dr. Posey Pronto. - she has Numbness and  tingling in her feet.  She also has a history of HTN, GERD, and anemia.  She has a history of goiter but latest thyroid ultrasound from 2016 showed only small cysts throughout.  Pt denies: - feeling nodules in neck - hoarseness - dysphagia - choking - SOB with lying down  Latest TSH was normal: Lab Results  Component Value Date   TSH 2.17 01/10/2020   ROS: Constitutional: no weight gain/no weight loss, no fatigue, no subjective hyperthermia, no subjective hypothermia Eyes: no blurry vision, no xerophthalmia ENT: no sore throat, + see HPI Cardiovascular: no CP/no SOB/no palpitations/no leg swelling Respiratory: no cough/no SOB/no wheezing Gastrointestinal: no N/no V/no D/no C/no acid reflux Musculoskeletal: no muscle aches/no joint aches Skin: no rashes, no hair loss Neurological: no tremors/+ numbness/+ tingling/no dizziness  I reviewed pt's medications, allergies, PMH, social hx, family hx, and changes were documented in the history of present illness. Otherwise, unchanged from my initial visit note.  Past Medical History:  Diagnosis Date  . Anemia   . COPD (chronic obstructive pulmonary disease) ([place])   . Diabetes mellitus   . Hyperlipidemia   . Hypertension   . Ischemic cardiomyopathy 06/11/2013  . Low back pain   . NSTEMI (non-ST elevated myocardial infarction) ([place]) 06/11/2013  . Obesity   . Personal history of colon cancer   . Renal insufficiency    Past Surgical History:  Procedure Laterality Date  . COLECTOMY    . INCISION AND DRAINAGE PERIRECTAL ABSCESS N/A 03/07/2020   Procedure: IRRIGATION AND DEBRIDEMENT PERINEUM  AND PANNUS ABSCESS;  Surgeon: Erroll Luna, MD;  Location: [place];  Service: General;  Laterality: N/A;  . LEFT HEART CATHETERIZATION WITH CORONARY ANGIOGRAM N/A 06/09/2013   Procedure: LEFT HEART CATHETERIZATION WITH CORONARY ANGIOGRAM;  Surgeon: Ramond Dial, MD;  Location: [facility] CATH LAB;  Service: Cardiovascular;  Laterality: N/A;    History   Social History  . Marital Status: Single    Spouse Name: N/A  . Number of Children: 0   Occupational History  . retired   Social History Main Topics  . Smoking status:  former smoker     Types: Cigarettes  . Smokeless tobacco: Never Used  . Alcohol Use: No  . Drug Use: No   Current Outpatient Medications on File Prior to Visit  Medication Sig Dispense Refill  . acidophilus (RISAQUAD) CAPS capsule Take 1 capsule by mouth daily.    [name] amLODipine (NORVASC) 10 MG tablet TAKE ONE-HALF TABLET BY  MOUTH DAILY (Patient taking differently: Take 5 mg by mouth daily. ) 45 tablet 3  . amoxicillin (AMOXIL) 500 MG capsule Take 1 capsule (500 mg total) by mouth every 8 (eight) hours.    [name] aspirin 81 MG chewable tablet Chew 1 tablet (81 mg total) by mouth daily.    . calcium carbonate (OS-CAL) 600 MG TABS tablet Take 600 mg by mouth 2 (two) times daily with a meal.    . Cholecalciferol (VITAMIN D3) 50 MCG (2000 UT) capsule Take 1 capsule (2,000 Units total) by mouth daily. 100 capsule  3  . HYDROcodone-acetaminophen (NORCO) 7.5-325 MG tablet Take 0.5-1 tablets by mouth every 6 (six) hours as needed for severe pain. 20 tablet 0  . hydrOXYzine (ATARAX/VISTARIL) 25 MG tablet Take 1-2 tablets (25-50 mg total) by mouth every 8 (eight) hours as needed for itching. 60 tablet 0  . insulin aspart (NOVOLOG) 100 UNIT/ML injection Inject 0-15 Units into the skin 3 (three) times daily with meals. 10 mL 11  . insulin aspart (NOVOLOG) 100 UNIT/ML injection Inject 0-5 Units into the skin at bedtime. 10 mL 11  . insulin aspart (NOVOLOG) 100 UNIT/ML injection Inject 8 Units into the skin 3 (three) times daily with meals. 10 mL 11  . ketorolac (ACULAR) 0.5 % ophthalmic solution 1 drop 4 (four) times daily.    . lansoprazole (PREVACID) 15 MG capsule Take 1 capsule (15 mg total) by mouth daily at 12 noon. (Patient taking differently: Take 15 mg by mouth daily as needed (acid reflux). ) 90 capsule 3  .  ondansetron (ZOFRAN) 4 MG tablet Take 1 tablet (4 mg total) by mouth every 8 (eight) hours as needed for nausea or vomiting. 20 tablet 0  . ONETOUCH ULTRA test strip USE TO TEST BLOOD SUGAR TWO TIMES A DAY AS DIRECTED 200 strip 3  . potassium chloride SA (KLOR-CON) 20 MEQ tablet Take 1 tablet (20 mEq total) by mouth daily as needed (cramping).    . rosuvastatin (CRESTOR) 5 MG tablet TAKE 1 TABLET BY MOUTH  DAILY AT 6PM (Patient taking differently: Take 5 mg by mouth daily. ) 90 tablet 3  . torsemide (DEMADEX) 20 MG tablet Take 1 tablet (20 mg total) by mouth daily.    . vitamin B-12 (CYANOCOBALAMIN) 1000 MCG tablet Take 1,000 mcg by mouth daily.     No current facility-administered medications on file prior to visit.   Allergies  Allergen Reactions  . Tradjenta [Linagliptin] Anaphylaxis    CP  . Atorvastatin     REACTION: aches and pains  . Enalapril Maleate     REACTION: cough  . Farxiga [Dapagliflozin] Itching  . Hydrochlorothiazide     REACTION: hair loss  . Kenalog [Triamcinolone Acetonide]     HANDS NUMB   . Metformin And Related     Diarrhea, dizziness  . Propoxyphene N-Acetaminophen Hives  . Simvastatin     REACTION: cramps  . Spironolactone     REACTION: cramps   Family History  Problem Relation Age of Onset  . Hypertension Other    PE: BP 140/80   Pulse 97   Ht 5\' 2"  (1.575 m)   Wt 179 lb 3.2 oz (81.3 kg)   SpO2 98%   BMI 32.78 kg/m  Body mass index is 32.78 kg/m. Wt Readings from Last 3 Encounters:  05/16/20 179 lb 3.2 oz (81.3 kg)  04/15/20 183 lb 12.8 oz (83.4 kg)  04/10/20 185 lb (83.9 kg)   Constitutional: overweight, in NAD Eyes: PERRLA, EOMI, no exophthalmos ENT: moist mucous membranes, no thyromegaly, no cervical lymphadenopathy Cardiovascular: Tachycardia, RR, No RG, +1/6 SEM, + L>R LE edema Respiratory: CTA B Gastrointestinal: abdomen soft, NT, ND, BS+ Musculoskeletal: no deformities, strength intact in all 4 Skin: moist, warm, no  rashes Neurological: no tremor with outstretched hands, DTR normal in all 4  ASSESSMENT: 1. DM2, insulin-independent, uncontrolled, with complications - CAD, s/p AMI 06/2013 - Dr Acie Fredrickson - CHF - CKD - PN - DR - Dr. Jalene Mullet ([facility]) - on IO injections >> improving  2. Goiter -  No neck compression symptoms  - 10/19/2014: thyroid ultrasound: Right thyroid lobe: 4.0 x 1.5 x 1.6 cm. Heterogeneous parenchyma with multiple small cysts identified. The largest measures approximately 0.5 x 0.3 x 0.5 cm. All of the small cysts in the right lobe appears simple and benign.  Left thyroid lobe: 4.5 x 1.4 x 2.0 cm. Heterogeneous parenchyma with multiple cysts. The largest measures approximately 1.0 x 0.4 x 0.5 cm. 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 Similar smaller cyst measures 0.8 cm in greatest diameter. There is a small solid nodule measuring 0.7 x 0.4 x 0.6 cm.  Isthmus Thickness: 0.4 cm. No nodules visualized.  Lymphadenopathy: None visualized.          Multicystic thyroid.  3. HL  PLAN:  1. Patient with longstanding, uncontrolled, type 2 diabetes, on glipizide ER only with improving control after improvement in her diet to a mostly plant-based diet with reduced processed foods and sweets.   -Of note, she did refuse insulin in the past. -At last visit, sugars were excellent and stable throughout the day and she was doing a great job with her diet.  We did not change her regimen.Her HbA1c was slightly better, at 7.2%.  Today: We checked her HbA1c: 8.3% (higher). -At today's visit, reviewing her labs, sugars are slightly higher than before, but still fairly well-controlled.  She had a difficult time since last visit with abdominal and perineum abscesses and was admitted for sepsis.  She was in [place] in place for 2 weeks.  She hopes never to return there.  Now seeing plastic surgery for wound care  -At this visit, we discussed that her  sugars do not appear to correlate with her HbA1c so we will check a fructosamine level.  They are slightly higher, though, and we discussed about increasing the glipizide to 2 tablets a day for now until sugars improved and then back again to 1 tablet of 5 mg daily.  She still refuses insulin. -I advised her to: Patient Instructions  Please increase: - Glipizide ER to 10 mg before breakfast until sugars improve  Please stop at the lab.  Please return in 3-4 months with your sugar log.  - advised to check sugars at different times of the day - 1x a day, rotating check times - advised for yearly eye exams >> she is not UTD >> had to cancel 2/2 recent hosptalization - return to clinic in 3-4 months   2. Goiter -She denies any neck compression symptoms -She only has small thyroid cysts per review of the latest thyroid ultrasound report -I reviewed her TSH from 01/2020 and this was normal -No follow-up is necessary unless she develops neck compression symptoms  3. HL -Reviewed latest lipid panel from 01/2020: All fractions at goal: Lab Results  Component Value Date   CHOL 119 01/10/2020   HDL 48.50 01/10/2020   LDLCALC 56 01/10/2020   TRIG 73.0 01/10/2020   CHOLHDL 2 01/10/2020  -She is on low-dose Crestor, 5 mg every day, without side effects  Component     Latest Ref Rng & Units 05/16/2020  Hemoglobin A1C     4.0 - 5.6 % 8.3 (A)  Fructosamine     205 - 285 umol/L 342 (H)   HbA1c calculated from fructosamine is: 7.4%, lower than the directly measured HbA1c.  Philemon Kingdom, MD PhD [facility]

## 2020-05-16 NOTE — Addendum Note (Signed)
Addended by: Lauralyn Primes on: 05/16/2020 11:43 AM   Modules accepted: Orders

## 2020-05-17 DIAGNOSIS — I130 Hypertensive heart and chronic kidney disease with heart failure and stage 1 through stage 4 chronic kidney disease, or unspecified chronic kidney disease: Secondary | ICD-10-CM | POA: Diagnosis not present

## 2020-05-17 DIAGNOSIS — I2510 Atherosclerotic heart disease of native coronary artery without angina pectoris: Secondary | ICD-10-CM | POA: Diagnosis not present

## 2020-05-17 DIAGNOSIS — I255 Ischemic cardiomyopathy: Secondary | ICD-10-CM | POA: Diagnosis not present

## 2020-05-17 DIAGNOSIS — J449 Chronic obstructive pulmonary disease, unspecified: Secondary | ICD-10-CM | POA: Diagnosis not present

## 2020-05-17 DIAGNOSIS — D509 Iron deficiency anemia, unspecified: Secondary | ICD-10-CM | POA: Diagnosis not present

## 2020-05-17 DIAGNOSIS — N390 Urinary tract infection, site not specified: Secondary | ICD-10-CM | POA: Diagnosis not present

## 2020-05-17 DIAGNOSIS — E1122 Type 2 diabetes mellitus with diabetic chronic kidney disease: Secondary | ICD-10-CM | POA: Diagnosis not present

## 2020-05-17 DIAGNOSIS — I5040 Unspecified combined systolic (congestive) and diastolic (congestive) heart failure: Secondary | ICD-10-CM | POA: Diagnosis not present

## 2020-05-17 DIAGNOSIS — I252 Old myocardial infarction: Secondary | ICD-10-CM | POA: Diagnosis not present

## 2020-05-17 DIAGNOSIS — N184 Chronic kidney disease, stage 4 (severe): Secondary | ICD-10-CM | POA: Diagnosis not present

## 2020-05-17 DIAGNOSIS — L03314 Cellulitis of groin: Secondary | ICD-10-CM | POA: Diagnosis not present

## 2020-05-17 DIAGNOSIS — E785 Hyperlipidemia, unspecified: Secondary | ICD-10-CM | POA: Diagnosis not present

## 2020-05-17 DIAGNOSIS — Z8673 Personal history of transient ischemic attack (TIA), and cerebral infarction without residual deficits: Secondary | ICD-10-CM | POA: Diagnosis not present

## 2020-05-17 DIAGNOSIS — D631 Anemia in chronic kidney disease: Secondary | ICD-10-CM | POA: Diagnosis not present

## 2020-05-17 DIAGNOSIS — Z7982 Long term (current) use of aspirin: Secondary | ICD-10-CM | POA: Diagnosis not present

## 2020-05-17 DIAGNOSIS — F17210 Nicotine dependence, cigarettes, uncomplicated: Secondary | ICD-10-CM | POA: Diagnosis not present

## 2020-05-17 DIAGNOSIS — L02214 Cutaneous abscess of groin: Secondary | ICD-10-CM | POA: Diagnosis not present

## 2020-05-17 DIAGNOSIS — B952 Enterococcus as the cause of diseases classified elsewhere: Secondary | ICD-10-CM | POA: Diagnosis not present

## 2020-05-17 DIAGNOSIS — Z85038 Personal history of other malignant neoplasm of large intestine: Secondary | ICD-10-CM | POA: Diagnosis not present

## 2020-05-17 DIAGNOSIS — K219 Gastro-esophageal reflux disease without esophagitis: Secondary | ICD-10-CM | POA: Diagnosis not present

## 2020-05-20 ENCOUNTER — Other Ambulatory Visit: Payer: Self-pay

## 2020-05-20 ENCOUNTER — Encounter (HOSPITAL_BASED_OUTPATIENT_CLINIC_OR_DEPARTMENT_OTHER): Payer: Medicare Other | Attending: Internal Medicine | Admitting: Internal Medicine

## 2020-05-20 ENCOUNTER — Other Ambulatory Visit (HOSPITAL_COMMUNITY)
Admission: RE | Admit: 2020-05-20 | Discharge: 2020-05-20 | Disposition: A | Discharge status: Home / Self Care | Payer: Medicare Other | Source: Other Acute Inpatient Hospital | Attending: Internal Medicine | Admitting: Internal Medicine

## 2020-05-20 ENCOUNTER — Other Ambulatory Visit: Payer: Self-pay | Admitting: Internal Medicine

## 2020-05-20 ENCOUNTER — Other Ambulatory Visit (HOSPITAL_COMMUNITY): Payer: Self-pay | Admitting: Internal Medicine

## 2020-05-20 DIAGNOSIS — I2510 Atherosclerotic heart disease of native coronary artery without angina pectoris: Secondary | ICD-10-CM | POA: Insufficient documentation

## 2020-05-20 DIAGNOSIS — L02211 Cutaneous abscess of abdominal wall: Secondary | ICD-10-CM

## 2020-05-20 DIAGNOSIS — L02214 Cutaneous abscess of groin: Secondary | ICD-10-CM | POA: Diagnosis not present

## 2020-05-20 DIAGNOSIS — E11628 Type 2 diabetes mellitus with other skin complications: Secondary | ICD-10-CM | POA: Diagnosis not present

## 2020-05-20 DIAGNOSIS — I700 Atherosclerosis of aorta: Secondary | ICD-10-CM | POA: Diagnosis not present

## 2020-05-20 DIAGNOSIS — X58XXXD Exposure to other specified factors, subsequent encounter: Secondary | ICD-10-CM | POA: Diagnosis not present

## 2020-05-20 DIAGNOSIS — L03314 Cellulitis of groin: Secondary | ICD-10-CM | POA: Insufficient documentation

## 2020-05-20 DIAGNOSIS — S31000D Unspecified open wound of lower back and pelvis without penetration into retroperitoneum, subsequent encounter: Secondary | ICD-10-CM | POA: Insufficient documentation

## 2020-05-20 DIAGNOSIS — I509 Heart failure, unspecified: Secondary | ICD-10-CM | POA: Insufficient documentation

## 2020-05-20 LAB — FRUCTOSAMINE: Fructosamine: 342 umol/L — ABNORMAL HIGH (ref 205–285)

## 2020-05-21 NOTE — Progress Notes (Signed)
MISAO, FACKRELL ([id number]) Visit Report for 05/20/2020 Abuse/Suicide Risk Screen Details Patient Name: Date of Service: [name], [name] 05/20/2020 10:30 A M Medical Record Number: [id number] Patient Account Number: [id number] Date of Birth/Sex: Treating RN: 01-28-1951 (69 y.o. Orvan Falconer Primary Care Jarrad Mclees: [name] Other Clinician: Referring Jerelyn Trimarco: Treating Alice Vitelli/Extender: Thayer Headings in Treatment: 0 Abuse/Suicide Risk Screen Items Answer ABUSE RISK SCREEN: Has anyone close to you tried to hurt or harm you recentlyo No Do you feel uncomfortable with anyone in your familyo No Has anyone forced you do things that you didnt want to doo No Electronic Signature(s) Signed: 05/20/2020 5:50:45 PM By: Carlene Coria RN Entered By: Carlene Coria on 05/20/2020 10:59:49 -------------------------------------------------------------------------------- Activities of Daily Living Details Patient Name: Date of Service: [name], [name] 05/20/2020 10:30 A M Medical Record Number: [id number] Patient Account Number: [id number] Date of Birth/Sex: Treating RN: 1951-05-02 (69 y.o. Orvan Falconer Primary Care Ranae Casebier: [name] Other Clinician: Referring Fardowsa Authier: Treating Syrita Dovel/Extender: Thayer Headings in Treatment: 0 Activities of Daily Living Items Answer Activities of Daily Living (Please select one for each item) Drive Automobile Completely Able T Medications ake Completely Able Use T elephone Completely Able Care for Appearance Completely Able Use T oilet Completely Able Bath / Shower Completely Able Dress Self Completely Able Feed Self Completely Able Walk Completely Able Get In / Out Bed Completely Able Housework Completely Able Prepare Meals Completely [place] for Self Completely Able Electronic Signature(s) Signed: 05/20/2020 5:50:45 PM  By: Carlene Coria RN Entered By: Carlene Coria on 05/20/2020 11:00:32 -------------------------------------------------------------------------------- Education Screening Details Patient Name: Date of Service: [name] Patee A. 05/20/2020 10:30 A M Medical Record Number: [id number] Patient Account Number: [id number] Date of Birth/Sex: Treating RN: Mar 20, 1951 (69 y.o. Orvan Falconer Primary Care Ansleigh Safer: [name] Other Clinician: Referring Yerachmiel Spinney: Treating Kajsa Butrum/Extender: Thayer Headings in Treatment: 0 Primary Learner Assessed: Patient Learning Preferences/Education Level/Primary Language Learning Preference: Explanation Highest Education Level: College or Above Preferred Language: English Cognitive Barrier Language Barrier: No Translator Needed: No Memory Deficit: No Emotional Barrier: No Cultural/Religious Beliefs Affecting Medical Care: No Physical Barrier Impaired Vision: Yes Glasses Impaired Hearing: No Decreased Hand dexterity: No Knowledge/Comprehension Knowledge Level: Medium Comprehension Level: High Ability to understand written instructions: High Ability to understand verbal instructions: High Motivation Anxiety Level: Anxious Cooperation: Cooperative Education Importance: Acknowledges Need Interest in Health Problems: Asks Questions Perception: Coherent Willingness to Engage in Self-Management High Activities: Readiness to Engage in Self-Management High Activities: Electronic Signature(s) Signed: 05/20/2020 5:50:45 PM By: Carlene Coria RN Entered By: Carlene Coria on 05/20/2020 11:01:01 -------------------------------------------------------------------------------- Fall Risk Assessment Details Patient Name: Date of Service: [name] Patee A. 05/20/2020 10:30 A M Medical Record Number: [id number] Patient Account Number: [id number] Date of Birth/Sex: Treating RN: [birth year] (69 y.o. Orvan Falconer Primary Care Whitni Pasquini: [name] Other Clinician: Referring Ollis Daudelin: Treating Malaisha Silliman/Extender: Thayer Headings in Treatment: 0 Fall Risk Assessment Items Have you had 2 or more falls in the last 12 monthso 0 Yes Have you had any fall that resulted in injury in the last 12 monthso 0 Yes FALLS RISK SCREEN History of falling - immediate or within 3 months 25 Yes Secondary diagnosis (Do you have 2 or more medical diagnoseso) 0 No Ambulatory aid None/bed rest/wheelchair/nurse 0 No Crutches/cane/walker 0 No Furniture 0 No Intravenous therapy Access/Saline/Heparin Lock 0 No Gait/Transferring Normal/ bed  rest/ wheelchair 0 No Weak (short steps with or without shuffle, stooped but able to lift head while walking, may seek 0 No support from furniture) Impaired (short steps with shuffle, may have difficulty arising from chair, head down, impaired 0 No balance) Mental Status Oriented to own ability 0 No Electronic Signature(s) Signed: 05/20/2020 5:50:45 PM By: Carlene Coria RN Entered By: Carlene Coria on 05/20/2020 11:01:20 -------------------------------------------------------------------------------- Nutrition Risk Screening Details Patient Name: Date of Service: [name], [name] 05/20/2020 10:30 A M Medical Record Number: [id number] Patient Account Number: [id number] Date of Birth/Sex: Treating RN: [birth year] (69 y.o. Orvan Falconer Primary Care Harrietta Incorvaia: [name] Other Clinician: Referring Amayra Kiedrowski: Treating Destini Cambre/Extender: Thayer Headings in Treatment: 0 Height (in): 62 Weight (lbs): 172 Body Mass Index (BMI): 31.5 Nutrition Risk Screening Items Score Screening NUTRITION RISK SCREEN: I have an illness or condition that made me change the kind and/or amount of food I eat 0 No I eat fewer than two meals per day 0 No I eat few fruits and vegetables, or milk products 0 No I have  three or more drinks of beer, liquor or wine almost every day 0 No I have tooth or mouth problems that make it hard for me to eat 0 No I don't always have enough money to buy the food I need 0 No I eat alone most of the time 0 No I take three or more different prescribed or over-the-counter drugs a day 1 Yes Without wanting to, I have lost or gained 10 pounds in the last six months 0 No I am not always physically able to shop, cook and/or feed myself 0 No Nutrition Protocols Good Risk Protocol 0 No interventions needed Moderate Risk Protocol High Risk Proctocol Risk Level: Good Risk Score: 1 Electronic Signature(s) Signed: 05/20/2020 5:50:45 PM By: Carlene Coria RN Entered By: Carlene Coria on 05/20/2020 11:01:40

## 2020-05-21 NOTE — Progress Notes (Signed)
JULIA, ALKHATIB ([id number]) Visit Report for 05/20/2020 Chief Complaint Document Details Patient Name: Date of Service: [name], [name] 05/20/2020 10:30 A M Medical Record Number: [id number] Patient Account Number: [id number] Date of Birth/Sex: Treating RN: Jul 04, 1951 (69 y.o. [name] Primary Care Provider: [name] Other Clinician: Referring Provider: Treating Provider/Extender: Thayer Headings in Treatment: 0 Information Obtained from: Patient Chief Complaint 05/20/2020; patient is here for review of abscesses involving her abdomen, symphysis pubis area and groin. Complicated medical history Electronic Signature(s) Signed: 05/20/2020 5:39:18 PM By: Linton Ham MD Entered By: Linton Ham on 05/20/2020 12:18:37 -------------------------------------------------------------------------------- HPI Details Patient Name: Date of Service: [name] Patee A. 05/20/2020 10:30 A M Medical Record Number: [id number] Patient Account Number: [id number] Date of Birth/Sex: Treating RN: [birth year] (69 y.o. [name] Primary Care Provider: [name] Other Clinician: Referring Provider: Treating Provider/Extender: Thayer Headings in Treatment: 0 History of Present Illness HPI Description: 04/19/2020 ADMISSION this is a patient who became acutely ill with sepsis in late March. She required admission to hospital from 03/05/2020 through 9//9/21 with abscesses in her groin. She was septic with Enterococcus faecalis she is treated initially with Vanco and Zosyn but discharged on amoxicillin. She required surgical debridement of 3 areas including 1 in the anterior thigh and 2 on the right buttock. She was discharged to [facility]. 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 She was seen by plastic surgery  including Dr. Heber [place] and then by Phoebe Sharps one of their PAs twice. At that point they described the areas on the right inner groin and then 3 new abdominal wounds. The last culture I can see done by plastic surgery on 11/3 showed both Klebsiella and Enterobacter. She was on Bactrim for a. And more recently is now on Amoxil presumably for a Streptococcus species on 11/2. She is still taking Amoxil. She has not been feeling systemically unwell no fever no nausea she is back at home. She has home health putting calcium alginate on the wounds. I have included the original CT scan report before her surgery. I am not completely certain that the extent of this was appreciated. This may have accounted for why she developed recurrent areas after discharge from our hospital on her lower abdomen which is clearly commented on in the CT scan report. I am not sure I could paraphrase this any better ADDENDUM REPORT 03/06/2020 21:05 : ADDENDUM: Critical Value/emergent results were called by telephone at the time of interpretation on 03/06/2020 at 9:05 pm to provider Sharlet Salina NP , who verbally acknowledged these results. Electronically Signed By: Lovena Le M.D. On: 03/06/2020 21:05 Signed by Briant Cedar, MD on 03/06/2020 9:08 PM Narrative and Impression CLINICAL DATA: Abdominal pain, abdominal and groin cellulitis EXAM: CT ABDOMEN AND PELVIS WITHOUT CONTRAST TECHNIQUE: Multidetector CT imaging of the abdomen and pelvis was performed following the standard protocol without IV contrast. COMPARISON: None. FINDINGS: Lower chest: Lung bases are clear. Cardiac size at the upper limits normal. Coronary artery calcifications are present. Suspect calcification of the mitral annulus as well. Hypoattenuation of the cardiac blood pool may reflect a mild anemia. Hepatobiliary: No visible concerning liver lesion within the limitations of this unenhanced of motion degraded CT Smooth liver . surface contour.  Normal liver attenuation. Gallbladder is unremarkable. Question some biliary ductal dilatation difficult to fully discern given the  lack of contrast media and extensive motion artifact. The extrahepatic common bile duct may measure up to 1.5 cm at the level of the pancreatic head. No visible intraductal gallstones. Pancreas: Poorly assessed given extensive motion artifact photon starvation. No pancreatic ductal dilatation or surrounding inflammatory changes. Spleen: Normal in size. No concerning splenic lesions. Splenic hilar vascular calcifications are noted. Adrenals/Urinary Tract: Adrenal glands are unremarkable. Kidneys are symmetric in size and normally located. Question some mild perinephric stranding versus volume averaging in the setting of motion artifact. No visible concerning renal lesion. No urolithiasis or hydronephrosis. Urinary bladder is unremarkable. Stomach/Bowel: Small sliding-type hiatal hernia. Distal stomach and duodenum are unremarkable. No small or large bowel thickening or dilatation. No evidence of bowel obstruction. Appendix is not visualized. No focal inflammation the vicinity of the cecum to suggest an occult appendicitis. Vascular/Lymphatic: Atherosclerotic calcifications within the abdominal aorta and branch vessels. No aneurysm or ectasia. No enlarged abdominopelvic lymph nodes. Likely reactive adenopathy seen in the inguinal regions. Reproductive: Anteverted uterus. Question endometrial thickening, greater than expected for patient age measuring up to 10 mm in struck thickness. No concerning adnexal lesions. Few parametrial calcifications are atypical senescent finding Other: There is extensive soft tissue stranding and thickening extending across the low anterior abdominal wall/panniculus. Large lobular phlegmon in the right groin/pudendal tissues superficial to the right adductor musculature and right inferior pubic ramus anteriorly with small  amount of soft tissue gas. Is region is difficult to accurately measure given the lobular margins of med does extend up to 4.3 x 6.2 cm in maximal transaxial dimensions. Organized collection/abscess formation is difficult to ascertain in the absence of contrast media. Musculoskeletal: Soft tissue thickening and edematous changes of the adductor compartment of the right thigh could reflect some reactive or inflammatory myositis. No discernible intramuscular collection is seen. No acute or suspicious osseous lesions. Multilevel degenerative changes in the spine, hips and pelvis. Mild grade 1 anterolisthesis L4 on 5. No spondylolysis is evident. IMPRESSION: 1. Extensive soft tissue stranding and thickening extending across the low anterior abdominal wall/panniculus with a large, heterogeneous phlegmon within the deep space of the right groin and pudendal soft tissues along the right adductor musculature and right inferior pubic ramus with small amount of soft tissue gas. Organized collection/abscess formation is difficult to ascertain in the absence of contrast media. Findings are concerning for severe cellulitis and aggressive soft tissue infection. Emergent surgical consultation is warranted. 2. Soft tissue thickening and edematous changes of the adductor compartment of the right thigh could reflect some reactive or inflammatory myositis. No discernible intramuscular collection is seen. 3. Question endometrial thickening, greater than expected for patient age measuring up to 10 mm in thickness. Recommend further evaluation with outpatient pelvic ultrasound when patient is able to better tolerate. 4. Aortic Atherosclerosis (ICD10-I70.0). Currently attempting to contact the ordering provider with a critical value result. Addendum will be submitted upon case discussion. Electronically Signed: By: Lovena Le M.D. On: 03/06/2020 21:02 Electronic Signature(s) Signed: 05/20/2020 5:39:18  PM By: Linton Ham MD Entered By: Linton Ham on 05/20/2020 12:41:03 -------------------------------------------------------------------------------- Physical Exam Details Patient Name: Date of Service: [name] Patee A. 05/20/2020 10:30 A M Medical Record Number: [id number] Patient Account Number: [id number] Date of Birth/Sex: Treating RN: 10/31/1950 (69 y.o. [name] Primary Care Provider: [name] Other Clinician: Referring Provider: Treating Provider/Extender: Thayer Headings in Treatment: 0 Constitutional Sitting or standing Blood Pressure is within target range for patient.. Pulse regular and within target range for  patient.[name] Respirations regular, non-labored and within target range.. Temperature is normal and within the target range for the patient.[name] Appears in no distress. Eyes Conjunctivae clear. No discharge.no icterus. Respiratory work of breathing is normal. Cardiovascular Heart rhythm and rate regular, without murmur or gallop.. Gastrointestinal (GI) The patient has firm nodular areas in the right lower quadrant x2 just above the level of her symphysis. These are nontender.. No liver or spleen enlargement. Lymphatic There is no inguinal lymphadenopathy. Integumentary (Hair, Skin) No systemic rashes seen. Notes Wound exam; the patient has several concerning areas; 1. She has a purulent draining area coming from the upper part of her symphysis with copious amounts of sanguinous drainage I have obtained a specimen of this for culture. This does not appear to have a lot of depth but is not superficial either. 2. On the right medial thigh there is an area that is closed. In close proximity to the right part of her labia there is a mostly epithelialized scarred area however the superior part of this tunnel 6.5 cm superiorly 3. On the right lower abdomen just above the symphysis is a large ballotable area that is  nontender but the patient states if she presses on this it drains out of the area noted above on the symphysis. She also has a nodular area on the abdomen above this. No other abdominal masses were noted Electronic Signature(s) Signed: 05/20/2020 5:39:18 PM By: Linton Ham MD Entered By: Linton Ham on 05/20/2020 12:46:01 -------------------------------------------------------------------------------- Physician Orders Details Patient Name: Date of Service: [name] Patee A. 05/20/2020 10:30 A M Medical Record Number: [id number] Patient Account Number: [id number] Date of Birth/Sex: Treating RN: 1950/07/17 (69 y.o. 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 Dressing Change Frequency Wound #1 Pubis Change dressing three times week. - by home health Wound #2 Right Groin Change dressing three times week. - by home health Wound Cleansing May shower and wash wound with soap and water. - on days that dressing is changed Primary Wound Dressing Wound #1 Pubis Calcium Alginate with Silver Wound #2 Right Groin Calcium Alginate with Silver Secondary Dressing Wound #1 Pubis Dry Gauze - secure with tape Wound #2 Right Groin Dry Gauze - secure with tape [facility] skilled nursing for wound care. 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 Linton Ham MD Patient Name: Provider: Mar 08, 1951 [id number] Date of Birth: NPI#: [id number] Sex: DEA #: [phone] [id number] Phone #: License #: [place] Patient Address: [address] [place], [place] [address] [place], [place] [address] [phone] Allergies atorvastatin; enalapril; [place] Gl[place];  Kenalog; metformin; propoxyphene napsylate; simvastatin; spironolactone Provider's Orders bdomen and Pelvis, without contrast - ICD10: L02.211 - ***URGENT*** - Non healing tunneling ulcers on Computed Tomography (CT) Scan of A pubis and right groin, concerning for abscess Hand Signature: Date(s): Electronic Signature(s) Signed: 05/20/2020 5:39:18 PM By: Linton Ham MD Signed: 05/20/2020 5:51:53 PM By: Levan Hurst RN, BSN Entered By: Levan Hurst on 05/20/2020 12:44:12 -------------------------------------------------------------------------------- Problem List Details Patient Name: Date of Service: [name] Patee A. 05/20/2020 10:30 A M Medical Record Number: [id number] Patient Account Number: [id number] Date of Birth/Sex: Treating RN: 08/13/1950 (69 y.o. 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 05/20/2020 10:30 A M Medical Record Number: [id number] Patient Account Number: [id number] Date of Birth/Sex: Treating RN: 06-29-1951 (69 y.o. [name] Primary Care Provider: [name] Other Clinician: Referring Provider: Treating Provider/Extender: Thayer Headings in Treatment: 0 Subjective Chief Complaint Information obtained from Patient 05/20/2020; patient is here for review of abscesses involving her abdomen, symphysis pubis area and groin. Complicated medical history History of Present Illness (HPI) 04/19/2020 ADMISSION this is a patient who became acutely ill with sepsis in late March. She required admission to hospital from 03/05/2020 through 9//9/21 with abscesses in her groin. She was septic with Enterococcus faecalis she is treated initially with Vanco and Zosyn but discharged on amoxicillin. She required surgical debridement of 3 areas including 1 in the anterior thigh and 2 on the right buttock. She was discharged to [facility]. 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 She was seen by plastic surgery including Dr. Heber [place] and then by Phoebe Sharps one of their PAs twice. At that point they described the areas on the right inner groin and then 3 new abdominal wounds. The last culture I can see done by plastic surgery  on 11/3 showed both Klebsiella and Enterobacter. She was on Bactrim for a. And more recently is now on Amoxil presumably for a Streptococcus species on 11/2. She is still taking Amoxil. She has not been feeling systemically unwell no fever no nausea she is back at home. She has home health putting calcium alginate on the wounds. I have included the original CT scan report before her surgery. I am not completely certain that the extent of this was appreciated. This may have accounted for why she developed recurrent areas after discharge from our hospital on her lower abdomen which is clearly commented on in the CT scan report. I am not sure I could paraphrase this any better ADDENDUM REPORT 03/06/2020 21:05 : ADDENDUM: Critical Value/emergent results were called by telephone at the time of interpretation on 03/06/2020 at 9:05 pm to provider Sharlet Salina NP , who verbally acknowledged these results. Electronically Signed By: Lovena Le M.D. On: 03/06/2020 21:05 Signed by Briant Cedar, MD on 03/06/2020 9:08 PM Narrative and Impression CLINICAL DATA: Abdominal pain, abdominal and groin cellulitis EXAM:  CT ABDOMEN AND PELVIS WITHOUT CONTRAST TECHNIQUE: Multidetector CT imaging of the abdomen and pelvis was performed following the standard protocol without IV contrast. COMPARISON: None. FINDINGS: Lower chest: Lung bases are clear. Cardiac size at the upper limits normal. Coronary artery calcifications are present. Suspect calcification of the mitral annulus as well. Hypoattenuation of the cardiac blood pool may reflect a mild anemia. Hepatobiliary: No visible concerning liver lesion within the limitations of this unenhanced of motion degraded CT Smooth liver . surface contour. Normal liver attenuation. Gallbladder is unremarkable. Question some biliary ductal dilatation difficult to fully discern given the lack of contrast media and extensive motion artifact. The extrahepatic common bile duct  may measure up to 1.5 cm at the level of the pancreatic head. No visible intraductal gallstones. Pancreas: Poorly assessed given extensive motion artifact photon starvation. No pancreatic ductal dilatation or surrounding inflammatory changes. Spleen: Normal in size. No concerning splenic lesions. Splenic hilar vascular calcifications are noted. Adrenals/Urinary Tract: Adrenal glands are unremarkable. Kidneys are symmetric in size and normally located. Question some mild perinephric stranding versus volume averaging in the setting of motion artifact. No visible concerning renal lesion. No urolithiasis or hydronephrosis. Urinary bladder is unremarkable. Stomach/Bowel: Small sliding-type hiatal hernia. Distal stomach and duodenum are unremarkable. No small or large bowel thickening or dilatation. No evidence of bowel obstruction. Appendix is not visualized. No focal inflammation the vicinity of the cecum to suggest an occult appendicitis. Vascular/Lymphatic: Atherosclerotic calcifications within the abdominal aorta and branch vessels. No aneurysm or ectasia. No enlarged abdominopelvic lymph nodes. Likely reactive adenopathy seen in the inguinal regions. Reproductive: Anteverted uterus. Question endometrial thickening, greater than expected for patient age measuring up to 10 mm in struck thickness. No concerning adnexal lesions. Few parametrial calcifications are atypical senescent finding Other: There is extensive soft tissue stranding and thickening extending across the low anterior abdominal wall/panniculus. Large lobular phlegmon in the right groin/pudendal tissues superficial to the right adductor musculature and right inferior pubic ramus anteriorly with small amount of soft tissue gas. Is region is difficult to accurately measure given the lobular margins of med does extend up to 4.3 x 6.2 cm in maximal transaxial dimensions. Organized collection/abscess formation is difficult to  ascertain in the absence of contrast media. Musculoskeletal: Soft tissue thickening and edematous changes of the adductor compartment of the right thigh could reflect some reactive or inflammatory myositis. No discernible intramuscular collection is seen. No acute or suspicious osseous lesions. Multilevel degenerative changes in the spine, hips and pelvis. Mild grade 1 anterolisthesis L4 on 5. No spondylolysis is evident. IMPRESSION: 1. Extensive soft tissue stranding and thickening extending across the low anterior abdominal wall/panniculus with a large, heterogeneous phlegmon within the deep space of the right groin and pudendal soft tissues along the right adductor musculature and right inferior pubic ramus with small amount of soft tissue gas. Organized collection/abscess formation is difficult to ascertain in the absence of contrast media. Findings are concerning for severe cellulitis and aggressive soft tissue infection. Emergent surgical consultation is warranted. 2. Soft tissue thickening and edematous changes of the adductor compartment of the right thigh could reflect some reactive or inflammatory myositis. No discernible intramuscular collection is seen. 3. Question endometrial thickening, greater than expected for patient age measuring up to 10 mm in thickness. Recommend further evaluation with outpatient pelvic ultrasound when patient is able to better tolerate. 4. Aortic Atherosclerosis (ICD10-I70.0). Currently attempting to contact the ordering provider with a critical value result. Addendum will be submitted upon case discussion.  Electronically Signed: By: Lovena Le M.D. On: 03/06/2020 21:02 Patient History Information obtained from Patient. Allergies atorvastatin, enalapril, Farxiga, Kenalog, metformin, propoxyphene napsylate, simvastatin, spironolactone Family History Cancer - Father, Hypertension - Father, Kidney Disease - Father, No family history of  Diabetes, Heart Disease, Hereditary Spherocytosis, Lung Disease, Seizures, Stroke, Thyroid Problems, Tuberculosis. Social History Current some day smoker, Marital Status - Single, Alcohol Use - Never, Drug Use - No History, Caffeine Use - Never. Medical History Eyes Denies history of Cataracts, Glaucoma, Optic Neuritis Ear/Nose/Mouth/Throat Denies history of Chronic sinus problems/congestion, Middle ear problems Hematologic/Lymphatic Denies history of Anemia, Hemophilia, Human Immunodeficiency Virus, Lymphedema, Sickle Cell Disease Respiratory Denies history of Aspiration, Asthma, Chronic Obstructive Pulmonary Disease (COPD), Pneumothorax, Sleep Apnea, Tuberculosis Cardiovascular Patient has history of Congestive Heart Failure, Coronary Artery Disease, Myocardial Infarction Denies history of Angina, Arrhythmia, Deep Vein Thrombosis, Hypertension, Hypotension, Peripheral Arterial Disease, Peripheral Venous Disease, Phlebitis, Vasculitis Gastrointestinal Denies history of Cirrhosis , Colitis, Crohnoos, Hepatitis A, Hepatitis B, Hepatitis C Endocrine Patient has history of Type II Diabetes Denies history of Type I Diabetes Genitourinary Denies history of End Stage Renal Disease Immunological Denies history of Lupus Erythematosus, Raynaudoos, Scleroderma Integumentary (Skin) Denies history of History of Burn Musculoskeletal Denies history of Gout, Rheumatoid Arthritis, Osteoarthritis, Osteomyelitis Neurologic Denies history of Dementia, Neuropathy, Quadriplegia, Paraplegia, Seizure Disorder Oncologic Denies history of Received Chemotherapy, Received Radiation Psychiatric Denies history of Anorexia/bulimia, Confinement Anxiety Patient is treated with Oral Agents. Blood sugar is not tested. Review of Systems (ROS) Constitutional Symptoms (General Health) Denies complaints or symptoms of Fatigue, Fever, Chills, Marked Weight Change. Eyes Complains or has symptoms of Glasses /  Contacts. Denies complaints or symptoms of Dry Eyes, Vision Changes. Ear/Nose/Mouth/Throat Denies complaints or symptoms of Chronic sinus problems or rhinitis. Respiratory Denies complaints or symptoms of Chronic or frequent coughs, Shortness of Breath. Cardiovascular Denies complaints or symptoms of Chest pain. Gastrointestinal Denies complaints or symptoms of Frequent diarrhea, Nausea, Vomiting. Endocrine Denies complaints or symptoms of Heat/cold intolerance. Genitourinary Denies complaints or symptoms of Frequent urination. Integumentary (Skin) Complains or has symptoms of Wounds. Musculoskeletal Denies complaints or symptoms of Muscle Pain, Muscle Weakness. Neurologic Denies complaints or symptoms of Numbness/parasthesias. Psychiatric Denies complaints or symptoms of Claustrophobia, Suicidal. Objective Constitutional Sitting or standing Blood Pressure is within target range for patient.. Pulse regular and within target range for patient.[name] Respirations regular, non-labored and within target range.. Temperature is normal and within the target range for the patient.[name] Appears in no distress. Vitals Time Taken: 10:54 AM, Height: 62 in, Source: Stated, Weight: 172 lbs, Source: Stated, BMI: 31.5, Temperature: 98.4 F, Pulse: 91 bpm, Respiratory Rate: 18 breaths/min, Blood Pressure: 136/77 mmHg. Eyes Conjunctivae clear. No discharge.no icterus. Respiratory work of breathing is normal. Cardiovascular Heart rhythm and rate regular, without murmur or gallop.. Gastrointestinal (GI) The patient has firm nodular areas in the right lower quadrant x2 just above the level of her symphysis. These are nontender.. No liver or spleen enlargement. Lymphatic There is no inguinal lymphadenopathy. General Notes: Wound exam; the patient has several concerning areas; 1. She has a purulent draining area coming from the upper part of her symphysis with copious amounts of sanguinous drainage I have  obtained a specimen of this for culture. This does not appear to have a lot of depth but is not superficial either. 2. On the right medial thigh there is an area that is closed. In close proximity to the right part of her labia there is a mostly epithelialized scarred area however  the superior part of this tunnel 6.5 cm superiorly 3. On the right lower abdomen just above the symphysis is a large ballotable area that is nontender but the patient states if she presses on this it drains out of the area noted above on the symphysis. She also has a nodular area on the abdomen above this. No other abdominal masses were noted Integumentary (Hair, Skin) No systemic rashes seen. Wound #1 status is Open. Original cause of wound was Gradually Appeared. The wound is located on the Pubis. The wound measures 0.2cm length x 0.2cm width x 1cm depth; 0.031cm^2 area and 0.031cm^3 volume. There is Fat Layer (Subcutaneous Tissue) exposed. There is no tunneling noted, however, there is undermining starting at 12:00 and ending at 12:00 with a maximum distance of 1cm. There is a large amount of purulent drainage noted. The wound margin is flat and intact. There is large (67-100%) red granulation within the wound bed. There is no necrotic tissue within the wound bed. Wound #2 status is Open. Original cause of wound was Gradually Appeared. The wound is located on the Right Groin. The wound measures 0.2cm length x 1.5cm width x 0.1cm depth; 0.236cm^2 area and 0.024cm^3 volume. There is Fat Layer (Subcutaneous Tissue) exposed. There is no undermining noted, however, there is tunneling at 12:00 with a maximum distance of 6.5cm. There is a medium amount of serosanguineous drainage noted. The wound margin is well defined and not attached to the wound base. There is large (67-100%) pink granulation within the wound bed. There is no necrotic tissue within the wound bed. 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 Dressing Change Frequency: Wound #1 Pubis: Change dressing three times week. - by home health Wound #2 Right Groin: Change dressing three times week. - by home health Wound Cleansing: May shower and wash wound with soap and water. - on days that dressing is changed Primary Wound Dressing: Wound #1 Pubis: Calcium Alginate with Silver Wound #2 Right Groin: Calcium Alginate with Silver Secondary Dressing: Wound #1 Pubis: Dry Gauze - secure with tape Wound #2 Right Groin: Dry Gauze - secure with tape Home Health: [facility] [place] [facility] skilled [facility] for wound care. 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 Very concerning situation 2. Her original CT scan showed extensive soft tissue stranding and thickening on the lower abdominal wall suggestive of panniculitis presumably infectious with a large heterogeneous phlegmon within the deep suppose of the right groin presumably an abscess. The phlegmon extended along the right abductor muscle and right inferior pubic ramus with a small amount of soft tissue gas organizing collection abscess. I could not find her original surgical report but I feel that there is likely something that requires drainage here either surgical or possibly an interventional radiology. 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 3. She had Enterococcus faecalis sepsis when she came into the hospital. More recent cultures have shown gram-negative's as well as Streptococcus 4 silver alginate over the wound draining areas although I do not  think this is going to be that helpful wound in the short-term 5. She is on amoxicillin currently. I cultured  the sanguinous drainage coming out of the in the left upper part of her symphysis but I have not altered her antibiotics. 6. I have advised her that if she becomes febrile or hyperglycemic or unwell to seek urgent medical attention in the short-term. I am not sure that this represents a "wound issue" at all. I am concerned that all of this reflects continued drainage from underlying infection possibly in the deep soft tissues/o Osteomyelitis as described #7 I have ordered a CT scan unfortunately it is going to have to be unenhanced since she has at least stage III-IV chronic renal failure. I could not find a baseline creatinine. She left the hospital with a creatinine of 1.5 but it seems her baseline in July was higher than this 8. I am expecting to have to refer this patient either back to general surgery possibly interventional radiology and/or infectious disease I spent 50 minutes in review of this patient's past medical history, face-to-face evaluation and preparation of this record Electronic Signature(s) Signed: 05/20/2020 5:39:18 PM By: Linton Ham MD Entered By: Linton Ham on 05/20/2020 12:51:50 -------------------------------------------------------------------------------- HxROS Details Patient Name: Date of Service: [name] Patee A. 05/20/2020 10:30 A M Medical Record Number: [id number] Patient Account Number: [id number] Date of Birth/Sex: Treating RN: [birth year] (69 y.o. Orvan Falconer Primary Care Provider: [name] Other Clinician: Referring Provider: Treating Provider/Extender: Thayer Headings in Treatment: 0 Information Obtained From Patient Constitutional Symptoms (General Health) Complaints and Symptoms: Negative for: Fatigue; Fever; Chills; Marked Weight Change Eyes Complaints and Symptoms: Positive for:  Glasses / Contacts Negative for: Dry Eyes; Vision Changes Medical History: Negative for: Cataracts; Glaucoma; Optic Neuritis Ear/Nose/Mouth/Throat Complaints and Symptoms: Negative for: Chronic sinus problems or rhinitis Medical History: Negative for: Chronic sinus problems/congestion; Middle ear problems Respiratory Complaints and Symptoms: Negative for: Chronic or frequent coughs; Shortness of Breath Medical History: Negative for: Aspiration; Asthma; Chronic Obstructive Pulmonary Disease (COPD); Pneumothorax; Sleep Apnea; Tuberculosis Cardiovascular Complaints and Symptoms: Negative for: Chest pain Medical History: Positive for: Congestive Heart Failure; Coronary Artery Disease; Myocardial Infarction Negative for: Angina; Arrhythmia; Deep Vein Thrombosis; Hypertension; Hypotension; Peripheral Arterial Disease; Peripheral Venous Disease; Phlebitis; Vasculitis Gastrointestinal Complaints and Symptoms: Negative for: Frequent diarrhea; Nausea; Vomiting Medical History: Negative for: Cirrhosis ; Colitis; Crohns; Hepatitis A; Hepatitis B; Hepatitis C Endocrine Complaints and Symptoms: Negative for: Heat/cold intolerance Medical History: Positive for: Type II Diabetes Negative for: Type I Diabetes Time with diabetes: 40 years Treated with: Oral agents Blood sugar tested every day: No Genitourinary Complaints and Symptoms: Negative for: Frequent urination Medical History: Negative for: End Stage Renal Disease Integumentary (Skin) Complaints and Symptoms: Positive for: Wounds Medical History: Negative for: History of Burn Musculoskeletal Complaints and Symptoms: Negative for: Muscle Pain; Muscle Weakness Medical History: Negative for: Gout; Rheumatoid Arthritis; Osteoarthritis; Osteomyelitis Neurologic Complaints and Symptoms: Negative for: Numbness/parasthesias Medical History: Negative for: Dementia; Neuropathy; Quadriplegia; Paraplegia; Seizure  Disorder Psychiatric Complaints and Symptoms: Negative for: Claustrophobia; Suicidal Medical History: Negative for: Anorexia/bulimia; Confinement Anxiety Hematologic/Lymphatic Medical History: Negative for: Anemia; Hemophilia; Human Immunodeficiency Virus; Lymphedema; Sickle Cell Disease Immunological Medical History: Negative for: Lupus Erythematosus; Raynauds; Scleroderma Oncologic Medical History: Negative for: Received Chemotherapy; Received Radiation Immunizations Pneumococcal Vaccine: Received Pneumococcal Vaccination: No Implantable Devices None Family and Social History Cancer: Yes - Father; Diabetes: No; Heart Disease: No; Hereditary Spherocytosis: No; Hypertension: Yes - Father; Kidney Disease: Yes - Father; Lung Disease: No; Seizures: No; Stroke: No; Thyroid Problems: No; Tuberculosis: No; Current some day smoker; Marital Status -  Single; Alcohol Use: Never; Drug Use: No History; Caffeine Use: Never; Financial Concerns: No; Food, Clothing or Shelter Needs: No; Support System Lacking: No; Transportation Concerns: No Electronic Signature(s) Signed: 05/20/2020 5:39:18 PM By: Linton Ham MD Signed: 05/20/2020 5:50:45 PM By: Carlene Coria RN Entered By: Carlene Coria on 05/20/2020 10:59:38 -------------------------------------------------------------------------------- SuperBill Details Patient Name: Date of Service: [name] Patee A. 05/20/2020 Medical Record Number: [id number] Patient Account Number: [id number] Date of Birth/Sex: Treating RN: 09-07-1950 (69 y.o. 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

## 2020-05-21 NOTE — Progress Notes (Signed)
[name], [name] ([id number]) Visit Report for 05/20/2020 Allergy List Details Patient Name: Date of Service: [name], [name] 05/20/2020 10:30 A M Medical Record Number: [id number] Patient Account Number: [id number] Date of Birth/Sex: Treating RN: 04-12-1951 (69 y.o. Orvan Falconer Primary Care Gates Jividen: [name] Other Clinician: Referring Vennie Waymire: Treating Rodina Pinales/Extender: Sharma Covert Weeks in Treatment: 0 Allergies Active Allergies atorvastatin enalapril Wilder Glade Kenalog metformin propoxyphene napsylate simvastatin spironolactone Allergy Notes Electronic Signature(s) Signed: 05/20/2020 5:50:45 PM By: Carlene Coria RN Entered By: Carlene Coria on 05/20/2020 11:22:04 -------------------------------------------------------------------------------- Arrival Information Details Patient Name: Date of Service: [name] Patee A. 05/20/2020 10:30 A M Medical Record Number: [id number] Patient Account Number: [id number] Date of Birth/Sex: Treating RN: Jun 09, 1951 (69 y.o. Orvan Falconer Primary Care Militza Devery: [name] Other Clinician: Referring Karly Pitter: Treating Roseland Braun/Extender: Thayer Headings in Treatment: 0 Visit Information Patient Arrived: Ambulatory Arrival Time: 10:53 Accompanied By: self Transfer Assistance: None Patient Identification Verified: Yes Secondary Verification Process Completed: Yes Patient Requires Transmission-Based Precautions: No Patient Has Alerts: No Electronic Signature(s) Signed: 05/20/2020 5:50:45 PM By: Carlene Coria RN Entered By: Carlene Coria on 05/20/2020 10:54:28 -------------------------------------------------------------------------------- Clinic Level of Care Assessment Details Patient Name: Date of Service: [name] 05/20/2020 10:30 A M Medical Record Number: [id number] Patient Account Number: [id number] Date of Birth/Sex: Treating  RN: [birth year] (69 y.o. [name] Primary Care Curby Carswell: [name] Other Clinician: Referring Sarabelle Genson: Treating Vito Beg/Extender: Thayer Headings in Treatment: 0 Clinic Level of Care Assessment Items TOOL 2 Quantity Score X- 1 0 Use when only an EandM is performed on the INITIAL visit ASSESSMENTS - Nursing Assessment / Reassessment X- 1 20 General Physical Exam (combine w/ comprehensive assessment (listed just below) when performed on new pt. evals) X- 1 25 Comprehensive Assessment (HX, ROS, Risk Assessments, Wounds Hx, etc.) ASSESSMENTS - Wound and Skin A ssessment / Reassessment []  - 0 Simple Wound Assessment / Reassessment - one wound X- 2 5 Complex Wound Assessment / Reassessment - multiple wounds []  - 0 Dermatologic / Skin Assessment (not related to wound area) ASSESSMENTS - Ostomy and/or Continence Assessment and Care []  - 0 Incontinence Assessment and Management []  - 0 Ostomy Care Assessment and Management (repouching, etc.) PROCESS - Coordination of Care X - Simple Patient / Family Education for ongoing care 1 15 []  - 0 Complex (extensive) Patient / Family Education for ongoing care X- 1 10 Staff obtains Programmer, systems, Records, T Results / Process Orders est []  - 0 Staff telephones HHA, Nursing Homes / Clarify orders / etc []  - 0 Routine Transfer to another Facility (non-emergent condition) []  - 0 Routine Hospital Admission (non-emergent condition) X- 1 15 New Admissions / Biomedical engineer / Ordering NPWT Apligraf, etc. , []  - 0 Emergency Hospital Admission (emergent condition) X- 1 10 Simple Discharge Coordination []  - 0 Complex (extensive) Discharge Coordination PROCESS - Special Needs []  - 0 Pediatric / Minor Patient Management []  - 0 Isolation Patient Management []  - 0 Hearing / Language / Visual special needs []  - 0 Assessment of Community assistance (transportation, D/C planning, etc.) []  -  0 Additional assistance / Altered mentation []  - 0 Support Surface(s) Assessment (bed, cushion, seat, etc.) INTERVENTIONS - Wound Cleansing / Measurement X- 1 5 Wound Imaging (photographs - any number of wounds) []  - 0 Wound Tracing (instead of photographs) []  - 0 Simple Wound Measurement - one wound X- 2 5 Complex Wound Measurement - multiple wounds []  -  0 Simple Wound Cleansing - one wound X- 2 5 Complex Wound Cleansing - multiple wounds INTERVENTIONS - Wound Dressings X - Small Wound Dressing one or multiple wounds 2 10 []  - 0 Medium Wound Dressing one or multiple wounds []  - 0 Large Wound Dressing one or multiple wounds []  - 0 Application of Medications - injection INTERVENTIONS - Miscellaneous []  - 0 External ear exam X- 1 5 Specimen Collection (cultures, biopsies, blood, body fluids, etc.) X- 1 5 Specimen(s) / Culture(s) sent or taken to Lab for analysis []  - 0 Patient Transfer (multiple staff / Civil Service fast streamer / Similar devices) []  - 0 Simple Staple / Suture removal (25 or less) []  - 0 Complex Staple / Suture removal (26 or more) []  - 0 Hypo / Hyperglycemic Management (close monitor of Blood Glucose) []  - 0 Ankle / Brachial Index (ABI) - do not check if billed separately Has the patient been seen at the hospital within the last three years: Yes Total Score: 160 Level Of Care: New/Established - Level 5 Electronic Signature(s) Signed: 05/20/2020 5:51:53 PM By: Levan Hurst RN, BSN Entered By: Levan Hurst on 05/20/2020 15:05:33 -------------------------------------------------------------------------------- Encounter Discharge Information Details Patient Name: Date of Service: [name] Patee A. 05/20/2020 10:30 A M Medical Record Number: [id number] Patient Account Number: [id number] Date of Birth/Sex: Treating RN: [birth year] (69 y.o. [name] Primary Care Lisaann Atha: [name] Other Clinician: Referring Shamira Toutant: Treating  Sanita Estrada/Extender: Thayer Headings in Treatment: 0 Encounter Discharge Information Items Discharge Condition: Stable Ambulatory Status: Ambulatory Discharge Destination: Home Transportation: Private Auto Accompanied By: self Schedule Follow-up Appointment: Yes Clinical Summary of Care: Electronic Signature(s) Signed: 05/20/2020 6:05:18 PM By: Deon Pilling Entered By: Deon Pilling on 05/20/2020 12:36:01 -------------------------------------------------------------------------------- Multi Wound Chart Details Patient Name: Date of Service: [name] Patee A. 05/20/2020 10:30 A M Medical Record Number: [id number] Patient Account Number: [id number] Date of Birth/Sex: Treating RN: [birth year] (69 y.o. [name] Primary Care Helen Cuff: [name] Other Clinician: Referring Shantee Hayne: Treating Donta Fuster/Extender: Thayer Headings in Treatment: 0 Vital Signs Height(in): 62 Pulse(bpm): 91 Weight(lbs): 172 Blood Pressure(mmHg): 136/77 Body Mass Index(BMI): 31 Temperature(F): 98.4 Respiratory Rate(breaths/min): 18 Photos: [1:No Photos Pubis] [2:No Photos Right Groin] [N/A:N/A N/A] Wound Location: [1:Gradually Appeared] [2:Gradually Appeared] [N/A:N/A] Wounding Event: [1:Abscess] [2:Abscess] [N/A:N/A] Primary Etiology: [1:Congestive Heart Failure, Coronary] [2:Congestive Heart Failure, Coronary] [N/A:N/A] Comorbid History: [1:Artery Disease, Myocardial Infarction, Type II Diabetes 02/04/2020] [2:Artery Disease, Myocardial Infarction, Type II Diabetes 02/04/2020] [N/A:N/A] Date Acquired: [1:0] [2:0] [N/A:N/A] Weeks of Treatment: [1:Open] [2:Open] [N/A:N/A] Wound Status: [1:0.2x0.2x1] [2:0.2x1.5x0.1] [N/A:N/A] Measurements L x W x D (cm) [1:0.031] [2:0.236] [N/A:N/A] A (cm) : rea [1:0.031] [2:0.024] [N/A:N/A] Volume (cm) : [1:0.00%] [2:0.00%] [N/A:N/A] % Reduction in A rea: [1:-933.30%] [2:0.00%] [N/A:N/A] %  Reduction in Volume: [2:12] Position 1 (o'clock): [2:6.5] Maximum Distance 1 (cm): [1:12] Starting Position 1 (o'clock): [1:12] Ending Position 1 (o'clock): [1:1] Maximum Distance 1 (cm): [1:No] [2:Yes] [N/A:N/A] Tunneling: [1:Yes] [2:No] [N/A:N/A] Undermining: [1:Full Thickness Without Exposed] [2:Full Thickness Without Exposed] [N/A:N/A] Classification: [1:Support Structures Large] [2:Support Structures Medium] [N/A:N/A] Exudate Amount: [1:Purulent] [2:Serosanguineous] [N/A:N/A] Exudate Type: [1:yellow, brown, green] [2:red, brown] [N/A:N/A] Exudate Color: [1:Flat and Intact] [2:Well defined, not attached] [N/A:N/A] Wound Margin: [1:Large (67-100%)] [2:Large (67-100%)] [N/A:N/A] Granulation Amount: [1:Red] [2:Pink] [N/A:N/A] Granulation Quality: [1:None Present (0%)] [2:None Present (0%)] [N/A:N/A] Necrotic Amount: [1:Fat Layer (Subcutaneous Tissue): Yes Fat Layer (Subcutaneous Tissue): Yes N/A] Exposed Structures: [1:Fascia: No Tendon: No Muscle: No Joint: No Bone: No None] [2:Fascia:  No Tendon: No Muscle: No Joint: No Bone: No None] [N/A:N/A] Treatment Notes Electronic Signature(s) Signed: 05/20/2020 5:51:53 PM By: Levan Hurst RN, BSN Entered By: Levan Hurst on 05/20/2020 12:20:09 -------------------------------------------------------------------------------- [place] Details Patient Name: Date of Service: [name] Patee A. 05/20/2020 10:30 A M Medical Record Number: [id number] Patient Account Number: [id number] Date of Birth/Sex: Treating RN: 17-Feb-1951 (69 y.o. [name] Primary Care Zebedee Segundo: [name] Other Clinician: Referring Tyanna Hach: Treating Juniper Snyders/Extender: Thayer Headings in Treatment: 0 Active Inactive Nutrition Nursing Diagnoses: Impaired glucose control: actual or potential Potential for alteratiion in Nutrition/Potential for imbalanced nutrition Goals: Patient/caregiver agrees to  and verbalizes understanding of need to use nutritional supplements and/or vitamins as prescribed Date Initiated: 05/20/2020 Target Resolution Date: 06/21/2020 Goal Status: Active Patient/caregiver will maintain therapeutic glucose control Date Initiated: 05/20/2020 Target Resolution Date: 06/21/2020 Goal Status: Active Interventions: Assess HgA1c results as ordered upon admission and as needed Assess patient nutrition upon admission and as needed per policy Provide education on elevated blood sugars and impact on wound healing Provide education on nutrition Notes: Wound/Skin Impairment Nursing Diagnoses: Impaired tissue integrity Knowledge deficit related to ulceration/compromised skin integrity Goals: Patient/caregiver will verbalize understanding of skin care regimen Date Initiated: 05/20/2020 Target Resolution Date: 06/21/2020 Goal Status: Active Ulcer/skin breakdown will have a volume reduction of 30% by week 4 Date Initiated: 05/20/2020 Target Resolution Date: 06/21/2020 Goal Status: Active Interventions: Assess patient/caregiver ability to obtain necessary supplies Assess patient/caregiver ability to perform ulcer/skin care regimen upon admission and as needed Assess ulceration(s) every visit Provide education on ulcer and skin care Notes: Electronic Signature(s) Signed: 05/20/2020 5:51:53 PM By: Levan Hurst RN, BSN Entered By: Levan Hurst on 05/20/2020 14:33:34 -------------------------------------------------------------------------------- Pain Assessment Details Patient Name: Date of Service: [name] Patee A. 05/20/2020 10:30 A M Medical Record Number: [id number] Patient Account Number: [id number] Date of Birth/Sex: Treating RN: 1951-05-04 (69 y.o. Orvan Falconer Primary Care Brityn Mastrogiovanni: [name] Other Clinician: Referring Elif Yonts: Treating Neville Walston/Extender: Thayer Headings in Treatment: 0 Active  Problems Location of Pain Severity and Description of Pain Patient Has Paino Yes Site Locations With Dressing Change: Yes Duration of the Pain. Constant / Intermittento Intermittent How Long Does it Lasto Hours: Minutes: 15 Rate the pain. Current Pain Level: 7 Worst Pain Level: 10 Least Pain Level: 0 Tolerable Pain Level: 5 Character of Pain Describe the Pain: Aching Pain Management and Medication Current Pain Management: Medication: Yes Cold Application: No Rest: Yes Massage: No Activity: No T.E.N.S.: No Heat Application: No Leg drop or elevation: No Is the Current Pain Management Adequate: Inadequate How does your wound impact your activities of daily livingo Sleep: No Bathing: No Appetite: No Relationship With Others: No Bladder Continence: No Emotions: No Bowel Continence: No Work: No Toileting: No Drive: No Dressing: No Hobbies: No Electronic Signature(s) Signed: 05/20/2020 5:50:45 PM By: Carlene Coria RN Entered By: Carlene Coria on 05/20/2020 11:17:13 -------------------------------------------------------------------------------- Patient/Caregiver Education Details Patient Name: Date of Service: [name] 11/15/2021andnbsp10:30 A M Medical Record Number: [id number] Patient Account Number: [id number] Date of Birth/Gender: Treating RN: [birth year] (69 y.o. [name] Primary Care Physician: [name] Other Clinician: Referring Physician: Treating Physician/Extender: Thayer Headings in Treatment: 0 Education Assessment Education Provided To: Patient Education Topics Provided Elevated Blood Sugar/ Impact on Healing: Methods: Explain/Verbal Responses: State content correctly Nutrition: Methods: Explain/Verbal Responses: State content correctly Wound/Skin Impairment: Methods: Explain/Verbal Responses: State content correctly Electronic Signature(s) Signed:  05/20/2020 5:51:53 PM By: Levan Hurst RN, BSN Entered By: Levan Hurst on 05/20/2020 14:34:02 -------------------------------------------------------------------------------- Wound Assessment Details Patient Name: Date of Service: [name] Patee A. 05/20/2020 10:30 A M Medical Record Number: [id number] Patient Account Number: [id number] Date of Birth/Sex: Treating RN: 1951-03-20 (69 y.o. [name] Primary Care Matix Henshaw: [name] Other Clinician: Referring Saher Davee: Treating Dametri Ozburn/Extender: Thayer Headings in Treatment: 0 Wound Status Wound Number: 1 Primary Abscess Etiology: Wound Location: Pubis Wound Open Wounding Event: Gradually Appeared Status: Date Acquired: 02/04/2020 Comorbid Congestive Heart Failure, Coronary Artery Disease, Myocardial Weeks Of Treatment: 0 History: Infarction, Type II Diabetes Clustered Wound: No Wound Measurements Length: (cm) 0.2 Width: (cm) 0.2 Depth: (cm) 1 Area: (cm) 0.031 Volume: (cm) 0.031 % Reduction in Area: 0% % Reduction in Volume: -933.3% Epithelialization: None Tunneling: No Undermining: Yes Starting Position (o'clock): 12 Ending Position (o'clock): 12 Maximum Distance: (cm) 1 Wound Description Classification: Full Thickness Without Exposed Support Structures Wound Margin: Flat and Intact Exudate Amount: Large Exudate Type: Purulent Exudate Color: yellow, brown, green Foul Odor After Cleansing: No Slough/Fibrino Yes Wound Bed Granulation Amount: Large (67-100%) Exposed Structure Granulation Quality: Red Fascia Exposed: No Necrotic Amount: None Present (0%) Fat Layer (Subcutaneous Tissue) Exposed: Yes Tendon Exposed: No Muscle Exposed: No Joint Exposed: No Bone Exposed: No Treatment Notes Wound #1 (Pubis) 1. Cleanse With Wound Cleanser 3. Primary Dressing Applied Calcium Alginate Ag 4. Secondary Dressing Dry Gauze 5. Secured With Medipore tape Notes explained the orders, dressings,  and tests ordered. patient in agreement. Electronic Signature(s) Signed: 05/20/2020 5:51:53 PM By: Levan Hurst RN, BSN Entered By: Levan Hurst on 05/20/2020 12:19:56 -------------------------------------------------------------------------------- Wound Assessment Details Patient Name: Date of Service: [name] Patee A. 05/20/2020 10:30 A M Medical Record Number: [id number] Patient Account Number: [id number] Date of Birth/Sex: Treating RN: February 23, 1951 (69 y.o. Orvan Falconer Primary Care Domenica Weightman: [name] Other Clinician: Referring Bingham Millette: Treating Jermario Kalmar/Extender: Thayer Headings in Treatment: 0 Wound Status Wound Number: 2 Primary Abscess Etiology: Wound Location: Right Groin Wound Open Wounding Event: Gradually Appeared Status: Date Acquired: 02/04/2020 Comorbid Congestive Heart Failure, Coronary Artery Disease, Myocardial Weeks Of Treatment: 0 History: Infarction, Type II Diabetes Clustered Wound: No Wound Measurements Length: (cm) 0.2 Width: (cm) 1.5 Depth: (cm) 0.1 Area: (cm) 0.236 Volume: (cm) 0.024 % Reduction in Area: 0% % Reduction in Volume: 0% Epithelialization: None Tunneling: Yes Position (o'clock): 12 Maximum Distance: (cm) 6.5 Undermining: No Wound Description Classification: Full Thickness Without Exposed Support Structures Wound Margin: Well defined, not attached Exudate Amount: Medium Exudate Type: Serosanguineous Exudate Color: red, brown Foul Odor After Cleansing: No Slough/Fibrino No Wound Bed Granulation Amount: Large (67-100%) Exposed Structure Granulation Quality: Pink Fascia Exposed: No Necrotic Amount: None Present (0%) Fat Layer (Subcutaneous Tissue) Exposed: Yes Tendon Exposed: No Muscle Exposed: No Joint Exposed: No Bone Exposed: No Treatment Notes Wound #2 (Right Groin) 1. Cleanse With Wound Cleanser 3. Primary Dressing Applied Calcium Alginate Ag 4. Secondary  Dressing Dry Gauze 5. Secured With Medipore tape Notes explained the orders, dressings, and tests ordered. patient in agreement. Electronic Signature(s) Signed: 05/20/2020 5:50:45 PM By: Carlene Coria RN Signed: 05/20/2020 5:51:53 PM By: Levan Hurst RN, BSN Entered By: Levan Hurst on 05/20/2020 11:50:52 -------------------------------------------------------------------------------- [place] Details Patient Name: Date of Service: [name] Patee A. 05/20/2020 10:30 A M Medical Record Number: [id number] Patient Account Number: [id number] Date of Birth/Sex: Treating RN: 08/16/1950 (69 y.o. Orvan Falconer Primary Care Teri Legacy: [name]  Other Clinician: Referring Laikynn Pollio: Treating Conroy Goracke/Extender: Thayer Headings in Treatment: 0 Vital Signs Time Taken: 10:54 Temperature (F): 98.4 Height (in): 62 Pulse (bpm): 91 Source: Stated Respiratory Rate (breaths/min): 18 Weight (lbs): 172 Blood Pressure (mmHg): 136/77 Source: Stated Reference Range: 80 - 120 mg / dl Body Mass Index (BMI): 31.5 Electronic Signature(s) Signed: 05/20/2020 5:50:45 PM By: Carlene Coria RN Entered By: Carlene Coria on 05/20/2020 10:55:07

## 2020-05-22 DIAGNOSIS — E785 Hyperlipidemia, unspecified: Secondary | ICD-10-CM | POA: Diagnosis not present

## 2020-05-22 DIAGNOSIS — L02214 Cutaneous abscess of groin: Secondary | ICD-10-CM | POA: Diagnosis not present

## 2020-05-22 DIAGNOSIS — I252 Old myocardial infarction: Secondary | ICD-10-CM | POA: Diagnosis not present

## 2020-05-22 DIAGNOSIS — B952 Enterococcus as the cause of diseases classified elsewhere: Secondary | ICD-10-CM | POA: Diagnosis not present

## 2020-05-22 DIAGNOSIS — Z85038 Personal history of other malignant neoplasm of large intestine: Secondary | ICD-10-CM | POA: Diagnosis not present

## 2020-05-22 DIAGNOSIS — I5040 Unspecified combined systolic (congestive) and diastolic (congestive) heart failure: Secondary | ICD-10-CM | POA: Diagnosis not present

## 2020-05-22 DIAGNOSIS — I130 Hypertensive heart and chronic kidney disease with heart failure and stage 1 through stage 4 chronic kidney disease, or unspecified chronic kidney disease: Secondary | ICD-10-CM | POA: Diagnosis not present

## 2020-05-22 DIAGNOSIS — N390 Urinary tract infection, site not specified: Secondary | ICD-10-CM | POA: Diagnosis not present

## 2020-05-22 DIAGNOSIS — I2510 Atherosclerotic heart disease of native coronary artery without angina pectoris: Secondary | ICD-10-CM | POA: Diagnosis not present

## 2020-05-22 DIAGNOSIS — D631 Anemia in chronic kidney disease: Secondary | ICD-10-CM | POA: Diagnosis not present

## 2020-05-22 DIAGNOSIS — Z7982 Long term (current) use of aspirin: Secondary | ICD-10-CM | POA: Diagnosis not present

## 2020-05-22 DIAGNOSIS — K219 Gastro-esophageal reflux disease without esophagitis: Secondary | ICD-10-CM | POA: Diagnosis not present

## 2020-05-22 DIAGNOSIS — J449 Chronic obstructive pulmonary disease, unspecified: Secondary | ICD-10-CM | POA: Diagnosis not present

## 2020-05-22 DIAGNOSIS — N184 Chronic kidney disease, stage 4 (severe): Secondary | ICD-10-CM | POA: Diagnosis not present

## 2020-05-22 DIAGNOSIS — Z8673 Personal history of transient ischemic attack (TIA), and cerebral infarction without residual deficits: Secondary | ICD-10-CM | POA: Diagnosis not present

## 2020-05-22 DIAGNOSIS — E1122 Type 2 diabetes mellitus with diabetic chronic kidney disease: Secondary | ICD-10-CM | POA: Diagnosis not present

## 2020-05-22 DIAGNOSIS — I255 Ischemic cardiomyopathy: Secondary | ICD-10-CM | POA: Diagnosis not present

## 2020-05-22 DIAGNOSIS — F17210 Nicotine dependence, cigarettes, uncomplicated: Secondary | ICD-10-CM | POA: Diagnosis not present

## 2020-05-22 DIAGNOSIS — L03314 Cellulitis of groin: Secondary | ICD-10-CM | POA: Diagnosis not present

## 2020-05-22 DIAGNOSIS — D509 Iron deficiency anemia, unspecified: Secondary | ICD-10-CM | POA: Diagnosis not present

## 2020-05-22 NOTE — Progress Notes (Signed)
Outbound call to patient advising results. Patient verbalized understanding.

## 2020-05-24 DIAGNOSIS — Z8673 Personal history of transient ischemic attack (TIA), and cerebral infarction without residual deficits: Secondary | ICD-10-CM | POA: Diagnosis not present

## 2020-05-24 DIAGNOSIS — D509 Iron deficiency anemia, unspecified: Secondary | ICD-10-CM | POA: Diagnosis not present

## 2020-05-24 DIAGNOSIS — I255 Ischemic cardiomyopathy: Secondary | ICD-10-CM | POA: Diagnosis not present

## 2020-05-24 DIAGNOSIS — E785 Hyperlipidemia, unspecified: Secondary | ICD-10-CM | POA: Diagnosis not present

## 2020-05-24 DIAGNOSIS — K219 Gastro-esophageal reflux disease without esophagitis: Secondary | ICD-10-CM | POA: Diagnosis not present

## 2020-05-24 DIAGNOSIS — I2510 Atherosclerotic heart disease of native coronary artery without angina pectoris: Secondary | ICD-10-CM | POA: Diagnosis not present

## 2020-05-24 DIAGNOSIS — Z7982 Long term (current) use of aspirin: Secondary | ICD-10-CM | POA: Diagnosis not present

## 2020-05-24 DIAGNOSIS — F17210 Nicotine dependence, cigarettes, uncomplicated: Secondary | ICD-10-CM | POA: Diagnosis not present

## 2020-05-24 DIAGNOSIS — D631 Anemia in chronic kidney disease: Secondary | ICD-10-CM | POA: Diagnosis not present

## 2020-05-24 DIAGNOSIS — Z85038 Personal history of other malignant neoplasm of large intestine: Secondary | ICD-10-CM | POA: Diagnosis not present

## 2020-05-24 DIAGNOSIS — N184 Chronic kidney disease, stage 4 (severe): Secondary | ICD-10-CM | POA: Diagnosis not present

## 2020-05-24 DIAGNOSIS — I5040 Unspecified combined systolic (congestive) and diastolic (congestive) heart failure: Secondary | ICD-10-CM | POA: Diagnosis not present

## 2020-05-24 DIAGNOSIS — L03314 Cellulitis of groin: Secondary | ICD-10-CM | POA: Diagnosis not present

## 2020-05-24 DIAGNOSIS — I252 Old myocardial infarction: Secondary | ICD-10-CM | POA: Diagnosis not present

## 2020-05-24 DIAGNOSIS — J449 Chronic obstructive pulmonary disease, unspecified: Secondary | ICD-10-CM | POA: Diagnosis not present

## 2020-05-24 DIAGNOSIS — L02214 Cutaneous abscess of groin: Secondary | ICD-10-CM | POA: Diagnosis not present

## 2020-05-24 DIAGNOSIS — I130 Hypertensive heart and chronic kidney disease with heart failure and stage 1 through stage 4 chronic kidney disease, or unspecified chronic kidney disease: Secondary | ICD-10-CM | POA: Diagnosis not present

## 2020-05-24 DIAGNOSIS — E1122 Type 2 diabetes mellitus with diabetic chronic kidney disease: Secondary | ICD-10-CM | POA: Diagnosis not present

## 2020-05-24 DIAGNOSIS — N390 Urinary tract infection, site not specified: Secondary | ICD-10-CM | POA: Diagnosis not present

## 2020-05-24 DIAGNOSIS — B952 Enterococcus as the cause of diseases classified elsewhere: Secondary | ICD-10-CM | POA: Diagnosis not present

## 2020-05-24 LAB — AEROBIC CULTURE W GRAM STAIN (SUPERFICIAL SPECIMEN)

## 2020-05-27 ENCOUNTER — Other Ambulatory Visit: Payer: Self-pay

## 2020-05-27 ENCOUNTER — Encounter (HOSPITAL_BASED_OUTPATIENT_CLINIC_OR_DEPARTMENT_OTHER): Payer: Medicare Other | Admitting: Physician Assistant

## 2020-05-27 DIAGNOSIS — I700 Atherosclerosis of aorta: Secondary | ICD-10-CM | POA: Diagnosis not present

## 2020-05-27 DIAGNOSIS — L02211 Cutaneous abscess of abdominal wall: Secondary | ICD-10-CM | POA: Diagnosis not present

## 2020-05-27 DIAGNOSIS — I2510 Atherosclerotic heart disease of native coronary artery without angina pectoris: Secondary | ICD-10-CM | POA: Diagnosis not present

## 2020-05-27 DIAGNOSIS — E11628 Type 2 diabetes mellitus with other skin complications: Secondary | ICD-10-CM | POA: Diagnosis not present

## 2020-05-27 DIAGNOSIS — I509 Heart failure, unspecified: Secondary | ICD-10-CM | POA: Diagnosis not present

## 2020-05-27 DIAGNOSIS — L02214 Cutaneous abscess of groin: Secondary | ICD-10-CM | POA: Diagnosis not present

## 2020-05-27 DIAGNOSIS — L03314 Cellulitis of groin: Secondary | ICD-10-CM | POA: Diagnosis not present

## 2020-05-27 NOTE — Progress Notes (Addendum)
CARIE, KAPUSCINSKI ([id number]) Visit Report for 05/27/2020 Chief Complaint Document Details Patient Name: Date of Service: [name], [name] 05/27/2020 2:15 PM Medical Record Number: [id number] Patient Account Number: [id number] Date of Birth/Sex: Treating RN: 07-Aug-1950 (69 y.o. [name] Primary Care Provider: [name] Other Clinician: Referring Provider: Treating Provider/Extender: Guinevere Scarlet, Vivi Ferns in Treatment: 1 Information Obtained from: Patient Chief Complaint 05/20/2020; patient is here for review of abscesses involving her abdomen, symphysis pubis area and groin. Complicated medical history Electronic Signature(s) Signed: 05/27/2020 2:56:26 PM By: Worthy Keeler PA-C Entered By: Worthy Keeler on 05/27/2020 14:56:25 -------------------------------------------------------------------------------- HPI Details Patient Name: Date of Service: [name] Patee A. 05/27/2020 2:15 PM Medical Record Number: [id number] Patient Account Number: [id number] Date of Birth/Sex: Treating RN: 11-01-1950 (69 y.o. [name] Primary Care Provider: [name] Other Clinician: Referring Provider: Treating Provider/Extender: Guinevere Scarlet, Vivi Ferns in Treatment: 1 History of Present Illness HPI Description: 04/19/2020 ADMISSION this is a patient who became acutely ill with sepsis in late March. She required admission to hospital from 03/05/2020 through 9//9/21 with abscesses in her groin. She was septic with Enterococcus faecalis she is treated initially with Vanco and Zosyn but discharged on amoxicillin. She required surgical debridement of 3 areas including 1 in the anterior thigh and 2 on the right buttock. She was discharged to [facility]. 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 She was seen by plastic surgery  including Dr. Heber [place] and then by Phoebe Sharps one of their PAs twice. At that point they described the areas on the right inner groin and then 3 new abdominal wounds. The last culture I can see done by plastic surgery on 11/3 showed both Klebsiella and Enterobacter. She was on Bactrim for a. And more recently is now on Amoxil presumably for a Streptococcus species on 11/2. She is still taking Amoxil. She has not been feeling systemically unwell no fever no nausea she is back at home. She has home health putting calcium alginate on the wounds. I have included the original CT scan report before her surgery. I am not completely certain that the extent of this was appreciated. This may have accounted for why she developed recurrent areas after discharge from our hospital on her lower abdomen which is clearly commented on in the CT scan report. I am not sure I could paraphrase this any better ADDENDUM REPORT 03/06/2020 21:05 : ADDENDUM: Critical Value/emergent results were called by telephone at the time of interpretation on 03/06/2020 at 9:05 pm to provider Sharlet Salina NP , who verbally acknowledged these results. Electronically Signed By: Lovena Le M.D. On: 03/06/2020 21:05 Signed by Briant Cedar, MD on 03/06/2020 9:08 PM Narrative and Impression CLINICAL DATA: Abdominal pain, abdominal and groin cellulitis EXAM: CT ABDOMEN AND PELVIS WITHOUT CONTRAST TECHNIQUE: Multidetector CT imaging of the abdomen and pelvis was performed following the standard protocol without IV contrast. COMPARISON: None. FINDINGS: Lower chest: Lung bases are clear. Cardiac size at the upper limits normal. Coronary artery calcifications are present. Suspect calcification of the mitral annulus as well. Hypoattenuation of the cardiac blood pool may reflect a mild anemia. Hepatobiliary: No visible concerning liver lesion within the limitations of this unenhanced of motion degraded CT Smooth liver . surface contour.  Normal liver attenuation. Gallbladder is unremarkable. Question some biliary ductal dilatation difficult to fully discern  given the lack of contrast media and extensive motion artifact. The extrahepatic common bile duct may measure up to 1.5 cm at the level of the pancreatic head. No visible intraductal gallstones. Pancreas: Poorly assessed given extensive motion artifact photon starvation. No pancreatic ductal dilatation or surrounding inflammatory changes. Spleen: Normal in size. No concerning splenic lesions. Splenic hilar vascular calcifications are noted. Adrenals/Urinary Tract: Adrenal glands are unremarkable. Kidneys are symmetric in size and normally located. Question some mild perinephric stranding versus volume averaging in the setting of motion artifact. No visible concerning renal lesion. No urolithiasis or hydronephrosis. Urinary bladder is unremarkable. Stomach/Bowel: Small sliding-type hiatal hernia. Distal stomach and duodenum are unremarkable. No small or large bowel thickening or dilatation. No evidence of bowel obstruction. Appendix is not visualized. No focal inflammation the vicinity of the cecum to suggest an occult appendicitis. Vascular/Lymphatic: Atherosclerotic calcifications within the abdominal aorta and branch vessels. No aneurysm or ectasia. No enlarged abdominopelvic lymph nodes. Likely reactive adenopathy seen in the inguinal regions. Reproductive: Anteverted uterus. Question endometrial thickening, greater than expected for patient age measuring up to 10 mm in struck thickness. No concerning adnexal lesions. Few parametrial calcifications are atypical senescent finding Other: There is extensive soft tissue stranding and thickening extending across the low anterior abdominal wall/panniculus. Large lobular phlegmon in the right groin/pudendal tissues superficial to the right adductor musculature and right inferior pubic ramus anteriorly with small  amount of soft tissue gas. Is region is difficult to accurately measure given the lobular margins of med does extend up to 4.3 x 6.2 cm in maximal transaxial dimensions. Organized collection/abscess formation is difficult to ascertain in the absence of contrast media. Musculoskeletal: Soft tissue thickening and edematous changes of the adductor compartment of the right thigh could reflect some reactive or inflammatory myositis. No discernible intramuscular collection is seen. No acute or suspicious osseous lesions. Multilevel degenerative changes in the spine, hips and pelvis. Mild grade 1 anterolisthesis L4 on 5. No spondylolysis is evident. IMPRESSION: 1. Extensive soft tissue stranding and thickening extending across the low anterior abdominal wall/panniculus with a large, heterogeneous phlegmon within the deep space of the right groin and pudendal soft tissues along the right adductor musculature and right inferior pubic ramus with small amount of soft tissue gas. Organized collection/abscess formation is difficult to ascertain in the absence of contrast media. Findings are concerning for severe cellulitis and aggressive soft tissue infection. Emergent surgical consultation is warranted. 2. Soft tissue thickening and edematous changes of the adductor compartment of the right thigh could reflect some reactive or inflammatory myositis. No discernible intramuscular collection is seen. 3. Question endometrial thickening, greater than expected for patient age measuring up to 10 mm in thickness. Recommend further evaluation with outpatient pelvic ultrasound when patient is able to better tolerate. 4. Aortic Atherosclerosis (ICD10-I70.0). Currently attempting to contact the ordering provider with a critical value result. Addendum will be submitted upon case discussion. Electronically Signed: By: Lovena Le M.D. On: 03/06/2020 21:02 05/27/2020 upon evaluation today patient appears to  be doing better in my opinion with regard to some of the abscess areas currently. She has her CT scan coming up shortly over the next week. With that being said she has been on Augmentin 500 mg which has seemed to help her to be honest. We did have a culture as well that was obtained at the last visit and showed Streptococcus anginosus with that being said I do believe that it may be helpful for [place] to go ahead and extend  her Augmentin which is on Thursday for another 2 weeks just to make sure that we keep things under control since she does seem to be doing better the patient is in agreement with that plan. Electronic Signature(s) Signed: 05/27/2020 3:53:20 PM By: Worthy Keeler PA-C Entered By: Worthy Keeler on 05/27/2020 15:53:19 -------------------------------------------------------------------------------- Physical Exam Details Patient Name: Date of Service: KACEY, DYSERT 05/27/2020 2:15 PM Medical Record Number: [id number] Patient Account Number: [id number] Date of Birth/Sex: Treating RN: 07-Jul-1950 (69 y.o. [name] Primary Care Provider: [name] Other Clinician: Referring Provider: Treating Provider/Extender: Guinevere Scarlet, Vivi Ferns in Treatment: 1 Constitutional Well-nourished and well-hydrated in no acute distress. Respiratory normal breathing without difficulty. Psychiatric this patient is able to make decisions and demonstrates good insight into disease process. Alert and Oriented x 3. pleasant and cooperative. Notes Upon inspection patient's wound bed actually showed signs of good 2 areas which are still open at this time. There does not appear to be any signs of active infection which is great news. In general I am very pleased with where things stand currently especially when it comes down to the amount of drainage she seems to be experiencing. With that being said I do believe that we need to probably continue the Augmentin  for the time being at least until we get the results of her CT scan back and see where things stand in this regard. Electronic Signature(s) Signed: 05/27/2020 3:54:15 PM By: Worthy Keeler PA-C Entered By: Worthy Keeler on 05/27/2020 15:54:14 -------------------------------------------------------------------------------- Physician Orders Details Patient Name: Date of Service: [name] Patee A. 05/27/2020 2:15 PM Medical Record Number: [id number] Patient Account Number: [id number] Date of Birth/Sex: Treating RN: 1951-06-21 (69 y.o. 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 Dressing Change Frequency Wound #1 Pubis Change dressing three times week. - by home health Wound #2 Right Groin Change dressing three times week. - by home health Wound Cleansing May shower and wash wound with soap and water. - on days that dressing is changed Primary Wound Dressing Wound #1 Pubis Calcium Alginate with Silver Wound #2 Right Groin Calcium Alginate with Silver Secondary Dressing Wound #1 Pubis Dry Gauze - secure with tape Wound #2 Right Groin Dry Gauze - secure with tape [place] skilled nursing for wound care. - [facility] Patient Medications llergies: atorvastatin, enalapril, Farxiga, Kenalog, metformin, propoxyphene napsylate, simvastatin, spironolactone A Notifications Medication Indication Start End 05/27/2020 Augmentin DOSE 1 - oral 875 mg-125 mg tablet - 1 tablet oral taken 2 times per day for 14 days Electronic Signature(s) Signed:  05/27/2020 3:55:38 PM By: Worthy Keeler PA-C Entered By: Worthy Keeler on 05/27/2020 15:55:38 -------------------------------------------------------------------------------- Problem List Details Patient Name: Date of Service: [name] Patee A. 05/27/2020 2:15 PM Medical Record Number: [id number] Patient Account Number: [id number] Date of Birth/Sex: Treating RN: December 19, 1950 (69 y.o. 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 05/27/2020 2:15 PM Medical Record Number: [id number] Patient Account Number: [id number] Date of Birth/Sex: Treating RN: [birth year] (69 y.o. [name] Primary Care Provider: [name] Other Clinician: Referring Provider: Treating Provider/Extender: Guinevere Scarlet, Vivi Ferns in Treatment: 1 Subjective Chief Complaint Information obtained from Patient 05/20/2020; patient is here for review of abscesses involving her abdomen, symphysis pubis area and groin. Complicated medical history History of Present Illness (HPI) 04/19/2020 ADMISSION this is a patient who became acutely ill with sepsis in late March. She required admission to hospital from 03/05/2020 through 9//9/21 with abscesses in her  groin. She was septic with Enterococcus faecalis she is treated initially with Vanco and Zosyn but discharged on amoxicillin. She required surgical debridement of 3 areas including 1 in the anterior thigh and 2 on the right buttock. She was discharged to [facility]. 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 She was seen by plastic surgery including Dr. Heber [place] and then by Phoebe Sharps one of their PAs twice. At that point they described the areas on the right inner groin and then 3 new abdominal wounds. The last culture I can see done by plastic surgery on 11/3 showed both Klebsiella and Enterobacter. She was on Bactrim for a. And more recently is now on Amoxil presumably for a Streptococcus species on 11/2. She is still taking Amoxil. She has not been feeling systemically unwell no fever no nausea she is back at home. She has home health putting calcium alginate on the wounds. I have included the original CT scan report before her surgery. I am not completely certain that the extent of this was appreciated. This may have accounted for why she developed recurrent areas after discharge from our hospital on her lower abdomen which is clearly commented on in the CT scan report. I am not sure I could paraphrase this any better ADDENDUM REPORT 03/06/2020 21:05 : ADDENDUM: Critical Value/emergent results were called by telephone at the time of interpretation on 03/06/2020 at 9:05 pm to provider Sharlet Salina NP , who verbally acknowledged these results. Electronically Signed By: Lovena Le M.D. On: 03/06/2020 21:05 Signed by Briant Cedar, MD on 03/06/2020 9:08 PM Narrative and Impression CLINICAL DATA: Abdominal pain, abdominal and groin cellulitis EXAM: CT ABDOMEN AND PELVIS WITHOUT CONTRAST TECHNIQUE: Multidetector CT imaging of the abdomen and pelvis was performed following the standard protocol without IV  contrast. COMPARISON: None. FINDINGS: Lower chest: Lung bases are clear. Cardiac size at the upper limits normal. Coronary artery calcifications are present. Suspect calcification of the mitral annulus as well. Hypoattenuation of the cardiac blood pool may reflect a mild anemia. Hepatobiliary: No visible concerning liver lesion within the limitations of this unenhanced of motion degraded CT Smooth liver . surface contour. Normal liver attenuation. Gallbladder is unremarkable. Question some biliary ductal dilatation difficult to fully discern given the lack of contrast media and extensive motion artifact. The extrahepatic common bile duct may measure up to 1.5 cm at the level of the pancreatic head. No visible intraductal gallstones. Pancreas: Poorly assessed given extensive motion artifact photon starvation. No pancreatic ductal  dilatation or surrounding inflammatory changes. Spleen: Normal in size. No concerning splenic lesions. Splenic hilar vascular calcifications are noted. Adrenals/Urinary Tract: Adrenal glands are unremarkable. Kidneys are symmetric in size and normally located. Question some mild perinephric stranding versus volume averaging in the setting of motion artifact. No visible concerning renal lesion. No urolithiasis or hydronephrosis. Urinary bladder is unremarkable. Stomach/Bowel: Small sliding-type hiatal hernia. Distal stomach and duodenum are unremarkable. No small or large bowel thickening or dilatation. No evidence of bowel obstruction. Appendix is not visualized. No focal inflammation the vicinity of the cecum to suggest an occult appendicitis. Vascular/Lymphatic: Atherosclerotic calcifications within the abdominal aorta and branch vessels. No aneurysm or ectasia. No enlarged abdominopelvic lymph nodes. Likely reactive adenopathy seen in the inguinal regions. Reproductive: Anteverted uterus. Question endometrial thickening, greater than expected for  patient age measuring up to 10 mm in struck thickness. No concerning adnexal lesions. Few parametrial calcifications are atypical senescent finding Other: There is extensive soft tissue stranding and thickening extending across the low anterior abdominal wall/panniculus. Large lobular phlegmon in the right groin/pudendal tissues superficial to the right adductor musculature and right inferior pubic ramus anteriorly with small amount of soft tissue gas. Is region is difficult to accurately measure given the lobular margins of med does extend up to 4.3 x 6.2 cm in maximal transaxial dimensions. Organized collection/abscess formation is difficult to ascertain in the absence of contrast media. Musculoskeletal: Soft tissue thickening and edematous changes of the adductor compartment of the right thigh could reflect some reactive or inflammatory myositis. No discernible intramuscular collection is seen. No acute or suspicious osseous lesions. Multilevel degenerative changes in the spine, hips and pelvis. Mild grade 1 anterolisthesis L4 on 5. No spondylolysis is evident. IMPRESSION: 1. Extensive soft tissue stranding and thickening extending across the low anterior abdominal wall/panniculus with a large, heterogeneous phlegmon within the deep space of the right groin and pudendal soft tissues along the right adductor musculature and right inferior pubic ramus with small amount of soft tissue gas. Organized collection/abscess formation is difficult to ascertain in the absence of contrast media. Findings are concerning for severe cellulitis and aggressive soft tissue infection. Emergent surgical consultation is warranted. 2. Soft tissue thickening and edematous changes of the adductor compartment of the right thigh could reflect some reactive or inflammatory myositis. No discernible intramuscular collection is seen. 3. Question endometrial thickening, greater than expected for patient age  measuring up to 10 mm in thickness. Recommend further evaluation with outpatient pelvic ultrasound when patient is able to better tolerate. 4. Aortic Atherosclerosis (ICD10-I70.0). Currently attempting to contact the ordering provider with a critical value result. Addendum will be submitted upon case discussion. Electronically Signed: By: Lovena Le M.D. On: 03/06/2020 21:02 05/27/2020 upon evaluation today patient appears to be doing better in my opinion with regard to some of the abscess areas currently. She has her CT scan coming up shortly over the next week. With that being said she has been on Augmentin 500 mg which has seemed to help her to be honest. We did have a culture as well that was obtained at the last visit and showed Streptococcus anginosus with that being said I do believe that it may be helpful for [place] to go ahead and extend her Augmentin which is on Thursday for another 2 weeks just to make sure that we keep things under control since she does seem to be doing better the patient is in agreement with that plan. Objective Constitutional Well-nourished and well-hydrated in no acute  distress. Vitals Time Taken: 2:56 PM, Height: 62 in, Source: Stated, Weight: 172 lbs, Source: Stated, BMI: 31.5, Temperature: 98.3 F, Pulse: 80 bpm, Respiratory Rate: 18 breaths/min, Blood Pressure: 135/77 mmHg. Respiratory normal breathing without difficulty. Psychiatric this patient is able to make decisions and demonstrates good insight into disease process. Alert and Oriented x 3. pleasant and cooperative. General Notes: Upon inspection patient's wound bed actually showed signs of good 2 areas which are still open at this time. There does not appear to be any signs of active infection which is great news. In general I am very pleased with where things stand currently especially when it comes down to the amount of drainage she seems to be experiencing. With that being said I do believe that  we need to probably continue the Augmentin for the time being at least until we get the results of her CT scan back and see where things stand in this regard. Integumentary (Hair, Skin) Wound #1 status is Open. Original cause of wound was Gradually Appeared. The wound is located on the Pubis. The wound measures 0.2cm length x 0.2cm width x 3.9cm depth; 0.031cm^2 area and 0.123cm^3 volume. There is Fat Layer (Subcutaneous Tissue) exposed. There is no tunneling or undermining noted. There is a medium amount of purulent drainage noted. The wound margin is flat and intact. There is large (67-100%) red granulation within the wound bed. There is no necrotic tissue within the wound bed. Wound #2 status is Open. Original cause of wound was Gradually Appeared. The wound is located on the Right Groin. The wound measures 0.1cm length x 0.1cm width x 1cm depth; 0.008cm^2 area and 0.008cm^3 volume. There is no tunneling or undermining noted. There is a none present amount of drainage noted. The wound margin is well defined and not attached to the wound base. There is no granulation within the wound bed. There is no necrotic tissue within the wound bed. 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 Dressing Change Frequency: Wound #1 Pubis: Change dressing three times week. - by home health Wound #2 Right Groin: Change dressing three times week. - by home health Wound Cleansing: May shower and wash wound with soap and water. - on days that dressing is changed Primary Wound Dressing: Wound #1 Pubis: Calcium Alginate with Silver Wound #2 Right Groin: Calcium Alginate with Silver Secondary Dressing: Wound #1 Pubis: Dry Gauze - secure with tape Wound #2 Right Groin: Dry Gauze - secure with tape Home Health: [facility]  skilled nursing for wound care. - [facility] The following medication(s) was prescribed: Augmentin oral 875 mg-125 mg tablet 1 1 tablet oral taken 2 times per day for 14 days starting 05/27/2020 1. Would recommend currently that we go ahead and give her a prescription for Augmentin 875/125 mg. The patient is in agreement with that plan. She has been taking this without complication currently. 2. I am also going to recommend at this time that we have the patient continue with her current dressings. This is a silver alginate dressing that she is putting over the areas. 3. She also has her CT scan scheduled shortly and we will see what this shows if there is any evidence of abscesses deeper and at this point or what exactly may be going on at this time. We will see patient back for reevaluation in 2 weeks here in the  clinic. If anything worsens or changes patient will contact our office for additional recommendations. Electronic Signature(s) Signed: 05/27/2020 3:57:37 PM By: Worthy Keeler PA-C Entered By: Worthy Keeler on 05/27/2020 15:57:37 -------------------------------------------------------------------------------- SuperBill Details Patient Name: Date of Service: [name] Patee A. 05/27/2020 Medical Record Number: [id number] Patient Account Number: [id number] Date of Birth/Sex: Treating RN: 09-Jan-1951 (69 y.o. 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 Facility Procedures CPT4 Code: [id number] Description: 99214 - WOUND CARE VISIT-LEV 4 EST PT Modifier: Quantity: 1 Physician Procedures : CPT4 Code Description Modifier [id number] - WC PHYS LEVEL 4 -  EST PT ICD-10 Diagnosis Description L02.211 Cutaneous abscess of abdominal wall L02.214 Cutaneous abscess of groin 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

## 2020-05-27 NOTE — Progress Notes (Signed)
[name], [name] ([id number]) Visit Report for 05/27/2020 Arrival Information Details Patient Name: Date of Service: [name], [name] 05/27/2020 2:15 PM Medical Record Number: [id number] Patient Account Number: [id number] Date of Birth/Sex: Treating RN: June 07, 1951 (69 y.o. Elam Dutch Primary Care Arzell Mcgeehan: [name] Other Clinician: Referring Chimaobi Casebolt: Treating Tasheem Elms/Extender: Charlestine Massed in Treatment: 1 Visit Information History Since Last Visit Added or deleted any medications: No Patient Arrived: Ambulatory Any new allergies or adverse reactions: No Arrival Time: 14:48 Had a fall or experienced change in No Accompanied By: self activities of daily living that may affect Transfer Assistance: None risk of falls: Patient Identification Verified: Yes Signs or symptoms of abuse/neglect since last visito No Secondary Verification Process Completed: Yes Hospitalized since last visit: No Patient Requires Transmission-Based Precautions: No Implantable device outside of the clinic excluding No Patient Has Alerts: No cellular tissue based products placed in the center since last visit: Has Dressing in Place as Prescribed: Yes Pain Present Now: No Electronic Signature(s) Signed: 05/27/2020 4:59:01 PM By: Baruch Gouty RN, BSN Entered By: Baruch Gouty on 05/27/2020 14:53:44 -------------------------------------------------------------------------------- Clinic Level of Care Assessment Details Patient Name: Date of Service: [name], [name] A. 05/27/2020 2:15 PM Medical Record Number: [id number] Patient Account Number: [id number] Date of Birth/Sex: Treating RN: May 14, 1951 (69 y.o. [name] Primary Care Makaylah Oddo: [name] Other Clinician: Referring Eisa Conaway: Treating Parminder Trapani/Extender: Charlestine Massed in Treatment: 1 Clinic Level of Care Assessment Items TOOL 4 Quantity  Score X- 1 0 Use when only an EandM is performed on FOLLOW-UP visit ASSESSMENTS - Nursing Assessment / Reassessment X- 1 10 Reassessment of Co-morbidities (includes updates in patient status) X- 1 5 Reassessment of Adherence to Treatment Plan ASSESSMENTS - Wound and Skin A ssessment / Reassessment []  - 0 Simple Wound Assessment / Reassessment - one wound X- 2 5 Complex Wound Assessment / Reassessment - multiple wounds []  - 0 Dermatologic / Skin Assessment (not related to wound area) ASSESSMENTS - Focused Assessment []  - 0 Circumferential Edema Measurements - multi extremities []  - 0 Nutritional Assessment / Counseling / Intervention []  - 0 Lower Extremity Assessment (monofilament, tuning fork, pulses) []  - 0 Peripheral Arterial Disease Assessment (using hand held doppler) ASSESSMENTS - Ostomy and/or Continence Assessment and Care []  - 0 Incontinence Assessment and Management []  - 0 Ostomy Care Assessment and Management (repouching, etc.) PROCESS - Coordination of Care X - Simple Patient / Family Education for ongoing care 1 15 []  - 0 Complex (extensive) Patient / Family Education for ongoing care X- 1 10 Staff obtains Programmer, systems, Records, T Results / Process Orders est X- 1 10 Staff telephones HHA, Nursing Homes / Clarify orders / etc []  - 0 Routine Transfer to another Facility (non-emergent condition) []  - 0 Routine Hospital Admission (non-emergent condition) []  - 0 New Admissions / Biomedical engineer / Ordering NPWT Apligraf, etc. , []  - 0 Emergency Hospital Admission (emergent condition) X- 1 10 Simple Discharge Coordination []  - 0 Complex (extensive) Discharge Coordination PROCESS - Special Needs []  - 0 Pediatric / Minor Patient Management []  - 0 Isolation Patient Management []  - 0 Hearing / Language / Visual special needs []  - 0 Assessment of Community assistance (transportation, D/C planning, etc.) []  - 0 Additional assistance / Altered  mentation []  - 0 Support Surface(s) Assessment (bed, cushion, seat, etc.) INTERVENTIONS - Wound Cleansing / Measurement []  - 0 Simple Wound Cleansing - one wound X- 2 5 Complex Wound  Cleansing - multiple wounds X- 1 5 Wound Imaging (photographs - any number of wounds) []  - 0 Wound Tracing (instead of photographs) []  - 0 Simple Wound Measurement - one wound X- 2 5 Complex Wound Measurement - multiple wounds INTERVENTIONS - Wound Dressings X - Small Wound Dressing one or multiple wounds 2 10 []  - 0 Medium Wound Dressing one or multiple wounds []  - 0 Large Wound Dressing one or multiple wounds []  - 0 Application of Medications - topical []  - 0 Application of Medications - injection INTERVENTIONS - Miscellaneous []  - 0 External ear exam []  - 0 Specimen Collection (cultures, biopsies, blood, body fluids, etc.) []  - 0 Specimen(s) / Culture(s) sent or taken to Lab for analysis []  - 0 Patient Transfer (multiple staff / Harrel Lemon Lift / Similar devices) []  - 0 Simple Staple / Suture removal (25 or less) []  - 0 Complex Staple / Suture removal (26 or more) []  - 0 Hypo / Hyperglycemic Management (close monitor of Blood Glucose) []  - 0 Ankle / Brachial Index (ABI) - do not check if billed separately X- 1 5 Vital Signs Has the patient been seen at the hospital within the last three years: Yes Total Score: 120 Level Of Care: New/Established - Level 4 Electronic Signature(s) Signed: 05/27/2020 5:46:54 PM By: Levan Hurst RN, BSN Entered By: Levan Hurst on 05/27/2020 17:43:36 -------------------------------------------------------------------------------- Lower Extremity Assessment Details Patient Name: Date of Service: [name] Patee A. 05/27/2020 2:15 PM Medical Record Number: [id number] Patient Account Number: [id number] Date of Birth/Sex: Treating RN: [birth year] (69 y.o. Elam Dutch Primary Care Leanah Kolander: [name] Other Clinician: Referring  Berenice Oehlert: Treating Jamian Andujo/Extender: Guinevere Scarlet, Vivi Ferns in Treatment: 1 Electronic Signature(s) Signed: 05/27/2020 4:59:01 PM By: Baruch Gouty RN, BSN Entered By: Baruch Gouty on 05/27/2020 14:56:57 -------------------------------------------------------------------------------- Multi-Disciplinary Care Plan Details Patient Name: Date of Service: [name] Patee A. 05/27/2020 2:15 PM Medical Record Number: [id number] Patient Account Number: [id number] Date of Birth/Sex: Treating RN: 1950-10-26 (69 y.o. [name] Primary Care Eulia Hatcher: [name] Other Clinician: Referring Raevin Wierenga: Treating Lily Kernen/Extender: Charlestine Massed in Treatment: 1 Active Inactive Nutrition Nursing Diagnoses: Impaired glucose control: actual or potential Potential for alteratiion in Nutrition/Potential for imbalanced nutrition Goals: Patient/caregiver agrees to and verbalizes understanding of need to use nutritional supplements and/or vitamins as prescribed Date Initiated: 05/20/2020 Target Resolution Date: 06/21/2020 Goal Status: Active Patient/caregiver will maintain therapeutic glucose control Date Initiated: 05/20/2020 Target Resolution Date: 06/21/2020 Goal Status: Active Interventions: Assess HgA1c results as ordered upon admission and as needed Assess patient nutrition upon admission and as needed per policy Provide education on elevated blood sugars and impact on wound healing Provide education on nutrition Treatment Activities: Education provided on Nutrition : 05/20/2020 Notes: Wound/Skin Impairment Nursing Diagnoses: Impaired tissue integrity Knowledge deficit related to ulceration/compromised skin integrity Goals: Patient/caregiver will verbalize understanding of skin care regimen Date Initiated: 05/20/2020 Target Resolution Date: 06/21/2020 Goal Status: Active Ulcer/skin breakdown will have a volume  reduction of 30% by week 4 Date Initiated: 05/20/2020 Target Resolution Date: 06/21/2020 Goal Status: Active Interventions: Assess patient/caregiver ability to obtain necessary supplies Assess patient/caregiver ability to perform ulcer/skin care regimen upon admission and as needed Assess ulceration(s) every visit Provide education on ulcer and skin care Notes: Electronic Signature(s) Signed: 05/27/2020 5:46:54 PM By: Levan Hurst RN, BSN Entered By: Levan Hurst on 05/27/2020 17:43:00 -------------------------------------------------------------------------------- Pain Assessment Details Patient Name: Date of Service: [name] Patee A. 05/27/2020 2:15 PM Medical  Record Number: [id number] Patient Account Number: [id number] Date of Birth/Sex: Treating RN: [birth year] (69 y.o. Elam Dutch Primary Care Asheley Hellberg: [name] Other Clinician: Referring Husayn Reim: Treating Ronnita Paz/Extender: Charlestine Massed in Treatment: 1 Active Problems Location of Pain Severity and Description of Pain Patient Has Paino No Site Locations Rate the pain. Current Pain Level: 0 Pain Management and Medication Current Pain Management: Electronic Signature(s) Signed: 05/27/2020 4:59:01 PM By: Baruch Gouty RN, BSN Entered By: Baruch Gouty on 05/27/2020 14:56:48 -------------------------------------------------------------------------------- Patient/Caregiver Education Details Patient Name: Date of Service: [name] 11/22/2021andnbsp2:15 PM Medical Record Number: [id number] Patient Account Number: [id number] Date of Birth/Gender: Treating RN: 08/18/1950 (69 y.o. [name] Primary Care Physician: [name] Other Clinician: Referring Physician: Treating Physician/Extender: Charlestine Massed in Treatment: 1 Education Assessment Education Provided To: Patient Education Topics  Provided Wound/Skin Impairment: Methods: Explain/Verbal Responses: State content correctly Electronic Signature(s) Signed: 05/27/2020 5:46:54 PM By: Levan Hurst RN, BSN Entered By: Levan Hurst on 05/27/2020 17:43:09 -------------------------------------------------------------------------------- Wound Assessment Details Patient Name: Date of Service: [name] Patee A. 05/27/2020 2:15 PM Medical Record Number: [id number] Patient Account Number: [id number] Date of Birth/Sex: Treating RN: [birth year] (69 y.o. Elam Dutch Primary Care Cheyane Ayon: [name] Other Clinician: Referring Deanna Wiater: Treating Myesha Stillion/Extender: Charlestine Massed in Treatment: 1 Wound Status Wound Number: 1 Primary Abscess Etiology: Wound Location: Pubis Wound Open Wounding Event: Gradually Appeared Status: Date Acquired: 02/04/2020 Comorbid Congestive Heart Failure, Coronary Artery Disease, Myocardial Weeks Of Treatment: 1 History: Infarction, Type II Diabetes Clustered Wound: No Wound Measurements Length: (cm) 0.2 Width: (cm) 0.2 Depth: (cm) 3.9 Area: (cm) 0.031 Volume: (cm) 0.123 % Reduction in Area: 0% % Reduction in Volume: -296.8% Epithelialization: None Tunneling: No Undermining: No Wound Description Classification: Full Thickness Without Exposed Support Structures Wound Margin: Flat and Intact Exudate Amount: Medium Exudate Type: Purulent Exudate Color: yellow, brown, green Foul Odor After Cleansing: No Slough/Fibrino Yes Wound Bed Granulation Amount: Large (67-100%) Exposed Structure Granulation Quality: Red Fascia Exposed: No Necrotic Amount: None Present (0%) Fat Layer (Subcutaneous Tissue) Exposed: Yes Tendon Exposed: No Muscle Exposed: No Joint Exposed: No Bone Exposed: No Electronic Signature(s) Signed: 05/27/2020 4:59:01 PM By: Baruch Gouty RN, BSN Entered By: Baruch Gouty on 05/27/2020  15:02:53 -------------------------------------------------------------------------------- Wound Assessment Details Patient Name: Date of Service: [name] Patee A. 05/27/2020 2:15 PM Medical Record Number: [id number] Patient Account Number: [id number] Date of Birth/Sex: Treating RN: [birth year] (69 y.o. [name] Primary Care Gessica Jawad: [name] Other Clinician: Referring Lovette Merta: Treating Erica Richwine/Extender: Charlestine Massed in Treatment: 1 Wound Status Wound Number: 2 Primary Abscess Etiology: Wound Location: Right Groin Wound Open Wounding Event: Gradually Appeared Status: Date Acquired: 02/04/2020 Comorbid Congestive Heart Failure, Coronary Artery Disease, Myocardial Weeks Of Treatment: 1 History: Infarction, Type II Diabetes Clustered Wound: No Wound Measurements Length: (cm) 0.1 Width: (cm) 0.1 Depth: (cm) 1 Area: (cm) 0.008 Volume: (cm) 0.008 % Reduction in Area: 96.6% % Reduction in Volume: 66.7% Epithelialization: Large (67-100%) Tunneling: No Undermining: No Wound Description Classification: Full Thickness Without Exposed Support Structures Wound Margin: Well defined, not attached Exudate Amount: None Present Foul Odor After Cleansing: No Slough/Fibrino No Wound Bed Granulation Amount: None Present (0%) Exposed Structure Necrotic Amount: None Present (0%) Fascia Exposed: No Fat Layer (Subcutaneous Tissue) Exposed: No Tendon Exposed: No Muscle Exposed: No Joint Exposed: No Bone Exposed: No Electronic Signature(s) Signed: 05/27/2020 5:46:54 PM By:  Levan Hurst RN, BSN Entered By: Levan Hurst on 05/27/2020 15:50:24 -------------------------------------------------------------------------------- Vitals Details Patient Name: Date of Service: [name], [name] 05/27/2020 2:15 PM Medical Record Number: [id number] Patient Account Number: [id number] Date of Birth/Sex: Treating RN: 1951-03-23 (69 y.o.  Elam Dutch Primary Care Janaiya Beauchesne: [name] Other Clinician: Referring Ramona Slinger: Treating Jeven Topper/Extender: Guinevere Scarlet, Vivi Ferns in Treatment: 1 Vital Signs Time Taken: 14:56 Temperature (F): 98.3 Height (in): 62 Pulse (bpm): 80 Source: Stated Respiratory Rate (breaths/min): 18 Weight (lbs): 172 Blood Pressure (mmHg): 135/77 Source: Stated Reference Range: 80 - 120 mg / dl Body Mass Index (BMI): 31.5 Electronic Signature(s) Signed: 05/27/2020 4:59:01 PM By: Baruch Gouty RN, BSN Entered By: Baruch Gouty on 05/27/2020 14:56:39

## 2020-05-29 DIAGNOSIS — D631 Anemia in chronic kidney disease: Secondary | ICD-10-CM | POA: Diagnosis not present

## 2020-05-29 DIAGNOSIS — K219 Gastro-esophageal reflux disease without esophagitis: Secondary | ICD-10-CM | POA: Diagnosis not present

## 2020-05-29 DIAGNOSIS — Z7982 Long term (current) use of aspirin: Secondary | ICD-10-CM | POA: Diagnosis not present

## 2020-05-29 DIAGNOSIS — I255 Ischemic cardiomyopathy: Secondary | ICD-10-CM | POA: Diagnosis not present

## 2020-05-29 DIAGNOSIS — D509 Iron deficiency anemia, unspecified: Secondary | ICD-10-CM | POA: Diagnosis not present

## 2020-05-29 DIAGNOSIS — I130 Hypertensive heart and chronic kidney disease with heart failure and stage 1 through stage 4 chronic kidney disease, or unspecified chronic kidney disease: Secondary | ICD-10-CM | POA: Diagnosis not present

## 2020-05-29 DIAGNOSIS — Z8673 Personal history of transient ischemic attack (TIA), and cerebral infarction without residual deficits: Secondary | ICD-10-CM | POA: Diagnosis not present

## 2020-05-29 DIAGNOSIS — N390 Urinary tract infection, site not specified: Secondary | ICD-10-CM | POA: Diagnosis not present

## 2020-05-29 DIAGNOSIS — N184 Chronic kidney disease, stage 4 (severe): Secondary | ICD-10-CM | POA: Diagnosis not present

## 2020-05-29 DIAGNOSIS — E1122 Type 2 diabetes mellitus with diabetic chronic kidney disease: Secondary | ICD-10-CM | POA: Diagnosis not present

## 2020-05-29 DIAGNOSIS — L02214 Cutaneous abscess of groin: Secondary | ICD-10-CM | POA: Diagnosis not present

## 2020-05-29 DIAGNOSIS — B952 Enterococcus as the cause of diseases classified elsewhere: Secondary | ICD-10-CM | POA: Diagnosis not present

## 2020-05-29 DIAGNOSIS — Z85038 Personal history of other malignant neoplasm of large intestine: Secondary | ICD-10-CM | POA: Diagnosis not present

## 2020-05-29 DIAGNOSIS — E785 Hyperlipidemia, unspecified: Secondary | ICD-10-CM | POA: Diagnosis not present

## 2020-05-29 DIAGNOSIS — I252 Old myocardial infarction: Secondary | ICD-10-CM | POA: Diagnosis not present

## 2020-05-29 DIAGNOSIS — I2510 Atherosclerotic heart disease of native coronary artery without angina pectoris: Secondary | ICD-10-CM | POA: Diagnosis not present

## 2020-05-29 DIAGNOSIS — L03314 Cellulitis of groin: Secondary | ICD-10-CM | POA: Diagnosis not present

## 2020-05-29 DIAGNOSIS — I5040 Unspecified combined systolic (congestive) and diastolic (congestive) heart failure: Secondary | ICD-10-CM | POA: Diagnosis not present

## 2020-05-29 DIAGNOSIS — J449 Chronic obstructive pulmonary disease, unspecified: Secondary | ICD-10-CM | POA: Diagnosis not present

## 2020-05-29 DIAGNOSIS — F17210 Nicotine dependence, cigarettes, uncomplicated: Secondary | ICD-10-CM | POA: Diagnosis not present

## 2020-05-31 DIAGNOSIS — D631 Anemia in chronic kidney disease: Secondary | ICD-10-CM | POA: Diagnosis not present

## 2020-05-31 DIAGNOSIS — Z7982 Long term (current) use of aspirin: Secondary | ICD-10-CM | POA: Diagnosis not present

## 2020-05-31 DIAGNOSIS — I5040 Unspecified combined systolic (congestive) and diastolic (congestive) heart failure: Secondary | ICD-10-CM | POA: Diagnosis not present

## 2020-05-31 DIAGNOSIS — I2510 Atherosclerotic heart disease of native coronary artery without angina pectoris: Secondary | ICD-10-CM | POA: Diagnosis not present

## 2020-05-31 DIAGNOSIS — Z8673 Personal history of transient ischemic attack (TIA), and cerebral infarction without residual deficits: Secondary | ICD-10-CM | POA: Diagnosis not present

## 2020-05-31 DIAGNOSIS — F17210 Nicotine dependence, cigarettes, uncomplicated: Secondary | ICD-10-CM | POA: Diagnosis not present

## 2020-05-31 DIAGNOSIS — L02214 Cutaneous abscess of groin: Secondary | ICD-10-CM | POA: Diagnosis not present

## 2020-05-31 DIAGNOSIS — E1122 Type 2 diabetes mellitus with diabetic chronic kidney disease: Secondary | ICD-10-CM | POA: Diagnosis not present

## 2020-05-31 DIAGNOSIS — Z85038 Personal history of other malignant neoplasm of large intestine: Secondary | ICD-10-CM | POA: Diagnosis not present

## 2020-05-31 DIAGNOSIS — N184 Chronic kidney disease, stage 4 (severe): Secondary | ICD-10-CM | POA: Diagnosis not present

## 2020-05-31 DIAGNOSIS — E785 Hyperlipidemia, unspecified: Secondary | ICD-10-CM | POA: Diagnosis not present

## 2020-05-31 DIAGNOSIS — L03314 Cellulitis of groin: Secondary | ICD-10-CM | POA: Diagnosis not present

## 2020-05-31 DIAGNOSIS — N390 Urinary tract infection, site not specified: Secondary | ICD-10-CM | POA: Diagnosis not present

## 2020-05-31 DIAGNOSIS — I255 Ischemic cardiomyopathy: Secondary | ICD-10-CM | POA: Diagnosis not present

## 2020-05-31 DIAGNOSIS — I252 Old myocardial infarction: Secondary | ICD-10-CM | POA: Diagnosis not present

## 2020-05-31 DIAGNOSIS — B952 Enterococcus as the cause of diseases classified elsewhere: Secondary | ICD-10-CM | POA: Diagnosis not present

## 2020-05-31 DIAGNOSIS — J449 Chronic obstructive pulmonary disease, unspecified: Secondary | ICD-10-CM | POA: Diagnosis not present

## 2020-05-31 DIAGNOSIS — I130 Hypertensive heart and chronic kidney disease with heart failure and stage 1 through stage 4 chronic kidney disease, or unspecified chronic kidney disease: Secondary | ICD-10-CM | POA: Diagnosis not present

## 2020-05-31 DIAGNOSIS — K219 Gastro-esophageal reflux disease without esophagitis: Secondary | ICD-10-CM | POA: Diagnosis not present

## 2020-05-31 DIAGNOSIS — D509 Iron deficiency anemia, unspecified: Secondary | ICD-10-CM | POA: Diagnosis not present

## 2020-06-03 ENCOUNTER — Ambulatory Visit (HOSPITAL_COMMUNITY)
Admission: RE | Admit: 2020-06-03 | Discharge: 2020-06-03 | Disposition: A | Discharge status: Home / Self Care | Payer: Medicare Other | Source: Ambulatory Visit | Attending: Internal Medicine | Admitting: Internal Medicine

## 2020-06-03 ENCOUNTER — Other Ambulatory Visit: Payer: Self-pay

## 2020-06-03 DIAGNOSIS — I700 Atherosclerosis of aorta: Secondary | ICD-10-CM | POA: Diagnosis not present

## 2020-06-03 DIAGNOSIS — K651 Peritoneal abscess: Secondary | ICD-10-CM | POA: Diagnosis not present

## 2020-06-03 DIAGNOSIS — L02211 Cutaneous abscess of abdominal wall: Secondary | ICD-10-CM | POA: Insufficient documentation

## 2020-06-03 DIAGNOSIS — K6389 Other specified diseases of intestine: Secondary | ICD-10-CM | POA: Diagnosis not present

## 2020-06-04 ENCOUNTER — Encounter (HOSPITAL_BASED_OUTPATIENT_CLINIC_OR_DEPARTMENT_OTHER): Payer: Medicare Other | Admitting: Internal Medicine

## 2020-06-04 ENCOUNTER — Telehealth: Payer: Self-pay | Admitting: *Deleted

## 2020-06-04 NOTE — Telephone Encounter (Signed)
Received from [facility] on (06/03/20) via of fax Physician Orders requesting to be signed and returned.    Orders signed and faxed back to [facility].  Confirmation received and copy scanned into the chart.//AB/CMA

## 2020-06-05 NOTE — Telephone Encounter (Signed)
    [place] Name: [facility] Agency Name: Annamarie Major Phone #: [phone] Service Requested: continue nursing, starting 11/30 Frequency of Visits: 2W8

## 2020-06-06 NOTE — Telephone Encounter (Signed)
OK. Thx

## 2020-06-06 NOTE — Telephone Encounter (Signed)
Called Cara there was no answer LMOM w/MD response.Marland KitchenJohny Chess

## 2020-06-07 DIAGNOSIS — I255 Ischemic cardiomyopathy: Secondary | ICD-10-CM | POA: Diagnosis not present

## 2020-06-07 DIAGNOSIS — E1122 Type 2 diabetes mellitus with diabetic chronic kidney disease: Secondary | ICD-10-CM | POA: Diagnosis not present

## 2020-06-07 DIAGNOSIS — E785 Hyperlipidemia, unspecified: Secondary | ICD-10-CM | POA: Diagnosis not present

## 2020-06-07 DIAGNOSIS — I2510 Atherosclerotic heart disease of native coronary artery without angina pectoris: Secondary | ICD-10-CM | POA: Diagnosis not present

## 2020-06-07 DIAGNOSIS — Z8673 Personal history of transient ischemic attack (TIA), and cerebral infarction without residual deficits: Secondary | ICD-10-CM | POA: Diagnosis not present

## 2020-06-07 DIAGNOSIS — L03314 Cellulitis of groin: Secondary | ICD-10-CM | POA: Diagnosis not present

## 2020-06-07 DIAGNOSIS — I5040 Unspecified combined systolic (congestive) and diastolic (congestive) heart failure: Secondary | ICD-10-CM | POA: Diagnosis not present

## 2020-06-07 DIAGNOSIS — Z7982 Long term (current) use of aspirin: Secondary | ICD-10-CM | POA: Diagnosis not present

## 2020-06-07 DIAGNOSIS — F17210 Nicotine dependence, cigarettes, uncomplicated: Secondary | ICD-10-CM | POA: Diagnosis not present

## 2020-06-07 DIAGNOSIS — N390 Urinary tract infection, site not specified: Secondary | ICD-10-CM | POA: Diagnosis not present

## 2020-06-07 DIAGNOSIS — D509 Iron deficiency anemia, unspecified: Secondary | ICD-10-CM | POA: Diagnosis not present

## 2020-06-07 DIAGNOSIS — K219 Gastro-esophageal reflux disease without esophagitis: Secondary | ICD-10-CM | POA: Diagnosis not present

## 2020-06-07 DIAGNOSIS — D631 Anemia in chronic kidney disease: Secondary | ICD-10-CM | POA: Diagnosis not present

## 2020-06-07 DIAGNOSIS — I252 Old myocardial infarction: Secondary | ICD-10-CM | POA: Diagnosis not present

## 2020-06-07 DIAGNOSIS — I130 Hypertensive heart and chronic kidney disease with heart failure and stage 1 through stage 4 chronic kidney disease, or unspecified chronic kidney disease: Secondary | ICD-10-CM | POA: Diagnosis not present

## 2020-06-07 DIAGNOSIS — Z85038 Personal history of other malignant neoplasm of large intestine: Secondary | ICD-10-CM | POA: Diagnosis not present

## 2020-06-07 DIAGNOSIS — N184 Chronic kidney disease, stage 4 (severe): Secondary | ICD-10-CM | POA: Diagnosis not present

## 2020-06-07 DIAGNOSIS — B952 Enterococcus as the cause of diseases classified elsewhere: Secondary | ICD-10-CM | POA: Diagnosis not present

## 2020-06-07 DIAGNOSIS — J449 Chronic obstructive pulmonary disease, unspecified: Secondary | ICD-10-CM | POA: Diagnosis not present

## 2020-06-07 DIAGNOSIS — L02214 Cutaneous abscess of groin: Secondary | ICD-10-CM | POA: Diagnosis not present

## 2020-06-10 ENCOUNTER — Other Ambulatory Visit: Payer: Self-pay

## 2020-06-10 ENCOUNTER — Other Ambulatory Visit: Payer: Self-pay | Admitting: Internal Medicine

## 2020-06-10 ENCOUNTER — Encounter (HOSPITAL_BASED_OUTPATIENT_CLINIC_OR_DEPARTMENT_OTHER): Payer: Medicare Other | Attending: Internal Medicine | Admitting: Internal Medicine

## 2020-06-10 ENCOUNTER — Other Ambulatory Visit (HOSPITAL_COMMUNITY): Payer: Self-pay | Admitting: Internal Medicine

## 2020-06-10 ENCOUNTER — Encounter (HOSPITAL_COMMUNITY): Payer: Self-pay

## 2020-06-10 DIAGNOSIS — E11628 Type 2 diabetes mellitus with other skin complications: Secondary | ICD-10-CM | POA: Diagnosis not present

## 2020-06-10 DIAGNOSIS — L02214 Cutaneous abscess of groin: Secondary | ICD-10-CM | POA: Diagnosis not present

## 2020-06-10 DIAGNOSIS — L02211 Cutaneous abscess of abdominal wall: Secondary | ICD-10-CM | POA: Diagnosis not present

## 2020-06-10 DIAGNOSIS — L98492 Non-pressure chronic ulcer of skin of other sites with fat layer exposed: Secondary | ICD-10-CM | POA: Diagnosis not present

## 2020-06-10 NOTE — Progress Notes (Signed)
CF 2  [name] Female, 69 y.o., 1950/08/28 MRN:  [id number] Phone:  [phone] (H) PCP:  [name], MD Primary Cvg:  [place] [facility] 06/24/2020 at 12:45 PM Message Received: 4 days ago Message Details  [name] Mckusick, DO  [place], Gennie [place], Carleene Cooper [place], Tiffany; [place], Junita Push, [place] Cc: Ricard Dillon, MD; P Ir Procedure Requests Good morning.   We will be getting CT guided drainage from Dr. Dellia Nims at the [facility].   Indication is abdominal wall abscess, for drain placement, outpatient procedure with subsequent follow up in clinic.    This is approved for scheduling.   Thank

## 2020-06-10 NOTE — Progress Notes (Signed)
[name], [name] ([id number]) Visit Report for 06/10/2020 HPI Details Patient Name: Date of Service: [name] 06/10/2020 11:00 A M Medical Record Number: [id number] Patient Account Number: [id number] Date of Birth/Sex: Treating RN: [birth year] (69 y.o. [name] Primary Care Provider: Cassandria [name] Other Clinician: Referring Provider: Treating Provider/Extender: Thayer Headings in Treatment: 3 History of Present Illness HPI Description: 04/19/2020 ADMISSION this is a patient who became acutely ill with sepsis in late March. She required admission to hospital from 03/05/2020 through 9//9/21 with abscesses in her groin. She was septic with Enterococcus faecalis she is treated initially with Vanco and Zosyn but discharged on amoxicillin. She required surgical debridement of 3 areas including 1 in the anterior thigh and 2 on the right buttock. She was discharged to [facility]. 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 She was seen by plastic surgery including Dr. Heber [place] and then by Phoebe Sharps one of their PAs twice. At that point they described the areas on the right inner groin and then 3 new abdominal wounds. The last culture I can see done by plastic surgery on 11/3 showed both Klebsiella and Enterobacter. She was on Bactrim for a. And more recently is now on Amoxil presumably for a Streptococcus species on 11/2. She is still taking Amoxil. She has not been feeling systemically unwell no fever no nausea she is back at home. She has home health putting calcium alginate on the wounds. I have included the original CT scan report before her surgery. I am not completely certain that the extent of this was appreciated. This may have accounted for why she developed recurrent areas after discharge from our hospital on her lower abdomen which is clearly  commented on in the CT scan report. I am not sure I could paraphrase this any better ADDENDUM REPORT 03/06/2020 21:05 : ADDENDUM: Critical Value/emergent results were called by telephone at the time of interpretation on 03/06/2020 at 9:05 pm to provider Sharlet Salina NP , who verbally acknowledged these results. Electronically Signed By: Lovena Le M.D. On: 03/06/2020 21:05 Signed by Briant Cedar, MD on 03/06/2020 9:08 PM Narrative and Impression CLINICAL DATA: Abdominal pain, abdominal and groin cellulitis EXAM: CT ABDOMEN AND PELVIS WITHOUT CONTRAST TECHNIQUE: Multidetector CT imaging of the abdomen and pelvis was performed following the standard protocol without IV contrast. COMPARISON: None. FINDINGS: Lower chest: Lung bases are clear. Cardiac size at the upper limits normal. Coronary artery calcifications are present. Suspect calcification of the mitral annulus as well. Hypoattenuation of the cardiac blood pool may reflect a mild anemia. Hepatobiliary: No visible concerning liver lesion within the limitations of this unenhanced of motion degraded CT Smooth liver . surface contour. Normal liver attenuation. Gallbladder is unremarkable. Question some biliary ductal dilatation difficult to fully discern given the lack of contrast media and extensive motion artifact. The extrahepatic common bile duct may measure up to 1.5 cm at the level of the pancreatic head. No visible intraductal gallstones. Pancreas: Poorly assessed given extensive motion artifact photon starvation. No pancreatic ductal dilatation or surrounding inflammatory changes. Spleen: Normal in size. No concerning splenic lesions. Splenic hilar vascular calcifications are noted. Adrenals/Urinary Tract: Adrenal glands are unremarkable. Kidneys are symmetric in size and normally located. Question some mild perinephric stranding versus volume averaging in the setting of motion artifact. No visible concerning renal lesion.  No urolithiasis or hydronephrosis. Urinary bladder  is unremarkable. Stomach/Bowel: Small sliding-type hiatal hernia. Distal stomach and duodenum are unremarkable. No small or large bowel thickening or dilatation. No evidence of bowel obstruction. Appendix is not visualized. No focal inflammation the vicinity of the cecum to suggest an occult appendicitis. Vascular/Lymphatic: Atherosclerotic calcifications within the abdominal aorta and branch vessels. No aneurysm or ectasia. No enlarged abdominopelvic lymph nodes. Likely reactive adenopathy seen in the inguinal regions. Reproductive: Anteverted uterus. Question endometrial thickening, greater than expected for patient age measuring up to 10 mm in struck thickness. No concerning adnexal lesions. Few parametrial calcifications are atypical senescent finding Other: There is extensive soft tissue stranding and thickening extending across the low anterior abdominal wall/panniculus. Large lobular phlegmon in the right groin/pudendal tissues superficial to the right adductor musculature and right inferior pubic ramus anteriorly with small amount of soft tissue gas. Is region is difficult to accurately measure given the lobular margins of med does extend up to 4.3 x 6.2 cm in maximal transaxial dimensions. Organized collection/abscess formation is difficult to ascertain in the absence of contrast media. Musculoskeletal: Soft tissue thickening and edematous changes of the adductor compartment of the right thigh could reflect some reactive or inflammatory myositis. No discernible intramuscular collection is seen. No acute or suspicious osseous lesions. Multilevel degenerative changes in the spine, hips and pelvis. Mild grade 1 anterolisthesis L4 on 5. No spondylolysis is evident. IMPRESSION: 1. Extensive soft tissue stranding and thickening extending across the low anterior abdominal wall/panniculus with a large, heterogeneous phlegmon within  the deep space of the right groin and pudendal soft tissues along the right adductor musculature and right inferior pubic ramus with small amount of soft tissue gas. Organized collection/abscess formation is difficult to ascertain in the absence of contrast media. Findings are concerning for severe cellulitis and aggressive soft tissue infection. Emergent surgical consultation is warranted. 2. Soft tissue thickening and edematous changes of the adductor compartment of the right thigh could reflect some reactive or inflammatory myositis. No discernible intramuscular collection is seen. 3. Question endometrial thickening, greater than expected for patient age measuring up to 10 mm in thickness. Recommend further evaluation with outpatient pelvic ultrasound when patient is able to better tolerate. 4. Aortic Atherosclerosis (ICD10-I70.0). Currently attempting to contact the ordering provider with a critical value result. Addendum will be submitted upon case discussion. Electronically Signed: By: Lovena Le M.D. On: 03/06/2020 21:02 05/27/2020 upon evaluation today patient appears to be doing better in my opinion with regard to some of the abscess areas currently. She has her CT scan coming up shortly over the next week. With that being said she has been on Augmentin 500 mg which has seemed to help her to be honest. We did have a culture as well that was obtained at the last visit and showed Streptococcus anginosus with that being said I do believe that it may be helpful for [place] to go ahead and extend her Augmentin which is on Thursday for another 2 weeks just to make sure that we keep things under control since she does seem to be doing better the patient is in agreement with that plan. 12/6; 2-week follow-up. The CT scan showed a sizable abscess in the anterior pelvic wall. I contacted interventional radiology Dr. Earleen Newport and we are arranging to have this drained. In the meantime her culture  showed strep anginosis and she is on Augmentin. Everything in the right medial thigh and groin closed over. The area on her right abdominal wall was closed over however the area on  the superior part of her left symphysis is still open but with less depth. There is no purulent drainage The patient is going to need the abscess drained although we still do not yet have a firm appointment from interventional radiology. We will check into this either today or tomorrow. I would like to make sure that we get a culture at the time they do this. She has enough antibiotics until Friday Electronic Signature(s) Signed: 06/10/2020 12:42:35 PM By: Linton Ham MD Entered By: Linton Ham on 06/10/2020 12:05:16 -------------------------------------------------------------------------------- Physical Exam Details Patient Name: Date of Service: [name] Patee A. 06/10/2020 11:00 A M Medical Record Number: [id number] Patient Account Number: [id number] Date of Birth/Sex: Treating RN: [birth year] (69 y.o. [name] Primary Care Provider: Cassandria [name] Other Clinician: Referring Provider: Treating Provider/Extender: Thayer Headings in Treatment: 3 Constitutional Sitting or standing Blood Pressure is within target range for patient.. Pulse regular and within target range for patient.[name] Respirations regular, non-labored and within target range.. Temperature is normal and within the target range for the patient.[name] Appears in no distress. Gastrointestinal (GI) Firm area in the right lower quadrant of her abdomen but this is closed over and nontender. No liver or spleen enlargement. Genitourinary (GU) Bladder is not distended. Notes Wound exam; the patient only has 1 small open area on the left side of the upper symphysis pubis. This has protruding subcutaneous tissue but no purulence about 2 cm of depth the area that was on her upper medial thigh and groin area has  closed over. She has a firm area in the right lower quadrant of her abdomen however this is remain closed over is Electronic Signature(s) Signed: 06/10/2020 12:42:35 PM By: Linton Ham MD Entered By: Linton Ham on 06/10/2020 12:06:53 -------------------------------------------------------------------------------- Physician Orders Details Patient Name: Date of Service: [name] Patee A. 06/10/2020 11:00 A M Medical Record Number: [id number] Patient Account Number: [id number] Date of Birth/Sex: Treating RN: 1950/11/13 (69 y.o. 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 Bathing/ Shower/ Hygiene May shower and wash wound with soap and water. - on days that dressing is changed Home Health No change in wound care orders this week; continue Home Health for wound care. May utilize formulary equivalent dressing for wound treatment orders unless otherwise specified. - [facility] Treatment Wound #1 - Pubis Cleanser: Wound Cleanser Every Other Day/15 Days Discharge Instructions: or normal saline Prim Dressing: KerraCel Ag Gelling Fiber Dressing, 2x2 in (silver alginate) (Home Health) Every Other Day/15 Days ary Discharge Instructions: Apply silver alginate to wound bed as instructed Secondary Dressing: Woven Gauze Sponges 2x2 in [facility]) Every Other Day/15 Days Discharge Instructions: Apply over primary dressing as directed. Secured With: Paper Tape, 2x10 (in/yd) (Home Health) Every Other Day/15 Days Discharge Instructions: Secure dressing with tape as  directed. Electronic Signature(s) Signed: 06/10/2020 12:42:35 PM By: Linton Ham MD Signed: 06/10/2020 5:36:08 PM By: Levan Hurst RN, BSN Entered By: Levan Hurst on 06/10/2020 11:23:43 -------------------------------------------------------------------------------- Problem List Details Patient Name: Date of Service: [name] Patee A. 06/10/2020 11:00 A M Medical Record Number: [id number] Patient Account Number: [id number] Date of Birth/Sex: Treating RN: Nov 23, 1950 (69 y.o. 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 06/10/2020 11:00 A M Medical Record Number: [id number] Patient Account Number: [id number] Date of Birth/Sex: Treating RN: 08-May-1951 (69 y.o. [name] Primary Care Provider: Cassandria [name] Other Clinician: Referring Provider: Treating Provider/Extender: Thayer Headings in Treatment: 3 Subjective History of Present Illness (HPI) 04/19/2020 ADMISSION this is a patient who became acutely ill with sepsis in late March. She required admission to hospital from 03/05/2020 through 9//9/21 with abscesses in her groin. She was septic with Enterococcus faecalis she is treated initially with Vanco and  Zosyn but discharged on amoxicillin. She required surgical debridement of 3 areas including 1 in the anterior thigh and 2 on the right buttock. She was discharged to [facility]. 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 She was seen by plastic surgery including Dr. Heber [place] and then by Phoebe Sharps one of their PAs twice. At that point they described the areas on the right inner groin and then 3 new abdominal wounds. The last culture I can see done by plastic surgery on 11/3 showed both Klebsiella and Enterobacter. She was on Bactrim for a. And more recently is now on Amoxil presumably for a Streptococcus species on 11/2. She is still taking Amoxil. She has not been feeling systemically unwell no fever no nausea she is back at home. She has home health putting calcium alginate on the wounds. I have included the original CT scan report before her surgery. I am not completely certain that the extent of this was appreciated. This may have accounted for why she developed recurrent areas after discharge from our hospital on her lower abdomen which is clearly commented on in the CT scan report. I am not sure I could paraphrase this any better ADDENDUM REPORT 03/06/2020 21:05 : ADDENDUM: Critical Value/emergent results were called by telephone at the time of interpretation on 03/06/2020 at 9:05 pm to provider Sharlet Salina NP , who verbally acknowledged these results. Electronically Signed By: Lovena Le M.D. On: 03/06/2020 21:05 Signed by Briant Cedar, MD on 03/06/2020 9:08 PM Narrative and Impression CLINICAL DATA: Abdominal pain, abdominal and groin cellulitis EXAM: CT ABDOMEN AND PELVIS WITHOUT CONTRAST TECHNIQUE: Multidetector CT imaging of the abdomen and pelvis was performed following the standard protocol without IV contrast. COMPARISON: None. FINDINGS: Lower chest: Lung bases are clear. Cardiac size at the  upper limits normal. Coronary artery calcifications are present. Suspect calcification of the mitral annulus as well. Hypoattenuation of the cardiac blood pool may reflect a mild anemia. Hepatobiliary: No visible concerning liver lesion within the limitations of this unenhanced of motion degraded CT Smooth liver . surface contour. Normal liver attenuation. Gallbladder is unremarkable. Question some biliary ductal dilatation difficult to fully discern given the lack of contrast media and extensive motion artifact. The extrahepatic common bile duct may measure up to 1.5 cm at the level of the pancreatic head.  No visible intraductal gallstones. Pancreas: Poorly assessed given extensive motion artifact photon starvation. No pancreatic ductal dilatation or surrounding inflammatory changes. Spleen: Normal in size. No concerning splenic lesions. Splenic hilar vascular calcifications are noted. Adrenals/Urinary Tract: Adrenal glands are unremarkable. Kidneys are symmetric in size and normally located. Question some mild perinephric stranding versus volume averaging in the setting of motion artifact. No visible concerning renal lesion. No urolithiasis or hydronephrosis. Urinary bladder is unremarkable. Stomach/Bowel: Small sliding-type hiatal hernia. Distal stomach and duodenum are unremarkable. No small or large bowel thickening or dilatation. No evidence of bowel obstruction. Appendix is not visualized. No focal inflammation the vicinity of the cecum to suggest an occult appendicitis. Vascular/Lymphatic: Atherosclerotic calcifications within the abdominal aorta and branch vessels. No aneurysm or ectasia. No enlarged abdominopelvic lymph nodes. Likely reactive adenopathy seen in the inguinal regions. Reproductive: Anteverted uterus. Question endometrial thickening, greater than expected for patient age measuring up to 10 mm in struck thickness. No concerning adnexal lesions. Few  parametrial calcifications are atypical senescent finding Other: There is extensive soft tissue stranding and thickening extending across the low anterior abdominal wall/panniculus. Large lobular phlegmon in the right groin/pudendal tissues superficial to the right adductor musculature and right inferior pubic ramus anteriorly with small amount of soft tissue gas. Is region is difficult to accurately measure given the lobular margins of med does extend up to 4.3 x 6.2 cm in maximal transaxial dimensions. Organized collection/abscess formation is difficult to ascertain in the absence of contrast media. Musculoskeletal: Soft tissue thickening and edematous changes of the adductor compartment of the right thigh could reflect some reactive or inflammatory myositis. No discernible intramuscular collection is seen. No acute or suspicious osseous lesions. Multilevel degenerative changes in the spine, hips and pelvis. Mild grade 1 anterolisthesis L4 on 5. No spondylolysis is evident. IMPRESSION: 1. Extensive soft tissue stranding and thickening extending across the low anterior abdominal wall/panniculus with a large, heterogeneous phlegmon within the deep space of the right groin and pudendal soft tissues along the right adductor musculature and right inferior pubic ramus with small amount of soft tissue gas. Organized collection/abscess formation is difficult to ascertain in the absence of contrast media. Findings are concerning for severe cellulitis and aggressive soft tissue infection. Emergent surgical consultation is warranted. 2. Soft tissue thickening and edematous changes of the adductor compartment of the right thigh could reflect some reactive or inflammatory myositis. No discernible intramuscular collection is seen. 3. Question endometrial thickening, greater than expected for patient age measuring up to 10 mm in thickness. Recommend further evaluation with outpatient pelvic  ultrasound when patient is able to better tolerate. 4. Aortic Atherosclerosis (ICD10-I70.0). Currently attempting to contact the ordering provider with a critical value result. Addendum will be submitted upon case discussion. Electronically Signed: By: Lovena Le M.D. On: 03/06/2020 21:02 05/27/2020 upon evaluation today patient appears to be doing better in my opinion with regard to some of the abscess areas currently. She has her CT scan coming up shortly over the next week. With that being said she has been on Augmentin 500 mg which has seemed to help her to be honest. We did have a culture as well that was obtained at the last visit and showed Streptococcus anginosus with that being said I do believe that it may be helpful for [place] to go ahead and extend her Augmentin which is on Thursday for another 2 weeks just to make sure that we keep things under control since she does seem to be doing better  the patient is in agreement with that plan. 12/6; 2-week follow-up. The CT scan showed a sizable abscess in the anterior pelvic wall. I contacted interventional radiology Dr. Earleen Newport and we are arranging to have this drained. In the meantime her culture showed strep anginosis and she is on Augmentin. Everything in the right medial thigh and groin closed over. The area on her right abdominal wall was closed over however the area on the superior part of her left symphysis is still open but with less depth. There is no purulent drainage The patient is going to need the abscess drained although we still do not yet have a firm appointment from interventional radiology. We will check into this either today or tomorrow. I would like to make sure that we get a culture at the time they do this. She has enough antibiotics until Friday Objective Constitutional Sitting or standing Blood Pressure is within target range for patient.. Pulse regular and within target range for patient.[name] Respirations regular,  non-labored and within target range.. Temperature is normal and within the target range for the patient.[name] Appears in no distress. Vitals Time Taken: 11:05 AM, Height: 62 in, Weight: 172 lbs, BMI: 31.5, Temperature: 98.3 F, Pulse: 101 bpm, Respiratory Rate: 18 breaths/min, Blood Pressure: 125/69 mmHg. Gastrointestinal (GI) Firm area in the right lower quadrant of her abdomen but this is closed over and nontender. No liver or spleen enlargement. Genitourinary (GU) Bladder is not distended. General Notes: Wound exam; the patient only has 1 small open area on the left side of the upper symphysis pubis. This has protruding subcutaneous tissue but no purulence about 2 cm of depth the area that was on her upper medial thigh and groin area has closed over. She has a firm area in the right lower quadrant of her abdomen however this is remain closed over is Integumentary (Hair, Skin) Wound #1 status is Open. Original cause of wound was Gradually Appeared. The wound is located on the Pubis. The wound measures 0.5cm length x 0.5cm width x 0.1cm depth; 0.196cm^2 area and 0.02cm^3 volume. There is Fat Layer (Subcutaneous Tissue) exposed. There is no tunneling or undermining noted. There is a medium amount of purulent drainage noted. The wound margin is flat and intact. There is large (67-100%) red granulation within the wound bed. There is no necrotic tissue within the wound bed. Wound #2 status is Healed - Epithelialized. Original cause of wound was Gradually Appeared. The wound is located on the Right Groin. The wound measures 0cm length x 0cm width x 0cm depth; 0cm^2 area and 0cm^3 volume. There is no tunneling or undermining noted. There is a none present amount of drainage noted. The wound margin is well defined and not attached to the wound base. There is no granulation within the wound bed. There is no necrotic tissue within the wound bed. 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 Bathing/ Shower/ Hygiene: May shower and wash wound with soap and water. - on days that dressing is changed Home Health: No change in wound care orders this week; continue Home Health for wound care. May utilize formulary equivalent dressing for wound treatment orders unless otherwise specified. - [facility] WOUND #1: - Pubis Wound Laterality: Cleanser: Wound Cleanser Every Other Day/15 Days Discharge Instructions: or normal saline Prim Dressing: KerraCel Ag Gelling Fiber Dressing, 2x2 in (silver alginate) (Home Health) Every Other  Day/15 Days ary Discharge Instructions: Apply silver alginate to wound bed as instructed Secondary Dressing: Woven Gauze Sponges 2x2 in [facility]) Every Other Day/15 Days Discharge Instructions: Apply over primary dressing as directed. Secured With: Paper T ape, 2x10 (in/yd) (Home Health) Every Other Day/15 Days Discharge Instructions: Secure dressing with tape as directed. #1 the patient is improved probably because of her antibiotics although I do not think antibiotics are alone are going to fix this collection she has 2. I have contacted interventional radiology but the patient still does not have an appointment. I have asked her to call us tomorrow if she has not heard anything 3. My understanding is that she will have a CT scan guided drainage of this area 4. Her antibiotics are good to the end of the week I am not planning to extend him beyond that. 5. She remains systemically well Electronic Signature(s) Signed: 06/10/2020 12:42:35 PM By: Linton Ham MD Entered By: Linton Ham on 06/10/2020 12:08:29 -------------------------------------------------------------------------------- SuperBill Details Patient Name: Date of Service: [name] Patee A. 06/10/2020 Medical Record Number:  [id number] Patient Account Number: [id number] Date of Birth/Sex: Treating RN: 1951/03/30 (69 y.o. 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

## 2020-06-11 ENCOUNTER — Telehealth: Payer: Self-pay | Admitting: *Deleted

## 2020-06-11 NOTE — Telephone Encounter (Signed)
Received Physician Order on (06/07/20) via of fax from [facility] requesting signature,date and return.  Given to the provider to sign.  Orders signed and faxed back to [facility].  Confirmation received and copy scanned into the chart.//AB/CMA

## 2020-06-11 NOTE — Progress Notes (Signed)
RAVLEEN, RIES ([id number]) Visit Report for 06/10/2020 Arrival Information Details Patient Name: Date of Service: CARINNE, BRANDENBURGER 06/10/2020 11:00 A M Medical Record Number: [id number] Patient Account Number: [id number] Date of Birth/Sex: Treating RN: [birth year] (69 y.o. Orvan Falconer Primary Care Toriana Sponsel: [name] Other Clinician: Referring Twanisha Foulk: Treating Jovany Disano/Extender: Thayer Headings in Treatment: 3 Visit Information History Since Last Visit All ordered tests and consults were completed: No Patient Arrived: Ambulatory Added or deleted any medications: No Arrival Time: 10:59 Any new allergies or adverse reactions: No Accompanied By: self Had a fall or experienced change in No Transfer Assistance: None activities of daily living that may affect Patient Identification Verified: Yes risk of falls: Secondary Verification Process Completed: Yes Signs or symptoms of abuse/neglect since last visito No Patient Requires Transmission-Based Precautions: No Hospitalized since last visit: No Patient Has Alerts: No Implantable device outside of the clinic excluding No cellular tissue based products placed in the center since last visit: Has Dressing in Place as Prescribed: Yes Pain Present Now: No Electronic Signature(s) Signed: 06/11/2020 5:28:36 PM By: Carlene Coria RN Entered By: Carlene Coria on 06/10/2020 11:05:00 -------------------------------------------------------------------------------- Clinic Level of Care Assessment Details Patient Name: Date of Service: NANCEY, KREITZ 06/10/2020 11:00 A M Medical Record Number: [id number] Patient Account Number: [id number] Date of Birth/Sex: Treating RN: October 30, 1950 (69 y.o. [name] Primary Care Caron Ode: [name] Other Clinician: Referring Steele Ledonne: Treating Vernis Eid/Extender: Thayer Headings in Treatment: 3 Clinic Level of  Care Assessment Items TOOL 4 Quantity Score X- 1 0 Use when only an EandM is performed on FOLLOW-UP visit ASSESSMENTS - Nursing Assessment / Reassessment X- 1 10 Reassessment of Co-morbidities (includes updates in patient status) X- 1 5 Reassessment of Adherence to Treatment Plan ASSESSMENTS - Wound and Skin A ssessment / Reassessment []  - 0 Simple Wound Assessment / Reassessment - one wound X- 2 5 Complex Wound Assessment / Reassessment - multiple wounds []  - 0 Dermatologic / Skin Assessment (not related to wound area) ASSESSMENTS - Focused Assessment []  - 0 Circumferential Edema Measurements - multi extremities []  - 0 Nutritional Assessment / Counseling / Intervention []  - 0 Lower Extremity Assessment (monofilament, tuning fork, pulses) []  - 0 Peripheral Arterial Disease Assessment (using hand held doppler) ASSESSMENTS - Ostomy and/or Continence Assessment and Care []  - 0 Incontinence Assessment and Management []  - 0 Ostomy Care Assessment and Management (repouching, etc.) PROCESS - Coordination of Care X - Simple Patient / Family Education for ongoing care 1 15 []  - 0 Complex (extensive) Patient / Family Education for ongoing care X- 1 10 Staff obtains Consents, Records, T Results / Process Orders est X- 1 10 Staff telephones HHA, Nursing Homes / Clarify orders / etc []  - 0 Routine Transfer to another Facility (non-emergent condition) []  - 0 Routine Hospital Admission (non-emergent condition) []  - 0 New Admissions / Biomedical engineer / Ordering NPWT Apligraf, etc. , []  - 0 Emergency Hospital Admission (emergent condition) X- 1 10 Simple Discharge Coordination []  - 0 Complex (extensive) Discharge Coordination PROCESS - Special Needs []  - 0 Pediatric / Minor Patient Management []  - 0 Isolation Patient Management []  - 0 Hearing / Language / Visual special needs []  - 0 Assessment of Community assistance (transportation, D/C planning, etc.) []  -  0 Additional assistance / Altered mentation []  - 0 Support Surface(s) Assessment (bed, cushion, seat, etc.) INTERVENTIONS - Wound Cleansing / Measurement []  - 0 Simple Wound Cleansing -  one wound X- 2 5 Complex Wound Cleansing - multiple wounds X- 1 5 Wound Imaging (photographs - any number of wounds) []  - 0 Wound Tracing (instead of photographs) []  - 0 Simple Wound Measurement - one wound X- 2 5 Complex Wound Measurement - multiple wounds INTERVENTIONS - Wound Dressings X - Small Wound Dressing one or multiple wounds 1 10 []  - 0 Medium Wound Dressing one or multiple wounds []  - 0 Large Wound Dressing one or multiple wounds X- 1 5 Application of Medications - topical []  - 0 Application of Medications - injection INTERVENTIONS - Miscellaneous []  - 0 External ear exam []  - 0 Specimen Collection (cultures, biopsies, blood, body fluids, etc.) []  - 0 Specimen(s) / Culture(s) sent or taken to Lab for analysis []  - 0 Patient Transfer (multiple staff / Civil Service fast streamer / Similar devices) []  - 0 Simple Staple / Suture removal (25 or less) []  - 0 Complex Staple / Suture removal (26 or more) []  - 0 Hypo / Hyperglycemic Management (close monitor of Blood Glucose) []  - 0 Ankle / Brachial Index (ABI) - do not check if billed separately X- 1 5 Vital Signs Has the patient been seen at the hospital within the last three years: Yes Total Score: 115 Level Of Care: New/Established - Level 3 Electronic Signature(s) Signed: 06/10/2020 5:36:08 PM By: Levan Hurst RN, BSN Entered By: Levan Hurst on 06/10/2020 11:57:51 -------------------------------------------------------------------------------- Lower Extremity Assessment Details Patient Name: Date of Service: Alferd Patee A. 06/10/2020 11:00 A M Medical Record Number: [id number] Patient Account Number: [id number] Date of Birth/Sex: Treating RN: 1950/09/04 (69 y.o. Orvan Falconer Primary Care Tyjay Galindo: [name] Other  Clinician: Referring Adreyan Carbajal: Treating Kashon Kraynak/Extender: Thayer Headings in Treatment: 3 Electronic Signature(s) Signed: 06/11/2020 5:28:36 PM By: Carlene Coria RN Entered By: Carlene Coria on 06/10/2020 11:05:46 -------------------------------------------------------------------------------- Multi Wound Chart Details Patient Name: Date of Service: Alferd Patee A. 06/10/2020 11:00 A M Medical Record Number: [id number] Patient Account Number: [id number] Date of Birth/Sex: Treating RN: December 24, 1950 (69 y.o. [name] Primary Care Hajime Asfaw: [name] Other Clinician: Referring Celia Gibbons: Treating Laryn Venning/Extender: Thayer Headings in Treatment: 3 Vital Signs Height(in): 62 Pulse(bpm): 101 Weight(lbs): 172 Blood Pressure(mmHg): 125/69 Body Mass Index(BMI): 31 Temperature(F): 98.3 Respiratory Rate(breaths/min): 18 Photos: [1:No Photos Pubis] [2:No Photos Right Groin] [N/A:N/A N/A] Wound Location: [1:Gradually Appeared] [2:Gradually Appeared] [N/A:N/A] Wounding Event: [1:Abscess] [2:Abscess] [N/A:N/A] Primary Etiology: [1:Congestive Heart Failure, Coronary] [2:Congestive Heart Failure, Coronary] [N/A:N/A] Comorbid History: [1:Artery Disease, Myocardial Infarction, Type II Diabetes 02/04/2020] [2:Artery Disease, Myocardial Infarction, Type II Diabetes 02/04/2020] [N/A:N/A] Date Acquired: [1:3] [2:3] [N/A:N/A] Weeks of Treatment: [1:Open] [2:Healed - Epithelialized] [N/A:N/A] Wound Status: [1:0.5x0.5x0.1] [2:0x0x0] [N/A:N/A] Measurements L x W x D (cm) [1:0.196] [2:0] [N/A:N/A] A (cm) : rea [1:0.02] [2:0] [N/A:N/A] Volume (cm) : [1:-532.30%] [2:100.00%] [N/A:N/A] % Reduction in Area: [1:35.50%] [2:100.00%] [N/A:N/A] % Reduction in Volume: [1:Full Thickness Without Exposed] [2:Full Thickness Without Exposed] [N/A:N/A] Classification: [1:Support Structures Medium] [2:Support Structures None Present]  [N/A:N/A] Exudate Amount: [1:Purulent] [2:N/A] [N/A:N/A] Exudate Type: [1:yellow, brown, green] [2:N/A] [N/A:N/A] Exudate Color: [1:Flat and Intact] [2:Well defined, not attached] [N/A:N/A] Wound Margin: [1:Large (67-100%)] [2:None Present (0%)] [N/A:N/A] Granulation Amount: [1:Red] [2:N/A] [N/A:N/A] Granulation Quality: [1:None Present (0%)] [2:None Present (0%)] [N/A:N/A] Necrotic Amount: [1:Fat Layer (Subcutaneous Tissue): Yes Fascia: No] [N/A:N/A] Exposed Structures: [1:Fascia: No Tendon: No Muscle: No Joint: No Bone: No None] [2:Fat Layer (Subcutaneous Tissue): No Tendon: No Muscle: No Joint: No Bone: No Large (67-100%)] [  N/A:N/A] Treatment Notes Electronic Signature(s) Signed: 06/10/2020 12:42:35 PM By: Linton Ham MD Signed: 06/10/2020 5:36:08 PM By: Levan Hurst RN, BSN Entered By: Linton Ham on 06/10/2020 12:03:20 -------------------------------------------------------------------------------- Multi-Disciplinary Care Plan Details Patient Name: Date of Service: Alferd Patee A. 06/10/2020 11:00 A M Medical Record Number: [id number] Patient Account Number: [id number] Date of Birth/Sex: Treating RN: May 16, 1951 (69 y.o. [name] Primary Care Kailon Treese: [name] Other Clinician: Referring Emalia Witkop: Treating Martha Ellerby/Extender: Thayer Headings in Treatment: 3 Active Inactive Nutrition Nursing Diagnoses: Impaired glucose control: actual or potential Potential for alteratiion in Nutrition/Potential for imbalanced nutrition Goals: Patient/caregiver agrees to and verbalizes understanding of need to use nutritional supplements and/or vitamins as prescribed Date Initiated: 05/20/2020 Target Resolution Date: 06/21/2020 Goal Status: Active Patient/caregiver will maintain therapeutic glucose control Date Initiated: 05/20/2020 Target Resolution Date: 06/21/2020 Goal Status: Active Interventions: Assess HgA1c results as  ordered upon admission and as needed Assess patient nutrition upon admission and as needed per policy Provide education on elevated blood sugars and impact on wound healing Provide education on nutrition Treatment Activities: Education provided on Nutrition : 05/20/2020 Notes: Wound/Skin Impairment Nursing Diagnoses: Impaired tissue integrity Knowledge deficit related to ulceration/compromised skin integrity Goals: Patient/caregiver will verbalize understanding of skin care regimen Date Initiated: 05/20/2020 Target Resolution Date: 06/21/2020 Goal Status: Active Ulcer/skin breakdown will have a volume reduction of 30% by week 4 Date Initiated: 05/20/2020 Target Resolution Date: 06/21/2020 Goal Status: Active Interventions: Assess patient/caregiver ability to obtain necessary supplies Assess patient/caregiver ability to perform ulcer/skin care regimen upon admission and as needed Assess ulceration(s) every visit Provide education on ulcer and skin care Notes: Electronic Signature(s) Signed: 06/10/2020 5:36:08 PM By: Levan Hurst RN, BSN Entered By: Levan Hurst on 06/10/2020 11:57:02 -------------------------------------------------------------------------------- Pain Assessment Details Patient Name: Date of Service: Alferd Patee A. 06/10/2020 11:00 A M Medical Record Number: [id number] Patient Account Number: [id number] Date of Birth/Sex: Treating RN: 19-Apr-1951 (69 y.o. Orvan Falconer Primary Care Tyishia Aune: [name] Other Clinician: Referring Elton Heid: Treating Aryaa Bunting/Extender: Thayer Headings in Treatment: 3 Active Problems Location of Pain Severity and Description of Pain Patient Has Paino No Site Locations Pain Management and Medication Current Pain Management: Electronic Signature(s) Signed: 06/11/2020 5:28:36 PM By: Carlene Coria RN Entered By: Carlene Coria on 06/10/2020  11:05:40 -------------------------------------------------------------------------------- Patient/Caregiver Education Details Patient Name: Date of Service: Saverio Danker 12/6/2021andnbsp11:00 A M Medical Record Number: [id number] Patient Account Number: [id number] Date of Birth/Gender: Treating RN: May 16, 1951 (69 y.o. [name] Primary Care Physician: [name] Other Clinician: Referring Physician: Treating Physician/Extender: Thayer Headings in Treatment: 3 Education Assessment Education Provided To: Patient Education Topics Provided Wound/Skin Impairment: Methods: Explain/Verbal Responses: State content correctly Electronic Signature(s) Signed: 06/10/2020 5:36:08 PM By: Levan Hurst RN, BSN Entered By: Levan Hurst on 06/10/2020 11:57:16 -------------------------------------------------------------------------------- Wound Assessment Details Patient Name: Date of Service: Alferd Patee A. 06/10/2020 11:00 A M Medical Record Number: [id number] Patient Account Number: [id number] Date of Birth/Sex: Treating RN: 1950/10/15 (69 y.o. Orvan Falconer Primary Care Jakyah Bradby: [name] Other Clinician: Referring Sande Pickert: Treating Craigory Toste/Extender: Thayer Headings in Treatment: 3 Wound Status Wound Number: 1 Primary Abscess Etiology: Wound Location: Pubis Wound Open Wounding Event: Gradually Appeared Status: Date Acquired: 02/04/2020 Comorbid Congestive Heart Failure, Coronary Artery Disease, Myocardial Weeks Of Treatment: 3 History: Infarction, Type II Diabetes Clustered Wound: No Wound Measurements Length: (cm) 0.5 Width: (cm) 0.5 Depth: (cm) 0.1 Area: (  cm) 0.196 Volume: (cm) 0.02 % Reduction in Area: -532.3% % Reduction in Volume: 35.5% Epithelialization: None Tunneling: No Undermining: No Wound Description Classification: Full Thickness Without Exposed Support  Structures Wound Margin: Flat and Intact Exudate Amount: Medium Exudate Type: Purulent Exudate Color: yellow, brown, green Foul Odor After Cleansing: No Slough/Fibrino Yes Wound Bed Granulation Amount: Large (67-100%) Exposed Structure Granulation Quality: Red Fascia Exposed: No Necrotic Amount: None Present (0%) Fat Layer (Subcutaneous Tissue) Exposed: Yes Tendon Exposed: No Muscle Exposed: No Joint Exposed: No Bone Exposed: No Electronic Signature(s) Signed: 06/11/2020 5:28:36 PM By: Carlene Coria RN Entered By: Carlene Coria on 06/10/2020 11:11:41 -------------------------------------------------------------------------------- Wound Assessment Details Patient Name: Date of Service: ANNI, HOCEVAR A. 06/10/2020 11:00 A M Medical Record Number: [id number] Patient Account Number: [id number] Date of Birth/Sex: Treating RN: December 30, 1950 (69 y.o. Orvan Falconer Primary Care Crystalann Korf: [name] Other Clinician: Referring Lorenza Shakir: Treating Blakely Maranan/Extender: Thayer Headings in Treatment: 3 Wound Status Wound Number: 2 Primary Abscess Etiology: Wound Location: Right Groin Wound Healed - Epithelialized Wounding Event: Gradually Appeared Status: Date Acquired: 02/04/2020 Comorbid Congestive Heart Failure, Coronary Artery Disease, Myocardial Weeks Of Treatment: 3 History: Infarction, Type II Diabetes Clustered Wound: No Wound Measurements Length: (cm) Width: (cm) Depth: (cm) Area: (cm) Volume: (cm) 0 % Reduction in Area: 100% 0 % Reduction in Volume: 100% 0 Epithelialization: Large (67-100%) 0 Tunneling: No 0 Undermining: No Wound Description Classification: Full Thickness Without Exposed Support Structures Wound Margin: Well defined, not attached Exudate Amount: None Present Foul Odor After Cleansing: No Slough/Fibrino No Wound Bed Granulation Amount: None Present (0%) Exposed Structure Necrotic Amount: None Present  (0%) Fascia Exposed: No Fat Layer (Subcutaneous Tissue) Exposed: No Tendon Exposed: No Muscle Exposed: No Joint Exposed: No Bone Exposed: No Electronic Signature(s) Signed: 06/10/2020 5:36:08 PM By: Levan Hurst RN, BSN Signed: 06/11/2020 5:28:36 PM By: Carlene Coria RN Entered By: Levan Hurst on 06/10/2020 11:21:40 -------------------------------------------------------------------------------- [facility] Details Patient Name: Date of Service: Alferd Patee A. 06/10/2020 11:00 A M Medical Record Number: [id number] Patient Account Number: [id number] Date of Birth/Sex: Treating RN: February 25, 1951 (69 y.o. Orvan Falconer Primary Care Jasamine Pottinger: [name] Other Clinician: Referring Isabellamarie Randa: Treating Janus Vlcek/Extender: Thayer Headings in Treatment: 3 Vital Signs Time Taken: 11:05 Temperature (F): 98.3 Height (in): 62 Pulse (bpm): 101 Weight (lbs): 172 Respiratory Rate (breaths/min): 18 Body Mass Index (BMI): 31.5 Blood Pressure (mmHg): 125/69 Reference Range: 80 - 120 mg / dl Electronic Signature(s) Signed: 06/11/2020 5:28:36 PM By: Carlene Coria RN Entered By: Carlene Coria on 06/10/2020 11:05:34

## 2020-06-12 ENCOUNTER — Ambulatory Visit: Payer: Medicare Other | Admitting: Obstetrics and Gynecology

## 2020-06-12 DIAGNOSIS — J449 Chronic obstructive pulmonary disease, unspecified: Secondary | ICD-10-CM | POA: Diagnosis not present

## 2020-06-12 DIAGNOSIS — I5040 Unspecified combined systolic (congestive) and diastolic (congestive) heart failure: Secondary | ICD-10-CM | POA: Diagnosis not present

## 2020-06-12 DIAGNOSIS — I252 Old myocardial infarction: Secondary | ICD-10-CM | POA: Diagnosis not present

## 2020-06-12 DIAGNOSIS — I255 Ischemic cardiomyopathy: Secondary | ICD-10-CM | POA: Diagnosis not present

## 2020-06-12 DIAGNOSIS — N184 Chronic kidney disease, stage 4 (severe): Secondary | ICD-10-CM | POA: Diagnosis not present

## 2020-06-12 DIAGNOSIS — D631 Anemia in chronic kidney disease: Secondary | ICD-10-CM | POA: Diagnosis not present

## 2020-06-12 DIAGNOSIS — K219 Gastro-esophageal reflux disease without esophagitis: Secondary | ICD-10-CM | POA: Diagnosis not present

## 2020-06-12 DIAGNOSIS — I2510 Atherosclerotic heart disease of native coronary artery without angina pectoris: Secondary | ICD-10-CM | POA: Diagnosis not present

## 2020-06-12 DIAGNOSIS — Z7982 Long term (current) use of aspirin: Secondary | ICD-10-CM | POA: Diagnosis not present

## 2020-06-12 DIAGNOSIS — E1122 Type 2 diabetes mellitus with diabetic chronic kidney disease: Secondary | ICD-10-CM | POA: Diagnosis not present

## 2020-06-12 DIAGNOSIS — L03314 Cellulitis of groin: Secondary | ICD-10-CM | POA: Diagnosis not present

## 2020-06-12 DIAGNOSIS — D509 Iron deficiency anemia, unspecified: Secondary | ICD-10-CM | POA: Diagnosis not present

## 2020-06-12 DIAGNOSIS — F17210 Nicotine dependence, cigarettes, uncomplicated: Secondary | ICD-10-CM | POA: Diagnosis not present

## 2020-06-12 DIAGNOSIS — I130 Hypertensive heart and chronic kidney disease with heart failure and stage 1 through stage 4 chronic kidney disease, or unspecified chronic kidney disease: Secondary | ICD-10-CM | POA: Diagnosis not present

## 2020-06-12 DIAGNOSIS — Z8673 Personal history of transient ischemic attack (TIA), and cerebral infarction without residual deficits: Secondary | ICD-10-CM | POA: Diagnosis not present

## 2020-06-12 DIAGNOSIS — L02214 Cutaneous abscess of groin: Secondary | ICD-10-CM | POA: Diagnosis not present

## 2020-06-12 DIAGNOSIS — N390 Urinary tract infection, site not specified: Secondary | ICD-10-CM | POA: Diagnosis not present

## 2020-06-12 DIAGNOSIS — Z85038 Personal history of other malignant neoplasm of large intestine: Secondary | ICD-10-CM | POA: Diagnosis not present

## 2020-06-12 DIAGNOSIS — B952 Enterococcus as the cause of diseases classified elsewhere: Secondary | ICD-10-CM | POA: Diagnosis not present

## 2020-06-12 DIAGNOSIS — E785 Hyperlipidemia, unspecified: Secondary | ICD-10-CM | POA: Diagnosis not present

## 2020-06-14 ENCOUNTER — Other Ambulatory Visit: Payer: Self-pay | Admitting: Student

## 2020-06-14 DIAGNOSIS — Z85038 Personal history of other malignant neoplasm of large intestine: Secondary | ICD-10-CM | POA: Diagnosis not present

## 2020-06-14 DIAGNOSIS — I2510 Atherosclerotic heart disease of native coronary artery without angina pectoris: Secondary | ICD-10-CM | POA: Diagnosis not present

## 2020-06-14 DIAGNOSIS — E785 Hyperlipidemia, unspecified: Secondary | ICD-10-CM | POA: Diagnosis not present

## 2020-06-14 DIAGNOSIS — L02214 Cutaneous abscess of groin: Secondary | ICD-10-CM | POA: Diagnosis not present

## 2020-06-14 DIAGNOSIS — I255 Ischemic cardiomyopathy: Secondary | ICD-10-CM | POA: Diagnosis not present

## 2020-06-14 DIAGNOSIS — K219 Gastro-esophageal reflux disease without esophagitis: Secondary | ICD-10-CM | POA: Diagnosis not present

## 2020-06-14 DIAGNOSIS — Z8673 Personal history of transient ischemic attack (TIA), and cerebral infarction without residual deficits: Secondary | ICD-10-CM | POA: Diagnosis not present

## 2020-06-14 DIAGNOSIS — N184 Chronic kidney disease, stage 4 (severe): Secondary | ICD-10-CM | POA: Diagnosis not present

## 2020-06-14 DIAGNOSIS — I252 Old myocardial infarction: Secondary | ICD-10-CM | POA: Diagnosis not present

## 2020-06-14 DIAGNOSIS — N390 Urinary tract infection, site not specified: Secondary | ICD-10-CM | POA: Diagnosis not present

## 2020-06-14 DIAGNOSIS — B952 Enterococcus as the cause of diseases classified elsewhere: Secondary | ICD-10-CM | POA: Diagnosis not present

## 2020-06-14 DIAGNOSIS — Z7982 Long term (current) use of aspirin: Secondary | ICD-10-CM | POA: Diagnosis not present

## 2020-06-14 DIAGNOSIS — D631 Anemia in chronic kidney disease: Secondary | ICD-10-CM | POA: Diagnosis not present

## 2020-06-14 DIAGNOSIS — L03314 Cellulitis of groin: Secondary | ICD-10-CM | POA: Diagnosis not present

## 2020-06-14 DIAGNOSIS — I130 Hypertensive heart and chronic kidney disease with heart failure and stage 1 through stage 4 chronic kidney disease, or unspecified chronic kidney disease: Secondary | ICD-10-CM | POA: Diagnosis not present

## 2020-06-14 DIAGNOSIS — E1122 Type 2 diabetes mellitus with diabetic chronic kidney disease: Secondary | ICD-10-CM | POA: Diagnosis not present

## 2020-06-14 DIAGNOSIS — J449 Chronic obstructive pulmonary disease, unspecified: Secondary | ICD-10-CM | POA: Diagnosis not present

## 2020-06-14 DIAGNOSIS — I5040 Unspecified combined systolic (congestive) and diastolic (congestive) heart failure: Secondary | ICD-10-CM | POA: Diagnosis not present

## 2020-06-14 DIAGNOSIS — F17210 Nicotine dependence, cigarettes, uncomplicated: Secondary | ICD-10-CM | POA: Diagnosis not present

## 2020-06-14 DIAGNOSIS — D509 Iron deficiency anemia, unspecified: Secondary | ICD-10-CM | POA: Diagnosis not present

## 2020-06-17 ENCOUNTER — Ambulatory Visit (HOSPITAL_COMMUNITY)
Admission: RE | Admit: 2020-06-17 | Discharge: 2020-06-17 | Disposition: A | Discharge status: Home / Self Care | Payer: Medicare Other | Source: Ambulatory Visit | Attending: Internal Medicine | Admitting: Internal Medicine

## 2020-06-17 ENCOUNTER — Other Ambulatory Visit (HOSPITAL_COMMUNITY): Payer: Self-pay | Admitting: Internal Medicine

## 2020-06-17 ENCOUNTER — Encounter (HOSPITAL_COMMUNITY): Payer: Self-pay

## 2020-06-17 ENCOUNTER — Other Ambulatory Visit: Payer: Self-pay

## 2020-06-17 DIAGNOSIS — Z79899 Other long term (current) drug therapy: Secondary | ICD-10-CM | POA: Diagnosis not present

## 2020-06-17 DIAGNOSIS — Z7982 Long term (current) use of aspirin: Secondary | ICD-10-CM | POA: Diagnosis not present

## 2020-06-17 DIAGNOSIS — L02211 Cutaneous abscess of abdominal wall: Secondary | ICD-10-CM

## 2020-06-17 DIAGNOSIS — E785 Hyperlipidemia, unspecified: Secondary | ICD-10-CM | POA: Insufficient documentation

## 2020-06-17 DIAGNOSIS — F17210 Nicotine dependence, cigarettes, uncomplicated: Secondary | ICD-10-CM | POA: Diagnosis not present

## 2020-06-17 DIAGNOSIS — I252 Old myocardial infarction: Secondary | ICD-10-CM | POA: Diagnosis not present

## 2020-06-17 DIAGNOSIS — I255 Ischemic cardiomyopathy: Secondary | ICD-10-CM | POA: Insufficient documentation

## 2020-06-17 DIAGNOSIS — J449 Chronic obstructive pulmonary disease, unspecified: Secondary | ICD-10-CM | POA: Insufficient documentation

## 2020-06-17 DIAGNOSIS — D649 Anemia, unspecified: Secondary | ICD-10-CM | POA: Diagnosis not present

## 2020-06-17 DIAGNOSIS — K651 Peritoneal abscess: Secondary | ICD-10-CM | POA: Diagnosis not present

## 2020-06-17 DIAGNOSIS — I10 Essential (primary) hypertension: Secondary | ICD-10-CM | POA: Insufficient documentation

## 2020-06-17 DIAGNOSIS — E119 Type 2 diabetes mellitus without complications: Secondary | ICD-10-CM | POA: Insufficient documentation

## 2020-06-17 LAB — PROTIME-INR
INR: 1.1 (ref 0.8–1.2)
Prothrombin Time: 13.8 seconds (ref 11.4–15.2)

## 2020-06-17 LAB — GLUCOSE, CAPILLARY: Glucose-Capillary: 140 mg/dL — ABNORMAL HIGH (ref 70–99)

## 2020-06-17 MED ORDER — LIDOCAINE HCL 1 % IJ SOLN
INTRAMUSCULAR | Status: AC
  Filled 2020-06-17: qty 20

## 2020-06-17 MED ORDER — MIDAZOLAM HCL 2 MG/2ML IJ SOLN
INTRAMUSCULAR | Status: AC
  Filled 2020-06-17: qty 2

## 2020-06-17 MED ORDER — FENTANYL CITRATE (PF) 100 MCG/2ML IJ SOLN
INTRAMUSCULAR | Status: AC | PRN
Start: 2020-06-17 — End: 2020-06-17
  Administered 2020-06-17: 12.5 ug via INTRAVENOUS
  Administered 2020-06-17 (×3): 25 ug via INTRAVENOUS

## 2020-06-17 MED ORDER — MIDAZOLAM HCL 2 MG/2ML IJ SOLN
INTRAMUSCULAR | Status: AC | PRN
  Administered 2020-06-17 (×2): 0.5 mg via INTRAVENOUS
  Administered 2020-06-17: 1 mg via INTRAVENOUS

## 2020-06-17 MED ORDER — SODIUM CHLORIDE 0.9 % IV SOLN: INTRAVENOUS | Status: DC

## 2020-06-17 MED ORDER — FENTANYL CITRATE (PF) 100 MCG/2ML IJ SOLN
INTRAMUSCULAR | Status: AC
  Filled 2020-06-17: qty 2

## 2020-06-17 NOTE — Procedures (Addendum)
Interventional Radiology Procedure Note  Procedure: Image guided drain placement, LLQ abdominal wall fluid collection.  78F pigtail drain.  Complications: None  EBL: None Sample: Culture sent  Recommendations: - Routine drain care, record output - follow up Cx - routine wound care - 1 hr DC home - Patient will have follow up in [facility] for drain eval, with contrast enhanced CT of pelvis in 4-6 weeks.  Same day fluoro-guided drain injection, to identify any sinus tracts/residual abscess.  - Recommend follow up with surgery and wound care on schedule.   Signed,  Dulcy Fanny. Earleen Newport, DO

## 2020-06-17 NOTE — Discharge Instructions (Signed)
Percutaneous Abscess Drain, Care After This sheet gives you information about how to care for yourself after your procedure. Your health care provider may also give you more specific instructions. If you have problems or questions, contact your health care provider. What can I expect after the procedure? After your procedure, it is common to have:  A small amount of bruising and discomfort in the area where the drainage tube (catheter) was placed.  Sleepiness and fatigue. This should go away after the medicines you were given have worn off. Follow these instructions at home: Incision care  Follow instructions from your health care provider about how to take care of your incision. Make sure you: ? Wash your hands with soap and water before you change your bandage (dressing). If soap and water are not available, use hand sanitizer. ? Change your dressing as told by your health care provider. ? Leave stitches (sutures), skin glue, or adhesive strips in place. These skin closures may need to stay in place for 2 weeks or longer. If adhesive strip edges start to loosen and curl up, you may trim the loose edges. Do not remove adhesive strips completely unless your health care provider tells you to do that.  Check your incision area every day for signs of infection. Check for: ? More redness, swelling, or pain. ? More fluid or blood. ? Warmth. ? Pus or a bad smell. ? Fluid leaking from around your catheter (instead of fluid draining through your catheter). Catheter care   Follow instructions from your health care provider about emptying and cleaning your catheter and collection bag. You may need to clean the catheter every day so it does not clog.  If directed, write down the following information every time you empty your bag: ? The date and time. ? The amount of drainage. General instructions  Rest at home for 1-2 days after your procedure. Return to your normal activities as told by your  health care provider.  Do not take baths, swim, or use a hot tub for 24 hours after your procedure, or until your health care provider says that this is okay.  Take over-the-counter and prescription medicines only as told by your health care provider.  Keep all follow-up visits as told by your health care provider. This is important. Contact a health care provider if:  You have less than 10 mL of drainage a day for 2-3 days in a row, or as directed by your health care provider.  You have more redness, swelling, or pain around your incision area.  You have more fluid or blood coming from your incision area.  Your incision area feels warm to the touch.  You have pus or a bad smell coming from your incision area.  You have fluid leaking from around your catheter (instead of through your catheter).  You have a fever or chills.  You have pain that does not get better with medicine. Get help right away if:  Your catheter comes out.  You suddenly stop having drainage from your catheter.  You suddenly have blood in the fluid that is draining from your catheter.  You become dizzy or you faint.  You develop a rash.  You have nausea or vomiting.  You have difficulty breathing or you feel short of breath.  You develop chest pain.  You have problems with your speech or vision.  You have trouble balancing or moving your arms or legs. Summary  It is common to have a small   amount of bruising and discomfort in the area where the drainage tube (catheter) was placed.  You may be directed to record the amount of drainage from the bag every time you empty it.  Follow instructions from your health care provider about emptying and cleaning your catheter and collection bag. This information is not intended to replace advice given to you by your health care provider. Make sure you discuss any questions you have with your health care provider. Document Revised: 06/04/2017 Document  Reviewed: 05/14/2016 Elsevier Patient Education  [place] this sheet to all of your post-operative appointments while you have your drains.  Please measure your drains by CC's or ML's.  Make sure you drain and measure your JP Drains 3 times per day.  At the end of each day, add up totals for the left side and add up totals for the right side.    ( 9 am )     ( 3 pm )        ( 9 pm )                Date L  R  L  R  L  R  Total L/R

## 2020-06-17 NOTE — H&P (Signed)
Chief Complaint: Patient was seen in consultation today for pelvic abscess aspiration vs drain placement at the request of [place]  Referring Physician(s): Robson,Michael G Dr Shann Medal  Supervising Physician: Corrie Mckusick  Patient Status: [facility] - Out-pt  History of Present Illness: [name] is a 69 y.o. female   To ED 9/1: [facility] consulted for cellulitis; low abdominal/groin abscess Found down at SNF-- DKA Abscesses in groin-- spontaneously draining CT 9/1: IMPRESSION: 1. Extensive soft tissue stranding and thickening extending across the low anterior abdominal wall/panniculus with a large, heterogeneous phlegmon within the deep space of the right groin and pudendal soft tissues along the right adductor musculature and right inferior pubic ramus with small amount of soft tissue gas. Organized collection/abscess formation is difficult to ascertain in the absence of contrast media. Findings are concerning for severe cellulitis and aggressive soft tissue infection. Emergent surgical consultation is warranted.  9/2 note:  Preoperative diagnosis: Right perineal abscess Postop diagnosis: Right perineal abscess complex multiloculated Procedure: Incision drainage right perineal abscess complex Surgeon: Erroll Luna, MD  Discharged to SNF---  Now at home--- much better; but- Still with area of drainage from mid low abdomen and increased pain  11/29 CT : IMPRESSION: 1. Loculated fluid collection with thick wall surrounding edema in the anterior pelvic wall most consistent with an infected collection/abscess. Ultrasound may provide better evaluation and assist in drainage if clinically indicated. 2. Postsurgical changes of partial colon resection with ileotransverse anastomosis. No bowel obstruction.  Dr Dellia Nims has requested aspiration/drain placement of pelvic abscess Reviewed with Dr Earleen Newport-- approves procedure today as OP    Past Medical  History:  Diagnosis Date  . Anemia   . COPD (chronic obstructive pulmonary disease) ([place])   . Diabetes mellitus   . Hyperlipidemia   . Hypertension   . Ischemic cardiomyopathy 06/11/2013  . Low back pain   . NSTEMI (non-ST elevated myocardial infarction) ([place]) 06/11/2013  . Obesity   . Personal history of colon cancer   . Renal insufficiency     Past Surgical History:  Procedure Laterality Date  . COLECTOMY    . INCISION AND DRAINAGE PERIRECTAL ABSCESS N/A 03/07/2020   Procedure: IRRIGATION AND DEBRIDEMENT PERINEUM  AND PANNUS ABSCESS;  Surgeon: Erroll Luna, MD;  Location: [place];  Service: General;  Laterality: N/A;  . LEFT HEART CATHETERIZATION WITH CORONARY ANGIOGRAM N/A 06/09/2013   Procedure: LEFT HEART CATHETERIZATION WITH CORONARY ANGIOGRAM;  Surgeon: Ramond Dial, MD;  Location: [facility] CATH LAB;  Service: Cardiovascular;  Laterality: N/A;    Allergies: Tradjenta [linagliptin], Atorvastatin, Enalapril maleate, Farxiga [dapagliflozin], Hydrochlorothiazide, Kenalog [triamcinolone acetonide], Metformin and related, Propoxyphene n-acetaminophen, Simvastatin, and Spironolactone  Medications: Prior to Admission medications   Medication Sig Start Date End Date Taking? Authorizing Provider  amLODipine (NORVASC) 10 MG tablet TAKE ONE-HALF TABLET BY  MOUTH DAILY Patient taking differently: Take 5 mg by mouth daily. 12/19/19  Yes [name], Evie Lacks, MD  aspirin 81 MG chewable tablet Chew 1 tablet (81 mg total) by mouth daily. 06/11/13  Yes Erick Colace, NP  calcium carbonate (OS-CAL) 600 MG TABS tablet Take 600 mg by mouth 2 (two) times daily with a meal.   Yes [provider]  Cholecalciferol (VITAMIN D3) 50 MCG (2000 UT) capsule Take 1 capsule (2,000 Units total) by mouth daily. 09/21/19  Yes [name], Evie Lacks, MD  HYDROcodone-acetaminophen (NORCO) 7.5-325 MG tablet Take 0.5-1 tablets by mouth every 6 (six) hours as needed for severe pain. 04/11/20  Yes  [name],  Evie Lacks, MD  hydrOXYzine (ATARAX/VISTARIL) 25 MG tablet Take 1-2 tablets (25-50 mg total) by mouth every 8 (eight) hours as needed for itching. 12/08/16  Yes [name], Evie Lacks, MD  ondansetron (ZOFRAN) 4 MG tablet Take 1 tablet (4 mg total) by mouth every 8 (eight) hours as needed for nausea or vomiting. 03/28/19  Yes [name], Evie Lacks, MD  ONETOUCH ULTRA test strip USE TO TEST BLOOD SUGAR TWO TIMES A DAY AS DIRECTED 05/01/20  Yes [name], Evie Lacks, MD  potassium chloride SA (KLOR-CON) 20 MEQ tablet Take 1 tablet (20 mEq total) by mouth daily as needed (cramping). 03/14/20  Yes Vann, Jessica U, DO  rosuvastatin (CRESTOR) 5 MG tablet TAKE 1 TABLET BY MOUTH  DAILY AT 6PM Patient taking differently: Take 5 mg by mouth daily. 02/27/19  Yes [name], Evie Lacks, MD  torsemide (DEMADEX) 20 MG tablet Take 1 tablet (20 mg total) by mouth daily. 03/14/20  Yes Vann, Jessica U, DO  vitamin B-12 (CYANOCOBALAMIN) 1000 MCG tablet Take 1,000 mcg by mouth daily.   Yes [provider]  lansoprazole (PREVACID) 15 MG capsule Take 1 capsule (15 mg total) by mouth daily at 12 noon. Patient taking differently: Take 15 mg by mouth daily as needed (acid reflux).  01/10/19   [name], Evie Lacks, MD     Family History  Problem Relation Age of Onset  . Hypertension Other     Social History   Socioeconomic History  . Marital status: Single    Spouse name: Not on file  . Number of children: Not on file  . Years of education: Not on file  . Highest education level: Not on file  Occupational History  . Not on file  Tobacco Use  . Smoking status: Light Tobacco Smoker    Types: Cigarettes  . Smokeless tobacco: Never Used  Vaping Use  . Vaping Use: Never used  Substance and Sexual Activity  . Alcohol use: No  . Drug use: No  . Sexual activity: Not Currently    Birth control/protection: Post-menopausal  Other Topics Concern  . Not on file  Social History Narrative  . Not on file   Social  Determinants of Health   Financial Resource Strain: Not on file  Food Insecurity: Not on file  Transportation Needs: Not on file  Physical Activity: Not on file  Stress: Not on file  Social Connections: Not on file    Review of Systems: A 12 point ROS discussed and pertinent positives are indicated in the HPI above.  All other systems are negative.  Review of Systems  Constitutional: Negative for activity change, fatigue and fever.  Respiratory: Negative for cough and shortness of breath.   Gastrointestinal: Positive for abdominal pain. Negative for nausea.  Neurological: Negative for weakness.  Psychiatric/Behavioral: Negative for behavioral problems and confusion.    Vital Signs: BP (!) 151/69   Pulse 79   Temp 98.1 F (36.7 C) (Oral)   Resp 16   SpO2 100%   Physical Exam Vitals reviewed.  Cardiovascular:     Rate and Rhythm: Normal rate and regular rhythm.     Heart sounds: Normal heart sounds.  Pulmonary:     Effort: Pulmonary effort is normal.     Breath sounds: Normal breath sounds.  Abdominal:     Palpations: Abdomen is soft.     Tenderness: There is abdominal tenderness.  Musculoskeletal:        General: Normal range of motion.  Skin:  General: Skin is warm.  Neurological:     Mental Status: She is alert and oriented to person, place, and time.  Psychiatric:        Behavior: Behavior normal.     Imaging: CT ABDOMEN PELVIS WO CONTRAST  Result Date: 06/03/2020 CLINICAL DATA:  69 year old female with pelvic abscess. EXAM: CT ABDOMEN AND PELVIS WITHOUT CONTRAST TECHNIQUE: Multidetector CT imaging of the abdomen and pelvis was performed following the standard protocol without IV contrast. COMPARISON:  CT abdomen pelvis dated 03/06/2020. FINDINGS: Evaluation of this exam is limited in the absence of intravenous contrast. Lower chest: The visualized lung bases are clear. No intra-abdominal free air or free fluid. Hepatobiliary: The liver is unremarkable. No  intrahepatic biliary dilatation. The gallbladder is unremarkable. Pancreas: Unremarkable. No pancreatic ductal dilatation or surrounding inflammatory changes. Spleen: Normal in size without focal abnormality. Adrenals/Urinary Tract: The adrenal glands unremarkable. There is no hydronephrosis or nephrolithiasis on either side. The visualized ureters and urinary bladder appear unremarkable. Stomach/Bowel: Postsurgical changes of partial colon resection with ileotransverse anastomosis. There are small scattered colonic diverticula without active inflammatory changes. There is no bowel obstruction or active inflammation. Vascular/Lymphatic: Moderate aortoiliac atherosclerotic disease. The IVC is unremarkable. No portal venous gas. There is no adenopathy. Reproductive: The uterus is grossly unremarkable. No adnexal masses. Other: There is skin thickening of the anterior pelvic wall with diffuse subcutaneous edema. There is a loculated fluid collection with thick wall and several pockets of gas in the subcutaneous soft tissues of the anterior pelvic wall superficial to the rectus muscle measuring approximately 2 x 7 cm in axial dimensions and 5 cm in craniocaudal length. Linear band like densities extending from the skin to this collection may represent scarring. However, there appears to be a possible track from the fluid collection to the anterior skin to the left of the midline (66-68/3). Musculoskeletal: Degenerative changes of the spine. No acute osseous pathology. IMPRESSION: 1. Loculated fluid collection with thick wall surrounding edema in the anterior pelvic wall most consistent with an infected collection/abscess. Ultrasound may provide better evaluation and assist in drainage if clinically indicated. 2. Postsurgical changes of partial colon resection with ileotransverse anastomosis. No bowel obstruction. 3. Aortic Atherosclerosis (ICD10-I70.0). Electronically Signed   By: Anner Crete M.D.   On: 06/03/2020  22:24    Labs:  CBC: Recent Labs    03/10/20 0317 03/11/20 0357 03/11/20 2216 03/12/20 1530 03/13/20 1711  WBC 19.3* 17.0* 13.9*  --  13.3*  HGB 7.3* 7.2* 6.6* 8.9* 8.7*  HCT 23.2* 23.7* 21.9* 27.9* 27.8*  PLT 180 181 184  --  242    COAGS: No results for input(s): INR, APTT in the last 8760 hours.  BMP: Recent Labs    03/10/20 0317 03/11/20 0357 03/11/20 2216 03/13/20 1711  NA 139 139 139 137  K 4.0 3.7 3.7 3.6  CL 109 109 108 106  CO2 21* 24 22 21*  GLUCOSE 129* 132* 136* 192*  BUN 63* 55* 50* 25*  CALCIUM 8.4* 8.2* 8.0* 8.0*  CREATININE 1.81* 1.80* 1.78* 1.54*  GFRNONAA 28* 28* 29* 34*  GFRAA 32* 33* 33* 39*    LIVER FUNCTION TESTS: No results for input(s): BILITOT, AST, ALT, ALKPHOS, PROT, ALBUMIN in the last 8760 hours.  TUMOR MARKERS: No results for input(s): AFPTM, CEA, CA199, CHROMGRNA in the last 8760 hours.  Assessment and Plan:  Abdominal abscess I/D 9/2 in OR with Dr Brantley Stage DC to SNF-- now at home New abd pain  CT 11/29  revealing pelvic abscess Scheduled now for OP aspiration /drain placement Risks and benefits discussed with the patient including bleeding, infection, damage to adjacent structures, bowel perforation/fistula connection, and sepsis.  All of the patient's questions were answered, patient is agreeable to proceed. Consent signed and in chart.   Thank you for this interesting consult.  I greatly enjoyed meeting [name] and look forward to participating in their care.  A copy of this report was sent to the requesting provider on this date.  Electronically Signed: Lavonia Drafts, PA-C 06/17/2020, 10:13 AM   I spent a total of  30 Minutes   in face to face in clinical consultation, greater than 50% of which was counseling/coordinating care for pelvic abscess aspiration/drain

## 2020-06-19 DIAGNOSIS — I252 Old myocardial infarction: Secondary | ICD-10-CM | POA: Diagnosis not present

## 2020-06-19 DIAGNOSIS — L03314 Cellulitis of groin: Secondary | ICD-10-CM | POA: Diagnosis not present

## 2020-06-19 DIAGNOSIS — D631 Anemia in chronic kidney disease: Secondary | ICD-10-CM | POA: Diagnosis not present

## 2020-06-19 DIAGNOSIS — E785 Hyperlipidemia, unspecified: Secondary | ICD-10-CM | POA: Diagnosis not present

## 2020-06-19 DIAGNOSIS — K219 Gastro-esophageal reflux disease without esophagitis: Secondary | ICD-10-CM | POA: Diagnosis not present

## 2020-06-19 DIAGNOSIS — D509 Iron deficiency anemia, unspecified: Secondary | ICD-10-CM | POA: Diagnosis not present

## 2020-06-19 DIAGNOSIS — N390 Urinary tract infection, site not specified: Secondary | ICD-10-CM | POA: Diagnosis not present

## 2020-06-19 DIAGNOSIS — I5040 Unspecified combined systolic (congestive) and diastolic (congestive) heart failure: Secondary | ICD-10-CM | POA: Diagnosis not present

## 2020-06-19 DIAGNOSIS — I2510 Atherosclerotic heart disease of native coronary artery without angina pectoris: Secondary | ICD-10-CM | POA: Diagnosis not present

## 2020-06-19 DIAGNOSIS — F17210 Nicotine dependence, cigarettes, uncomplicated: Secondary | ICD-10-CM | POA: Diagnosis not present

## 2020-06-19 DIAGNOSIS — Z8673 Personal history of transient ischemic attack (TIA), and cerebral infarction without residual deficits: Secondary | ICD-10-CM | POA: Diagnosis not present

## 2020-06-19 DIAGNOSIS — I255 Ischemic cardiomyopathy: Secondary | ICD-10-CM | POA: Diagnosis not present

## 2020-06-19 DIAGNOSIS — E1122 Type 2 diabetes mellitus with diabetic chronic kidney disease: Secondary | ICD-10-CM | POA: Diagnosis not present

## 2020-06-19 DIAGNOSIS — N184 Chronic kidney disease, stage 4 (severe): Secondary | ICD-10-CM | POA: Diagnosis not present

## 2020-06-19 DIAGNOSIS — B952 Enterococcus as the cause of diseases classified elsewhere: Secondary | ICD-10-CM | POA: Diagnosis not present

## 2020-06-19 DIAGNOSIS — I130 Hypertensive heart and chronic kidney disease with heart failure and stage 1 through stage 4 chronic kidney disease, or unspecified chronic kidney disease: Secondary | ICD-10-CM | POA: Diagnosis not present

## 2020-06-19 DIAGNOSIS — Z7982 Long term (current) use of aspirin: Secondary | ICD-10-CM | POA: Diagnosis not present

## 2020-06-19 DIAGNOSIS — L02214 Cutaneous abscess of groin: Secondary | ICD-10-CM | POA: Diagnosis not present

## 2020-06-19 DIAGNOSIS — Z85038 Personal history of other malignant neoplasm of large intestine: Secondary | ICD-10-CM | POA: Diagnosis not present

## 2020-06-19 DIAGNOSIS — J449 Chronic obstructive pulmonary disease, unspecified: Secondary | ICD-10-CM | POA: Diagnosis not present

## 2020-06-21 DIAGNOSIS — N390 Urinary tract infection, site not specified: Secondary | ICD-10-CM | POA: Diagnosis not present

## 2020-06-21 DIAGNOSIS — J449 Chronic obstructive pulmonary disease, unspecified: Secondary | ICD-10-CM | POA: Diagnosis not present

## 2020-06-21 DIAGNOSIS — D509 Iron deficiency anemia, unspecified: Secondary | ICD-10-CM | POA: Diagnosis not present

## 2020-06-21 DIAGNOSIS — K219 Gastro-esophageal reflux disease without esophagitis: Secondary | ICD-10-CM | POA: Diagnosis not present

## 2020-06-21 DIAGNOSIS — I255 Ischemic cardiomyopathy: Secondary | ICD-10-CM | POA: Diagnosis not present

## 2020-06-21 DIAGNOSIS — L03314 Cellulitis of groin: Secondary | ICD-10-CM | POA: Diagnosis not present

## 2020-06-21 DIAGNOSIS — B952 Enterococcus as the cause of diseases classified elsewhere: Secondary | ICD-10-CM | POA: Diagnosis not present

## 2020-06-21 DIAGNOSIS — L02214 Cutaneous abscess of groin: Secondary | ICD-10-CM | POA: Diagnosis not present

## 2020-06-21 DIAGNOSIS — E1122 Type 2 diabetes mellitus with diabetic chronic kidney disease: Secondary | ICD-10-CM | POA: Diagnosis not present

## 2020-06-21 DIAGNOSIS — Z8673 Personal history of transient ischemic attack (TIA), and cerebral infarction without residual deficits: Secondary | ICD-10-CM | POA: Diagnosis not present

## 2020-06-21 DIAGNOSIS — I5040 Unspecified combined systolic (congestive) and diastolic (congestive) heart failure: Secondary | ICD-10-CM | POA: Diagnosis not present

## 2020-06-21 DIAGNOSIS — F17210 Nicotine dependence, cigarettes, uncomplicated: Secondary | ICD-10-CM | POA: Diagnosis not present

## 2020-06-21 DIAGNOSIS — I130 Hypertensive heart and chronic kidney disease with heart failure and stage 1 through stage 4 chronic kidney disease, or unspecified chronic kidney disease: Secondary | ICD-10-CM | POA: Diagnosis not present

## 2020-06-21 DIAGNOSIS — Z85038 Personal history of other malignant neoplasm of large intestine: Secondary | ICD-10-CM | POA: Diagnosis not present

## 2020-06-21 DIAGNOSIS — I252 Old myocardial infarction: Secondary | ICD-10-CM | POA: Diagnosis not present

## 2020-06-21 DIAGNOSIS — D631 Anemia in chronic kidney disease: Secondary | ICD-10-CM | POA: Diagnosis not present

## 2020-06-21 DIAGNOSIS — Z7982 Long term (current) use of aspirin: Secondary | ICD-10-CM | POA: Diagnosis not present

## 2020-06-21 DIAGNOSIS — I2510 Atherosclerotic heart disease of native coronary artery without angina pectoris: Secondary | ICD-10-CM | POA: Diagnosis not present

## 2020-06-21 DIAGNOSIS — E785 Hyperlipidemia, unspecified: Secondary | ICD-10-CM | POA: Diagnosis not present

## 2020-06-21 DIAGNOSIS — N184 Chronic kidney disease, stage 4 (severe): Secondary | ICD-10-CM | POA: Diagnosis not present

## 2020-06-22 LAB — AEROBIC/ANAEROBIC CULTURE W GRAM STAIN (SURGICAL/DEEP WOUND): Culture: NO GROWTH

## 2020-06-24 ENCOUNTER — Other Ambulatory Visit: Payer: Self-pay

## 2020-06-24 ENCOUNTER — Encounter (HOSPITAL_BASED_OUTPATIENT_CLINIC_OR_DEPARTMENT_OTHER): Payer: Medicare Other | Admitting: Internal Medicine

## 2020-06-24 DIAGNOSIS — L98492 Non-pressure chronic ulcer of skin of other sites with fat layer exposed: Secondary | ICD-10-CM | POA: Diagnosis not present

## 2020-06-24 DIAGNOSIS — E11628 Type 2 diabetes mellitus with other skin complications: Secondary | ICD-10-CM | POA: Diagnosis not present

## 2020-06-24 DIAGNOSIS — L02211 Cutaneous abscess of abdominal wall: Secondary | ICD-10-CM | POA: Diagnosis not present

## 2020-06-24 DIAGNOSIS — L02214 Cutaneous abscess of groin: Secondary | ICD-10-CM | POA: Diagnosis not present

## 2020-06-24 NOTE — Progress Notes (Signed)
[name] ([id number]) Visit Report for 06/24/2020 HPI Details Patient Name: Date of Service: [name], [name] 06/24/2020 12:45 PM Medical Record Number: [id number] Patient Account Number: [id number] Date of Birth/Sex: Treating RN: [birth year] (69 y.o. [name] Primary Care Provider: [name] Other Clinician: Referring Provider: Treating Provider/Extender: Thayer Headings in Treatment: 5 History of Present Illness HPI Description: 04/19/2020 ADMISSION this is a patient who became acutely ill with sepsis in late March. She required admission to hospital from 03/05/2020 through 9//9/21 with abscesses in her groin. She was septic with Enterococcus faecalis she is treated initially with Vanco and Zosyn but discharged on amoxicillin. She required surgical debridement of 3 areas including 1 in the anterior thigh and 2 on the right buttock. She was discharged to [facility]. 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 She was seen by plastic surgery including Dr. Heber [place] and then by Phoebe Sharps one of their PAs twice. At that point they described the areas on the right inner groin and then 3 new abdominal wounds. The last culture I can see done by plastic surgery on 11/3 showed both Klebsiella and Enterobacter. She was on Bactrim for a. And more recently is now on Amoxil presumably for a Streptococcus species on 11/2. She is still taking Amoxil. She has not been feeling systemically unwell no fever no nausea she is back at home. She has home health putting calcium alginate on the wounds. I have included the original CT scan report before her surgery. I am not completely certain that the extent of this was appreciated. This may have accounted for why she developed recurrent areas after discharge from our hospital on her lower abdomen which is clearly  commented on in the CT scan report. I am not sure I could paraphrase this any better ADDENDUM REPORT 03/06/2020 21:05 : ADDENDUM: Critical Value/emergent results were called by telephone at the time of interpretation on 03/06/2020 at 9:05 pm to provider Sharlet Salina NP , who verbally acknowledged these results. Electronically Signed By: Lovena Le M.D. On: 03/06/2020 21:05 Signed by Briant Cedar, MD on 03/06/2020 9:08 PM Narrative and Impression CLINICAL DATA: Abdominal pain, abdominal and groin cellulitis EXAM: CT ABDOMEN AND PELVIS WITHOUT CONTRAST TECHNIQUE: Multidetector CT imaging of the abdomen and pelvis was performed following the standard protocol without IV contrast. COMPARISON: None. FINDINGS: Lower chest: Lung bases are clear. Cardiac size at the upper limits normal. Coronary artery calcifications are present. Suspect calcification of the mitral annulus as well. Hypoattenuation of the cardiac blood pool may reflect a mild anemia. Hepatobiliary: No visible concerning liver lesion within the limitations of this unenhanced of motion degraded CT Smooth liver . surface contour. Normal liver attenuation. Gallbladder is unremarkable. Question some biliary ductal dilatation difficult to fully discern given the lack of contrast media and extensive motion artifact. The extrahepatic common bile duct may measure up to 1.5 cm at the level of the pancreatic head. No visible intraductal gallstones. Pancreas: Poorly assessed given extensive motion artifact photon starvation. No pancreatic ductal dilatation or surrounding inflammatory changes. Spleen: Normal in size. No concerning splenic lesions. Splenic hilar vascular calcifications are noted. Adrenals/Urinary Tract: Adrenal glands are unremarkable. Kidneys are symmetric in size and normally located. Question some mild perinephric stranding versus volume averaging in the setting of motion artifact. No visible concerning renal lesion.  No urolithiasis or hydronephrosis. Urinary bladder is  unremarkable. Stomach/Bowel: Small sliding-type hiatal hernia. Distal stomach and duodenum are unremarkable. No small or large bowel thickening or dilatation. No evidence of bowel obstruction. Appendix is not visualized. No focal inflammation the vicinity of the cecum to suggest an occult appendicitis. Vascular/Lymphatic: Atherosclerotic calcifications within the abdominal aorta and branch vessels. No aneurysm or ectasia. No enlarged abdominopelvic lymph nodes. Likely reactive adenopathy seen in the inguinal regions. Reproductive: Anteverted uterus. Question endometrial thickening, greater than expected for patient age measuring up to 10 mm in struck thickness. No concerning adnexal lesions. Few parametrial calcifications are atypical senescent finding Other: There is extensive soft tissue stranding and thickening extending across the low anterior abdominal wall/panniculus. Large lobular phlegmon in the right groin/pudendal tissues superficial to the right adductor musculature and right inferior pubic ramus anteriorly with small amount of soft tissue gas. Is region is difficult to accurately measure given the lobular margins of med does extend up to 4.3 x 6.2 cm in maximal transaxial dimensions. Organized collection/abscess formation is difficult to ascertain in the absence of contrast media. Musculoskeletal: Soft tissue thickening and edematous changes of the adductor compartment of the right thigh could reflect some reactive or inflammatory myositis. No discernible intramuscular collection is seen. No acute or suspicious osseous lesions. Multilevel degenerative changes in the spine, hips and pelvis. Mild grade 1 anterolisthesis L4 on 5. No spondylolysis is evident. IMPRESSION: 1. Extensive soft tissue stranding and thickening extending across the low anterior abdominal wall/panniculus with a large, heterogeneous phlegmon within  the deep space of the right groin and pudendal soft tissues along the right adductor musculature and right inferior pubic ramus with small amount of soft tissue gas. Organized collection/abscess formation is difficult to ascertain in the absence of contrast media. Findings are concerning for severe cellulitis and aggressive soft tissue infection. Emergent surgical consultation is warranted. 2. Soft tissue thickening and edematous changes of the adductor compartment of the right thigh could reflect some reactive or inflammatory myositis. No discernible intramuscular collection is seen. 3. Question endometrial thickening, greater than expected for patient age measuring up to 10 mm in thickness. Recommend further evaluation with outpatient pelvic ultrasound when patient is able to better tolerate. 4. Aortic Atherosclerosis (ICD10-I70.0). Currently attempting to contact the ordering provider with a critical value result. Addendum will be submitted upon case discussion. Electronically Signed: By: Lovena Le M.D. On: 03/06/2020 21:02 05/27/2020 upon evaluation today patient appears to be doing better in my opinion with regard to some of the abscess areas currently. She has her CT scan coming up shortly over the next week. With that being said she has been on Augmentin 500 mg which has seemed to help her to be honest. We did have a culture as well that was obtained at the last visit and showed Streptococcus anginosus with that being said I do believe that it may be helpful for [place] to go ahead and extend her Augmentin which is on Thursday for another 2 weeks just to make sure that we keep things under control since she does seem to be doing better the patient is in agreement with that plan. 12/6; 2-week follow-up. The CT scan showed a sizable abscess in the anterior pelvic wall. I contacted interventional radiology Dr. Earleen Newport and we are arranging to have this drained. In the meantime her culture  showed strep anginosis and she is on Augmentin. Everything in the right medial thigh and groin closed over. The area on her right abdominal wall was closed over however the area on the  superior part of her left symphysis is still open but with less depth. There is no purulent drainage The patient is going to need the abscess drained although we still do not yet have a firm appointment from interventional radiology. We will check into this either today or tomorrow. I would like to make sure that we get a culture at the time they do this. She has enough antibiotics until Friday 12/20; patient returns to our clinic. She had a drain placed and she still has a drainage bag in place. She is draining between 20 and 40 cc/day. Unfortunately the culture at the time this was placed was negative which is a bit surprising and she has continued to drain on a daily basis. This was placed on 06/17/2020 by Dr. Earleen Newport of interventional radiology. According the patient she is going to have another CT scan done early in the new year to check on possible reaccumulations of fluid. Not surprisingly her draining areas on the left upper medial thigh as well as the top of the symphysis pubis of closed there is no open wound. I have reviewed her previous cultures on 8/31 Streptococcus anginosus. On 10/28 I believe her initial operative consult showed Streptococcus anginosus, Enterococcus faecalis and Klebsiella. On arrival in this clinic again Streptococcus anginosus. Electronic Signature(s) Signed: 06/24/2020 7:23:52 PM By: Linton Ham MD Entered By: Linton Ham on 06/24/2020 13:19:27 -------------------------------------------------------------------------------- Physical Exam Details Patient Name: Date of Service: Alferd Patee A. 06/24/2020 12:45 PM Medical Record Number: [id number] Patient Account Number: [id number] Date of Birth/Sex: Treating RN: 09-10-1950 (69 y.o. [name] Primary Care  Provider: [name] Other Clinician: Referring Provider: Treating Provider/Extender: Thayer Headings in Treatment: 5 Constitutional Patient is hypertensive.. Supine Blood Pressure is within target range for patient.[name] Respirations regular, non-labored and within target range.. Temperature is normal and within the target range for the patient.[name] Appears in no distress. Gastrointestinal (GI) Nontender. Notes Wound exam; the patient has no open wound. Electronic Signature(s) Signed: 06/24/2020 7:23:52 PM By: Linton Ham MD Entered By: Linton Ham on 06/24/2020 13:20:09 -------------------------------------------------------------------------------- Physician Orders Details Patient Name: Date of Service: Alferd Patee A. 06/24/2020 12:45 PM Medical Record Number: [id number] Patient Account Number: [id number] Date of Birth/Sex: Treating RN: 08-03-1950 (69 y.o. 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 Bathing/ Shower/ Hygiene May shower and wash wound with soap and water. - on days that dressing is changed Home Health No change in wound care orders this week; continue Home Health for wound care. May utilize formulary equivalent dressing for wound treatment orders unless otherwise specified. - [facility] Other Home Health Orders/Instructions: - Continue drain care per Dr. Earleen Newport instruction Patient Medications llergies: atorvastatin, enalapril, Farxiga, Kenalog, metformin, propoxyphene napsylate, simvastatin,  spironolactone A Notifications Medication Indication Start End 06/24/2020 Levaquin DOSE oral 500 mg tablet - 1 tablet oral daily for 10 days Electronic Signature(s) Signed: 06/24/2020 2:53:29 PM By: Linton Ham MD Entered By: Linton Ham on 06/24/2020 14:53:28 -------------------------------------------------------------------------------- Problem List Details Patient Name: Date of Service: Alferd Patee A. 06/24/2020 12:45 PM Medical Record Number: [id number] Patient Account Number: [id number] Date of Birth/Sex: Treating RN: [birth year] (69 y.o. 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 06/24/2020 12:45 PM Medical Record Number: [id number] Patient Account Number: [id number] Date of Birth/Sex: Treating RN: [birth year] (69 y.o. [name] Primary Care Provider: [name] Other Clinician: Referring Provider: Treating Provider/Extender: Thayer Headings in Treatment: 5 Subjective History of Present Illness (HPI) 04/19/2020 ADMISSION this is a patient who became acutely ill with sepsis in late March. She required admission to hospital from 03/05/2020 through 9//9/21 with abscesses in her groin. She was  septic with Enterococcus faecalis she is treated initially with Vanco and Zosyn but discharged on amoxicillin. She required surgical debridement of 3 areas including 1 in the anterior thigh and 2 on the right buttock. She was discharged to [facility]. 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 She was seen by plastic surgery including Dr. Heber [place] and then by Phoebe Sharps one of their PAs twice. At that point they described the areas on the right inner groin and then 3 new abdominal wounds. The last culture I can see done by plastic surgery on 11/3 showed both Klebsiella and Enterobacter. She was on Bactrim for a. And more recently is now on Amoxil presumably for a Streptococcus species on 11/2. She is still taking Amoxil. She has not been feeling systemically unwell no fever no nausea she is back at home. She has home health putting calcium alginate on the wounds. I have included the original CT scan report before her surgery. I am not completely certain that the extent of this was appreciated. This may have accounted for why she developed recurrent areas after discharge from our hospital on her lower abdomen which is clearly commented on in the CT scan report. I am not sure I could paraphrase this any better ADDENDUM REPORT 03/06/2020 21:05 : ADDENDUM: Critical Value/emergent results were called by telephone at the time of interpretation on 03/06/2020 at 9:05 pm to provider Sharlet Salina NP , who verbally acknowledged these results. Electronically Signed By: Lovena Le M.D. On: 03/06/2020 21:05 Signed by Briant Cedar, MD on 03/06/2020 9:08 PM Narrative and Impression CLINICAL DATA: Abdominal pain, abdominal and groin cellulitis EXAM: CT ABDOMEN AND PELVIS WITHOUT CONTRAST TECHNIQUE: Multidetector CT imaging of the abdomen and pelvis was performed following the standard protocol without IV contrast. COMPARISON:  None. FINDINGS: Lower chest: Lung bases are clear. Cardiac size at the upper limits normal. Coronary artery calcifications are present. Suspect calcification of the mitral annulus as well. Hypoattenuation of the cardiac blood pool may reflect a mild anemia. Hepatobiliary: No visible concerning liver lesion within the limitations of this unenhanced of motion degraded CT Smooth liver . surface contour. Normal liver attenuation. Gallbladder is unremarkable. Question some biliary ductal dilatation difficult to fully discern given the lack of contrast media and extensive motion artifact. The extrahepatic common bile duct may measure up to 1.5 cm at the level of the pancreatic head.  No visible intraductal gallstones. Pancreas: Poorly assessed given extensive motion artifact photon starvation. No pancreatic ductal dilatation or surrounding inflammatory changes. Spleen: Normal in size. No concerning splenic lesions. Splenic hilar vascular calcifications are noted. Adrenals/Urinary Tract: Adrenal glands are unremarkable. Kidneys are symmetric in size and normally located. Question some mild perinephric stranding versus volume averaging in the setting of motion artifact. No visible concerning renal lesion. No urolithiasis or hydronephrosis. Urinary bladder is unremarkable. Stomach/Bowel: Small sliding-type hiatal hernia. Distal stomach and duodenum are unremarkable. No small or large bowel thickening or dilatation. No evidence of bowel obstruction. Appendix is not visualized. No focal inflammation the vicinity of the cecum to suggest an occult appendicitis. Vascular/Lymphatic: Atherosclerotic calcifications within the abdominal aorta and branch vessels. No aneurysm or ectasia. No enlarged abdominopelvic lymph nodes. Likely reactive adenopathy seen in the inguinal regions. Reproductive: Anteverted uterus. Question endometrial thickening, greater than expected for patient age measuring up to  10 mm in struck thickness. No concerning adnexal lesions. Few parametrial calcifications are atypical senescent finding Other: There is extensive soft tissue stranding and thickening extending across the low anterior abdominal wall/panniculus. Large lobular phlegmon in the right groin/pudendal tissues superficial to the right adductor musculature and right inferior pubic ramus anteriorly with small amount of soft tissue gas. Is region is difficult to accurately measure given the lobular margins of med does extend up to 4.3 x 6.2 cm in maximal transaxial dimensions. Organized collection/abscess formation is difficult to ascertain in the absence of contrast media. Musculoskeletal: Soft tissue thickening and edematous changes of the adductor compartment of the right thigh could reflect some reactive or inflammatory myositis. No discernible intramuscular collection is seen. No acute or suspicious osseous lesions. Multilevel degenerative changes in the spine, hips and pelvis. Mild grade 1 anterolisthesis L4 on 5. No spondylolysis is evident. IMPRESSION: 1. Extensive soft tissue stranding and thickening extending across the low anterior abdominal wall/panniculus with a large, heterogeneous phlegmon within the deep space of the right groin and pudendal soft tissues along the right adductor musculature and right inferior pubic ramus with small amount of soft tissue gas. Organized collection/abscess formation is difficult to ascertain in the absence of contrast media. Findings are concerning for severe cellulitis and aggressive soft tissue infection. Emergent surgical consultation is warranted. 2. Soft tissue thickening and edematous changes of the adductor compartment of the right thigh could reflect some reactive or inflammatory myositis. No discernible intramuscular collection is seen. 3. Question endometrial thickening, greater than expected for patient age measuring up to 10 mm in  thickness. Recommend further evaluation with outpatient pelvic ultrasound when patient is able to better tolerate. 4. Aortic Atherosclerosis (ICD10-I70.0). Currently attempting to contact the ordering provider with a critical value result. Addendum will be submitted upon case discussion. Electronically Signed: By: Lovena Le M.D. On: 03/06/2020 21:02 05/27/2020 upon evaluation today patient appears to be doing better in my opinion with regard to some of the abscess areas currently. She has her CT scan coming up shortly over the next week. With that being said she has been on Augmentin 500 mg which has seemed to help her to be honest. We did have a culture as well that was obtained at the last visit and showed Streptococcus anginosus with that being said I do believe that it may be helpful for [place] to go ahead and extend her Augmentin which is on Thursday for another 2 weeks just to make sure that we keep things under control since she does seem to be doing better  the patient is in agreement with that plan. 12/6; 2-week follow-up. The CT scan showed a sizable abscess in the anterior pelvic wall. I contacted interventional radiology Dr. Earleen Newport and we are arranging to have this drained. In the meantime her culture showed strep anginosis and she is on Augmentin. Everything in the right medial thigh and groin closed over. The area on her right abdominal wall was closed over however the area on the superior part of her left symphysis is still open but with less depth. There is no purulent drainage The patient is going to need the abscess drained although we still do not yet have a firm appointment from interventional radiology. We will check into this either today or tomorrow. I would like to make sure that we get a culture at the time they do this. She has enough antibiotics until Friday 12/20; patient returns to our clinic. She had a drain placed and she still has a drainage bag in place. She is  draining between 20 and 40 cc/day. Unfortunately the culture at the time this was placed was negative which is a bit surprising and she has continued to drain on a daily basis. This was placed on 06/17/2020 by Dr. Earleen Newport of interventional radiology. According the patient she is going to have another CT scan done early in the new year to check on possible reaccumulations of fluid. Not surprisingly her draining areas on the left upper medial thigh as well as the top of the symphysis pubis of closed there is no open wound. I have reviewed her previous cultures on 8/31 Streptococcus anginosus. On 10/28 I believe her initial operative consult showed Streptococcus anginosus, Enterococcus faecalis and Klebsiella. On arrival in this clinic again Streptococcus anginosus. Objective Constitutional Patient is hypertensive.. Supine Blood Pressure is within target range for patient.[name] Respirations regular, non-labored and within target range.. Temperature is normal and within the target range for the patient.[name] Appears in no distress. Vitals Time Taken: 12:52 PM, Height: 62 in, Weight: 172 lbs, BMI: 31.5, Temperature: 98.0 F, Pulse: 83 bpm, Respiratory Rate: 18 breaths/min, Blood Pressure: 150/82 mmHg. Gastrointestinal (GI) Nontender. General Notes: Wound exam; the patient has no open wound. Integumentary (Hair, Skin) Wound #1 status is Healed - Epithelialized. Original cause of wound was Gradually Appeared. The wound is located on the Pubis. The wound measures 0cm length x 0cm width x 0cm depth; 0cm^2 area and 0cm^3 volume. 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 Bathing/ Shower/ Hygiene: May shower and wash wound with soap and water. - on days that dressing is changed Home Health: No change in wound care orders this week;  continue Home Health for wound care. May utilize formulary equivalent dressing for wound treatment orders unless otherwise specified. - [facility] Other Home Health Orders/Instructions: - Continue drain care per Dr. Earleen Newport instruction 1. Still draining 20 to 45 cc pretty steadily in the drainage bag 2. Predictably her drainage areas have closed over. 3. Unfortunately the most recent culture is negative although I only discontinued the Augmentin for 5 days before this 4. Possibly like to start her back on Levaquin to see if we can reduce the drainage in this area fairly clearly this was infectious 5. I guess the other issue would be is there is something connecting to the abscess beyond what we know about. If so it is not been identified in her CT  scans Electronic Signature(s) Signed: 06/24/2020 7:23:52 PM By: Linton Ham MD Entered By: Linton Ham on 06/24/2020 13:26:08 -------------------------------------------------------------------------------- SuperBill Details Patient Name: Date of Service: Saverio Danker 06/24/2020 Medical Record Number: [id number] Patient Account Number: [id number] Date of Birth/Sex: Treating RN: 14-Jan-1951 (69 y.o. 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

## 2020-06-25 DIAGNOSIS — E785 Hyperlipidemia, unspecified: Secondary | ICD-10-CM | POA: Diagnosis not present

## 2020-06-25 DIAGNOSIS — F17210 Nicotine dependence, cigarettes, uncomplicated: Secondary | ICD-10-CM | POA: Diagnosis not present

## 2020-06-25 DIAGNOSIS — D509 Iron deficiency anemia, unspecified: Secondary | ICD-10-CM | POA: Diagnosis not present

## 2020-06-25 DIAGNOSIS — I252 Old myocardial infarction: Secondary | ICD-10-CM | POA: Diagnosis not present

## 2020-06-25 DIAGNOSIS — D631 Anemia in chronic kidney disease: Secondary | ICD-10-CM | POA: Diagnosis not present

## 2020-06-25 DIAGNOSIS — I5040 Unspecified combined systolic (congestive) and diastolic (congestive) heart failure: Secondary | ICD-10-CM | POA: Diagnosis not present

## 2020-06-25 DIAGNOSIS — I255 Ischemic cardiomyopathy: Secondary | ICD-10-CM | POA: Diagnosis not present

## 2020-06-25 DIAGNOSIS — I130 Hypertensive heart and chronic kidney disease with heart failure and stage 1 through stage 4 chronic kidney disease, or unspecified chronic kidney disease: Secondary | ICD-10-CM | POA: Diagnosis not present

## 2020-06-25 DIAGNOSIS — K219 Gastro-esophageal reflux disease without esophagitis: Secondary | ICD-10-CM | POA: Diagnosis not present

## 2020-06-25 DIAGNOSIS — Z85038 Personal history of other malignant neoplasm of large intestine: Secondary | ICD-10-CM | POA: Diagnosis not present

## 2020-06-25 DIAGNOSIS — E1122 Type 2 diabetes mellitus with diabetic chronic kidney disease: Secondary | ICD-10-CM | POA: Diagnosis not present

## 2020-06-25 DIAGNOSIS — L03314 Cellulitis of groin: Secondary | ICD-10-CM | POA: Diagnosis not present

## 2020-06-25 DIAGNOSIS — L02214 Cutaneous abscess of groin: Secondary | ICD-10-CM | POA: Diagnosis not present

## 2020-06-25 DIAGNOSIS — N390 Urinary tract infection, site not specified: Secondary | ICD-10-CM | POA: Diagnosis not present

## 2020-06-25 DIAGNOSIS — Z7982 Long term (current) use of aspirin: Secondary | ICD-10-CM | POA: Diagnosis not present

## 2020-06-25 DIAGNOSIS — B952 Enterococcus as the cause of diseases classified elsewhere: Secondary | ICD-10-CM | POA: Diagnosis not present

## 2020-06-25 DIAGNOSIS — N184 Chronic kidney disease, stage 4 (severe): Secondary | ICD-10-CM | POA: Diagnosis not present

## 2020-06-25 DIAGNOSIS — J449 Chronic obstructive pulmonary disease, unspecified: Secondary | ICD-10-CM | POA: Diagnosis not present

## 2020-06-25 DIAGNOSIS — I2510 Atherosclerotic heart disease of native coronary artery without angina pectoris: Secondary | ICD-10-CM | POA: Diagnosis not present

## 2020-06-25 DIAGNOSIS — Z8673 Personal history of transient ischemic attack (TIA), and cerebral infarction without residual deficits: Secondary | ICD-10-CM | POA: Diagnosis not present

## 2020-06-25 NOTE — Progress Notes (Signed)
[name], [name] ([id number]) Visit Report for 06/24/2020 Arrival Information Details Patient Name: Date of Service: [name], [name] 06/24/2020 12:45 PM Medical Record Number: [id number] Patient Account Number: [id number] Date of Birth/Sex: Treating RN: 1951/05/22 (69 y.o. [name] Primary Care Lavette Yankovich: [name] Other Clinician: Referring Tajh Livsey: Treating Terel Bann/Extender: Thayer Headings in Treatment: 5 Visit Information History Since Last Visit All ordered tests and consults were completed: Yes Patient Arrived: Ambulatory Added or deleted any medications: No Arrival Time: 12:49 Any new allergies or adverse reactions: No Accompanied By: self Had a fall or experienced change in No Transfer Assistance: None activities of daily living that may affect Patient Identification Verified: Yes risk of falls: Secondary Verification Process Completed: Yes Signs or symptoms of abuse/neglect since last visito No Patient Requires Transmission-Based Precautions: No Hospitalized since last visit: No Patient Has Alerts: No Implantable device outside of the clinic excluding No cellular tissue based products placed in the center since last visit: Has Dressing in Place as Prescribed: Yes Pain Present Now: No Notes JP drain placed by IR to RLQ. Electronic Signature(s) Signed: 06/24/2020 5:23:06 PM By: Deon Pilling Entered By: Deon Pilling on 06/24/2020 12:57:37 -------------------------------------------------------------------------------- Clinic Level of Care Assessment Details Patient Name: Date of Service: [name], [name] 06/24/2020 12:45 PM Medical Record Number: [id number] Patient Account Number: [id number] Date of Birth/Sex: Treating RN: September 30, 1950 (69 y.o. [name] Primary Care Daxon Kyne: [name] Other Clinician: Referring Leili Eskenazi: Treating Saranne Crislip/Extender: Thayer Headings in Treatment: 5 Clinic Level of Care Assessment Items TOOL 4 Quantity Score X- 1 0 Use when only an EandM is performed on FOLLOW-UP visit ASSESSMENTS - Nursing Assessment / Reassessment X- 1 10 Reassessment of Co-morbidities (includes updates in patient status) X- 1 5 Reassessment of Adherence to Treatment Plan ASSESSMENTS - Wound and Skin A ssessment / Reassessment X - Simple Wound Assessment / Reassessment - one wound 1 5 _0  - 0 Complex Wound Assessment / Reassessment - multiple wounds _1  - 0 Dermatologic / Skin Assessment (not related to wound area) ASSESSMENTS - Focused Assessment _2  - 0 Circumferential Edema Measurements - multi extremities _3  - 0 Nutritional Assessment / Counseling / Intervention _4  - 0 Lower Extremity Assessment (monofilament, tuning fork, pulses) _5  - 0 Peripheral Arterial Disease Assessment (using hand held doppler) ASSESSMENTS - Ostomy and/or Continence Assessment and Care _6  - 0 Incontinence Assessment and Management _7  - 0 Ostomy Care Assessment and Management (repouching, etc.) PROCESS - Coordination of Care X - Simple Patient / Family Education for ongoing care 1 15 _8  - 0 Complex (extensive) Patient / Family Education for ongoing care X- 1 10 Staff obtains Programmer, systems, Records, T Results / Process Orders est X- 1 10 Staff telephones HHA, Nursing Homes / Clarify orders / etc _9  - 0 Routine Transfer to another Facility (non-emergent condition) _10  - 0 Routine Hospital Admission (non-emergent condition) _11  - 0 New Admissions / Biomedical engineer / Ordering NPWT Apligraf, etc. , _12  - 0 Emergency Hospital Admission (emergent condition) X- 1 10 Simple Discharge Coordination _13  - 0 Complex (extensive) Discharge Coordination PROCESS - Special Needs _14  - 0 Pediatric / Minor Patient Management _15  - 0 Isolation Patient Management _16  - 0 Hearing / Language / Visual special needs _17  - 0 Assessment of Community assistance  (transportation, D/C planning, etc.) _18  - 0 Additional assistance / Altered mentation _19  - 0 Support Surface(s) Assessment (bed, cushion, seat, etc.) INTERVENTIONS - Wound Cleansing / Measurement X -  Simple Wound Cleansing - one wound 1 5 _0  - 0 Complex Wound Cleansing - multiple wounds X- 1 5 Wound Imaging (photographs - any number of wounds) _1  - 0 Wound Tracing (instead of photographs) X- 1 5 Simple Wound Measurement - one wound _2  - 0 Complex Wound Measurement - multiple wounds INTERVENTIONS - Wound Dressings _3  - 0 Small Wound Dressing one or multiple wounds _4  - 0 Medium Wound Dressing one or multiple wounds _5  - 0 Large Wound Dressing one or multiple wounds <NATFTDDUKGURKYHC>_6<\/CBJSEGBTDVVOHYWV>_3  - 0 Application of Medications - topical <XTGGYIRSWNIOEVOJ>_5<\/KKXFGHWEXHBZJIRC>_7  - 0 Application of Medications - injection INTERVENTIONS - Miscellaneous _8  - 0 External ear exam _9  - 0 Specimen Collection (cultures, biopsies, blood, body fluids, etc.) _10  - 0 Specimen(s) / Culture(s) sent or taken to Lab for analysis _11  - 0 Patient Transfer (multiple staff / Civil Service fast streamer / Similar devices) _12  - 0 Simple Staple / Suture removal (25 or less) _13  - 0 Complex Staple / Suture removal (26 or more) _14  - 0 Hypo / Hyperglycemic Management (close monitor of Blood Glucose) _15  - 0 Ankle / Brachial Index (ABI) - do not check if billed separately X- 1 5 Vital Signs Has the patient been seen at the hospital within the last three years: Yes Total Score: 85 Level Of Care: New/Established - Level 3 Electronic Signature(s) Signed: 06/24/2020 5:38:15 PM By: Levan Hurst RN, BSN Entered By: Levan Hurst on 06/24/2020 13:19:35 -------------------------------------------------------------------------------- Lower Extremity Assessment Details Patient Name: Date of Service: Alferd Patee A. 06/24/2020 12:45 PM Medical Record Number: [id number] Patient Account Number: [id number] Date of Birth/Sex: Treating RN: [birth year] (69 y.o. [name] Primary Care Raynold Blankenbaker: [name] Other Clinician: Referring Linford Quintela: Treating Trevonn Hallum/Extender: Thayer Headings in Treatment: 5 Electronic Signature(s) Signed: 06/24/2020 5:23:06 PM By: Deon Pilling Entered By: Deon Pilling on 06/24/2020 12:57:56 -------------------------------------------------------------------------------- Multi Wound Chart Details Patient Name: Date of Service: Alferd Patee A. 06/24/2020 12:45 PM Medical Record Number: [id number] Patient Account Number: [id number] Date of Birth/Sex: Treating RN: 04-06-1951 (69 y.o. [name] Primary Care Hessie Varone: [name] Other Clinician: Referring Anicka Stuckert: Treating Majed Pellegrin/Extender: Thayer Headings in Treatment: 5 Vital Signs Height(in): 62 Pulse(bpm): 83 Weight(lbs): 172 Blood Pressure(mmHg): 150/82 Body Mass Index(BMI): 31 Temperature(F): 98.0 Respiratory Rate(breaths/min): 18 Photos: [1:No Photos Pubis] [N/A:N/A N/A] Wound Location: [1:Gradually Appeared] [N/A:N/A] Wounding Event: [1:Abscess] [N/A:N/A] Primary Etiology: [1:02/04/2020] [N/A:N/A] Date Acquired: [1:5] [N/A:N/A] Weeks of Treatment: [1:Healed - Epithelialized] [N/A:N/A] Wound Status: [1:0x0x0] [N/A:N/A] Measurements L x W x D (cm) [1:0] [N/A:N/A] A (cm) : rea [1:0] [N/A:N/A] Volume (cm) : [1:100.00%] [N/A:N/A] % Reduction in A rea: [1:100.00%] [N/A:N/A] % Reduction in Volume: [1:Full Thickness Without Exposed] [N/A:N/A] Classification: [1:Support Structures] Treatment Notes Electronic Signature(s) Signed: 06/24/2020 5:38:15 PM By: Levan Hurst RN, BSN Signed: 06/24/2020 7:23:52 PM By: Linton Ham MD Entered By: Linton Ham on 06/24/2020 13:15:40 -------------------------------------------------------------------------------- Multi-Disciplinary Care Plan Details Patient Name: Date of Service: Alferd Patee A. 06/24/2020 12:45  PM Medical Record Number: [id number] Patient Account Number: [id number] Date of Birth/Sex: Treating RN: 10/20/1950 (69 y.o. [name] Primary Care Laurice Kimmons: [name] Other Clinician: Referring Don Giarrusso: Treating Macklin Jacquin/Extender: Thayer Headings in Treatment: 5 Active Inactive Wound/Skin Impairment Nursing Diagnoses: Impaired tissue integrity Knowledge deficit related to ulceration/compromised skin integrity Goals: Patient/caregiver will verbalize understanding of skin care regimen Date Initiated: 05/20/2020 Target Resolution Date: 07/26/2020 Goal Status: Active Ulcer/skin breakdown will have a volume reduction of 30%  by week 4 Date Initiated: 05/20/2020 Date Inactivated: 06/24/2020 Target Resolution Date: 06/21/2020 Goal Status: Met Interventions: Assess patient/caregiver ability to obtain necessary supplies Assess patient/caregiver ability to perform ulcer/skin care regimen upon admission and as needed Assess ulceration(s) every visit Provide education on ulcer and skin care Notes: Electronic Signature(s) Signed: 06/24/2020 5:38:15 PM By: Levan Hurst RN, BSN Entered By: Levan Hurst on 06/24/2020 13:18:51 -------------------------------------------------------------------------------- Pain Assessment Details Patient Name: Date of Service: Alferd Patee A. 06/24/2020 12:45 PM Medical Record Number: [id number] Patient Account Number: [id number] Date of Birth/Sex: Treating RN: 09-05-1950 (69 y.o. [name] Primary Care Arek Spadafore: [name] Other Clinician: Referring Wynnie Pacetti: Treating Jafar Poffenberger/Extender: Thayer Headings in Treatment: 5 Active Problems Location of Pain Severity and Description of Pain Patient Has Paino No Site Locations Pain Management and Medication Current Pain Management: Electronic Signature(s) Signed: 06/24/2020 5:38:15 PM By: Levan Hurst RN,  BSN Signed: 06/25/2020 2:20:57 PM By: Sandre Kitty Entered By: Sandre Kitty on 06/24/2020 12:52:37 -------------------------------------------------------------------------------- Patient/Caregiver Education Details Patient Name: Date of Service: Saverio Danker 12/20/2021andnbsp12:45 PM Medical Record Number: [id number] Patient Account Number: [id number] Date of Birth/Gender: Treating RN: 1950/07/24 (69 y.o. [name] Primary Care Physician: [name] Other Clinician: Referring Physician: Treating Physician/Extender: Thayer Headings in Treatment: 5 Education Assessment Education Provided To: Patient Education Topics Provided Wound/Skin Impairment: Methods: Explain/Verbal Responses: State content correctly Electronic Signature(s) Signed: 06/24/2020 5:38:15 PM By: Levan Hurst RN, BSN Entered By: Levan Hurst on 06/24/2020 13:06:34 -------------------------------------------------------------------------------- Wound Assessment Details Patient Name: Date of Service: Alferd Patee A. 06/24/2020 12:45 PM Medical Record Number: [id number] Patient Account Number: [id number] Date of Birth/Sex: Treating RN: July 12, 1950 (69 y.o. [name] Primary Care Codi Kertz: [name] Other Clinician: Referring Zelma Mazariego: Treating Konica Stankowski/Extender: Thayer Headings in Treatment: 5 Wound Status Wound Number: 1 Primary Etiology: Abscess Wound Location: Pubis Wound Status: Healed - Epithelialized Wounding Event: Gradually Appeared Date Acquired: 02/04/2020 Weeks Of Treatment: 5 Clustered Wound: No Photos Photo Uploaded By: Mikeal Hawthorne on 06/25/2020 13:10:24 Wound Measurements Length: (cm) Width: (cm) Depth: (cm) Area: (cm) Volume: (cm) 0 % Reduction in Area: 100% 0 % Reduction in Volume: 100% 0 0 0 Wound Description Classification: Full Thickness Without Exposed  Support Structur es Electronic Signature(s) Signed: 06/24/2020 5:23:06 PM By: Deon Pilling Entered By: Deon Pilling on 06/24/2020 12:58:03 -------------------------------------------------------------------------------- Vitals Details Patient Name: Date of Service: Alferd Patee A. 06/24/2020 12:45 PM Medical Record Number: [id number] Patient Account Number: [id number] Date of Birth/Sex: Treating RN: 1951/05/17 (69 y.o. [name] Primary Care Kaelen Brennan: [name] Other Clinician: Referring Chemere Steffler: Treating Sarh Kirschenbaum/Extender: Thayer Headings in Treatment: 5 Vital Signs Time Taken: 12:52 Temperature (F): 98.0 Height (in): 62 Pulse (bpm): 83 Weight (lbs): 172 Respiratory Rate (breaths/min): 18 Body Mass Index (BMI): 31.5 Blood Pressure (mmHg): 150/82 Reference Range: 80 - 120 mg / dl Electronic Signature(s) Signed: 06/25/2020 2:20:57 PM By: Sandre Kitty Entered By: Sandre Kitty on 06/24/2020 12:52:29

## 2020-06-27 DIAGNOSIS — L03314 Cellulitis of groin: Secondary | ICD-10-CM | POA: Diagnosis not present

## 2020-06-27 DIAGNOSIS — I2510 Atherosclerotic heart disease of native coronary artery without angina pectoris: Secondary | ICD-10-CM | POA: Diagnosis not present

## 2020-06-27 DIAGNOSIS — Z7982 Long term (current) use of aspirin: Secondary | ICD-10-CM | POA: Diagnosis not present

## 2020-06-27 DIAGNOSIS — E1122 Type 2 diabetes mellitus with diabetic chronic kidney disease: Secondary | ICD-10-CM | POA: Diagnosis not present

## 2020-06-27 DIAGNOSIS — I130 Hypertensive heart and chronic kidney disease with heart failure and stage 1 through stage 4 chronic kidney disease, or unspecified chronic kidney disease: Secondary | ICD-10-CM | POA: Diagnosis not present

## 2020-06-27 DIAGNOSIS — I255 Ischemic cardiomyopathy: Secondary | ICD-10-CM | POA: Diagnosis not present

## 2020-06-27 DIAGNOSIS — B952 Enterococcus as the cause of diseases classified elsewhere: Secondary | ICD-10-CM | POA: Diagnosis not present

## 2020-06-27 DIAGNOSIS — J449 Chronic obstructive pulmonary disease, unspecified: Secondary | ICD-10-CM | POA: Diagnosis not present

## 2020-06-27 DIAGNOSIS — Z8673 Personal history of transient ischemic attack (TIA), and cerebral infarction without residual deficits: Secondary | ICD-10-CM | POA: Diagnosis not present

## 2020-06-27 DIAGNOSIS — N390 Urinary tract infection, site not specified: Secondary | ICD-10-CM | POA: Diagnosis not present

## 2020-06-27 DIAGNOSIS — F17210 Nicotine dependence, cigarettes, uncomplicated: Secondary | ICD-10-CM | POA: Diagnosis not present

## 2020-06-27 DIAGNOSIS — Z85038 Personal history of other malignant neoplasm of large intestine: Secondary | ICD-10-CM | POA: Diagnosis not present

## 2020-06-27 DIAGNOSIS — D631 Anemia in chronic kidney disease: Secondary | ICD-10-CM | POA: Diagnosis not present

## 2020-06-27 DIAGNOSIS — I252 Old myocardial infarction: Secondary | ICD-10-CM | POA: Diagnosis not present

## 2020-06-27 DIAGNOSIS — E785 Hyperlipidemia, unspecified: Secondary | ICD-10-CM | POA: Diagnosis not present

## 2020-06-27 DIAGNOSIS — N184 Chronic kidney disease, stage 4 (severe): Secondary | ICD-10-CM | POA: Diagnosis not present

## 2020-06-27 DIAGNOSIS — D509 Iron deficiency anemia, unspecified: Secondary | ICD-10-CM | POA: Diagnosis not present

## 2020-06-27 DIAGNOSIS — I5040 Unspecified combined systolic (congestive) and diastolic (congestive) heart failure: Secondary | ICD-10-CM | POA: Diagnosis not present

## 2020-06-27 DIAGNOSIS — L02214 Cutaneous abscess of groin: Secondary | ICD-10-CM | POA: Diagnosis not present

## 2020-06-27 DIAGNOSIS — K219 Gastro-esophageal reflux disease without esophagitis: Secondary | ICD-10-CM | POA: Diagnosis not present

## 2020-07-01 DIAGNOSIS — K219 Gastro-esophageal reflux disease without esophagitis: Secondary | ICD-10-CM | POA: Diagnosis not present

## 2020-07-01 DIAGNOSIS — I252 Old myocardial infarction: Secondary | ICD-10-CM | POA: Diagnosis not present

## 2020-07-01 DIAGNOSIS — E1122 Type 2 diabetes mellitus with diabetic chronic kidney disease: Secondary | ICD-10-CM | POA: Diagnosis not present

## 2020-07-01 DIAGNOSIS — I255 Ischemic cardiomyopathy: Secondary | ICD-10-CM | POA: Diagnosis not present

## 2020-07-01 DIAGNOSIS — Z8673 Personal history of transient ischemic attack (TIA), and cerebral infarction without residual deficits: Secondary | ICD-10-CM | POA: Diagnosis not present

## 2020-07-01 DIAGNOSIS — I2510 Atherosclerotic heart disease of native coronary artery without angina pectoris: Secondary | ICD-10-CM | POA: Diagnosis not present

## 2020-07-01 DIAGNOSIS — J449 Chronic obstructive pulmonary disease, unspecified: Secondary | ICD-10-CM | POA: Diagnosis not present

## 2020-07-01 DIAGNOSIS — F17210 Nicotine dependence, cigarettes, uncomplicated: Secondary | ICD-10-CM | POA: Diagnosis not present

## 2020-07-01 DIAGNOSIS — I5040 Unspecified combined systolic (congestive) and diastolic (congestive) heart failure: Secondary | ICD-10-CM | POA: Diagnosis not present

## 2020-07-01 DIAGNOSIS — L03314 Cellulitis of groin: Secondary | ICD-10-CM | POA: Diagnosis not present

## 2020-07-01 DIAGNOSIS — N184 Chronic kidney disease, stage 4 (severe): Secondary | ICD-10-CM | POA: Diagnosis not present

## 2020-07-01 DIAGNOSIS — N390 Urinary tract infection, site not specified: Secondary | ICD-10-CM | POA: Diagnosis not present

## 2020-07-01 DIAGNOSIS — Z85038 Personal history of other malignant neoplasm of large intestine: Secondary | ICD-10-CM | POA: Diagnosis not present

## 2020-07-01 DIAGNOSIS — B952 Enterococcus as the cause of diseases classified elsewhere: Secondary | ICD-10-CM | POA: Diagnosis not present

## 2020-07-01 DIAGNOSIS — E785 Hyperlipidemia, unspecified: Secondary | ICD-10-CM | POA: Diagnosis not present

## 2020-07-01 DIAGNOSIS — L02214 Cutaneous abscess of groin: Secondary | ICD-10-CM | POA: Diagnosis not present

## 2020-07-01 DIAGNOSIS — D509 Iron deficiency anemia, unspecified: Secondary | ICD-10-CM | POA: Diagnosis not present

## 2020-07-01 DIAGNOSIS — D631 Anemia in chronic kidney disease: Secondary | ICD-10-CM | POA: Diagnosis not present

## 2020-07-01 DIAGNOSIS — I130 Hypertensive heart and chronic kidney disease with heart failure and stage 1 through stage 4 chronic kidney disease, or unspecified chronic kidney disease: Secondary | ICD-10-CM | POA: Diagnosis not present

## 2020-07-01 DIAGNOSIS — Z7982 Long term (current) use of aspirin: Secondary | ICD-10-CM | POA: Diagnosis not present

## 2020-07-03 DIAGNOSIS — K219 Gastro-esophageal reflux disease without esophagitis: Secondary | ICD-10-CM | POA: Diagnosis not present

## 2020-07-03 DIAGNOSIS — J449 Chronic obstructive pulmonary disease, unspecified: Secondary | ICD-10-CM | POA: Diagnosis not present

## 2020-07-03 DIAGNOSIS — I252 Old myocardial infarction: Secondary | ICD-10-CM | POA: Diagnosis not present

## 2020-07-03 DIAGNOSIS — N184 Chronic kidney disease, stage 4 (severe): Secondary | ICD-10-CM | POA: Diagnosis not present

## 2020-07-03 DIAGNOSIS — Z8673 Personal history of transient ischemic attack (TIA), and cerebral infarction without residual deficits: Secondary | ICD-10-CM | POA: Diagnosis not present

## 2020-07-03 DIAGNOSIS — Z7982 Long term (current) use of aspirin: Secondary | ICD-10-CM | POA: Diagnosis not present

## 2020-07-03 DIAGNOSIS — N390 Urinary tract infection, site not specified: Secondary | ICD-10-CM | POA: Diagnosis not present

## 2020-07-03 DIAGNOSIS — I2510 Atherosclerotic heart disease of native coronary artery without angina pectoris: Secondary | ICD-10-CM | POA: Diagnosis not present

## 2020-07-03 DIAGNOSIS — E785 Hyperlipidemia, unspecified: Secondary | ICD-10-CM | POA: Diagnosis not present

## 2020-07-03 DIAGNOSIS — L02214 Cutaneous abscess of groin: Secondary | ICD-10-CM | POA: Diagnosis not present

## 2020-07-03 DIAGNOSIS — F17210 Nicotine dependence, cigarettes, uncomplicated: Secondary | ICD-10-CM | POA: Diagnosis not present

## 2020-07-03 DIAGNOSIS — Z85038 Personal history of other malignant neoplasm of large intestine: Secondary | ICD-10-CM | POA: Diagnosis not present

## 2020-07-03 DIAGNOSIS — E1122 Type 2 diabetes mellitus with diabetic chronic kidney disease: Secondary | ICD-10-CM | POA: Diagnosis not present

## 2020-07-03 DIAGNOSIS — I5040 Unspecified combined systolic (congestive) and diastolic (congestive) heart failure: Secondary | ICD-10-CM | POA: Diagnosis not present

## 2020-07-03 DIAGNOSIS — L03314 Cellulitis of groin: Secondary | ICD-10-CM | POA: Diagnosis not present

## 2020-07-03 DIAGNOSIS — I130 Hypertensive heart and chronic kidney disease with heart failure and stage 1 through stage 4 chronic kidney disease, or unspecified chronic kidney disease: Secondary | ICD-10-CM | POA: Diagnosis not present

## 2020-07-03 DIAGNOSIS — D509 Iron deficiency anemia, unspecified: Secondary | ICD-10-CM | POA: Diagnosis not present

## 2020-07-03 DIAGNOSIS — B952 Enterococcus as the cause of diseases classified elsewhere: Secondary | ICD-10-CM | POA: Diagnosis not present

## 2020-07-03 DIAGNOSIS — D631 Anemia in chronic kidney disease: Secondary | ICD-10-CM | POA: Diagnosis not present

## 2020-07-03 DIAGNOSIS — I255 Ischemic cardiomyopathy: Secondary | ICD-10-CM | POA: Diagnosis not present

## 2020-07-08 ENCOUNTER — Encounter (HOSPITAL_BASED_OUTPATIENT_CLINIC_OR_DEPARTMENT_OTHER): Payer: Medicare Other | Admitting: Internal Medicine

## 2020-07-08 DIAGNOSIS — Z85038 Personal history of other malignant neoplasm of large intestine: Secondary | ICD-10-CM

## 2020-07-08 DIAGNOSIS — Z8673 Personal history of transient ischemic attack (TIA), and cerebral infarction without residual deficits: Secondary | ICD-10-CM

## 2020-07-08 DIAGNOSIS — K219 Gastro-esophageal reflux disease without esophagitis: Secondary | ICD-10-CM

## 2020-07-08 DIAGNOSIS — Z9181 History of falling: Secondary | ICD-10-CM

## 2020-07-08 DIAGNOSIS — I130 Hypertensive heart and chronic kidney disease with heart failure and stage 1 through stage 4 chronic kidney disease, or unspecified chronic kidney disease: Secondary | ICD-10-CM | POA: Diagnosis not present

## 2020-07-08 DIAGNOSIS — N8500 Endometrial hyperplasia, unspecified: Secondary | ICD-10-CM

## 2020-07-08 DIAGNOSIS — I252 Old myocardial infarction: Secondary | ICD-10-CM

## 2020-07-08 DIAGNOSIS — L02214 Cutaneous abscess of groin: Secondary | ICD-10-CM | POA: Diagnosis not present

## 2020-07-08 DIAGNOSIS — N184 Chronic kidney disease, stage 4 (severe): Secondary | ICD-10-CM | POA: Diagnosis not present

## 2020-07-08 DIAGNOSIS — N390 Urinary tract infection, site not specified: Secondary | ICD-10-CM | POA: Diagnosis not present

## 2020-07-08 DIAGNOSIS — L03314 Cellulitis of groin: Secondary | ICD-10-CM | POA: Diagnosis not present

## 2020-07-08 DIAGNOSIS — B952 Enterococcus as the cause of diseases classified elsewhere: Secondary | ICD-10-CM | POA: Diagnosis not present

## 2020-07-08 DIAGNOSIS — D631 Anemia in chronic kidney disease: Secondary | ICD-10-CM | POA: Diagnosis not present

## 2020-07-08 DIAGNOSIS — J449 Chronic obstructive pulmonary disease, unspecified: Secondary | ICD-10-CM | POA: Diagnosis not present

## 2020-07-08 DIAGNOSIS — E785 Hyperlipidemia, unspecified: Secondary | ICD-10-CM | POA: Diagnosis not present

## 2020-07-08 DIAGNOSIS — Z7982 Long term (current) use of aspirin: Secondary | ICD-10-CM

## 2020-07-08 DIAGNOSIS — L02215 Cutaneous abscess of perineum: Secondary | ICD-10-CM

## 2020-07-08 DIAGNOSIS — E1122 Type 2 diabetes mellitus with diabetic chronic kidney disease: Secondary | ICD-10-CM | POA: Diagnosis not present

## 2020-07-08 DIAGNOSIS — I2510 Atherosclerotic heart disease of native coronary artery without angina pectoris: Secondary | ICD-10-CM

## 2020-07-08 DIAGNOSIS — D509 Iron deficiency anemia, unspecified: Secondary | ICD-10-CM

## 2020-07-08 DIAGNOSIS — I255 Ischemic cardiomyopathy: Secondary | ICD-10-CM

## 2020-07-08 DIAGNOSIS — I5040 Unspecified combined systolic (congestive) and diastolic (congestive) heart failure: Secondary | ICD-10-CM | POA: Diagnosis not present

## 2020-07-08 DIAGNOSIS — Z6833 Body mass index (BMI) 33.0-33.9, adult: Secondary | ICD-10-CM

## 2020-07-08 DIAGNOSIS — F17210 Nicotine dependence, cigarettes, uncomplicated: Secondary | ICD-10-CM

## 2020-07-09 DIAGNOSIS — I5040 Unspecified combined systolic (congestive) and diastolic (congestive) heart failure: Secondary | ICD-10-CM | POA: Diagnosis not present

## 2020-07-09 DIAGNOSIS — E785 Hyperlipidemia, unspecified: Secondary | ICD-10-CM | POA: Diagnosis not present

## 2020-07-09 DIAGNOSIS — I255 Ischemic cardiomyopathy: Secondary | ICD-10-CM | POA: Diagnosis not present

## 2020-07-09 DIAGNOSIS — K219 Gastro-esophageal reflux disease without esophagitis: Secondary | ICD-10-CM | POA: Diagnosis not present

## 2020-07-09 DIAGNOSIS — I252 Old myocardial infarction: Secondary | ICD-10-CM | POA: Diagnosis not present

## 2020-07-09 DIAGNOSIS — L02214 Cutaneous abscess of groin: Secondary | ICD-10-CM | POA: Diagnosis not present

## 2020-07-09 DIAGNOSIS — B952 Enterococcus as the cause of diseases classified elsewhere: Secondary | ICD-10-CM | POA: Diagnosis not present

## 2020-07-09 DIAGNOSIS — F17210 Nicotine dependence, cigarettes, uncomplicated: Secondary | ICD-10-CM | POA: Diagnosis not present

## 2020-07-09 DIAGNOSIS — D631 Anemia in chronic kidney disease: Secondary | ICD-10-CM | POA: Diagnosis not present

## 2020-07-09 DIAGNOSIS — N390 Urinary tract infection, site not specified: Secondary | ICD-10-CM | POA: Diagnosis not present

## 2020-07-09 DIAGNOSIS — E1122 Type 2 diabetes mellitus with diabetic chronic kidney disease: Secondary | ICD-10-CM | POA: Diagnosis not present

## 2020-07-09 DIAGNOSIS — I130 Hypertensive heart and chronic kidney disease with heart failure and stage 1 through stage 4 chronic kidney disease, or unspecified chronic kidney disease: Secondary | ICD-10-CM | POA: Diagnosis not present

## 2020-07-09 DIAGNOSIS — Z85038 Personal history of other malignant neoplasm of large intestine: Secondary | ICD-10-CM | POA: Diagnosis not present

## 2020-07-09 DIAGNOSIS — Z7982 Long term (current) use of aspirin: Secondary | ICD-10-CM | POA: Diagnosis not present

## 2020-07-09 DIAGNOSIS — D509 Iron deficiency anemia, unspecified: Secondary | ICD-10-CM | POA: Diagnosis not present

## 2020-07-09 DIAGNOSIS — Z8673 Personal history of transient ischemic attack (TIA), and cerebral infarction without residual deficits: Secondary | ICD-10-CM | POA: Diagnosis not present

## 2020-07-09 DIAGNOSIS — N184 Chronic kidney disease, stage 4 (severe): Secondary | ICD-10-CM | POA: Diagnosis not present

## 2020-07-09 DIAGNOSIS — L03314 Cellulitis of groin: Secondary | ICD-10-CM | POA: Diagnosis not present

## 2020-07-09 DIAGNOSIS — I2510 Atherosclerotic heart disease of native coronary artery without angina pectoris: Secondary | ICD-10-CM | POA: Diagnosis not present

## 2020-07-09 DIAGNOSIS — J449 Chronic obstructive pulmonary disease, unspecified: Secondary | ICD-10-CM | POA: Diagnosis not present

## 2020-07-10 ENCOUNTER — Other Ambulatory Visit: Payer: Self-pay

## 2020-07-11 ENCOUNTER — Ambulatory Visit (INDEPENDENT_AMBULATORY_CARE_PROVIDER_SITE_OTHER): Payer: Medicare Other | Admitting: Internal Medicine

## 2020-07-11 ENCOUNTER — Encounter: Payer: Self-pay | Admitting: Internal Medicine

## 2020-07-11 DIAGNOSIS — I2510 Atherosclerotic heart disease of native coronary artery without angina pectoris: Secondary | ICD-10-CM | POA: Diagnosis not present

## 2020-07-11 DIAGNOSIS — I252 Old myocardial infarction: Secondary | ICD-10-CM | POA: Diagnosis not present

## 2020-07-11 DIAGNOSIS — D631 Anemia in chronic kidney disease: Secondary | ICD-10-CM | POA: Diagnosis not present

## 2020-07-11 DIAGNOSIS — I255 Ischemic cardiomyopathy: Secondary | ICD-10-CM | POA: Diagnosis not present

## 2020-07-11 DIAGNOSIS — I5042 Chronic combined systolic (congestive) and diastolic (congestive) heart failure: Secondary | ICD-10-CM

## 2020-07-11 DIAGNOSIS — I130 Hypertensive heart and chronic kidney disease with heart failure and stage 1 through stage 4 chronic kidney disease, or unspecified chronic kidney disease: Secondary | ICD-10-CM | POA: Diagnosis not present

## 2020-07-11 DIAGNOSIS — K219 Gastro-esophageal reflux disease without esophagitis: Secondary | ICD-10-CM | POA: Diagnosis not present

## 2020-07-11 DIAGNOSIS — D509 Iron deficiency anemia, unspecified: Secondary | ICD-10-CM | POA: Diagnosis not present

## 2020-07-11 DIAGNOSIS — E1159 Type 2 diabetes mellitus with other circulatory complications: Secondary | ICD-10-CM

## 2020-07-11 DIAGNOSIS — E785 Hyperlipidemia, unspecified: Secondary | ICD-10-CM | POA: Diagnosis not present

## 2020-07-11 DIAGNOSIS — Z85038 Personal history of other malignant neoplasm of large intestine: Secondary | ICD-10-CM | POA: Diagnosis not present

## 2020-07-11 DIAGNOSIS — B952 Enterococcus as the cause of diseases classified elsewhere: Secondary | ICD-10-CM | POA: Diagnosis not present

## 2020-07-11 DIAGNOSIS — I152 Hypertension secondary to endocrine disorders: Secondary | ICD-10-CM | POA: Diagnosis not present

## 2020-07-11 DIAGNOSIS — N184 Chronic kidney disease, stage 4 (severe): Secondary | ICD-10-CM

## 2020-07-11 DIAGNOSIS — E1122 Type 2 diabetes mellitus with diabetic chronic kidney disease: Secondary | ICD-10-CM | POA: Diagnosis not present

## 2020-07-11 DIAGNOSIS — L03314 Cellulitis of groin: Secondary | ICD-10-CM | POA: Diagnosis not present

## 2020-07-11 DIAGNOSIS — L02214 Cutaneous abscess of groin: Secondary | ICD-10-CM | POA: Diagnosis not present

## 2020-07-11 DIAGNOSIS — F17210 Nicotine dependence, cigarettes, uncomplicated: Secondary | ICD-10-CM | POA: Diagnosis not present

## 2020-07-11 DIAGNOSIS — Z8673 Personal history of transient ischemic attack (TIA), and cerebral infarction without residual deficits: Secondary | ICD-10-CM | POA: Diagnosis not present

## 2020-07-11 DIAGNOSIS — N390 Urinary tract infection, site not specified: Secondary | ICD-10-CM | POA: Diagnosis not present

## 2020-07-11 DIAGNOSIS — I5040 Unspecified combined systolic (congestive) and diastolic (congestive) heart failure: Secondary | ICD-10-CM | POA: Diagnosis not present

## 2020-07-11 DIAGNOSIS — J449 Chronic obstructive pulmonary disease, unspecified: Secondary | ICD-10-CM | POA: Diagnosis not present

## 2020-07-11 DIAGNOSIS — Z7982 Long term (current) use of aspirin: Secondary | ICD-10-CM | POA: Diagnosis not present

## 2020-07-11 NOTE — Assessment & Plan Note (Addendum)
Currently compensated.  Continue with amlodipine, torsemide

## 2020-07-11 NOTE — Assessment & Plan Note (Signed)
Crestor.  No angina

## 2020-07-11 NOTE — Assessment & Plan Note (Addendum)
Continue with amlodipine, torsemide -blood pressure is well controlled

## 2020-07-11 NOTE — Assessment & Plan Note (Signed)
Monitoring labs - BMET.  Good hydration, diabetes and blood pressure control

## 2020-07-11 NOTE — Progress Notes (Signed)
Subjective:  Patient ID: [name], female    DOB: 25-Jan-1951  Age: 70 y.o. MRN: [id number]  CC: Follow-up (3 month f/u)   HPI Amneet Cendejas presents for R abd wall abscess - seeing Dr Dellia Nims at the [facility] and Dr Earleen Newport - IR. Records reviewed  Pt stopped Levaquin - rash  Per chart:  "04/19/2020 ADMISSION this is a patient who became acutely ill with sepsis in late March. She required admission to hospital from 03/05/2020 through 9//9/21 with abscesses in her groin. She was septic with Enterococcus faecalis she is treated initially with Vanco and Zosyn but discharged on amoxicillin. She required surgical debridement of 3 areas including 1 in the anterior thigh and 2 on the right buttock. She was discharged to [facility]. 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 She was seen by plastic surgery including Dr. Heber  and then by Phoebe Sharps one of their PAs twice. At that point they described the areas on the right inner groin and then 3 new abdominal wounds. The last culture I can see done by plastic surgery on 11/3 showed both Klebsiella and Enterobacter. She was on Bactrim for a. And more recently is now on Amoxil presumably for a Streptococcus species on 11/2. She is still taking Amoxil. She has not been feeling systemically unwell no fever no nausea she is back at home. She has home health putting calcium alginate on the wounds. I have included the original CT scan report before her surgery. I am not completely certain that the extent of this was appreciated. This may have accounted for why she developed recurrent areas after discharge from our hospital on her lower abdomen which is clearly commented on in the CT scan report. I am not sure I could paraphrase this any better"   Outpatient Medications Prior to Visit  Medication Sig Dispense Refill  . amLODipine (NORVASC)  10 MG tablet TAKE ONE-HALF TABLET BY  MOUTH DAILY (Patient taking differently: Take 5 mg by mouth daily.) 45 tablet 3  . aspirin 81 MG chewable tablet Chew 1 tablet (81 mg total) by mouth daily.    . calcium carbonate (OS-CAL) 600 MG TABS tablet Take 600 mg by mouth 2 (two) times daily with a meal.    . Cholecalciferol (VITAMIN D3) 50 MCG (2000 UT) capsule Take 1 capsule (2,000 Units total) by mouth daily. 100 capsule 3  . HYDROcodone-acetaminophen (NORCO) 7.5-325 MG tablet Take 0.5-1 tablets by mouth every 6 (six) hours as needed for severe pain. 20 tablet 0  . hydrOXYzine (ATARAX/VISTARIL) 25 MG tablet Take 1-2 tablets (25-50 mg total) by mouth every 8 (eight) hours as needed for itching. 60 tablet 0  . lansoprazole (PREVACID) 15 MG capsule Take 1 capsule (15 mg total) by mouth daily at 12 noon. (Patient taking differently: Take 15 mg by mouth daily as needed (acid reflux).) 90 capsule 3  . ondansetron (ZOFRAN) 4 MG tablet Take 1 tablet (4 mg total) by mouth every 8 (eight) hours as needed for nausea or vomiting. 20 tablet 0  . ONETOUCH ULTRA test strip USE TO TEST BLOOD SUGAR TWO TIMES A DAY AS DIRECTED 200 strip 3  . potassium chloride SA (KLOR-CON) 20 MEQ tablet Take 1 tablet (20 mEq total) by mouth daily as needed (cramping).    . rosuvastatin (CRESTOR) 5 MG tablet TAKE 1 TABLET BY MOUTH  DAILY AT 6PM (  Patient taking differently: Take 5 mg by mouth daily.) 90 tablet 3  . torsemide (DEMADEX) 20 MG tablet Take 1 tablet (20 mg total) by mouth daily.    . vitamin B-12 (CYANOCOBALAMIN) 1000 MCG tablet Take 1,000 mcg by mouth daily.     No facility-administered medications prior to visit.    ROS: Review of Systems  Constitutional: Positive for unexpected weight change. Negative for activity change, appetite change, chills, fatigue and fever.  HENT: Negative for congestion, mouth sores and sinus pressure.   Eyes: Negative for visual disturbance.  Respiratory: Negative for cough and chest  tightness.   Gastrointestinal: Negative for abdominal pain and nausea.  Genitourinary: Negative for difficulty urinating, frequency and vaginal pain.  Musculoskeletal: Positive for arthralgias, back pain and gait problem.  Skin: Positive for wound. Negative for color change, pallor and rash.  Neurological: Negative for dizziness, tremors, weakness, numbness and headaches.  Psychiatric/Behavioral: Negative for confusion and sleep disturbance.    Objective:  BP 130/70 (BP Location: Left Arm)   Pulse 82   Temp 98.7 F (37.1 C) (Oral)   Wt 184 lb 3.2 oz (83.6 kg)   LMP  (LMP Unknown)   SpO2 99%   BMI 33.69 kg/m   BP Readings from Last 3 Encounters:  07/11/20 130/70  06/17/20 118/70  05/16/20 131/62    Wt Readings from Last 3 Encounters:  07/11/20 184 lb 3.2 oz (83.6 kg)  05/16/20 179 lb 3.2 oz (81.3 kg)  04/15/20 183 lb 12.8 oz (83.4 kg)    Physical Exam Constitutional:      General: She is not in acute distress.    Appearance: She is well-developed. She is obese.  HENT:     Head: Normocephalic.     Right Ear: External ear normal.     Left Ear: External ear normal.     Nose: Nose normal.     Mouth/Throat:     Mouth: Oropharynx is clear and moist.  Eyes:     General:        Right eye: No discharge.        Left eye: No discharge.     Conjunctiva/sclera: Conjunctivae normal.     Pupils: Pupils are equal, round, and reactive to light.  Neck:     Thyroid: No thyromegaly.     Vascular: No JVD.     Trachea: No tracheal deviation.  Cardiovascular:     Rate and Rhythm: Normal rate and regular rhythm.     Heart sounds: Normal heart sounds.  Pulmonary:     Effort: No respiratory distress.     Breath sounds: No stridor. No wheezing.  Abdominal:     General: Bowel sounds are normal. There is no distension.     Palpations: Abdomen is soft. There is no mass.     Tenderness: There is no abdominal tenderness. There is no guarding or rebound.  Musculoskeletal:         General: No tenderness or edema.     Cervical back: Normal range of motion and neck supple.  Lymphadenopathy:     Cervical: No cervical adenopathy.  Skin:    Findings: No erythema or rash.  Neurological:     Mental Status: She is oriented to person, place, and time.     Cranial Nerves: No cranial nerve deficit.     Motor: No abnormal muscle tone.     Coordination: Coordination normal.     Gait: Gait abnormal.     Deep Tendon Reflexes: Reflexes  normal.  Psychiatric:        Mood and Affect: Mood and affect normal.        Behavior: Behavior normal.        Thought Content: Thought content normal.        Judgment: Judgment normal.     RLQ drain/turbid white d/c  Lab Results  Component Value Date   WBC 13.3 (H) 03/13/2020   HGB 8.7 (L) 03/13/2020   HCT 27.8 (L) 03/13/2020   PLT 242 03/13/2020   GLUCOSE 192 (H) 03/13/2020   CHOL 119 01/10/2020   TRIG 73.0 01/10/2020   HDL 48.50 01/10/2020   LDLCALC 56 01/10/2020   ALT 12 01/10/2019   AST 12 01/10/2019   NA 137 03/13/2020   K 3.6 03/13/2020   CL 106 03/13/2020   CREATININE 1.54 (H) 03/13/2020   BUN 25 (H) 03/13/2020   CO2 21 (L) 03/13/2020   TSH 2.17 01/10/2020   INR 1.1 06/17/2020   HGBA1C 8.3 (A) 05/16/2020   MICROALBUR 13.9 (H) 10/13/2007    CT IMAGE GUIDED DRAINAGE PERCUT CATH  PERITONEAL RETROPERIT  Result Date: 06/17/2020 INDICATION: 70 year old female with a 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 COMPLICATIONS: None PROCEDURE: Informed written consent was obtained from the patient after a thorough discussion of the procedural risks, benefits and alternatives. All questions were addressed. Maximal Sterile  Barrier Technique was utilized including caps, mask, sterile gowns, sterile gloves, sterile drape, hand hygiene and skin antiseptic. A timeout was performed prior to the initiation of the procedure. Patient positioned supine position on CT gantry table. Scout CT of the pelvis performed for planning purposes. The patient is prepped and draped in the usual sterile fashion. 1% lidocaine was used for local anesthesia. Using CT guidance, tandem trocar needle and Yueh needle were advanced from a lateral position along the long axis of a lentiform fluid collection superficial to the abdominal wall musculature of the right lower quadrant/pelvis. Once we confirmed needle tip position. Modified Seldinger technique was used to place a 10 [place] drain into the fluid collection. Approximately 5 cc of murky thin fluid aspirated for a culture. Drain was formed in place and a final CT image was recorded. Drain was attached to bulb suction. Patient tolerated the procedure well and remained hemodynamically stable throughout. No complications were encountered and no significant blood loss. IMPRESSION: Status post CT-guided drain placement into a thin fluid collection of the low right anterior abdominal wall. Signed, Dulcy Fanny. Dellia Nims, RPVI [facility] Rad[facility] Electronically Signed   By: Corrie Mckusick D.O.   On: 06/17/2020 12:55    Assessment & Plan:    Walker Kehr, MD

## 2020-07-16 DIAGNOSIS — I130 Hypertensive heart and chronic kidney disease with heart failure and stage 1 through stage 4 chronic kidney disease, or unspecified chronic kidney disease: Secondary | ICD-10-CM | POA: Diagnosis not present

## 2020-07-16 DIAGNOSIS — I255 Ischemic cardiomyopathy: Secondary | ICD-10-CM | POA: Diagnosis not present

## 2020-07-16 DIAGNOSIS — D631 Anemia in chronic kidney disease: Secondary | ICD-10-CM | POA: Diagnosis not present

## 2020-07-16 DIAGNOSIS — N184 Chronic kidney disease, stage 4 (severe): Secondary | ICD-10-CM | POA: Diagnosis not present

## 2020-07-16 DIAGNOSIS — E785 Hyperlipidemia, unspecified: Secondary | ICD-10-CM | POA: Diagnosis not present

## 2020-07-16 DIAGNOSIS — D509 Iron deficiency anemia, unspecified: Secondary | ICD-10-CM | POA: Diagnosis not present

## 2020-07-16 DIAGNOSIS — L02214 Cutaneous abscess of groin: Secondary | ICD-10-CM | POA: Diagnosis not present

## 2020-07-16 DIAGNOSIS — Z7982 Long term (current) use of aspirin: Secondary | ICD-10-CM | POA: Diagnosis not present

## 2020-07-16 DIAGNOSIS — I5040 Unspecified combined systolic (congestive) and diastolic (congestive) heart failure: Secondary | ICD-10-CM | POA: Diagnosis not present

## 2020-07-16 DIAGNOSIS — B952 Enterococcus as the cause of diseases classified elsewhere: Secondary | ICD-10-CM | POA: Diagnosis not present

## 2020-07-16 DIAGNOSIS — I252 Old myocardial infarction: Secondary | ICD-10-CM | POA: Diagnosis not present

## 2020-07-16 DIAGNOSIS — Z85038 Personal history of other malignant neoplasm of large intestine: Secondary | ICD-10-CM | POA: Diagnosis not present

## 2020-07-16 DIAGNOSIS — Z8673 Personal history of transient ischemic attack (TIA), and cerebral infarction without residual deficits: Secondary | ICD-10-CM | POA: Diagnosis not present

## 2020-07-16 DIAGNOSIS — E1122 Type 2 diabetes mellitus with diabetic chronic kidney disease: Secondary | ICD-10-CM | POA: Diagnosis not present

## 2020-07-16 DIAGNOSIS — I2510 Atherosclerotic heart disease of native coronary artery without angina pectoris: Secondary | ICD-10-CM | POA: Diagnosis not present

## 2020-07-16 DIAGNOSIS — L03314 Cellulitis of groin: Secondary | ICD-10-CM | POA: Diagnosis not present

## 2020-07-16 DIAGNOSIS — F17210 Nicotine dependence, cigarettes, uncomplicated: Secondary | ICD-10-CM | POA: Diagnosis not present

## 2020-07-16 DIAGNOSIS — J449 Chronic obstructive pulmonary disease, unspecified: Secondary | ICD-10-CM | POA: Diagnosis not present

## 2020-07-16 DIAGNOSIS — K219 Gastro-esophageal reflux disease without esophagitis: Secondary | ICD-10-CM | POA: Diagnosis not present

## 2020-07-16 DIAGNOSIS — N390 Urinary tract infection, site not specified: Secondary | ICD-10-CM | POA: Diagnosis not present

## 2020-07-19 DIAGNOSIS — L03314 Cellulitis of groin: Secondary | ICD-10-CM | POA: Diagnosis not present

## 2020-07-19 DIAGNOSIS — F17210 Nicotine dependence, cigarettes, uncomplicated: Secondary | ICD-10-CM | POA: Diagnosis not present

## 2020-07-19 DIAGNOSIS — D509 Iron deficiency anemia, unspecified: Secondary | ICD-10-CM | POA: Diagnosis not present

## 2020-07-19 DIAGNOSIS — I130 Hypertensive heart and chronic kidney disease with heart failure and stage 1 through stage 4 chronic kidney disease, or unspecified chronic kidney disease: Secondary | ICD-10-CM | POA: Diagnosis not present

## 2020-07-19 DIAGNOSIS — N390 Urinary tract infection, site not specified: Secondary | ICD-10-CM | POA: Diagnosis not present

## 2020-07-19 DIAGNOSIS — I255 Ischemic cardiomyopathy: Secondary | ICD-10-CM | POA: Diagnosis not present

## 2020-07-19 DIAGNOSIS — E785 Hyperlipidemia, unspecified: Secondary | ICD-10-CM | POA: Diagnosis not present

## 2020-07-19 DIAGNOSIS — Z7982 Long term (current) use of aspirin: Secondary | ICD-10-CM | POA: Diagnosis not present

## 2020-07-19 DIAGNOSIS — E1122 Type 2 diabetes mellitus with diabetic chronic kidney disease: Secondary | ICD-10-CM | POA: Diagnosis not present

## 2020-07-19 DIAGNOSIS — I2510 Atherosclerotic heart disease of native coronary artery without angina pectoris: Secondary | ICD-10-CM | POA: Diagnosis not present

## 2020-07-19 DIAGNOSIS — N184 Chronic kidney disease, stage 4 (severe): Secondary | ICD-10-CM | POA: Diagnosis not present

## 2020-07-19 DIAGNOSIS — I252 Old myocardial infarction: Secondary | ICD-10-CM | POA: Diagnosis not present

## 2020-07-19 DIAGNOSIS — L02214 Cutaneous abscess of groin: Secondary | ICD-10-CM | POA: Diagnosis not present

## 2020-07-19 DIAGNOSIS — Z8673 Personal history of transient ischemic attack (TIA), and cerebral infarction without residual deficits: Secondary | ICD-10-CM | POA: Diagnosis not present

## 2020-07-19 DIAGNOSIS — Z85038 Personal history of other malignant neoplasm of large intestine: Secondary | ICD-10-CM | POA: Diagnosis not present

## 2020-07-19 DIAGNOSIS — B952 Enterococcus as the cause of diseases classified elsewhere: Secondary | ICD-10-CM | POA: Diagnosis not present

## 2020-07-19 DIAGNOSIS — J449 Chronic obstructive pulmonary disease, unspecified: Secondary | ICD-10-CM | POA: Diagnosis not present

## 2020-07-19 DIAGNOSIS — D631 Anemia in chronic kidney disease: Secondary | ICD-10-CM | POA: Diagnosis not present

## 2020-07-19 DIAGNOSIS — I5040 Unspecified combined systolic (congestive) and diastolic (congestive) heart failure: Secondary | ICD-10-CM | POA: Diagnosis not present

## 2020-07-19 DIAGNOSIS — K219 Gastro-esophageal reflux disease without esophagitis: Secondary | ICD-10-CM | POA: Diagnosis not present

## 2020-07-22 ENCOUNTER — Encounter (HOSPITAL_BASED_OUTPATIENT_CLINIC_OR_DEPARTMENT_OTHER): Payer: Medicare Other | Admitting: Internal Medicine

## 2020-07-25 DIAGNOSIS — Z7982 Long term (current) use of aspirin: Secondary | ICD-10-CM | POA: Diagnosis not present

## 2020-07-25 DIAGNOSIS — Z8673 Personal history of transient ischemic attack (TIA), and cerebral infarction without residual deficits: Secondary | ICD-10-CM | POA: Diagnosis not present

## 2020-07-25 DIAGNOSIS — I255 Ischemic cardiomyopathy: Secondary | ICD-10-CM | POA: Diagnosis not present

## 2020-07-25 DIAGNOSIS — B952 Enterococcus as the cause of diseases classified elsewhere: Secondary | ICD-10-CM | POA: Diagnosis not present

## 2020-07-25 DIAGNOSIS — N390 Urinary tract infection, site not specified: Secondary | ICD-10-CM | POA: Diagnosis not present

## 2020-07-25 DIAGNOSIS — N184 Chronic kidney disease, stage 4 (severe): Secondary | ICD-10-CM | POA: Diagnosis not present

## 2020-07-25 DIAGNOSIS — I130 Hypertensive heart and chronic kidney disease with heart failure and stage 1 through stage 4 chronic kidney disease, or unspecified chronic kidney disease: Secondary | ICD-10-CM | POA: Diagnosis not present

## 2020-07-25 DIAGNOSIS — I252 Old myocardial infarction: Secondary | ICD-10-CM | POA: Diagnosis not present

## 2020-07-25 DIAGNOSIS — F17210 Nicotine dependence, cigarettes, uncomplicated: Secondary | ICD-10-CM | POA: Diagnosis not present

## 2020-07-25 DIAGNOSIS — J449 Chronic obstructive pulmonary disease, unspecified: Secondary | ICD-10-CM | POA: Diagnosis not present

## 2020-07-25 DIAGNOSIS — L02214 Cutaneous abscess of groin: Secondary | ICD-10-CM | POA: Diagnosis not present

## 2020-07-25 DIAGNOSIS — I2510 Atherosclerotic heart disease of native coronary artery without angina pectoris: Secondary | ICD-10-CM | POA: Diagnosis not present

## 2020-07-25 DIAGNOSIS — D631 Anemia in chronic kidney disease: Secondary | ICD-10-CM | POA: Diagnosis not present

## 2020-07-25 DIAGNOSIS — Z85038 Personal history of other malignant neoplasm of large intestine: Secondary | ICD-10-CM | POA: Diagnosis not present

## 2020-07-25 DIAGNOSIS — E1122 Type 2 diabetes mellitus with diabetic chronic kidney disease: Secondary | ICD-10-CM | POA: Diagnosis not present

## 2020-07-25 DIAGNOSIS — L03314 Cellulitis of groin: Secondary | ICD-10-CM | POA: Diagnosis not present

## 2020-07-25 DIAGNOSIS — E785 Hyperlipidemia, unspecified: Secondary | ICD-10-CM | POA: Diagnosis not present

## 2020-07-25 DIAGNOSIS — I5040 Unspecified combined systolic (congestive) and diastolic (congestive) heart failure: Secondary | ICD-10-CM | POA: Diagnosis not present

## 2020-07-25 DIAGNOSIS — D509 Iron deficiency anemia, unspecified: Secondary | ICD-10-CM | POA: Diagnosis not present

## 2020-07-25 DIAGNOSIS — K219 Gastro-esophageal reflux disease without esophagitis: Secondary | ICD-10-CM | POA: Diagnosis not present

## 2020-07-29 ENCOUNTER — Other Ambulatory Visit: Payer: Self-pay | Admitting: Internal Medicine

## 2020-07-29 DIAGNOSIS — I2510 Atherosclerotic heart disease of native coronary artery without angina pectoris: Secondary | ICD-10-CM | POA: Diagnosis not present

## 2020-07-29 DIAGNOSIS — B952 Enterococcus as the cause of diseases classified elsewhere: Secondary | ICD-10-CM | POA: Diagnosis not present

## 2020-07-29 DIAGNOSIS — D509 Iron deficiency anemia, unspecified: Secondary | ICD-10-CM | POA: Diagnosis not present

## 2020-07-29 DIAGNOSIS — N184 Chronic kidney disease, stage 4 (severe): Secondary | ICD-10-CM | POA: Diagnosis not present

## 2020-07-29 DIAGNOSIS — D631 Anemia in chronic kidney disease: Secondary | ICD-10-CM | POA: Diagnosis not present

## 2020-07-29 DIAGNOSIS — E785 Hyperlipidemia, unspecified: Secondary | ICD-10-CM | POA: Diagnosis not present

## 2020-07-29 DIAGNOSIS — K219 Gastro-esophageal reflux disease without esophagitis: Secondary | ICD-10-CM | POA: Diagnosis not present

## 2020-07-29 DIAGNOSIS — I5040 Unspecified combined systolic (congestive) and diastolic (congestive) heart failure: Secondary | ICD-10-CM | POA: Diagnosis not present

## 2020-07-29 DIAGNOSIS — I252 Old myocardial infarction: Secondary | ICD-10-CM | POA: Diagnosis not present

## 2020-07-29 DIAGNOSIS — N390 Urinary tract infection, site not specified: Secondary | ICD-10-CM | POA: Diagnosis not present

## 2020-07-29 DIAGNOSIS — Z85038 Personal history of other malignant neoplasm of large intestine: Secondary | ICD-10-CM | POA: Diagnosis not present

## 2020-07-29 DIAGNOSIS — Z8673 Personal history of transient ischemic attack (TIA), and cerebral infarction without residual deficits: Secondary | ICD-10-CM | POA: Diagnosis not present

## 2020-07-29 DIAGNOSIS — F17210 Nicotine dependence, cigarettes, uncomplicated: Secondary | ICD-10-CM | POA: Diagnosis not present

## 2020-07-29 DIAGNOSIS — E1122 Type 2 diabetes mellitus with diabetic chronic kidney disease: Secondary | ICD-10-CM | POA: Diagnosis not present

## 2020-07-29 DIAGNOSIS — Z7982 Long term (current) use of aspirin: Secondary | ICD-10-CM | POA: Diagnosis not present

## 2020-07-29 DIAGNOSIS — I130 Hypertensive heart and chronic kidney disease with heart failure and stage 1 through stage 4 chronic kidney disease, or unspecified chronic kidney disease: Secondary | ICD-10-CM | POA: Diagnosis not present

## 2020-07-29 DIAGNOSIS — L03314 Cellulitis of groin: Secondary | ICD-10-CM | POA: Diagnosis not present

## 2020-07-29 DIAGNOSIS — J449 Chronic obstructive pulmonary disease, unspecified: Secondary | ICD-10-CM | POA: Diagnosis not present

## 2020-07-29 DIAGNOSIS — I255 Ischemic cardiomyopathy: Secondary | ICD-10-CM | POA: Diagnosis not present

## 2020-07-29 DIAGNOSIS — L02214 Cutaneous abscess of groin: Secondary | ICD-10-CM | POA: Diagnosis not present

## 2020-07-30 ENCOUNTER — Telehealth: Payer: Self-pay | Admitting: Internal Medicine

## 2020-07-30 NOTE — Telephone Encounter (Signed)
° ° ° °  [place] Name: [employer] Name: Annamarie Major Phone #: [phone] Service Requested:NURISNG, change drain (examples: OT/PT/Skilled Nursing/Social Work/Speech Therapy/Wound Care) Frequency of Visits: 2W4, 1 PRN

## 2020-07-31 NOTE — Telephone Encounter (Signed)
Called Tonya back there was no answer LMOM response./lmb

## 2020-07-31 NOTE — Telephone Encounter (Signed)
Okay.  Thanks.

## 2020-08-01 ENCOUNTER — Encounter (HOSPITAL_BASED_OUTPATIENT_CLINIC_OR_DEPARTMENT_OTHER): Payer: Medicare Other | Attending: Internal Medicine | Admitting: Internal Medicine

## 2020-08-01 ENCOUNTER — Other Ambulatory Visit: Payer: Self-pay

## 2020-08-01 ENCOUNTER — Other Ambulatory Visit: Payer: Self-pay | Admitting: Surgery

## 2020-08-01 DIAGNOSIS — Z09 Encounter for follow-up examination after completed treatment for conditions other than malignant neoplasm: Secondary | ICD-10-CM | POA: Insufficient documentation

## 2020-08-01 DIAGNOSIS — L02214 Cutaneous abscess of groin: Secondary | ICD-10-CM | POA: Diagnosis not present

## 2020-08-01 DIAGNOSIS — E119 Type 2 diabetes mellitus without complications: Secondary | ICD-10-CM | POA: Insufficient documentation

## 2020-08-01 DIAGNOSIS — Z872 Personal history of diseases of the skin and subcutaneous tissue: Secondary | ICD-10-CM | POA: Diagnosis not present

## 2020-08-01 DIAGNOSIS — L02211 Cutaneous abscess of abdominal wall: Secondary | ICD-10-CM

## 2020-08-01 DIAGNOSIS — L98492 Non-pressure chronic ulcer of skin of other sites with fat layer exposed: Secondary | ICD-10-CM | POA: Diagnosis not present

## 2020-08-01 NOTE — Progress Notes (Signed)
KAMORIA, LUCIEN ([id number]) Visit Report for 08/01/2020 Arrival Information Details Patient Name: Date of Service: LUPIE, SAWA 08/01/2020 12:30 PM Medical Record Number: [id number] Patient Account Number: [id number] Date of Birth/Sex: Treating RN: 10/26/1950 (70 y.o. [name] Primary Care Elianne Gubser: [name] Other Clinician: Referring Ova Gillentine: Treating Jacqulyne Gladue/Extender: Thayer Headings in Treatment: 10 Visit Information History Since Last Visit Added or deleted any medications: No Patient Arrived: Ambulatory Any new allergies or adverse reactions: No Arrival Time: 12:44 Had a fall or experienced change in No Accompanied By: alone activities of daily living that may affect Transfer Assistance: None risk of falls: Patient Identification Verified: Yes Signs or symptoms of abuse/neglect since last visito No Secondary Verification Process Completed: Yes Hospitalized since last visit: No Patient Requires Transmission-Based Precautions: No Implantable device outside of the clinic excluding No Patient Has Alerts: No cellular tissue based products placed in the center since last visit: Pain Present Now: No Electronic Signature(s) Signed: 08/01/2020 5:48:06 PM By: Levan Hurst RN, BSN Entered By: Levan Hurst on 08/01/2020 12:44:45 -------------------------------------------------------------------------------- Clinic Level of Care Assessment Details Patient Name: Date of Service: SHANAY, WOOLMAN A. 08/01/2020 12:30 PM Medical Record Number: [id number] Patient Account Number: [id number] Date of Birth/Sex: Treating RN: 1951/02/15 (70 y.o. Helene Shoe, Meta.Reding Primary Care Jaleeya Mcnelly: [name] Other Clinician: Referring Abshir Paolini: Treating Lenward Able/Extender: Thayer Headings in Treatment: 10 Clinic Level of Care Assessment Items TOOL 4 Quantity Score X- 1 0 Use when only an EandM is  performed on FOLLOW-UP visit ASSESSMENTS - Nursing Assessment / Reassessment X- 1 10 Reassessment of Co-morbidities (includes updates in patient status) X- 1 5 Reassessment of Adherence to Treatment Plan ASSESSMENTS - Wound and Skin A ssessment / Reassessment []  - 0 Simple Wound Assessment / Reassessment - one wound []  - 0 Complex Wound Assessment / Reassessment - multiple wounds X- 1 10 Dermatologic / Skin Assessment (not related to wound area) ASSESSMENTS - Focused Assessment []  - 0 Circumferential Edema Measurements - multi extremities []  - 0 Nutritional Assessment / Counseling / Intervention []  - 0 Lower Extremity Assessment (monofilament, tuning fork, pulses) []  - 0 Peripheral Arterial Disease Assessment (using hand held doppler) ASSESSMENTS - Ostomy and/or Continence Assessment and Care []  - 0 Incontinence Assessment and Management []  - 0 Ostomy Care Assessment and Management (repouching, etc.) PROCESS - Coordination of Care X - Simple Patient / Family Education for ongoing care 1 15 []  - 0 Complex (extensive) Patient / Family Education for ongoing care X- 1 10 Staff obtains Consents, Records, T Results / Process Orders est X- 1 10 Staff telephones HHA, Nursing Homes / Clarify orders / etc []  - 0 Routine Transfer to another Facility (non-emergent condition) []  - 0 Routine Hospital Admission (non-emergent condition) []  - 0 New Admissions / Biomedical engineer / Ordering NPWT Apligraf, etc. , []  - 0 Emergency Hospital Admission (emergent condition) X- 1 10 Simple Discharge Coordination []  - 0 Complex (extensive) Discharge Coordination PROCESS - Special Needs []  - 0 Pediatric / Minor Patient Management []  - 0 Isolation Patient Management []  - 0 Hearing / Language / Visual special needs []  - 0 Assessment of Community assistance (transportation, D/C planning, etc.) []  - 0 Additional assistance / Altered mentation []  - 0 Support Surface(s) Assessment  (bed, cushion, seat, etc.) INTERVENTIONS - Wound Cleansing / Measurement []  - 0 Simple Wound Cleansing - one wound []  - 0 Complex Wound Cleansing - multiple wounds []  - 0 Wound Imaging (  photographs - any number of wounds) []  - 0 Wound Tracing (instead of photographs) []  - 0 Simple Wound Measurement - one wound []  - 0 Complex Wound Measurement - multiple wounds INTERVENTIONS - Wound Dressings []  - 0 Small Wound Dressing one or multiple wounds []  - 0 Medium Wound Dressing one or multiple wounds []  - 0 Large Wound Dressing one or multiple wounds []  - 0 Application of Medications - topical []  - 0 Application of Medications - injection INTERVENTIONS - Miscellaneous []  - 0 External ear exam []  - 0 Specimen Collection (cultures, biopsies, blood, body fluids, etc.) []  - 0 Specimen(s) / Culture(s) sent or taken to Lab for analysis []  - 0 Patient Transfer (multiple staff / Civil Service fast streamer / Similar devices) []  - 0 Simple Staple / Suture removal (25 or less) []  - 0 Complex Staple / Suture removal (26 or more) []  - 0 Hypo / Hyperglycemic Management (close monitor of Blood Glucose) []  - 0 Ankle / Brachial Index (ABI) - do not check if billed separately X- 1 5 Vital Signs Has the patient been seen at the hospital within the last three years: Yes Total Score: 75 Level Of Care: New/Established - Level 2 Electronic Signature(s) Signed: 08/01/2020 5:42:01 PM By: Deon Pilling Entered By: Deon Pilling on 08/01/2020 13:41:36 -------------------------------------------------------------------------------- Encounter Discharge Information Details Patient Name: Date of Service: Alferd Patee A. 08/01/2020 12:30 PM Medical Record Number: [id number] Patient Account Number: [id number] Date of Birth/Sex: Treating RN: 11/03/1950 (70 y.o. [name] Primary Care Jurgen Groeneveld: [name] Other Clinician: Referring Gurman Ashland: Treating Deborrah Mabin/Extender: Thayer Headings in Treatment: 10 Encounter Discharge Information Items Discharge Condition: Stable Ambulatory Status: Ambulatory Discharge Destination: Home Transportation: Private Auto Accompanied By: self Schedule Follow-up Appointment: No Clinical Summary of Care: Electronic Signature(s) Signed: 08/01/2020 5:42:01 PM By: Deon Pilling Entered By: Deon Pilling on 08/01/2020 17:13:41 -------------------------------------------------------------------------------- Multi Wound Chart Details Patient Name: Date of Service: Alferd Patee A. 08/01/2020 12:30 PM Medical Record Number: [id number] Patient Account Number: [id number] Date of Birth/Sex: Treating RN: 1951-02-09 (70 y.o. [name] Primary Care Taneisha Fuson: [name] Other Clinician: Referring Karianna Gusman: Treating Alisi Lupien/Extender: Thayer Headings in Treatment: 10 Vital Signs Height(in): 62 Pulse(bpm): 83 Weight(lbs): 172 Blood Pressure(mmHg): 138/79 Body Mass Index(BMI): 31 Temperature(F): 98.4 Respiratory Rate(breaths/min): 16 Wound Assessments Treatment Notes Electronic Signature(s) Signed: 08/01/2020 4:56:03 PM By: Linton Ham MD Signed: 08/01/2020 5:42:01 PM By: Deon Pilling Entered By: Linton Ham on 08/01/2020 13:29:38 -------------------------------------------------------------------------------- Multi-Disciplinary Care Plan Details Patient Name: Date of Service: Alferd Patee A. 08/01/2020 12:30 PM Medical Record Number: [id number] Patient Account Number: [id number] Date of Birth/Sex: Treating RN: April 08, 1951 (70 y.o. [name] Primary Care Darianna Amy: [name] Other Clinician: Referring Grayling Schranz: Treating Connelly Spruell/Extender: Thayer Headings in Treatment: 10 Active Inactive Electronic Signature(s) Signed: 08/01/2020 5:42:01 PM By: Deon Pilling Entered By: Deon Pilling on 08/01/2020  13:29:47 -------------------------------------------------------------------------------- Pain Assessment Details Patient Name: Date of Service: TUYET, BADER 08/01/2020 12:30 PM Medical Record Number: [id number] Patient Account Number: [id number] Date of Birth/Sex: Treating RN: June 18, 1951 (70 y.o. [name] Primary Care Braydon Kullman: [name] Other Clinician: Referring Taneasha Fuqua: Treating Zelphia Glover/Extender: Thayer Headings in Treatment: 10 Active Problems Location of Pain Severity and Description of Pain Patient Has Paino No Site Locations Pain Management and Medication Current Pain Management: Electronic Signature(s) Signed: 08/01/2020 5:48:06 PM By: Levan Hurst RN, BSN Entered By: Levan Hurst on 08/01/2020  12:45:10 -------------------------------------------------------------------------------- Patient/Caregiver Education Details Patient Name: Date of Service: AIRIEL, OBLINGER 1/27/2022andnbsp12:30 PM Medical Record Number: [id number] Patient Account Number: [id number] Date of Birth/Gender: Treating RN: 1951-04-04 (70 y.o. [name] Primary Care Physician: [name] Other Clinician: Referring Physician: Treating Physician/Extender: Thayer Headings in Treatment: 10 Education Assessment Education Provided To: Patient Education Topics Provided Wound/Skin Impairment: Handouts: Skin Care Do's and Dont's Methods: Explain/Verbal Responses: Reinforcements needed Electronic Signature(s) Signed: 08/01/2020 5:42:01 PM By: Deon Pilling Entered By: Deon Pilling on 08/01/2020 13:23:43 -------------------------------------------------------------------------------- Vitals Details Patient Name: Date of Service: Alferd Patee A. 08/01/2020 12:30 PM Medical Record Number: [id number] Patient Account Number: [id number] Date of Birth/Sex: Treating RN: [birth year] (70 y.o. [name] Primary Care Sumedha Munnerlyn: [name] Other Clinician: Referring Ree Alcalde: Treating Alphonzo Devera/Extender: Thayer Headings in Treatment: 10 Vital Signs Time Taken: 12:44 Temperature (F): 98.4 Height (in): 62 Pulse (bpm): 83 Weight (lbs): 172 Respiratory Rate (breaths/min): 16 Body Mass Index (BMI): 31.5 Blood Pressure (mmHg): 138/79 Reference Range: 80 - 120 mg / dl Electronic Signature(s) Signed: 08/01/2020 5:48:06 PM By: Levan Hurst RN, BSN Entered By: Levan Hurst on 08/01/2020 12:45:04

## 2020-08-01 NOTE — Progress Notes (Signed)
[name], [name] ([id number]) Visit Report for 08/01/2020 HPI Details Patient Name: Date of Service: [name], [name] 08/01/2020 12:30 PM Medical Record Number: [id number] Patient Account Number: [id number] Date of Birth/Sex: Treating RN: [birth year] (70 y.o. [name] Primary Care Provider: [name] Other Clinician: Referring Provider: Treating Provider/Extender: Thayer Headings in Treatment: 10 History of Present Illness HPI Description: 04/19/2020 ADMISSION this is a patient who became acutely ill with sepsis in late March. She required admission to hospital from 03/05/2020 through 9//9/21 with abscesses in her groin. She was septic with Enterococcus faecalis she is treated initially with Vanco and Zosyn but discharged on amoxicillin. She required surgical debridement of 3 areas including 1 in the anterior thigh and 2 on the right buttock. She was discharged to [facility]. 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 She was seen by plastic surgery including Dr. Heber [place] and then by Phoebe Sharps one of their PAs twice. At that point they described the areas on the right inner groin and then 3 new abdominal wounds. The last culture I can see done by plastic surgery on 11/3 showed both Klebsiella and Enterobacter. She was on Bactrim for a. And more recently is now on Amoxil presumably for a Streptococcus species on 11/2. She is still taking Amoxil. She has not been feeling systemically unwell no fever no nausea she is back at home. She has home health putting calcium alginate on the wounds. I have included the original CT scan report before her surgery. I am not completely certain that the extent of this was appreciated. This may have accounted for why she developed recurrent areas after discharge from our hospital on her lower abdomen which is clearly commented  on in the CT scan report. I am not sure I could paraphrase this any better ADDENDUM REPORT 03/06/2020 21:05 : ADDENDUM: Critical Value/emergent results were called by telephone at the time of interpretation on 03/06/2020 at 9:05 pm to provider Sharlet Salina NP , who verbally acknowledged these results. Electronically Signed By: Lovena Le M.D. On: 03/06/2020 21:05 Signed by Briant Cedar, MD on 03/06/2020 9:08 PM Narrative and Impression CLINICAL DATA: Abdominal pain, abdominal and groin cellulitis EXAM: CT ABDOMEN AND PELVIS WITHOUT CONTRAST TECHNIQUE: Multidetector CT imaging of the abdomen and pelvis was performed following the standard protocol without IV contrast. COMPARISON: None. FINDINGS: Lower chest: Lung bases are clear. Cardiac size at the upper limits normal. Coronary artery calcifications are present. Suspect calcification of the mitral annulus as well. Hypoattenuation of the cardiac blood pool may reflect a mild anemia. Hepatobiliary: No visible concerning liver lesion within the limitations of this unenhanced of motion degraded CT Smooth liver . surface contour. Normal liver attenuation. Gallbladder is unremarkable. Question some biliary ductal dilatation difficult to fully discern given the lack of contrast media and extensive motion artifact. The extrahepatic common bile duct may measure up to 1.5 cm at the level of the pancreatic head. No visible intraductal gallstones. Pancreas: Poorly assessed given extensive motion artifact photon starvation. No pancreatic ductal dilatation or surrounding inflammatory changes. Spleen: Normal in size. No concerning splenic lesions. Splenic hilar vascular calcifications are noted. Adrenals/Urinary Tract: Adrenal glands are unremarkable. Kidneys are symmetric in size and normally located. Question some mild perinephric stranding versus volume averaging in the setting of motion artifact. No visible concerning renal lesion. No  urolithiasis or hydronephrosis. Urinary bladder is  unremarkable. Stomach/Bowel: Small sliding-type hiatal hernia. Distal stomach and duodenum are unremarkable. No small or large bowel thickening or dilatation. No evidence of bowel obstruction. Appendix is not visualized. No focal inflammation the vicinity of the cecum to suggest an occult appendicitis. Vascular/Lymphatic: Atherosclerotic calcifications within the abdominal aorta and branch vessels. No aneurysm or ectasia. No enlarged abdominopelvic lymph nodes. Likely reactive adenopathy seen in the inguinal regions. Reproductive: Anteverted uterus. Question endometrial thickening, greater than expected for patient age measuring up to 10 mm in struck thickness. No concerning adnexal lesions. Few parametrial calcifications are atypical senescent finding Other: There is extensive soft tissue stranding and thickening extending across the low anterior abdominal wall/panniculus. Large lobular phlegmon in the right groin/pudendal tissues superficial to the right adductor musculature and right inferior pubic ramus anteriorly with small amount of soft tissue gas. Is region is difficult to accurately measure given the lobular margins of med does extend up to 4.3 x 6.2 cm in maximal transaxial dimensions. Organized collection/abscess formation is difficult to ascertain in the absence of contrast media. Musculoskeletal: Soft tissue thickening and edematous changes of the adductor compartment of the right thigh could reflect some reactive or inflammatory myositis. No discernible intramuscular collection is seen. No acute or suspicious osseous lesions. Multilevel degenerative changes in the spine, hips and pelvis. Mild grade 1 anterolisthesis L4 on 5. No spondylolysis is evident. IMPRESSION: 1. Extensive soft tissue stranding and thickening extending across the low anterior abdominal wall/panniculus with a large, heterogeneous phlegmon within the  deep space of the right groin and pudendal soft tissues along the right adductor musculature and right inferior pubic ramus with small amount of soft tissue gas. Organized collection/abscess formation is difficult to ascertain in the absence of contrast media. Findings are concerning for severe cellulitis and aggressive soft tissue infection. Emergent surgical consultation is warranted. 2. Soft tissue thickening and edematous changes of the adductor compartment of the right thigh could reflect some reactive or inflammatory myositis. No discernible intramuscular collection is seen. 3. Question endometrial thickening, greater than expected for patient age measuring up to 10 mm in thickness. Recommend further evaluation with outpatient pelvic ultrasound when patient is able to better tolerate. 4. Aortic Atherosclerosis (ICD10-I70.0). Currently attempting to contact the ordering provider with a critical value result. Addendum will be submitted upon case discussion. Electronically Signed: By: Lovena Le M.D. On: 03/06/2020 21:02 05/27/2020 upon evaluation today patient appears to be doing better in my opinion with regard to some of the abscess areas currently. She has her CT scan coming up shortly over the next week. With that being said she has been on Augmentin 500 mg which has seemed to help her to be honest. We did have a culture as well that was obtained at the last visit and showed Streptococcus anginosus with that being said I do believe that it may be helpful for [place] to go ahead and extend her Augmentin which is on Thursday for another 2 weeks just to make sure that we keep things under control since she does seem to be doing better the patient is in agreement with that plan. 12/6; 2-week follow-up. The CT scan showed a sizable abscess in the anterior pelvic wall. I contacted interventional radiology Dr. Earleen Newport and we are arranging to have this drained. In the meantime her culture  showed strep anginosis and she is on Augmentin. Everything in the right medial thigh and groin closed over. The area on her right abdominal wall was closed over however the area on the  superior part of her left symphysis is still open but with less depth. There is no purulent drainage The patient is going to need the abscess drained although we still do not yet have a firm appointment from interventional radiology. We will check into this either today or tomorrow. I would like to make sure that we get a culture at the time they do this. She has enough antibiotics until Friday 12/20; patient returns to our clinic. She had a drain placed and she still has a drainage bag in place. She is draining between 20 and 40 cc/day. Unfortunately the culture at the time this was placed was negative which is a bit surprising and she has continued to drain on a daily basis. This was placed on 06/17/2020 by Dr. Earleen Newport of interventional radiology. According the patient she is going to have another CT scan done early in the new year to check on possible reaccumulations of fluid. Not surprisingly her draining areas on the left upper medial thigh as well as the top of the symphysis pubis of closed there is no open wound. I have reviewed her previous cultures on 8/31 Streptococcus anginosus. On 10/28 I believe her initial operative consult showed Streptococcus anginosus, Enterococcus faecalis and Klebsiella. On arrival in this clinic again Streptococcus anginosus. 1/27; the patient arrives back in our clinic. All her draining areas have closed but she still has the drain in place in her right lower quadrant. My thought was she was supposed to have a repeat imaging here and return to interventional radiology to see if the drain would be removed however I can see that she missed an appointment in epic and she says nobody called her. We will try to set this up When she was last here 5 weeks ago I think I gave her 10 days of  Levaquin she said she took 3 days and she developed a rash so she stopped it. Electronic Signature(s) Signed: 08/01/2020 4:56:03 PM By: Linton Ham MD Entered By: Linton Ham on 08/01/2020 13:30:44 -------------------------------------------------------------------------------- Physical Exam Details Patient Name: Date of Service: [name] Patee A. 08/01/2020 12:30 PM Medical Record Number: [id number] Patient Account Number: [id number] Date of Birth/Sex: Treating RN: 1951/01/27 (71 y.o. [name] Primary Care Provider: [name] Other Clinician: Referring Provider: Treating Provider/Extender: Thayer Headings in Treatment: 10 Constitutional Sitting or standing Blood Pressure is within target range for patient.. Pulse regular and within target range for patient.[name] Respirations regular, non-labored and within target range.. Temperature is normal and within the target range for the patient.[name] Appears in no distress. Gastrointestinal (GI) There is no tenderness around this area. Integumentary (Hair, Skin) She has very dry skin but I do not see a rash. Notes 08/01/2020; the patient has no open wound however she still has the drain which is draining about 5 cc/day Electronic Signature(s) Signed: 08/01/2020 4:56:03 PM By: Linton Ham MD Entered By: Linton Ham on 08/01/2020 13:32:22 -------------------------------------------------------------------------------- Physician Orders Details Patient Name: Date of Service: [name] Patee A. 08/01/2020 12:30 PM Medical Record Number: [id number] Patient Account Number: [id number] Date of Birth/Sex: Treating RN: 05-29-1951 (70 y.o. [name] Primary Care Provider: [name] Other Clinician: Referring Provider: Treating Provider/Extender: Thayer Headings in Treatment: 10 Verbal / Phone Orders: No Diagnosis Coding Follow-up Appointments Other:  - Patient to call if unable to get in with [facility]. Call if JP drain has been removed then will discharge you. Bathing/ Shower/ Hygiene May  shower and wash wound with soap and water. - on days that dressing is changed Home Health No change in wound care orders this week; continue Home Health for wound care. May utilize formulary equivalent dressing for wound treatment orders unless otherwise specified. - [facility] Other Home Health Orders/Instructions: - [place] will phone [facility] related to [facility] placement. Patient to please follow up with Dr. Earleen Newport at [place] [phone]. Electronic Signature(s) Signed: 08/01/2020 4:56:03 PM By: Linton Ham MD Signed: 08/01/2020 5:42:01 PM By: Deon Pilling Entered By: Deon Pilling on 08/01/2020 13:38:20 -------------------------------------------------------------------------------- Problem List Details Patient Name: Date of Service: [name] Patee A. 08/01/2020 12:30 PM Medical Record Number: [id number] Patient Account Number: [id number] Date of Birth/Sex: Treating RN: 1951-06-29 (70 y.o. 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 08/01/2020 12:30 PM Medical Record Number:  [id number] Patient Account Number: [id number] Date of Birth/Sex: Treating RN: 07-08-1950 (70 y.o. [name] Primary Care Provider: [name] Other Clinician: Referring Provider: Treating Provider/Extender: Thayer Headings in Treatment: 10 Subjective History of Present Illness (HPI) 04/19/2020 ADMISSION this is a patient who became acutely ill with sepsis in late March. She required admission to hospital from 03/05/2020 through 9//9/21 with abscesses in her groin. She was septic with Enterococcus faecalis she is treated initially with Vanco and Zosyn but discharged on amoxicillin. She required surgical debridement of 3 areas including 1 in the anterior thigh and 2 on the right buttock. She was discharged to [facility]. 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 She was seen by plastic surgery including Dr. Heber [place] and then by Phoebe Sharps one of their PAs twice. At that point they described the areas on the right inner groin and then 3 new abdominal wounds. The last culture I can see done by plastic surgery on 11/3 showed both Klebsiella and Enterobacter. She was on Bactrim for a. And more recently is now on Amoxil presumably for a Streptococcus species on 11/2. She is still taking Amoxil. She has not been feeling systemically unwell no fever no nausea she is back at home. She has home health putting calcium alginate on the wounds. I have included the original CT scan report before her surgery. I am not completely certain that the extent of this was appreciated. This may have accounted for why she developed recurrent areas after discharge from our hospital on her lower abdomen which is clearly commented on in the CT scan report. I am not sure I could paraphrase this any better ADDENDUM REPORT 03/06/2020 21:05 : ADDENDUM: Critical Value/emergent results were called  by telephone at the time of interpretation on 03/06/2020 at 9:05 pm to provider Sharlet Salina NP , who verbally acknowledged these results. Electronically Signed By: Lovena Le M.D. On: 03/06/2020 21:05 Signed by Briant Cedar, MD on 03/06/2020 9:08 PM Narrative and Impression CLINICAL DATA: Abdominal pain, abdominal  and groin cellulitis EXAM: CT ABDOMEN AND PELVIS WITHOUT CONTRAST TECHNIQUE: Multidetector CT imaging of the abdomen and pelvis was performed following the standard protocol without IV contrast. COMPARISON: None. FINDINGS: Lower chest: Lung bases are clear. Cardiac size at the upper limits normal. Coronary artery calcifications are present. Suspect calcification of the mitral annulus as well. Hypoattenuation of the cardiac blood pool may reflect a mild anemia. Hepatobiliary: No visible concerning liver lesion within the limitations of this unenhanced of motion degraded CT Smooth liver . surface contour. Normal liver attenuation. Gallbladder is unremarkable. Question some biliary ductal dilatation difficult to fully discern given the lack of contrast media and extensive motion artifact. The extrahepatic common bile duct may measure up to 1.5 cm at the level of the pancreatic head. No visible intraductal gallstones. Pancreas: Poorly assessed given extensive motion artifact photon starvation. No pancreatic ductal dilatation or surrounding inflammatory changes. Spleen: Normal in size. No concerning splenic lesions. Splenic hilar vascular calcifications are noted. Adrenals/Urinary Tract: Adrenal glands are unremarkable. Kidneys are symmetric in size and normally located. Question some mild perinephric stranding versus volume averaging in the setting of motion artifact. No visible concerning renal lesion. No urolithiasis or hydronephrosis. Urinary bladder is unremarkable. Stomach/Bowel: Small sliding-type hiatal hernia. Distal stomach and duodenum are unremarkable. No small or  large bowel thickening or dilatation. No evidence of bowel obstruction. Appendix is not visualized. No focal inflammation the vicinity of the cecum to suggest an occult appendicitis. Vascular/Lymphatic: Atherosclerotic calcifications within the abdominal aorta and branch vessels. No aneurysm or ectasia. No enlarged abdominopelvic lymph nodes. Likely reactive adenopathy seen in the inguinal regions. Reproductive: Anteverted uterus. Question endometrial thickening, greater than expected for patient age measuring up to 10 mm in struck thickness. No concerning adnexal lesions. Few parametrial calcifications are atypical senescent finding Other: There is extensive soft tissue stranding and thickening extending across the low anterior abdominal wall/panniculus. Large lobular phlegmon in the right groin/pudendal tissues superficial to the right adductor musculature and right inferior pubic ramus anteriorly with small amount of soft tissue gas. Is region is difficult to accurately measure given the lobular margins of med does extend up to 4.3 x 6.2 cm in maximal transaxial dimensions. Organized collection/abscess formation is difficult to ascertain in the absence of contrast media. Musculoskeletal: Soft tissue thickening and edematous changes of the adductor compartment of the right thigh could reflect some reactive or inflammatory myositis. No discernible intramuscular collection is seen. No acute or suspicious osseous lesions. Multilevel degenerative changes in the spine, hips and pelvis. Mild grade 1 anterolisthesis L4 on 5. No spondylolysis is evident. IMPRESSION: 1. Extensive soft tissue stranding and thickening extending across the low anterior abdominal wall/panniculus with a large, heterogeneous phlegmon within the deep space of the right groin and pudendal soft tissues along the right adductor musculature and right inferior pubic ramus with small amount of soft tissue gas.  Organized collection/abscess formation is difficult to ascertain in the absence of contrast media. Findings are concerning for severe cellulitis and aggressive soft tissue infection. Emergent surgical consultation is warranted. 2. Soft tissue thickening and edematous changes of the adductor compartment of the right thigh could reflect some reactive or inflammatory myositis. No discernible intramuscular collection is seen. 3. Question endometrial thickening, greater than expected for patient age measuring up to 10 mm in thickness. Recommend further evaluation with outpatient pelvic ultrasound when patient is able to better tolerate. 4. Aortic Atherosclerosis (ICD10-I70.0). Currently attempting to contact the ordering provider with a critical value result. Addendum will be  submitted upon case discussion. Electronically Signed: By: Lovena Le M.D. On: 03/06/2020 21:02 05/27/2020 upon evaluation today patient appears to be doing better in my opinion with regard to some of the abscess areas currently. She has her CT scan coming up shortly over the next week. With that being said she has been on Augmentin 500 mg which has seemed to help her to be honest. We did have a culture as well that was obtained at the last visit and showed Streptococcus anginosus with that being said I do believe that it may be helpful for [place] to go ahead and extend her Augmentin which is on Thursday for another 2 weeks just to make sure that we keep things under control since she does seem to be doing better the patient is in agreement with that plan. 12/6; 2-week follow-up. The CT scan showed a sizable abscess in the anterior pelvic wall. I contacted interventional radiology Dr. Earleen Newport and we are arranging to have this drained. In the meantime her culture showed strep anginosis and she is on Augmentin. Everything in the right medial thigh and groin closed over. The area on her right abdominal wall was closed over  however the area on the superior part of her left symphysis is still open but with less depth. There is no purulent drainage The patient is going to need the abscess drained although we still do not yet have a firm appointment from interventional radiology. We will check into this either today or tomorrow. I would like to make sure that we get a culture at the time they do this. She has enough antibiotics until Friday 12/20; patient returns to our clinic. She had a drain placed and she still has a drainage bag in place. She is draining between 20 and 40 cc/day. Unfortunately the culture at the time this was placed was negative which is a bit surprising and she has continued to drain on a daily basis. This was placed on 06/17/2020 by Dr. Earleen Newport of interventional radiology. According the patient she is going to have another CT scan done early in the new year to check on possible reaccumulations of fluid. Not surprisingly her draining areas on the left upper medial thigh as well as the top of the symphysis pubis of closed there is no open wound. I have reviewed her previous cultures on 8/31 Streptococcus anginosus. On 10/28 I believe her initial operative consult showed Streptococcus anginosus, Enterococcus faecalis and Klebsiella. On arrival in this clinic again Streptococcus anginosus. 1/27; the patient arrives back in our clinic. All her draining areas have closed but she still has the drain in place in her right lower quadrant. My thought was she was supposed to have a repeat imaging here and return to interventional radiology to see if the drain would be removed however I can see that she missed an appointment in epic and she says nobody called her. We will try to set this up When she was last here 5 weeks ago I think I gave her 10 days of Levaquin she said she took 3 days and she developed a rash so she stopped it. Objective Constitutional Sitting or standing Blood Pressure is within target range  for patient.. Pulse regular and within target range for patient.[name] Respirations regular, non-labored and within target range.. Temperature is normal and within the target range for the patient.[name] Appears in no distress. Vitals Time Taken: 12:44 PM, Height: 62 in, Weight: 172 lbs, BMI: 31.5, Temperature: 98.4 F, Pulse: 83 bpm,  Respiratory Rate: 16 breaths/min, Blood Pressure: 138/79 mmHg. Gastrointestinal (GI) There is no tenderness around this area. General Notes: 08/01/2020; the patient has no open wound however she still has the drain which is draining about 5 cc/day Integumentary (Hair, Skin) She has very dry skin but I do not see a rash. 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 Call if JP drain has been removed then will discharge you. Bathing/ Shower/ Hygiene: May shower and wash wound with soap and water. - on days that dressing is changed Home Health: No change in wound care orders this week; continue Home Health for wound care. May utilize formulary equivalent dressing for wound treatment orders unless otherwise specified. - [facility] Other Home Health Orders/Instructions: - [place] will phone [place] [facility] related to [place] placement. Patient to please follow up with Dr. Earleen Newport at [facility] [phone]. #1 we are going to reach out to interventional radiology to see what needs to be done I suspect she will need another image perhaps an ultrasound or CT scan and then removal of the drain if necessary. She says the drainage is down to about 5 cc per 24 hours down from 15 last week 2. She has no subsequent open area currently. I did not check the previously involved areas. 3. Will remain available if things need to be ironed out for this patient. I feel a bit  badly that this seemed to of not happened this month although I am not sure what our role was in this if any Electronic Signature(s) Signed: 08/01/2020 4:56:03 PM By: Linton Ham MD Signed: 08/01/2020 5:42:01 PM By: Deon Pilling Entered By: Deon Pilling on 08/01/2020 13:38:50 -------------------------------------------------------------------------------- SuperBill Details Patient Name: Date of Service: [name] Patee A. 08/01/2020 Medical Record Number: [id number] Patient Account Number: [id number] Date of Birth/Sex: Treating RN: 05/19/1951 (70 y.o. 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

## 2020-08-02 DIAGNOSIS — B952 Enterococcus as the cause of diseases classified elsewhere: Secondary | ICD-10-CM | POA: Diagnosis not present

## 2020-08-02 DIAGNOSIS — Z85038 Personal history of other malignant neoplasm of large intestine: Secondary | ICD-10-CM | POA: Diagnosis not present

## 2020-08-02 DIAGNOSIS — E785 Hyperlipidemia, unspecified: Secondary | ICD-10-CM | POA: Diagnosis not present

## 2020-08-02 DIAGNOSIS — Z8673 Personal history of transient ischemic attack (TIA), and cerebral infarction without residual deficits: Secondary | ICD-10-CM | POA: Diagnosis not present

## 2020-08-02 DIAGNOSIS — I5040 Unspecified combined systolic (congestive) and diastolic (congestive) heart failure: Secondary | ICD-10-CM | POA: Diagnosis not present

## 2020-08-02 DIAGNOSIS — E1122 Type 2 diabetes mellitus with diabetic chronic kidney disease: Secondary | ICD-10-CM | POA: Diagnosis not present

## 2020-08-02 DIAGNOSIS — D509 Iron deficiency anemia, unspecified: Secondary | ICD-10-CM | POA: Diagnosis not present

## 2020-08-02 DIAGNOSIS — F17210 Nicotine dependence, cigarettes, uncomplicated: Secondary | ICD-10-CM | POA: Diagnosis not present

## 2020-08-02 DIAGNOSIS — L03314 Cellulitis of groin: Secondary | ICD-10-CM | POA: Diagnosis not present

## 2020-08-02 DIAGNOSIS — L02214 Cutaneous abscess of groin: Secondary | ICD-10-CM | POA: Diagnosis not present

## 2020-08-02 DIAGNOSIS — I2510 Atherosclerotic heart disease of native coronary artery without angina pectoris: Secondary | ICD-10-CM | POA: Diagnosis not present

## 2020-08-02 DIAGNOSIS — N390 Urinary tract infection, site not specified: Secondary | ICD-10-CM | POA: Diagnosis not present

## 2020-08-02 DIAGNOSIS — I252 Old myocardial infarction: Secondary | ICD-10-CM | POA: Diagnosis not present

## 2020-08-02 DIAGNOSIS — N184 Chronic kidney disease, stage 4 (severe): Secondary | ICD-10-CM | POA: Diagnosis not present

## 2020-08-02 DIAGNOSIS — I255 Ischemic cardiomyopathy: Secondary | ICD-10-CM | POA: Diagnosis not present

## 2020-08-02 DIAGNOSIS — D631 Anemia in chronic kidney disease: Secondary | ICD-10-CM | POA: Diagnosis not present

## 2020-08-02 DIAGNOSIS — Z7982 Long term (current) use of aspirin: Secondary | ICD-10-CM | POA: Diagnosis not present

## 2020-08-02 DIAGNOSIS — J449 Chronic obstructive pulmonary disease, unspecified: Secondary | ICD-10-CM | POA: Diagnosis not present

## 2020-08-02 DIAGNOSIS — K219 Gastro-esophageal reflux disease without esophagitis: Secondary | ICD-10-CM | POA: Diagnosis not present

## 2020-08-02 DIAGNOSIS — I130 Hypertensive heart and chronic kidney disease with heart failure and stage 1 through stage 4 chronic kidney disease, or unspecified chronic kidney disease: Secondary | ICD-10-CM | POA: Diagnosis not present

## 2020-08-04 DIAGNOSIS — D509 Iron deficiency anemia, unspecified: Secondary | ICD-10-CM | POA: Diagnosis not present

## 2020-08-04 DIAGNOSIS — E785 Hyperlipidemia, unspecified: Secondary | ICD-10-CM | POA: Diagnosis not present

## 2020-08-04 DIAGNOSIS — L03314 Cellulitis of groin: Secondary | ICD-10-CM | POA: Diagnosis not present

## 2020-08-04 DIAGNOSIS — L02214 Cutaneous abscess of groin: Secondary | ICD-10-CM | POA: Diagnosis not present

## 2020-08-04 DIAGNOSIS — E1122 Type 2 diabetes mellitus with diabetic chronic kidney disease: Secondary | ICD-10-CM | POA: Diagnosis not present

## 2020-08-04 DIAGNOSIS — K219 Gastro-esophageal reflux disease without esophagitis: Secondary | ICD-10-CM | POA: Diagnosis not present

## 2020-08-04 DIAGNOSIS — Z8673 Personal history of transient ischemic attack (TIA), and cerebral infarction without residual deficits: Secondary | ICD-10-CM | POA: Diagnosis not present

## 2020-08-04 DIAGNOSIS — I252 Old myocardial infarction: Secondary | ICD-10-CM | POA: Diagnosis not present

## 2020-08-04 DIAGNOSIS — I2510 Atherosclerotic heart disease of native coronary artery without angina pectoris: Secondary | ICD-10-CM | POA: Diagnosis not present

## 2020-08-04 DIAGNOSIS — Z7982 Long term (current) use of aspirin: Secondary | ICD-10-CM | POA: Diagnosis not present

## 2020-08-04 DIAGNOSIS — N184 Chronic kidney disease, stage 4 (severe): Secondary | ICD-10-CM | POA: Diagnosis not present

## 2020-08-04 DIAGNOSIS — I130 Hypertensive heart and chronic kidney disease with heart failure and stage 1 through stage 4 chronic kidney disease, or unspecified chronic kidney disease: Secondary | ICD-10-CM | POA: Diagnosis not present

## 2020-08-04 DIAGNOSIS — F17210 Nicotine dependence, cigarettes, uncomplicated: Secondary | ICD-10-CM | POA: Diagnosis not present

## 2020-08-04 DIAGNOSIS — B952 Enterococcus as the cause of diseases classified elsewhere: Secondary | ICD-10-CM | POA: Diagnosis not present

## 2020-08-04 DIAGNOSIS — I255 Ischemic cardiomyopathy: Secondary | ICD-10-CM | POA: Diagnosis not present

## 2020-08-04 DIAGNOSIS — D631 Anemia in chronic kidney disease: Secondary | ICD-10-CM | POA: Diagnosis not present

## 2020-08-04 DIAGNOSIS — Z85038 Personal history of other malignant neoplasm of large intestine: Secondary | ICD-10-CM | POA: Diagnosis not present

## 2020-08-04 DIAGNOSIS — I5040 Unspecified combined systolic (congestive) and diastolic (congestive) heart failure: Secondary | ICD-10-CM | POA: Diagnosis not present

## 2020-08-04 DIAGNOSIS — N390 Urinary tract infection, site not specified: Secondary | ICD-10-CM | POA: Diagnosis not present

## 2020-08-04 DIAGNOSIS — J449 Chronic obstructive pulmonary disease, unspecified: Secondary | ICD-10-CM | POA: Diagnosis not present

## 2020-08-06 ENCOUNTER — Other Ambulatory Visit: Payer: Self-pay

## 2020-08-06 ENCOUNTER — Ambulatory Visit
Admission: RE | Admit: 2020-08-06 | Discharge: 2020-08-06 | Disposition: A | Discharge status: Home / Self Care | Payer: Medicare Other | Source: Ambulatory Visit | Attending: Surgery | Admitting: Surgery

## 2020-08-06 ENCOUNTER — Encounter: Payer: Self-pay | Admitting: *Deleted

## 2020-08-06 DIAGNOSIS — L02211 Cutaneous abscess of abdominal wall: Secondary | ICD-10-CM

## 2020-08-06 HISTORY — PX: IR RADIOLOGIST EVAL & MGMT: IMG5224

## 2020-08-06 NOTE — Progress Notes (Signed)
Referring Physician(s): Cornett,Thomas / Linton Ham   Chief Complaint: The patient is seen in follow up today s/p right anterior pelvic wall abscess drain placed on 12.13.21 by Dr. Earleen Newport  History of present illness: 70 y.o. female outpatient. History of DM, CKD, CAD, CHF, HTN. Ms Jamison was admitted to the hospital for sepsis (enterococcus faecalis) in March 2021 and again in August 2021. During the second admission in August the patient was found to have an abscess in her groin and right buttock s/p debridement on 9.2.21. Ms Jemmott was discharged to a rehabilitation center when she began having swelling in her lower abdomen with abdominal pain, cellulitis and reoccurring drainage from her prior abscess sites. CT Abd Pelvis from 11.29.21 showed a loculated fluid collection in the anterior pelvic wall. IR placed an right abdominal wall abscess drain 12.13.21 with 5 ml of murky colored fluid aspirated. Gram stain from aspiration showed abundant WBC presents. Culture showed no growth. Per note from Dr. Dellia Nims in [place] the previously draining abscesses have since resolved.  Past Medical History:  Diagnosis Date  . Anemia   . COPD (chronic obstructive pulmonary disease) ([place])   . Diabetes mellitus   . Hyperlipidemia   . Hypertension   . Ischemic cardiomyopathy 06/11/2013  . Low back pain   . NSTEMI (non-ST elevated myocardial infarction) ([place]) 06/11/2013  . Obesity   . Personal history of colon cancer   . Renal insufficiency     Past Surgical History:  Procedure Laterality Date  . COLECTOMY    . INCISION AND DRAINAGE PERIRECTAL ABSCESS N/A 03/07/2020   Procedure: IRRIGATION AND DEBRIDEMENT PERINEUM  AND PANNUS ABSCESS;  Surgeon: Erroll Luna, MD;  Location: [facility];  Service: General;  Laterality: N/A;  . LEFT HEART CATHETERIZATION WITH CORONARY ANGIOGRAM N/A 06/09/2013   Procedure: LEFT HEART CATHETERIZATION WITH CORONARY ANGIOGRAM;  Surgeon: Ramond Dial, MD;  Location:  [facility] CATH LAB;  Service: Cardiovascular;  Laterality: N/A;    Allergies: Tradjenta [linagliptin], Atorvastatin, Enalapril maleate, Farxiga [dapagliflozin], Hydrochlorothiazide, Kenalog [triamcinolone acetonide], Levaquin [levofloxacin], Metformin and related, Propoxyphene n-acetaminophen, Simvastatin, and Spironolactone  Medications: Prior to Admission medications   Medication Sig Start Date End Date Taking? Authorizing Provider  amLODipine (NORVASC) 10 MG tablet TAKE ONE-HALF TABLET BY  MOUTH DAILY Patient taking differently: Take 5 mg by mouth daily. 12/19/19   [name], Evie Lacks, MD  aspirin 81 MG chewable tablet Chew 1 tablet (81 mg total) by mouth daily. 06/11/13   Erick Colace, NP  BYSTOLIC 10 MG tablet TAKE 1 TABLET BY MOUTH EVERY DAY 07/30/20   [name], Evie Lacks, MD  calcium carbonate (OS-CAL) 600 MG TABS tablet Take 600 mg by mouth 2 (two) times daily with a meal.    [provider]  Cholecalciferol (VITAMIN D3) 50 MCG (2000 UT) capsule Take 1 capsule (2,000 Units total) by mouth daily. 09/21/19   [name], Evie Lacks, MD  HYDROcodone-acetaminophen (NORCO) 7.5-325 MG tablet Take 0.5-1 tablets by mouth every 6 (six) hours as needed for severe pain. 04/11/20   [name], Evie Lacks, MD  hydrOXYzine (ATARAX/VISTARIL) 25 MG tablet Take 1-2 tablets (25-50 mg total) by mouth every 8 (eight) hours as needed for itching. 12/08/16   [name], Evie Lacks, MD  lansoprazole (PREVACID) 15 MG capsule Take 1 capsule (15 mg total) by mouth daily at 12 noon. Patient taking differently: Take 15 mg by mouth daily as needed (acid reflux). 01/10/19   [name], Evie Lacks, MD  ondansetron (ZOFRAN) 4 MG tablet  Take 1 tablet (4 mg total) by mouth every 8 (eight) hours as needed for nausea or vomiting. 03/28/19   [name], Evie Lacks, MD  ONETOUCH ULTRA test strip USE TO TEST BLOOD SUGAR TWO TIMES A DAY AS DIRECTED 05/01/20   [name], Evie Lacks, MD  potassium chloride SA (KLOR-CON) 20 MEQ tablet Take 1  tablet (20 mEq total) by mouth daily as needed (cramping). 03/14/20   Geradine Girt, DO  rosuvastatin (CRESTOR) 5 MG tablet TAKE 1 TABLET BY MOUTH  DAILY AT 6PM Patient taking differently: Take 5 mg by mouth daily. 02/27/19   [name], Evie Lacks, MD  torsemide (DEMADEX) 20 MG tablet Take 1 tablet (20 mg total) by mouth daily. 03/14/20   Geradine Girt, DO  vitamin B-12 (CYANOCOBALAMIN) 1000 MCG tablet Take 1,000 mcg by mouth daily.    [provider]     Family History  Problem Relation Age of Onset  . Hypertension Other     Social History   Socioeconomic History  . Marital status: Single    Spouse name: Not on file  . Number of children: Not on file  . Years of education: Not on file  . Highest education level: Not on file  Occupational History  . Not on file  Tobacco Use  . Smoking status: Light Tobacco Smoker    Types: Cigarettes  . Smokeless tobacco: Never Used  Vaping Use  . Vaping Use: Never used  Substance and Sexual Activity  . Alcohol use: No  . Drug use: No  . Sexual activity: Not Currently    Birth control/protection: Post-menopausal  Other Topics Concern  . Not on file  Social History Narrative  . Not on file   Social Determinants of Health   Financial Resource Strain: Not on file  Food Insecurity: Not on file  Transportation Needs: Not on file  Physical Activity: Not on file  Stress: Not on file  Social Connections: Not on file     Vital Signs: LMP  (LMP Unknown)   Physical Exam Vitals and nursing note reviewed.  Constitutional:      Appearance: She is well-developed.  HENT:     Head: Normocephalic and atraumatic.  Eyes:     Conjunctiva/sclera: Conjunctivae normal.  Pulmonary:     Effort: Pulmonary effort is normal.  Abdominal:     Comments: Positive RLQ drain to suction. Site is unremarkable with no erythema, edema, tenderness, bleeding or drainage noted at exit site. Suture in place. Dressing is clean dry and intact. 0.5 ml of   pink tinged colored fluid noted in the bulb suction device.  Musculoskeletal:        General: Normal range of motion.     Cervical back: Normal range of motion.  Skin:    General: Skin is warm.  Neurological:     Mental Status: She is alert and oriented to person, place, and time.     Imaging: CT ABDOMEN PELVIS WO CONTRAST  Result Date: 08/06/2020 CLINICAL DATA:  70 year old female with history of abdominal wall abscess status post percutaneous drain placement on 06/17/2020. Presents for follow-up. EXAM: CT ABDOMEN AND PELVIS WITHOUT CONTRAST TECHNIQUE: Multidetector CT imaging of the abdomen and pelvis was performed following the standard protocol without IV contrast. COMPARISON:  06/03/2020, 06/17/2020 FINDINGS: Lower chest: No acute abnormality. Hepatobiliary: No focal liver abnormality is seen. No gallstones, gallbladder wall thickening, or biliary dilatation. Pancreas: Unremarkable. No pancreatic ductal dilatation or surrounding inflammatory changes. Spleen: Normal in size without focal  abnormality. Adrenals/Urinary Tract: Adrenal glands are unremarkable. Kidneys are normal, without renal calculi, focal lesion, or hydronephrosis. Bladder is unremarkable. Stomach/Bowel: Stomach is within normal limits. Postsurgical changes after right hemicolectomy without complicating features. Scattered colonic diverticula without surrounding inflammatory changes. No evidence of bowel wall thickening, distention, or inflammatory changes. Vascular/Lymphatic: Similar appearing scattered abdominal aortic atherosclerotic calcifications. No enlarged abdominal or pelvic lymph nodes. Reproductive: Uterus and bilateral adnexa are unremarkable. Other: Interval decreased size of previously visualized right lower anterior abdominal wall fluid collection with unchanged position of indwelling drain. The collection is now immeasurable. There is persistent surrounding fat stranding and anterior lower abdominal wall skin  thickening, suggestive of persistent inflammatory process. Musculoskeletal: No acute osseous abnormality. Multilevel degenerative changes of the thoracolumbar spine, most pronounced at L2-L3 and L5-S1. IMPRESSION: 1. Interval resolution of previously visualized right anterior lower abdominal extraperitoneal fluid collection with unchanged position of indwelling pigtail drain. 2. No acute intra-abdominal abnormality. 3.  Aortic Atherosclerosis (ICD10-I70.0). PLAN: IR to remove indwelling percutaneous drain.  Follow-up as needed. Ruthann Cancer, MD [facility] Electronically Signed   By: Ruthann Cancer MD   On: 08/06/2020 12:45    Labs:  CBC: Recent Labs    03/10/20 0317 03/11/20 0357 03/11/20 2216 03/12/20 1530 03/13/20 1711  WBC 19.3* 17.0* 13.9*  --  13.3*  HGB 7.3* 7.2* 6.6* 8.9* 8.7*  HCT 23.2* 23.7* 21.9* 27.9* 27.8*  PLT 180 181 184  --  242    COAGS: Recent Labs    06/17/20 0920  INR 1.1    BMP: Recent Labs    03/10/20 0317 03/11/20 0357 03/11/20 2216 03/13/20 1711  NA 139 139 139 137  K 4.0 3.7 3.7 3.6  CL 109 109 108 106  CO2 21* 24 22 21*  GLUCOSE 129* 132* 136* 192*  BUN 63* 55* 50* 25*  CALCIUM 8.4* 8.2* 8.0* 8.0*  CREATININE 1.81* 1.80* 1.78* 1.54*  GFRNONAA 28* 28* 29* 34*  GFRAA 32* 33* 33* 39*    Assessment:  70 y.o. female outpatient. History of DM, CKD, CAD, CHF, HTN. Ms Kunkler was admitted to the hospital for sepsis (enterococcus faecalis) in March 2021 and again in August 2021. During the second admission in August the patient was found to have an abscess in her groin and right buttock s/p debridement on 9.2.2. Ms Dutan was discharged to a rehabilitation center when she began having swelling in her lower abdomen with abdominal pain, cellulitis and reoccurring drainage from her prior abscess sites. CT Abd Pelvis from 11.29.21 showed a loculated fluid collection in the anterior pelvic wall. IR  placed an right abdominal wall abscess drain 12.13.21 with 5 ml of murky colored fluid aspirated. Gram stain from aspiration showed abundant WBC presents. Culture showed no growth. Per note from Dr. Dellia Nims in [place] the previously draining abscesses have since resolved. Patient seen in Interventional Radiology drain clinic. Currently without any significant complaints. Patient alert  calm and comfortable. Denies any fevers, abdominal pain, nausea or vomiting.  Output is approximately one teaspoon per day for the last 2 weeks. Flushing performed by home health. CT abd pelvis shows the prior right anterior pelvic wall has been resolved. Discussed case with Dr. Serafina Royals who recommends drain removal at this time.  Drain removed intact, no complications,  patient tolerated procedure well, dressing applied to exit site.  Post-removal instructions: 1- Okay  to shower/sponge bath 24 hours post-removal. 2- No submerging (swimming, bathing) for 7 days post-removal. 3-  Change dressing PRN until site fully healed.  Thank you for this interesting consult.  I greatly enjoyed meeting Jobe Marker  Signed: Jacqualine Mau, NP 08/06/2020, 12:50 PM   Please refer to Dr. Malachi Carl attestation of this note for management and plan.

## 2020-08-08 DIAGNOSIS — F17210 Nicotine dependence, cigarettes, uncomplicated: Secondary | ICD-10-CM | POA: Diagnosis not present

## 2020-08-08 DIAGNOSIS — D631 Anemia in chronic kidney disease: Secondary | ICD-10-CM | POA: Diagnosis not present

## 2020-08-08 DIAGNOSIS — D509 Iron deficiency anemia, unspecified: Secondary | ICD-10-CM | POA: Diagnosis not present

## 2020-08-08 DIAGNOSIS — K219 Gastro-esophageal reflux disease without esophagitis: Secondary | ICD-10-CM | POA: Diagnosis not present

## 2020-08-08 DIAGNOSIS — L03314 Cellulitis of groin: Secondary | ICD-10-CM | POA: Diagnosis not present

## 2020-08-08 DIAGNOSIS — I130 Hypertensive heart and chronic kidney disease with heart failure and stage 1 through stage 4 chronic kidney disease, or unspecified chronic kidney disease: Secondary | ICD-10-CM | POA: Diagnosis not present

## 2020-08-08 DIAGNOSIS — Z7982 Long term (current) use of aspirin: Secondary | ICD-10-CM | POA: Diagnosis not present

## 2020-08-08 DIAGNOSIS — E785 Hyperlipidemia, unspecified: Secondary | ICD-10-CM | POA: Diagnosis not present

## 2020-08-08 DIAGNOSIS — I255 Ischemic cardiomyopathy: Secondary | ICD-10-CM | POA: Diagnosis not present

## 2020-08-08 DIAGNOSIS — I2510 Atherosclerotic heart disease of native coronary artery without angina pectoris: Secondary | ICD-10-CM | POA: Diagnosis not present

## 2020-08-08 DIAGNOSIS — Z85038 Personal history of other malignant neoplasm of large intestine: Secondary | ICD-10-CM | POA: Diagnosis not present

## 2020-08-08 DIAGNOSIS — I5040 Unspecified combined systolic (congestive) and diastolic (congestive) heart failure: Secondary | ICD-10-CM | POA: Diagnosis not present

## 2020-08-08 DIAGNOSIS — E1122 Type 2 diabetes mellitus with diabetic chronic kidney disease: Secondary | ICD-10-CM | POA: Diagnosis not present

## 2020-08-08 DIAGNOSIS — B952 Enterococcus as the cause of diseases classified elsewhere: Secondary | ICD-10-CM | POA: Diagnosis not present

## 2020-08-08 DIAGNOSIS — Z8673 Personal history of transient ischemic attack (TIA), and cerebral infarction without residual deficits: Secondary | ICD-10-CM | POA: Diagnosis not present

## 2020-08-08 DIAGNOSIS — I252 Old myocardial infarction: Secondary | ICD-10-CM | POA: Diagnosis not present

## 2020-08-08 DIAGNOSIS — N184 Chronic kidney disease, stage 4 (severe): Secondary | ICD-10-CM | POA: Diagnosis not present

## 2020-08-08 DIAGNOSIS — J449 Chronic obstructive pulmonary disease, unspecified: Secondary | ICD-10-CM | POA: Diagnosis not present

## 2020-08-08 DIAGNOSIS — N390 Urinary tract infection, site not specified: Secondary | ICD-10-CM | POA: Diagnosis not present

## 2020-08-08 DIAGNOSIS — L02214 Cutaneous abscess of groin: Secondary | ICD-10-CM | POA: Diagnosis not present

## 2020-08-09 DIAGNOSIS — N189 Chronic kidney disease, unspecified: Secondary | ICD-10-CM | POA: Diagnosis not present

## 2020-08-09 DIAGNOSIS — N2581 Secondary hyperparathyroidism of renal origin: Secondary | ICD-10-CM | POA: Diagnosis not present

## 2020-08-09 DIAGNOSIS — J449 Chronic obstructive pulmonary disease, unspecified: Secondary | ICD-10-CM | POA: Diagnosis not present

## 2020-08-09 DIAGNOSIS — I129 Hypertensive chronic kidney disease with stage 1 through stage 4 chronic kidney disease, or unspecified chronic kidney disease: Secondary | ICD-10-CM | POA: Diagnosis not present

## 2020-08-09 DIAGNOSIS — D631 Anemia in chronic kidney disease: Secondary | ICD-10-CM | POA: Diagnosis not present

## 2020-08-09 DIAGNOSIS — E1122 Type 2 diabetes mellitus with diabetic chronic kidney disease: Secondary | ICD-10-CM | POA: Diagnosis not present

## 2020-08-09 DIAGNOSIS — N184 Chronic kidney disease, stage 4 (severe): Secondary | ICD-10-CM | POA: Diagnosis not present

## 2020-08-14 DIAGNOSIS — D509 Iron deficiency anemia, unspecified: Secondary | ICD-10-CM | POA: Diagnosis not present

## 2020-08-14 DIAGNOSIS — Z85038 Personal history of other malignant neoplasm of large intestine: Secondary | ICD-10-CM | POA: Diagnosis not present

## 2020-08-14 DIAGNOSIS — N184 Chronic kidney disease, stage 4 (severe): Secondary | ICD-10-CM | POA: Diagnosis not present

## 2020-08-14 DIAGNOSIS — K219 Gastro-esophageal reflux disease without esophagitis: Secondary | ICD-10-CM | POA: Diagnosis not present

## 2020-08-14 DIAGNOSIS — E1122 Type 2 diabetes mellitus with diabetic chronic kidney disease: Secondary | ICD-10-CM | POA: Diagnosis not present

## 2020-08-14 DIAGNOSIS — I130 Hypertensive heart and chronic kidney disease with heart failure and stage 1 through stage 4 chronic kidney disease, or unspecified chronic kidney disease: Secondary | ICD-10-CM | POA: Diagnosis not present

## 2020-08-14 DIAGNOSIS — I2510 Atherosclerotic heart disease of native coronary artery without angina pectoris: Secondary | ICD-10-CM | POA: Diagnosis not present

## 2020-08-14 DIAGNOSIS — I255 Ischemic cardiomyopathy: Secondary | ICD-10-CM | POA: Diagnosis not present

## 2020-08-14 DIAGNOSIS — L03314 Cellulitis of groin: Secondary | ICD-10-CM | POA: Diagnosis not present

## 2020-08-14 DIAGNOSIS — F17210 Nicotine dependence, cigarettes, uncomplicated: Secondary | ICD-10-CM | POA: Diagnosis not present

## 2020-08-14 DIAGNOSIS — I5040 Unspecified combined systolic (congestive) and diastolic (congestive) heart failure: Secondary | ICD-10-CM | POA: Diagnosis not present

## 2020-08-14 DIAGNOSIS — E785 Hyperlipidemia, unspecified: Secondary | ICD-10-CM | POA: Diagnosis not present

## 2020-08-14 DIAGNOSIS — B952 Enterococcus as the cause of diseases classified elsewhere: Secondary | ICD-10-CM | POA: Diagnosis not present

## 2020-08-14 DIAGNOSIS — D631 Anemia in chronic kidney disease: Secondary | ICD-10-CM | POA: Diagnosis not present

## 2020-08-14 DIAGNOSIS — L02214 Cutaneous abscess of groin: Secondary | ICD-10-CM | POA: Diagnosis not present

## 2020-08-14 DIAGNOSIS — J449 Chronic obstructive pulmonary disease, unspecified: Secondary | ICD-10-CM | POA: Diagnosis not present

## 2020-08-14 DIAGNOSIS — Z8673 Personal history of transient ischemic attack (TIA), and cerebral infarction without residual deficits: Secondary | ICD-10-CM | POA: Diagnosis not present

## 2020-08-14 DIAGNOSIS — N390 Urinary tract infection, site not specified: Secondary | ICD-10-CM | POA: Diagnosis not present

## 2020-08-14 DIAGNOSIS — Z7982 Long term (current) use of aspirin: Secondary | ICD-10-CM | POA: Diagnosis not present

## 2020-08-14 DIAGNOSIS — I252 Old myocardial infarction: Secondary | ICD-10-CM | POA: Diagnosis not present

## 2020-08-27 ENCOUNTER — Other Ambulatory Visit: Payer: Self-pay

## 2020-08-29 ENCOUNTER — Ambulatory Visit (INDEPENDENT_AMBULATORY_CARE_PROVIDER_SITE_OTHER): Payer: Medicare Other | Admitting: Internal Medicine

## 2020-08-29 ENCOUNTER — Other Ambulatory Visit: Payer: Self-pay

## 2020-08-29 ENCOUNTER — Encounter: Payer: Self-pay | Admitting: Internal Medicine

## 2020-08-29 VITALS — BP 120/70 | HR 83 | Ht 62.0 in | Wt 182.8 lb

## 2020-08-29 DIAGNOSIS — E049 Nontoxic goiter, unspecified: Secondary | ICD-10-CM

## 2020-08-29 DIAGNOSIS — E1165 Type 2 diabetes mellitus with hyperglycemia: Secondary | ICD-10-CM

## 2020-08-29 DIAGNOSIS — E1159 Type 2 diabetes mellitus with other circulatory complications: Secondary | ICD-10-CM | POA: Diagnosis not present

## 2020-08-29 DIAGNOSIS — E785 Hyperlipidemia, unspecified: Secondary | ICD-10-CM | POA: Diagnosis not present

## 2020-08-29 LAB — POCT GLYCOSYLATED HEMOGLOBIN (HGB A1C): Hemoglobin A1C: 7 % — AB (ref 4.0–5.6)

## 2020-08-29 MED ORDER — GLIPIZIDE ER 5 MG PO TB24: 5.0000 mg | ORAL_TABLET | Freq: Every day | ORAL | 3 refills | Status: DC

## 2020-08-29 NOTE — Progress Notes (Signed)
Patient ID: [name], female   DOB: [birth year], 70 y.o.   MRN: [id number]  This visit occurred during the SARS-CoV-2 public health emergency.  Safety protocols were in place, including screening questions prior to the visit, additional usage of staff PPE, and extensive cleaning of exam room while observing appropriate contact time as indicated for disinfecting solutions.   HPI: [name] is a 70 y.o.-year-old female, returning for f/u DM2, dx in 2006, insulin-independent, uncontrolled, with complications (CAD - s/p AMI 06/2013, CHF; CKD; PN; DR). Last visit 3.5 months ago.  Before last visit, she improved her diet switching to a more plant-based diet and eliminated sweet drinks, ice cream, cakes.  Sugars improved significantly.  However, she came off the diet before last visit.  Sugars were higher.  Since then, she added more fruit and veggies since last visit.  She had a new abdominal surgery recently to drain the rest of her abscesses.  Reviewed HbA1c levels: 05/16/2020: HbA1c calculated from fructosamine is: 7.4%, lower than the directly measured HbA1c. Lab Results  Component Value Date   HGBA1C 8.3 (A) 05/16/2020   HGBA1C 7.2 (A) 01/10/2020   HGBA1C 7.3 (A) 09/06/2019  05/02/2019: HbA1c calculated from fructosamine was 7.0% 12/30/2018: HbA1c calculated from fructosamine is higher, at 8.2%, possibly 2/2 drinking juice 08/24/2018: HbA1c calculated from fructosamine is higher, at 8%, possibly 2/2 ABx  04/20/2018: HbA1c calculated from fructosamine is slightly better than before, at 7% 12/15/2017: HbA1c calculated from fructosamine: 7.17%, slightly improved from last visit  08/17/2017: HbA1c calculated from fructosamine: 7.2%, slightly improved 04/15/2017: HbA1c calculated from the fructosamine is lower than measured, at 7.3% 08/21/2016: HbA1c 6.0%. She started to change her diet after her hemoglobin A1c returned high, at 16.2% in 09/2014. She cut down portions,  changed the meal content to include more fiber and less carbs.  HbA1c decreased dramatically.  Pt is on: - Glipizide ER 5 mg in a.m.-started 2016 >> 10 mg before breakfast She stopped metformin ER in 01/2017. She tried Tradjenta 5 mg daily in am >> CP with it (started 10/2014) >> had to stop She tried [place] >> decreased [name] and yeast inf. She tried regular metformin >> diarrhea She refused insulin in the past.  Pt checks her sugars twice a day per review of her log: - am: 95-128 >> 97-129, 155 (spaghetti) >> 116-140, 154 >> 98-136, 142 - 2h after b'fast: 108-130 >> 122-139 >> 129-144 >> 130-140 - before lunch: 143 >> 112 >> 122, 138 >> 125 >> 89, 121-151 - 2h after lunch: 169 >> 120-130 >> n/c - before dinner:  110-144 >> 118-130 >> n/c - 2h after dinner: 120-148 >> 122-141, 170 >> 128-176 >> 119-147, 155 - bedtime: 137 >> n/c >> 147-157 >> n/c - nighttime: n/c Lowest sugar was  68 >> 66 ; it is unclear at which level she has hypoglycemia awareness. Highest sugar was 169 >> 156 >> 176.  Glucometer: One Touch Ultra  Pt's meals are: - Breakfast: oatmeal, cereals, [place] bacon, egg toast; cinnamon on oatmeal or cheerios - Lunch: sandwich or fruit salad - Dinner: salmon/chicken, Brussel sprouts, other veggies, sweet potatoes or brown rice, spaghetti - Snacks: 2: carrots with dip; yoghurt with fruit, veggies + dip; fruit She walks for exercise. She saw Dr. Hollie Salk with nephrology.  She was started on a high potassium, low protein diet  -+ CKD-sees nephrology; latest BUN/creatinine: 08/09/2020: 68/2.15, [name] 26, glucose 145, protein to creatinine ratio 68, ACR 9 Lab Results  Component  Value Date   BUN 25 (H) 03/13/2020   CREATININE 1.54 (H) 03/13/2020  She stopped olmesartan due to worsening kidney function and cough -+ HL; last set of lipids: Lab Results  Component Value Date   CHOL 119 01/10/2020   HDL 48.50 01/10/2020   LDLCALC 56 01/10/2020   TRIG 73.0 01/10/2020   CHOLHDL 2  01/10/2020  On Crestor 5 daily - last eye exam was in 2021: + DR, + cataract reportedly.  Had laser surgery. Dr. Posey Pronto. - she has Numbness and tingling in her feet.  She also has a history of HTN, GERD, and anemia. In 02/2020, she was admitted multiple perineal abscesses and cellulitis.  These were I&D and she was started on antibiotics. She was in [facility] for 2 weeks - she did not like her care there, including the meals. Of note, patient discussed with nephrology about dialysis and she would never want to proceed with this.  She has a history of goiter per latest thyroid ultrasound from 2016 showed only small cysts.  Pt denies: - feeling nodules in neck - hoarseness - dysphagia - choking - SOB with lying down  Latest TSH normal: Lab Results  Component Value Date   TSH 2.17 01/10/2020   ROS: Constitutional: no weight gain/no weight loss, no fatigue, no subjective hyperthermia, no subjective hypothermia Eyes: no blurry vision, no xerophthalmia ENT: no sore throat, + see HPI Cardiovascular: no CP/no SOB/no palpitations/no leg swelling Respiratory: no cough/no SOB/no wheezing Gastrointestinal: no N/no V/no D/no C/no acid reflux Musculoskeletal: no muscle aches/no joint aches Skin: no rashes, no hair loss Neurological: no tremors/+ numbness/+ tingling/no dizziness  I reviewed pt's medications, allergies, PMH, social hx, family hx, and changes were documented in the history of present illness. Otherwise, unchanged from my initial visit note.  Past Medical History:  Diagnosis Date  . Anemia   . COPD (chronic obstructive pulmonary disease) ([place])   . Diabetes mellitus   . Hyperlipidemia   . Hypertension   . Ischemic cardiomyopathy 06/11/2013  . Low back pain   . NSTEMI (non-ST elevated myocardial infarction) ([place]) 06/11/2013  . Obesity   . Personal history of colon cancer   . Renal insufficiency    Past Surgical History:  Procedure Laterality Date  . COLECTOMY    .  INCISION AND DRAINAGE PERIRECTAL ABSCESS N/A 03/07/2020   Procedure: IRRIGATION AND DEBRIDEMENT PERINEUM  AND PANNUS ABSCESS;  Surgeon: Erroll Luna, MD;  Location: [facility];  Service: General;  Laterality: N/A;  . IR RADIOLOGIST EVAL & MGMT  08/06/2020  . LEFT HEART CATHETERIZATION WITH CORONARY ANGIOGRAM N/A 06/09/2013   Procedure: LEFT HEART CATHETERIZATION WITH CORONARY ANGIOGRAM;  Surgeon: Ramond Dial, MD;  Location: [facility] CATH LAB;  Service: Cardiovascular;  Laterality: N/A;   History   Social History  . Marital Status: Single    Spouse Name: N/A  . Number of Children: 0   Occupational History  . retired   Social History Main Topics  . Smoking status:  former smoker     Types: Cigarettes  . Smokeless tobacco: Never Used  . Alcohol Use: No  . Drug Use: No   Current Outpatient Medications on File Prior to Visit  Medication Sig Dispense Refill  . amLODipine (NORVASC) 10 MG tablet TAKE ONE-HALF TABLET BY  MOUTH DAILY (Patient taking differently: Take 5 mg by mouth daily.) 45 tablet 3  . aspirin 81 MG chewable tablet Chew 1 tablet (81 mg total) by mouth daily.    [name]  BYSTOLIC 10 MG tablet TAKE 1 TABLET BY MOUTH EVERY DAY 90 tablet 3  . calcium carbonate (OS-CAL) 600 MG TABS tablet Take 600 mg by mouth 2 (two) times daily with a meal.    . Cholecalciferol (VITAMIN D3) 50 MCG (2000 UT) capsule Take 1 capsule (2,000 Units total) by mouth daily. 100 capsule 3  . HYDROcodone-acetaminophen (NORCO) 7.5-325 MG tablet Take 0.5-1 tablets by mouth every 6 (six) hours as needed for severe pain. 20 tablet 0  . hydrOXYzine (ATARAX/VISTARIL) 25 MG tablet Take 1-2 tablets (25-50 mg total) by mouth every 8 (eight) hours as needed for itching. 60 tablet 0  . lansoprazole (PREVACID) 15 MG capsule Take 1 capsule (15 mg total) by mouth daily at 12 noon. (Patient taking differently: Take 15 mg by mouth daily as needed (acid reflux).) 90 capsule 3  . ondansetron (ZOFRAN) 4 MG tablet Take 1 tablet (4 mg  total) by mouth every 8 (eight) hours as needed for nausea or vomiting. 20 tablet 0  . ONETOUCH ULTRA test strip USE TO TEST BLOOD SUGAR TWO TIMES A DAY AS DIRECTED 200 strip 3  . potassium chloride SA (KLOR-CON) 20 MEQ tablet Take 1 tablet (20 mEq total) by mouth daily as needed (cramping).    . rosuvastatin (CRESTOR) 5 MG tablet TAKE 1 TABLET BY MOUTH  DAILY AT 6PM (Patient taking differently: Take 5 mg by mouth daily.) 90 tablet 3  . torsemide (DEMADEX) 20 MG tablet Take 1 tablet (20 mg total) by mouth daily.    . vitamin B-12 (CYANOCOBALAMIN) 1000 MCG tablet Take 1,000 mcg by mouth daily.     No current facility-administered medications on file prior to visit.   Allergies  Allergen Reactions  . Tradjenta [Linagliptin] Anaphylaxis    CP  . Atorvastatin     REACTION: aches and pains  . Enalapril Maleate     REACTION: cough  . Farxiga [Dapagliflozin] Itching  . Hydrochlorothiazide     REACTION: hair loss  . Kenalog [Triamcinolone Acetonide]     HANDS NUMB   . Levaquin [Levofloxacin] Hives  . Metformin And Related     Diarrhea, dizziness  . Propoxyphene N-Acetaminophen Hives  . Simvastatin     REACTION: cramps  . Spironolactone     REACTION: cramps   Family History  Problem Relation Age of Onset  . Hypertension Other    PE: BP 120/70   Pulse 83   Ht 5\' 2"  (1.575 m)   Wt 182 lb 12.8 oz (82.9 kg)   LMP  (LMP Unknown)   SpO2 94%   BMI 33.43 kg/m  Body mass index is 33.43 kg/m. Wt Readings from Last 3 Encounters:  08/29/20 182 lb 12.8 oz (82.9 kg)  07/11/20 184 lb 3.2 oz (83.6 kg)  05/16/20 179 lb 3.2 oz (81.3 kg)   Constitutional: overweight, in NAD Eyes: PERRLA, EOMI, no exophthalmos ENT: moist mucous membranes, no thyromegaly, no cervical lymphadenopathy Cardiovascular: RRR, No RG, + 1/6 SEM, + L>R LE edema Respiratory: CTA B Gastrointestinal: abdomen soft, NT, ND, BS+ Musculoskeletal: no deformities, strength intact in all 4 Skin: moist, warm, no  rashes Neurological: no tremor with outstretched hands, DTR normal in all 4   ASSESSMENT: 1. DM2, insulin-independent, uncontrolled, with complications - CAD, s/p AMI 06/2013 - Dr Acie Fredrickson - CHF - CKD - PN - DR - Dr. Jalene Mullet ([facility]) - on IO injections >> improving  2. Goiter - No neck compression symptoms  - 10/19/2014: thyroid ultrasound: Right thyroid  lobe: 4.0 x 1.5 x 1.6 cm. Heterogeneous parenchyma with multiple small cysts identified. The largest measures approximately 0.5 x 0.3 x 0.5 cm. All of the small cysts in the right lobe appears simple and benign.  Left thyroid lobe: 4.5 x 1.4 x 2.0 cm. Heterogeneous parenchyma with multiple cysts. The largest measures approximately 1.0 x 0.4 x 0.5 cm. 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 Similar smaller cyst measures 0.8 cm in greatest diameter. There is a small solid nodule measuring 0.7 x 0.4 x 0.6 cm.  Isthmus Thickness: 0.4 cm. No nodules visualized.  Lymphadenopathy: None visualized.          Multicystic thyroid.  3. HL  PLAN:  1. Patient with longstanding, uncontrolled, type 2 diabetes, on glipizide ER only with initially improving control after improvement of her diet and switching to a more plant-based diet with  reduced processed foods and sweets.  However, she relaxed her diet before last visit and sugars increased.  HbA1c was 8.3%, higher.  She returned at that time after hospitalization for perineal abscesses and sepsis.  Since sugars are high, I advised her to increase glipizide for the duration of the hyperglycemia and then start to back off.  A fructosamine level yielded a calculated HbA1c of 7.4%. -Of note, she still refuses insulin -At this visit, sugars at home are mostly at goal with very few mildly hyperglycemic excursions.  She can usually trace these to dietary changes that she is correcting.  She tells me that she backed off the glipizide dose from 10 mg  to 5 mg since last visit as sugars started to improve especially after abdominal surgery.  We will not change her regimen for now. -I advised her to: Patient Instructions  Please continue: - Glipizide ER 5 mg before breakfast   Please restart: - Crestor 5 mg every other day  Please return in 4 months with your sugar log.  - we checked her HbA1c: 7.0% (much better) - advised to check sugars at different times of the day - 1x a day, rotating check times - advised for yearly eye exams >> she is UTD - return to clinic in 3-4 months   2. Goiter -She denies neck compression symptoms -She only has small thyroid cysts per review of the latest thyroid ultrasound report -Latest TSH from 01/2020 was normal -No follow-up is necessary unless she develops neck compression symptoms  3. HL -Reviewed latest lipid panel from last summer: All fractions at goal: Lab Results  Component Value Date   CHOL 119 01/10/2020   HDL 48.50 01/10/2020   LDLCALC 56 01/10/2020   TRIG 73.0 01/10/2020   CHOLHDL 2 01/10/2020  -off Crestor 5, which she was taking every day without side effects.  Upon questioning, she was not given the statin at [facility] and she did not restart it afterwards.  I advised her to restart it now.  Philemon Kingdom, MD PhD [facility] Endocrinology

## 2020-08-29 NOTE — Patient Instructions (Addendum)
Please continue: - Glipizide ER 5 mg before breakfast   Please restart: - Crestor 5 mg every other day  Please return in 4 months with your sugar log.

## 2020-09-18 ENCOUNTER — Encounter: Payer: Self-pay | Admitting: Cardiovascular Disease

## 2020-09-18 NOTE — Progress Notes (Signed)
    no show  This encounter was created in error - please disregard.

## 2020-09-19 ENCOUNTER — Telehealth: Payer: Self-pay | Admitting: Internal Medicine

## 2020-09-19 NOTE — Telephone Encounter (Signed)
MD is out of the office, and before any other provider rx something pt will need ov. Pt decline appt. Will forward to MD for him to respond once he return.Marland KitchenJohny [name]

## 2020-09-19 NOTE — Telephone Encounter (Signed)
Patient called and said that her neuropathy has been acting up and said this morning she is having trouble walking. She said when she was in the nursing home they prescribed her something for it and was wondering if it could be sent in again. She did not know the name of the medication. Declined appt at this time. She can be reached at [phone]. Please advise

## 2020-09-20 ENCOUNTER — Encounter: Payer: Medicare Other | Admitting: Cardiovascular Disease

## 2020-09-20 NOTE — Telephone Encounter (Signed)
Follow up  Patient declined appointment, states too painful to walk. Requesting call from [facility]

## 2020-09-22 MED ORDER — GABAPENTIN 100 MG PO CAPS: 100.0000 mg | ORAL_CAPSULE | Freq: Three times a day (TID) | ORAL | 3 refills | Status: DC

## 2020-09-22 NOTE — Telephone Encounter (Signed)
I have sent a prescription for gabapentin to her pharmacy.  Start with 1 at night.  Can increase to 1 capsule 3 times a day See me in 4-6 weeks. Thanks

## 2020-09-23 NOTE — Telephone Encounter (Signed)
Notified pt w/MD response.../lmb 

## 2020-09-27 DIAGNOSIS — H35033 Hypertensive retinopathy, bilateral: Secondary | ICD-10-CM | POA: Diagnosis not present

## 2020-09-27 DIAGNOSIS — E113513 Type 2 diabetes mellitus with proliferative diabetic retinopathy with macular edema, bilateral: Secondary | ICD-10-CM | POA: Diagnosis not present

## 2020-09-27 DIAGNOSIS — H3582 Retinal ischemia: Secondary | ICD-10-CM | POA: Diagnosis not present

## 2020-09-27 DIAGNOSIS — H31093 Other chorioretinal scars, bilateral: Secondary | ICD-10-CM | POA: Diagnosis not present

## 2020-10-02 DIAGNOSIS — E113513 Type 2 diabetes mellitus with proliferative diabetic retinopathy with macular edema, bilateral: Secondary | ICD-10-CM | POA: Diagnosis not present

## 2020-10-02 DIAGNOSIS — H43813 Vitreous degeneration, bilateral: Secondary | ICD-10-CM | POA: Diagnosis not present

## 2020-10-02 DIAGNOSIS — H3582 Retinal ischemia: Secondary | ICD-10-CM | POA: Diagnosis not present

## 2020-10-02 DIAGNOSIS — H35033 Hypertensive retinopathy, bilateral: Secondary | ICD-10-CM | POA: Diagnosis not present

## 2020-10-08 ENCOUNTER — Other Ambulatory Visit: Payer: Self-pay

## 2020-10-09 ENCOUNTER — Encounter: Payer: Self-pay | Admitting: Internal Medicine

## 2020-10-09 ENCOUNTER — Ambulatory Visit (INDEPENDENT_AMBULATORY_CARE_PROVIDER_SITE_OTHER): Payer: Medicare Other | Admitting: Internal Medicine

## 2020-10-09 DIAGNOSIS — I2510 Atherosclerotic heart disease of native coronary artery without angina pectoris: Secondary | ICD-10-CM

## 2020-10-09 DIAGNOSIS — E1165 Type 2 diabetes mellitus with hyperglycemia: Secondary | ICD-10-CM | POA: Diagnosis not present

## 2020-10-09 DIAGNOSIS — N184 Chronic kidney disease, stage 4 (severe): Secondary | ICD-10-CM

## 2020-10-09 DIAGNOSIS — I700 Atherosclerosis of aorta: Secondary | ICD-10-CM

## 2020-10-09 DIAGNOSIS — E1159 Type 2 diabetes mellitus with other circulatory complications: Secondary | ICD-10-CM | POA: Diagnosis not present

## 2020-10-09 DIAGNOSIS — E1122 Type 2 diabetes mellitus with diabetic chronic kidney disease: Secondary | ICD-10-CM | POA: Diagnosis not present

## 2020-10-09 DIAGNOSIS — E785 Hyperlipidemia, unspecified: Secondary | ICD-10-CM | POA: Diagnosis not present

## 2020-10-09 DIAGNOSIS — D509 Iron deficiency anemia, unspecified: Secondary | ICD-10-CM | POA: Diagnosis not present

## 2020-10-09 NOTE — Progress Notes (Signed)
Subjective:  Patient ID: [name], female    DOB: June 25, 1951  Age: 70 y.o. 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 amLODipine (NORVASC) 10 MG tablet TAKE ONE-HALF TABLET BY  MOUTH DAILY (Patient taking differently: Take 5 mg by mouth daily.) 45 tablet 3  . aspirin 81 MG chewable tablet Chew 1 tablet (81 mg total) by mouth daily.    [name] BYSTOLIC 10 MG tablet TAKE 1 TABLET BY MOUTH EVERY DAY 90 tablet 3  . calcium carbonate (OS-CAL) 600 MG TABS tablet Take 600 mg by mouth 2 (two) times daily with a meal.    . Cholecalciferol (VITAMIN D3) 50 MCG (2000 UT) capsule Take 1 capsule (2,000 Units total) by mouth daily. 100 capsule 3  . gabapentin (NEURONTIN) 100 MG capsule Take 1 capsule (100 mg total) by mouth 3 (three) times daily. 90 capsule 3  . glipiZIDE (GLUCOTROL XL) 5 MG 24 hr tablet Take 1 tablet (5 mg total) by mouth daily with breakfast. 90 tablet 3  . HYDROcodone-acetaminophen (NORCO) 7.5-325 MG tablet Take 0.5-1 tablets by mouth every 6 (six) hours as needed for severe pain. 20 tablet 0  . hydrOXYzine (ATARAX/VISTARIL) 25 MG tablet Take 1-2 tablets (25-50 mg total) by mouth every 8 (eight) hours as needed for itching. 60 tablet 0  . Iron-Vitamin C (IRON 100/C PO) Take by mouth.    . lansoprazole (PREVACID) 15 MG capsule Take 1 capsule (15 mg total) by mouth daily at 12 noon. (Patient taking differently: Take 15 mg by mouth daily as needed (acid reflux).) 90 capsule 3  . ondansetron (ZOFRAN) 4 MG tablet Take 1 tablet (4 mg total) by mouth every 8 (eight) hours as needed for nausea or vomiting. 20 tablet 0  . ONETOUCH ULTRA test strip USE TO TEST BLOOD SUGAR TWO TIMES A DAY AS DIRECTED 200 strip 3  . potassium chloride SA (KLOR-CON) 20 MEQ tablet Take 1 tablet (20 mEq total) by mouth daily as needed (cramping).    .  rosuvastatin (CRESTOR) 5 MG tablet TAKE 1 TABLET BY MOUTH  DAILY AT 6PM (Patient taking differently: Take 5 mg by mouth daily.) 90 tablet 3  . torsemide (DEMADEX) 20 MG tablet Take 1 tablet (20 mg total) by mouth daily.    . vitamin B-12 (CYANOCOBALAMIN) 1000 MCG tablet Take 1,000 mcg by mouth daily.     No facility-administered medications prior to visit.    ROS: Review of Systems  Constitutional: Negative for activity change, appetite change, chills, fatigue and unexpected weight change.  HENT: Negative for congestion, mouth sores and sinus pressure.   Eyes: Negative for visual disturbance.  Respiratory: Negative for cough and chest tightness.   Gastrointestinal: Negative for abdominal pain and nausea.  Genitourinary: Negative for difficulty urinating, frequency and vaginal pain.  Musculoskeletal: Positive for back pain. Negative for gait problem.  Skin: Negative for pallor and rash.  Neurological: Negative for dizziness, tremors, weakness, numbness and headaches.  Psychiatric/Behavioral: Negative for confusion and sleep disturbance.    Objective:  BP 128/68 (BP Location: Left Arm)   Pulse 75   Temp 98.3 F (36.8 C) (Oral)   Ht 5\' 2"  (1.575 m)   Wt 188 lb 12.8 oz (85.6 kg)   LMP  (LMP Unknown)   SpO2 99%   BMI 34.53 kg/m   BP Readings from  Last 3 Encounters:  10/09/20 128/68  08/29/20 120/70  07/11/20 130/70    Wt Readings from Last 3 Encounters:  10/09/20 188 lb 12.8 oz (85.6 kg)  08/29/20 182 lb 12.8 oz (82.9 kg)  07/11/20 184 lb 3.2 oz (83.6 kg)    Physical Exam Constitutional:      General: She is not in acute distress.    Appearance: She is well-developed.  HENT:     Head: Normocephalic.     Right Ear: External ear normal.     Left Ear: External ear normal.     Nose: Nose normal.  Eyes:     General:        Right eye: No discharge.        Left eye: No discharge.     Conjunctiva/sclera: Conjunctivae normal.     Pupils: Pupils are equal, round, and  reactive to light.  Neck:     Thyroid: No thyromegaly.     Vascular: No JVD.     Trachea: No tracheal deviation.  Cardiovascular:     Rate and Rhythm: Normal rate and regular rhythm.     Heart sounds: Normal heart sounds.  Pulmonary:     Effort: No respiratory distress.     Breath sounds: No stridor. No wheezing.  Abdominal:     General: Bowel sounds are normal. There is no distension.     Palpations: Abdomen is soft. There is no mass.     Tenderness: There is no abdominal tenderness. There is no guarding or rebound.  Musculoskeletal:        General: No tenderness.     Cervical back: Normal range of motion and neck supple.  Lymphadenopathy:     Cervical: No cervical adenopathy.  Skin:    Findings: No erythema or rash.  Neurological:     Mental Status: She is oriented to person, place, and time.     Cranial Nerves: No cranial nerve deficit.     Motor: No abnormal muscle tone.     Coordination: Coordination normal.     Deep Tendon Reflexes: Reflexes normal.  Psychiatric:        Behavior: Behavior normal.        Thought Content: Thought content normal.        Judgment: Judgment normal.   compression socks LS spine - pain w/ROM  Lab Results  Component Value Date   WBC 13.3 (H) 03/13/2020   HGB 8.7 (L) 03/13/2020   HCT 27.8 (L) 03/13/2020   PLT 242 03/13/2020   GLUCOSE 192 (H) 03/13/2020   CHOL 119 01/10/2020   TRIG 73.0 01/10/2020   HDL 48.50 01/10/2020   LDLCALC 56 01/10/2020   ALT 12 01/10/2019   AST 12 01/10/2019   NA 137 03/13/2020   K 3.6 03/13/2020   CL 106 03/13/2020   CREATININE 1.54 (H) 03/13/2020   BUN 25 (H) 03/13/2020   CO2 21 (L) 03/13/2020   TSH 2.17 01/10/2020   INR 1.1 06/17/2020   HGBA1C 7.0 (A) 08/29/2020   MICROALBUR 13.9 (H) 10/13/2007    CT ABDOMEN PELVIS WO CONTRAST  Result Date: 08/06/2020 CLINICAL DATA:  70 year old female with history of abdominal wall abscess status post percutaneous drain placement on 06/17/2020. Presents for  follow-up. EXAM: CT ABDOMEN AND PELVIS WITHOUT CONTRAST TECHNIQUE: Multidetector CT imaging of the abdomen and pelvis was performed following the standard protocol without IV contrast. COMPARISON:  06/03/2020, 06/17/2020 FINDINGS: Lower chest: No acute abnormality. Hepatobiliary: No focal liver abnormality is seen. No  gallstones, gallbladder wall thickening, or biliary dilatation. Pancreas: Unremarkable. No pancreatic ductal dilatation or surrounding inflammatory changes. Spleen: Normal in size without focal abnormality. Adrenals/Urinary Tract: Adrenal glands are unremarkable. Kidneys are normal, without renal calculi, focal lesion, or hydronephrosis. Bladder is unremarkable. Stomach/Bowel: Stomach is within normal limits. Postsurgical changes after right hemicolectomy without complicating features. Scattered colonic diverticula without surrounding inflammatory changes. No evidence of bowel wall thickening, distention, or inflammatory changes. Vascular/Lymphatic: Similar appearing scattered abdominal aortic atherosclerotic calcifications. No enlarged abdominal or pelvic lymph nodes. Reproductive: Uterus and bilateral adnexa are unremarkable. Other: Interval decreased size of previously visualized right lower anterior abdominal wall fluid collection with unchanged position of indwelling drain. The collection is now immeasurable. There is persistent surrounding fat stranding and anterior lower abdominal wall skin thickening, suggestive of persistent inflammatory process. Musculoskeletal: No acute osseous abnormality. Multilevel degenerative changes of the thoracolumbar spine, most pronounced at L2-L3 and L5-S1. IMPRESSION: 1. Interval resolution of previously visualized right anterior lower abdominal extraperitoneal fluid collection with unchanged position of indwelling pigtail drain. 2. No acute intra-abdominal abnormality. 3.  Aortic Atherosclerosis (ICD10-I70.0). PLAN: IR to remove indwelling percutaneous drain.   Follow-up as needed. [name] Cancer, MD [facility] Radiology Electronically Signed   By: [name] Cancer MD   On: 08/06/2020 12:45   IR Radiologist Eval & Mgmt  Result Date: 08/06/2020 Please refer to notes tab for details about interventional procedure. (Op Note)   Assessment & Plan:    [name] Kehr, MD

## 2020-10-09 NOTE — Assessment & Plan Note (Signed)
Cont w/Aspirin, Crestor

## 2020-10-09 NOTE — Assessment & Plan Note (Signed)
CBC w/Nephrology

## 2020-10-09 NOTE — Assessment & Plan Note (Signed)
F/u w/Dr [place] labs - BMET.  Good hydration, diabetes and blood pressure control

## 2020-10-09 NOTE — Assessment & Plan Note (Signed)
On Crestor 

## 2020-10-09 NOTE — Assessment & Plan Note (Signed)
On Glipizide A1c was 7%

## 2020-10-21 DIAGNOSIS — I700 Atherosclerosis of aorta: Secondary | ICD-10-CM | POA: Insufficient documentation

## 2020-10-21 DIAGNOSIS — E113511 Type 2 diabetes mellitus with proliferative diabetic retinopathy with macular edema, right eye: Secondary | ICD-10-CM | POA: Diagnosis not present

## 2020-10-21 NOTE — Assessment & Plan Note (Signed)
On diet, exercise 

## 2020-10-23 ENCOUNTER — Encounter: Payer: Self-pay | Admitting: Cardiovascular Disease

## 2020-10-23 ENCOUNTER — Other Ambulatory Visit: Payer: Self-pay

## 2020-10-23 ENCOUNTER — Ambulatory Visit (INDEPENDENT_AMBULATORY_CARE_PROVIDER_SITE_OTHER): Payer: Medicare Other | Admitting: Cardiovascular Disease

## 2020-10-23 VITALS — BP 124/58 | HR 72 | Ht 62.0 in | Wt 187.6 lb

## 2020-10-23 DIAGNOSIS — I2510 Atherosclerotic heart disease of native coronary artery without angina pectoris: Secondary | ICD-10-CM

## 2020-10-23 NOTE — Patient Instructions (Signed)
Medication Instructions:  Your physician recommends that you continue on your current medications as directed. Please refer to the Current Medication list given to you today.  *If you need a refill on your cardiac medications before your next appointment, please call your pharmacy*   Lab Work: TODAY: Lipids, alt, bmet If you have labs (blood work) drawn today and your tests are completely normal, you will receive your results only by: . MyChart Message (if you have MyChart) OR . A paper copy in the mail If you have any lab test that is abnormal or we need to change your treatment, we will call you to review the results.   Testing/Procedures: none   Follow-Up: At [facility], you and your health needs are our priority.  As part of our continuing mission to provide you with exceptional heart care, we have created designated Provider Care Teams.  These Care Teams include your primary Cardiologist (physician) and Advanced Practice Providers (APPs -  Physician Assistants and Nurse Practitioners) who all work together to provide you with the care you need, when you need it.  We recommend signing up for the patient portal called "MyChart".  Sign up information is provided on this After Visit Summary.  MyChart is used to connect with patients for Virtual Visits (Telemedicine).  Patients are able to view lab/test results, encounter notes, upcoming appointments, etc.  Non-urgent messages can be sent to your provider as well.   To learn more about what you can do with MyChart, go to [email or web address].    Your next appointment:   1 year(s)  The format for your next appointment:   In Person  Provider:   You may see Philip Nahser, MD or one of the following Advanced Practice Providers on your designated Care Team:    Scott Weaver, PA-C  Vin Bhagat, PA-C     

## 2020-10-23 NOTE — Progress Notes (Signed)
Cardiology Office Note   Date:  10/23/2020   ID:  [name], DOB 1950-08-12, MRN [id number]  PCP:  [name] Anger, [name]  Cardiologist:   [name] Moores, [name]   Chief Complaint  Patient presents with  . Coronary Artery Disease  . Hyperlipidemia   1. Coronary artery disease-status post PTCA and stenting of her mid LAD using a 3.5 x 15 mm Alpine stent ( DES) ( Dec. 5, 2014)  2. chronic systolic congestive heart failure-ejection fraction of 35% 2. Hypertension 3. COPD 4. Diabetes mellitus 5. History colon cancer    [name] is seen today. She was initially seen by [name] in the hospital. She has a history of chronic systolic congestive heart failure with an ejection fraction of around 35%. She has a history of coronary artery disease. She is status post PTCA and stenting of her mid left anterior descending artery using a 3.5 x 15 mm Alpine stent. She has a history of COPD. [name] not had a cigarette since he left the hospital. She also has a history of diabetes mellitus and history of colon cancer  She has mild interscapular pain this morning . And the pain resolved after she drank some club soda. Did not feel like her MI pain.  She has been walking every morining at the mall and at [place].   Her BP is up today. - she may have had lots of salt last night. The BP has remained high   September 20, 2013  [name] has been having some episodes of chest discomfort. She initially thought it might be due to indigestion or due to something that she ate. It radiated to her interscapular region. It was associated with some hand and normal tingling. The pain was intermittent but lasted for several hours. She took a sublingual nitroglycerin and the pain resolved very quickly and she did not have any further episodes the whole rest of the day.  She informed that she ran out of her Lasix 3 days ago.  November 01, 2013:  [name] is feeling well. She stopped smoking the  day of her cath. She is eating more "penny candy" to replace the habit.   January 31, 2014:  She is doing ok. She has no angina. Not exercising. 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  She has a bruise on her leg where she bumped into a table.   Oct. 29, 2015:  [name] is doing very well. She is not having any episodes of chest pain. [name] not exercising quite as much as she would like. Stent was placed in December of 2014. She had a nose bleed last week. ( she is on Effient until Dec. 2015). Her Bp was A bit elevated because of some salt in her diet.  No CP or dyspnea    November 02, 2014:  [name] is a 70 y.o. female who presents for follow-up of her coronary artery disease. [name] done fairly well. She has had some muscle pain and a rash. She has occasional episodes of dizziness. She had some non-cardiac CP several weeks ago when she started Trajenta.  Stopped the med and has not had any further episodes of CP.    Oct. 26, 2016  [name] is doing well She had a echo recently   Left ventricle: The cavity size was normal. Wall thickness was increased in a pattern of mild LVH with focal basal septal hypertrophy. Systolic function was normal. The estimated ejection fraction was in  the range of 60% to 65%. Wall motion was normal; there were no regional wall motion abnormalities. Doppler parameters are consistent with abnormal left ventricular relaxation (grade 1 diastolic dysfunction). - Aortic valve: There was no stenosis. There was trivial regurgitation. - Mitral valve: Mildly calcified annulus. Mildly calcified leaflets . There was no significant regurgitation. - Right ventricle: The cavity size was normal. Systolic function was normal. - Tricuspid valve: Peak RV-RA gradient (S): 21 mm Hg. - Pulmonary arteries: PA peak pressure: 24 mm Hg (S). - Inferior vena cava: The vessel was normal in size. The respirophasic diameter changes were in the  normal range (>= 50%), consistent with normal central venous pressure  [name] is doing well  BP has been elevated for the past 2 weeks.  Has had some left arm pain / ached Left arm aches all the time,  Constant.  Worse with sitting, not worsened by exercise .   October 28, 2015:  [name] is doing well.  She went with [facility] - it took 2 weeks for them to get her meds to her .  Has chronic left arm pain .  Oct. 23, 2017:  Doing well from a cardiac standpoint Having eye surgery . Has had a chronic cough [name] changed her Coreg to [facility] Did not seem to help the cough   Jan. 24, 2018:  doing well. Still eating some salty foods.  Still smoking some,  Advised her to quit.  Echo from 2016 shows normal LV systolic function Needs to have an contrast imaging study of her eye. She is at low risk for that procedure.   Aug. 17, 2018:  [name] is seen back today for follow up of her CHF   Dec 03, 2017:    [name] is seen back for follow up of her CHF and HTN Doing well. EF is now normal.   Gets some exercise - not as much as she would like  Has some ankle swelling  Has had some arm pain that she associated with the Losartan ( MSK pain is mentioned in [place])   Nov. 6, 2019:  [name] is seen for follow up visit  BP is well controlled  Had an episodes of CP while very upset ( her brother had just had a aortic dissection ( 10.5 hours of surgery )  Brother is 89 yo .    No further episodes of CP after the stress  Has a rash on the left side of the chest  She was not tolerating the micardis ( cough)  She started taking hydralazine on September 26.  She now has developed a rash on the left side of her chest.  She was started on torsemide instead of furosemide by her primary medical doctor.  She does okay when she takes the 50 mg of torsemide.  When she takes 100 mg of torsemide she has severe muscle cramping.  Nov. 9, 2020 :  No cp. No dyspnea Had some  coughing in sept when she was exposed to marijuana smoke from neighbors.   Had eye surgery last week.    October 23, 2020:  [name] is seen today for follow-up of her coronary artery disease and hyperlipidemia.  She has a history of hypertension.  She has grade 1 diastolic dysfunction. No CP ,  No dyspnea.  No bacon in 4 months  Was hospitalized with sepsis several months ago.  Started as an abscess on her legs.  She wound up in a nursing  home because of profound weakness.  She is now walking quite a bit better. Still smoking . I recommended cessation .  1. Coronary artery disease-status post PTCA and stenting of her mid LAD using a 3.5 x 15 mm Alpine stent ( DES) ( Dec. 5, 2014)  2. chronic systolic congestive heart failure-ejection fraction of 35% 2. Hypertension 3. COPD  Echo from 2020 shows normal LV systolic function .    Past Medical History:  Diagnosis Date  . Anemia   . COPD (chronic obstructive pulmonary disease) ([place])   . Diabetes mellitus   . Hyperlipidemia   . Hypertension   . Ischemic cardiomyopathy 06/11/2013  . Low back pain   . NSTEMI (non-ST elevated myocardial infarction) ([place]) 06/11/2013  . Obesity   . Personal history of colon cancer   . Renal insufficiency     Past Surgical History:  Procedure Laterality Date  . COLECTOMY    . INCISION AND DRAINAGE PERIRECTAL ABSCESS N/A 03/07/2020   Procedure: IRRIGATION AND DEBRIDEMENT PERINEUM  AND PANNUS ABSCESS;  Surgeon: Erroll Luna, [name];  Location: [place];  Service: General;  Laterality: N/A;  . IR RADIOLOGIST EVAL & MGMT  08/06/2020  . LEFT HEART CATHETERIZATION WITH CORONARY ANGIOGRAM N/A 06/09/2013   Procedure: LEFT HEART CATHETERIZATION WITH CORONARY ANGIOGRAM;  Surgeon: Ramond Dial, [name];  Location[facility] C[facility] LAB;  Service: Cardiovascular;  Laterality: N/A;     Current Outpatient Medications  Medication Sig Dispense Refill  . amLODipine (NORVASC) 10 MG tablet TAKE ONE-HALF TABLET BY  MOUTH DAILY 45 tablet  3  . aspirin 81 MG chewable tablet Chew 1 tablet (81 mg total) by mouth daily.    [name] BYSTOLIC 10 MG tablet TAKE 1 TABLET BY MOUTH EVERY DAY 90 tablet 3  . calcium carbonate (OS-CAL) 600 MG TABS tablet Take 600 mg by mouth 2 (two) times daily with a meal.    . Cholecalciferol (VITAMIN D3) 50 MCG (2000 UT) capsule Take 1 capsule (2,000 Units total) by mouth daily. 100 capsule 3  . gabapentin (NEURONTIN) 100 MG capsule Take 1 capsule (100 mg total) by mouth 3 (three) times daily. 90 capsule 3  . glipiZIDE (GLUCOTROL XL) 5 MG 24 hr tablet Take 1 tablet (5 mg total) by mouth daily with breakfast. 90 tablet 3  . HYDROcodone-acetaminophen (NORCO) 7.5-325 MG tablet Take 0.5-1 tablets by mouth every 6 (six) hours as needed for severe pain. 20 tablet 0  . hydrOXYzine (ATARAX/VISTARIL) 25 MG tablet Take 1-2 tablets (25-50 mg total) by mouth every 8 (eight) hours as needed for itching. 60 tablet 0  . Iron-Vitamin C (IRON 100/C PO) Take by mouth.    . lansoprazole (PREVACID) 15 MG capsule Take 1 capsule (15 mg total) by mouth daily at 12 noon. 90 capsule 3  . ondansetron (ZOFRAN) 4 MG tablet Take 1 tablet (4 mg total) by mouth every 8 (eight) hours as needed for nausea or vomiting. 20 tablet 0  . ONETOUCH ULTRA test strip USE TO TEST BLOOD SUGAR TWO TIMES A DAY AS DIRECTED 200 strip 3  . potassium chloride SA (KLOR-CON) 20 MEQ tablet Take 1 tablet (20 mEq total) by mouth daily as needed (cramping).    . rosuvastatin (CRESTOR) 5 MG tablet TAKE 1 TABLET BY MOUTH  DAILY AT 6PM 90 tablet 3  . torsemide (DEMADEX) 20 MG tablet Take 1 tablet (20 mg total) by mouth daily.    . vitamin B-12 (CYANOCOBALAMIN) 1000 MCG tablet Take 1,000 mcg by mouth  daily.     No current facility-administered medications for this visit.    Allergies:   Tradjenta [linagliptin], Atorvastatin, Enalapril maleate, Farxiga [dapagliflozin], Hydrochlorothiazide, Kenalog [triamcinolone acetonide], Levaquin [levofloxacin], Metformin and related,  Propoxyphene n-acetaminophen, Simvastatin, and Spironolactone    Social History:  The patient  reports that she has been smoking cigarettes. She has never used smokeless tobacco. She reports that she does not drink alcohol and does not use drugs.   Family History:  The patient's family history includes Hypertension in an other family member.    ROS:   Noted in current hx , otherwise negative    Physical Exam: Blood pressure (!) 124/58, pulse 72, height 5\' 2"  (1.575 m), weight 187 lb 9.6 oz (85.1 kg), SpO2 99 %.  GEN:  Well nourished, well developed in no acute distress HEENT: Normal NECK: No JVD; No carotid bruits LYMPHATICS: No lymphadenopathy CARDIAC: RRR , no murmurs, rubs, gallops RESPIRATORY:  Clear to auscultation without rales, wheezing or rhonchi  ABDOMEN: Soft, non-tender, non-distended MUSCULOSKELETAL:  No edema; No deformity  SKIN: Warm and dry NEUROLOGIC:  Alert and oriented x 3   EKG:       October 23, 2020: Normal sinus rhythm at 72.  No ST or T wave changes.  Recent Labs: 01/10/2020: TSH 2.17 03/13/2020: BUN 25; Creatinine, Ser 1.54; Hemoglobin 8.7; Platelets 242; Potassium 3.6; Sodium 137    Lipid Panel    Component Value Date/Time   CHOL 119 01/10/2020 1057   CHOL 129 03/29/2018 1042   TRIG 73.0 01/10/2020 1057   HDL 48.50 01/10/2020 1057   HDL 46 03/29/2018 1042   CHOLHDL 2 01/10/2020 1057   VLDL 14.6 01/10/2020 1057   LDLCALC 56 01/10/2020 1057   LDLCALC 69 03/29/2018 1042      Wt Readings from Last 3 Encounters:  10/23/20 187 lb 9.6 oz (85.1 kg)  10/09/20 188 lb 12.8 oz (85.6 kg)  08/29/20 182 lb 12.8 oz (82.9 kg)      Other studies Reviewed: Additional studies/ records that were reviewed today include: . Review of the above records demonstrates:    ASSESSMENT AND PLAN:  1. Coronary artery disease-status post PTCA and stenting of her mid LAD using a 3.5 x 15 mm Alpine stent ( DES) ( Dec. 5, 2014)   no angina  Ive encouraged her to stop  smoking  Lipids were well controlled last year  Cont current meds.   check lipids, ALT, BMP today   2. chronic systolic congestive heart failure- initial ejection fraction of 35% -   breathing is good   3.  Hypertensive heart disease with congestive heart failure  -     4. Diabetes mellitus 5. History colon cancer 6. COPD - encouraged smoking cessation   Current medicines are reviewed at length with the patient today.  The patient does not have concerns regarding medicines.    Labs/ tests ordered today include:   Orders Placed This Encounter  Procedures  . ALT  . Basic metabolic panel  . Lipid panel  . EKG 12-Lead        [name] Moores, [name]  10/23/2020 3:27 PM    [facility] [place], [place], [place]  [address] Phone: [phone]; Fax: [phone]

## 2020-10-24 LAB — BASIC METABOLIC PANEL
BUN/Creatinine Ratio: 26 (ref 12–28)
BUN: 70 mg/dL — ABNORMAL HIGH (ref 8–27)
CO2: 21 mmol/L (ref 20–29)
Calcium: 9.5 mg/dL (ref 8.7–10.3)
Chloride: 102 mmol/L (ref 96–106)
Creatinine, Ser: 2.74 mg/dL — ABNORMAL HIGH (ref 0.57–1.00)
Glucose: 209 mg/dL — ABNORMAL HIGH (ref 65–99)
Potassium: 4.8 mmol/L (ref 3.5–5.2)
Sodium: 139 mmol/L (ref 134–144)
eGFR: 18 mL/min/{1.73_m2} — ABNORMAL LOW (ref >59)

## 2020-10-24 LAB — ALT: ALT: 9 IU/L (ref 0–32)

## 2020-10-24 LAB — LIPID PANEL
Chol/HDL Ratio: 2.7 ratio (ref 0.0–4.4)
Cholesterol, Total: 130 mg/dL (ref 100–199)
HDL: 49 mg/dL (ref >39)
LDL Chol Calc (NIH): 70 mg/dL (ref 0–99)
Triglycerides: 47 mg/dL (ref 0–149)
VLDL Cholesterol Cal: 11 mg/dL (ref 5–40)

## 2020-10-28 DIAGNOSIS — E113512 Type 2 diabetes mellitus with proliferative diabetic retinopathy with macular edema, left eye: Secondary | ICD-10-CM | POA: Diagnosis not present

## 2020-11-29 ENCOUNTER — Telehealth: Payer: Self-pay | Admitting: Internal Medicine

## 2020-11-29 NOTE — Telephone Encounter (Signed)
Does [name] needs something for back pain?  Did she try to use a higher dose of gabapentin?  She is on a very low dose.  She can take gabapentin 200-300 mg up to 3 times a day.  Thanks

## 2020-11-29 NOTE — Telephone Encounter (Signed)
Notified pt w/Md response. Pt states she will try, but she feel like he has gout. She states  The pain goes up into her big toe. Her feet is swollen and she feel pin like needles in her foot. Requesting something for gout.Marland KitchenJohny Chess

## 2020-11-29 NOTE — Telephone Encounter (Signed)
Patient called and said that gabapentin (NEURONTIN) 100 MG capsule is not helping her. She was wondering if something else could be called in. Her last OV was 10-09-20. She said that it can be sent to [id number] - [place], [place]. Please advise

## 2020-11-30 MED ORDER — METHYLPREDNISOLONE 4 MG PO TBPK: ORAL_TABLET | ORAL | 0 refills | Status: DC

## 2020-11-30 NOTE — Addendum Note (Signed)
Addended by: [name] on: 11/30/2020 01:08 PM   Modules accepted: Orders

## 2020-11-30 NOTE — Telephone Encounter (Signed)
I will send a Medrol Dosepak.  Thanks

## 2020-12-03 NOTE — Telephone Encounter (Signed)
Called pt to verify if she reieved medrol pack over the weekend. Pt states she did and has helped out a whole lot.Marland KitchenJohny [name]

## 2020-12-26 DIAGNOSIS — J449 Chronic obstructive pulmonary disease, unspecified: Secondary | ICD-10-CM | POA: Diagnosis not present

## 2020-12-26 DIAGNOSIS — M109 Gout, unspecified: Secondary | ICD-10-CM | POA: Diagnosis not present

## 2020-12-26 DIAGNOSIS — E1122 Type 2 diabetes mellitus with diabetic chronic kidney disease: Secondary | ICD-10-CM | POA: Diagnosis not present

## 2020-12-26 DIAGNOSIS — N184 Chronic kidney disease, stage 4 (severe): Secondary | ICD-10-CM | POA: Diagnosis not present

## 2020-12-26 DIAGNOSIS — I5189 Other ill-defined heart diseases: Secondary | ICD-10-CM | POA: Diagnosis not present

## 2020-12-26 DIAGNOSIS — I129 Hypertensive chronic kidney disease with stage 1 through stage 4 chronic kidney disease, or unspecified chronic kidney disease: Secondary | ICD-10-CM | POA: Diagnosis not present

## 2021-01-01 DIAGNOSIS — E113511 Type 2 diabetes mellitus with proliferative diabetic retinopathy with macular edema, right eye: Secondary | ICD-10-CM | POA: Diagnosis not present

## 2021-01-01 DIAGNOSIS — H31093 Other chorioretinal scars, bilateral: Secondary | ICD-10-CM | POA: Diagnosis not present

## 2021-01-01 DIAGNOSIS — H25813 Combined forms of age-related cataract, bilateral: Secondary | ICD-10-CM | POA: Diagnosis not present

## 2021-01-01 DIAGNOSIS — H04123 Dry eye syndrome of bilateral lacrimal glands: Secondary | ICD-10-CM | POA: Diagnosis not present

## 2021-01-01 DIAGNOSIS — E113513 Type 2 diabetes mellitus with proliferative diabetic retinopathy with macular edema, bilateral: Secondary | ICD-10-CM | POA: Diagnosis not present

## 2021-01-01 DIAGNOSIS — H3582 Retinal ischemia: Secondary | ICD-10-CM | POA: Diagnosis not present

## 2021-01-02 ENCOUNTER — Encounter: Payer: Self-pay | Admitting: Internal Medicine

## 2021-01-02 ENCOUNTER — Other Ambulatory Visit: Payer: Self-pay

## 2021-01-02 ENCOUNTER — Ambulatory Visit (INDEPENDENT_AMBULATORY_CARE_PROVIDER_SITE_OTHER): Payer: Medicare Other | Admitting: Internal Medicine

## 2021-01-02 VITALS — BP 122/70 | HR 75 | Ht 62.0 in | Wt 188.0 lb

## 2021-01-02 DIAGNOSIS — E785 Hyperlipidemia, unspecified: Secondary | ICD-10-CM | POA: Diagnosis not present

## 2021-01-02 DIAGNOSIS — E1159 Type 2 diabetes mellitus with other circulatory complications: Secondary | ICD-10-CM

## 2021-01-02 DIAGNOSIS — E1165 Type 2 diabetes mellitus with hyperglycemia: Secondary | ICD-10-CM

## 2021-01-02 LAB — POCT GLYCOSYLATED HEMOGLOBIN (HGB A1C): Hemoglobin A1C: 7.6 % — AB (ref 4.0–5.6)

## 2021-01-02 NOTE — Progress Notes (Addendum)
Patient ID: [name], female   DOB: [birth year], 70 y.o.   MRN: [id number]  This visit occurred during the SARS-CoV-2 public health emergency.  Safety protocols were in place, including screening questions prior to the visit, additional usage of staff PPE, and extensive cleaning of exam room while observing appropriate contact time as indicated for disinfecting solutions.   HPI: [name] is a 70 y.o.-year-old female, returning for f/u DM2, dx in 2006, insulin-independent, uncontrolled, with complications (CAD - s/p AMI 06/2013, CHF; CKD; PN; DR). Last visit 4 months ago.  Interim history: No increased urination, blurry vision, nausea, chest pain. She has gout in L foot. On Prednisone >> sugars are higher: 400.  They improved afterwards, but they are still higher than usual for her. She started iron 2x a day.  Reviewed HbA1c levels: Lab Results  Component Value Date   HGBA1C 7.0 (A) 08/29/2020   HGBA1C 8.3 (A) 05/16/2020   HGBA1C 7.2 (A) 01/10/2020  05/16/2020: HbA1c calculated from fructosamine is: 7.4%, lower than the directly measured HbA1c. 05/02/2019: HbA1c calculated from fructosamine was 7.0% 12/30/2018: HbA1c calculated from fructosamine is higher, at 8.2%, possibly 2/2 drinking juice 08/24/2018: HbA1c calculated from fructosamine is higher, at 8%, possibly 2/2 ABx  04/20/2018: HbA1c calculated from fructosamine is slightly better than before, at 7% 12/15/2017: HbA1c calculated from fructosamine: 7.17%, slightly improved from last visit  08/17/2017: HbA1c calculated from fructosamine: 7.2%, slightly improved 04/15/2017: HbA1c calculated from the fructosamine is lower than measured, at 7.3% 08/21/2016: HbA1c 6.0%. She started to change her diet after her hemoglobin A1c returned high, at 16.2% in 09/2014. She cut down portions, changed the meal content to include more fiber and less carbs.  HbA1c decreased dramatically.  Pt is on: - Glipizide ER 5 mg in  a.m.-started 2016 >> 10 mg >> 5 mg before breakfast She stopped metformin ER in 01/2017. She tried Tradjenta 5 mg daily in am >> CP with it (started 10/2014) >> had to stop She tried [place] >> decreased [name] and yeast inf. She tried regular metformin >> diarrhea She refused insulin in the past.  Pt checks her sugars twice a day per review of her log: - am: 116-140, 154 >> 98-136, 142 >> 77, 98-36, 148, 155 - 2h after b'fast: 122-139 >> 129-144 >> 130-140 >> 114 - before lunch: 125 >> 89, 121-151 >> 103-141, 158 - 2h after lunch: 169 >> 120-130 >> n/c  - before dinner:  110-144 >> 118-130 >> n/c - 2h after dinner: 128-176 >> 119-147, 155 >> 115-166 - bedtime: 137 >> n/c >> 147-157 >> n/c - nighttime: n/c Lowest sugar was  68 >> 66  >> 77; it is unclear at which level she has hypoglycemia awareness. Highest sugar was 169 >> 156 >> 176 >> 400 (Presnisone).  Glucometer: One Touch Ultra  Pt's meals are: - Breakfast: oatmeal, cereals, [place] bacon, egg toast; cinnamon on oatmeal or cheerios - Lunch: sandwich or fruit salad - Dinner: salmon/chicken, Brussel sprouts, other veggies, sweet potatoes or brown rice, spaghetti - Snacks: 2: carrots with dip; yoghurt with fruit, veggies + dip; fruit She walks for exercise. She saw Dr. Hollie Salk with nephrology.  She was started on a high potassium, low protein diet  -+ CKD-sees nephrology; latest BUN/creatinine: Lab Results  Component Value Date   BUN 70 (H) 10/23/2020   CREATININE 2.74 (H) 10/23/2020  08/09/2020: 68/2.15, [name] 26, glucose 145, protein to creatinine ratio 68, ACR 9 She stopped olmesartan due to worsening kidney  function and cough.  -+ HL; last set of lipids: Lab Results  Component Value Date   CHOL 130 10/23/2020   HDL 49 10/23/2020   LDLCALC 70 10/23/2020   TRIG 47 10/23/2020   CHOLHDL 2.7 10/23/2020  On Crestor 5 daily.  - last eye exam was in 01/01/2021: + DR, + cataract reportedly.  Had laser surgery. Dr. Posey Pronto.   - she  has numbness and tingling in her feet.  She also has a history of HTN, GERD, and anemia. In 02/2020, she was admitted multiple perineal abscesses and cellulitis.  These were I&D and she was started on antibiotics. She was in [facility] for 2 weeks - she did not like her care there, including the meals. Of note, patient discussed with nephrology about dialysis and she would never want to proceed with this.  She has a history of goiter - a thyroid ultrasound from 2016 showed only small cysts.  Pt denies: - feeling nodules in neck - hoarseness - dysphagia - choking - SOB with lying down  Latest TSH normal: Lab Results  Component Value Date   TSH 2.17 01/10/2020   ROS: Constitutional: no weight gain/no weight loss, no fatigue, no subjective hyperthermia, no subjective hypothermia Eyes: no blurry vision, no xerophthalmia ENT: no sore throat, + see HPI Cardiovascular: no CP/no SOB/no palpitations/+ leg swelling Respiratory: no cough/no SOB/no wheezing Gastrointestinal: no N/no V/no D/no C/no acid reflux Musculoskeletal: no muscle aches/+ joint aches Skin: no rashes, no hair loss Neurological: no tremors/+ numbness/+ tingling/no dizziness  I reviewed pt's medications, allergies, PMH, social hx, family hx, and changes were documented in the history of present illness. Otherwise, unchanged from my initial visit note.  Past Medical History:  Diagnosis Date   Anemia    COPD (chronic obstructive pulmonary disease) ([place])    Diabetes mellitus    Hyperlipidemia    Hypertension    Ischemic cardiomyopathy 06/11/2013   Low back pain    NSTEMI (non-ST elevated myocardial infarction) ([place]) 06/11/2013   Obesity    Personal history of colon cancer    Renal insufficiency    Past Surgical History:  Procedure Laterality Date   COLECTOMY     INCISION AND DRAINAGE PERIRECTAL ABSCESS N/A 03/07/2020   Procedure: IRRIGATION AND DEBRIDEMENT PERINEUM  AND PANNUS ABSCESS;  Surgeon: Erroll Luna,  MD;  Location: [facility];  Service: General;  Laterality: N/A;   IR RADIOLOGIST EVAL & MGMT  08/06/2020   LEFT HEART CATHETERIZATION WITH CORONARY ANGIOGRAM N/A 06/09/2013   Procedure: LEFT HEART CATHETERIZATION WITH CORONARY ANGIOGRAM;  Surgeon: Ramond Dial, MD;  Location: [facility] CATH LAB;  Service: Cardiovascular;  Laterality: N/A;   History   Social History   Marital Status: Single    Spouse Name: N/A   Number of Children: 0   Occupational History   retired   Social History Main Topics   Smoking status:  former smoker     Types: Cigarettes   Smokeless tobacco: Never Used   Alcohol Use: No   Drug Use: No   Current Outpatient Medications on File Prior to Visit  Medication Sig Dispense Refill   amLODipine (NORVASC) 10 MG tablet TAKE ONE-HALF TABLET BY  MOUTH DAILY 45 tablet 3   aspirin 81 MG chewable tablet Chew 1 tablet (81 mg total) by mouth daily.     BYSTOLIC 10 MG tablet TAKE 1 TABLET BY MOUTH EVERY DAY 90 tablet 3   calcium carbonate (OS-CAL) 600 MG TABS tablet Take 600  mg by mouth 2 (two) times daily with a meal.     Cholecalciferol (VITAMIN D3) 50 MCG (2000 UT) capsule Take 1 capsule (2,000 Units total) by mouth daily. 100 capsule 3   gabapentin (NEURONTIN) 100 MG capsule Take 1 capsule (100 mg total) by mouth 3 (three) times daily. 90 capsule 3   glipiZIDE (GLUCOTROL XL) 5 MG 24 hr tablet Take 1 tablet (5 mg total) by mouth daily with breakfast. 90 tablet 3   HYDROcodone-acetaminophen (NORCO) 7.5-325 MG tablet Take 0.5-1 tablets by mouth every 6 (six) hours as needed for severe pain. 20 tablet 0   hydrOXYzine (ATARAX/VISTARIL) 25 MG tablet Take 1-2 tablets (25-50 mg total) by mouth every 8 (eight) hours as needed for itching. 60 tablet 0   Iron-Vitamin C (IRON 100/C PO) Take by mouth.     lansoprazole (PREVACID) 15 MG capsule Take 1 capsule (15 mg total) by mouth daily at 12 noon. 90 capsule 3   methylPREDNISolone (MEDROL DOSEPAK) 4 MG TBPK tablet As directed 21 tablet 0    ondansetron (ZOFRAN) 4 MG tablet Take 1 tablet (4 mg total) by mouth every 8 (eight) hours as needed for nausea or vomiting. 20 tablet 0   ONETOUCH ULTRA test strip USE TO TEST BLOOD SUGAR TWO TIMES A DAY AS DIRECTED 200 strip 3   potassium chloride SA (KLOR-CON) 20 MEQ tablet Take 1 tablet (20 mEq total) by mouth daily as needed (cramping).     rosuvastatin (CRESTOR) 5 MG tablet TAKE 1 TABLET BY MOUTH  DAILY AT 6PM 90 tablet 3   torsemide (DEMADEX) 20 MG tablet Take 1 tablet (20 mg total) by mouth daily.     vitamin B-12 (CYANOCOBALAMIN) 1000 MCG tablet Take 1,000 mcg by mouth daily.     No current facility-administered medications on file prior to visit.   Allergies  Allergen Reactions   Tradjenta [Linagliptin] Anaphylaxis    CP   Atorvastatin     REACTION: aches and pains   Enalapril Maleate     REACTION: cough   Farxiga [Dapagliflozin] Itching   Hydrochlorothiazide     REACTION: hair loss   Kenalog [Triamcinolone Acetonide]     HANDS NUMB    Levaquin [Levofloxacin] Hives   Metformin And Related     Diarrhea, dizziness   Propoxyphene N-Acetaminophen Hives   Simvastatin     REACTION: cramps   Spironolactone     REACTION: cramps   Family History  Problem Relation Age of Onset   Hypertension Other    PE: BP 122/70   Pulse 75   Ht 5\' 2"  (1.575 m)   Wt 188 lb (85.3 kg)   LMP  (LMP Unknown)   SpO2 98%   BMI 34.39 kg/m  Body mass index is 34.39 kg/m. Wt Readings from Last 3 Encounters:  01/02/21 188 lb (85.3 kg)  10/23/20 187 lb 9.6 oz (85.1 kg)  10/09/20 188 lb 12.8 oz (85.6 kg)   Constitutional: overweight, in NAD Eyes: PERRLA, EOMI, no exophthalmos ENT: moist mucous membranes, no thyromegaly, no cervical lymphadenopathy Cardiovascular: RRR, No RG, + 1/6 SEM, + L>R LE edema Respiratory: CTA B Gastrointestinal: abdomen soft, NT, ND, BS+ Musculoskeletal: no deformities, strength intact in all 4 Skin: moist, warm, no rashes Neurological: no tremor with  outstretched hands, DTR normal in all 4   ASSESSMENT: 1. DM2, insulin-independent, uncontrolled, with complications - CAD, s/p AMI 06/2013 - Dr Acie Fredrickson - CHF - CKD - PN - DR - Dr. Jalene Mullet ([facility]) -  on IO injections >> improving  2. Goiter - No neck compression symptoms  - 10/19/2014: thyroid ultrasound: Right thyroid lobe: 4.0 x 1.5 x 1.6 cm. Heterogeneous parenchyma with multiple small cysts identified. The largest measures approximately 0.5 x 0.3 x 0.5 cm. All of the small cysts in the right lobe appears simple and benign.  Left thyroid lobe: 4.5 x 1.4 x 2.0 cm. Heterogeneous parenchyma with multiple cysts. The largest measures approximately 1.0 x 0.4 x 0.5 cm. 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 Similar smaller cyst measures 0.8 cm in greatest diameter. There is a small solid nodule measuring 0.7 x 0.4 x 0.6 cm.  Isthmus Thickness: 0.4 cm.  No nodules visualized.  Lymphadenopathy: None visualized.            Multicystic thyroid.  3. HL  PLAN:  1. Patient with longstanding, uncontrolled, type 2 diabetes, on long-acting sulfonylurea, only, with initially improved control after switching to a more plant-based diet with reduced processed foods and sweets.  However, she relaxed her diet afterwards and sugars increased.  Of note, she refused insulin.  At last visit, h sugars improved and they were mostly at goal with very few mild hyperglycemic excursions.  She connected these to dietary changes.  She was working on improving diet.  We did not change her regimen at that time. -At today's visit, she has a gout attack and she is in significant pain.  Left leg is swollen more than the right.  She has been on prednisone and sugars increased to 400s.  They improved afterwards but it they are still not back to her previous levels.  For now, I advised her to continue the same dose of glipizide since sugars are improving but if she gets  another steroid, she would need to increase the dose to 2 glipizide tablets in the morning. -I advised her to: Patient Instructions  Please continue: - Glipizide ER 5 mg before breakfast (if you need to have steroid again, increase the dose to 10 mg daily for few days)  Please return in 4 months with your sugar log.  - we checked her HbA1c: 7.6% (higher) - advised to check sugars at different times of the day - 1x a day, rotating check times - advised for yearly eye exams >> she is UTD - return to clinic in 4 months   2. Goiter -No neck compression symptoms -She only had small thyroid cysts per review of the latest thyroid ultrasound report -Latest TSH from 01/2020 was normal -No follow-up necessary unless she develops neck compression symptoms  3. HL -Reviewed latest lipid panel from 10/2020: Fractions at goal: Lab Results  Component Value Date   CHOL 130 10/23/2020   HDL 49 10/23/2020   LDLCALC 70 10/23/2020   TRIG 47 10/23/2020   CHOLHDL 2.7 10/23/2020  -She was off Crestor 5 mg daily at last visit after being in [facility], but we restarted it.  She tolerates it well  Philemon Kingdom, MD PhD [facility] Endocrinology

## 2021-01-02 NOTE — Addendum Note (Signed)
Addended by: Tyrone Apple on: 01/02/2021 01:54 PM   Modules accepted: Orders

## 2021-01-02 NOTE — Patient Instructions (Addendum)
Please continue: - Glipizide ER 5 mg before breakfast (if you need to have steroid again, increase the dose to 10 mg daily for few days)  Please return in 4 months with your sugar log.

## 2021-01-03 ENCOUNTER — Telehealth: Payer: Self-pay | Admitting: Internal Medicine

## 2021-01-03 NOTE — Telephone Encounter (Signed)
    Patient requesting medication  for gout flare up in foot. Patent states she has taken Prednisone in the past for flare up   Please advise

## 2021-01-04 MED ORDER — METHYLPREDNISOLONE 4 MG PO TBPK: ORAL_TABLET | ORAL | 0 refills | Status: DC

## 2021-01-04 NOTE — Telephone Encounter (Signed)
Okay Medrol Dosepak.  Please warn the patient that sugar will go up temporarily.  Thanks

## 2021-01-07 ENCOUNTER — Other Ambulatory Visit: Payer: Self-pay | Admitting: Internal Medicine

## 2021-01-07 NOTE — Telephone Encounter (Signed)
Called to to verify if she received medication. Pt states yes she picked rx up on Sunday. Foot is feeling a lot better...[name]

## 2021-01-30 ENCOUNTER — Telehealth: Payer: Self-pay | Admitting: Internal Medicine

## 2021-01-30 ENCOUNTER — Other Ambulatory Visit: Payer: Self-pay | Admitting: Cardiovascular Disease

## 2021-01-30 NOTE — Chronic Care Management (AMB) (Signed)
  Chronic Care Management   Note  01/30/2021 Name: [name] MRN: [id number] DOB: 1951/02/27  [name] is a 71 y.o. year old female who is a primary care patient of [name], Evie Lacks, MD. I reached out to Jobe Marker by phone today in response to a referral sent by Ms. Rolena Infante Gerstenberger's PCP, [name], Evie Lacks, MD.   Ms. Freeman was given information about Chronic Care Management services today including:  CCM service includes personalized support from designated clinical staff supervised by her physician, including individualized plan of care and coordination with other care providers 24/7 contact phone numbers for assistance for urgent and routine care needs. Service will only be billed when office clinical staff spend 20 minutes or more in a month to coordinate care. Only one practitioner may furnish and bill the service in a calendar month. The patient may stop CCM services at any time (effective at the end of the month) by phone call to the office staff.   Patient agreed to services and verbal consent obtained.   Follow up plan:   Lauretta Grill [employer] Scheduler

## 2021-02-06 ENCOUNTER — Other Ambulatory Visit: Payer: Self-pay | Admitting: Cardiovascular Disease

## 2021-02-06 NOTE — Telephone Encounter (Signed)
03/14/2020 Discharge Medication List         STOP taking these medications       Bystolic 10 MG tablet Generic drug: nebivolol    glipiZIDE 5 MG 24 hr tablet Commonly known as: GLUCOTROL XL    hydrALAZINE 25 MG tablet Commonly known as: APRESOLINE    nitroGLYCERIN 0.4 MG SL tablet Commonly known as: NITROSTAT

## 2021-02-07 ENCOUNTER — Telehealth: Payer: Self-pay | Admitting: Cardiovascular Disease

## 2021-02-07 MED ORDER — HYDRALAZINE HCL 25 MG PO TABS
25.0000 mg | ORAL_TABLET | Freq: Three times a day (TID) | ORAL | 3 refills | Status: DC
Start: 2021-02-07 — End: 2022-02-02

## 2021-02-07 NOTE — Telephone Encounter (Signed)
Called patient regarding hydralazine Rx. She states she has been taking hydralazine 25 mg TID for as long as she can remember. She does not take hydroxyzine due to interaction with another medication. She reports BP readings of 782'N systolic over 56-21 mmHg diastolic while on hydralazine. She denies dizziness, lightheadedness, or unsteadiness. I advised that I will send the refill of her hydralazine and for her to call back with additional questions or concerns. She verbalized understanding and thanked me for the call.

## 2021-02-07 NOTE — Telephone Encounter (Signed)
    Pt c/o medication issue:  1. Name of Medication: hydralazin  25 mg  2. How are you currently taking this medication (dosage and times per day)? 3x a day  3. Are you having a reaction (difficulty breathing--STAT)?   4. What is your medication issue? Pt said she needs refill of this medication but its not on her meds list. She said to send it to [facility]

## 2021-02-07 NOTE — Telephone Encounter (Signed)
Per pt has been taking Hydralazine 25 mg tid Reviewed pt's chart and was on list 11/14/19 and when was seen 10/24/19 fell off list Will forward to Dr Acie Fredrickson for review and recommendations ./cy

## 2021-02-11 ENCOUNTER — Telehealth: Payer: Self-pay

## 2021-02-11 NOTE — Chronic Care Management (AMB) (Signed)
Chronic Care Management Pharmacy Assistant   Name: [name]  MRN: [id number] DOB: [birth year]  [name] is an 70 y.o. year old female who presents for his initial CCM visit with the clinical pharmacist.  Recent office visits:  10/09/20-Aleksei V. [name], MD (PCP) 3 Month follow up visit. Follow up in 6 months.  Recent consult visits:  01/02/21-Cristina Cruzita Lederer, MD (Endocrinology) Diabetic follow up. Labs ordered. Follow up in 4 months.  10/23/20-Phillip Deboraha Sprang, MD (Cardiology) Seen for Coronary artery disease and hyperlipidemia. Labs ordered. EKG completed. Follow up in 1 year.  08/29/20-Cristina Cruzita Lederer, MD (Endocrinology) Diabetic follow up. Advised her to increase glipizide for the duration of the hyperglycemia and then start to back off. Restart Crestor 5 mg every other day. Follow up in 4 months.  Hospital visits:  None in previous 6 months  Medications: Outpatient Encounter Medications as of 02/11/2021  Medication Sig   amLODipine (NORVASC) 10 MG tablet TAKE ONE-HALF TABLET BY  MOUTH DAILY   aspirin 81 MG chewable tablet Chew 1 tablet (81 mg total) by mouth daily.   BYSTOLIC 10 MG tablet TAKE 1 TABLET BY MOUTH EVERY DAY   calcium carbonate (OS-CAL) 600 MG TABS tablet Take 600 mg by mouth 2 (two) times daily with a meal.   Cholecalciferol (VITAMIN D3) 50 MCG (2000 UT) capsule Take 1 capsule (2,000 Units total) by mouth daily.   gabapentin (NEURONTIN) 100 MG capsule Take 1 capsule (100 mg total) by mouth 3 (three) times daily.   glipiZIDE (GLUCOTROL XL) 5 MG 24 hr tablet TAKE 1 TABLET BY MOUTH  DAILY WITH BREAKFAST   hydrALAZINE (APRESOLINE) 25 MG tablet Take 1 tablet (25 mg total) by mouth 3 (three) times daily.   HYDROcodone-acetaminophen (NORCO) 7.5-325 MG tablet Take 0.5-1 tablets by mouth every 6 (six) hours as needed for severe pain.   Iron-Vitamin C (IRON 100/C PO) Take by mouth.   lansoprazole (PREVACID) 15 MG capsule Take 1 capsule (15  mg total) by mouth daily at 12 noon.   methylPREDNISolone (MEDROL DOSEPAK) 4 MG TBPK tablet As directed   ondansetron (ZOFRAN) 4 MG tablet Take 1 tablet (4 mg total) by mouth every 8 (eight) hours as needed for nausea or vomiting.   ONETOUCH ULTRA test strip USE TO TEST BLOOD SUGAR TWO TIMES A DAY AS DIRECTED   potassium chloride SA (KLOR-CON) 20 MEQ tablet TAKE 1 TABLET BY MOUTH  DAILY   rosuvastatin (CRESTOR) 5 MG tablet TAKE 1 TABLET BY MOUTH  DAILY AT 6PM   torsemide (DEMADEX) 20 MG tablet Take 1 tablet (20 mg total) by mouth daily.   vitamin B-12 (CYANOCOBALAMIN) 1000 MCG tablet Take 1,000 mcg by mouth daily.   No facility-administered encounter medications on file as of 02/11/2021.   AmLODipine (NORVASC) 10 MG tablet Last filled:02/10/21 90 DS Aspirin 81 MG chewable tablet Last filled:None noted BYSTOLIC 10 MG tablet Last filled:None noted Calcium carbonate (OS-CAL) 600 MG TABS tablet Last filled:None noted Cholecalciferol (VITAMIN D3) 50 MCG (2000 UT) capsule Last filled:None noted Gabapentin (NEURONTIN) 100 MG capsule Last filled:10/20/20 30 DS GlipiZIDE (GLUCOTROL XL) 5 MG 24 hr tablet Last filled:02/10/21 90 DS HydrALAZINE (APRESOLINE) 25 MG tablet Last filled:02/07/21 90 DS HYDROcodone-acetaminophen (NORCO) 7.5-325 MG tablet Last filled:04/11/20 5 DS Iron-Vitamin C (IRON 100/C PO) Last filled:None noted Lansoprazole (PREVACID) 15 MG capsule Last filled:None noted MethylPREDNISolone (MEDROL DOSEPAK) 4 MG TBPK tablet Last filled:01/04/21 6 DS Ondansetron (ZOFRAN) 4 MG tablet Last filled:03/28/19 6 DS Potassium chloride SA (  KLOR-CON) 20 MEQ tablet Last filled:02/10/21 90 DS Rosuvastatin (CRESTOR) 5 MG tablet Last filled:04/14/19 90 DS torsemide (DEMADEX) 20 MG tablet Last filled:04/10/20 90 DS Vitamin B-12 (CYANOCOBALAMIN) 1000 MCG tablet Last filled:None noted   Star Rating Drugs: Rosuvastatin (CRESTOR) 5 MG tablet Last filled:04/14/19 90 DS GlipiZIDE (GLUCOTROL XL) 5 MG 24 hr  tablet Last filled:02/10/21 90 DS   Myriam Elta [place], [place]

## 2021-02-26 NOTE — Progress Notes (Addendum)
Chronic Care Management Pharmacy Note  02/27/2021 Name:  [name] MRN:  [id number] DOB:  Jan 22, 1951  Summary: - Patient reports that she is doing well, has no issues or complaints about any of her medications -Monitoring blood pressure and HR at home at least once daily reports that it has been averaging 120/60-70's with a HR 90-98 bpm, consistent daily weights 182-183lbs  -Monitoring blood sugars at least once daily, in the morning is averaging 120's, in the evening is averaging 130-140, no issues with hypoglycemia  Recommendations/Changes made from today's visit: -Recommending no changes to medications, patient to continue to monitor blood pressure and blood sugars, patient to reach out should BP/ BG elevate from goal, or if she has any issues with hypotension / hypoglycemia    Subjective: [name] is an 70 y.o. year old female who is a primary patient of [name], Evie Lacks, MD.  The CCM team was consulted for assistance with disease management and care coordination needs.    Engaged with patient by telephone for initial visit in response to provider referral for pharmacy case management and/or care coordination services.   Consent to Services:  The patient was given the following information about Chronic Care Management services today, agreed to services, and gave verbal consent: 1. CCM service includes personalized support from designated clinical staff supervised by the primary care provider, including individualized plan of care and coordination with other care providers 2. 24/7 contact phone numbers for assistance for urgent and routine care needs. 3. Service will only be billed when office clinical staff spend 20 minutes or more in a month to coordinate care. 4. Only one practitioner may furnish and bill the service in a calendar month. 5.The patient may stop CCM services at any time (effective at the end of the month) by phone call to the office staff. 6.  The patient will be responsible for cost sharing (co-pay) of up to 20% of the service fee (after annual deductible is met). Patient agreed to services and consent obtained.  Patient Care Team: [name], Evie Lacks, MD as PCP - General Nahser, Wonda Cheng, MD as PCP - Cardiology (Cardiology) Irene Shipper, MD as Consulting Physician (Gastroenterology) Madelon Lips, MD as Consulting Physician (Nephrology) Philemon Kingdom, MD as Consulting Physician (Internal Medicine) Delice Bison, Darnelle Maffucci, [facility] as Pharmacist (Pharmacist)  Recent office visits:  10/09/20-Aleksei V. [name], MD (PCP) 3 Month follow up visit. Follow up in 6 months.   Recent consult visits:  01/02/21-Cristina Cruzita Lederer, MD (Endocrinology) Diabetic follow up. Labs ordered medications continued . Follow up in 4 months.   10/23/20-Phillip Deboraha Sprang, MD (Cardiology) Seen for Coronary artery disease and hyperlipidemia. Labs ordered medications continued . EKG completed. Follow up in 1 year.   08/29/20-Cristina Cruzita Lederer, MD (Endocrinology) Diabetic follow up. Advised her to increase glipizide for the duration of the hyperglycemia and then start to back off. Restart Crestor 5 mg every other day. Follow up in 4 months.   Hospital visits:  None in previous 6 months    Objective:  Lab Results  Component Value Date   CREATININE 2.74 (H) 10/23/2020   BUN 70 (H) 10/23/2020   [name] 19.03 (L) 05/16/2019   GFRNONAA 34 (L) 03/13/2020   GFRAA 39 (L) 03/13/2020   NA 139 10/23/2020   K 4.8 10/23/2020   CALCIUM 9.5 10/23/2020   CO2 21 10/23/2020   GLUCOSE 209 (H) 10/23/2020    Lab Results  Component Value Date/Time   HGBA1C 7.6 (A) 01/02/2021 01:54  PM   HGBA1C 7.0 (A) 08/29/2020 11:09 AM   HGBA1C 8.4 (H) 05/16/2019 11:20 AM   HGBA1C 9.3 (H) 10/26/2017 01:49 PM   FRUCTOSAMINE 342 (H) 05/16/2020 11:31 AM   FRUCTOSAMINE 321 (H) 05/02/2019 01:27 PM   [name] 19.03 (L) 05/16/2019 11:20 AM   [name] 18.29 (L) 01/10/2019 10:18 AM   MICROALBUR  13.9 (H) 10/13/2007 09:47 AM    Last diabetic Eye exam:  Lab Results  Component Value Date/Time   HMDIABEYEEXA Retinopathy (A) 02/08/2016 12:00 AM    Last diabetic Foot exam:  No results found for: HMDIABFOOTEX   Lab Results  Component Value Date   CHOL 130 10/23/2020   HDL 49 10/23/2020   LDLCALC 70 10/23/2020   TRIG 47 10/23/2020   CHOLHDL 2.7 10/23/2020    Hepatic Function Latest Ref Rng & Units 10/23/2020 01/10/2019 03/29/2018  Total Protein 6.0 - 8.3 g/dL - 7.8 6.6  Albumin 3.5 - 5.2 g/dL - 4.4 4.2  AST 0 - 37 U/L - 12 12  ALT 0 - 32 IU/L '9 12 10  ' Alk Phosphatase 39 - 117 U/L - 112 134(H)  Total Bilirubin 0.2 - 1.2 mg/dL - 0.4 0.2  Bilirubin, Direct 0.0 - 0.3 mg/dL - 0.0 0.09    Lab Results  Component Value Date/Time   TSH 2.17 01/10/2020 10:57 AM   TSH 1.88 01/10/2019 10:18 AM    CBC Latest Ref Rng & Units 03/13/2020 03/12/2020 03/11/2020  WBC 4.0 - 10.5 K/uL 13.3(H) - 13.9(H)  Hemoglobin 12.0 - 15.0 g/dL 8.7(L) 8.9(L) 6.6(LL)  Hematocrit 36.0 - 46.0 % 27.8(L) 27.9(L) 21.9(L)  Platelets 150 - 400 K/uL 242 - 184    Lab Results  Component Value Date/Time   VD25OH 33 05/06/2010 06:50 PM    Clinical ASCVD: Yes  The ASCVD Risk score Mikey Bussing DC Jr., et al., 2013) failed to calculate for the following reasons:   The patient has a prior MI or stroke diagnosis    Depression screen [facility] 2/9 10/09/2020 10/09/2020 05/16/2019  Decreased Interest 0 0 0  Down, Depressed, Hopeless 0 0 0  PHQ - 2 Score 0 0 0  Altered sleeping 0 - -  Tired, decreased energy 0 - -  Change in appetite 0 - -  Feeling bad or failure about yourself  0 - -  Trouble concentrating 0 - -  Moving slowly or fidgety/restless 0 - -  Suicidal thoughts 0 - -  PHQ-9 Score 0 - -     Social History   Tobacco Use  Smoking Status Light Smoker   Types: Cigarettes  Smokeless Tobacco Never   BP Readings from Last 3 Encounters:  01/02/21 122/70  10/23/20 (!) 124/58  10/09/20 128/68   Pulse Readings from Last  3 Encounters:  01/02/21 75  10/23/20 72  10/09/20 75   Wt Readings from Last 3 Encounters:  01/02/21 188 lb (85.3 kg)  10/23/20 187 lb 9.6 oz (85.1 kg)  10/09/20 188 lb 12.8 oz (85.6 kg)   BMI Readings from Last 3 Encounters:  01/02/21 34.39 kg/m  10/23/20 34.31 kg/m  10/09/20 34.53 kg/m    Assessment/Interventions: Review of patient past medical history, allergies, medications, health status, including review of consultants reports, laboratory and other test data, was performed as part of comprehensive evaluation and provision of chronic care management services.   SDOH:  (Social Determinants of Health) assessments and interventions performed: Yes  SDOH Screenings   Alcohol Screen: Not on file  Depression (NUU7-2): Low Risk  PHQ-2 Score: 0  Financial Resource Strain: Low Risk    Difficulty of Paying Living Expenses: Not hard at all  Food Insecurity: Not on file  Housing: Not on file  Physical Activity: Not on file  Social Connections: Not on file  Stress: Not on file  Tobacco Use: High Risk   Smoking Tobacco Use: Light Smoker   Smokeless Tobacco Use: Never  Transportation Needs: Not on file    [place]  Allergies  Allergen Reactions   Tradjenta [Linagliptin] Anaphylaxis    CP   Atorvastatin     REACTION: aches and pains   Enalapril Maleate     REACTION: cough   Farxiga [Dapagliflozin] Itching   Hydrochlorothiazide     REACTION: hair loss   Kenalog [Triamcinolone Acetonide]     HANDS NUMB    Levaquin [Levofloxacin] Hives   Metformin And Related     Diarrhea, dizziness   Propoxyphene N-Acetaminophen Hives   Simvastatin     REACTION: cramps   Spironolactone     REACTION: cramps    Medications Reviewed Today     Reviewed by Tomasa Blase, [facility] (Pharmacist) on 02/27/21 at 1026  Med List Status: <None>   Medication Order Taking? Sig Documenting Provider Last Dose Status Informant  acetaminophen (TYLENOL) 325 MG tablet [id number] Yes Take 325  mg by mouth every 6 (six) hours as needed. [provider] Taking Active   amLODipine (NORVASC) 10 MG tablet [id number] Yes TAKE ONE-HALF TABLET BY  MOUTH DAILY  Patient taking differently: Take 5 mg by mouth daily.   [name], Evie Lacks, MD Taking Active   Ascorbic Acid (VITAMIN C) 1000 MG tablet [id number] Yes Take 1,000 mg by mouth daily. [provider] Taking Active   aspirin 81 MG chewable tablet [id number] Yes Chew 1 tablet (81 mg total) by mouth daily. Erick Colace, NP Taking Active Self  BYSTOLIC 10 MG tablet [id number] Yes TAKE 1 TABLET BY MOUTH EVERY DAY [name], Evie Lacks, MD Taking Active   calcium carbonate (OS-CAL) 600 MG TABS tablet [id number] Yes Take 600 mg by mouth 2 (two) times daily with a meal. [provider] Taking Active Self  Cholecalciferol (VITAMIN D3) 50 MCG (2000 UT) capsule [id number] Yes Take 1 capsule (2,000 Units total) by mouth daily. [name], Evie Lacks, MD Taking Active Self  ferrous sulfate 325 (65 FE) MG tablet [id number] Yes Take 325 mg by mouth 2 (two) times daily with a meal. [provider] Taking Active   glipiZIDE (GLUCOTROL XL) 5 MG 24 hr tablet [id number] Yes TAKE 1 TABLET BY MOUTH  DAILY WITH Nyoka Cowden, MD Taking Active   hydrALAZINE (APRESOLINE) 25 MG tablet [id number] Yes Take 1 tablet (25 mg total) by mouth 3 (three) times daily. Nahser, Wonda Cheng, MD Taking Active   lansoprazole (PREVACID) 15 MG capsule [id number] No Take 1 capsule (15 mg total) by mouth daily at 12 noon.  Patient not taking: Reported on 02/27/2021   [name], Evie Lacks, MD Not Taking Active   ondansetron (ZOFRAN) 4 MG tablet [id number] No Take 1 tablet (4 mg total) by mouth every 8 (eight) hours as needed for nausea or vomiting.  Patient not taking: Reported on 02/27/2021   [name], Evie Lacks, MD Not Taking Active Self  Donald Siva test strip [id number] Yes USE TO TEST BLOOD SUGAR TWO TIMES A DAY AS DIRECTED [name], Evie Lacks, MD Taking Active   potassium chloride SA (KLOR-CON) 20 MEQ tablet [id number] Yes TAKE 1 TABLET  BY MOUTH  DAILY [name], Evie Lacks, MD Taking Active   rosuvastatin (CRESTOR) 5 MG tablet [id number] Yes TAKE 1 TABLET BY MOUTH  DAILY AT 6PM  Patient taking differently: Take 5 mg by mouth every other day.   [name], Evie Lacks, MD Taking Active   torsemide (DEMADEX) 100 MG tablet [id number] Yes Take 50-100 mg by mouth daily. [provider] Taking Active   vitamin B-12 (CYANOCOBALAMIN) 1000 MCG tablet [id number] Yes Take 1,000 mcg by mouth daily. [provider] Taking Active Self            Patient Active Problem List   Diagnosis Date Noted   Atherosclerosis of aorta ([place]) 10/21/2020   Right groin wound, subsequent encounter 05/02/2020   Abdominal wall abscess 05/02/2020   Wounds, multiple 04/10/2020   Hyperglycemia 03/06/2020   Acute lower UTI 03/06/2020   Cellulitis 03/06/2020   DKA, type 2 ([place]) 03/06/2020   Sepsis ([place]) 03/06/2020   Vaginal cysts 08/03/2018   Hypertensive heart disease with congestive heart failure ([place]) 05/11/2018   Boils 04/27/2018   Edema 03/17/2017   Rash and nonspecific skin eruption 12/08/2016   CKD stage 4 due to type 2 diabetes mellitus ([place]) 09/24/2016   Cough 04/13/2016   Left shoulder pain 09/16/2015   Allergic rhinitis 09/16/2015   Goiter 10/19/2014   Low back pain 11/13/2013   Dizziness 11/13/2013   Pruritus 08/01/2013   Well adult exam 08/01/2013   GERD (gastroesophageal reflux disease) 08/01/2013   CAD (coronary artery disease) 06/11/2013   Chronic combined systolic and diastolic CHF (congestive heart failure) ([place]) 06/11/2013   Hypertension associated with diabetes ([place]) 06/07/2013   Personal history of colon cancer    Hyperlipidemia    Obesity    Disorder resulting from impaired renal function 12/19/2008   LEG PAIN, BILATERAL 09/11/2008   Acute sinusitis 06/13/2008   COPD mixed type ([place]) 02/09/2008   Poorly  controlled type 2 diabetes mellitus with circulatory disorder ([place]) 10/13/2007   Iron deficiency anemia 10/13/2007   TOBACCO USE DISORDER/SMOKER-SMOKING CESSATION DISCUSSED 10/13/2007   WEIGHT LOSS, ABNORMAL 10/13/2007   History of malignant neoplasm of large intestine 10/13/2007    There is no immunization history for the selected administration types on file for this patient.  Conditions to be addressed/monitored:  Hypertension, Hyperlipidemia, Diabetes, Heart Failure, GERD, Chronic Kidney Disease, and Gout  Care Plan : CCM Care Plan  Updates made by Tomasa Blase, RPH since 02/27/2021 12:00 AM     Problem: HTN, HF, DM2, CKD, Gout, GERD   Priority: High  Onset Date: 02/27/2021     Long-Range Goal: Disease Management   Start Date: 02/27/2021  Expected End Date: 08/30/2021  This Visit's Progress: On track  Priority: High  Note:   Current Barriers:  Unable to independently monitor therapeutic efficacy  Pharmacist Clinical Goal(s):  Patient will maintain control of A1c, BP, and LDL as evidenced by blood sugar/ blood pressure and heart rate logs, and next lipid panel  through collaboration with PharmD and provider.   Interventions: 1:1 collaboration with [name], Evie Lacks, MD regarding development and update of comprehensive plan of care as evidenced by provider attestation and co-signature Inter-disciplinary care team collaboration (see longitudinal plan of care) Comprehensive medication review performed; medication list updated in electronic medical record  Hyperlipidemia / CAD /Previous NSTEMI: (LDL goal < 70) -Controlled Lab Results  Component Value Date   LDLCALC 70 10/23/2020  -Current treatment: Rosuvastatin 4m - 1 tablet every other day  Aspirin 853m-  1 tablet daily  -Medications previously tried: simvastatin  -Current dietary patterns: reports that she is trying to limit red meat /  high cholesterol food intake -Current exercise habits: walk around the  building for about 30 minutes every other day  -Educated on Cholesterol goals;  Benefits of statin for ASCVD risk reduction; Importance of limiting foods high in cholesterol; Exercise goal of 150 minutes per week; -Counseled on diet and exercise extensively Recommended to continue current medication  Diabetes (A1c goal <8%) -Controlled Lab Results  Component Value Date   HGBA1C 7.6 (A) 01/02/2021  -Current medications: Glipizide XL 11m - 1 tablet daily  -Medications previously tried: farxiga, glimepiride, novolog, tradjenta, metformin XR, metformin, pioglitazone, janumet  -Current home glucose readings fasting glucose: 110-120 in the AM, and in the PM averaging 130-140 -Denies hypoglycemic/hyperglycemic symptoms -Current meal patterns:  breakfast: scrambled eggs / boiled egg, with bacon, toast, at times can be cereal  lunch: does not typically eat lunch  dinner: chef salad, fruit bowl, pasta salad snacks: popcorn drinks: water / sugar free lemonade  -Current exercise: walking every other day around the building for about 30 minutes at a time  -Educated on A1c and blood sugar goals; Complications of diabetes including kidney damage, retinal damage, and cardiovascular disease; Exercise goal of 150 minutes per week; Benefits of weight loss; Prevention and management of hypoglycemic episodes; Benefits of routine self-monitoring of blood sugar; -Counseled to check feet daily and get yearly eye exams -Counseled on diet and exercise extensively Recommended to continue current medication  Heart Failure (Goal: manage symptoms and prevent exacerbations)/ Hypertension (BP goal <130/80) -Controlled -Last ejection fraction: 60-65% (Date: 05/23/2019) -HF type: Combined Systolic and Diastolic -Current treatment: Amlodipine 132m- 1/2 tablet daily  Nebivolol (Bystolic) 1005RT 1 tablet daily  Torsemide 10061m 1/2-1 tablet daily - based on  swelling - leg in particular Potassium Chloride  36m60m 1 tablet daily  Last potassium level 4.8mmo58m (10/23/2020) Hydralazine 25mg 37mtablet 3 times daily  -Medications previously tried: valsartan, losartan, irbesartan, carvedilol  -Current home BP/HR readings: this AM 128/76 HR of 98, usually averaging 120's/ 60-70 with HR 90-98 -Current dietary habits: reports to following low sodium diet  -Current exercise habits: walking every other day around the building for 30 minutes at a time  -Educated on Benefits of medications for managing symptoms and prolonging life Importance of weighing daily; if you gain more than 3 pounds in one day or 5 pounds in one week, to reach out to provider  Proper diuretic administration and potassium supplementation Importance of blood pressure control -Counseled on diet and exercise extensively Recommended to continue current medication  GERD (Goal: Prevention of Acid reflux/ heartburn) -Controlled -Current treatment  Lansoprazole 15mg -58mapsule daily - has not started at this time  -Medications previously tried: omeprazole, ranitidine,   -Counseled on diet and exercise extensively  Gout Goal: Prevention of gout attacks) -Controlled -Current treatment  N/a  -Medications previously tried: medrol dosepak for flares  -Counseled on diet and exercise extensively -Discussed with patient foods that can exacerbate gout flares and avoidance of these foods, patient reports since watching her diet and reducing red meat consumption she has not had a flare since June, possible if needed in the future if diet control does not keep gout under control, can recommend for use of allopurinol for prophylaxis    Chronic Kidney Disease (Goal: Prevention of disease progression) -Controlled -Last eGFR: 18mL/mi69m/20/22) -Last CrCl: 25.53 mL/min  -Current treatment  Avoidance of nephrotoxic agents / adequate blood pressure and blood sugar control to prevent damage to kidneys  -Recommended to continue current  medication  Health Maintenance -Vaccine gaps: COVID Vaccine, Tdap, Shingles, Pneumonia, Influenza  -Current therapy:  Vitamin B12 1042mg - 1 tablet daily  Calcium Carbonate 6081m- 1 tablet twice daily  Vitamin D3 2000units - 1 capsule daily  Iron 32561m 1 tablet twice daily  Vitamin C 1000m24m1 tablet daily  Acetaminophen 325mg56m tablet every 6 hours as needed  Ondansetron 4mg- 77mablet every 8 hours as needed  -Educated on Cost vs benefit of each product must be carefully weighed by individual consumer -Patient is satisfied with current therapy and denies issues -Recommended to continue current medication  Patient Goals/Self-Care Activities Patient will:  - take medications as prescribed check glucose daily, document, and provide at future appointments check blood pressure daily, document, and provide at future appointments weigh daily, and contact provider if weight gain of >3lbs in a day or >5 lbs in a week target a minimum of 150 minutes of moderate intensity exercise weekly  Follow Up Plan: Telephone follow up appointment with care management team member scheduled for: The patient has been provided with contact information for the care management team and has been advised to call with any health related questions or concerns.        Medication Assistance: None required.  Patient affirms current coverage meets needs.  Patient's preferred pharmacy is:  [id number] [place] - [id number] [phone]  [address] [address] [id number] [phone] pill box? Yes Pt endorses 100% compliance  Care Plan and Follow Up Patient Decision:  Patient agrees to Care Plan and Follow-up.  Plan: Telephone follow up appointment with care management team member  scheduled for:  3 months and The patient has been provided with contact information for the care management team and has been advised to call with any health related questions or concerns.   Rayn Enderson Tomasa BlaseD Clinical Pharmacist, [place] examination/treatment/procedure(s) were performed by non-physician practitioner and as supervising physician I was immediately available for consultation/collaboration.  I agree with above. AlekseiLew Dawes

## 2021-02-27 ENCOUNTER — Ambulatory Visit (INDEPENDENT_AMBULATORY_CARE_PROVIDER_SITE_OTHER): Payer: Medicare Other

## 2021-02-27 ENCOUNTER — Other Ambulatory Visit: Payer: Self-pay

## 2021-02-27 DIAGNOSIS — E1159 Type 2 diabetes mellitus with other circulatory complications: Secondary | ICD-10-CM | POA: Diagnosis not present

## 2021-02-27 DIAGNOSIS — E11319 Type 2 diabetes mellitus with unspecified diabetic retinopathy without macular edema: Secondary | ICD-10-CM | POA: Diagnosis not present

## 2021-02-27 DIAGNOSIS — E1165 Type 2 diabetes mellitus with hyperglycemia: Secondary | ICD-10-CM | POA: Diagnosis not present

## 2021-02-27 DIAGNOSIS — N184 Chronic kidney disease, stage 4 (severe): Secondary | ICD-10-CM | POA: Diagnosis not present

## 2021-02-27 DIAGNOSIS — I152 Hypertension secondary to endocrine disorders: Secondary | ICD-10-CM | POA: Diagnosis not present

## 2021-02-27 DIAGNOSIS — E785 Hyperlipidemia, unspecified: Secondary | ICD-10-CM | POA: Diagnosis not present

## 2021-02-27 DIAGNOSIS — E1122 Type 2 diabetes mellitus with diabetic chronic kidney disease: Secondary | ICD-10-CM | POA: Diagnosis not present

## 2021-02-27 DIAGNOSIS — I5042 Chronic combined systolic (congestive) and diastolic (congestive) heart failure: Secondary | ICD-10-CM | POA: Diagnosis not present

## 2021-02-27 DIAGNOSIS — IMO0002 Reserved for concepts with insufficient information to code with codable children: Secondary | ICD-10-CM

## 2021-02-27 NOTE — Patient Instructions (Signed)
Visit Information   PATIENT GOALS:   Goals Addressed             This Visit's Progress    Monitor and Manage My Blood Sugar-Diabetes Type 2       Timeframe:  Long-Range Goal Priority:  High Start Date: 02/27/2021                            Expected End Date:  08/30/2021                     Follow Up Date 05/30/2021   - check blood sugar at prescribed times - check blood sugar before and after exercise - check blood sugar if I feel it is too high or too low - take the blood sugar log to all doctor visits - take the blood sugar meter to all doctor visits    Why is this important?   Checking your blood sugar at home helps to keep it from getting very high or very low.  Writing the results in a diary or log helps the doctor know how to care for you.  Your blood sugar log should have the time, date and the results.  Also, write down the amount of insulin or other medicine that you take.  Other information, like what you ate, exercise done and how you were feeling, will also be helpful.        Track and Manage Symptoms-Heart Failure       Timeframe:  Long-Range Goal Priority:  High Start Date:   02/27/2021                         Expected End Date:  08/30/2021                     Follow Up Date 05/30/2021   - begin a heart failure diary - eat more whole grains, fruits and vegetables, lean meats and healthy fats - know when to call the doctor - dress right for the weather, hot or cold  -Check blood pressure / heart rate at least once daily   Why is this important?   You will be able to handle your symptoms better if you keep track of them.  Making some simple changes to your lifestyle will help.  Eating healthy is one thing you can do to take good care of yourself.          Consent to CCM Services: [name] was given information about Chronic Care Management services including:  CCM service includes personalized support from designated clinical staff supervised by  her physician, including individualized plan of care and coordination with other care providers 24/7 contact phone numbers for assistance for urgent and routine care needs. Service will only be billed when office clinical staff spend 20 minutes or more in a month to coordinate care. Only one practitioner may furnish and bill the service in a calendar month. The patient may stop CCM services at any time (effective at the end of the month) by phone call to the office staff. The patient will be responsible for cost sharing (co-pay) of up to 20% of the service fee (after annual deductible is met).  Patient agreed to services and verbal consent obtained.   The patient verbalized understanding of instructions, educational materials, and care plan provided today and declined offer to receive copy of patient instructions,   educational materials, and care plan.   Telephone follow up appointment with care management team member scheduled for: 3 months The patient has been provided with contact information for the care management team and has been advised to call with any health related questions or concerns.   Daniel C Szabat, PharmD Clinical Pharmacist, [facility]   CLINICAL CARE PLAN: Patient Care Plan: CCM Care Plan     Problem Identified: HTN, HF, DM2, CKD, Gout, GERD   Priority: High  Onset Date: 02/27/2021     Long-Range Goal: Disease Management   Start Date: 02/27/2021  Expected End Date: 08/30/2021  This Visit's Progress: On track  Priority: High  Note:   Current Barriers:  Unable to independently monitor therapeutic efficacy  Pharmacist Clinical Goal(s):  Patient will maintain control of A1c, BP, and LDL as evidenced by blood sugar/ blood pressure and heart rate logs, and next lipid panel  through collaboration with PharmD and provider.   Interventions: 1:1 collaboration with [name], Aleksei V, MD regarding development and update of comprehensive plan of care as  evidenced by provider attestation and co-signature Inter-disciplinary care team collaboration (see longitudinal plan of care) Comprehensive medication review performed; medication list updated in electronic medical record  Hyperlipidemia / CAD /Previous NSTEMI: (LDL goal < 70) -Controlled Lab Results  Component Value Date   LDLCALC 70 10/23/2020  -Current treatment: Rosuvastatin 5mg - 1 tablet every other day  Aspirin 81mg - 1 tablet daily  -Medications previously tried: simvastatin  -Current dietary patterns: reports that she is trying to limit red meat /  high cholesterol food intake -Current exercise habits: walk around the building for about 30 minutes every other day  -Educated on Cholesterol goals;  Benefits of statin for ASCVD risk reduction; Importance of limiting foods high in cholesterol; Exercise goal of 150 minutes per week; -Counseled on diet and exercise extensively Recommended to continue current medication  Diabetes (A1c goal <8%) -Controlled Lab Results  Component Value Date   HGBA1C 7.6 (A) 01/02/2021  -Current medications: Glipizide XL 5mg - 1 tablet daily  -Medications previously tried: farxiga, glimepiride, novolog, tradjenta, metformin XR, metformin, pioglitazone, janumet  -Current home glucose readings fasting glucose: 110-120 in the AM, and in the PM averaging 130-140 -Denies hypoglycemic/hyperglycemic symptoms -Current meal patterns:  breakfast: scrambled eggs / boiled egg, with bacon, toast, at times can be cereal  lunch: does not typically eat lunch  dinner: chef salad, fruit bowl, pasta salad snacks: popcorn drinks: water / sugar free lemonade  -Current exercise: walking every other day around the building for about 30 minutes at a time  -Educated on A1c and blood sugar goals; Complications of diabetes including kidney damage, retinal damage, and cardiovascular disease; Exercise goal of 150 minutes per week; Benefits of weight loss; Prevention  and management of hypoglycemic episodes; Benefits of routine self-monitoring of blood sugar; -Counseled to check feet daily and get yearly eye exams -Counseled on diet and exercise extensively Recommended to continue current medication  Heart Failure (Goal: manage symptoms and prevent exacerbations)/ Hypertension (BP goal <130/80) -Controlled -Last ejection fraction: 60-65% (Date: 05/23/2019) -HF type: Combined Systolic and Diastolic -Current treatment: Amlodipine 10mg - 1/2 tablet daily  Nebivolol (Bystolic) 10mg - 1 tablet daily  Torsemide 100mg - 1/2-1 tablet daily - based on  swelling - leg in particular Potassium Chloride 20mEq - 1 tablet daily  Last potassium level 4.8mmol/L (10/23/2020) Hydralazine 25mg - 1 tablet 3 times daily  -Medications previously tried: valsartan, losartan, irbesartan, carvedilol  -Current   home BP/HR readings: this AM 128/76 HR of 98, usually averaging 120's/ 60-70 with HR 90-98 -Current dietary habits: reports to following low sodium diet  -Current exercise habits: walking every other day around the building for 30 minutes at a time  -Educated on Benefits of medications for managing symptoms and prolonging life Importance of weighing daily; if you gain more than 3 pounds in one day or 5 pounds in one week, to reach out to provider  Proper diuretic administration and potassium supplementation Importance of blood pressure control -Counseled on diet and exercise extensively Recommended to continue current medication  GERD (Goal: Prevention of Acid reflux/ heartburn) -Controlled -Current treatment  Lansoprazole 79m - 1 capsule daily - has not started at this time  -Medications previously tried: omeprazole, ranitidine,   -Counseled on diet and exercise extensively  Gout Goal: Prevention of gout attacks) -Controlled -Current treatment  N/a  -Medications previously tried: medrol dosepak for flares  -Counseled on diet and exercise extensively -Discussed  with patient foods that can exacerbate gout flares and avoidance of these foods, patient reports since watching her diet and reducing red meat consumption she has not had a flare since June, possible if needed in the future if diet control does not keep gout under control, can recommend for use of allopurinol for prophylaxis    Chronic Kidney Disease (Goal: Prevention of disease progression) -Controlled -Last eGFR: 164mmin (10/23/20) -Last CrCl: 25.53 mL/min  -Current treatment  Avoidance of nephrotoxic agents / adequate blood pressure and blood sugar control to prevent damage to kidneys  -Recommended to continue current medication  Health Maintenance -Vaccine gaps: COVID Vaccine, Tdap, Shingles, Pneumonia, Influenza  -Current therapy:  Vitamin B12 100087m- 1 tablet daily  Calcium Carbonate 600m73m1 tablet twice daily  Vitamin D3 2000units - 1 capsule daily  Iron 325mg63m tablet twice daily  Vitamin C 1000mg 75mtablet daily  Acetaminophen 325mg -36mablet every 6 hours as needed  Ondansetron 4mg- 1 66mlet every 8 hours as needed  -Educated on Cost vs benefit of each product must be carefully weighed by individual consumer -Patient is satisfied with current therapy and denies issues -Recommended to continue current medication  Patient Goals/Self-Care Activities Patient will:  - take medications as prescribed check glucose daily, document, and provide at future appointments check blood pressure daily, document, and provide at future appointments weigh daily, and contact provider if weight gain of >3lbs in a day or >5 lbs in a week target a minimum of 150 minutes of moderate intensity exercise weekly  Follow Up Plan: Telephone follow up appointment with care management team member scheduled for: The patient has been provided with contact information for the care management team and has been advised to call with any health related questions or concerns.

## 2021-03-04 ENCOUNTER — Other Ambulatory Visit: Payer: Self-pay | Admitting: Internal Medicine

## 2021-03-24 DIAGNOSIS — H3581 Retinal edema: Secondary | ICD-10-CM | POA: Diagnosis not present

## 2021-03-24 DIAGNOSIS — H25813 Combined forms of age-related cataract, bilateral: Secondary | ICD-10-CM | POA: Diagnosis not present

## 2021-03-24 DIAGNOSIS — H3582 Retinal ischemia: Secondary | ICD-10-CM | POA: Diagnosis not present

## 2021-03-24 DIAGNOSIS — E113513 Type 2 diabetes mellitus with proliferative diabetic retinopathy with macular edema, bilateral: Secondary | ICD-10-CM | POA: Diagnosis not present

## 2021-03-24 DIAGNOSIS — H31093 Other chorioretinal scars, bilateral: Secondary | ICD-10-CM | POA: Diagnosis not present

## 2021-04-05 ENCOUNTER — Other Ambulatory Visit: Payer: Self-pay | Admitting: Internal Medicine

## 2021-04-08 DIAGNOSIS — E113511 Type 2 diabetes mellitus with proliferative diabetic retinopathy with macular edema, right eye: Secondary | ICD-10-CM | POA: Diagnosis not present

## 2021-04-09 ENCOUNTER — Other Ambulatory Visit: Payer: Self-pay

## 2021-04-10 ENCOUNTER — Encounter: Payer: Self-pay | Admitting: Internal Medicine

## 2021-04-10 ENCOUNTER — Ambulatory Visit (INDEPENDENT_AMBULATORY_CARE_PROVIDER_SITE_OTHER): Payer: Medicare Other | Admitting: Internal Medicine

## 2021-04-10 DIAGNOSIS — E785 Hyperlipidemia, unspecified: Secondary | ICD-10-CM

## 2021-04-10 DIAGNOSIS — E1159 Type 2 diabetes mellitus with other circulatory complications: Secondary | ICD-10-CM | POA: Diagnosis not present

## 2021-04-10 DIAGNOSIS — M5440 Lumbago with sciatica, unspecified side: Secondary | ICD-10-CM

## 2021-04-10 DIAGNOSIS — E1165 Type 2 diabetes mellitus with hyperglycemia: Secondary | ICD-10-CM | POA: Diagnosis not present

## 2021-04-10 DIAGNOSIS — G8929 Other chronic pain: Secondary | ICD-10-CM

## 2021-04-10 MED ORDER — HYDROCODONE-ACETAMINOPHEN 5-325 MG PO TABS: 1.0000 | ORAL_TABLET | Freq: Four times a day (QID) | ORAL | 0 refills | Status: DC | PRN

## 2021-04-10 MED ORDER — ROSUVASTATIN CALCIUM 5 MG PO TABS: 5.0000 mg | ORAL_TABLET | Freq: Every day | ORAL | 3 refills | Status: DC

## 2021-04-10 NOTE — Progress Notes (Signed)
Subjective:  Patient ID: Maraya Gwilliam, female    DOB: 01/10/1951  Age: 70 y.o. MRN: [id number]  CC: Follow-up (6 months f/u)   HPI Krystyna Cleckley presents for LBP x 1 month after lifting 24 case of water, irrad to the L hip (7/10 ) - ES tylenol helps a little. C/o constipation on caltrate F/u on dyslipidemia - ran out of Crestor  Outpatient Medications Prior to Visit  Medication Sig Dispense Refill   acetaminophen (TYLENOL) 325 MG tablet Take 325 mg by mouth every 6 (six) hours as needed.     amLODipine (NORVASC) 10 MG tablet TAKE ONE-HALF TABLET BY  MOUTH DAILY (Patient taking differently: Take 5 mg by mouth daily.) 45 tablet 3   Ascorbic Acid (VITAMIN C) 1000 MG tablet Take 1,000 mg by mouth daily.     aspirin 81 MG chewable tablet Chew 1 tablet (81 mg total) by mouth daily.     BYSTOLIC 10 MG tablet TAKE 1 TABLET BY MOUTH EVERY DAY 90 tablet 3   calcium carbonate (OS-CAL) 600 MG TABS tablet Take 600 mg by mouth 2 (two) times daily with a meal.     Cholecalciferol (VITAMIN D3) 50 MCG (2000 UT) capsule Take 1 capsule (2,000 Units total) by mouth daily. 100 capsule 3   ferrous sulfate 325 (65 FE) MG tablet Take 325 mg by mouth 2 (two) times daily with a meal.     glipiZIDE (GLUCOTROL XL) 5 MG 24 hr tablet TAKE 1 TABLET BY MOUTH  DAILY WITH BREAKFAST 90 tablet 3   glucose blood (ONETOUCH ULTRA) test strip USE TO TEST BLOOD SUGAR  TWICE DAILY AS DIRECTED 200 strip 3   hydrALAZINE (APRESOLINE) 25 MG tablet Take 1 tablet (25 mg total) by mouth 3 (three) times daily. 270 tablet 3   lansoprazole (PREVACID) 15 MG capsule Take 1 capsule (15 mg total) by mouth daily at 12 noon. 90 capsule 3   ondansetron (ZOFRAN) 4 MG tablet Take 1 tablet (4 mg total) by mouth every 8 (eight) hours as needed for nausea or vomiting. 20 tablet 0   potassium chloride SA (KLOR-CON) 20 MEQ tablet TAKE 1 TABLET BY MOUTH  DAILY 90 tablet 3   torsemide (DEMADEX) 100 MG tablet TAKE 0.5-1 TABLETS (50-100  MG TOTAL) BY MOUTH DAILY. 90 tablet 1   vitamin B-12 (CYANOCOBALAMIN) 1000 MCG tablet Take 1,000 mcg by mouth daily.     rosuvastatin (CRESTOR) 5 MG tablet TAKE 1 TABLET BY MOUTH  DAILY AT 6PM (Patient taking differently: Take 5 mg by mouth every other day.) 90 tablet 3   No facility-administered medications prior to visit.    ROS: Review of Systems  Constitutional:  Positive for fatigue. Negative for activity change, appetite change, chills and unexpected weight change.  HENT:  Negative for congestion, mouth sores and sinus pressure.   Eyes:  Negative for visual disturbance.  Respiratory:  Negative for cough and chest tightness.   Gastrointestinal:  Negative for abdominal pain and nausea.  Genitourinary:  Negative for difficulty urinating, frequency and vaginal pain.  Musculoskeletal:  Positive for back pain and gait problem.  Skin:  Negative for pallor and rash.  Neurological:  Negative for dizziness, tremors, weakness, numbness and headaches.  Psychiatric/Behavioral:  Positive for sleep disturbance. Negative for confusion and suicidal ideas.    Objective:  BP 136/70 (BP Location: Left Arm)   Pulse 75   Temp 98.1 F (36.7 C) (Oral)   Ht 5\' 2"  (1.575 m)  Wt 189 lb 3.2 oz (85.8 kg)   LMP  (LMP Unknown)   SpO2 98%   BMI 34.61 kg/m   BP Readings from Last 3 Encounters:  04/10/21 136/70  01/02/21 122/70  10/23/20 (!) 124/58    Wt Readings from Last 3 Encounters:  04/10/21 189 lb 3.2 oz (85.8 kg)  01/02/21 188 lb (85.3 kg)  10/23/20 187 lb 9.6 oz (85.1 kg)    Physical Exam Constitutional:      General: She is not in acute distress.    Appearance: She is well-developed. She is obese.  HENT:     Head: Normocephalic.     Right Ear: External ear normal.     Left Ear: External ear normal.     Nose: Nose normal.  Eyes:     General:        Right eye: No discharge.        Left eye: No discharge.     Conjunctiva/sclera: Conjunctivae normal.     Pupils: Pupils are equal,  round, and reactive to light.  Neck:     Thyroid: No thyromegaly.     Vascular: No JVD.     Trachea: No tracheal deviation.  Cardiovascular:     Rate and Rhythm: Normal rate and regular rhythm.     Heart sounds: Normal heart sounds.  Pulmonary:     Effort: No respiratory distress.     Breath sounds: No stridor. No wheezing.  Abdominal:     General: Bowel sounds are normal. There is no distension.     Palpations: Abdomen is soft. There is no mass.     Tenderness: There is no abdominal tenderness. There is no guarding or rebound.  Musculoskeletal:        General: Tenderness present.     Cervical back: Normal range of motion and neck supple. No rigidity.  Lymphadenopathy:     Cervical: No cervical adenopathy.  Skin:    Findings: No erythema or rash.  Neurological:     Cranial Nerves: No cranial nerve deficit.     Motor: No abnormal muscle tone.     Coordination: Coordination abnormal.     Gait: Gait abnormal.     Deep Tendon Reflexes: Reflexes normal.  Psychiatric:        Behavior: Behavior normal.        Thought Content: Thought content normal.        Judgment: Judgment normal.   Antalgic gait LL w/pain L>R   Lab Results  Component Value Date   WBC 13.3 (H) 03/13/2020   HGB 8.7 (L) 03/13/2020   HCT 27.8 (L) 03/13/2020   PLT 242 03/13/2020   GLUCOSE 209 (H) 10/23/2020   CHOL 130 10/23/2020   TRIG 47 10/23/2020   HDL 49 10/23/2020   LDLCALC 70 10/23/2020   ALT 9 10/23/2020   AST 12 01/10/2019   NA 139 10/23/2020   K 4.8 10/23/2020   CL 102 10/23/2020   CREATININE 2.74 (H) 10/23/2020   BUN 70 (H) 10/23/2020   CO2 21 10/23/2020   TSH 2.17 01/10/2020   INR 1.1 06/17/2020   HGBA1C 7.6 (A) 01/02/2021   MICROALBUR 13.9 (H) 10/13/2007    CT ABDOMEN PELVIS WO CONTRAST  Result Date: 08/06/2020 CLINICAL DATA:  70 year old female with history of abdominal wall abscess status post percutaneous drain placement on 06/17/2020. Presents for follow-up. EXAM: CT ABDOMEN AND  PELVIS WITHOUT CONTRAST TECHNIQUE: Multidetector CT imaging of the abdomen and pelvis was performed following the standard  protocol without IV contrast. COMPARISON:  06/03/2020, 06/17/2020 FINDINGS: Lower chest: No acute abnormality. Hepatobiliary: No focal liver abnormality is seen. No gallstones, gallbladder wall thickening, or biliary dilatation. Pancreas: Unremarkable. No pancreatic ductal dilatation or surrounding inflammatory changes. Spleen: Normal in size without focal abnormality. Adrenals/Urinary Tract: Adrenal glands are unremarkable. Kidneys are normal, without renal calculi, focal lesion, or hydronephrosis. Bladder is unremarkable. Stomach/Bowel: Stomach is within normal limits. Postsurgical changes after right hemicolectomy without complicating features. Scattered colonic diverticula without surrounding inflammatory changes. No evidence of bowel wall thickening, distention, or inflammatory changes. Vascular/Lymphatic: Similar appearing scattered abdominal aortic atherosclerotic calcifications. No enlarged abdominal or pelvic lymph nodes. Reproductive: Uterus and bilateral adnexa are unremarkable. Other: Interval decreased size of previously visualized right lower anterior abdominal wall fluid collection with unchanged position of indwelling drain. The collection is now immeasurable. There is persistent surrounding fat stranding and anterior lower abdominal wall skin thickening, suggestive of persistent inflammatory process. Musculoskeletal: No acute osseous abnormality. Multilevel degenerative changes of the thoracolumbar spine, most pronounced at L2-L3 and L5-S1. IMPRESSION: 1. Interval resolution of previously visualized right anterior lower abdominal extraperitoneal fluid collection with unchanged position of indwelling pigtail drain. 2. No acute intra-abdominal abnormality. 3.  Aortic Atherosclerosis (ICD10-I70.0). PLAN: IR to remove indwelling percutaneous drain.  Follow-up as needed. Ruthann Cancer, MD [facility] Electronically Signed   By: Ruthann Cancer MD   On: 08/06/2020 12:45   IR Radiologist Eval & Mgmt  Result Date: 08/06/2020 Please refer to notes tab for details about interventional procedure. (Op Note)   Assessment & Plan:   Problem List Items Addressed This Visit     Hyperlipidemia    Re-start Crestor Rx emailed      Relevant Medications   rosuvastatin (CRESTOR) 5 MG tablet   Low back pain    New LBP x 1 month after lifting 24 case of water, irrad to the R hip (7/10 ) - ES tylenol helps a little. Norco prn LOC      Relevant Medications   HYDROcodone-acetaminophen (NORCO) 5-325 MG tablet   Poorly controlled type 2 diabetes mellitus with circulatory disorder (HCC)    CBGs are good at home - on Glipizide      Relevant Medications   rosuvastatin (CRESTOR) 5 MG tablet      Follow-up: Return in about 3 months (around 07/11/2021) for a follow-up visit.  Walker Kehr, MD

## 2021-04-10 NOTE — Assessment & Plan Note (Signed)
New LBP x 1 month after lifting 24 case of water, irrad to the R hip (7/10 ) - ES tylenol helps a little. Norco prn LOC

## 2021-04-10 NOTE — Assessment & Plan Note (Signed)
CBGs are good at home - on Glipizide

## 2021-04-10 NOTE — Assessment & Plan Note (Addendum)
Re-start Crestor Rx emailed Risks associated with treatment noncompliance were discussed. Compliance was encouraged. [name]

## 2021-04-10 NOTE — Patient Instructions (Addendum)
Production designer, theatre/television/film

## 2021-05-05 DIAGNOSIS — E113512 Type 2 diabetes mellitus with proliferative diabetic retinopathy with macular edema, left eye: Secondary | ICD-10-CM | POA: Diagnosis not present

## 2021-05-05 DIAGNOSIS — H3581 Retinal edema: Secondary | ICD-10-CM | POA: Diagnosis not present

## 2021-05-05 DIAGNOSIS — H43812 Vitreous degeneration, left eye: Secondary | ICD-10-CM | POA: Diagnosis not present

## 2021-05-05 DIAGNOSIS — H35032 Hypertensive retinopathy, left eye: Secondary | ICD-10-CM | POA: Diagnosis not present

## 2021-05-05 DIAGNOSIS — H3582 Retinal ischemia: Secondary | ICD-10-CM | POA: Diagnosis not present

## 2021-05-05 DIAGNOSIS — H31092 Other chorioretinal scars, left eye: Secondary | ICD-10-CM | POA: Diagnosis not present

## 2021-05-06 DIAGNOSIS — N184 Chronic kidney disease, stage 4 (severe): Secondary | ICD-10-CM | POA: Diagnosis not present

## 2021-05-06 DIAGNOSIS — I129 Hypertensive chronic kidney disease with stage 1 through stage 4 chronic kidney disease, or unspecified chronic kidney disease: Secondary | ICD-10-CM | POA: Diagnosis not present

## 2021-05-06 DIAGNOSIS — N189 Chronic kidney disease, unspecified: Secondary | ICD-10-CM | POA: Diagnosis not present

## 2021-05-06 DIAGNOSIS — M109 Gout, unspecified: Secondary | ICD-10-CM | POA: Diagnosis not present

## 2021-05-06 DIAGNOSIS — I5189 Other ill-defined heart diseases: Secondary | ICD-10-CM | POA: Diagnosis not present

## 2021-05-06 DIAGNOSIS — E1122 Type 2 diabetes mellitus with diabetic chronic kidney disease: Secondary | ICD-10-CM | POA: Diagnosis not present

## 2021-05-06 DIAGNOSIS — J449 Chronic obstructive pulmonary disease, unspecified: Secondary | ICD-10-CM | POA: Diagnosis not present

## 2021-05-06 DIAGNOSIS — D631 Anemia in chronic kidney disease: Secondary | ICD-10-CM | POA: Diagnosis not present

## 2021-05-08 ENCOUNTER — Other Ambulatory Visit: Payer: Self-pay | Admitting: Internal Medicine

## 2021-05-12 ENCOUNTER — Ambulatory Visit (INDEPENDENT_AMBULATORY_CARE_PROVIDER_SITE_OTHER): Payer: Medicare Other | Admitting: Internal Medicine

## 2021-05-12 ENCOUNTER — Encounter: Payer: Self-pay | Admitting: Internal Medicine

## 2021-05-12 ENCOUNTER — Other Ambulatory Visit: Payer: Self-pay

## 2021-05-12 VITALS — BP 120/60 | HR 78 | Ht 62.0 in | Wt 188.4 lb

## 2021-05-12 DIAGNOSIS — E785 Hyperlipidemia, unspecified: Secondary | ICD-10-CM | POA: Diagnosis not present

## 2021-05-12 DIAGNOSIS — E1165 Type 2 diabetes mellitus with hyperglycemia: Secondary | ICD-10-CM

## 2021-05-12 DIAGNOSIS — E049 Nontoxic goiter, unspecified: Secondary | ICD-10-CM | POA: Diagnosis not present

## 2021-05-12 DIAGNOSIS — E1159 Type 2 diabetes mellitus with other circulatory complications: Secondary | ICD-10-CM | POA: Diagnosis not present

## 2021-05-12 LAB — POCT GLYCOSYLATED HEMOGLOBIN (HGB A1C): Hemoglobin A1C: 7.7 % — AB (ref 4.0–5.6)

## 2021-05-12 NOTE — Progress Notes (Addendum)
Patient ID: [name], female   DOB: October 31, 1950, 70 y.o.   MRN: [id number]  This visit occurred during the SARS-CoV-2 public health emergency.  Safety protocols were in place, including screening questions prior to the visit, additional usage of staff PPE, and extensive cleaning of exam room while observing appropriate contact time as indicated for disinfecting solutions.   HPI: [name] is a 70 y.o.-year-old female, returning for f/u DM2, dx in 2006, insulin-independent, uncontrolled, with complications (CAD - s/p AMI 06/2013, CHF; CKD; PN; DR). Last visit 4 months ago.  Interim history: No increased urination, blurry vision, nausea, chest pain. At last visit, sugars were higher, up to 400s after getting prednisone for left foot gout.  She had another gout attack end of last month >> this time, no steroids, just Allopurinol. She started iron twice a day before our last visit. Drinks diet cranberry juice. Prev. Drinking regular tart cherry syrup >> now capsules.  Reviewed HbA1c levels: Lab Results  Component Value Date   HGBA1C 7.6 (A) 01/02/2021   HGBA1C 7.0 (A) 08/29/2020   HGBA1C 8.3 (A) 05/16/2020  05/16/2020: HbA1c calculated from fructosamine is: 7.4%, lower than the directly measured HbA1c. 05/02/2019: HbA1c calculated from fructosamine was 7.0% 12/30/2018: HbA1c calculated from fructosamine is higher, at 8.2%, possibly 2/2 drinking juice 08/24/2018: HbA1c calculated from fructosamine is higher, at 8%, possibly 2/2 ABx  04/20/2018: HbA1c calculated from fructosamine is slightly better than before, at 7% 12/15/2017: HbA1c calculated from fructosamine: 7.17%, slightly improved from last visit  08/17/2017: HbA1c calculated from fructosamine: 7.2%, slightly improved 04/15/2017: HbA1c calculated from the fructosamine is lower than measured, at 7.3% 08/21/2016: HbA1c 6.0%. She started to change her diet after her hemoglobin A1c returned high, at 16.2% in 09/2014. She  cut down portions, changed the meal content to include more fiber and less carbs.  HbA1c decreased dramatically.  Pt is on: - Glipizide ER 5 mg in a.m.-started 2016 >> 10 mg >> 5 mg before breakfast (10 mg during steroid treatment) She stopped metformin ER in 01/2017. She tried Tradjenta 5 mg daily in am >> CP with it (started 10/2014) >> had to stop She tried [place] >> decreased [name] and yeast inf. She tried regular metformin >> diarrhea She refused insulin in the past.  Pt checks her sugars twice a day per review of her log: - am: 98-136, 142 >> 77, 98-136, 148, 155 >> 87-149, 157, 170, 180 - 2h after b'fast: 122-139 >> 129-144 >> 130-140 >> 114 >> n/c - before lunch: 125 >> 89, 121-151 >> 103-141, 158 >> n/c - 2h after lunch: 169 >> 120-130 >> n/c  - before dinner:  110-144 >> 118-130 >> n/c - 2h after dinner: 128-176 >> 119-147, 155 >> 115-166 >> 119-167 - bedtime: 137 >> n/c >> 147-157 >> n/c - nighttime: n/c Lowest sugar was  68 >> 66  >> 77 >> 87; it is unclear at which level she has hypoglycemia awareness. Highest sugar was 169 >> 156 >> 176 >> 400 (Prednisone) >> 180.  Glucometer: One Touch Ultra  Pt's meals are: - Breakfast: oatmeal, cereals, [place] bacon, egg toast; cinnamon on oatmeal or cheerios - Lunch: sandwich or fruit salad - Dinner: salmon/chicken, Brussel sprouts, other veggies, sweet potatoes or brown rice, spaghetti - Snacks: 2: carrots with dip; yoghurt with fruit, veggies + dip; fruit She walks for exercise. She saw Dr. Hollie Salk with nephrology.  She was started on a high potassium, low protein diet  -+ CKD-sees nephrology;  latest BUN/creatinine: Lab Results  Component Value Date   BUN 70 (H) 10/23/2020   CREATININE 2.74 (H) 10/23/2020  08/09/2020: 68/2.15, [name] 26, glucose 145, protein to creatinine ratio 68, ACR 9 She stopped olmesartan due to worsening kidney function and cough.  -+ HL; last set of lipids: Lab Results  Component Value Date   CHOL 130  10/23/2020   HDL 49 10/23/2020   LDLCALC 70 10/23/2020   TRIG 47 10/23/2020   CHOLHDL 2.7 10/23/2020  On Crestor 5 daily.  - last eye exam was in 01/01/2021: + DR, + cataract reportedly.  Had laser surgery. Dr. Posey Pronto.   - she has numbness and tingling in her feet.  She also has a history of HTN, GERD, and anemia. In 02/2020, she was admitted multiple perineal abscesses and cellulitis.  These were I&D and she was started on antibiotics. She was in [facility] for 2 weeks - she did not like her care there, including the meals. Of note, patient discussed with nephrology about dialysis and she would never want to proceed with this.  Goiter: A thyroid ultrasound from 2016 showed only small cysts.  Pt denies: - feeling nodules in neck - hoarseness - dysphagia - choking - SOB with lying down  Latest TSH normal: Lab Results  Component Value Date   TSH 2.17 01/10/2020   ROS: Constitutional: no weight gain/no weight loss, no fatigue, no subjective hyperthermia, no subjective hypothermia Eyes: no blurry vision, no xerophthalmia ENT: no sore throat, + see HPI Cardiovascular: no CP/no SOB/no palpitations/+ leg swelling Respiratory: no cough/no SOB/no wheezing Gastrointestinal: no N/no V/no D/no C/no acid reflux Musculoskeletal: no muscle aches/+ joint aches Skin: no rashes, no hair loss Neurological: no tremors/+ numbness/+ tingling/no dizziness  I reviewed pt's medications, allergies, PMH, social hx, family hx, and changes were documented in the history of present illness. Otherwise, unchanged from my initial visit note.  Past Medical History:  Diagnosis Date   Anemia    COPD (chronic obstructive pulmonary disease) ([place])    Diabetes mellitus    Hyperlipidemia    Hypertension    Ischemic cardiomyopathy 06/11/2013   Low back pain    NSTEMI (non-ST elevated myocardial infarction) ([place]) 06/11/2013   Obesity    Personal history of colon cancer    Renal insufficiency    Past  Surgical History:  Procedure Laterality Date   COLECTOMY     INCISION AND DRAINAGE PERIRECTAL ABSCESS N/A 03/07/2020   Procedure: IRRIGATION AND DEBRIDEMENT PERINEUM  AND PANNUS ABSCESS;  Surgeon: Erroll Luna, MD;  Location: [facility];  Service: General;  Laterality: N/A;   IR RADIOLOGIST EVAL & MGMT  08/06/2020   LEFT HEART CATHETERIZATION WITH CORONARY ANGIOGRAM N/A 06/09/2013   Procedure: LEFT HEART CATHETERIZATION WITH CORONARY ANGIOGRAM;  Surgeon: Ramond Dial, MD;  Location[facility] C[facility] LAB;  Service: Cardiovascular;  Laterality: N/A;   History   Social History   Marital Status: Single    Spouse Name: N/A   Number of Children: 0   Occupational History   retired   Social History Main Topics   Smoking status:  former smoker     Types: Cigarettes   Smokeless tobacco: Never Used   Alcohol Use: No   Drug Use: No   Current Outpatient Medications on File Prior to Visit  Medication Sig Dispense Refill   acetaminophen (TYLENOL) 325 MG tablet Take 325 mg by mouth every 6 (six) hours as needed.     amLODipine (NORVASC) 10 MG tablet  TAKE ONE-HALF TABLET BY  MOUTH DAILY (Patient taking differently: Take 5 mg by mouth daily.) 45 tablet 3   Ascorbic Acid (VITAMIN C) 1000 MG tablet Take 1,000 mg by mouth daily.     aspirin 81 MG chewable tablet Chew 1 tablet (81 mg total) by mouth daily.     BYSTOLIC 10 MG tablet TAKE 1 TABLET BY MOUTH EVERY DAY 90 tablet 3   calcium carbonate (OS-CAL) 600 MG TABS tablet Take 600 mg by mouth 2 (two) times daily with a meal.     Cholecalciferol (VITAMIN D3) 50 MCG (2000 UT) capsule Take 1 capsule (2,000 Units total) by mouth daily. 100 capsule 3   ferrous sulfate 325 (65 FE) MG tablet Take 325 mg by mouth 2 (two) times daily with a meal.     glipiZIDE (GLUCOTROL XL) 5 MG 24 hr tablet TAKE 1 TABLET BY MOUTH  DAILY WITH BREAKFAST 90 tablet 3   glucose blood (ONETOUCH ULTRA) test strip USE TO TEST BLOOD SUGAR  TWICE DAILY AS DIRECTED 200 strip 3   hydrALAZINE  (APRESOLINE) 25 MG tablet Take 1 tablet (25 mg total) by mouth 3 (three) times daily. 270 tablet 3   HYDROcodone-acetaminophen (NORCO) 5-325 MG tablet Take 1 tablet by mouth every 6 (six) hours as needed for severe pain. 20 tablet 0   lansoprazole (PREVACID) 15 MG capsule Take 1 capsule (15 mg total) by mouth daily at 12 noon. 90 capsule 3   ondansetron (ZOFRAN) 4 MG tablet Take 1 tablet (4 mg total) by mouth every 8 (eight) hours as needed for nausea or vomiting. 20 tablet 0   potassium chloride SA (KLOR-CON) 20 MEQ tablet TAKE 1 TABLET BY MOUTH  DAILY 90 tablet 3   rosuvastatin (CRESTOR) 5 MG tablet Take 1 tablet (5 mg total) by mouth daily. TAKE 1 TABLET BY MOUTH  DAILY AT 6PM 90 tablet 3   torsemide (DEMADEX) 100 MG tablet TAKE 0.5-1 TABLETS (50-100 MG TOTAL) BY MOUTH DAILY. 90 tablet 1   vitamin B-12 (CYANOCOBALAMIN) 1000 MCG tablet Take 1,000 mcg by mouth daily.     No current facility-administered medications on file prior to visit.   Allergies  Allergen Reactions   Tradjenta [Linagliptin] Anaphylaxis    CP   Atorvastatin     REACTION: aches and pains   Enalapril Maleate     REACTION: cough   Farxiga [Dapagliflozin] Itching   Hydrochlorothiazide     REACTION: hair loss   Kenalog [Triamcinolone Acetonide]     HANDS NUMB    Levaquin [Levofloxacin] Hives   Metformin And Related     Diarrhea, dizziness   Propoxyphene N-Acetaminophen Hives   Simvastatin     REACTION: cramps   Spironolactone     REACTION: cramps   Family History  Problem Relation Age of Onset   Hypertension Other    PE: BP 120/60 (BP Location: Right Arm, Patient Position: Sitting, Cuff Size: Normal)   Pulse 78   Ht 5\' 2"  (1.575 m)   Wt 188 lb 6.4 oz (85.5 kg)   LMP  (LMP Unknown)   SpO2 99%   BMI 34.46 kg/m  Body mass index is 34.46 kg/m. Wt Readings from Last 3 Encounters:  05/12/21 188 lb 6.4 oz (85.5 kg)  04/10/21 189 lb 3.2 oz (85.8 kg)  01/02/21 188 lb (85.3 kg)   Constitutional: overweight,  in NAD Eyes: PERRLA, EOMI, no exophthalmos ENT: moist mucous membranes, no thyromegaly, no cervical lymphadenopathy Cardiovascular: RRR, No RG, + 1/6  SEM, + L>R LE edema Respiratory: CTA B Gastrointestinal: abdomen soft, NT, ND, BS+ Musculoskeletal: no deformities, strength intact in all 4 Skin: moist, warm, no rashes Neurological: no tremor with outstretched hands, DTR normal in all 4   ASSESSMENT: 1. DM2, insulin-independent, uncontrolled, with complications - CAD, s/p AMI 06/2013 - Dr Acie Fredrickson - CHF - CKD - PN - DR - Dr. Jalene Mullet ([facility]) - on IO injections >> improving  2. Goiter - No neck compression symptoms  - 10/19/2014: thyroid ultrasound: Right thyroid lobe: 4.0 x 1.5 x 1.6 cm. Heterogeneous parenchyma with multiple small cysts identified. The largest measures approximately 0.5 x 0.3 x 0.5 cm. All of the small cysts in the right lobe appears simple and benign.  Left thyroid lobe: 4.5 x 1.4 x 2.0 cm. Heterogeneous parenchyma with multiple cysts. The largest measures approximately 1.0 x 0.4 x 0.5 cm. 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 Similar smaller cyst measures 0.8 cm in greatest diameter. There is a small solid nodule measuring 0.7 x 0.4 x 0.6 cm.  Isthmus Thickness: 0.4 cm.  No nodules visualized.  Lymphadenopathy: None visualized.            Multicystic thyroid.  3. HL  PLAN:  1. Patient with longstanding, uncontrolled, type 2 diabetes, on extended release sulfonylurea only, with initially improved control after switching to a more plant-based diet with reduced processed foods and sweets.  However, she relaxed her diet afterwards and sugars increased.  She refused insulin.  At last visit, she came after a gout attack for which she had steroids.  Sugars increased to 400s while on steroids.  They have improved before last visit but they were still not back to the previous levels.  We continued the same dose of  glipizide ER at that time but I did advise her that if she got steroids again, we needed to increase the dose to 2 tablets a day (10 mg daily).  HbA1c at last visit was higher, at 7.6%.  Of note, she is on iron therapy so her HbA1c levels may appear to be lower than they actually are.  Also, she is on hydroxyurea which can influence the HbA1c result. -At this visit, per review of her log, sugars are mostly at goal in the morning but she does have some hyperglycemic spikes, even up to 170 and 180.  She also check blood sugars after dinner and these are all at goal.  She tells me that she was drinking regular tart cherry juice but she recently switched to capsules as she realized that the regular juice is raising her blood sugars. -For now, I did not suggest to change her regimen -I advised her to: Patient Instructions  Please continue: - Glipizide ER 5 mg before breakfast (10 mg daily for few days)  Please return in 4 months with your sugar log.  - we checked her HbA1c: 7.7% (higher than before definitely higher than expected from her log. - advised to check sugars at different times of the day - 1x a day, rotating check times - advised for yearly eye exams >> she is UTD - return to clinic in 4 months   2. Goiter -She denies neck compression symptoms -The latest thyroid ultrasound report reviewed, she only has small thyroid cysts -Latest TSH was normal in 01/2020: Lab Results  Component Value Date   TSH 2.17 01/10/2020  -No follow-up necessary for now  3. HL -Reviewed latest lipid panel from  10/2020: All fractions at goal: Lab Results  Component Value Date   CHOL 130 10/23/2020   HDL 49 10/23/2020   LDLCALC 70 10/23/2020   TRIG 47 10/23/2020   CHOLHDL 2.7 10/23/2020  -On Crestor 5 mg daily, tolerated well  Office Visit on 05/12/2021  Component Date Value Ref Range Status   Hemoglobin A1C 05/12/2021 7.7 (A)  4.0 - 5.6 % Final   Fructosamine 05/12/2021 338 (A)  205 - 285 umol/L  Final   HbA1c calculated from fructosamine is: 7.35%, lower than the directly measured HbA1c and also lower than the one from last visit.  Philemon Kingdom, MD PhD [facility] Endocrinology

## 2021-05-12 NOTE — Patient Instructions (Addendum)
Please continue: - Glipizide ER 5 mg before breakfast (10 mg daily for few days)  Please return in 4 months with your sugar log.

## 2021-05-15 LAB — FRUCTOSAMINE: Fructosamine: 338 umol/L — ABNORMAL HIGH (ref 205–285)

## 2021-05-27 ENCOUNTER — Telehealth: Payer: Medicare Other

## 2021-05-27 NOTE — Progress Notes (Incomplete)
Chronic Care Management Pharmacy Note  05/27/2021 Name:  [name] MRN:  [id number] DOB:  Feb 08, 1951  Summary: - Patient reports that she is doing well, has no issues or complaints about any of her medications -Monitoring blood pressure and HR at home at least once daily reports that it has been averaging 120/60-70's with a HR 90-98 bpm, consistent daily weights 182-183lbs  -Monitoring blood sugars at least once daily, in the morning is averaging 120's, in the evening is averaging 130-140, no issues with hypoglycemia  Recommendations/Changes made from today's visit: -Recommending no changes to medications, patient to continue to monitor blood pressure and blood sugars, patient to reach out should BP/ BG elevate from goal, or if she has any issues with hypotension / hypoglycemia    Subjective: [name] is an 70 y.o. year old female who is a primary patient of [name], Evie Lacks, MD.  The CCM team was consulted for assistance with disease management and care coordination needs.    Engaged with patient by telephone for initial visit in response to provider referral for pharmacy case management and/or care coordination services.   Consent to Services:  The patient was given the following information about Chronic Care Management services today, agreed to services, and gave verbal consent: 1. CCM service includes personalized support from designated clinical staff supervised by the primary care provider, including individualized plan of care and coordination with other care providers 2. 24/7 contact phone numbers for assistance for urgent and routine care needs. 3. Service will only be billed when office clinical staff spend 20 minutes or more in a month to coordinate care. 4. Only one practitioner may furnish and bill the service in a calendar month. 5.The patient may stop CCM services at any time (effective at the end of the month) by phone call to the office staff. 6.  The patient will be responsible for cost sharing (co-pay) of up to 20% of the service fee (after annual deductible is met). Patient agreed to services and consent obtained.  Patient Care Team: [name], Evie Lacks, MD as PCP - General Nahser, Wonda Cheng, MD as PCP - Cardiology (Cardiology) Irene Shipper, MD as Consulting Physician (Gastroenterology) Madelon Lips, MD as Consulting Physician (Nephrology) Philemon Kingdom, MD as Consulting Physician (Internal Medicine) Tomasa Blase, [facility] as Pharmacist (Pharmacist)  Recent office visits:  04/10/2021 - Dr. Alain Marion - patient to restart crestor - counseled on noncompliance    Recent consult visits:  05/12/2021 - Dr. Cruzita Lederer - Endo - no changes to medications at this time f/u in 3 months     Hospital visits:  None in previous 6 months    Objective:  Lab Results  Component Value Date   CREATININE 2.74 (H) 10/23/2020   BUN 70 (H) 10/23/2020   [name] 19.03 (L) 05/16/2019   GFRNONAA 34 (L) 03/13/2020   GFRAA 39 (L) 03/13/2020   NA 139 10/23/2020   K 4.8 10/23/2020   CALCIUM 9.5 10/23/2020   CO2 21 10/23/2020   GLUCOSE 209 (H) 10/23/2020    Lab Results  Component Value Date/Time   HGBA1C 7.7 (A) 05/12/2021 01:13 PM   HGBA1C 7.6 (A) 01/02/2021 01:54 PM   HGBA1C 8.4 (H) 05/16/2019 11:20 AM   HGBA1C 9.3 (H) 10/26/2017 01:49 PM   FRUCTOSAMINE 338 (H) 05/12/2021 01:50 PM   FRUCTOSAMINE 342 (H) 05/16/2020 11:31 AM   [name] 19.03 (L) 05/16/2019 11:20 AM   [name] 18.29 (L) 01/10/2019 10:18 AM   MICROALBUR 13.9 (H)  10/13/2007 09:47 AM    Last diabetic Eye exam:  Lab Results  Component Value Date/Time   HMDIABEYEEXA Retinopathy (A) 02/08/2016 12:00 AM    Last diabetic Foot exam:  No results found for: HMDIABFOOTEX   Lab Results  Component Value Date   CHOL 130 10/23/2020   HDL 49 10/23/2020   LDLCALC 70 10/23/2020   TRIG 47 10/23/2020   CHOLHDL 2.7 10/23/2020    Hepatic Function Latest Ref Rng & Units 10/23/2020 01/10/2019  03/29/2018  Total Protein 6.0 - 8.3 g/dL - 7.8 6.6  Albumin 3.5 - 5.2 g/dL - 4.4 4.2  AST 0 - 37 U/L - 12 12  ALT 0 - 32 IU/L _0 Alk Phosphatase 39 - 117 U/L - 112 134(H)  Total Bilirubin 0.2 - 1.2 mg/dL - 0.4 0.2  Bilirubin, Direct 0.0 - 0.3 mg/dL - 0.0 0.09    Lab Results  Component Value Date/Time   TSH 2.17 01/10/2020 10:57 AM   TSH 1.88 01/10/2019 10:18 AM    CBC Latest Ref Rng & Units 03/13/2020 03/12/2020 03/11/2020  WBC 4.0 - 10.5 K/uL 13.3(H) - 13.9(H)  Hemoglobin 12.0 - 15.0 g/dL 8.7(L) 8.9(L) 6.6(LL)  Hematocrit 36.0 - 46.0 % 27.8(L) 27.9(L) 21.9(L)  Platelets 150 - 400 K/uL 242 - 184    Lab Results  Component Value Date/Time   VD25OH 33 05/06/2010 06:50 PM    Clinical ASCVD: Yes  The ASCVD Risk score (Arnett DK, et al., 2019) failed to calculate for the following reasons:   The patient has a prior MI or stroke diagnosis    Depression screen [facility] 2/9 10/09/2020 10/09/2020 05/16/2019  Decreased Interest 0 0 0  Down, Depressed, Hopeless 0 0 0  PHQ - 2 Score 0 0 0  Altered sleeping 0 - -  Tired, decreased energy 0 - -  Change in appetite 0 - -  Feeling bad or failure about yourself  0 - -  Trouble concentrating 0 - -  Moving slowly or fidgety/restless 0 - -  Suicidal thoughts 0 - -  PHQ-9 Score 0 - -     Social History   Tobacco Use  Smoking Status Light Smoker   Types: Cigarettes  Smokeless Tobacco Never   BP Readings from Last 3 Encounters:  05/12/21 120/60  04/10/21 136/70  01/02/21 122/70   Pulse Readings from Last 3 Encounters:  05/12/21 78  04/10/21 75  01/02/21 75   Wt Readings from Last 3 Encounters:  05/12/21 188 lb 6.4 oz (85.5 kg)  04/10/21 189 lb 3.2 oz (85.8 kg)  01/02/21 188 lb (85.3 kg)   BMI Readings from Last 3 Encounters:  05/12/21 34.46 kg/m  04/10/21 34.61 kg/m  01/02/21 34.39 kg/m    Assessment/Interventions: Review of patient past medical history, allergies, medications, health status, including review of consultants  reports, laboratory and other test data, was performed as part of comprehensive evaluation and provision of chronic care management services.   SDOH:  (Social Determinants of Health) assessments and interventions performed: Yes  SDOH Screenings   Alcohol Screen: Not on file  Depression (PHQ2-9): Low Risk    PHQ-2 Score: 0  Financial Resource Strain: Low Risk    Difficulty of Paying Living Expenses: Not hard at all  Food Insecurity: Not on file  Housing: Not on file  Physical Activity: Not on file  Social Connections: Not on file  Stress: Not on file  Tobacco Use: High Risk   Smoking Tobacco Use: Light Smoker  Smokeless Tobacco Use: Never   Passive Exposure: Not on file  Transportation Needs: Not on file    CCM Care Plan  Allergies  Allergen Reactions   Tradjenta [Linagliptin] Anaphylaxis    CP   Atorvastatin     REACTION: aches and pains   Enalapril Maleate     REACTION: cough   Farxiga [Dapagliflozin] Itching   Hydrochlorothiazide     REACTION: hair loss   Kenalog [Triamcinolone Acetonide]     HANDS NUMB    Levaquin [Levofloxacin] Hives   Metformin And Related     Diarrhea, dizziness   Propoxyphene N-Acetaminophen Hives   Simvastatin     REACTION: cramps   Spironolactone     REACTION: cramps    Medications Reviewed Today     Reviewed by Philemon Kingdom, MD (Physician) on 05/12/21 at 1307  Med List Status: <None>   Medication Order Taking? Sig Documenting Provider Last Dose Status Informant  acetaminophen (TYLENOL) 325 MG tablet [id number]  Take 325 mg by mouth every 6 (six) hours as needed. [provider]  Active   amLODipine (NORVASC) 10 MG tablet [id number]  TAKE ONE-HALF TABLET BY  MOUTH DAILY  Patient taking differently: Take 5 mg by mouth daily.   [name], Evie Lacks, MD  Active   Ascorbic Acid (VITAMIN C) 1000 MG tablet [id number]  Take 1,000 mg by mouth daily. [provider]  Active   aspirin 81 MG chewable tablet [id number]  Chew  1 tablet (81 mg total) by mouth daily. Erick Colace, NP  Active Self  BYSTOLIC 10 MG tablet [id number]  TAKE 1 TABLET BY MOUTH EVERY DAY [name], Evie Lacks, MD  Active   calcium carbonate (OS-CAL) 600 MG TABS tablet [id number]  Take 600 mg by mouth 2 (two) times daily with a meal. [provider]  Active Self  Cholecalciferol (VITAMIN D3) 50 MCG (2000 UT) capsule [id number]  Take 1 capsule (2,000 Units total) by mouth daily. [name], Evie Lacks, MD  Active Self  ferrous sulfate 325 (65 FE) MG tablet [id number]  Take 325 mg by mouth 2 (two) times daily with a meal. [provider]  Active   glipiZIDE (GLUCOTROL XL) 5 MG 24 hr tablet [id number]  TAKE 1 TABLET BY MOUTH  DAILY WITH Nyoka Cowden, MD  Active   glucose blood (ONETOUCH ULTRA) test strip [id number]  USE TO TEST BLOOD SUGAR  TWICE DAILY AS DIRECTED [name], Evie Lacks, MD  Active   hydrALAZINE (APRESOLINE) 25 MG tablet [id number]  Take 1 tablet (25 mg total) by mouth 3 (three) times daily. Nahser, Wonda Cheng, MD  Active   HYDROcodone-acetaminophen [facility]) 5-325 MG tablet [id number]  Take 1 tablet by mouth every 6 (six) hours as needed for severe pain. [name], Evie Lacks, MD  Active   lansoprazole (PREVACID) 15 MG capsule [id number]  Take 1 capsule (15 mg total) by mouth daily at 12 noon. [name], Evie Lacks, MD  Active   ondansetron (ZOFRAN) 4 MG tablet [id number]  Take 1 tablet (4 mg total) by mouth every 8 (eight) hours as needed for nausea or vomiting. [name], Evie Lacks, MD  Active   potassium chloride SA (KLOR-CON) 20 MEQ tablet [id number]  TAKE 1 TABLET BY MOUTH  DAILY [name], Evie Lacks, MD  Active   rosuvastatin (CRESTOR) 5 MG tablet [id number]  Take 1 tablet (5 mg total) by mouth daily. TAKE 1 TABLET BY MOUTH  DAILY AT 6PM [name], Evie Lacks, MD  Active   torsemide (DEMADEX) 100  MG tablet [id number]  TAKE 0.5-1 TABLETS (50-100 MG TOTAL) BY MOUTH DAILY. [name], Evie Lacks, MD  Active   vitamin B-12  (CYANOCOBALAMIN) 1000 MCG tablet [id number]  Take 1,000 mcg by mouth daily. [provider]  Active Self            Patient Active Problem List   Diagnosis Date Noted   Atherosclerosis of aorta ([place]) 10/21/2020   Right groin wound, subsequent encounter 05/02/2020   Abdominal wall abscess 05/02/2020   Wounds, multiple 04/10/2020   Hyperglycemia 03/06/2020   Acute lower UTI 03/06/2020   Cellulitis 03/06/2020   DKA, type 2 ([place]) 03/06/2020   Sepsis ([place]) 03/06/2020   Vaginal cysts 08/03/2018   Hypertensive heart disease with congestive heart failure ([place]) 05/11/2018   Boils 04/27/2018   Edema 03/17/2017   Rash and nonspecific skin eruption 12/08/2016   CKD stage 4 due to type 2 diabetes mellitus ([place]) 09/24/2016   Cough 04/13/2016   Left shoulder pain 09/16/2015   Allergic rhinitis 09/16/2015   Goiter 10/19/2014   Low back pain 11/13/2013   Dizziness 11/13/2013   Pruritus 08/01/2013   Well adult exam 08/01/2013   GERD (gastroesophageal reflux disease) 08/01/2013   CAD (coronary artery disease) 06/11/2013   Chronic combined systolic and diastolic CHF (congestive heart failure) ([place]) 06/11/2013   Hypertension associated with diabetes ([place]) 06/07/2013   Personal history of colon cancer    Hyperlipidemia    Obesity    Disorder resulting from impaired renal function 12/19/2008   LEG PAIN, BILATERAL 09/11/2008   Acute sinusitis 06/13/2008   COPD mixed type ([place]) 02/09/2008   Poorly controlled type 2 diabetes mellitus with circulatory disorder ([place]) 10/13/2007   Iron deficiency anemia 10/13/2007   TOBACCO USE DISORDER/SMOKER-SMOKING CESSATION DISCUSSED 10/13/2007   WEIGHT LOSS, ABNORMAL 10/13/2007   History of malignant neoplasm of large intestine 10/13/2007    There is no immunization history for the selected administration types on file for this patient.  Conditions to be addressed/monitored:  Hypertension, Hyperlipidemia, Diabetes, Heart Failure, GERD, Chronic  Kidney Disease, and Gout  There are no care plans that you recently modified to display for this patient.     Medication Assistance: None required.  Patient affirms current coverage meets needs.  Patient's preferred pharmacy is:  [id number] [place], [place] [address]Phone: [phone]: [phone] [facility] ([address] [address] [place]Phone: [phone]: [phone] [facility] [place] [address]Phone: [phone]: [phone]  Uses pill box? Yes Pt endorses 100% compliance  Care Plan and Follow Up Patient Decision:  Patient agrees to Care Plan and Follow-up.  Plan: Telephone follow up appointment with care management team member scheduled for:  ***3 months and The patient has been provided with contact information for the care management team and has been advised to call with any health related questions or concerns.   DTomasa Blase PharmD Clinical Pharmacist, [place] ***

## 2021-05-28 ENCOUNTER — Telehealth: Payer: Medicare Other

## 2021-05-30 ENCOUNTER — Telehealth: Payer: Medicare Other

## 2021-07-17 ENCOUNTER — Encounter: Payer: Self-pay | Admitting: Internal Medicine

## 2021-07-17 ENCOUNTER — Telehealth: Payer: Self-pay | Admitting: *Deleted

## 2021-07-17 ENCOUNTER — Other Ambulatory Visit: Payer: Self-pay

## 2021-07-17 ENCOUNTER — Ambulatory Visit (INDEPENDENT_AMBULATORY_CARE_PROVIDER_SITE_OTHER): Payer: Medicare Other | Admitting: Internal Medicine

## 2021-07-17 VITALS — BP 130/70 | HR 79 | Temp 98.4°F | Ht 62.0 in | Wt 189.4 lb

## 2021-07-17 DIAGNOSIS — G8929 Other chronic pain: Secondary | ICD-10-CM

## 2021-07-17 DIAGNOSIS — N184 Chronic kidney disease, stage 4 (severe): Secondary | ICD-10-CM | POA: Diagnosis not present

## 2021-07-17 DIAGNOSIS — E1122 Type 2 diabetes mellitus with diabetic chronic kidney disease: Secondary | ICD-10-CM

## 2021-07-17 DIAGNOSIS — E1159 Type 2 diabetes mellitus with other circulatory complications: Secondary | ICD-10-CM

## 2021-07-17 DIAGNOSIS — E1165 Type 2 diabetes mellitus with hyperglycemia: Secondary | ICD-10-CM | POA: Diagnosis not present

## 2021-07-17 DIAGNOSIS — M5440 Lumbago with sciatica, unspecified side: Secondary | ICD-10-CM | POA: Diagnosis not present

## 2021-07-17 DIAGNOSIS — E785 Hyperlipidemia, unspecified: Secondary | ICD-10-CM

## 2021-07-17 DIAGNOSIS — I152 Hypertension secondary to endocrine disorders: Secondary | ICD-10-CM

## 2021-07-17 DIAGNOSIS — K2100 Gastro-esophageal reflux disease with esophagitis, without bleeding: Secondary | ICD-10-CM

## 2021-07-17 DIAGNOSIS — I5042 Chronic combined systolic (congestive) and diastolic (congestive) heart failure: Secondary | ICD-10-CM | POA: Diagnosis not present

## 2021-07-17 DIAGNOSIS — D509 Iron deficiency anemia, unspecified: Secondary | ICD-10-CM

## 2021-07-17 LAB — CBC WITH DIFFERENTIAL/PLATELET
Basophils Absolute: 0 10*3/uL (ref 0.0–0.1)
Basophils Relative: 0.4 % (ref 0.0–3.0)
Eosinophils Absolute: 0.1 10*3/uL (ref 0.0–0.7)
Eosinophils Relative: 1.8 % (ref 0.0–5.0)
HCT: 29.8 % — ABNORMAL LOW (ref 36.0–46.0)
Hemoglobin: 9.5 g/dL — ABNORMAL LOW (ref 12.0–15.0)
Lymphocytes Relative: 19.3 % (ref 12.0–46.0)
Lymphs Abs: 0.9 10*3/uL (ref 0.7–4.0)
MCHC: 32 g/dL (ref 30.0–36.0)
MCV: 86.7 fl (ref 78.0–100.0)
Monocytes Absolute: 0.4 10*3/uL (ref 0.1–1.0)
Monocytes Relative: 9.1 % (ref 3.0–12.0)
Neutro Abs: 3.2 10*3/uL (ref 1.4–7.7)
Neutrophils Relative %: 69.4 % (ref 43.0–77.0)
Platelets: 143 10*3/uL — ABNORMAL LOW (ref 150.0–400.0)
RBC: 3.44 Mil/uL — ABNORMAL LOW (ref 3.87–5.11)
RDW: 16.6 % — ABNORMAL HIGH (ref 11.5–15.5)
WBC: 4.7 10*3/uL (ref 4.0–10.5)

## 2021-07-17 LAB — COMPREHENSIVE METABOLIC PANEL
ALT: 13 U/L (ref 0–35)
AST: 14 U/L (ref 0–37)
Albumin: 4.3 g/dL (ref 3.5–5.2)
Alkaline Phosphatase: 134 U/L — ABNORMAL HIGH (ref 39–117)
BUN: 75 mg/dL — ABNORMAL HIGH (ref 6–23)
CO2: 24 mEq/L (ref 19–32)
Calcium: 9.3 mg/dL (ref 8.4–10.5)
Chloride: 104 mEq/L (ref 96–112)
Creatinine, Ser: 3.14 mg/dL — ABNORMAL HIGH (ref 0.40–1.20)
GFR: 14.43 mL/min — CL (ref >60.00)
Glucose, Bld: 170 mg/dL — ABNORMAL HIGH (ref 70–99)
Potassium: 4.3 mEq/L (ref 3.5–5.1)
Sodium: 137 mEq/L (ref 135–145)
Total Bilirubin: 0.2 mg/dL (ref 0.2–1.2)
Total Protein: 7.7 g/dL (ref 6.0–8.3)

## 2021-07-17 LAB — URINALYSIS
Bilirubin Urine: NEGATIVE
Hgb urine dipstick: NEGATIVE
Ketones, ur: NEGATIVE
Leukocytes,Ua: NEGATIVE
Nitrite: NEGATIVE
Specific Gravity, Urine: 1.01 (ref 1.000–1.030)
Total Protein, Urine: NEGATIVE
Urine Glucose: NEGATIVE
Urobilinogen, UA: 0.2 (ref 0.0–1.0)
pH: 5.5 (ref 5.0–8.0)

## 2021-07-17 LAB — TSH: TSH: 1.51 u[IU]/mL (ref 0.35–5.50)

## 2021-07-17 NOTE — Telephone Encounter (Signed)
Noted.  It is chronic.  Thank you

## 2021-07-17 NOTE — Progress Notes (Signed)
Subjective:  Patient ID: [name], female    DOB: Mar 18, 1951  Age: 71 y.o. MRN: [id number]  CC: Follow-up (3 month f/u)   HPI [name] presents for LBP flare up - better F/u HTN, CRI, DM. C/o stiffness when sitting    Outpatient Medications Prior to Visit  Medication Sig Dispense Refill   acetaminophen (TYLENOL) 325 MG tablet Take 325 mg by mouth every 6 (six) hours as needed.     allopurinol (ZYLOPRIM) 100 MG tablet Take 50 mg by mouth daily.     amLODipine (NORVASC) 10 MG tablet TAKE ONE-HALF TABLET BY  MOUTH DAILY (Patient taking differently: Take 5 mg by mouth daily.) 45 tablet 3   Ascorbic Acid (VITAMIN C) 1000 MG tablet Take 1,000 mg by mouth daily.     aspirin 81 MG chewable tablet Chew 1 tablet (81 mg total) by mouth daily.     BYSTOLIC 10 MG tablet TAKE 1 TABLET BY MOUTH EVERY DAY 90 tablet 3   calcium carbonate (OS-CAL) 600 MG TABS tablet Take 600 mg by mouth 2 (two) times daily with a meal.     Cholecalciferol (VITAMIN D3) 50 MCG (2000 UT) capsule Take 1 capsule (2,000 Units total) by mouth daily. 100 capsule 3   ferrous sulfate 325 (65 FE) MG tablet Take 325 mg by mouth 2 (two) times daily with a meal.     glipiZIDE (GLUCOTROL XL) 5 MG 24 hr tablet TAKE 1 TABLET BY MOUTH  DAILY WITH BREAKFAST 90 tablet 3   glucose blood (ONETOUCH ULTRA) test strip USE TO TEST BLOOD SUGAR  TWICE DAILY AS DIRECTED 200 strip 3   hydrALAZINE (APRESOLINE) 25 MG tablet Take 1 tablet (25 mg total) by mouth 3 (three) times daily. 270 tablet 3   HYDROcodone-acetaminophen (NORCO) 5-325 MG tablet Take 1 tablet by mouth every 6 (six) hours as needed for severe pain. 20 tablet 0   lansoprazole (PREVACID) 15 MG capsule Take 1 capsule (15 mg total) by mouth daily at 12 noon. 90 capsule 3   ondansetron (ZOFRAN) 4 MG tablet Take 1 tablet (4 mg total) by mouth every 8 (eight) hours as needed for nausea or vomiting. 20 tablet 0   potassium chloride SA (KLOR-CON) 20 MEQ tablet TAKE 1  TABLET BY MOUTH  DAILY 90 tablet 3   rosuvastatin (CRESTOR) 5 MG tablet Take 1 tablet (5 mg total) by mouth daily. TAKE 1 TABLET BY MOUTH  DAILY AT 6PM 90 tablet 3   torsemide (DEMADEX) 100 MG tablet TAKE 0.5-1 TABLETS (50-100 MG TOTAL) BY MOUTH DAILY. 90 tablet 1   vitamin B-12 (CYANOCOBALAMIN) 1000 MCG tablet Take 1,000 mcg by mouth daily.     No facility-administered medications prior to visit.    ROS: Review of Systems  Constitutional:  Negative for activity change, appetite change, chills, fatigue and unexpected weight change.  HENT:  Negative for congestion, mouth sores and sinus pressure.   Eyes:  Negative for visual disturbance.  Respiratory:  Negative for cough and chest tightness.   Gastrointestinal:  Negative for abdominal pain and nausea.  Genitourinary:  Negative for difficulty urinating, frequency and vaginal pain.  Musculoskeletal:  Positive for arthralgias, back pain and gait problem.  Skin:  Negative for pallor and rash.  Neurological:  Negative for dizziness, tremors, weakness, numbness and headaches.  Psychiatric/Behavioral:  Negative for confusion, sleep disturbance and suicidal ideas.    Objective:  BP 130/70 (BP Location: Left Arm)    Pulse 79  Temp 98.4 F (36.9 C) (Oral)    Ht 5\' 2"  (1.575 m)    Wt 189 lb 6.4 oz (85.9 kg)    LMP  (LMP Unknown)    SpO2 98%    BMI 34.64 kg/m   BP Readings from Last 3 Encounters:  07/17/21 130/70  05/12/21 120/60  04/10/21 136/70    Wt Readings from Last 3 Encounters:  07/17/21 189 lb 6.4 oz (85.9 kg)  05/12/21 188 lb 6.4 oz (85.5 kg)  04/10/21 189 lb 3.2 oz (85.8 kg)    Physical Exam Constitutional:      General: She is not in acute distress.    Appearance: She is well-developed. She is obese.  HENT:     Head: Normocephalic.     Right Ear: External ear normal.     Left Ear: External ear normal.     Nose: Nose normal.  Eyes:     General:        Right eye: No discharge.        Left eye: No discharge.      Conjunctiva/sclera: Conjunctivae normal.     Pupils: Pupils are equal, round, and reactive to light.  Neck:     Thyroid: No thyromegaly.     Vascular: No JVD.     Trachea: No tracheal deviation.  Cardiovascular:     Rate and Rhythm: Normal rate and regular rhythm.     Heart sounds: Normal heart sounds.  Pulmonary:     Effort: No respiratory distress.     Breath sounds: No stridor. No wheezing.  Abdominal:     General: Bowel sounds are normal. There is no distension.     Palpations: Abdomen is soft. There is no mass.     Tenderness: There is no abdominal tenderness. There is no guarding or rebound.  Musculoskeletal:        General: No tenderness.     Cervical back: Normal range of motion and neck supple. No rigidity.  Lymphadenopathy:     Cervical: No cervical adenopathy.  Skin:    Findings: No erythema or rash.  Neurological:     Cranial Nerves: No cranial nerve deficit.     Motor: No abnormal muscle tone.     Coordination: Coordination normal.     Gait: Gait abnormal.     Deep Tendon Reflexes: Reflexes normal.  Psychiatric:        Behavior: Behavior normal.        Thought Content: Thought content normal.        Judgment: Judgment normal.    Lab Results  Component Value Date   WBC 13.3 (H) 03/13/2020   HGB 8.7 (L) 03/13/2020   HCT 27.8 (L) 03/13/2020   PLT 242 03/13/2020   GLUCOSE 209 (H) 10/23/2020   CHOL 130 10/23/2020   TRIG 47 10/23/2020   HDL 49 10/23/2020   LDLCALC 70 10/23/2020   ALT 9 10/23/2020   AST 12 01/10/2019   NA 139 10/23/2020   K 4.8 10/23/2020   CL 102 10/23/2020   CREATININE 2.74 (H) 10/23/2020   BUN 70 (H) 10/23/2020   CO2 21 10/23/2020   TSH 2.17 01/10/2020   INR 1.1 06/17/2020   HGBA1C 7.7 (A) 05/12/2021   MICROALBUR 13.9 (H) 10/13/2007    CT ABDOMEN PELVIS WO CONTRAST  Result Date: 08/06/2020 CLINICAL DATA:  71 year old female with history of abdominal wall abscess status post percutaneous drain placement on 06/17/2020. Presents for  follow-up. EXAM: CT ABDOMEN AND PELVIS WITHOUT  CONTRAST TECHNIQUE: Multidetector CT imaging of the abdomen and pelvis was performed following the standard protocol without IV contrast. COMPARISON:  06/03/2020, 06/17/2020 FINDINGS: Lower chest: No acute abnormality. Hepatobiliary: No focal liver abnormality is seen. No gallstones, gallbladder wall thickening, or biliary dilatation. Pancreas: Unremarkable. No pancreatic ductal dilatation or surrounding inflammatory changes. Spleen: Normal in size without focal abnormality. Adrenals/Urinary Tract: Adrenal glands are unremarkable. Kidneys are normal, without renal calculi, focal lesion, or hydronephrosis. Bladder is unremarkable. Stomach/Bowel: Stomach is within normal limits. Postsurgical changes after right hemicolectomy without complicating features. Scattered colonic diverticula without surrounding inflammatory changes. No evidence of bowel wall thickening, distention, or inflammatory changes. Vascular/Lymphatic: Similar appearing scattered abdominal aortic atherosclerotic calcifications. No enlarged abdominal or pelvic lymph nodes. Reproductive: Uterus and bilateral adnexa are unremarkable. Other: Interval decreased size of previously visualized right lower anterior abdominal wall fluid collection with unchanged position of indwelling drain. The collection is now immeasurable. There is persistent surrounding fat stranding and anterior lower abdominal wall skin thickening, suggestive of persistent inflammatory process. Musculoskeletal: No acute osseous abnormality. Multilevel degenerative changes of the thoracolumbar spine, most pronounced at L2-L3 and L5-S1. IMPRESSION: 1. Interval resolution of previously visualized right anterior lower abdominal extraperitoneal fluid collection with unchanged position of indwelling pigtail drain. 2. No acute intra-abdominal abnormality. 3.  Aortic Atherosclerosis (ICD10-I70.0). PLAN: IR to remove indwelling percutaneous drain.   Follow-up as needed. Ruthann Cancer, MD [facility] [address] [facility] Electronically Signed   By: Ruthann Cancer MD   On: 08/06/2020 12:45   IR Radiologist Eval & Mgmt  Result Date: 08/06/2020 Please refer to notes tab for details about interventional procedure. (Op Note)   Assessment & Plan:   Problem List Items Addressed This Visit     Chronic combined systolic and diastolic CHF (congestive heart failure) (HCC) (Chronic)    Continue with amlodipine, torsemide, Bystolic, Norvasc, Hydralazine, Torsemide      Relevant Orders   CBC with Differential/Platelet   Comprehensive metabolic panel   Iron, TIBC and Ferritin Panel   TSH   Urinalysis   CKD stage 4 due to type 2 diabetes mellitus (HCC) (Chronic)   Relevant Orders   CBC with Differential/Platelet   Comprehensive metabolic panel   Iron, TIBC and Ferritin Panel   TSH   Urinalysis   Hypertension associated with diabetes (HCC) (Chronic)    Continue with amlodipine, torsemide, Bystolic, Norvasc, Hydralazine, Torsemide      GERD (gastroesophageal reflux disease)    On Prevacid      Hyperlipidemia    Continue Crestor and Krill oil      Iron deficiency anemia - Primary   Relevant Orders   CBC with Differential/Platelet   Iron, TIBC and Ferritin Panel   Low back pain    Better. Using Tylenol prn       Poorly controlled type 2 diabetes mellitus with circulatory disorder (HCC)    Off Metformin On Glipizide. Continue Crestor and Krill oil      Relevant Orders   CBC with Differential/Platelet   Comprehensive metabolic panel   Iron, TIBC and Ferritin Panel   TSH   Urinalysis      No orders of the defined types were placed in this encounter.     Follow-up: Return in about 6 months (around 01/14/2022) for a follow-up visit.  Walker Kehr, MD

## 2021-07-17 NOTE — Assessment & Plan Note (Signed)
Continue Crestor and Krill oil

## 2021-07-17 NOTE — Assessment & Plan Note (Signed)
Better. Using Tylenol prn

## 2021-07-17 NOTE — Assessment & Plan Note (Signed)
Off Metformin On Glipizide. Continue Crestor and Krill oil

## 2021-07-17 NOTE — Assessment & Plan Note (Signed)
Continue with amlodipine, torsemide, Bystolic, Norvasc, Hydralazine, Torsemide  

## 2021-07-17 NOTE — Telephone Encounter (Signed)
Critical- [name] 14.43

## 2021-07-17 NOTE — Assessment & Plan Note (Signed)
On Prevacid 

## 2021-07-18 LAB — IRON,TIBC AND FERRITIN PANEL
%SAT: 11 % (calc) — ABNORMAL LOW (ref 16–45)
Ferritin: 17 ng/mL (ref 16–288)
Iron: 37 ug/dL — ABNORMAL LOW (ref 45–160)
TIBC: 326 mcg/dL (calc) (ref 250–450)

## 2021-07-29 DIAGNOSIS — E113511 Type 2 diabetes mellitus with proliferative diabetic retinopathy with macular edema, right eye: Secondary | ICD-10-CM | POA: Diagnosis not present

## 2021-08-03 ENCOUNTER — Other Ambulatory Visit: Payer: Self-pay | Admitting: Internal Medicine

## 2021-09-11 ENCOUNTER — Encounter: Payer: Self-pay | Admitting: Internal Medicine

## 2021-09-11 ENCOUNTER — Ambulatory Visit (INDEPENDENT_AMBULATORY_CARE_PROVIDER_SITE_OTHER): Payer: Medicare Other | Admitting: Internal Medicine

## 2021-09-11 ENCOUNTER — Other Ambulatory Visit: Payer: Self-pay

## 2021-09-11 VITALS — BP 130/62 | HR 83 | Ht 62.0 in | Wt 187.0 lb

## 2021-09-11 DIAGNOSIS — E1159 Type 2 diabetes mellitus with other circulatory complications: Secondary | ICD-10-CM

## 2021-09-11 DIAGNOSIS — E1165 Type 2 diabetes mellitus with hyperglycemia: Secondary | ICD-10-CM

## 2021-09-11 DIAGNOSIS — E785 Hyperlipidemia, unspecified: Secondary | ICD-10-CM

## 2021-09-11 DIAGNOSIS — E049 Nontoxic goiter, unspecified: Secondary | ICD-10-CM | POA: Diagnosis not present

## 2021-09-11 LAB — POCT GLYCOSYLATED HEMOGLOBIN (HGB A1C): Hemoglobin A1C: 7.3 % — AB (ref 4.0–5.6)

## 2021-09-11 NOTE — Progress Notes (Signed)
Patient ID: [name], female   DOB: [birth year], 71 y.o.   MRN: [id number]  This visit occurred during the SARS-CoV-2 public health emergency.  Safety protocols were in place, including screening questions prior to the visit, additional usage of staff PPE, and extensive cleaning of exam room while observing appropriate contact time as indicated for disinfecting solutions.   HPI: [name] is a 71 y.o.-year-old female, returning for f/u DM2, dx in 2006, insulin-independent, uncontrolled, with complications (CAD - s/p AMI 06/2013, CHF; CKD; PN; DR). Last visit 4 months ago.  Interim history: No increased urination, blurry vision, nausea, chest pain. Before last visit sugars were higher, up to 400s after getting steroids for left foot gout.  No steroids since last visit. She is on tart cherry diet juice. She was on iron therapy - not taking it consistently. She has mm cramps at night.  Reviewed HbA1c levels: 05/12/2021: HbA1c calculated from fructosamine is: 7.35%, lower than the directly measured HbA1c. Lab Results  Component Value Date   HGBA1C 7.7 (A) 05/12/2021   HGBA1C 7.6 (A) 01/02/2021   HGBA1C 7.0 (A) 08/29/2020  05/16/2020: HbA1c calculated from fructosamine is: 7.4%, lower than the directly measured HbA1c. 05/02/2019: HbA1c calculated from fructosamine was 7.0% 12/30/2018: HbA1c calculated from fructosamine is higher, at 8.2%, possibly 2/2 drinking juice 08/24/2018: HbA1c calculated from fructosamine is higher, at 8%, possibly 2/2 ABx  04/20/2018: HbA1c calculated from fructosamine is slightly better than before, at 7% 12/15/2017: HbA1c calculated from fructosamine: 7.17%, slightly improved from last visit  08/17/2017: HbA1c calculated from fructosamine: 7.2%, slightly improved 04/15/2017: HbA1c calculated from the fructosamine is lower than measured, at 7.3% 08/21/2016: HbA1c 6.0%. She started to change her diet after her hemoglobin A1c returned high, at 16.2%  in 09/2014. She cut down portions, changed the meal content to include more fiber and less carbs.  HbA1c decreased dramatically.  Pt is on: - Glipizide ER 5 mg in a.m.-started 2016 >> 10 mg >> 5 mg before breakfast (10 mg during steroid treatment) She stopped metformin ER in 01/2017. She tried Tradjenta 5 mg daily in am >> CP with it (started 10/2014) >> had to stop She tried [place] >> decreased [name] and yeast inf. She tried regular metformin >> diarrhea She refused insulin in the past.  Pt checks her sugars twice a day per review of her log: - am:  77, 98-136, 148, 155 >> 87-149, 157, 170, 180 >> 90-124, 140 - 2h after b'fast: 122-139 >> 129-144 >> 130-140 >> 114 >> n/c - before lunch: 125 >> 89, 121-151 >> 103-141, 158 >> n/c - 2h after lunch: 169 >> 120-130 >> n/c  - before dinner:  110-144 >> 118-130 >> n/c - 2h after dinner: 119-147, 155 >> 115-166 >> 119-167 >> 119-138, 149 - bedtime: 137 >> n/c >> 147-157 >> n/c - nighttime: n/c Lowest sugar was  68 >> 66  >> 77 >> 87 >> 90; it is unclear at which level she has hypoglycemia awareness. Highest sugar was 169 >> 156 >> 176 >> 400 (Prednisone) >> 180 >> 149.  Glucometer: One Touch Ultra  Pt's meals are: - Breakfast: oatmeal, cereals, [place] bacon, egg toast; cinnamon on oatmeal or cheerios - Lunch: sandwich or fruit salad - Dinner: salmon/chicken, Brussel sprouts, other veggies, sweet potatoes or brown rice, spaghetti - Snacks: 2: carrots with dip; yoghurt with fruit, veggies + dip; fruit She walks for exercise. She saw Dr. Hollie Salk with nephrology.  She was started on a high  potassium, low protein diet  -+ CKD-sees nephrology; latest BUN/creatinine: Lab Results  Component Value Date   BUN 75 (H) 07/17/2021   CREATININE 3.14 (H) 07/17/2021  08/09/2020: 68/2.15, [name] 26, glucose 145, protein to creatinine ratio 68, ACR 9 She stopped olmesartan due to worsening kidney function and cough.  -+ HL; last set of lipids: Lab Results   Component Value Date   CHOL 130 10/23/2020   HDL 49 10/23/2020   LDLCALC 70 10/23/2020   TRIG 47 10/23/2020   CHOLHDL 2.7 10/23/2020  On Crestor 5  - now every day. Adding Krill oil.  - last eye exam was in 01/01/2021: + DR, + cataract reportedly.  Had laser surgery. Dr. Posey Pronto.   - she has numbness and tingling in her feet.  She also has a history of HTN, GERD, and anemia. In 02/2020, she was admitted multiple perineal abscesses and cellulitis.  These were I&D and she was started on antibiotics. She was in [facility] for 2 weeks - she did not like her care there, including the meals. Of note, patient discussed with nephrology about dialysis and she would never want to proceed with this.  Goiter: A thyroid ultrasound from 2016 showed only small cysts.  Pt denies: - feeling nodules in neck - hoarseness - dysphagia - choking - SOB with lying down  Latest TSH normal: Lab Results  Component Value Date   TSH 1.51 07/17/2021   ROS: + see HPI + Joint aches Neurological: no tremors/+ numbness/+ tingling/no dizziness  I reviewed pt's medications, allergies, PMH, social hx, family hx, and changes were documented in the history of present illness. Otherwise, unchanged from my initial visit note.  Past Medical History:  Diagnosis Date   Anemia    COPD (chronic obstructive pulmonary disease) ([place])    Diabetes mellitus    Hyperlipidemia    Hypertension    Ischemic cardiomyopathy 06/11/2013   Low back pain    NSTEMI (non-ST elevated myocardial infarction) ([place]) 06/11/2013   Obesity    Personal history of colon cancer    Renal insufficiency    Past Surgical History:  Procedure Laterality Date   COLECTOMY     INCISION AND DRAINAGE PERIRECTAL ABSCESS N/A 03/07/2020   Procedure: IRRIGATION AND DEBRIDEMENT PERINEUM  AND PANNUS ABSCESS;  Surgeon: Erroll Luna, MD;  Location: [place];  Service: General;  Laterality: N/A;   IR RADIOLOGIST EVAL & MGMT  08/06/2020   LEFT HEART  CATHETERIZATION WITH CORONARY ANGIOGRAM N/A 06/09/2013   Procedure: LEFT HEART CATHETERIZATION WITH CORONARY ANGIOGRAM;  Surgeon: Ramond Dial, MD;  Location: [facility] CATH LAB;  Service: Cardiovascular;  Laterality: N/A;   History   Social History   Marital Status: Single    Spouse Name: N/A   Number of Children: 0   Occupational History   retired   Social History Main Topics   Smoking status:  former smoker     Types: Cigarettes   Smokeless tobacco: Never Used   Alcohol Use: No   Drug Use: No   Current Outpatient Medications on File Prior to Visit  Medication Sig Dispense Refill   acetaminophen (TYLENOL) 325 MG tablet Take 325 mg by mouth every 6 (six) hours as needed.     allopurinol (ZYLOPRIM) 100 MG tablet Take 50 mg by mouth daily.     amLODipine (NORVASC) 10 MG tablet TAKE ONE-HALF TABLET BY  MOUTH DAILY (Patient taking differently: Take 5 mg by mouth daily.) 45 tablet 3   Ascorbic Acid (  VITAMIN C) 1000 MG tablet Take 1,000 mg by mouth daily.     aspirin 81 MG chewable tablet Chew 1 tablet (81 mg total) by mouth daily.     BYSTOLIC 10 MG tablet TAKE 1 TABLET BY MOUTH EVERY DAY 90 tablet 3   calcium carbonate (OS-CAL) 600 MG TABS tablet Take 600 mg by mouth 2 (two) times daily with a meal.     Cholecalciferol (VITAMIN D3) 50 MCG (2000 UT) capsule Take 1 capsule (2,000 Units total) by mouth daily. 100 capsule 3   ferrous sulfate 325 (65 FE) MG tablet Take 325 mg by mouth 2 (two) times daily with a meal.     glipiZIDE (GLUCOTROL XL) 5 MG 24 hr tablet TAKE 1 TABLET BY MOUTH  DAILY WITH BREAKFAST 90 tablet 3   glucose blood (ONETOUCH ULTRA) test strip USE TO TEST BLOOD SUGAR  TWICE DAILY AS DIRECTED 200 strip 3   hydrALAZINE (APRESOLINE) 25 MG tablet Take 1 tablet (25 mg total) by mouth 3 (three) times daily. 270 tablet 3   HYDROcodone-acetaminophen (NORCO) 5-325 MG tablet Take 1 tablet by mouth every 6 (six) hours as needed for severe pain. 20 tablet 0   lansoprazole (PREVACID)  15 MG capsule Take 1 capsule (15 mg total) by mouth daily at 12 noon. 90 capsule 3   ondansetron (ZOFRAN) 4 MG tablet Take 1 tablet (4 mg total) by mouth every 8 (eight) hours as needed for nausea or vomiting. 20 tablet 0   potassium chloride SA (KLOR-CON) 20 MEQ tablet TAKE 1 TABLET BY MOUTH  DAILY 90 tablet 3   rosuvastatin (CRESTOR) 5 MG tablet Take 1 tablet (5 mg total) by mouth daily. TAKE 1 TABLET BY MOUTH  DAILY AT 6PM 90 tablet 3   torsemide (DEMADEX) 100 MG tablet TAKE 0.5-1 TABLETS (50-100 MG TOTAL) BY MOUTH DAILY. 90 tablet 1   vitamin B-12 (CYANOCOBALAMIN) 1000 MCG tablet Take 1,000 mcg by mouth daily.     No current facility-administered medications on file prior to visit.   Allergies  Allergen Reactions   Tradjenta [Linagliptin] Anaphylaxis    CP   Atorvastatin     REACTION: aches and pains   Enalapril Maleate     REACTION: cough   Farxiga [Dapagliflozin] Itching   Hydrochlorothiazide     REACTION: hair loss   Kenalog [Triamcinolone Acetonide]     HANDS NUMB    Levaquin [Levofloxacin] Hives   Metformin And Related     Diarrhea, dizziness   Propoxyphene N-Acetaminophen Hives   Simvastatin     REACTION: cramps   Spironolactone     REACTION: cramps   Family History  Problem Relation Age of Onset   Hypertension Other    PE: BP 130/62 (BP Location: Left Arm, Patient Position: Sitting, Cuff Size: Normal)    Pulse 83    Ht '5\' 2"'$  (1.575 m)    Wt 187 lb (84.8 kg)    LMP  (LMP Unknown)    SpO2 99%    BMI 34.20 kg/m  Body mass index is 34.2 kg/m. Wt Readings from Last 3 Encounters:  09/11/21 187 lb (84.8 kg)  07/17/21 189 lb 6.4 oz (85.9 kg)  05/12/21 188 lb 6.4 oz (85.5 kg)   Constitutional: overweight, in NAD Eyes: PERRLA, EOMI, no exophthalmos ENT: moist mucous membranes, no thyromegaly, no cervical lymphadenopathy Cardiovascular: RRR, No RG, + 1/6 SEM, + L>R LE edema Respiratory: CTA B Gastrointestinal: abdomen soft, NT, ND, BS+ Musculoskeletal: no  deformities, strength intact  in all 4 Skin: moist, warm, no rashes Neurological: no tremor with outstretched hands, DTR normal in all 4 Diabetic Foot Exam - Simple   Simple Foot Form Diabetic Foot exam was performed with the following findings: Yes 09/11/2021 10:39 AM  Visual Inspection See comments: Yes Sensation Testing Intact to touch and monofilament testing bilaterally: Yes Pulse Check Posterior Tibialis and Dorsalis pulse intact bilaterally: Yes Comments Significant lower leg + periankle pitting edema L>R. Slight decrease in sensation bilaterally. Could not feel posttibial pulses poss. Due to the swelling.     ASSESSMENT: 1. DM2, insulin-independent, uncontrolled, with complications - CAD, s/p AMI 06/2013 - Dr Acie Fredrickson - CHF - CKD - PN - DR - Dr. Jalene Mullet ([facility]) - on IO injections >> improving  2. Goiter - No neck compression symptoms  - 10/19/2014: thyroid ultrasound: Right thyroid lobe: 4.0 x 1.5 x 1.6 cm. Heterogeneous parenchyma with multiple small cysts identified. The largest measures approximately 0.5 x 0.3 x 0.5 cm. All of the small cysts in the right lobe appears simple and benign.  Left thyroid lobe: 4.5 x 1.4 x 2.0 cm. Heterogeneous parenchyma with multiple cysts. The largest measures approximately 1.0 x 0.4 x 0.5 cm. 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 Similar smaller cyst measures 0.8 cm in greatest diameter. There is a small solid nodule measuring 0.7 x 0.4 x 0.6 cm.  Isthmus Thickness: 0.4 cm.  No nodules visualized.  Lymphadenopathy: None visualized.            Multicystic thyroid.  3. HL  PLAN:  1. Patient with longstanding, uncontrolled, type 2 diabetes, on extended release metformin Qsymia only control after switching to a more plant-based diet with reduced processed foods and sweets.  However, she relaxed her diet afterwards and sugars increased.  She refused insulin.  Last year she had  steroids for gout and sugars increased further, up to 400s.  We increased her glipizide dose provisionally.  At last visit, sugars are mostly at goal in the morning but she did have some hyperglycemic spikes even up to 170-180.  Sugars after dinner were at goal.  She was previously drinking tart cherry juice but she realized that this was increasing her blood sugars and switched to capsules.  Sugars improved.  We did not change her regimen at that time. -At today's visit, sugars are excellent, almost all at goal.  I would not suggest a change in regimen although the HbA1c checked today is higher than expected from the log.  Reviewing her blood sugars at home, these are consistent with an HbA1c of 6.5% or lower. -I advised her to: Patient Instructions  Please continue: - Glipizide ER 5 mg before breakfast  Please return in 4-6 months with your sugar log.  - we checked her HbA1c: 7.3% (lower) - advised to check sugars at different times of the day - 1x a day, rotating check times - advised for yearly eye exams >> she is UTD - foot exam performed today - return to clinic in 4-6 months   2. Goiter -No neck compression symptoms -Latest ultrasound report reviewed and she only has small thyroid cysts -Latest TSH was normal: Lab Results  Component Value Date   TSH 1.51 07/17/2021  -No follow-up necessary for now  3. HL -Reviewed latest lipid panel from 10/2020: Fractions at goal with the exception of an LDL higher than our target of less than 55 due to cardiovascular disease Lab Results  Component Value  Date   CHOL 130 10/23/2020   HDL 49 10/23/2020   LDLCALC 70 10/23/2020   TRIG 47 10/23/2020   CHOLHDL 2.7 10/23/2020  -She continues on Crestor 5 mg daily, tolerated well  Philemon Kingdom, MD PhD [facility] Endocrinology

## 2021-09-11 NOTE — Patient Instructions (Addendum)
Please continue: ?- Glipizide ER 5 mg before breakfast ? ?Please return in 4-6 months with your sugar log. ?

## 2021-10-16 DIAGNOSIS — N184 Chronic kidney disease, stage 4 (severe): Secondary | ICD-10-CM | POA: Diagnosis not present

## 2021-10-16 DIAGNOSIS — E1122 Type 2 diabetes mellitus with diabetic chronic kidney disease: Secondary | ICD-10-CM | POA: Diagnosis not present

## 2021-10-16 DIAGNOSIS — M109 Gout, unspecified: Secondary | ICD-10-CM | POA: Diagnosis not present

## 2021-10-16 DIAGNOSIS — I5189 Other ill-defined heart diseases: Secondary | ICD-10-CM | POA: Diagnosis not present

## 2021-10-16 DIAGNOSIS — E785 Hyperlipidemia, unspecified: Secondary | ICD-10-CM | POA: Diagnosis not present

## 2021-10-16 DIAGNOSIS — I129 Hypertensive chronic kidney disease with stage 1 through stage 4 chronic kidney disease, or unspecified chronic kidney disease: Secondary | ICD-10-CM | POA: Diagnosis not present

## 2021-11-04 ENCOUNTER — Telehealth: Payer: Self-pay

## 2021-11-04 NOTE — Chronic Care Management (AMB) (Cosign Needed)
? ? ?Chronic Care Management ?Pharmacy Assistant  ? ?Name: [name]  MRN: [id number] DOB: [birth year] ? ? ? ?Reason for Encounter: Disease State-General Adherence  ?  ? ?Recent office visits:  ?09/11/21 [name] Kingdom, MD-Internal Medicine (Diabetes Mellitus) A1c ordered, pt to continue glipizide er 5 mg before breakfast ? ?07/17/21 [name], Evie Lacks, MD (Iron deficiency anemia) Blood work ordered:CMP,CBC,Iron,TIBC and Ferritin panel ? ?1107/22 [name] Kingdom, MD-Internal Medicine (Diabetes Mellitus) Orders: Fructosamine, Poct glycosylated hemoglobin (Hb A1c) ? ?04/10/21 [name], Evie Lacks, MD-PCP (low back pain with sciatica)Med changes: Hydrocodone acetaminophen 5-325 mg ? ?Recent consult visits:  ?None since last coordination call ? ?Hospital visits:  ?None in previous 6 months ? ?Medications: ?Outpatient Encounter Medications as of 11/04/2021  ?Medication Sig  ? acetaminophen (TYLENOL) 325 MG tablet Take 325 mg by mouth every 6 (six) hours as needed.  ? allopurinol (ZYLOPRIM) 100 MG tablet Take 50 mg by mouth daily.  ? amLODipine (NORVASC) 10 MG tablet TAKE ONE-HALF TABLET BY  MOUTH DAILY (Patient taking differently: Take 5 mg by mouth daily.)  ? Ascorbic Acid (VITAMIN C) 1000 MG tablet Take 1,000 mg by mouth daily.  ? aspirin 81 MG chewable tablet Chew 1 tablet (81 mg total) by mouth daily.  ? BYSTOLIC 10 MG tablet TAKE 1 TABLET BY MOUTH EVERY DAY  ? calcium carbonate (OS-CAL) 600 MG TABS tablet Take 600 mg by mouth 2 (two) times daily with a meal.  ? Cholecalciferol (VITAMIN D3) 50 MCG (2000 UT) capsule Take 1 capsule (2,000 Units total) by mouth daily.  ? ferrous sulfate 325 (65 FE) MG tablet Take 325 mg by mouth 2 (two) times daily with a meal.  ? glipiZIDE (GLUCOTROL XL) 5 MG 24 hr tablet TAKE 1 TABLET BY MOUTH  DAILY WITH BREAKFAST  ? glucose blood (ONETOUCH ULTRA) test strip USE TO TEST BLOOD SUGAR  TWICE DAILY AS DIRECTED  ? hydrALAZINE (APRESOLINE) 25 MG tablet Take 1 tablet (25 mg  total) by mouth 3 (three) times daily.  ? HYDROcodone-acetaminophen (NORCO) 5-325 MG tablet Take 1 tablet by mouth every 6 (six) hours as needed for severe pain.  ? lansoprazole (PREVACID) 15 MG capsule Take 1 capsule (15 mg total) by mouth daily at 12 noon.  ? ondansetron (ZOFRAN) 4 MG tablet Take 1 tablet (4 mg total) by mouth every 8 (eight) hours as needed for nausea or vomiting.  ? potassium chloride SA (KLOR-CON) 20 MEQ tablet TAKE 1 TABLET BY MOUTH  DAILY  ? rosuvastatin (CRESTOR) 5 MG tablet Take 1 tablet (5 mg total) by mouth daily. TAKE 1 TABLET BY MOUTH  DAILY AT 6PM  ? torsemide (DEMADEX) 100 MG tablet TAKE 0.5-1 TABLETS (50-100 MG TOTAL) BY MOUTH DAILY.  ? vitamin B-12 (CYANOCOBALAMIN) 1000 MCG tablet Take 1,000 mcg by mouth daily.  ? ?No facility-administered encounter medications on file as of 11/04/2021.  ? ?[place] for General Review Call ? ? ?Chart Review: ? ?Have there been any documented new, changed, or discontinued medications since last visit? No (If yes, include name, dose, frequency, date) ?Has there been any documented recent hospitalizations or ED visits since last visit with Clinical Pharmacist? No ?Brief Summary (including medication and/or Diagnosis changes): ? ? ?Adherence Review: ? ?Does the Clinical Pharmacist Assistant have access to adherence rates? Yes ?Adherence rates for STAR metric medications (List medication(s)/day supply/ last 2 fill dates). ?Adherence rates for medications indicated for disease state being reviewed (List medication(s)/day supply/ last 2 fill dates). ?Does the  patient have >5 day gap between last estimated fill dates for any of the above medications or other medication gaps? Yes ?Reason for medication gaps.Patient states she has some on hand.  ? ? ?Disease State Questions: ? ?Able to connect with Patient? Yes ?Did patient have any problems with their health recently? No ?Note problems and Concerns: ?Have you had any admissions or  emergency room visits or worsening of your condition(s) since last visit? No ?Details of ED visit, hospital visit and/or worsening condition(s): ?Have you had any visits with new specialists or providers since your last visit? No ?Explain: ?Have you had any new health care problem(s) since your last visit? No ?New problem(s) reported: ?Have you run out of any of your medications since you last spoke with clinical pharmacist? No ?What caused you to run out of your medications? ?Are there any medications you are not taking as prescribed? No ?What kept you from taking your medications as prescribed? ?Are you having any issues or side effects with your medications? No ?Note of issues or side effects: ?Do you have any other health concerns or questions you want to discuss with your Clinical Pharmacist before your next visit? No ?Note additional concerns and questions from Patient. ?Are there any health concerns that you feel we can do a better job addressing? No ?Note Patient's response. ?Are you having any problems with any of the following since the last visit: (select all that apply) ? None ? Details: ?12. Any falls since last visit? No ? Details: ?13. Any increased or uncontrolled pain since last visit? Yes ? Details:Patient states that she is still dealing with Gout but she is taking the allopurinol ?14. Next visit Type: telephone ?      Visit with:Clinical Pharmacist ?       Date:01/29/22 ?       Time:9am ? ?15. Additional Details? No ?   ?Care Gaps: ?Colonoscopy-09/29/17 ?Diabetic Foot Exam-09/11/21 ?Mammogram-01/10/20 ?Ophthalmology-03/10/16 ?Dexa Scan - NA ?Annual Well Visit - NA ?Micro albumin-NA ?Hemoglobin A1c- 09/11/21 ? ?Star Rating Drugs: ?Rosuvastatin 5 mg-last fill 07/27/21 ? ?[name] ?Clinical Pharmacist Assistant ?[phone]  ?

## 2021-11-05 DIAGNOSIS — E113513 Type 2 diabetes mellitus with proliferative diabetic retinopathy with macular edema, bilateral: Secondary | ICD-10-CM | POA: Diagnosis not present

## 2021-11-05 DIAGNOSIS — H31093 Other chorioretinal scars, bilateral: Secondary | ICD-10-CM | POA: Diagnosis not present

## 2021-11-05 DIAGNOSIS — H25813 Combined forms of age-related cataract, bilateral: Secondary | ICD-10-CM | POA: Diagnosis not present

## 2021-11-05 DIAGNOSIS — H3582 Retinal ischemia: Secondary | ICD-10-CM | POA: Diagnosis not present

## 2021-11-17 ENCOUNTER — Ambulatory Visit: Payer: Commercial Managed Care - HMO | Admitting: Internal Medicine

## 2021-11-19 ENCOUNTER — Other Ambulatory Visit: Payer: Self-pay | Admitting: Internal Medicine

## 2021-11-19 DIAGNOSIS — Z1231 Encounter for screening mammogram for malignant neoplasm of breast: Secondary | ICD-10-CM

## 2021-11-25 ENCOUNTER — Encounter: Payer: Self-pay | Admitting: Internal Medicine

## 2021-11-25 ENCOUNTER — Ambulatory Visit
Admission: RE | Admit: 2021-11-25 | Discharge: 2021-11-25 | Disposition: A | Discharge status: Home / Self Care | Payer: Medicare Other | Source: Ambulatory Visit | Attending: Internal Medicine | Admitting: Internal Medicine

## 2021-11-25 ENCOUNTER — Ambulatory Visit (INDEPENDENT_AMBULATORY_CARE_PROVIDER_SITE_OTHER): Payer: Medicare Other | Admitting: Internal Medicine

## 2021-11-25 DIAGNOSIS — Z1231 Encounter for screening mammogram for malignant neoplasm of breast: Secondary | ICD-10-CM

## 2021-11-25 DIAGNOSIS — R600 Localized edema: Secondary | ICD-10-CM | POA: Insufficient documentation

## 2021-11-25 DIAGNOSIS — E1159 Type 2 diabetes mellitus with other circulatory complications: Secondary | ICD-10-CM | POA: Diagnosis not present

## 2021-11-25 DIAGNOSIS — E1122 Type 2 diabetes mellitus with diabetic chronic kidney disease: Secondary | ICD-10-CM | POA: Diagnosis not present

## 2021-11-25 DIAGNOSIS — I110 Hypertensive heart disease with heart failure: Secondary | ICD-10-CM

## 2021-11-25 DIAGNOSIS — I5042 Chronic combined systolic (congestive) and diastolic (congestive) heart failure: Secondary | ICD-10-CM | POA: Diagnosis not present

## 2021-11-25 DIAGNOSIS — I152 Hypertension secondary to endocrine disorders: Secondary | ICD-10-CM

## 2021-11-25 DIAGNOSIS — N184 Chronic kidney disease, stage 4 (severe): Secondary | ICD-10-CM

## 2021-11-25 NOTE — Assessment & Plan Note (Signed)
F/u w/Nephrology 

## 2021-11-25 NOTE — Assessment & Plan Note (Signed)
Monitor BP at home

## 2021-11-25 NOTE — Assessment & Plan Note (Addendum)
S/p L leg injuruy 1 mo ago - fell off the counter and hit a stove door w/her L lat leg. LLE aw/a line of demarcation between swelling and no swelling laterally. Doppler US to r/o DVT Use Compression socks

## 2021-11-25 NOTE — Assessment & Plan Note (Signed)
Continue with amlodipine, torsemide, Bystolic, Norvasc, Hydralazine, Torsemide  

## 2021-11-25 NOTE — Progress Notes (Signed)
Subjective:  Patient ID: [name], female    DOB: 1950-11-15  Age: 71 y.o. MRN: [id number]  CC: Follow-up (])   HPI [name] presents for L leg injury 1 mo ago - fell off the counter and hit a stove door w/her L lat leg. LLE aw/a line of demarcation between swelling and no swelling laterally.  Outpatient Medications Prior to Visit  Medication Sig Dispense Refill   acetaminophen (TYLENOL) 325 MG tablet Take 325 mg by mouth every 6 (six) hours as needed.     allopurinol (ZYLOPRIM) 100 MG tablet Take 50 mg by mouth daily.     amLODipine (NORVASC) 10 MG tablet TAKE ONE-HALF TABLET BY  MOUTH DAILY (Patient taking differently: Take 5 mg by mouth daily.) 45 tablet 3   Ascorbic Acid (VITAMIN C) 1000 MG tablet Take 1,000 mg by mouth daily.     aspirin 81 MG chewable tablet Chew 1 tablet (81 mg total) by mouth daily.     BYSTOLIC 10 MG tablet TAKE 1 TABLET BY MOUTH EVERY DAY 90 tablet 3   calcium carbonate (OS-CAL) 600 MG TABS tablet Take 600 mg by mouth 2 (two) times daily with a meal.     Cholecalciferol (VITAMIN D3) 50 MCG (2000 UT) capsule Take 1 capsule (2,000 Units total) by mouth daily. 100 capsule 3   ferrous sulfate 325 (65 FE) MG tablet Take 325 mg by mouth 2 (two) times daily with a meal.     glipiZIDE (GLUCOTROL XL) 5 MG 24 hr tablet TAKE 1 TABLET BY MOUTH  DAILY WITH BREAKFAST 90 tablet 3   glucose blood (ONETOUCH ULTRA) test strip USE TO TEST BLOOD SUGAR  TWICE DAILY AS DIRECTED 200 strip 3   hydrALAZINE (APRESOLINE) 25 MG tablet Take 1 tablet (25 mg total) by mouth 3 (three) times daily. 270 tablet 3   lansoprazole (PREVACID) 15 MG capsule Take 1 capsule (15 mg total) by mouth daily at 12 noon. 90 capsule 3   ondansetron (ZOFRAN) 4 MG tablet Take 1 tablet (4 mg total) by mouth every 8 (eight) hours as needed for nausea or vomiting. 20 tablet 0   potassium chloride SA (KLOR-CON) 20 MEQ tablet TAKE 1 TABLET BY MOUTH  DAILY 90 tablet 3   rosuvastatin  (CRESTOR) 5 MG tablet Take 1 tablet (5 mg total) by mouth daily. TAKE 1 TABLET BY MOUTH  DAILY AT 6PM 90 tablet 3   torsemide (DEMADEX) 100 MG tablet TAKE 0.5-1 TABLETS (50-100 MG TOTAL) BY MOUTH DAILY. 90 tablet 1   vitamin B-12 (CYANOCOBALAMIN) 1000 MCG tablet Take 1,000 mcg by mouth daily.     HYDROcodone-acetaminophen (NORCO) 5-325 MG tablet Take 1 tablet by mouth every 6 (six) hours as needed for severe pain. (Patient not taking: Reported on 11/25/2021) 20 tablet 0   No facility-administered medications prior to visit.    ROS: Review of Systems  Constitutional:  Positive for fatigue.   Objective:  BP (!) 148/82 (BP Location: Left Arm, Patient Position: Sitting, Cuff Size: Large)   Pulse 71   Temp 98.4 F (36.9 C) (Oral)   Ht '5\' 2"'$  (1.575 m)   Wt 187 lb 9.6 oz (85.1 kg)   LMP  (LMP Unknown)   SpO2 99%   BMI 34.31 kg/m   BP Readings from Last 3 Encounters:  11/25/21 (!) 148/82  09/11/21 130/62  07/17/21 130/70    Wt Readings from Last 3 Encounters:  11/25/21 187 lb 9.6 oz (85.1 kg)  09/11/21  187 lb (84.8 kg)  07/17/21 189 lb 6.4 oz (85.9 kg)    Physical Exam Constitutional:      General: She is not in acute distress.    Appearance: She is well-developed. She is obese.  HENT:     Head: Normocephalic.     Right Ear: External ear normal.     Left Ear: External ear normal.     Nose: Nose normal.  Eyes:     General:        Right eye: No discharge.        Left eye: No discharge.     Conjunctiva/sclera: Conjunctivae normal.     Pupils: Pupils are equal, round, and reactive to light.  Neck:     Thyroid: No thyromegaly.     Vascular: No JVD.     Trachea: No tracheal deviation.  Cardiovascular:     Rate and Rhythm: Normal rate and regular rhythm.     Heart sounds: Normal heart sounds.  Pulmonary:     Effort: No respiratory distress.     Breath sounds: No stridor. No wheezing.  Abdominal:     General: Bowel sounds are normal. There is no distension.      Palpations: Abdomen is soft. There is no mass.     Tenderness: There is no abdominal tenderness. There is no guarding or rebound.  Musculoskeletal:        General: No tenderness.     Cervical back: Normal range of motion and neck supple. No rigidity.     Right lower leg: Edema present.     Left lower leg: Edema present.  Lymphadenopathy:     Cervical: No cervical adenopathy.  Skin:    Findings: No erythema or rash.  Neurological:     Mental Status: She is oriented to person, place, and time.     Cranial Nerves: No cranial nerve deficit.     Motor: No abnormal muscle tone.     Coordination: Coordination normal.     Gait: Gait abnormal.     Deep Tendon Reflexes: Reflexes normal.  Psychiatric:        Behavior: Behavior normal.        Thought Content: Thought content normal.        Judgment: Judgment normal.    LLE w/a line of demarcation between swelling and no swelling 2+ laterally. NT. RLE - trace edema   Lab Results  Component Value Date   WBC 4.7 07/17/2021   HGB 9.5 (L) 07/17/2021   HCT 29.8 (L) 07/17/2021   PLT 143.0 (L) 07/17/2021   GLUCOSE 170 (H) 07/17/2021   CHOL 130 10/23/2020   TRIG 47 10/23/2020   HDL 49 10/23/2020   LDLCALC 70 10/23/2020   ALT 13 07/17/2021   AST 14 07/17/2021   NA 137 07/17/2021   K 4.3 07/17/2021   CL 104 07/17/2021   CREATININE 3.14 (H) 07/17/2021   BUN 75 (H) 07/17/2021   CO2 24 07/17/2021   TSH 1.51 07/17/2021   INR 1.1 06/17/2020   HGBA1C 7.3 (A) 09/11/2021   MICROALBUR 13.9 (H) 10/13/2007    No results found.  Assessment & Plan:   Problem List Items Addressed This Visit     Hypertensive heart disease with congestive heart failure (HCC)    Monitor BP at home       Leg edema     S/p L leg injuruy 1 mo ago - fell off the counter and hit a stove door w/her L lat  leg. LLE aw/a line of demarcation between swelling and no swelling laterally. 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     Follow-up: Return for a follow-up visit.  Walker Kehr, MD

## 2021-11-27 ENCOUNTER — Other Ambulatory Visit: Payer: Self-pay | Admitting: Internal Medicine

## 2021-11-27 ENCOUNTER — Ambulatory Visit (HOSPITAL_COMMUNITY)
Admission: RE | Admit: 2021-11-27 | Discharge: 2021-11-27 | Disposition: A | Discharge status: Home / Self Care | Payer: Medicare Other | Source: Ambulatory Visit | Attending: Internal Medicine | Admitting: Internal Medicine

## 2021-11-27 DIAGNOSIS — R600 Localized edema: Secondary | ICD-10-CM | POA: Diagnosis not present

## 2021-11-27 MED ORDER — APIXABAN 5 MG PO TABS: 5.0000 mg | ORAL_TABLET | Freq: Two times a day (BID) | ORAL | 5 refills | Status: DC

## 2021-11-27 NOTE — Progress Notes (Signed)
LLE DVT - start Eliquis. Stop ASA

## 2021-12-04 ENCOUNTER — Telehealth: Payer: Self-pay

## 2021-12-04 NOTE — Chronic Care Management (AMB) (Signed)
Chronic Care Management Pharmacy Assistant   Name: [name]  MRN: [id number] DOB: 11/04/1950    Reason for Encounter: Disease State-General    Recent office visits:  11/25/21 [name], Evie Lacks, MD-PCP (Leg edema) Orders: VAS [place] LOWER EXTREMITY VENOUS (DVT) Medication changes: None  Recent consult visits:  None since last coordination call 11/04/21  Hospital visits:  None since last coordination call 11/04/21  Medications: Outpatient Encounter Medications as of 12/04/2021  Medication Sig   acetaminophen (TYLENOL) 325 MG tablet Take 325 mg by mouth every 6 (six) hours as needed.   allopurinol (ZYLOPRIM) 100 MG tablet Take 50 mg by mouth daily.   amLODipine (NORVASC) 10 MG tablet TAKE ONE-HALF TABLET BY  MOUTH DAILY (Patient taking differently: Take 5 mg by mouth daily.)   apixaban (ELIQUIS) 5 MG TABS tablet Take 1 tablet (5 mg total) by mouth 2 (two) times daily.   Ascorbic Acid (VITAMIN C) 1000 MG tablet Take 1,000 mg by mouth daily.   BYSTOLIC 10 MG tablet TAKE 1 TABLET BY MOUTH EVERY DAY   calcium carbonate (OS-CAL) 600 MG TABS tablet Take 600 mg by mouth 2 (two) times daily with a meal.   Cholecalciferol (VITAMIN D3) 50 MCG (2000 UT) capsule Take 1 capsule (2,000 Units total) by mouth daily.   ferrous sulfate 325 (65 FE) MG tablet Take 325 mg by mouth 2 (two) times daily with a meal.   glipiZIDE (GLUCOTROL XL) 5 MG 24 hr tablet TAKE 1 TABLET BY MOUTH  DAILY WITH BREAKFAST   glucose blood (ONETOUCH ULTRA) test strip USE TO TEST BLOOD SUGAR  TWICE DAILY AS DIRECTED   hydrALAZINE (APRESOLINE) 25 MG tablet Take 1 tablet (25 mg total) by mouth 3 (three) times daily.   HYDROcodone-acetaminophen (NORCO) 5-325 MG tablet Take 1 tablet by mouth every 6 (six) hours as needed for severe pain. (Patient not taking: Reported on 11/25/2021)   lansoprazole (PREVACID) 15 MG capsule Take 1 capsule (15 mg total) by mouth daily at 12 noon.   ondansetron (ZOFRAN) 4 MG tablet Take 1 tablet  (4 mg total) by mouth every 8 (eight) hours as needed for nausea or vomiting.   potassium chloride SA (KLOR-CON) 20 MEQ tablet TAKE 1 TABLET BY MOUTH  DAILY   rosuvastatin (CRESTOR) 5 MG tablet Take 1 tablet (5 mg total) by mouth daily. TAKE 1 TABLET BY MOUTH  DAILY AT 6PM   torsemide (DEMADEX) 100 MG tablet TAKE 0.5-1 TABLETS (50-100 MG TOTAL) BY MOUTH DAILY.   vitamin B-12 (CYANOCOBALAMIN) 1000 MCG tablet Take 1,000 mcg by mouth daily.   No facility-administered encounter medications on file as of 12/04/2021.    [place] [facility] Review Call   Chart Review:  Have there been any documented new, changed, or discontinued medications since last visit? No (If yes, include name, dose, frequency, date) Has there been any documented recent hospitalizations or ED visits since last visit with Clinical Pharmacist? No Brief Summary (including medication and/or Diagnosis changes):   Adherence Review:  Does the Clinical Pharmacist Assistant have access to adherence rates? Yes Adherence rates for STAR metric medications (List medication(s)/day supply/ last 2 fill dates).Star listing below Adherence rates for medications indicated for disease state being reviewed (List medication(s)/day supply/ last 2 fill dates). Does the patient have >5 day gap between last estimated fill dates for any of the above medications or other medication gaps? No Reason for medication gaps.   Disease State Questions:  Able to connect  with Patient? Yes  Did patient have any problems with their health recently? No Note problems and Concerns:  Have you had any admissions or emergency room visits or worsening of your condition(s) since last visit? No, none since last coordination call 11/04/21  Have you had any visits with new specialists or providers since your last visit? No, none since last coordination call 11/04/21  Have you had any new health care problem(s) since your last visit? No New  problem(s) reported:  Have you run out of any of your medications since you last spoke with clinical pharmacist? No What caused you to run out of your medications?  Are there any medications you are not taking as prescribed? No What kept you from taking your medications as prescribed?  Are you having any issues or side effects with your medications? No Note of issues or side effects:  Do you have any other health concerns or questions you want to discuss with your Clinical Pharmacist before your next visit? Yes Note additional concerns and questions from Patient. Patient would like to know how long she is suppose to stay on Eliquis. She states that she started last Thursday and on her bottle it has 5 refills. She states that she did not realize that she needed for that long.  Are there any health concerns that you feel we can do a better job addressing? No Note Patient's response.  Are you having any problems with any of the following since the last visit: (select all that apply)  None  Details:  12. Any falls since last visit? No  Details:  13. Any increased or uncontrolled pain since last visit? No  Details:  14. Next visit Type: telephone       Visit with:Clinical Pharmacist        Date:01/29/22        Time:9 am  15. Additional Details? No    Care Gaps: Colonoscopy-09/29/17 Diabetic Foot Exam-09/11/21 Mammogram-01/10/20 Ophthalmology-03/10/16 Dexa Scan - NA Annual Well Visit - NA Micro albumin-NA Hemoglobin A1c- 09/11/21   Star Rating Drugs: Rosuvastatin 5 mg-last fill 11/03/21 90 ds   [name] Clinical Pharmacist Assistant [phone]

## 2021-12-16 ENCOUNTER — Ambulatory Visit (INDEPENDENT_AMBULATORY_CARE_PROVIDER_SITE_OTHER): Payer: Medicare Other | Admitting: Cardiovascular Disease

## 2021-12-16 ENCOUNTER — Encounter: Payer: Self-pay | Admitting: Cardiovascular Disease

## 2021-12-16 VITALS — BP 112/60 | HR 77 | Ht 62.0 in | Wt 182.8 lb

## 2021-12-16 DIAGNOSIS — E782 Mixed hyperlipidemia: Secondary | ICD-10-CM | POA: Diagnosis not present

## 2021-12-16 DIAGNOSIS — I82462 Acute embolism and thrombosis of left calf muscular vein: Secondary | ICD-10-CM

## 2021-12-16 DIAGNOSIS — I2510 Atherosclerotic heart disease of native coronary artery without angina pectoris: Secondary | ICD-10-CM | POA: Diagnosis not present

## 2021-12-16 NOTE — Patient Instructions (Signed)
Medication Instructions:  HOLD Aspirin while on Eliquis *If you need a refill on your cardiac medications before your next appointment, please call your pharmacy*   Lab Work: LIPIDS, BMET, ALT Today If you have labs (blood work) drawn today and your tests are completely normal, you will receive your results only by: [place] (if you have MyChart) OR A paper copy in the mail If you have any lab test that is abnormal or we need to change your treatment, we will call you to review the results.   Testing/Procedures: NONE   Follow-Up: At [facility], you and your health needs are our priority.  As part of our continuing mission to provide you with exceptional heart care, we have created designated Provider Care Teams.  These Care Teams include your primary Cardiologist (physician) and Advanced Practice Providers (APPs -  Physician Assistants and Nurse Practitioners) who all work together to provide you with the care you need, when you need it.  We recommend signing up for the patient portal called "MyChart".  Sign up information is provided on this After Visit Summary.  MyChart is used to connect with patients for Virtual Visits (Telemedicine).  Patients are able to view lab/test results, encounter notes, upcoming appointments, etc.  Non-urgent messages can be sent to your provider as well.   To learn more about what you can do with MyChart, go to [email or web address].    Your next appointment:   1 year(s)  The format for your next appointment:   In Person  Provider:   Ronn Melena, or Nahser {     Important Information About Sugar

## 2021-12-16 NOTE — Progress Notes (Signed)
Cardiology Office Note   Date:  12/16/2021   ID:  [name], DOB 17-Jun-1951, MRN [id number]  PCP:  [name] Anger, MD  Cardiologist:   [name] Moores, MD   Chief Complaint  Patient presents with   Coronary Artery Disease   Congestive Heart Failure        1. Coronary artery disease-status post PTCA and stenting of her mid LAD using a 3.5 x 15 mm Alpine stent ( DES) ( Dec. 5, 2014)   2. chronic systolic congestive heart failure-ejection fraction of 35% 2. Hypertension 3. COPD 4. Diabetes mellitus 5. History colon cancer    [name] is seen today. She was initially seen by Dr. Mare [name] in the hospital. She has a history of chronic systolic congestive heart failure with an ejection fraction of around 35%. She has a history of coronary artery disease. She is status post PTCA and stenting of her mid left anterior descending artery using a 3.5 x 15 mm Alpine stent.   She has a history of COPD. [name] not had a cigarette since he left the hospital. She also has a history of diabetes mellitus and history of colon cancer  She has mild interscapular pain this morning .  And the pain resolved after she drank some club soda.  Did not feel like her MI pain.    She has been walking every morining at the mall and at [place].   Her BP is up today.   - she may have had lots of salt last night.   The BP has remained high   September 20, 2013  [name] has been having some episodes of chest discomfort. She initially thought it might be due to indigestion or due to something that she ate.  It radiated to her interscapular region. It was associated with some hand and normal tingling. The pain was intermittent but lasted for several hours.   She took a sublingual nitroglycerin and the pain resolved very quickly and she did not have any further episodes the whole rest of the day.  She informed that she ran out of her Lasix 3 days ago.  November 01, 2013:  [name] is feeling well.  She  stopped smoking the day of her cath.  She is eating more "penny candy" to replace the habit.   January 31, 2014:  She is doing ok.  She has no angina.  Not exercising.  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     She has a bruise on her leg where she bumped into a table.   Oct. 29, 2015:  [name] is doing very well. She is not having any episodes of chest pain. [name] not exercising quite as much as she would like. Stent was placed in December of 2014.  She had a nose bleed last week. ( she is on Effient until Dec. 2015).  Her Bp was  A bit elevated because of some salt in her diet.    No CP or dyspnea    November 02, 2014:  [name] is a 71 y.o. female who presents for follow-up of her coronary artery disease. [name] done fairly well. She has had some muscle pain and a rash. She has occasional episodes of dizziness. She had some non-cardiac CP several weeks ago when she started Trajenta.  Stopped the med and has not had any further episodes of CP.    Oct. 26, 2016  [name] is doing well She had a echo  recently   Left ventricle: The cavity size was normal. Wall thickness was   increased in a pattern of mild LVH with focal basal septal   hypertrophy. Systolic function was normal. The estimated ejection   fraction was in the range of 60% to 65%. Wall motion was normal;   there were no regional wall motion abnormalities. Doppler   parameters are consistent with abnormal left ventricular   relaxation (grade 1 diastolic dysfunction). - Aortic valve: There was no stenosis. There was trivial   regurgitation. - Mitral valve: Mildly calcified annulus. Mildly calcified leaflets   . There was no significant regurgitation. - Right ventricle: The cavity size was normal. Systolic function   was normal. - Tricuspid valve: Peak RV-RA gradient (S): 21 mm Hg. - Pulmonary arteries: PA peak pressure: 24 mm Hg (S). - Inferior vena cava: The vessel was normal in size. The   respirophasic diameter  changes were in the normal range (>= 50%),   consistent with normal central venous pressure  [name] is doing well  BP has been elevated for the past 2 weeks.  Has had some left arm pain / ached Left arm aches all the time,  Constant.  Worse with sitting, not worsened by exercise .   October 28, 2015:  [name] is doing well.  She went with [facility] - it took 2 weeks for them to get her meds to her .  Has chronic left arm pain .  Oct. 23, 2017:  Doing well from a cardiac standpoint Having eye surgery . Has had a chronic cough [name], [name] Lacks, MD changed her Coreg to [facility] Did not seem to help the cough   Jan. 24, 2018:  doing well. Still eating some salty foods.  Still smoking some,  Advised her to quit.  Echo from 2016 shows normal LV systolic function Needs to have an contrast imaging study of her eye. She is at low risk for that procedure.   Aug. 17, 2018:  [name] is seen back today for follow up of her CHF   Dec 03, 2017:    [name] is seen back for follow up of her CHF and HTN Doing well. EF is now normal.   Gets some exercise - not as much as she would like  Has some ankle swelling  Has had some arm pain that she associated with the Losartan ( MSK pain is mentioned in Ep[place]es)   Nov. 6, 2019:  [name] is seen for follow up visit  BP is well controlled  Had an episodes of CP while very upset ( her brother had just had a aortic dissection ( 10.5 hours of surgery )  Brother is 70 yo .    No further episodes of CP after the stress  Has a rash on the left side of the chest  She was not tolerating the micardis ( cough)  She started taking hydralazine on September 26.  She now has developed a rash on the left side of her chest.  She was started on torsemide instead of furosemide by her primary medical doctor.  She does okay when she takes the 50 mg of torsemide.  When she takes 100 mg of torsemide she has severe muscle cramping.  Nov. 9, 2020 :  No cp. No  dyspnea Had some coughing in sept when she was exposed to marijuana smoke from neighbors.   Had eye surgery last week.    October 23, 2020:  [name] is seen today for  follow-up of her coronary artery disease and hyperlipidemia.  She has a history of hypertension.  She has grade 1 diastolic dysfunction. No CP ,  No dyspnea.  No bacon in 4 months  Was hospitalized with sepsis several months ago.  Started as an abscess on her legs.  She wound up in a nursing home because of profound weakness.  She is now walking quite a bit better. Still smoking . I recommended cessation .  1. Coronary artery disease-status post PTCA and stenting of her mid LAD using a 3.5 x 15 mm Alpine stent ( DES) ( Dec. 5, 2014)   2. chronic systolic congestive heart failure-ejection fraction of 35% 2. Hypertension 3. COPD  Echo from 2020 shows normal LV systolic function .   December 16, 2021:   [name] seen today for follow-up of her coronary artery disease.  She has a history of hypertension and chronic grade 1 diastolic dysfunction.  Doing well Walking quite a bit .  No cp. Breathing is ok  Had a fall and then Had a DVT in her left leg a month ago,  now is on Eliquis 5 BID    Past Medical History:  Diagnosis Date   Anemia    COPD (chronic obstructive pulmonary disease) ([place])    Diabetes mellitus    Hyperlipidemia    Hypertension    Ischemic cardiomyopathy 06/11/2013   Low back pain    NSTEMI (non-ST elevated myocardial infarction) ([place]) 06/11/2013   Obesity    Personal history of colon cancer    Renal insufficiency     Past Surgical History:  Procedure Laterality Date   COLECTOMY     INCISION AND DRAINAGE PERIRECTAL ABSCESS N/A 03/07/2020   Procedure: IRRIGATION AND DEBRIDEMENT PERINEUM  AND PANNUS ABSCESS;  Surgeon: Erroll Luna, MD;  Location: [facility];  Service: General;  Laterality: N/A;   IR RADIOLOGIST EVAL & MGMT  08/06/2020   LEFT HEART CATHETERIZATION WITH CORONARY ANGIOGRAM N/A 06/09/2013    Procedure: LEFT HEART CATHETERIZATION WITH CORONARY ANGIOGRAM;  Surgeon: Ramond Dial, MD;  Location[facility] C[facility] LAB;  Service: Cardiovascular;  Laterality: N/A;     Current Outpatient Medications  Medication Sig Dispense Refill   acetaminophen (TYLENOL) 325 MG tablet Take 325 mg by mouth every 6 (six) hours as needed.     allopurinol (ZYLOPRIM) 100 MG tablet Take 50 mg by mouth daily.     amLODipine (NORVASC) 10 MG tablet TAKE ONE-HALF TABLET BY  MOUTH DAILY 45 tablet 3   apixaban (ELIQUIS) 5 MG TABS tablet Take 1 tablet (5 mg total) by mouth 2 (two) times daily. 60 tablet 5   Ascorbic Acid (VITAMIN C) 1000 MG tablet Take 1,000 mg by mouth daily.     BYSTOLIC 10 MG tablet TAKE 1 TABLET BY MOUTH EVERY DAY 90 tablet 3   calcium carbonate (OS-CAL) 600 MG TABS tablet Take 600 mg by mouth 2 (two) times daily with a meal.     Cholecalciferol (VITAMIN D3) 50 MCG (2000 UT) capsule Take 1 capsule (2,000 Units total) by mouth daily. 100 capsule 3   ferrous sulfate 325 (65 FE) MG tablet Take 325 mg by mouth 2 (two) times daily with a meal.     glipiZIDE (GLUCOTROL XL) 5 MG 24 hr tablet TAKE 1 TABLET BY MOUTH  DAILY WITH BREAKFAST 90 tablet 3   glucose blood (ONETOUCH ULTRA) test strip USE TO TEST BLOOD SUGAR  TWICE DAILY AS DIRECTED 200 strip 3   hydrALAZINE (  APRESOLINE) 25 MG tablet Take 1 tablet (25 mg total) by mouth 3 (three) times daily. 270 tablet 3   lansoprazole (PREVACID) 15 MG capsule Take 1 capsule (15 mg total) by mouth daily at 12 noon. 90 capsule 3   ondansetron (ZOFRAN) 4 MG tablet Take 1 tablet (4 mg total) by mouth every 8 (eight) hours as needed for nausea or vomiting. 20 tablet 0   potassium chloride SA (KLOR-CON) 20 MEQ tablet TAKE 1 TABLET BY MOUTH  DAILY 90 tablet 3   rosuvastatin (CRESTOR) 5 MG tablet Take 1 tablet (5 mg total) by mouth daily. TAKE 1 TABLET BY MOUTH  DAILY AT 6PM 90 tablet 3   torsemide (DEMADEX) 100 MG tablet TAKE 0.5-1 TABLETS (50-100 MG TOTAL) BY MOUTH DAILY.  90 tablet 1   vitamin B-12 (CYANOCOBALAMIN) 1000 MCG tablet Take 1,000 mcg by mouth daily.     No current facility-administered medications for this visit.    Allergies:   Tradjenta [linagliptin], Atorvastatin, Enalapril maleate, Farxiga [dapagliflozin], Hydrochlorothiazide, Kenalog [triamcinolone acetonide], Levaquin [levofloxacin], Metformin and related, Propoxyphene n-acetaminophen, Simvastatin, and Spironolactone    Social History:  The patient  reports that she has been smoking cigarettes. She has never used smokeless tobacco. She reports that she does not drink alcohol and does not use drugs.   Family History:  The patient's family history includes Hypertension in an other family member.    ROS:   Noted in current hx , otherwise negative    Physical Exam: Blood pressure 112/60, pulse 77, height '5\' 2"'$  (1.575 m), weight 182 lb 12.8 oz (82.9 kg), SpO2 99 %.  GEN:  Well nourished, well developed in no acute distress HEENT: Normal NECK: No JVD; No carotid bruits LYMPHATICS: No lymphadenopathy CARDIAC: RRR , no murmurs, rubs, gallops RESPIRATORY:  Clear to auscultation without rales, wheezing or rhonchi  ABDOMEN: Soft, non-tender, non-distended MUSCULOSKELETAL: Left leg has 1-2+ pitting edema.  She also small knot in the calf of her left leg about the size of a walnut. SKIN: Warm and dry NEUROLOGIC:  Alert and oriented x 3    EKG:      December 16, 2021: Normal sinus rhythm at 77.  No ST or T wave changes.   Recent Labs: 07/17/2021: ALT 13; BUN 75; Creatinine, Ser 3.14; Hemoglobin 9.5; Platelets 143.0; Potassium 4.3; Sodium 137; TSH 1.51    Lipid Panel    Component Value Date/Time   CHOL 130 10/23/2020 1503   TRIG 47 10/23/2020 1503   HDL 49 10/23/2020 1503   CHOLHDL 2.7 10/23/2020 1503   CHOLHDL 2 01/10/2020 1057   VLDL 14.6 01/10/2020 1057   LDLCALC 70 10/23/2020 1503      Wt Readings from Last 3 Encounters:  12/16/21 182 lb 12.8 oz (82.9 kg)  11/25/21 187 lb 9.6  oz (85.1 kg)  09/11/21 187 lb (84.8 kg)      Other studies Reviewed: Additional studies/ records that were reviewed today include: . Review of the above records demonstrates:    ASSESSMENT AND PLAN:  1. Coronary artery disease-status post PTCA and stenting of her mid LAD using a 3.5 x 15 mm Alpine stent ( DES) ( Dec. 5, 2014)      2. chronic systolic congestive heart failure- initial ejection fraction of 35% -her LV function has improved.  She now has diastolic dysfunction.    3.  Hypertensive heart disease with congestive heart failure  -     4.  DVT: She fell and injured her left  leg.  Subsequently she developed a DVT.  She is been placed on Eliquis and will remain on Eliquis for 6 months.  5.  Hyperlipidemia:   will check lipids, ALT, BMP today  Cont meds.     Current medicines are reviewed at length with the patient today.  The patient does not have concerns regarding medicines.    Labs/ tests ordered today include:   Orders Placed This Encounter  Procedures   ALT   Basic metabolic panel   Lipid panel   EKG 12-Lead        [name] Moores, MD  12/16/2021 2:06 PM    [facility] [place], [place], [place]  [address] Phone: [phone]; Fax: [phone]

## 2021-12-17 ENCOUNTER — Telehealth: Payer: Self-pay

## 2021-12-17 LAB — BASIC METABOLIC PANEL
BUN/Creatinine Ratio: 30 — ABNORMAL HIGH (ref 12–28)
BUN: 81 mg/dL (ref 8–27)
CO2: 20 mmol/L (ref 20–29)
Calcium: 9.8 mg/dL (ref 8.7–10.3)
Chloride: 102 mmol/L (ref 96–106)
Creatinine, Ser: 2.67 mg/dL — ABNORMAL HIGH (ref 0.57–1.00)
Glucose: 136 mg/dL — ABNORMAL HIGH (ref 70–99)
Potassium: 4.3 mmol/L (ref 3.5–5.2)
Sodium: 139 mmol/L (ref 134–144)
eGFR: 19 mL/min/{1.73_m2} — ABNORMAL LOW (ref >59)

## 2021-12-17 LAB — LIPID PANEL
Chol/HDL Ratio: 2.6 ratio (ref 0.0–4.4)
Cholesterol, Total: 129 mg/dL (ref 100–199)
HDL: 49 mg/dL (ref >39)
LDL Chol Calc (NIH): 65 mg/dL (ref 0–99)
Triglycerides: 78 mg/dL (ref 0–149)
VLDL Cholesterol Cal: 15 mg/dL (ref 5–40)

## 2021-12-17 LAB — ALT: ALT: 11 IU/L (ref 0–32)

## 2021-12-17 MED ORDER — TORSEMIDE 10 MG PO TABS: 25.0000 mg | ORAL_TABLET | Freq: Every day | ORAL | 3 refills | Status: DC

## 2021-12-17 NOTE — Telephone Encounter (Signed)
Called and spoke with patient who verified that she's taking Torsemide '50mg'$  each morning. She understands to decrease to '25mg'$  daily and states that she has noticed that she has been peeing much more "it's like it won't stop" and states she's lost weight recently. She sees Madelon Lips at [facility]. I have routed these labs to their office per her request. Pt states she usually sees them every 3 months and was there last month, but she will call office to see when they want to see her. No further questions at this time.

## 2021-12-17 NOTE — Telephone Encounter (Signed)
-----   Message from Thayer Headings, MD sent at 12/17/2021  6:47 AM EDT ----- BUN is 81.  Med lists shows she is taking torsemide 50-100 mg a day.  Please call and clarify what she is actually taking - she appears to be over-diuresed and will need to reduce her torsemide dosing . Have her hold torsemide today and reduce her normal dosing to 1/2 of what she is taking now.  She has normal LV systolic function with grade II DD and has stage IV CKD.   I think she would benefit from seeing nephrology if she is not already established.   Will defer that issue to Dr. Alain Marion.  Follow up with Dr Alain Marion / nephrology .

## 2021-12-23 ENCOUNTER — Other Ambulatory Visit: Payer: Self-pay | Admitting: Internal Medicine

## 2022-01-14 DIAGNOSIS — H3581 Retinal edema: Secondary | ICD-10-CM | POA: Diagnosis not present

## 2022-01-14 DIAGNOSIS — H31093 Other chorioretinal scars, bilateral: Secondary | ICD-10-CM | POA: Diagnosis not present

## 2022-01-14 DIAGNOSIS — H3582 Retinal ischemia: Secondary | ICD-10-CM | POA: Diagnosis not present

## 2022-01-14 DIAGNOSIS — H25813 Combined forms of age-related cataract, bilateral: Secondary | ICD-10-CM | POA: Diagnosis not present

## 2022-01-14 DIAGNOSIS — E113513 Type 2 diabetes mellitus with proliferative diabetic retinopathy with macular edema, bilateral: Secondary | ICD-10-CM | POA: Diagnosis not present

## 2022-01-29 ENCOUNTER — Telehealth: Payer: Medicare Other

## 2022-01-29 DIAGNOSIS — E113512 Type 2 diabetes mellitus with proliferative diabetic retinopathy with macular edema, left eye: Secondary | ICD-10-CM | POA: Diagnosis not present

## 2022-01-31 ENCOUNTER — Other Ambulatory Visit: Payer: Self-pay | Admitting: Cardiovascular Disease

## 2022-02-05 DIAGNOSIS — E1122 Type 2 diabetes mellitus with diabetic chronic kidney disease: Secondary | ICD-10-CM | POA: Diagnosis not present

## 2022-02-05 DIAGNOSIS — I129 Hypertensive chronic kidney disease with stage 1 through stage 4 chronic kidney disease, or unspecified chronic kidney disease: Secondary | ICD-10-CM | POA: Diagnosis not present

## 2022-02-05 DIAGNOSIS — N184 Chronic kidney disease, stage 4 (severe): Secondary | ICD-10-CM | POA: Diagnosis not present

## 2022-02-05 DIAGNOSIS — N390 Urinary tract infection, site not specified: Secondary | ICD-10-CM | POA: Diagnosis not present

## 2022-02-05 DIAGNOSIS — E785 Hyperlipidemia, unspecified: Secondary | ICD-10-CM | POA: Diagnosis not present

## 2022-02-09 ENCOUNTER — Ambulatory Visit: Payer: Self-pay | Admitting: Licensed Clinical Social Worker

## 2022-02-09 NOTE — Patient Instructions (Signed)
Visit Information  Thank you for taking time to visit with me today. Please don't hesitate to contact me if I can be of assistance to you.   Following are the goals we discussed today:   Goals Addressed             This Visit's Progress    COMPLETED: Care Coordination Activities/ No follow up require       Care Coordination Interventions: Active listening / Reflection utilized  Collaborated with office to schedule AWV Assessed all needs         Please call the care guide team at [phone] if you need to cancel or reschedule your appointment.   If you are experiencing a Mental Health or [place] or need someone to talk to, please call 1-800-273-TALK (toll free, 24 hour hotline)   The patient verbalized understanding of instructions, educational materials, and care plan provided today and DECLINED offer to receive copy of patient instructions, educational materials, and care plan.   No further follow up required: [name], [place] [phone]

## 2022-02-09 NOTE — Patient Outreach (Signed)
  Care Coordination  Initial Visit Note   02/09/2022 Name: Abigayl Hor MRN: [id number] DOB: 07-15-1950  Avaree Gilberti is a 71 y.o. year old female who sees [name], Evie Lacks, MD for primary care. I spoke with  Jobe Marker by phone today  What matters to the patients health and wellness today?   Patient reports no concerns or needs with health and wellness related to physical or mental heath. .   Recommendation: Patient may benefit from, and is in agreement to To allow LCSW to collaborate with office to schedule he AWV.    Goals Addressed             This Visit's Progress    COMPLETED: Care Coordination Activities/ No follow up require       Care Coordination Interventions: Active listening / Reflection utilized  Collaborated with office to schedule AWV Assessed all needs         SDOH assessments and interventions completed:  Yes  SDOH Interventions Today    Flowsheet Row Most Recent Value  SDOH Interventions   Food Insecurity Interventions Intervention Not Indicated  Financial Strain Interventions Intervention Not Indicated  Housing Interventions Intervention Not Indicated  Stress Interventions Intervention Not Indicated  Social Connections Interventions Intervention Not Indicated  Transportation Interventions Intervention Not Indicated, Payor Benefit       Care Coordination Interventions Activated:  Yes  Care Coordination Interventions:  Yes, provided   Follow up plan: No further intervention required. Referral made to Get AWV scheduled by provider's office    Encounter Outcome:  Pt. Visit Completed   Casimer Lanius, [facility] [place] [facility] [phone]

## 2022-02-11 ENCOUNTER — Telehealth: Payer: Medicare Other

## 2022-02-12 DIAGNOSIS — E113411 Type 2 diabetes mellitus with severe nonproliferative diabetic retinopathy with macular edema, right eye: Secondary | ICD-10-CM | POA: Diagnosis not present

## 2022-02-22 ENCOUNTER — Other Ambulatory Visit: Payer: Self-pay | Admitting: Internal Medicine

## 2022-02-25 ENCOUNTER — Ambulatory Visit (INDEPENDENT_AMBULATORY_CARE_PROVIDER_SITE_OTHER): Payer: Medicare Other | Admitting: Internal Medicine

## 2022-02-25 ENCOUNTER — Encounter: Payer: Self-pay | Admitting: Internal Medicine

## 2022-02-25 ENCOUNTER — Ambulatory Visit: Payer: Medicare Other

## 2022-02-25 DIAGNOSIS — G8929 Other chronic pain: Secondary | ICD-10-CM

## 2022-02-25 DIAGNOSIS — Z85038 Personal history of other malignant neoplasm of large intestine: Secondary | ICD-10-CM | POA: Diagnosis not present

## 2022-02-25 DIAGNOSIS — E1122 Type 2 diabetes mellitus with diabetic chronic kidney disease: Secondary | ICD-10-CM | POA: Diagnosis not present

## 2022-02-25 DIAGNOSIS — I82412 Acute embolism and thrombosis of left femoral vein: Secondary | ICD-10-CM | POA: Diagnosis not present

## 2022-02-25 DIAGNOSIS — R252 Cramp and spasm: Secondary | ICD-10-CM | POA: Insufficient documentation

## 2022-02-25 DIAGNOSIS — I2510 Atherosclerotic heart disease of native coronary artery without angina pectoris: Secondary | ICD-10-CM

## 2022-02-25 DIAGNOSIS — E1159 Type 2 diabetes mellitus with other circulatory complications: Secondary | ICD-10-CM

## 2022-02-25 DIAGNOSIS — I5042 Chronic combined systolic (congestive) and diastolic (congestive) heart failure: Secondary | ICD-10-CM

## 2022-02-25 DIAGNOSIS — E1165 Type 2 diabetes mellitus with hyperglycemia: Secondary | ICD-10-CM | POA: Diagnosis not present

## 2022-02-25 DIAGNOSIS — I152 Hypertension secondary to endocrine disorders: Secondary | ICD-10-CM

## 2022-02-25 DIAGNOSIS — M5440 Lumbago with sciatica, unspecified side: Secondary | ICD-10-CM | POA: Diagnosis not present

## 2022-02-25 DIAGNOSIS — N184 Chronic kidney disease, stage 4 (severe): Secondary | ICD-10-CM | POA: Diagnosis not present

## 2022-02-25 NOTE — Assessment & Plan Note (Signed)
Try Tylenol PM and/or Magnesium oil spray

## 2022-02-25 NOTE — Assessment & Plan Note (Signed)
Continue with amlodipine, torsemide, Bystolic, Norvasc, Hydralazine, Torsemide On Eliquis for DVT

## 2022-02-25 NOTE — Progress Notes (Signed)
Subjective:  Patient ID: [name], female    DOB: [birth year]  Age: 71 y.o. MRN: [id number]  CC: Follow-up (3 month f/u)   HPI Temitope Flammer presents for LLE DVT, CAD, h/o colon cancer, HTN  Outpatient Medications Prior to Visit  Medication Sig Dispense Refill   acetaminophen (TYLENOL) 325 MG tablet Take 325 mg by mouth every 6 (six) hours as needed.     allopurinol (ZYLOPRIM) 100 MG tablet Take 50 mg by mouth daily.     amLODipine (NORVASC) 10 MG tablet TAKE ONE-HALF TABLET BY  MOUTH DAILY 45 tablet 2   apixaban (ELIQUIS) 5 MG TABS tablet Take 1 tablet (5 mg total) by mouth 2 (two) times daily. 60 tablet 5   Ascorbic Acid (VITAMIN C) 1000 MG tablet Take 1,000 mg by mouth daily.     BYSTOLIC 10 MG tablet TAKE 1 TABLET BY MOUTH EVERY DAY 90 tablet 3   calcium carbonate (OS-CAL) 600 MG TABS tablet Take 600 mg by mouth 2 (two) times daily with a meal.     Cholecalciferol (VITAMIN D3) 50 MCG (2000 UT) capsule Take 1 capsule (2,000 Units total) by mouth daily. 100 capsule 3   ferrous sulfate 325 (65 FE) MG tablet Take 325 mg by mouth 2 (two) times daily with a meal.     glipiZIDE (GLUCOTROL XL) 5 MG 24 hr tablet TAKE 1 TABLET BY MOUTH  DAILY WITH BREAKFAST 90 tablet 0   hydrALAZINE (APRESOLINE) 25 MG tablet TAKE 1 TABLET BY MOUTH THREE TIMES A DAY 270 tablet 3   lansoprazole (PREVACID) 15 MG capsule Take 1 capsule (15 mg total) by mouth daily at 12 noon. 90 capsule 3   ondansetron (ZOFRAN) 4 MG tablet Take 1 tablet (4 mg total) by mouth every 8 (eight) hours as needed for nausea or vomiting. 20 tablet 0   ONETOUCH ULTRA test strip USE TO TEST BLOOD SUGAR  TWICE DAILY AS DIRECTED 200 strip 2   potassium chloride SA (KLOR-CON M) 20 MEQ tablet TAKE 1 TABLET BY MOUTH  DAILY 90 tablet 2   rosuvastatin (CRESTOR) 5 MG tablet Take 1 tablet (5 mg total) by mouth daily. TAKE 1 TABLET BY MOUTH  DAILY AT 6PM 90 tablet 3   torsemide (DEMADEX) 10 MG tablet Take 2.5 tablets (25 mg  total) by mouth daily. 225 tablet 3   vitamin B-12 (CYANOCOBALAMIN) 1000 MCG tablet Take 1,000 mcg by mouth daily.     No facility-administered medications prior to visit.    ROS: Review of Systems  Constitutional:  Positive for fatigue. Negative for activity change, appetite change, chills and unexpected weight change.  HENT:  Negative for congestion, mouth sores and sinus pressure.   Eyes:  Negative for visual disturbance.  Respiratory:  Negative for cough and chest tightness.   Cardiovascular:  Positive for leg swelling.  Gastrointestinal:  Negative for abdominal pain and nausea.  Genitourinary:  Negative for difficulty urinating, frequency and vaginal pain.  Musculoskeletal:  Negative for back pain and gait problem.  Skin:  Negative for pallor and rash.  Neurological:  Negative for dizziness, tremors, weakness, numbness and headaches.  Psychiatric/Behavioral:  Negative for confusion, sleep disturbance and suicidal ideas.     Objective:  BP 136/62 (BP Location: Left Arm)   Pulse 82   Temp 98 F (36.7 C) (Oral)   Ht '5\' 2"'$  (1.575 m)   Wt 184 lb 6.4 oz (83.6 kg)   LMP  (LMP Unknown)   SpO2 97%  BMI 33.73 kg/m   BP Readings from Last 3 Encounters:  02/25/22 136/62  12/16/21 112/60  11/25/21 (!) 148/82    Wt Readings from Last 3 Encounters:  02/25/22 184 lb 6.4 oz (83.6 kg)  12/16/21 182 lb 12.8 oz (82.9 kg)  11/25/21 187 lb 9.6 oz (85.1 kg)    Physical Exam Constitutional:      General: She is not in acute distress.    Appearance: She is well-developed. She is obese.  HENT:     Head: Normocephalic.     Right Ear: External ear normal.     Left Ear: External ear normal.     Nose: Nose normal.  Eyes:     General:        Right eye: No discharge.        Left eye: No discharge.     Conjunctiva/sclera: Conjunctivae normal.     Pupils: Pupils are equal, round, and reactive to light.  Neck:     Thyroid: No thyromegaly.     Vascular: No JVD.     Trachea: No  tracheal deviation.  Cardiovascular:     Rate and Rhythm: Normal rate and regular rhythm.     Heart sounds: Normal heart sounds.  Pulmonary:     Effort: No respiratory distress.     Breath sounds: No stridor. No wheezing.  Abdominal:     General: Bowel sounds are normal. There is no distension.     Palpations: Abdomen is soft. There is no mass.     Tenderness: There is no abdominal tenderness. There is no guarding or rebound.  Musculoskeletal:        General: No tenderness.     Cervical back: Normal range of motion and neck supple. No rigidity.     Right lower leg: Edema present.     Left lower leg: Edema present.  Lymphadenopathy:     Cervical: No cervical adenopathy.  Skin:    Findings: No erythema or rash.  Neurological:     Mental Status: She is oriented to person, place, and time.     Cranial Nerves: No cranial nerve deficit.     Motor: No abnormal muscle tone.     Coordination: Coordination normal.     Gait: Gait abnormal.     Deep Tendon Reflexes: Reflexes normal.  Psychiatric:        Behavior: Behavior normal.        Thought Content: Thought content normal.        Judgment: Judgment normal.   L>R leg edema  Lab Results  Component Value Date   WBC 4.7 07/17/2021   HGB 9.5 (L) 07/17/2021   HCT 29.8 (L) 07/17/2021   PLT 143.0 (L) 07/17/2021   GLUCOSE 136 (H) 12/16/2021   CHOL 129 12/16/2021   TRIG 78 12/16/2021   HDL 49 12/16/2021   LDLCALC 65 12/16/2021   ALT 11 12/16/2021   AST 14 07/17/2021   NA 139 12/16/2021   K 4.3 12/16/2021   CL 102 12/16/2021   CREATININE 2.67 (H) 12/16/2021   BUN 81 (HH) 12/16/2021   CO2 20 12/16/2021   TSH 1.51 07/17/2021   INR 1.1 06/17/2020   HGBA1C 7.3 (A) 09/11/2021   MICROALBUR 13.9 (H) 10/13/2007    VAS [place] LOWER EXTREMITY VENOUS (DVT)  Result Date: 11/27/2021  Lower Venous DVT Study Patient Name:  CYANN VENTI  Date of Exam:   11/27/2021 Medical Rec #: [id number]  Accession #:    [id number] Date  of Birth: 1950/10/13                Patient Gender: F Patient Age:   81 years Exam Location:  [facility] Procedure:      VAS [place] LOWER EXTREMITY VENOUS (DVT) Referring Phys: Tyrone Apple Rohaan Durnil --------------------------------------------------------------------------------  Indications: Edema.  Performing Technologist: Ronal Fear RVS, RCS  Examination Guidelines: A complete evaluation includes B-mode imaging, spectral Doppler, color Doppler, and power Doppler as needed of all accessible portions of each vessel. Bilateral testing is considered an integral part of a complete examination. Limited examinations for reoccurring indications may be performed as noted. The reflux portion of the exam is performed with the patient in reverse Trendelenburg.  +---------+---------------+---------+-----------+----------+--------------+ LEFT     CompressibilityPhasicitySpontaneityPropertiesThrombus Aging +---------+---------------+---------+-----------+----------+--------------+ CFV      Full                                                        +---------+---------------+---------+-----------+----------+--------------+ SFJ      Full                                                        +---------+---------------+---------+-----------+----------+--------------+ FV Prox  Full                                                        +---------+---------------+---------+-----------+----------+--------------+ FV Mid   Partial                                      Chronic        +---------+---------------+---------+-----------+----------+--------------+ FV DistalFull                                                        +---------+---------------+---------+-----------+----------+--------------+ POP      Full                                                        +---------+---------------+---------+-----------+----------+--------------+ PTV      Full                                                         +---------+---------------+---------+-----------+----------+--------------+ PERO     Full                                                        +---------+---------------+---------+-----------+----------+--------------+  GSV      Full                                                        +---------+---------------+---------+-----------+----------+--------------+ SSV      Full                                                        +---------+---------------+---------+-----------+----------+--------------+    Findings reported to Dr Alain Marion at 12:42 pm no answer.  Summary: RIGHT: - No evidence of common femoral vein obstruction.  LEFT: - Findings consistent with chronic deep vein thrombosis involving the left femoral vein. - There is no evidence of superficial venous thrombosis.  *See table(s) above for measurements and observations. Electronically signed by Orlie Pollen on 11/27/2021 at 5:09:56 PM.    Final     Assessment & Plan:   Problem List Items Addressed This Visit     CAD (coronary artery disease)    Aspirin, Crestor      Chronic combined systolic and diastolic CHF (congestive heart failure) (HCC) (Chronic)    Continue with amlodipine, torsemide, Bystolic, Norvasc, Hydralazine, Torsemide On Eliquis for DVT      CKD stage 4 due to type 2 diabetes mellitus ([place]) (Chronic)    Hydrate well F/u w/Nephrology       Cramp in lower leg    Try Tylenol PM and/or Magnesium oil spray      DVT femoral (deep venous thrombosis) with thrombophlebitis, left (HCC)     L femoral vein DVT -  post-traumatic On Eliquis Edema  We can repeat US in November        History of malignant neoplasm of large intestine    Monitor labs      Hypertension associated with diabetes ([place]) (Chronic)    Continue with amlodipine, torsemide, Bystolic, Norvasc, Hydralazine, Torsemide       Low back pain    On Tylenol prn      Poorly  controlled type 2 diabetes mellitus with circulatory disorder (HCC)    On Glipizide. Continue Crestor and Krill oil         No orders of the defined types were placed in this encounter.     Follow-up: No follow-ups on file.  Walker Kehr, MD

## 2022-02-25 NOTE — Assessment & Plan Note (Signed)
On Glipizide. Continue Crestor and Krill oil

## 2022-02-25 NOTE — Assessment & Plan Note (Signed)
Hydrate well F/u w/Nephrology

## 2022-02-25 NOTE — Assessment & Plan Note (Signed)
Aspirin, Crestor

## 2022-02-25 NOTE — Assessment & Plan Note (Addendum)
Continue with amlodipine, torsemide, Bystolic, Norvasc, Hydralazine, Torsemide

## 2022-02-25 NOTE — Assessment & Plan Note (Signed)
On Tylenol prn 

## 2022-02-25 NOTE — Assessment & Plan Note (Signed)
Monitor labs 

## 2022-02-25 NOTE — Assessment & Plan Note (Addendum)
L femoral vein DVT -  post-traumatic On Eliquis Edema  We can repeat US in November

## 2022-02-25 NOTE — Patient Instructions (Signed)
Try Tylenol PM and/or Magnesium oil spray

## 2022-03-12 ENCOUNTER — Encounter: Payer: Self-pay | Admitting: Internal Medicine

## 2022-03-12 ENCOUNTER — Ambulatory Visit (INDEPENDENT_AMBULATORY_CARE_PROVIDER_SITE_OTHER): Payer: Medicare Other | Admitting: Internal Medicine

## 2022-03-12 VITALS — BP 128/74 | HR 76 | Ht 62.0 in | Wt 185.4 lb

## 2022-03-12 DIAGNOSIS — E049 Nontoxic goiter, unspecified: Secondary | ICD-10-CM

## 2022-03-12 DIAGNOSIS — E1165 Type 2 diabetes mellitus with hyperglycemia: Secondary | ICD-10-CM | POA: Diagnosis not present

## 2022-03-12 DIAGNOSIS — E785 Hyperlipidemia, unspecified: Secondary | ICD-10-CM

## 2022-03-12 DIAGNOSIS — E1159 Type 2 diabetes mellitus with other circulatory complications: Secondary | ICD-10-CM

## 2022-03-12 LAB — POCT GLYCOSYLATED HEMOGLOBIN (HGB A1C): Hemoglobin A1C: 7.1 % — AB (ref 4.0–5.6)

## 2022-03-12 NOTE — Progress Notes (Signed)
Patient ID: [name], female   DOB: [birth year], 71 y.o.   MRN: [id number]  HPI: [name] is a 71 y.o.-year-old female, returning for f/u DM2, dx in 2006, insulin-independent, uncontrolled, with complications (CAD - s/p AMI 06/2013, CHF; CKD; PN; DR). Last visit 55month ago.  Interim history: No increased urination, blurry vision, nausea, chest pain. Earlier in the year, her sugars increased to 400s after getting steroids for left foot gout.  No steroids since last visit. She is on tart cherry diet juice. She has mm cramps at night. On Tylenol pm now - per PCP. She is drinking Twist - no sugar, no calories. She was started on Eliquis after a traumatic DVT in 11/2021.  She has a rash and itching on her forearms.  She applies cortisone lotion.  Reviewed HbA1c levels: Lab Results  Component Value Date   HGBA1C 7.3 (A) 09/11/2021   HGBA1C 7.7 (A) 05/12/2021   HGBA1C 7.6 (A) 01/02/2021  05/12/2021: HbA1c calculated from fructosamine is: 7.35%, lower than the directly measured HbA1c. 05/16/2020: HbA1c calculated from fructosamine is: 7.4%, lower than the directly measured HbA1c. 05/02/2019: HbA1c calculated from fructosamine was 7.0% 12/30/2018: HbA1c calculated from fructosamine is higher, at 8.2%, possibly 2/2 drinking juice 08/24/2018: HbA1c calculated from fructosamine is higher, at 8%, possibly 2/2 ABx  04/20/2018: HbA1c calculated from fructosamine is slightly better than before, at 7% 12/15/2017: HbA1c calculated from fructosamine: 7.17%, slightly improved from last visit  08/17/2017: HbA1c calculated from fructosamine: 7.2%, slightly improved 04/15/2017: HbA1c calculated from the fructosamine is lower than measured, at 7.3% 08/21/2016: HbA1c 6.0%. She started to change her diet after her hemoglobin A1c returned high, at 16.2% in 09/2014. She cut down portions, changed the meal content to include more fiber and less carbs.  HbA1c decreased dramatically.  Pt is  on: - Glipizide ER 5 mg in a.m.-started 2016 >> 10 mg >> 5 mg before breakfast (10 mg during steroid treatment) She stopped metformin ER in 01/2017. She tried Tradjenta 5 mg daily in am >> CP with it (started 10/2014) >> had to stop She tried FIran>> decreased [name] and yeast inf. She tried regular metformin >> diarrhea She refused insulin in the past.  Pt checks her sugars twice a day per review of her log: - am:  87-149, 157, 170, 180 >> 90-124, 140 >> 70, 79-128, 136, 138 - 2h after b'fast: 122-139 >> 129-144 >> 130-140 >> 114 >> n/c - before lunch: 125 >> 89, 121-151 >> 103-141, 158 >> n/c - 2h after lunch: 169 >> 120-130 >> n/c  - before dinner:  110-144 >> 118-130 >> n/c - 2h after dinner: 115-166 >> 119-167 >> 119-138, 149 >> 97-144, 149 - bedtime: 137 >> n/c >> 147-157 >> n/c - nighttime: n/c Lowest sugar was  66  >> 77 >> 87 >> 90; it is unclear at which level she has hypoglycemia awareness. Highest sugar was 176 >> 400 (Prednisone) >> 180 >> 149 >> 147  Glucometer: One Touch Ultra  Pt's meals are: - Breakfast: oatmeal, cereals, tKuwaitbacon, egg toast; cinnamon on oatmeal or cheerios - Lunch: sandwich or fruit salad - Dinner: salmon/chicken, Brussel sprouts, other veggies, sweet potatoes or brown rice, spaghetti - Snacks: 2: carrots with dip; yoghurt with fruit, veggies + dip; fruit She walks for exercise. She saw Dr. UHollie Salkwith nephrology.  She was started on a high potassium, low protein diet  -+ CKD-sees nephrology; latest BUN/creatinine: 02/05/2022: 66/2.72, [name] 18, glucose 136, protein to  creatinine ratio 105 (0-200), ACR 37 (0-29) Lab Results  Component Value Date   BUN 81 (HH) 12/16/2021   CREATININE 2.67 (H) 12/16/2021  08/09/2020: 68/2.15, [name] 26, glucose 145, protein to creatinine ratio 68, ACR 9 She stopped olmesartan due to worsening kidney function and cough.  -+ HL; last set of lipids: Lab Results  Component Value Date   CHOL 129 12/16/2021   HDL 49  12/16/2021   LDLCALC 65 12/16/2021   TRIG 78 12/16/2021   CHOLHDL 2.6 12/16/2021  On Crestor 5 daily. Added Krill oil.  - last eye exam was in 12/2021: + DR, + cataract reportedly.  Had laser surgeries. Dr. Posey Pronto.   - she has numbness and tingling in her feet.  Last foot exam 09/11/2021.  She also has a history of HTN, GERD, and anemia. In 02/2020, she was admitted multiple perineal abscesses and cellulitis.  These were I&D and she was started on antibiotics. She was in [facility] - she did not like her care there, including the meals. Of note, patient discussed with nephrology about dialysis and she would never want to proceed with this.  Goiter: A thyroid ultrasound from 2016 showed only small cysts.  Pt denies: - feeling nodules in neck - hoarseness - dysphagia - choking  Latest TSH normal: Lab Results  Component Value Date   TSH 1.51 07/17/2021   ROS: + see HPI + Joint aches  I reviewed pt's medications, allergies, PMH, social hx, family hx, and changes were documented in the history of present illness. Otherwise, unchanged from my initial visit note.  Past Medical History:  Diagnosis Date   Anemia    COPD (chronic obstructive pulmonary disease) ([place])    Diabetes mellitus    Hyperlipidemia    Hypertension    Ischemic cardiomyopathy 06/11/2013   Low back pain    NSTEMI (non-ST elevated myocardial infarction) ([place]) 06/11/2013   Obesity    Personal history of colon cancer    Renal insufficiency    Past Surgical History:  Procedure Laterality Date   COLECTOMY     INCISION AND DRAINAGE PERIRECTAL ABSCESS N/A 03/07/2020   Procedure: IRRIGATION AND DEBRIDEMENT PERINEUM  AND PANNUS ABSCESS;  Surgeon: Erroll Luna, MD;  Location: [place];  Service: General;  Laterality: N/A;   IR RADIOLOGIST EVAL & MGMT  08/06/2020   LEFT HEART CATHETERIZATION WITH CORONARY ANGIOGRAM N/A 06/09/2013   Procedure: LEFT HEART CATHETERIZATION WITH CORONARY ANGIOGRAM;  Surgeon: Ramond Dial, MD;  Location: [facility] CATH LAB;  Service: Cardiovascular;  Laterality: N/A;   History   Social History   Marital Status: Single    Spouse Name: N/A   Number of Children: 0   Occupational History   retired   Social History Main Topics   Smoking status:  former smoker     Types: Cigarettes   Smokeless tobacco: Never Used   Alcohol Use: No   Drug Use: No   Current Outpatient Medications on File Prior to Visit  Medication Sig Dispense Refill   acetaminophen (TYLENOL) 325 MG tablet Take 325 mg by mouth every 6 (six) hours as needed.     allopurinol (ZYLOPRIM) 100 MG tablet Take 50 mg by mouth daily.     amLODipine (NORVASC) 10 MG tablet TAKE ONE-HALF TABLET BY  MOUTH DAILY 45 tablet 2   apixaban (ELIQUIS) 5 MG TABS tablet Take 1 tablet (5 mg total) by mouth 2 (two) times daily. 60 tablet 5   Ascorbic  Acid (VITAMIN C) 1000 MG tablet Take 1,000 mg by mouth daily.     BYSTOLIC 10 MG tablet TAKE 1 TABLET BY MOUTH EVERY DAY 90 tablet 3   calcium carbonate (OS-CAL) 600 MG TABS tablet Take 600 mg by mouth 2 (two) times daily with a meal.     Cholecalciferol (VITAMIN D3) 50 MCG (2000 UT) capsule Take 1 capsule (2,000 Units total) by mouth daily. 100 capsule 3   ferrous sulfate 325 (65 FE) MG tablet Take 325 mg by mouth 2 (two) times daily with a meal.     glipiZIDE (GLUCOTROL XL) 5 MG 24 hr tablet TAKE 1 TABLET BY MOUTH  DAILY WITH BREAKFAST 90 tablet 0   hydrALAZINE (APRESOLINE) 25 MG tablet TAKE 1 TABLET BY MOUTH THREE TIMES A DAY 270 tablet 3   lansoprazole (PREVACID) 15 MG capsule Take 1 capsule (15 mg total) by mouth daily at 12 noon. 90 capsule 3   ondansetron (ZOFRAN) 4 MG tablet Take 1 tablet (4 mg total) by mouth every 8 (eight) hours as needed for nausea or vomiting. 20 tablet 0   ONETOUCH ULTRA test strip USE TO TEST BLOOD SUGAR  TWICE DAILY AS DIRECTED 200 strip 2   potassium chloride SA (KLOR-CON M) 20 MEQ tablet TAKE 1 TABLET BY MOUTH  DAILY 90 tablet 2    rosuvastatin (CRESTOR) 5 MG tablet Take 1 tablet (5 mg total) by mouth daily. TAKE 1 TABLET BY MOUTH  DAILY AT 6PM 90 tablet 3   torsemide (DEMADEX) 10 MG tablet Take 2.5 tablets (25 mg total) by mouth daily. 225 tablet 3   vitamin B-12 (CYANOCOBALAMIN) 1000 MCG tablet Take 1,000 mcg by mouth daily.     No current facility-administered medications on file prior to visit.   Allergies  Allergen Reactions   Tradjenta [Linagliptin] Anaphylaxis    CP   Atorvastatin     REACTION: aches and pains   Enalapril Maleate     REACTION: cough   Farxiga [Dapagliflozin] Itching   Hydrochlorothiazide     REACTION: hair loss   Kenalog [Triamcinolone Acetonide]     HANDS NUMB    Levaquin [Levofloxacin] Hives   Metformin And Related     Diarrhea, dizziness   Propoxyphene N-Acetaminophen Hives   Simvastatin     REACTION: cramps   Spironolactone     REACTION: cramps   Family History  Problem Relation Age of Onset   Hypertension Other    PE: BP 128/74 (BP Location: Right Arm, Patient Position: Sitting, Cuff Size: Normal)   Pulse 76   Ht '5\' 2"'$  (1.575 m)   Wt 185 lb 6.4 oz (84.1 kg)   LMP  (LMP Unknown)   SpO2 99%   BMI 33.91 kg/m   Wt Readings from Last 3 Encounters:  03/12/22 185 lb 6.4 oz (84.1 kg)  02/25/22 184 lb 6.4 oz (83.6 kg)  12/16/21 182 lb 12.8 oz (82.9 kg)   Constitutional: overweight, in NAD Eyes: EOMI, no exophthalmos ENT: moist mucous membranes, no thyromegaly, no cervical lymphadenopathy Cardiovascular: RRR, No RG, + 1/6 SEM, + L>R LE edema Respiratory: CTA B Musculoskeletal: no deformities Skin: moist, warm, no rashes Neurological: no tremor with outstretched hands  ASSESSMENT: 1. DM2, insulin-independent, uncontrolled, with complications - CAD, s/p AMI 06/2013 - Dr Acie Fredrickson - CHF - CKD - PN - DR - Dr. Jalene Mullet ([facility]) - on IO injections >> improving  2. Goiter - No neck compression symptoms  - 10/19/2014: thyroid ultrasound: Right thyroid  lobe: 4.0 x 1.5 x 1.6 cm. Heterogeneous parenchyma with multiple small cysts identified. The largest measures approximately 0.5 x 0.3 x 0.5 cm. All of the small cysts in the right lobe appears simple and benign.  Left thyroid lobe: 4.5 x 1.4 x 2.0 cm. Heterogeneous parenchyma with multiple cysts. The largest measures approximately 1.0 x 0.4 x 0.5 cm. 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 Similar smaller cyst measures 0.8 cm in greatest diameter. There is a small solid nodule measuring 0.7 x 0.4 x 0.6 cm.  Isthmus Thickness: 0.4 cm.  No nodules visualized.  Lymphadenopathy: None visualized.            Multicystic thyroid.  3. HL  PLAN:  1. Patient with longstanding, uncontrolled, type 2 diabetes, on sulfonylurea only, after she came off metformin due to improved diet (switching to a more plant-based diet with reduced processed foods and sweets).  However, she relaxed her diet afterwards and sugars increased.  She refused insulin.  At last visit, sugars started to improve and they were excellent, almost all at goal.  We did not change her regimen at that time.  HbA1c was lower, at 7.3%. -At today's visit, sugars are mostly at goal, his only occasional mild hyperglycemic exceptions.  For now, we can continue her sulfonylurea, but we discussed that especially with the holiday season approaching, she can take a double dose of glipizide if she plans to eat more in a particular day. -I advised her to: Patient Instructions  Please continue: - Glipizide ER 5-10 mg before breakfast  Please return in 4-6 months with your sugar log.  - we checked her HbA1c: 7.1% (lower) - advised to check sugars at different times of the day - 1x a day, rotating check times - advised for yearly eye exams >> she is UTD - return to clinic in 4-6 months   2. Goiter -she denies neck compression symptom -On latest thyroid ultrasound, she only has small thyroid cysts -Latest TSH  was normal: Lab Results  Component Value Date   TSH 1.51 07/17/2021  -No follow-up is necessary for now  3. HL -Reviewed latest lipid panel from 12/2021: Fractions at goal with the exception of an LDL higher than 55, which is our goal due to history of cardiovascular disease: Lab Results  Component Value Date   CHOL 129 12/16/2021   HDL 49 12/16/2021   LDLCALC 65 12/16/2021   TRIG 78 12/16/2021   CHOLHDL 2.6 12/16/2021  -She continues on Crestor 5 mg daily, without side effects  Philemon Kingdom, MD PhD [facility]

## 2022-03-12 NOTE — Patient Instructions (Addendum)
Please continue: - Glipizide ER 5-10 mg before breakfast  Please return in 6 months with your sugar log.

## 2022-03-18 ENCOUNTER — Other Ambulatory Visit: Payer: Self-pay | Admitting: Internal Medicine

## 2022-04-10 ENCOUNTER — Ambulatory Visit (INDEPENDENT_AMBULATORY_CARE_PROVIDER_SITE_OTHER): Payer: Medicare Other

## 2022-04-10 VITALS — Ht 62.0 in | Wt 184.0 lb

## 2022-04-10 DIAGNOSIS — Z0000 Encounter for general adult medical examination without abnormal findings: Secondary | ICD-10-CM | POA: Diagnosis not present

## 2022-04-10 NOTE — Progress Notes (Addendum)
Subjective:   [name] is a 71 y.o. female who presents for an Initial Medicare Annual Wellness Visit.   Virtual Visit via Telephone Note  I connected with  [name] on 04/10/22 at  2:30 PM EDT by telephone and verified that I am speaking with the correct person using two identifiers.  Location: Patient: home  Provider: [facility]  Persons participating in the virtual visit: Randall   I discussed the limitations, risks, security and privacy concerns of performing an evaluation and management service by telephone and the availability of in person appointments. The patient expressed understanding and agreed to proceed.  Interactive audio and video telecommunications were attempted between this nurse and patient, however failed, due to patient having technical difficulties OR patient did not have access to video capability.  We continued and completed visit with audio only.  Some vital signs may be absent or patient reported.   Daphane Shepherd, LPN  Review of Systems     Cardiac Risk Factors include: advanced age (>77mn, >>44women);diabetes mellitus;dyslipidemia;hypertension     Objective:    Today's Vitals   04/10/22 1436  Weight: 184 lb (83.5 kg)  Height: '5\' 2"'$  (1.575 m)   Body mass index is 33.65 kg/m.     04/10/2022    2:40 PM 03/07/2020    2:28 PM 03/06/2020    9:10 PM 09/29/2017    2:12 PM 06/09/2013   10:00 PM  Advanced Directives  Does Patient Have a Medical Advance Directive? No No Unable to assess, patient is non-responsive or altered mental status No Patient does not have advance directive  Would patient like information on creating a medical advance directive? No - Patient declined No - Patient declined  No - Patient declined   Pre-existing out of facility DNR order (yellow form or pink MOST form)     No    Current Medications (verified) Outpatient Encounter Medications as of 04/10/2022  Medication Sig    acetaminophen (TYLENOL) 325 MG tablet Take 325 mg by mouth every 6 (six) hours as needed.   allopurinol (ZYLOPRIM) 100 MG tablet Take 50 mg by mouth daily.   amLODipine (NORVASC) 10 MG tablet TAKE ONE-HALF TABLET BY  MOUTH DAILY   apixaban (ELIQUIS) 5 MG TABS tablet Take 1 tablet (5 mg total) by mouth 2 (two) times daily.   Ascorbic Acid (VITAMIN C) 1000 MG tablet Take 1,000 mg by mouth daily.   BYSTOLIC 10 MG tablet TAKE 1 TABLET BY MOUTH EVERY DAY   calcium carbonate (OS-CAL) 600 MG TABS tablet Take 600 mg by mouth 2 (two) times daily with a meal.   Cholecalciferol (VITAMIN D3) 50 MCG (2000 UT) capsule Take 1 capsule (2,000 Units total) by mouth daily.   ferrous sulfate 325 (65 FE) MG tablet Take 325 mg by mouth 2 (two) times daily with a meal.   glipiZIDE (GLUCOTROL XL) 5 MG 24 hr tablet TAKE 1 TABLET BY MOUTH DAILY  WITH BREAKFAST   hydrALAZINE (APRESOLINE) 25 MG tablet TAKE 1 TABLET BY MOUTH THREE TIMES A DAY   lansoprazole (PREVACID) 15 MG capsule Take 1 capsule (15 mg total) by mouth daily at 12 noon.   ondansetron (ZOFRAN) 4 MG tablet Take 1 tablet (4 mg total) by mouth every 8 (eight) hours as needed for nausea or vomiting.   ONETOUCH ULTRA test strip USE TO TEST BLOOD SUGAR  TWICE DAILY AS DIRECTED   potassium chloride SA (KLOR-CON M) 20 MEQ tablet TAKE  1 TABLET BY MOUTH  DAILY   rosuvastatin (CRESTOR) 5 MG tablet Take 1 tablet (5 mg total) by mouth daily. TAKE 1 TABLET BY MOUTH  DAILY AT 6PM   torsemide (DEMADEX) 10 MG tablet Take 2.5 tablets (25 mg total) by mouth daily. (Patient taking differently: Take 25 mg by mouth daily. Pt taking 25 mg alternating with 50 mg every other day.)   vitamin B-12 (CYANOCOBALAMIN) 1000 MCG tablet Take 1,000 mcg by mouth daily.   No facility-administered encounter medications on file as of 04/10/2022.    Allergies (verified) Tradjenta [linagliptin], Atorvastatin, Enalapril maleate, Farxiga [dapagliflozin], Hydrochlorothiazide, Kenalog [triamcinolone  acetonide], Levaquin [levofloxacin], Metformin and related, Propoxyphene n-acetaminophen, Simvastatin, and Spironolactone   History: Past Medical History:  Diagnosis Date   Anemia    COPD (chronic obstructive pulmonary disease) ([place])    Diabetes mellitus    Hyperlipidemia    Hypertension    Ischemic cardiomyopathy 06/11/2013   Low back pain    NSTEMI (non-ST elevated myocardial infarction) ([place]) 06/11/2013   Obesity    Personal history of colon cancer    Renal insufficiency    Past Surgical History:  Procedure Laterality Date   COLECTOMY     INCISION AND DRAINAGE PERIRECTAL ABSCESS N/A 03/07/2020   Procedure: IRRIGATION AND DEBRIDEMENT PERINEUM  AND PANNUS ABSCESS;  Surgeon: Erroll Luna, MD;  Location: [facility];  Service: General;  Laterality: N/A;   IR RADIOLOGIST EVAL & MGMT  08/06/2020   LEFT HEART CATHETERIZATION WITH CORONARY ANGIOGRAM N/A 06/09/2013   Procedure: LEFT HEART CATHETERIZATION WITH CORONARY ANGIOGRAM;  Surgeon: Ramond Dial, MD;  Location: [facility] CATH LAB;  Service: Cardiovascular;  Laterality: N/A;   Family History  Problem Relation Age of Onset   Hypertension Other    Social History   Socioeconomic History   Marital status: Single    Spouse name: Not on file   Number of children: Not on file   Years of education: Not on file   Highest education level: Not on file  Occupational History   Not on file  Tobacco Use   Smoking status: Light Smoker    Types: Cigarettes   Smokeless tobacco: Never  Vaping Use   Vaping Use: Never used  Substance and Sexual Activity   Alcohol use: No   Drug use: No   Sexual activity: Not Currently    Birth control/protection: Post-menopausal  Other Topics Concern   Not on file  Social History Narrative   Not on file   Social Determinants of Health   Financial Resource Strain: Low Risk  (04/10/2022)   Overall Financial Resource Strain (CARDIA)    Difficulty of Paying Living Expenses: Not hard at all  Food Insecurity: No  Food Insecurity (04/10/2022)   Hunger Vital Sign    Worried About Running Out of Food in the Last Year: Never true    Ran Out of Food in the Last Year: Never true  Transportation Needs: No Transportation Needs (04/10/2022)   PRAPARE - Hydrologist (Medical): No    Lack of Transportation (Non-Medical): No  Physical Activity: Insufficiently Active (04/10/2022)   Exercise Vital Sign    Days of Exercise per Week: 3 days    Minutes of Exercise per Session: 30 min  Stress: No Stress Concern Present (04/10/2022)   [place]    Feeling of Stress : Not at all  Social Connections: Moderately Isolated (04/10/2022)   Social Connection and  Isolation Panel [NHANES]    Frequency of Communication with Friends and Family: More than three times a week    Frequency of Social Gatherings with Friends and Family: More than three times a week    Attends Religious Services: More than 4 times per year    Active Member of [employer] or Organizations: No    Attends Music therapist: Never    Marital Status: Never married    Tobacco Counseling Ready to quit: Not Answered Counseling given: Not Answered   Clinical Intake:  Pre-visit preparation completed: Yes  Pain : No/denies pain     Nutritional Risks: None Diabetes: No  How often do you need to have someone help you when you read instructions, pamphlets, or other written materials from your doctor or pharmacy?: 1 - Never  Diabetic?yes  Nutrition Risk Assessment:  Has the patient had any N/V/D within the last 2 months?  No  Does the patient have any non-healing wounds?  No  Has the patient had any unintentional weight loss or weight gain?  No   Diabetes:  Is the patient diabetic?  Yes  If diabetic, was a CBG obtained today?  No  Did the patient bring in their glucometer from home?  No  How often do you monitor your CBG's? 2.  X day    Financial Strains and Diabetes Management:  Are you having any financial strains with the device, your supplies or your medication? No .  Does the patient want to be seen by Chronic Care Management for management of their diabetes?  No  Would the patient like to be referred to a Nutritionist or for Diabetic Management?  No   Diabetic Exams:  Diabetic Eye Exam: Completed 04/2022 Diabetic Foot Exam: Overdue, Pt has been advised about the importance in completing this exam. Pt is scheduled for diabetic foot exam on next office visit .   Interpreter Needed?: No  Information entered by :: Jadene Pierini, LPN   Activities of Daily Living    04/10/2022    2:40 PM  In your present state of health, do you have any difficulty performing the following activities:  Hearing? 0  Vision? 0  Difficulty concentrating or making decisions? 0  Walking or climbing stairs? 0  Dressing or bathing? 0  Doing errands, shopping? 0  Preparing Food and eating ? N  Using the Toilet? N  In the past six months, have you accidently leaked urine? N  Do you have problems with loss of bowel control? N  Managing your Medications? N  Managing your Finances? N  Housekeeping or managing your Housekeeping? N    Patient Care Team: [name], Evie Lacks, MD as PCP - General Nahser, Wonda Cheng, MD as PCP - Cardiology (Cardiology) Irene Shipper, MD as Consulting Physician (Gastroenterology) Madelon Lips, MD as Consulting Physician (Nephrology) Philemon Kingdom, MD as Consulting Physician (Internal Medicine) Szabat, Darnelle Maffucci, [facility] (Inactive) as Pharmacist (Pharmacist)  Indicate any recent Medical Services you may have received from other than [facility] providers in the past year (date may be approximate).     Assessment:   This is a routine wellness examination for [name].  Hearing/Vision screen Vision Screening - Comments:: Annual eye exams wear glasses   Dietary issues and exercise activities  discussed: Current Exercise Habits: Home exercise routine, Type of exercise: walking, Time (Minutes): 30, Frequency (Times/Week): 3, Weekly Exercise (Minutes/Week): 90, Intensity: Mild, Exercise limited by: None identified   Goals Addressed   None  Depression Screen    11/25/2021    1:29 PM 10/09/2020    1:28 PM 10/09/2020    1:27 PM 05/16/2019   10:48 AM 04/27/2018    1:23 PM 03/17/2017   10:16 AM 12/17/2015    9:29 AM  PHQ 2/9 Scores  PHQ - 2 Score 0 0 0 0 0 0 0  PHQ- 9 Score 2 0         Fall Risk    04/10/2022    2:37 PM 11/25/2021    1:34 PM 10/09/2020    1:28 PM 05/16/2019   10:48 AM 04/27/2018    1:23 PM  [place] in the past year? 0 1 0 0 No  Number falls in past yr: 0 0 0    Injury with Fall? 0 1 0    Risk for fall due to : No Fall Risks      Follow up Falls prevention discussed   Falls evaluation completed     FALL RISK PREVENTION PERTAINING TO THE HOME:  Any stairs in or around the home? Yes  If so, are there any without handrails? Yes  Home free of loose throw rugs in walkways, pet beds, electrical cords, etc? No  Adequate lighting in your home to reduce risk of falls? No   ASSISTIVE DEVICES UTILIZED TO PREVENT FALLS:  Life alert? No  Use of a cane, walker or w/c? No  Grab bars in the bathroom? No  Shower chair or bench in shower? Yes  Elevated toilet seat or a handicapped toilet? Yes          04/10/2022    2:41 PM  6CIT Screen  What Year? 0 points  What month? 0 points  What time? 0 points  Count back from 20 0 points  Months in reverse 0 points  Repeat phrase 0 points  Total Score 0 points    Immunizations There is no immunization history for the selected administration types on file for this patient.  TDAP status: Due, Education has been provided regarding the importance of this vaccine. Advised may receive this vaccine at local pharmacy or Health Dept. Aware to provide a copy of the vaccination record if obtained from local pharmacy  or Health Dept. Verbalized acceptance and understanding.  Flu Vaccine status: Due, Education has been provided regarding the importance of this vaccine. Advised may receive this vaccine at local pharmacy or Health Dept. Aware to provide a copy of the vaccination record if obtained from local pharmacy or Health Dept. Verbalized acceptance and understanding.  Pneumococcal vaccine status: Due, Education has been provided regarding the importance of this vaccine. Advised may receive this vaccine at local pharmacy or Health Dept. Aware to provide a copy of the vaccination record if obtained from local pharmacy or Health Dept. Verbalized acceptance and understanding.  Covid-19 vaccine status: Declined, Education has been provided regarding the importance of this vaccine but patient still declined. Advised may receive this vaccine at local pharmacy or Health Dept.or vaccine clinic. Aware to provide a copy of the vaccination record if obtained from local pharmacy or Health Dept. Verbalized acceptance and understanding.  Qualifies for Shingles Vaccine? Yes   Zostavax completed No   Shingrix Completed?: No.    Education has been provided regarding the importance of this vaccine. Patient has been advised to call insurance company to determine out of pocket expense if they have not yet received this vaccine. Advised may also receive vaccine at local pharmacy or Health  Dept. Verbalized acceptance and understanding.  Screening Tests Health Maintenance  Topic Date Due   Pneumonia Vaccine 58+ Years old (1 - PCV) Never done   Hepatitis C Screening  Never done   TETANUS/TDAP  Never done   Zoster Vaccines- Shingrix (1 of 2) Never done   OPHTHALMOLOGY EXAM  03/10/2017   Diabetic kidney evaluation - Urine ACR  05/06/2022   HEMOGLOBIN A1C  09/10/2022   FOOT EXAM  09/12/2022   COLONOSCOPY (Pts 45-5yr Insurance coverage will need to be confirmed)  09/30/2022   MAMMOGRAM  11/26/2022   Diabetic kidney evaluation -  [name] measurement  12/17/2022   DEXA SCAN  Completed   HPV VACCINES  Aged Out   INFLUENZA VACCINE  Discontinued   COVID-19 Vaccine  Discontinued    Health Maintenance  Health Maintenance Due  Topic Date Due   Pneumonia Vaccine 71 Years old (1 - PCV) Never done   Hepatitis C Screening  Never done   TETANUS/TDAP  Never done   Zoster Vaccines- Shingrix (1 of 2) Never done   OPHTHALMOLOGY EXAM  03/10/2017   Diabetic kidney evaluation - Urine ACR  05/06/2022    Colorectal cancer screening: Type of screening: Colonoscopy. Completed 09/29/2017. Repeat every 5 years  Mammogram status: Completed 11/25/2021. Repeat every year  Bone Density status: Completed 12/19/2018. Results reflect: Bone density results: OSTEOPENIA. Repeat every 5 years.  Lung Cancer Screening: (Low Dose CT Chest recommended if Age 71-80years, 30 pack-year currently smoking OR have quit w/in 15years.) does not qualify.   Lung Cancer Screening Referral: n/a  Additional Screening:  Hepatitis C Screening: does not qualify;   Vision Screening: Recommended annual ophthalmology exams for early detection of glaucoma and other disorders of the eye. Is the patient up to date with their annual eye exam?  Yes  Who is the provider or what is the name of the office in which the patient attends annual eye exams? Dr.Patel  If pt is not established with a provider, would they like to be referred to a provider to establish care? No .   Dental Screening: Recommended annual dental exams for proper oral hygiene  Community Resource Referral / Chronic Care Management: CRR required this visit?  No   CCM required this visit?  No      Plan:     I have personally reviewed and noted the following in the patient's chart:   Medical and social history Use of alcohol, tobacco or illicit drugs  Current medications and supplements including opioid prescriptions. Patient is not currently taking opioid prescriptions. Functional ability  and status Nutritional status Physical activity Advanced directives List of other physicians Hospitalizations, surgeries, and ER visits in previous 12 months Vitals Screenings to include cognitive, depression, and falls Referrals and appointments  In addition, I have reviewed and discussed with patient certain preventive protocols, quality metrics, and best practice recommendations. A written personalized care plan for preventive services as well as general preventive health recommendations were provided to patient.     LDaphane Shepherd LPN   [id number]  Nurse Notes: Due   All Vaccine     Medical screening examination/treatment/procedure(s) were performed by non-physician practitioner and as supervising physician I was immediately available for consultation/collaboration.  I agree with above. ALew Dawes MD

## 2022-04-10 NOTE — Patient Instructions (Signed)
[name] , Thank you for taking time to come for your Medicare Wellness Visit. I appreciate your ongoing commitment to your health goals. Please review the following plan we discussed and let me know if I can assist you in the future.   These are the goals we discussed:  Goals      Monitor and Manage My Blood Sugar-Diabetes Type 2     Timeframe:  Long-Range Goal Priority:  High Start Date: 02/27/2021                            Expected End Date:  08/30/2021                     Follow Up Date 05/30/2021   - check blood sugar at prescribed times - check blood sugar before and after exercise - check blood sugar if I feel it is too high or too low - take the blood sugar log to all doctor visits - take the blood sugar meter to all doctor visits    Why is this important?   Checking your blood sugar at home helps to keep it from getting very high or very low.  Writing the results in a diary or log helps the doctor know how to care for you.  Your blood sugar log should have the time, date and the results.  Also, write down the amount of insulin or other medicine that you take.  Other information, like what you ate, exercise done and how you were feeling, will also be helpful.        Track and Manage Symptoms-Heart Failure     Timeframe:  Long-Range Goal Priority:  High Start Date:   02/27/2021                         Expected End Date:  08/30/2021                     Follow Up Date 05/30/2021   - begin a heart failure diary - eat more whole grains, fruits and vegetables, lean meats and healthy fats - know when to call the doctor - dress right for the weather, hot or cold  -Check blood pressure / heart rate at least once daily   Why is this important?   You will be able to handle your symptoms better if you keep track of them.  Making some simple changes to your lifestyle will help.  Eating healthy is one thing you can do to take good care of yourself.          This is a  list of the screening recommended for you and due dates:  Health Maintenance  Topic Date Due   Pneumonia Vaccine (1 - PCV) Never done   Hepatitis C Screening: USPSTF Recommendation to screen - Ages 75-79 yo.  Never done   Tetanus Vaccine  Never done   Zoster (Shingles) Vaccine (1 of 2) Never done   Eye exam for diabetics  03/10/2017   Yearly kidney health urinalysis for diabetes  05/06/2022   Hemoglobin A1C  09/10/2022   Complete foot exam   09/12/2022   Colon Cancer Screening  09/30/2022   Mammogram  11/26/2022   Yearly kidney function blood test for diabetes  12/17/2022   DEXA scan (bone density measurement)  Completed   HPV Vaccine  Aged [employer]  Discontinued   COVID-19 Vaccine  Discontinued    Advanced directives: Advance directive discussed with you today. I have provided a copy for you to complete at home and have notarized. Once this is complete please bring a copy in to our office so we can scan it into your chart.   Conditions/risks identified: Aim for 30 minutes of exercise or brisk walking, 6-8 glasses of water, and 5 servings of fruits and vegetables each day.   Next appointment: Follow up in one year for your annual wellness visit    Preventive Care 65 Years and Older, Female Preventive care refers to lifestyle choices and visits with your health care provider that can promote health and wellness. What does preventive care include? A yearly physical exam. This is also called an annual well check. Dental exams once or twice a year. Routine eye exams. Ask your health care provider how often you should have your eyes checked. Personal lifestyle choices, including: Daily care of your teeth and gums. Regular physical activity. Eating a healthy diet. Avoiding tobacco and drug use. Limiting alcohol use. Practicing safe sex. Taking low-dose aspirin every day. Taking vitamin and mineral supplements as recommended by your health care provider. What happens during  an annual well check? The services and screenings done by your health care provider during your annual well check will depend on your age, overall health, lifestyle risk factors, and family history of disease. Counseling  Your health care provider may ask you questions about your: Alcohol use. Tobacco use. Drug use. Emotional well-being. Home and relationship well-being. Sexual activity. Eating habits. History of falls. Memory and ability to understand (cognition). Work and work Statistician. Reproductive health. Screening  You may have the following tests or measurements: Height, weight, and BMI. Blood pressure. Lipid and cholesterol levels. These may be checked every 5 years, or more frequently if you are over 84 years old. Skin check. Lung cancer screening. You may have this screening every year starting at age 47 if you have a 30-pack-year history of smoking and currently smoke or have quit within the past 15 years. Fecal occult blood test (FOBT) of the stool. You may have this test every year starting at age 41. Flexible sigmoidoscopy or colonoscopy. You may have a sigmoidoscopy every 5 years or a colonoscopy every 10 years starting at age 83. Hepatitis C blood test. Hepatitis B blood test. Sexually transmitted disease (STD) testing. Diabetes screening. This is done by checking your blood sugar (glucose) after you have not eaten for a while (fasting). You may have this done every 1-3 years. Bone density scan. This is done to screen for osteoporosis. You may have this done starting at age 2. Mammogram. This may be done every 1-2 years. Talk to your health care provider about how often you should have regular mammograms. Talk with your health care provider about your test results, treatment options, and if necessary, the need for more tests. Vaccines  Your health care provider may recommend certain vaccines, such as: Influenza vaccine. This is recommended every year. Tetanus,  diphtheria, and acellular pertussis (Tdap, Td) vaccine. You may need a Td booster every 10 years. Zoster vaccine. You may need this after age 56. Pneumococcal 13-valent conjugate (PCV13) vaccine. One dose is recommended after age 89. Pneumococcal polysaccharide (PPSV23) vaccine. One dose is recommended after age 49. Talk to your health care provider about which screenings and vaccines you need and how often you need them. This information is not intended to replace advice given  to you by your health care provider. Make sure you discuss any questions you have with your health care provider. Document Released: 07/19/2015 Document Revised: 03/11/2016 Document Reviewed: 04/23/2015 Elsevier Interactive Patient Education  2017 [employer] Prevention in the Home Falls can cause injuries. They can happen to people of all ages. There are many things you can do to make your home safe and to help prevent falls. What can I do on the outside of my home? Regularly fix the edges of walkways and driveways and fix any cracks. Remove anything that might make you trip as you walk through a door, such as a raised step or threshold. Trim any bushes or trees on the path to your home. Use bright outdoor lighting. Clear any walking paths of anything that might make someone trip, such as rocks or tools. Regularly check to see if handrails are loose or broken. Make sure that both sides of any steps have handrails. Any raised decks and porches should have guardrails on the edges. Have any leaves, snow, or ice cleared regularly. Use sand or salt on walking paths during winter. Clean up any spills in your garage right away. This includes oil or grease spills. What can I do in the bathroom? Use night lights. Install grab bars by the toilet and in the tub and shower. Do not use towel bars as grab bars. Use non-skid mats or decals in the tub or shower. If you need to sit down in the shower, use a plastic,  non-slip stool. Keep the floor dry. Clean up any water that spills on the floor as soon as it happens. Remove soap buildup in the tub or shower regularly. Attach bath mats securely with double-sided non-slip rug tape. Do not have throw rugs and other things on the floor that can make you trip. What can I do in the bedroom? Use night lights. Make sure that you have a light by your bed that is easy to reach. Do not use any sheets or blankets that are too big for your bed. They should not hang down onto the floor. Have a firm chair that has side arms. You can use this for support while you get dressed. Do not have throw rugs and other things on the floor that can make you trip. What can I do in the kitchen? Clean up any spills right away. Avoid walking on wet floors. Keep items that you use a lot in easy-to-reach places. If you need to reach something above you, use a strong step stool that has a grab bar. Keep electrical cords out of the way. Do not use floor polish or wax that makes floors slippery. If you must use wax, use non-skid floor wax. Do not have throw rugs and other things on the floor that can make you trip. What can I do with my stairs? Do not leave any items on the stairs. Make sure that there are handrails on both sides of the stairs and use them. Fix handrails that are broken or loose. Make sure that handrails are as long as the stairways. Check any carpeting to make sure that it is firmly attached to the stairs. Fix any carpet that is loose or worn. Avoid having throw rugs at the top or bottom of the stairs. If you do have throw rugs, attach them to the floor with carpet tape. Make sure that you have a light switch at the top of the stairs and the bottom of the stairs.  If you do not have them, ask someone to add them for you. What else can I do to help prevent falls? Wear shoes that: Do not have high heels. Have rubber bottoms. Are comfortable and fit you well. Are closed  at the toe. Do not wear sandals. If you use a stepladder: Make sure that it is fully opened. Do not climb a closed stepladder. Make sure that both sides of the stepladder are locked into place. Ask someone to hold it for you, if possible. Clearly mark and make sure that you can see: Any grab bars or handrails. First and last steps. Where the edge of each step is. Use tools that help you move around (mobility aids) if they are needed. These include: Canes. Walkers. Scooters. Crutches. Turn on the lights when you go into a dark area. Replace any light bulbs as soon as they burn out. Set up your furniture so you have a clear path. Avoid moving your furniture around. If any of your floors are uneven, fix them. If there are any pets around you, be aware of where they are. Review your medicines with your doctor. Some medicines can make you feel dizzy. This can increase your chance of falling. Ask your doctor what other things that you can do to help prevent falls. This information is not intended to replace advice given to you by your health care provider. Make sure you discuss any questions you have with your health care provider. Document Released: 04/18/2009 Document Revised: 11/28/2015 Document Reviewed: 07/27/2014 Elsevier Interactive Patient Education  2017 [employer].

## 2022-04-21 DIAGNOSIS — E113513 Type 2 diabetes mellitus with proliferative diabetic retinopathy with macular edema, bilateral: Secondary | ICD-10-CM | POA: Diagnosis not present

## 2022-04-21 DIAGNOSIS — H25813 Combined forms of age-related cataract, bilateral: Secondary | ICD-10-CM | POA: Diagnosis not present

## 2022-04-21 DIAGNOSIS — H31093 Other chorioretinal scars, bilateral: Secondary | ICD-10-CM | POA: Diagnosis not present

## 2022-04-21 DIAGNOSIS — H3582 Retinal ischemia: Secondary | ICD-10-CM | POA: Diagnosis not present

## 2022-04-21 DIAGNOSIS — H35033 Hypertensive retinopathy, bilateral: Secondary | ICD-10-CM | POA: Diagnosis not present

## 2022-04-21 DIAGNOSIS — H3562 Retinal hemorrhage, left eye: Secondary | ICD-10-CM | POA: Diagnosis not present

## 2022-05-15 ENCOUNTER — Telehealth: Payer: Medicare Other

## 2022-05-26 ENCOUNTER — Other Ambulatory Visit: Payer: Self-pay | Admitting: Internal Medicine

## 2022-05-30 ENCOUNTER — Other Ambulatory Visit: Payer: Self-pay | Admitting: Internal Medicine

## 2022-06-03 DIAGNOSIS — E113512 Type 2 diabetes mellitus with proliferative diabetic retinopathy with macular edema, left eye: Secondary | ICD-10-CM | POA: Diagnosis not present

## 2022-06-09 ENCOUNTER — Other Ambulatory Visit: Payer: Self-pay | Admitting: Internal Medicine

## 2022-06-10 DIAGNOSIS — I5189 Other ill-defined heart diseases: Secondary | ICD-10-CM | POA: Diagnosis not present

## 2022-06-10 DIAGNOSIS — I129 Hypertensive chronic kidney disease with stage 1 through stage 4 chronic kidney disease, or unspecified chronic kidney disease: Secondary | ICD-10-CM | POA: Diagnosis not present

## 2022-06-10 DIAGNOSIS — N189 Chronic kidney disease, unspecified: Secondary | ICD-10-CM | POA: Diagnosis not present

## 2022-06-10 DIAGNOSIS — N2581 Secondary hyperparathyroidism of renal origin: Secondary | ICD-10-CM | POA: Diagnosis not present

## 2022-06-10 DIAGNOSIS — E1122 Type 2 diabetes mellitus with diabetic chronic kidney disease: Secondary | ICD-10-CM | POA: Diagnosis not present

## 2022-06-10 DIAGNOSIS — Z720 Tobacco use: Secondary | ICD-10-CM | POA: Diagnosis not present

## 2022-06-10 DIAGNOSIS — N184 Chronic kidney disease, stage 4 (severe): Secondary | ICD-10-CM | POA: Diagnosis not present

## 2022-06-10 DIAGNOSIS — D631 Anemia in chronic kidney disease: Secondary | ICD-10-CM | POA: Diagnosis not present

## 2022-06-10 DIAGNOSIS — E785 Hyperlipidemia, unspecified: Secondary | ICD-10-CM | POA: Diagnosis not present

## 2022-06-10 LAB — BASIC METABOLIC PANEL
BUN: 83 — AB (ref 4–21)
CO2: 22 (ref 13–22)
Chloride: 104 (ref 99–108)
Creatinine: 3 — AB (ref 0.5–1.1)
Glucose: 68
Potassium: 4.4 mEq/L (ref 3.5–5.1)
Sodium: 139 (ref 137–147)

## 2022-06-10 LAB — COMPREHENSIVE METABOLIC PANEL
Albumin: 4.7 (ref 3.5–5.0)
Calcium: 9.8 (ref 8.7–10.7)
eGFR: 16

## 2022-06-10 LAB — IRON,TIBC AND FERRITIN PANEL
%SAT: 9
Ferritin: 20
Iron: 31
TIBC: 363
UIBC: 332

## 2022-06-10 LAB — CBC: RBC: 3.48 — AB (ref 3.87–5.11)

## 2022-06-10 LAB — CBC AND DIFFERENTIAL
HCT: 30 — AB (ref 36–46)
Hemoglobin: 9.5 — AB (ref 12.0–16.0)
Platelets: 161 10*3/uL (ref 150–400)
WBC: 5.4

## 2022-06-10 LAB — PROTEIN / CREATININE RATIO, URINE
Albumin, U: 18.8
Creatinine, Urine: 20.1

## 2022-06-10 LAB — MICROALBUMIN / CREATININE URINE RATIO: Microalb Creat Ratio: 94

## 2022-06-10 LAB — VITAMIN D 25 HYDROXY (VIT D DEFICIENCY, FRACTURES): Vit D, 25-Hydroxy: 34.4

## 2022-06-18 ENCOUNTER — Encounter: Payer: Self-pay | Admitting: Internal Medicine

## 2022-06-24 ENCOUNTER — Other Ambulatory Visit: Payer: Self-pay | Admitting: Internal Medicine

## 2022-06-26 ENCOUNTER — Encounter (HOSPITAL_COMMUNITY): Payer: Medicare Other

## 2022-07-08 ENCOUNTER — Other Ambulatory Visit (HOSPITAL_COMMUNITY): Payer: Self-pay | Admitting: *Deleted

## 2022-07-09 ENCOUNTER — Encounter (HOSPITAL_COMMUNITY): Payer: Medicare Other

## 2022-07-09 ENCOUNTER — Encounter (HOSPITAL_COMMUNITY)
Admission: RE | Admit: 2022-07-09 | Discharge: 2022-07-09 | Disposition: A | Discharge status: Home / Self Care | Payer: 59 | Source: Ambulatory Visit | Attending: Nephrology | Admitting: Nephrology

## 2022-07-09 DIAGNOSIS — N189 Chronic kidney disease, unspecified: Secondary | ICD-10-CM | POA: Insufficient documentation

## 2022-07-09 DIAGNOSIS — D631 Anemia in chronic kidney disease: Secondary | ICD-10-CM | POA: Diagnosis not present

## 2022-07-09 MED ORDER — SODIUM CHLORIDE 0.9 % IV SOLN
510.0000 mg | INTRAVENOUS | Status: DC
  Administered 2022-07-09: 510 mg via INTRAVENOUS
  Filled 2022-07-09: qty 510

## 2022-07-16 ENCOUNTER — Encounter (HOSPITAL_COMMUNITY)
Admission: RE | Admit: 2022-07-16 | Discharge: 2022-07-16 | Disposition: A | Discharge status: Home / Self Care | Payer: 59 | Source: Ambulatory Visit | Attending: Nephrology | Admitting: Nephrology

## 2022-07-16 ENCOUNTER — Encounter (HOSPITAL_COMMUNITY): Payer: Medicare Other

## 2022-07-16 DIAGNOSIS — N189 Chronic kidney disease, unspecified: Secondary | ICD-10-CM | POA: Diagnosis not present

## 2022-07-16 DIAGNOSIS — D631 Anemia in chronic kidney disease: Secondary | ICD-10-CM | POA: Diagnosis not present

## 2022-07-16 MED ORDER — SODIUM CHLORIDE 0.9 % IV SOLN
510.0000 mg | INTRAVENOUS | Status: DC
  Administered 2022-07-16: 510 mg via INTRAVENOUS
  Filled 2022-07-16: qty 510

## 2022-08-27 ENCOUNTER — Encounter: Payer: Self-pay | Admitting: Internal Medicine

## 2022-08-27 ENCOUNTER — Ambulatory Visit (INDEPENDENT_AMBULATORY_CARE_PROVIDER_SITE_OTHER): Payer: 59

## 2022-08-27 ENCOUNTER — Ambulatory Visit (INDEPENDENT_AMBULATORY_CARE_PROVIDER_SITE_OTHER): Payer: 59 | Admitting: Internal Medicine

## 2022-08-27 VITALS — BP 130/68 | HR 70 | Temp 98.4°F | Ht 62.0 in | Wt 187.0 lb

## 2022-08-27 DIAGNOSIS — E1165 Type 2 diabetes mellitus with hyperglycemia: Secondary | ICD-10-CM | POA: Diagnosis not present

## 2022-08-27 DIAGNOSIS — I82412 Acute embolism and thrombosis of left femoral vein: Secondary | ICD-10-CM | POA: Diagnosis not present

## 2022-08-27 DIAGNOSIS — R0602 Shortness of breath: Secondary | ICD-10-CM | POA: Diagnosis not present

## 2022-08-27 DIAGNOSIS — R059 Cough, unspecified: Secondary | ICD-10-CM | POA: Diagnosis not present

## 2022-08-27 DIAGNOSIS — R052 Subacute cough: Secondary | ICD-10-CM

## 2022-08-27 DIAGNOSIS — R21 Rash and other nonspecific skin eruption: Secondary | ICD-10-CM | POA: Diagnosis not present

## 2022-08-27 DIAGNOSIS — J449 Chronic obstructive pulmonary disease, unspecified: Secondary | ICD-10-CM | POA: Diagnosis not present

## 2022-08-27 DIAGNOSIS — E1159 Type 2 diabetes mellitus with other circulatory complications: Secondary | ICD-10-CM

## 2022-08-27 DIAGNOSIS — E1122 Type 2 diabetes mellitus with diabetic chronic kidney disease: Secondary | ICD-10-CM

## 2022-08-27 DIAGNOSIS — I110 Hypertensive heart disease with heart failure: Secondary | ICD-10-CM

## 2022-08-27 DIAGNOSIS — I5042 Chronic combined systolic (congestive) and diastolic (congestive) heart failure: Secondary | ICD-10-CM

## 2022-08-27 DIAGNOSIS — N184 Chronic kidney disease, stage 4 (severe): Secondary | ICD-10-CM

## 2022-08-27 MED ORDER — TRIAMCINOLONE ACETONIDE 0.1 % EX CREA: 1.0000 | TOPICAL_CREAM | Freq: Three times a day (TID) | CUTANEOUS | 3 refills | Status: AC

## 2022-08-27 MED ORDER — CEFDINIR 300 MG PO CAPS: 300.0000 mg | ORAL_CAPSULE | Freq: Two times a day (BID) | ORAL | 0 refills | Status: DC

## 2022-08-27 MED ORDER — FLUCONAZOLE 150 MG PO TABS: 150.0000 mg | ORAL_TABLET | Freq: Once | ORAL | 1 refills | Status: AC

## 2022-08-27 MED ORDER — HYDROCODONE BIT-HOMATROP MBR 5-1.5 MG/5ML PO SOLN: 5.0000 mL | Freq: Four times a day (QID) | ORAL | 0 refills | Status: DC | PRN

## 2022-08-27 NOTE — Assessment & Plan Note (Signed)
F/u w/Endo

## 2022-08-27 NOTE — Assessment & Plan Note (Signed)
?  eczema on chest - start Triamcilolone tid RTC if not resolved

## 2022-08-27 NOTE — Assessment & Plan Note (Signed)
BP is nl now

## 2022-08-27 NOTE — Assessment & Plan Note (Signed)
On Eliquis for DVT

## 2022-08-27 NOTE — Assessment & Plan Note (Signed)
F/u w/Dr Hollie Salk

## 2022-08-27 NOTE — Patient Instructions (Signed)
Please check w/Dr Cathie Olden and Dr Hollie Salk if we can stop Eliquis in May 2024

## 2022-08-27 NOTE — Assessment & Plan Note (Signed)
Pt declined MDI

## 2022-08-27 NOTE — Assessment & Plan Note (Signed)
New. R/o CAP Pt declined steroids, MDI Start Omnicef, Hycodan CXR

## 2022-08-27 NOTE — Assessment & Plan Note (Addendum)
L femoral vein DVT post-traumatic On Eliquis: check w/Dr Cathie Olden and Dr Hollie Salk if can stop Eliquis in May

## 2022-08-27 NOTE — Progress Notes (Signed)
Subjective:  Patient ID: Javita Sorrells, female    DOB: 09/20/1950  Age: 72 y.o. MRN: [id number]  CC: No chief complaint on file.   HPI Kurstie Philson presents for cough x 4 weeks C/o rash on chest x 1 month  F/u HTN, anticoagulation  Outpatient Medications Prior to Visit  Medication Sig Dispense Refill   acetaminophen (TYLENOL) 325 MG tablet Take 325 mg by mouth every 6 (six) hours as needed.     allopurinol (ZYLOPRIM) 100 MG tablet Take 50 mg by mouth daily.     amLODipine (NORVASC) 10 MG tablet TAKE ONE-HALF TABLET BY MOUTH  DAILY 50 tablet 1   Ascorbic Acid (VITAMIN C) 1000 MG tablet Take 1,000 mg by mouth daily.     calcium carbonate (OS-CAL) 600 MG TABS tablet Take 600 mg by mouth 2 (two) times daily with a meal.     Cholecalciferol (VITAMIN D3) 50 MCG (2000 UT) capsule Take 1 capsule (2,000 Units total) by mouth daily. 100 capsule 3   ELIQUIS 5 MG TABS tablet TAKE 1 TABLET BY MOUTH TWICE A DAY 60 tablet 5   ferrous sulfate 325 (65 FE) MG tablet Take 325 mg by mouth 2 (two) times daily with a meal.     glipiZIDE (GLUCOTROL XL) 5 MG 24 hr tablet TAKE 1 TABLET BY MOUTH DAILY  WITH BREAKFAST 90 tablet 3   hydrALAZINE (APRESOLINE) 25 MG tablet TAKE 1 TABLET BY MOUTH THREE TIMES A DAY 270 tablet 3   lansoprazole (PREVACID) 15 MG capsule Take 1 capsule (15 mg total) by mouth daily at 12 noon. 90 capsule 3   nebivolol (BYSTOLIC) 10 MG tablet TAKE 1 TABLET BY MOUTH EVERY DAY 90 tablet 3   ondansetron (ZOFRAN) 4 MG tablet Take 1 tablet (4 mg total) by mouth every 8 (eight) hours as needed for nausea or vomiting. 20 tablet 0   ONETOUCH ULTRA test strip USE TO TEST BLOOD SUGAR  TWICE DAILY AS DIRECTED 200 strip 2   potassium chloride SA (KLOR-CON M) 20 MEQ tablet TAKE 1 TABLET BY MOUTH DAILY 100 tablet 1   rosuvastatin (CRESTOR) 5 MG tablet TAKE 1 TABLET (5 MG TOTAL) BY MOUTH DAILY. TAKE 1 TABLET BY MOUTH DAILY AT 6PM 90 tablet 2   torsemide (DEMADEX) 10 MG tablet Take 2.5  tablets (25 mg total) by mouth daily. (Patient taking differently: Take 25 mg by mouth daily. Pt taking 25 mg alternating with 50 mg every other day.) 225 tablet 3   vitamin B-12 (CYANOCOBALAMIN) 1000 MCG tablet Take 1,000 mcg by mouth daily.     No facility-administered medications prior to visit.    ROS: Review of Systems  Constitutional:  Negative for activity change, appetite change, chills, fatigue and unexpected weight change.  HENT:  Positive for sinus pressure. Negative for congestion and mouth sores.   Eyes:  Negative for visual disturbance.  Respiratory:  Positive for cough, shortness of breath and wheezing. Negative for chest tightness.   Gastrointestinal:  Negative for abdominal pain and nausea.  Genitourinary:  Negative for difficulty urinating, frequency and vaginal pain.  Musculoskeletal:  Negative for back pain and gait problem.  Skin:  Negative for pallor and rash.  Neurological:  Negative for dizziness, tremors, weakness, numbness and headaches.  Psychiatric/Behavioral:  Negative for confusion and sleep disturbance.     Objective:  BP 130/68 (BP Location: Left Arm, Patient Position: Sitting, Cuff Size: Normal)   Pulse 70   Temp 98.4 F (36.9 C) (Oral)  Ht 5' 2"$  (1.575 m)   Wt 187 lb (84.8 kg)   LMP  (LMP Unknown)   SpO2 98%   BMI 34.20 kg/m   BP Readings from Last 3 Encounters:  08/27/22 130/68  07/16/22 (!) 143/54  07/09/22 (!) 149/61    Wt Readings from Last 3 Encounters:  08/27/22 187 lb (84.8 kg)  07/09/22 183 lb (83 kg)  04/10/22 184 lb (83.5 kg)    Physical Exam Constitutional:      General: She is not in acute distress.    Appearance: She is well-developed. She is obese.  HENT:     Head: Normocephalic.     Right Ear: External ear normal.     Left Ear: External ear normal.     Nose: Nose normal.  Eyes:     General:        Right eye: No discharge.        Left eye: No discharge.     Conjunctiva/sclera: Conjunctivae normal.     Pupils:  Pupils are equal, round, and reactive to light.  Neck:     Thyroid: No thyromegaly.     Vascular: No JVD.     Trachea: No tracheal deviation.  Cardiovascular:     Rate and Rhythm: Normal rate and regular rhythm.     Heart sounds: Normal heart sounds.  Pulmonary:     Effort: No respiratory distress.     Breath sounds: No stridor. No wheezing.  Abdominal:     General: Bowel sounds are normal. There is no distension.     Palpations: Abdomen is soft. There is no mass.     Tenderness: There is no abdominal tenderness. There is no guarding or rebound.  Musculoskeletal:        General: No tenderness.     Cervical back: Normal range of motion and neck supple. No rigidity.  Lymphadenopathy:     Cervical: No cervical adenopathy.  Skin:    Findings: No erythema or rash.  Neurological:     Mental Status: She is oriented to person, place, and time.     Cranial Nerves: No cranial nerve deficit.     Motor: No abnormal muscle tone.     Coordination: Coordination normal.     Gait: Gait abnormal.     Deep Tendon Reflexes: Reflexes normal.  Psychiatric:        Behavior: Behavior normal.        Thought Content: Thought content normal.        Judgment: Judgment normal.    Rash on chest LS w/pain   Lab Results  Component Value Date   WBC 5.4 06/10/2022   HGB 9.5 (A) 06/10/2022   HCT 30 (A) 06/10/2022   PLT 161 06/10/2022   GLUCOSE 136 (H) 12/16/2021   CHOL 129 12/16/2021   TRIG 78 12/16/2021   HDL 49 12/16/2021   LDLCALC 65 12/16/2021   ALT 11 12/16/2021   AST 14 07/17/2021   NA 139 06/10/2022   K 4.4 06/10/2022   CL 104 06/10/2022   CREATININE 3.0 (A) 06/10/2022   BUN 83 (A) 06/10/2022   CO2 22 06/10/2022   TSH 1.51 07/17/2021   INR 1.1 06/17/2020   HGBA1C 7.1 (A) 03/12/2022   MICROALBUR 13.9 (H) 10/13/2007    No results found.  Assessment & Plan:   Problem List Items Addressed This Visit       Cardiovascular and Mediastinum   Hypertensive heart disease with  congestive heart failure ([place])  BP is nl now      DVT femoral (deep venous thrombosis) with thrombophlebitis, left (HCC)    L femoral vein DVT post-traumatic On Eliquis: check w/Dr Cathie Olden and Dr Hollie Salk if can stop Eliquis in May      Chronic combined systolic and diastolic CHF (congestive heart failure) (HCC) - Primary (Chronic)    On Eliquis for DVT        Respiratory   COPD mixed type ([place])    Pt declined MDI      Relevant Medications   HYDROcodone bit-homatropine (HYCODAN) 5-1.5 MG/5ML syrup     Endocrine   Poorly controlled type 2 diabetes mellitus with circulatory disorder (HCC)    F/u w/Endo      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 R/o CAP Pt declined steroids, MDI Start Omnicef, Hycodan CXR      Relevant Orders   DG Chest 2 View      Meds ordered this encounter  Medications   fluconazole (DIFLUCAN) 150 MG tablet    Sig: Take 1 tablet (150 mg total) by mouth once for 1 dose.    Dispense:  1 tablet    Refill:  1   triamcinolone cream (KENALOG) 0.1 %    Sig: Apply 1 Application topically 3 (three) times daily. On rash    Dispense:  160 g    Refill:  3   cefdinir (OMNICEF) 300 MG capsule    Sig: Take 1 capsule (300 mg total) by mouth 2 (two) times daily.    Dispense:  20 capsule    Refill:  0   HYDROcodone bit-homatropine (HYCODAN) 5-1.5 MG/5ML syrup    Sig: Take 5 mLs by mouth every 6 (six) hours as needed for cough.    Dispense:  240 mL    Refill:  0      Follow-up: No follow-ups on file.  Walker Kehr, MD

## 2022-08-31 DIAGNOSIS — E113511 Type 2 diabetes mellitus with proliferative diabetic retinopathy with macular edema, right eye: Secondary | ICD-10-CM | POA: Diagnosis not present

## 2022-08-31 DIAGNOSIS — H25813 Combined forms of age-related cataract, bilateral: Secondary | ICD-10-CM | POA: Diagnosis not present

## 2022-08-31 DIAGNOSIS — H35033 Hypertensive retinopathy, bilateral: Secondary | ICD-10-CM | POA: Diagnosis not present

## 2022-08-31 DIAGNOSIS — H3582 Retinal ischemia: Secondary | ICD-10-CM | POA: Diagnosis not present

## 2022-08-31 DIAGNOSIS — H31093 Other chorioretinal scars, bilateral: Secondary | ICD-10-CM | POA: Diagnosis not present

## 2022-08-31 DIAGNOSIS — E113513 Type 2 diabetes mellitus with proliferative diabetic retinopathy with macular edema, bilateral: Secondary | ICD-10-CM | POA: Diagnosis not present

## 2022-09-11 ENCOUNTER — Telehealth: Payer: Self-pay | Admitting: Internal Medicine

## 2022-09-11 ENCOUNTER — Encounter: Payer: Self-pay | Admitting: Internal Medicine

## 2022-09-11 ENCOUNTER — Ambulatory Visit (INDEPENDENT_AMBULATORY_CARE_PROVIDER_SITE_OTHER): Payer: 59 | Admitting: Internal Medicine

## 2022-09-11 VITALS — BP 138/86 | HR 75 | Ht 62.0 in | Wt 188.6 lb

## 2022-09-11 DIAGNOSIS — E049 Nontoxic goiter, unspecified: Secondary | ICD-10-CM

## 2022-09-11 DIAGNOSIS — E1159 Type 2 diabetes mellitus with other circulatory complications: Secondary | ICD-10-CM

## 2022-09-11 DIAGNOSIS — E785 Hyperlipidemia, unspecified: Secondary | ICD-10-CM | POA: Diagnosis not present

## 2022-09-11 DIAGNOSIS — E1165 Type 2 diabetes mellitus with hyperglycemia: Secondary | ICD-10-CM | POA: Diagnosis not present

## 2022-09-11 LAB — POCT GLYCOSYLATED HEMOGLOBIN (HGB A1C): Hemoglobin A1C: 6.9 % — AB (ref 4.0–5.6)

## 2022-09-11 NOTE — Telephone Encounter (Signed)
Prescription Request  09/11/2022  LOV: 08/27/2022  What is the name of the medication or equipment? HYDROcodone bit-homatropine (HYCODAN) 5-1.5 MG/5ML syrup   Have you contacted your pharmacy to request a refill? No   Which pharmacy would you like this sent to?  [id number] [address]Phone: [phone]:[phone] [facility] ([place] [address]ES[address] [place]Phone: [phone]: [address] [address]Phone: [phone]: [phone]   Patient notified that their request is being sent to the clinical staff for review and that they should receive a response within 2 business days.   Please advise at Mobile [phone](mobile)

## 2022-09-11 NOTE — Patient Instructions (Signed)
Please continue: - Glipizide ER 5-10 mg before breakfast  Please return in 6 months with your sugar log. 

## 2022-09-11 NOTE — Progress Notes (Signed)
Patient ID: [name], female   DOB: 21-Jun-1951, 72 y.o.   MRN: [id number]  HPI: [name] is a 72 y.o.-year-old female, returning for f/u DM2, dx in 2006, insulin-independent, uncontrolled, with complications (CAD - s/p AMI 06/2013, CHF; CKD; PN; DR). Last visit 6 months ago.  Interim history: No increased urination, blurry vision, nausea, chest pain. Last year, her sugars increased to 400s after getting steroids for left foot gout.  No steroids since last visit. She is on tart cherry diet juice. She is on the Mediterranean diet x 2 weeks.  Reviewed HbA1c levels: Lab Results  Component Value Date   HGBA1C 7.1 (A) 03/12/2022   HGBA1C 7.3 (A) 09/11/2021   HGBA1C 7.7 (A) 05/12/2021  05/12/2021: HbA1c calculated from fructosamine is: 7.35%, lower than the directly measured HbA1c. 05/16/2020: HbA1c calculated from fructosamine is: 7.4%, lower than the directly measured HbA1c. 05/02/2019: HbA1c calculated from fructosamine was 7.0% 12/30/2018: HbA1c calculated from fructosamine is higher, at 8.2%, possibly 2/2 drinking juice 08/24/2018: HbA1c calculated from fructosamine is higher, at 8%, possibly 2/2 ABx  04/20/2018: HbA1c calculated from fructosamine is slightly better than before, at 7% 12/15/2017: HbA1c calculated from fructosamine: 7.17%, slightly improved from last visit  08/17/2017: HbA1c calculated from fructosamine: 7.2%, slightly improved 04/15/2017: HbA1c calculated from the fructosamine is lower than measured, at 7.3% 08/21/2016: HbA1c 6.0%. She started to change her diet after her hemoglobin A1c returned high, at 16.2% in 09/2014. She cut down portions, changed the meal content to include more fiber and less carbs.  HbA1c decreased dramatically.  Pt is on: - Glipizide ER 5 mg in a.m.-started 2016 >> 10 mg >> 5 mg before breakfast (10 mg during steroid treatment) >> 5-10 milligrams in a.m. She stopped metformin ER in 01/2017. She tried Tradjenta 5 mg daily in  am >> CP with it (started 10/2014) >> had to stop She tried [place] >> decreased [name] and yeast inf. She tried regular metformin >> diarrhea She refused insulin in the past.  Pt checks her sugars twice a day per review of her log: - am:  890-124, 140 >> 70, 79-128, 136, 138 >> 77, 84-121, 127 - 2h after b'fast: 122-139 >> 129-144 >> 130-140 >> 114 >> n/c  - before lunch: 125 >> 89, 121-151 >> 103-141, 158 >> n/c - 2h after lunch: 169 >> 120-130 >> n/c  - before dinner:  110-144 >> 118-130 >> n/c - 2h after dinner: 119-138, 149 >> 97-144, 149 >> 70, 120-144, 147 - bedtime: 137 >> n/c >> 147-157 >> n/c - nighttime: n/c Lowest sugar was  66  >> 77 >> 87 >> 90 >> 77; it is unclear at which level she has hypoglycemia awareness. Highest sugar was 400 (Prednisone) >> 180 >> 149 >> 147 >> 147.  Glucometer: One Touch Ultra  Pt's meals are: - Breakfast: oatmeal, cereals, [place] bacon, egg toast; cinnamon on oatmeal or cheerios - Lunch: sandwich or fruit salad - Dinner: salmon/chicken, Brussel sprouts, other veggies, sweet potatoes or brown rice, spaghetti - Snacks: 2: carrots with dip; yoghurt with fruit, veggies + dip; fruit She walks for exercise. She saw Dr. Hollie Salk with nephrology.  She was started on a high potassium, low protein diet  -+ CKD-sees nephrology; latest BUN/creatinine: 06/10/2022: Protein/creatinine ratio 259 (0-200) Lab Results  Component Value Date   BUN 83 (A) 06/10/2022   CREATININE 3.0 (A) 06/10/2022  02/05/2022: 66/2.72, [name] 18, glucose 136, protein to creatinine ratio 105 (0-200), ACR 37 (0-29) 08/09/2020: 68/2.15,  [name] 26, glucose 145, protein to creatinine ratio 68, ACR 9 She stopped olmesartan due to worsening kidney function and cough.  -+ HL; last set of lipids: Lab Results  Component Value Date   CHOL 129 12/16/2021   HDL 49 12/16/2021   LDLCALC 65 12/16/2021   TRIG 78 12/16/2021   CHOLHDL 2.6 12/16/2021  On Crestor 5 daily. Added Krill oil.  - last eye exam  was in 12/2021: + DR, + cataract reportedly.  Had laser surgeries. Dr. Posey Pronto.   - she has numbness and tingling in her feet.  Last foot exam 09/11/2021.  She also has a history of HTN, GERD, and anemia. In 02/2020, she was admitted multiple perineal abscesses and cellulitis.  These were I&D and she was started on antibiotics. She was in [facility] for 2 weeks - she did not like her care there, including the meals. Of note, patient discussed with nephrology about dialysis and she would never want to proceed with this. She was started on Eliquis after a traumatic DVT in 11/2021.   Goiter: A thyroid ultrasound from 2016 showed only small cysts.  Pt denies: - feeling nodules in neck - hoarseness - dysphagia - choking  Latest TSH normal: Lab Results  Component Value Date   TSH 1.51 07/17/2021   ROS: + see HPI  I reviewed pt's medications, allergies, PMH, social hx, family hx, and changes were documented in the history of present illness. Otherwise, unchanged from my initial visit note.  Past Medical History:  Diagnosis Date   Anemia    COPD (chronic obstructive pulmonary disease) ([place])    Diabetes mellitus    Hyperlipidemia    Hypertension    Ischemic cardiomyopathy 06/11/2013   Low back pain    NSTEMI (non-ST elevated myocardial infarction) ([place]) 06/11/2013   Obesity    Personal history of colon cancer    Renal insufficiency    Past Surgical History:  Procedure Laterality Date   COLECTOMY     INCISION AND DRAINAGE PERIRECTAL ABSCESS N/A 03/07/2020   Procedure: IRRIGATION AND DEBRIDEMENT PERINEUM  AND PANNUS ABSCESS;  Surgeon: Erroll Luna, MD;  Location: [facility];  Service: General;  Laterality: N/A;   IR RADIOLOGIST EVAL & MGMT  08/06/2020   LEFT HEART CATHETERIZATION WITH CORONARY ANGIOGRAM N/A 06/09/2013   Procedure: LEFT HEART CATHETERIZATION WITH CORONARY ANGIOGRAM;  Surgeon: Ramond Dial, MD;  Location: [facility] CATH LAB;  Service: Cardiovascular;  Laterality: N/A;    History   Social History   Marital Status: Single    Spouse Name: N/A   Number of Children: 0   Occupational History   retired   Social History Main Topics   Smoking status:  former smoker     Types: Cigarettes   Smokeless tobacco: Never Used   Alcohol Use: No   Drug Use: No   Current Outpatient Medications on File Prior to Visit  Medication Sig Dispense Refill   acetaminophen (TYLENOL) 325 MG tablet Take 325 mg by mouth every 6 (six) hours as needed.     allopurinol (ZYLOPRIM) 100 MG tablet Take 50 mg by mouth daily.     amLODipine (NORVASC) 10 MG tablet TAKE ONE-HALF TABLET BY MOUTH  DAILY 50 tablet 1   Ascorbic Acid (VITAMIN C) 1000 MG tablet Take 1,000 mg by mouth daily.     calcium carbonate (OS-CAL) 600 MG TABS tablet Take 600 mg by mouth 2 (two) times daily with a meal.     cefdinir (OMNICEF) 300  MG capsule Take 1 capsule (300 mg total) by mouth 2 (two) times daily. 20 capsule 0   Cholecalciferol (VITAMIN D3) 50 MCG (2000 UT) capsule Take 1 capsule (2,000 Units total) by mouth daily. 100 capsule 3   ELIQUIS 5 MG TABS tablet TAKE 1 TABLET BY MOUTH TWICE A DAY 60 tablet 5   ferrous sulfate 325 (65 FE) MG tablet Take 325 mg by mouth 2 (two) times daily with a meal.     glipiZIDE (GLUCOTROL XL) 5 MG 24 hr tablet TAKE 1 TABLET BY MOUTH DAILY  WITH BREAKFAST 90 tablet 3   hydrALAZINE (APRESOLINE) 25 MG tablet TAKE 1 TABLET BY MOUTH THREE TIMES A DAY 270 tablet 3   HYDROcodone bit-homatropine (HYCODAN) 5-1.5 MG/5ML syrup Take 5 mLs by mouth every 6 (six) hours as needed for cough. 240 mL 0   lansoprazole (PREVACID) 15 MG capsule Take 1 capsule (15 mg total) by mouth daily at 12 noon. 90 capsule 3   nebivolol (BYSTOLIC) 10 MG tablet TAKE 1 TABLET BY MOUTH EVERY DAY 90 tablet 3   ondansetron (ZOFRAN) 4 MG tablet Take 1 tablet (4 mg total) by mouth every 8 (eight) hours as needed for nausea or vomiting. 20 tablet 0   ONETOUCH ULTRA test strip USE TO TEST BLOOD SUGAR  TWICE DAILY AS  DIRECTED 200 strip 2   potassium chloride SA (KLOR-CON M) 20 MEQ tablet TAKE 1 TABLET BY MOUTH DAILY 100 tablet 1   rosuvastatin (CRESTOR) 5 MG tablet TAKE 1 TABLET (5 MG TOTAL) BY MOUTH DAILY. TAKE 1 TABLET BY MOUTH DAILY AT 6PM 90 tablet 2   torsemide (DEMADEX) 10 MG tablet Take 2.5 tablets (25 mg total) by mouth daily. (Patient taking differently: Take 25 mg by mouth daily. Pt taking 25 mg alternating with 50 mg every other day.) 225 tablet 3   triamcinolone cream (KENALOG) 0.1 % Apply 1 Application topically 3 (three) times daily. On rash 160 g 3   vitamin B-12 (CYANOCOBALAMIN) 1000 MCG tablet Take 1,000 mcg by mouth daily.     No current facility-administered medications on file prior to visit.   Allergies  Allergen Reactions   Tradjenta [Linagliptin] Anaphylaxis    CP   Atorvastatin     REACTION: aches and pains   Enalapril Maleate     REACTION: cough   Farxiga [Dapagliflozin] Itching   Hydrochlorothiazide     REACTION: hair loss   Kenalog [Triamcinolone Acetonide]     HANDS NUMB    Levaquin [Levofloxacin] Hives   Metformin And Related     Diarrhea, dizziness   Propoxyphene N-Acetaminophen Hives   Simvastatin     REACTION: cramps   Spironolactone     REACTION: cramps   Family History  Problem Relation Age of Onset   Hypertension Other    PE: BP 138/86 (BP Location: Right Arm, Patient Position: Sitting, Cuff Size: Normal)   Pulse 75   Ht '5\' 2"'$  (1.575 m)   Wt 188 lb 9.6 oz (85.5 kg)   LMP  (LMP Unknown)   SpO2 93%   BMI 34.50 kg/m   Wt Readings from Last 3 Encounters:  09/11/22 188 lb 9.6 oz (85.5 kg)  08/27/22 187 lb (84.8 kg)  07/09/22 183 lb (83 kg)   Constitutional: overweight, in NAD Eyes: EOMI, no exophthalmos ENT: no thyromegaly, no cervical lymphadenopathy Cardiovascular: RRR, No RG, + 1/6 SEM, + L>R LE edema Respiratory: CTA B Musculoskeletal: no deformities Skin: no rashes Neurological: no tremor with outstretched  hands Diabetic Foot Exam -  Simple   Simple Foot Form Diabetic Foot exam was performed with the following findings: Yes 09/11/2022  1:03 PM  Visual Inspection No deformities, no ulcerations, no other skin breakdown bilaterally: Yes Sensation Testing Intact to touch and monofilament testing bilaterally: Yes Pulse Check Posterior Tibialis and Dorsalis pulse intact bilaterally: Yes Comments Mild pitting edema bilaterally     ASSESSMENT: 1. DM2, insulin-independent, uncontrolled, with complications - CAD, s/p AMI 06/2013 - Dr Acie Fredrickson - CHF - CKD - PN - DR - Dr. Jalene Mullet ([facility]) - on IO injections >> improving  2. Goiter - No neck compression symptoms  - 10/19/2014: thyroid ultrasound: Right thyroid lobe: 4.0 x 1.5 x 1.6 cm. Heterogeneous parenchyma with multiple small cysts identified. The largest measures approximately 0.5 x 0.3 x 0.5 cm. All of the small cysts in the right lobe appears simple and benign.  Left thyroid lobe: 4.5 x 1.4 x 2.0 cm. Heterogeneous parenchyma with multiple cysts. The largest measures approximately 1.0 x 0.4 x 0.5 cm. 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 Similar smaller cyst measures 0.8 cm in greatest diameter. There is a small solid nodule measuring 0.7 x 0.4 x 0.6 cm.  Isthmus Thickness: 0.4 cm.  No nodules visualized.  Lymphadenopathy: None visualized.            Multicystic thyroid.  3. HL  PLAN:  1. Patient with longstanding, uncontrolled, type 2 diabetes, on sulfonylurea only, after she came off metformin due to improved diet.  She is switched to a more plant-based diet with reduced processed foods and sweets.  However, she then relaxed her diet and sugars increased.  She refused insulin.  At last visit, sugars were improved, mostly at goal, with occasional mild hyperglycemic exceptions.  HbA1c was lower, at 7.1%.  We did not change the regimen but we discussed about possibly taking a double dose of glipizide if she  planned to eat more in a particular day. - at today's visit, all her blood sugars are at goal.  She has been Mediterranean diet for 2 weeks and it appears this works well for her.  I strongly encouraged her to continue.  She is rarely using the 2 doses of glip will continue with the current regimen for now.  She only rarely uses the 10 mg of glipizide. -I advised her to: Patient Instructions  Please continue: - Glipizide ER 5-10 mg before breakfast  Please return in 4-6 months with your sugar log.  - we checked her HbA1c: 6.9% (lower, excellent) - advised to check sugars at different times of the day - 1x a day, rotating check times - advised for yearly eye exams >> she is UTD - return to clinic in 4-6 months   2. Goiter -No neck compression symptoms -On the latest thyroid ultrasound, she only had a small thyroid cyst -Latest TSH was normal: Lab Results  Component Value Date   TSH 1.51 07/17/2021  -No follow-up is necessary for now  3. HL -Reviewed her lipid panel from 12/2021: Fractions at goal with the exception of the LDL which is higher than 55, or goal for her due to cardiovascular disease. Lab Results  Component Value Date   CHOL 129 12/16/2021   HDL 49 12/16/2021   LDLCALC 65 12/16/2021   TRIG 78 12/16/2021   CHOLHDL 2.6 12/16/2021  -She is on Crestor 5 mg daily without side effects  Philemon Kingdom, MD PhD [facility]

## 2022-09-13 MED ORDER — HYDROCODONE BIT-HOMATROP MBR 5-1.5 MG/5ML PO SOLN: 5.0000 mL | Freq: Four times a day (QID) | ORAL | 0 refills | Status: DC | PRN

## 2022-09-13 NOTE — Telephone Encounter (Signed)
Okay. Thank you.

## 2022-09-14 DIAGNOSIS — E113512 Type 2 diabetes mellitus with proliferative diabetic retinopathy with macular edema, left eye: Secondary | ICD-10-CM | POA: Diagnosis not present

## 2022-09-23 ENCOUNTER — Telehealth: Payer: Self-pay

## 2022-09-23 NOTE — Telephone Encounter (Signed)
Outgoing call made by renal coordinator for [employer] program check-in. Patient shared she has been sick with a chronic cough for over a month. Though rx was called in, she never received the prescription. She added once calling back, she was able to get rx sent to local pharmacy (09/13/22) but insurance will not cover cost. Currently, pending feedback from [place] team as to if there are resources to assist as patient shared she has been taking Halls daily and drinking green tea to offset symptoms.  Made [facility] meals referral for Heart Friendly diet as well. Patient has had a gout flare as well as struggles with her potassium. She will receive 4 weeks of meals once request is processed.   Ina Homes Renal Coordinator [place] [phone]

## 2022-10-08 DIAGNOSIS — M109 Gout, unspecified: Secondary | ICD-10-CM | POA: Diagnosis not present

## 2022-10-08 DIAGNOSIS — I129 Hypertensive chronic kidney disease with stage 1 through stage 4 chronic kidney disease, or unspecified chronic kidney disease: Secondary | ICD-10-CM | POA: Diagnosis not present

## 2022-10-08 DIAGNOSIS — I5189 Other ill-defined heart diseases: Secondary | ICD-10-CM | POA: Diagnosis not present

## 2022-10-08 DIAGNOSIS — D631 Anemia in chronic kidney disease: Secondary | ICD-10-CM | POA: Diagnosis not present

## 2022-10-08 DIAGNOSIS — N2581 Secondary hyperparathyroidism of renal origin: Secondary | ICD-10-CM | POA: Diagnosis not present

## 2022-10-08 DIAGNOSIS — E1122 Type 2 diabetes mellitus with diabetic chronic kidney disease: Secondary | ICD-10-CM | POA: Diagnosis not present

## 2022-10-08 DIAGNOSIS — N189 Chronic kidney disease, unspecified: Secondary | ICD-10-CM | POA: Diagnosis not present

## 2022-10-08 DIAGNOSIS — N184 Chronic kidney disease, stage 4 (severe): Secondary | ICD-10-CM | POA: Diagnosis not present

## 2022-10-08 DIAGNOSIS — I82409 Acute embolism and thrombosis of unspecified deep veins of unspecified lower extremity: Secondary | ICD-10-CM | POA: Diagnosis not present

## 2022-10-09 LAB — LAB REPORT - SCANNED
Albumin, Urine POC: 16.9
Creatinine, POC: 30 mg/dL
EGFR: 21
Microalb Creat Ratio: 56
Protein/Creatinine Ratio: 143

## 2022-10-22 ENCOUNTER — Other Ambulatory Visit: Payer: Self-pay | Admitting: Nephrology

## 2022-10-22 ENCOUNTER — Encounter: Payer: Self-pay | Admitting: Nephrology

## 2022-10-22 DIAGNOSIS — D631 Anemia in chronic kidney disease: Secondary | ICD-10-CM

## 2022-10-22 DIAGNOSIS — I824Y9 Acute embolism and thrombosis of unspecified deep veins of unspecified proximal lower extremity: Secondary | ICD-10-CM

## 2022-10-22 DIAGNOSIS — I5189 Other ill-defined heart diseases: Secondary | ICD-10-CM

## 2022-10-22 DIAGNOSIS — M109 Gout, unspecified: Secondary | ICD-10-CM

## 2022-10-22 DIAGNOSIS — N184 Chronic kidney disease, stage 4 (severe): Secondary | ICD-10-CM

## 2022-10-26 ENCOUNTER — Encounter: Payer: Self-pay | Admitting: Internal Medicine

## 2022-10-26 ENCOUNTER — Ambulatory Visit (INDEPENDENT_AMBULATORY_CARE_PROVIDER_SITE_OTHER): Payer: 59 | Admitting: Internal Medicine

## 2022-10-26 VITALS — BP 140/78 | HR 76 | Temp 98.3°F | Ht 62.0 in | Wt 189.0 lb

## 2022-10-26 DIAGNOSIS — J40 Bronchitis, not specified as acute or chronic: Secondary | ICD-10-CM | POA: Insufficient documentation

## 2022-10-26 DIAGNOSIS — R739 Hyperglycemia, unspecified: Secondary | ICD-10-CM | POA: Diagnosis not present

## 2022-10-26 DIAGNOSIS — J449 Chronic obstructive pulmonary disease, unspecified: Secondary | ICD-10-CM

## 2022-10-26 MED ORDER — PROMETHAZINE-DM 6.25-15 MG/5ML PO SYRP: 5.0000 mL | ORAL_SOLUTION | Freq: Four times a day (QID) | ORAL | 0 refills | Status: DC | PRN

## 2022-10-26 MED ORDER — METHYLPREDNISOLONE 4 MG PO TBPK: ORAL_TABLET | ORAL | 0 refills | Status: DC

## 2022-10-26 MED ORDER — AZITHROMYCIN 250 MG PO TABS: ORAL_TABLET | ORAL | 0 refills | Status: DC

## 2022-10-26 NOTE — Progress Notes (Signed)
Subjective:  Patient ID: [name], female    DOB: 1951/04/10  Age: 72 y.o. MRN: [id number]  CC: Follow-up (Pt wants to discuss about cough syrup the price was to much for patient to pay for)   HPI [name] presents for cough - white phlegm Cough syrup was too $$$   Outpatient Medications Prior to Visit  Medication Sig Dispense Refill   acetaminophen (TYLENOL) 325 MG tablet Take 325 mg by mouth every 6 (six) hours as needed.     allopurinol (ZYLOPRIM) 100 MG tablet Take 50 mg by mouth daily.     amLODipine (NORVASC) 10 MG tablet TAKE ONE-HALF TABLET BY MOUTH  DAILY 50 tablet 1   Ascorbic Acid (VITAMIN C) 1000 MG tablet Take 1,000 mg by mouth daily.     calcium carbonate (OS-CAL) 600 MG TABS tablet Take 600 mg by mouth 2 (two) times daily with a meal.     Cholecalciferol (VITAMIN D3) 50 MCG (2000 UT) capsule Take 1 capsule (2,000 Units total) by mouth daily. 100 capsule 3   ELIQUIS 5 MG TABS tablet TAKE 1 TABLET BY MOUTH TWICE A DAY 60 tablet 5   ferrous sulfate 325 (65 FE) MG tablet Take 325 mg by mouth 2 (two) times daily with a meal.     glipiZIDE (GLUCOTROL XL) 5 MG 24 hr tablet TAKE 1 TABLET BY MOUTH DAILY  WITH BREAKFAST 90 tablet 3   hydrALAZINE (APRESOLINE) 25 MG tablet TAKE 1 TABLET BY MOUTH THREE TIMES A DAY 270 tablet 3   lansoprazole (PREVACID) 15 MG capsule Take 1 capsule (15 mg total) by mouth daily at 12 noon. 90 capsule 3   nebivolol (BYSTOLIC) 10 MG tablet TAKE 1 TABLET BY MOUTH EVERY DAY 90 tablet 3   ondansetron (ZOFRAN) 4 MG tablet Take 1 tablet (4 mg total) by mouth every 8 (eight) hours as needed for nausea or vomiting. 20 tablet 0   ONETOUCH ULTRA test strip USE TO TEST BLOOD SUGAR  TWICE DAILY AS DIRECTED 200 strip 2   potassium chloride SA (KLOR-CON M) 20 MEQ tablet TAKE 1 TABLET BY MOUTH DAILY 100 tablet 1   rosuvastatin (CRESTOR) 5 MG tablet TAKE 1 TABLET (5 MG TOTAL) BY MOUTH DAILY. TAKE 1 TABLET BY MOUTH DAILY AT 6PM 90 tablet 2    torsemide (DEMADEX) 10 MG tablet Take 2.5 tablets (25 mg total) by mouth daily. (Patient taking differently: Take 25 mg by mouth daily. Pt taking 25 mg alternating with 50 mg every other day.) 225 tablet 3   triamcinolone cream (KENALOG) 0.1 % Apply 1 Application topically 3 (three) times daily. On rash 160 g 3   vitamin B-12 (CYANOCOBALAMIN) 1000 MCG tablet Take 1,000 mcg by mouth daily.     cefdinir (OMNICEF) 300 MG capsule Take 1 capsule (300 mg total) by mouth 2 (two) times daily. 20 capsule 0   HYDROcodone bit-homatropine (HYCODAN) 5-1.5 MG/5ML syrup Take 5 mLs by mouth every 6 (six) hours as needed for cough. 240 mL 0   No facility-administered medications prior to visit.    ROS: Review of Systems  Objective:  BP (!) 140/78 (BP Location: Left Arm, Patient Position: Sitting, Cuff Size: Normal)   Pulse 76   Temp 98.3 F (36.8 C) (Oral)   Ht  (1.575 m)   Wt 189 lb (85.7 kg)   LMP  (LMP Unknown)   SpO2 97%   BMI 34.57 kg/m   BP Readings from Last 3 Encounters:  10/26/22 (!) 140/78  09/11/22 138/86  08/27/22 130/68    Wt Readings from Last 3 Encounters:  10/26/22 189 lb (85.7 kg)  09/11/22 188 lb 9.6 oz (85.5 kg)  08/27/22 187 lb (84.8 kg)    Physical Exam  Lab Results  Component Value Date   WBC 5.4 06/10/2022   HGB 9.5 (A) 06/10/2022   HCT 30 (A) 06/10/2022   PLT 161 06/10/2022   GLUCOSE 136 (H) 12/16/2021   CHOL 129 12/16/2021   TRIG 78 12/16/2021   HDL 49 12/16/2021   LDLCALC 65 12/16/2021   ALT 11 12/16/2021   AST 14 07/17/2021   NA 139 06/10/2022   K 4.4 06/10/2022   CL 104 06/10/2022   CREATININE 3.0 (A) 06/10/2022   BUN 83 (A) 06/10/2022   CO2 22 06/10/2022   TSH 1.51 07/17/2021   INR 1.1 06/17/2020   HGBA1C 6.9 (A) 09/11/2022   MICROALBUR 13.9 (H) 10/13/2007    No results found.  Assessment & Plan:   Problem List Items Addressed This Visit       Respiratory   Bronchitis    Not better Refusing MDIs - "bad taste" Unable to use PO  Albuterol - palpitations Medrol pack and Zpac Prometh cough syr      COPD mixed type - Primary    Not better Refusing MDIs - "bad taste" Unable to use PO Albuterol - palpitations Medrol pack and Zpac      Relevant Medications   methylPREDNISolone (MEDROL DOSEPAK) 4 MG TBPK tablet   azithromycin (ZITHROMAX Z-PAK) 250 MG tablet   promethazine-dextromethorphan (PROMETHAZINE-DM) 6.25-15 MG/5ML syrup     Other   Hyperglycemia    [place] glucose closely while on steroids         Meds ordered this encounter  Medications   methylPREDNISolone (MEDROL DOSEPAK) 4 MG TBPK tablet    Sig: As directed    Dispense:  21 tablet    Refill:  0   azithromycin (ZITHROMAX Z-PAK) 250 MG tablet    Sig: As directed    Dispense:  6 tablet    Refill:  0   promethazine-dextromethorphan (PROMETHAZINE-DM) 6.25-15 MG/5ML syrup    Sig: Take 5 mLs by mouth 4 (four) times daily as needed for cough.    Dispense:  240 mL    Refill:  0      Follow-up: No follow-ups on file.  Sonda Primes, MD

## 2022-10-26 NOTE — Assessment & Plan Note (Addendum)
Not better Refusing MDIs - "bad taste" Unable to use PO Albuterol - palpitations Medrol pack and Zpac Prometh cough syr

## 2022-10-26 NOTE — Assessment & Plan Note (Signed)
[place] glucose closely while on steroids

## 2022-10-26 NOTE — Assessment & Plan Note (Signed)
Not better Refusing MDIs - "bad taste" Unable to use PO Albuterol - palpitations Medrol pack and Zpac

## 2022-10-27 ENCOUNTER — Ambulatory Visit (HOSPITAL_COMMUNITY)
Admission: RE | Admit: 2022-10-27 | Discharge: 2022-10-27 | Disposition: A | Discharge status: Home / Self Care | Payer: 59 | Source: Ambulatory Visit | Attending: Nephrology | Admitting: Nephrology

## 2022-10-27 DIAGNOSIS — N189 Chronic kidney disease, unspecified: Secondary | ICD-10-CM | POA: Insufficient documentation

## 2022-10-27 DIAGNOSIS — D631 Anemia in chronic kidney disease: Secondary | ICD-10-CM | POA: Diagnosis not present

## 2022-10-27 DIAGNOSIS — N184 Chronic kidney disease, stage 4 (severe): Secondary | ICD-10-CM

## 2022-10-27 DIAGNOSIS — M109 Gout, unspecified: Secondary | ICD-10-CM | POA: Diagnosis not present

## 2022-10-27 DIAGNOSIS — I5189 Other ill-defined heart diseases: Secondary | ICD-10-CM | POA: Diagnosis not present

## 2022-10-27 DIAGNOSIS — I824Y9 Acute embolism and thrombosis of unspecified deep veins of unspecified proximal lower extremity: Secondary | ICD-10-CM | POA: Diagnosis not present

## 2022-11-03 ENCOUNTER — Encounter: Payer: Self-pay | Admitting: Internal Medicine

## 2022-12-01 ENCOUNTER — Other Ambulatory Visit: Payer: Self-pay | Admitting: Internal Medicine

## 2022-12-13 ENCOUNTER — Other Ambulatory Visit: Payer: Self-pay | Admitting: Internal Medicine

## 2023-01-13 DIAGNOSIS — H3582 Retinal ischemia: Secondary | ICD-10-CM | POA: Diagnosis not present

## 2023-01-13 DIAGNOSIS — E113513 Type 2 diabetes mellitus with proliferative diabetic retinopathy with macular edema, bilateral: Secondary | ICD-10-CM | POA: Diagnosis not present

## 2023-01-13 DIAGNOSIS — H25813 Combined forms of age-related cataract, bilateral: Secondary | ICD-10-CM | POA: Diagnosis not present

## 2023-01-13 DIAGNOSIS — H35033 Hypertensive retinopathy, bilateral: Secondary | ICD-10-CM | POA: Diagnosis not present

## 2023-01-13 DIAGNOSIS — H31093 Other chorioretinal scars, bilateral: Secondary | ICD-10-CM | POA: Diagnosis not present

## 2023-01-13 LAB — HM DIABETES EYE EXAM

## 2023-01-22 ENCOUNTER — Encounter: Payer: Self-pay | Admitting: Pharmacist

## 2023-01-22 DIAGNOSIS — G729 Myopathy, unspecified: Secondary | ICD-10-CM

## 2023-01-22 NOTE — Progress Notes (Signed)
[facility]) [facility] Quality Pharmacy Team Statin Quality Measure Assessment  01/22/2023  Krishika Bugge [birth year] [id number]  Per review of chart and payor information, Ms. Nagele has a diagnosis of diabetes but is not currently filling a statin prescription.  This places patient into the Statin Use In Patients with Diabetes (SUPD) measure for CMS.    Patient has documented trials of statins with reported muscle aches, cramps and pain, but no corresponding CPT codes that would exclude patient from SUPD measure.  Please consider evaluating her statin intolerance and code accordingly to remove her from the CMS measure for 2024.  Please consider ONE of the following recommendations:    Initiate moderate intensity          statin with reduced frequency if prior          statin intolerance 1x weekly, #13, 3 refills   2x weekly, #26, 3 refills   3x weekly, #39, 3 refills    Code for past statin intolerance or  other exclusions (required annually)  Provider Requirements: Associate code during an office visit or telehealth encounter  Drug Induced Myopathy G72.0   Myopathy, unspecified G72.9   Myositis, unspecified M60.9   Rhabdomyolysis M62.82   Cirrhosis of liver K74.69   Prediabetes R73.03   PCOS E28.2   Thank you for allowing [facility] pharmacy to be a part of this patient's care.  Dellie Burns, PharmD Clinical Pharmacist [place]  Direct Dial: [phone]

## 2023-01-25 ENCOUNTER — Encounter: Payer: Self-pay | Admitting: Internal Medicine

## 2023-01-25 ENCOUNTER — Ambulatory Visit (INDEPENDENT_AMBULATORY_CARE_PROVIDER_SITE_OTHER): Payer: 59 | Admitting: Internal Medicine

## 2023-01-25 VITALS — BP 120/60 | HR 73 | Temp 98.4°F | Ht 62.0 in | Wt 183.0 lb

## 2023-01-25 DIAGNOSIS — I2510 Atherosclerotic heart disease of native coronary artery without angina pectoris: Secondary | ICD-10-CM | POA: Diagnosis not present

## 2023-01-25 DIAGNOSIS — N184 Chronic kidney disease, stage 4 (severe): Secondary | ICD-10-CM | POA: Diagnosis not present

## 2023-01-25 DIAGNOSIS — E113593 Type 2 diabetes mellitus with proliferative diabetic retinopathy without macular edema, bilateral: Secondary | ICD-10-CM

## 2023-01-25 DIAGNOSIS — R600 Localized edema: Secondary | ICD-10-CM

## 2023-01-25 DIAGNOSIS — L84 Corns and callosities: Secondary | ICD-10-CM | POA: Insufficient documentation

## 2023-01-25 DIAGNOSIS — E11319 Type 2 diabetes mellitus with unspecified diabetic retinopathy without macular edema: Secondary | ICD-10-CM | POA: Insufficient documentation

## 2023-01-25 DIAGNOSIS — I82412 Acute embolism and thrombosis of left femoral vein: Secondary | ICD-10-CM

## 2023-01-25 DIAGNOSIS — E1122 Type 2 diabetes mellitus with diabetic chronic kidney disease: Secondary | ICD-10-CM | POA: Diagnosis not present

## 2023-01-25 MED ORDER — ASPIRIN 81 MG PO TBEC: 162.0000 mg | DELAYED_RELEASE_TABLET | Freq: Every day | ORAL | Status: AC

## 2023-01-25 NOTE — Assessment & Plan Note (Signed)
Marnesha can stop Eliquis  Take ASA

## 2023-01-25 NOTE — Assessment & Plan Note (Signed)
On Aspirin, Crestor Nicollette can stop Eliquis

## 2023-01-25 NOTE — Assessment & Plan Note (Addendum)
S/p L leg injuruy 1 mo ago - fell off the counter and hit a stove door w/her L lat leg. LLE aw/a line of demarcation between swelling and no swelling laterally. Doppler US - no DVT Use Compression socks

## 2023-01-25 NOTE — Assessment & Plan Note (Signed)
F/u w/Dr Upton 

## 2023-01-25 NOTE — Assessment & Plan Note (Signed)
Dr Allena Katz is planning laser surgery on B eyes soon

## 2023-01-25 NOTE — Progress Notes (Signed)
Subjective:  Patient ID: [name], female    DOB: 1951/06/17  Age: 72 y.o. MRN: [id number]  CC: Foot Injury (Pt states that she has callosus on her foot the size of a mini golf ball)   HPI [name] presents for DM, HTN, DVT Dr Allena Katz is planning laser surgery on B eyes C/o callus L foot  Outpatient Medications Prior to Visit  Medication Sig Dispense Refill   acetaminophen (TYLENOL) 325 MG tablet Take 325 mg by mouth every 6 (six) hours as needed.     allopurinol (ZYLOPRIM) 100 MG tablet Take 50 mg by mouth daily.     amLODipine (NORVASC) 10 MG tablet Take 0.5 tablets (5 mg total) by mouth daily. Annual appt due in AUG must see provider for future refills 50 tablet 0   Ascorbic Acid (VITAMIN C) 1000 MG tablet Take 1,000 mg by mouth daily.     azithromycin (ZITHROMAX Z-PAK) 250 MG tablet As directed 6 tablet 0   calcium carbonate (OS-CAL) 600 MG TABS tablet Take 600 mg by mouth 2 (two) times daily with a meal.     Cholecalciferol (VITAMIN D3) 50 MCG (2000 UT) capsule Take 1 capsule (2,000 Units total) by mouth daily. 100 capsule 3   ferrous sulfate 325 (65 FE) MG tablet Take 325 mg by mouth 2 (two) times daily with a meal.     glipiZIDE (GLUCOTROL XL) 5 MG 24 hr tablet TAKE 1 TABLET BY MOUTH DAILY  WITH BREAKFAST 100 tablet 2   glucose blood (ONETOUCH ULTRA) test strip USE TO TEST BLOOD SUGAR TWICE  DAILY AS DIRECTED 200 strip 0   hydrALAZINE (APRESOLINE) 25 MG tablet TAKE 1 TABLET BY MOUTH THREE TIMES A DAY 270 tablet 3   lansoprazole (PREVACID) 15 MG capsule Take 1 capsule (15 mg total) by mouth daily at 12 noon. 90 capsule 3   methylPREDNISolone (MEDROL DOSEPAK) 4 MG TBPK tablet As directed 21 tablet 0   nebivolol (BYSTOLIC) 10 MG tablet TAKE 1 TABLET BY MOUTH EVERY DAY 90 tablet 3   ondansetron (ZOFRAN) 4 MG tablet Take 1 tablet (4 mg total) by mouth every 8 (eight) hours as needed for nausea or vomiting. 20 tablet 0   potassium chloride SA (KLOR-CON M) 20  MEQ tablet Take 1 tablet (20 mEq total) by mouth daily. Annual appt due in AUG must see provider for future refills 100 tablet 0   promethazine-dextromethorphan (PROMETHAZINE-DM) 6.25-15 MG/5ML syrup Take 5 mLs by mouth 4 (four) times daily as needed for cough. 240 mL 0   rosuvastatin (CRESTOR) 5 MG tablet TAKE 1 TABLET (5 MG TOTAL) BY MOUTH DAILY. TAKE 1 TABLET BY MOUTH DAILY AT 6PM 90 tablet 2   torsemide (DEMADEX) 10 MG tablet Take 2.5 tablets (25 mg total) by mouth daily. (Patient taking differently: Take 25 mg by mouth daily. Pt taking 25 mg alternating with 50 mg every other day.) 225 tablet 3   triamcinolone cream (KENALOG) 0.1 % Apply 1 Application topically 3 (three) times daily. On rash 160 g 3   vitamin B-12 (CYANOCOBALAMIN) 1000 MCG tablet Take 1,000 mcg by mouth daily.     ELIQUIS 5 MG TABS tablet TAKE 1 TABLET BY MOUTH TWICE A DAY 60 tablet 5   No facility-administered medications prior to visit.    ROS: Review of Systems  Constitutional:  Negative for activity change, appetite change, chills, fatigue and unexpected weight change.  HENT:  Negative for congestion, mouth sores and sinus pressure.  Eyes:  Negative for visual disturbance.  Respiratory:  Negative for cough and chest tightness.   Cardiovascular:  Positive for leg swelling.  Gastrointestinal:  Negative for abdominal pain and nausea.  Genitourinary:  Negative for difficulty urinating, frequency and vaginal pain.  Musculoskeletal:  Negative for back pain and gait problem.  Skin:  Negative for pallor and rash.  Neurological:  Negative for dizziness, tremors, weakness, numbness and headaches.  Hematological:  Bruises/bleeds easily.  Psychiatric/Behavioral:  Negative for confusion and sleep disturbance.     Objective:  BP 120/60 (BP Location: Left Arm, Patient Position: Sitting, Cuff Size: Normal)   Pulse 73   Temp 98.4 F (36.9 C) (Oral)   Ht 5\' 2"  (1.575 m)   Wt 183 lb (83 kg)   LMP  (LMP Unknown)   SpO2 97%    BMI 33.47 kg/m   BP Readings from Last 3 Encounters:  01/25/23 120/60  10/26/22 (!) 140/78  09/11/22 138/86    Wt Readings from Last 3 Encounters:  01/25/23 183 lb (83 kg)  10/26/22 189 lb (85.7 kg)  09/11/22 188 lb 9.6 oz (85.5 kg)    Physical Exam Constitutional:      General: She is not in acute distress.    Appearance: She is well-developed. She is obese.  HENT:     Head: Normocephalic.     Right Ear: External ear normal.     Left Ear: External ear normal.     Nose: Nose normal.  Eyes:     General:        Right eye: No discharge.        Left eye: No discharge.     Conjunctiva/sclera: Conjunctivae normal.     Pupils: Pupils are equal, round, and reactive to light.  Neck:     Thyroid: No thyromegaly.     Vascular: No JVD.     Trachea: No tracheal deviation.  Cardiovascular:     Rate and Rhythm: Normal rate and regular rhythm.     Heart sounds: Normal heart sounds.  Pulmonary:     Effort: No respiratory distress.     Breath sounds: No stridor. No wheezing.  Abdominal:     General: Bowel sounds are normal. There is no distension.     Palpations: Abdomen is soft. There is no mass.     Tenderness: There is no abdominal tenderness. There is no guarding or rebound.  Musculoskeletal:        General: No tenderness.     Cervical back: Normal range of motion and neck supple. No rigidity.     Right lower leg: Edema present.     Left lower leg: Edema present.  Lymphadenopathy:     Cervical: No cervical adenopathy.  Skin:    Findings: No erythema or rash.  Neurological:     Cranial Nerves: No cranial nerve deficit.     Motor: No abnormal muscle tone.     Coordination: Coordination normal.     Gait: Gait abnormal.     Deep Tendon Reflexes: Reflexes normal.  Psychiatric:        Behavior: Behavior normal.        Thought Content: Thought content normal.        Judgment: Judgment normal.    L lat foot calluses x2   Procedure:    foot callus paring/cutting   Indication:    foot callus x2, painful  Consent: verbal  Risks and benefits were explained to the patient. Skin was cleaned with alcohol.  I removed a large callus carefully with a round blade. Skin remained intact. Pain is better. Tolerated well. Complications: none. Bandaid applied    Lab Results  Component Value Date   WBC 5.4 06/10/2022   HGB 9.5 (A) 06/10/2022   HCT 30 (A) 06/10/2022   PLT 161 06/10/2022   GLUCOSE 136 (H) 12/16/2021   CHOL 129 12/16/2021   TRIG 78 12/16/2021   HDL 49 12/16/2021   LDLCALC 65 12/16/2021   ALT 11 12/16/2021   AST 14 07/17/2021   NA 139 06/10/2022   K 4.4 06/10/2022   CL 104 06/10/2022   CREATININE 3.0 (A) 06/10/2022   BUN 83 (A) 06/10/2022   CO2 22 06/10/2022   TSH 1.51 07/17/2021   INR 1.1 06/17/2020   HGBA1C 6.9 (A) 09/11/2022   MICROALBUR 13.9 (H) 10/13/2007    VAS [place] LOWER EXTREMITY VENOUS (DVT)  Result Date: 10/27/2022  Lower Venous DVT Study Patient Name:  KLOVER PRIESTLY  Date of Exam:   10/27/2022 Medical Rec #: [id number]               Accession #:    [id number] Date of Birth: 1951/06/26                Patient Gender: F Patient Age:   49 years Exam Location:  [facility] Procedure:      VAS [place] LOWER EXTREMITY VENOUS (DVT) Referring Phys: Lanora Manis U[place] --------------------------------------------------------------------------------  Indications: 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 Other Indications: Follow up for anticoagulation management. Anticoagulation: Eliquis. Performing Technologist: Thereasa Parkin RVT  Examination Guidelines: A complete evaluation includes B-mode imaging, spectral Doppler, color Doppler, and power Doppler as needed of all accessible portions of each vessel. Bilateral testing is considered an integral part of a complete examination. Limited examinations for reoccurring indications may be performed as noted. The reflux portion of the exam is performed with the patient in reverse  Trendelenburg.  +--------+---------------+---------+-----------+----------------+-------------+ LEFT    CompressibilityPhasicitySpontaneityProperties      Thrombus                                                                 Aging         +--------+---------------+---------+-----------+----------------+-------------+ CFV     Full           Yes      Yes                                      +--------+---------------+---------+-----------+----------------+-------------+ SFJ     Full                    Yes                                      +--------+---------------+---------+-----------+----------------+-------------+ FV Prox Full           Yes      Yes                                      +--------+---------------+---------+-----------+----------------+-------------+  FV Mid  Partial        Yes      Yes        brightly        Chronic                                                  echogenic                     +--------+---------------+---------+-----------+----------------+-------------+ FV      Full           Yes      Yes                                      Distal                                                                   +--------+---------------+---------+-----------+----------------+-------------+ POP     Full           Yes      Yes                                      +--------+---------------+---------+-----------+----------------+-------------+ PTV     Full                    Yes                                      +--------+---------------+---------+-----------+----------------+-------------+ PERO    Full                    Yes                                      +--------+---------------+---------+-----------+----------------+-------------+ GSV     Full           Yes                                               +--------+---------------+---------+-----------+----------------+-------------+  Summary:  RIGHT: - No evidence of common femoral vein obstruction.  LEFT: - Focal calcified chronic DVT in the mid FV. No change since previous exam on 11/27/21.  *See table(s) above for measurements and observations. Electronically signed by Sherald Hess MD on 10/27/2022 at 3:35:30 PM.    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 LLE aw/a line of demarcation between swelling and no swelling laterally. Doppler US - no DVT Use Compression socks      DVT femoral (deep venous thrombosis) with thrombophlebitis, left (HCC)    Sharlot can stop Eliquis  Take ASA      Relevant Medications   aspirin EC 81 MG tablet   Diabetic retinopathy (HCC) - Primary    Dr Allena Katz is planning laser surgery on B eyes soon      Relevant Medications   aspirin EC 81 MG tablet   Callus of foot      Meds ordered this encounter  Medications   aspirin EC 81 MG tablet    Sig: Take 2 tablets (162 mg total) by mouth daily.      Follow-up: Return in about 3 months (around 04/27/2023) for a follow-up visit.  Sonda Primes, MD

## 2023-01-31 ENCOUNTER — Other Ambulatory Visit: Payer: Self-pay | Admitting: Cardiovascular Disease

## 2023-02-04 DIAGNOSIS — E113512 Type 2 diabetes mellitus with proliferative diabetic retinopathy with macular edema, left eye: Secondary | ICD-10-CM | POA: Diagnosis not present

## 2023-02-10 ENCOUNTER — Other Ambulatory Visit: Payer: Self-pay | Admitting: Internal Medicine

## 2023-02-12 ENCOUNTER — Emergency Department (HOSPITAL_COMMUNITY)
Admission: EM | Admit: 2023-02-12 | Discharge: 2023-02-12 | Disposition: A | Discharge status: Home / Self Care | Payer: 59 | Attending: Emergency Medicine | Admitting: Emergency Medicine

## 2023-02-12 ENCOUNTER — Other Ambulatory Visit: Payer: Self-pay

## 2023-02-12 ENCOUNTER — Emergency Department (HOSPITAL_COMMUNITY): Payer: 59

## 2023-02-12 DIAGNOSIS — N184 Chronic kidney disease, stage 4 (severe): Secondary | ICD-10-CM | POA: Insufficient documentation

## 2023-02-12 DIAGNOSIS — Z7982 Long term (current) use of aspirin: Secondary | ICD-10-CM | POA: Diagnosis not present

## 2023-02-12 DIAGNOSIS — J449 Chronic obstructive pulmonary disease, unspecified: Secondary | ICD-10-CM | POA: Insufficient documentation

## 2023-02-12 DIAGNOSIS — R079 Chest pain, unspecified: Secondary | ICD-10-CM | POA: Diagnosis not present

## 2023-02-12 DIAGNOSIS — R0789 Other chest pain: Secondary | ICD-10-CM

## 2023-02-12 DIAGNOSIS — R110 Nausea: Secondary | ICD-10-CM | POA: Diagnosis not present

## 2023-02-12 DIAGNOSIS — M549 Dorsalgia, unspecified: Secondary | ICD-10-CM | POA: Diagnosis not present

## 2023-02-12 DIAGNOSIS — I509 Heart failure, unspecified: Secondary | ICD-10-CM | POA: Diagnosis not present

## 2023-02-12 DIAGNOSIS — R600 Localized edema: Secondary | ICD-10-CM | POA: Insufficient documentation

## 2023-02-12 DIAGNOSIS — J439 Emphysema, unspecified: Secondary | ICD-10-CM | POA: Diagnosis not present

## 2023-02-12 LAB — BASIC METABOLIC PANEL
Anion gap: 13 (ref 5–15)
BUN: 89 mg/dL — ABNORMAL HIGH (ref 8–23)
CO2: 18 mmol/L — ABNORMAL LOW (ref 22–32)
Calcium: 9.5 mg/dL (ref 8.9–10.3)
Chloride: 108 mmol/L (ref 98–111)
Creatinine, Ser: 2.88 mg/dL — ABNORMAL HIGH (ref 0.44–1.00)
GFR, Estimated: 17 mL/min — ABNORMAL LOW (ref >60)
Glucose, Bld: 127 mg/dL — ABNORMAL HIGH (ref 70–99)
Potassium: 4 mmol/L (ref 3.5–5.1)
Sodium: 139 mmol/L (ref 135–145)

## 2023-02-12 LAB — CBC
HCT: 33 % — ABNORMAL LOW (ref 36.0–46.0)
Hemoglobin: 10 g/dL — ABNORMAL LOW (ref 12.0–15.0)
MCH: 29.4 pg (ref 26.0–34.0)
MCHC: 30.3 g/dL (ref 30.0–36.0)
MCV: 97.1 fL (ref 80.0–100.0)
Platelets: 131 10*3/uL — ABNORMAL LOW (ref 150–400)
RBC: 3.4 MIL/uL — ABNORMAL LOW (ref 3.87–5.11)
RDW: 14 % (ref 11.5–15.5)
WBC: 6.4 10*3/uL (ref 4.0–10.5)
nRBC: 0 % (ref 0.0–0.2)

## 2023-02-12 LAB — TROPONIN I (HIGH SENSITIVITY)
Troponin I (High Sensitivity): 13 ng/L (ref <18)
Troponin I (High Sensitivity): 12 ng/L (ref <18)

## 2023-02-12 MED ORDER — LIDOCAINE 4 % EX PTCH: 1.0000 | MEDICATED_PATCH | CUTANEOUS | 0 refills | Status: AC

## 2023-02-12 MED ORDER — LIDOCAINE 5 % EX PTCH
1.0000 | MEDICATED_PATCH | Freq: Once | CUTANEOUS | Status: DC
  Administered 2023-02-12: 1 via TRANSDERMAL
  Filled 2023-02-12: qty 1

## 2023-02-12 MED ORDER — ASPIRIN 81 MG PO CHEW
324.0000 mg | CHEWABLE_TABLET | Freq: Once | ORAL | Status: AC
  Administered 2023-02-12: 324 mg via ORAL
  Filled 2023-02-12: qty 4

## 2023-02-12 NOTE — ED Notes (Signed)
Patient verbalizes understanding of discharge instructions. Opportunity for questioning and answers were provided. Armband removed by staff, pt discharged from ED. Pt taken to ED entrance via wheel chair.  

## 2023-02-12 NOTE — ED Triage Notes (Signed)
Pt. Stated, [name] had chest pain that goes to my back since Tuesday. My rt. Arm Hurts some.

## 2023-02-12 NOTE — Discharge Instructions (Addendum)
As discussed, your labs and imaging do not show any cardiac causes of your chest pain. Your imaging did show that your thyroid is enlarged. Please call your PCP and make an appointment with them for repeat imaging for reevaluation in 1 week.  Follow up with your cardiologist in 3-5 days for reevaluation of your chest pain. I have sent a prescription for Lidocaine patches to your pharmacy. Apply 1 patch a day to the area of pain. You can also apply heat or ice to the area for further relief.  Get help right away if: You feel sick to your stomach (nauseous) or you throw up (vomit). You feel sweaty or light-headed. You have a cough with mucus from your lungs (sputum) or you cough up blood. You are short of breath.

## 2023-02-12 NOTE — ED Provider Notes (Signed)
[place] EMERGENCY DEPARTMENT AT [facility] Provider Note   CSN: [id number] Arrival date & time: 02/12/23  1046     History  Chief Complaint  Patient presents with   Chest Pain   Back Pain    [name] is a 72 y.o. female with a history of congestive heart failure, stage IV kidney disease, COPD, and GERD who presents to the ED today with chest pain.  Patient reports pain to the right side of her chest for the past 4 days that radiates to her shoulder as well as down her right arm.  The pain is worse with movement and deep breaths.  Additionally, she reports nausea without vomiting.  She has taken Zofran without relief of symptoms.  Patient took Nitroglycerin the day the pain started and said it helped some.  She believes that the medication is expired, and has not taken it since.  At first, patient thought that her pain was due to gas but since it persisted she decided to come in for further evaluation.  No additional concerns or complaints at this time.    Home Medications Prior to Admission medications   Medication Sig Start Date End Date Taking? Authorizing Provider  acetaminophen (TYLENOL) 325 MG tablet Take 325 mg by mouth every 6 (six) hours as needed.   Yes [provider]  allopurinol (ZYLOPRIM) 100 MG tablet Take 50 mg by mouth daily. 05/08/21  Yes [provider]  amLODipine (NORVASC) 10 MG tablet TAKE ONE-HALF TABLET BY MOUTH  DAILY 02/10/23  Yes [name], Georgina Quint, MD  Ascorbic Acid (VITAMIN C) 1000 MG tablet Take 500 mg by mouth daily.   Yes [provider]  aspirin EC 81 MG tablet Take 2 tablets (162 mg total) by mouth daily. 01/25/23 01/25/24 Yes [name], Georgina Quint, MD  calcium carbonate (OS-CAL) 600 MG TABS tablet Take 600 mg by mouth 2 (two) times daily with a meal.   Yes [provider]  Cholecalciferol (VITAMIN D3) 50 MCG (2000 UT) capsule Take 1 capsule (2,000 Units total) by mouth daily. 09/21/19  Yes  [name], Georgina Quint, MD  ferrous sulfate 325 (65 FE) MG tablet Take 325 mg by mouth 2 (two) times daily with a meal.   Yes [provider]  glipiZIDE (GLUCOTROL XL) 5 MG 24 hr tablet TAKE 1 TABLET BY MOUTH DAILY  WITH BREAKFAST 12/14/22  Yes Carlus Pavlov, MD  hydrALAZINE (APRESOLINE) 25 MG tablet TAKE 1 TABLET BY MOUTH THREE TIMES A DAY 02/03/23  Yes Nahser, Deloris Ping, MD  lansoprazole (PREVACID) 15 MG capsule Take 1 capsule (15 mg total) by mouth daily at 12 noon. 01/10/19  Yes [name], Georgina Quint, MD  lidocaine (HM LIDOCAINE PATCH) 4 % Place 1 patch onto the skin daily for 14 days. 02/12/23 02/26/23 Yes Maxwell Marion, PA-C  nebivolol (BYSTOLIC) 10 MG tablet TAKE 1 TABLET BY MOUTH EVERY DAY 06/09/22  Yes [name], Georgina Quint, MD  ondansetron (ZOFRAN) 4 MG tablet Take 1 tablet (4 mg total) by mouth every 8 (eight) hours as needed for nausea or vomiting. 03/28/19  Yes [name], Georgina Quint, MD  potassium chloride SA (KLOR-CON M) 20 MEQ tablet Take 1 tablet (20 mEq total) by mouth daily. Annual appt due in Oct must see provider for future refills 02/10/23  Yes [name], Georgina Quint, MD  rosuvastatin (CRESTOR) 5 MG tablet TAKE 1 TABLET (5 MG TOTAL) BY MOUTH DAILY. TAKE 1 TABLET BY MOUTH DAILY AT 6PM Patient taking differently: Take 5  mg by mouth every other day. 05/26/22  Yes [name], Georgina Quint, MD  torsemide (DEMADEX) 10 MG tablet Take 2.5 tablets (25 mg total) by mouth daily. 12/17/21  Yes Nahser, Deloris Ping, MD  triamcinolone cream (KENALOG) 0.1 % Apply 1 Application topically 3 (three) times daily. On rash 08/27/22  Yes [name], Georgina Quint, MD  vitamin B-12 (CYANOCOBALAMIN) 1000 MCG tablet Take 1,000 mcg by mouth daily.   Yes [provider]  glucose blood (ONETOUCH ULTRA) test strip USE TO TEST BLOOD SUGAR TWICE  DAILY AS DIRECTED 12/02/22   [name], Georgina Quint, MD      Allergies    Tradjenta [linagliptin], Albuterol, Atorvastatin, Biobrane gloves large [wound dressings], Enalapril  maleate, Farxiga [dapagliflozin], Hydrochlorothiazide, Kenalog [triamcinolone acetonide], Levaquin [levofloxacin], Lisinopril, Metformin and related, Propoxyphene n-acetaminophen, Simvastatin, and Spironolactone    Review of Systems   Review of Systems  Cardiovascular:  Positive for chest pain.  Musculoskeletal:  Positive for back pain.  All other systems reviewed and are negative.   Physical Exam Updated Vital Signs BP (!) 149/90   Pulse 67   Temp 97.6 F (36.4 C) (Oral)   Resp 12   LMP  (LMP Unknown)   SpO2 100%  Physical Exam Vitals and nursing note reviewed.  Constitutional:      General: She is not in acute distress.    Appearance: Normal appearance.  HENT:     Head: Normocephalic and atraumatic.     Mouth/Throat:     Mouth: Mucous membranes are moist.  Eyes:     Conjunctiva/sclera: Conjunctivae normal.     Pupils: Pupils are equal, round, and reactive to light.  Cardiovascular:     Rate and Rhythm: Normal rate and regular rhythm.     Pulses: Normal pulses.     Heart sounds: Normal heart sounds.  Pulmonary:     Effort: Pulmonary effort is normal.     Breath sounds: Normal breath sounds.  Abdominal:     Palpations: Abdomen is soft.     Tenderness: There is no abdominal tenderness.  Musculoskeletal:     Right lower leg: Edema present.     Left lower leg: Edema present.  Skin:    General: Skin is warm and dry.     Findings: No rash.  Neurological:     General: No focal deficit present.     Mental Status: She is alert.     Sensory: No sensory deficit.     Motor: No weakness.  Psychiatric:        Mood and Affect: Mood normal.        Behavior: Behavior normal.     ED Results / Procedures / Treatments   Labs (all labs ordered are listed, but only abnormal results are displayed) Labs Reviewed  BASIC METABOLIC PANEL - Abnormal; Notable for the following components:      Result Value   CO2 18 (*)    Glucose, Bld 127 (*)    BUN 89 (*)    Creatinine, Ser  2.88 (*)    [name], Estimated 17 (*)    All other components within normal limits  CBC - Abnormal; Notable for the following components:   RBC 3.40 (*)    Hemoglobin 10.0 (*)    HCT 33.0 (*)    Platelets 131 (*)    All other components within normal limits  TROPONIN I (HIGH SENSITIVITY)  TROPONIN I (HIGH SENSITIVITY)    EKG EKG Interpretation Date/Time:  Friday February 12 2023 14:07:37 EDT  Ventricular Rate:  71 PR Interval:  196 QRS Duration:  85 QT Interval:  385 QTC Calculation: 419 R Axis:   10  Text Interpretation: Sinus rhythm no acute ST/T changes no significant change since earlier in the day Confirmed by Pricilla Loveless [phone]) on 02/12/2023 3:05:57 PM  Radiology CT CHEST WO CONTRAST  Result Date: 02/12/2023 CLINICAL DATA:  Chest and back pain EXAM: CT CHEST WITHOUT CONTRAST TECHNIQUE: Multidetector CT imaging of the chest was performed following the standard protocol without IV contrast. RADIATION DOSE REDUCTION: This exam was performed according to the departmental dose-optimization program which includes automated exposure control, adjustment of the mA and/or kV according to patient size and/or use of iterative reconstruction technique. COMPARISON:  Chest radiograph dated 02/12/2023 FINDINGS: Cardiovascular: Normal heart size. No significant pericardial fluid/thickening. Great vessels are normal in course and caliber. Coronary artery calcifications and aortic atherosclerosis. Mediastinum/Nodes: Imaged thyroid gland without nodules meeting criteria for imaging follow-up by size. Normal esophagus. No pathologically enlarged axillary, supraclavicular, mediastinal, or hilar lymph nodes. Lungs/Pleura: The central airways are patent. Upper lobe predominant paraseptal emphysema. No focal consolidation. No pneumothorax. No pleural effusion. Upper abdomen: Normal. Musculoskeletal: No acute or abnormal lytic or blastic osseous lesions. Multilevel degenerative changes of the thoracic spine.  IMPRESSION: 1. No acute intrathoracic abnormality. 2. Aortic Atherosclerosis (ICD10-I70.0) and Emphysema (ICD10-J43.9). Coronary artery calcifications. Assessment for potential risk factor modification, dietary therapy or pharmacologic therapy may be warranted, if clinically indicated. Electronically Signed   By: Agustin Cree M.D.   On: 02/12/2023 14:44   DG Chest 2 View  Result Date: 02/12/2023 CLINICAL DATA:  72 year old female with chest pain EXAM: CHEST - 2 VIEW COMPARISON:  08/27/2022 FINDINGS: Cardiomediastinal silhouette unchanged in size and contour. No evidence of central vascular congestion. No interlobular septal thickening. No pneumothorax or pleural effusion. Coarsened interstitial markings, with no confluent airspace disease. No acute displaced fracture. Degenerative changes of the spine. IMPRESSION: Negative for acute cardiopulmonary disease Electronically Signed   By: Gilmer Mor D.O.   On: 02/12/2023 12:35    Procedures Procedures: not indicated.   Medications Ordered in ED Medications  lidocaine (LIDODERM) 5 % 1 patch (1 patch Transdermal Patch Applied 02/12/23 1458)  aspirin chewable tablet 324 mg (324 mg Oral Given 02/12/23 1244)    ED Course/ Medical Decision Making/ A&P                                 Medical Decision Making Amount and/or Complexity of Data Reviewed Labs: ordered. Radiology: ordered.  Risk OTC drugs. Prescription drug management.   This patient presents to the ED for concern of chest pain, this involves an extensive number of treatment options, and is a complaint that carries with it a high risk of complications and morbidity.   Differential diagnosis includes: ACS, aortic dissection, pneumonia, pneumothorax, pleurisy, chest wall contusion, GERD, etc.   Co morbidities that complicate the patient evaluation  CHF CKD stage IV COPD   Additional history  Additional history obtained from patient's records.   Cardiac Monitoring / EKG  The  patient was maintained on a cardiac monitor.  I personally viewed and interpreted the cardiac monitored which showed: sinus rhythm with a heart rate of 71 bpm.   Lab Tests  I ordered and personally interpreted labs.  The pertinent results include:   Initial troponin of 13, repeat of 12 BUN of 89, creatinine of 2.88, [name] of 17, otherwise BMP  is within normal limits CBC is within normal limits - no acute anemia or infection.   Imaging Studies  I ordered imaging studies including CXR and chest CT.  I independently visualized and interpreted imaging which showed:  No acute cardiopulmonary disease on chest x-ray. No acute intrathoracic abnormality. Enlarged thyroid gland, repeat outpatient imaging recommended. I agree with the radiologist interpretation   Problem List / ED Course / Critical interventions / Medication management  Right-sided chest pain radiating to the back I ordered medications including:  Aspirin for pain  Reevaluation of the patient after these medicines showed that the patient improved the pain at her right chest but not her back. I ordered a lidocaine patch to place on her right shoulder blade I have reviewed the patients home medicines and have made adjustments as needed.   Social Determinants of Health  Tobacco use   Test / Admission - Considered  Discussed results with patient.  She is hemodynamically stable with normal vitals. Instructed to follow up with primary care for enlarged thyroid finding on CT chest. Prescriptions for Lidocaine patches sent to the pharmacy. Return precautions provided.       Final Clinical Impression(s) / ED Diagnoses Final diagnoses:  Atypical chest pain    Rx / DC Orders ED Discharge Orders          Ordered    lidocaine (HM LIDOCAINE PATCH) 4 %  Every 24 hours        02/12/23 1502              Maxwell Marion, PA-C 02/12/23 1529    Pricilla Loveless, MD 02/13/23 [phone]

## 2023-02-17 DIAGNOSIS — E113511 Type 2 diabetes mellitus with proliferative diabetic retinopathy with macular edema, right eye: Secondary | ICD-10-CM | POA: Diagnosis not present

## 2023-02-19 ENCOUNTER — Telehealth: Payer: Self-pay

## 2023-02-19 NOTE — Telephone Encounter (Signed)
Transition Care Management Follow-up Telephone Call Date of discharge and from where: 02/12/2023 [facility] How have you been since you were released from the hospital? Patient stated she is feeling better. Any questions or concerns? No  Items Reviewed: Did the pt receive and understand the discharge instructions provided? Yes  Medications obtained and verified?  Patient stated that insurance would not cover the lidocaine patch so she bought OTC biofreeze. Other? No  Any new allergies since your discharge? No  Dietary orders reviewed? Yes Do you have support at home? Yes   Follow up appointments reviewed:  PCP Hospital f/u appt confirmed? No  Scheduled to see  on  @ . Specialist Hospital f/u appt confirmed? Yes  Scheduled to see Robin Searing, NP on 03/01/2023 @ [facility]. Are transportation arrangements needed? No  If their condition worsens, is the pt aware to call PCP or go to the Emergency Dept.? Yes Was the patient provided with contact information for the PCP's office or ED? Yes Was to pt encouraged to call back with questions or concerns? Yes  Buffy Ehler [facility]  [facility] Population Health Community Resource Care Guide   [email or web address]  ?? [phone]   Website: [email or web address]

## 2023-02-22 ENCOUNTER — Other Ambulatory Visit: Payer: Self-pay | Admitting: Internal Medicine

## 2023-02-22 DIAGNOSIS — Z1231 Encounter for screening mammogram for malignant neoplasm of breast: Secondary | ICD-10-CM

## 2023-02-25 DIAGNOSIS — I5189 Other ill-defined heart diseases: Secondary | ICD-10-CM | POA: Diagnosis not present

## 2023-02-25 DIAGNOSIS — N189 Chronic kidney disease, unspecified: Secondary | ICD-10-CM | POA: Diagnosis not present

## 2023-02-25 DIAGNOSIS — I82409 Acute embolism and thrombosis of unspecified deep veins of unspecified lower extremity: Secondary | ICD-10-CM | POA: Diagnosis not present

## 2023-02-25 DIAGNOSIS — N184 Chronic kidney disease, stage 4 (severe): Secondary | ICD-10-CM | POA: Diagnosis not present

## 2023-02-25 DIAGNOSIS — N2581 Secondary hyperparathyroidism of renal origin: Secondary | ICD-10-CM | POA: Diagnosis not present

## 2023-02-25 DIAGNOSIS — D631 Anemia in chronic kidney disease: Secondary | ICD-10-CM | POA: Diagnosis not present

## 2023-02-25 DIAGNOSIS — E1122 Type 2 diabetes mellitus with diabetic chronic kidney disease: Secondary | ICD-10-CM | POA: Diagnosis not present

## 2023-02-25 DIAGNOSIS — I129 Hypertensive chronic kidney disease with stage 1 through stage 4 chronic kidney disease, or unspecified chronic kidney disease: Secondary | ICD-10-CM | POA: Diagnosis not present

## 2023-02-26 NOTE — Progress Notes (Unsigned)
Office Visit    Patient Name: Marisha Mojica Date of Encounter: 02/26/2023  Primary Care Provider:  Tresa Garter, MD Primary Cardiologist:  Kristeen Miss, MD Primary Electrophysiologist: None   Past Medical History    Past Medical History:  Diagnosis Date   Anemia    COPD (chronic obstructive pulmonary disease) (HCC)    Diabetes mellitus    Hyperlipidemia    Hypertension    Ischemic cardiomyopathy 06/11/2013   Low back pain    NSTEMI (non-ST elevated myocardial infarction) (HCC) 06/11/2013   Obesity    Personal history of colon cancer    Renal insufficiency    Past Surgical History:  Procedure Laterality Date   COLECTOMY     INCISION AND DRAINAGE PERIRECTAL ABSCESS N/A 03/07/2020   Procedure: IRRIGATION AND DEBRIDEMENT PERINEUM  AND PANNUS ABSCESS;  Surgeon: Harriette Bouillon, MD;  Location: [facility] OR;  Service: General;  Laterality: N/A;   IR RADIOLOGIST EVAL & MGMT  08/06/2020   LEFT HEART CATHETERIZATION WITH CORONARY ANGIOGRAM N/A 06/09/2013   Procedure: LEFT HEART CATHETERIZATION WITH CORONARY ANGIOGRAM;  Surgeon: Alvia Grove, MD;  Location: [facility] [place] CATH LAB;  Service: Cardiovascular;  Laterality: N/A;    Allergies  Allergies  Allergen Reactions   Tradjenta [Linagliptin] Anaphylaxis    CP   Albuterol     Palpitations   Atorvastatin Other (See Comments)    Patient unsure of allergy.   Biobrane Gloves Large [Wound Dressings]    Enalapril Maleate     REACTION: cough   Farxiga [Dapagliflozin] Itching   Hydrochlorothiazide     REACTION: hair loss   Kenalog [Triamcinolone Acetonide]     HANDS NUMB    Levaquin [Levofloxacin] Hives   Lisinopril    Metformin And Related     Diarrhea, dizziness   Propoxyphene N-Acetaminophen Hives and Other (See Comments)    Patient unsure of allergy.   Simvastatin     REACTION: cramps   Spironolactone     REACTION: cramps     History of Present Illness    Pernell Lanoue  is a 72 year old female with a  PMH of ED s/p NSTEMI 06/2013 s/p PCI/DES to mid LAD, DVT, ICM HFpEF, CKD stage IV, HTN, HLD, COPD, colon cancer, obesity who presents today for post ED and 1 year follow-up.  Ms. Mcnalley was seen admission for respiratory failure and found to have troponin. NSTEMI was diagnosed and patient underwent LHC 5% LAD stenosis noted treated with DES x 1.  Previous 2D echo completed 06/2013 showed reduced EF of 30-35%.  Repeat 2D echo was completed also EF of 60 to 65% with no RWMA and grade 1 DD with trivial MVR.  She stopped smoking in 2015 following her heart catheterization. She suffered a fall in mid 2023 and was seen by PCP with continued swelling and edema.  She underwent Doppler was found to have new DVT and was started on Eliquis 5 mg for 6 months. She was last seen by Dr. Elease Hashimoto on 12/2021 no changes made at that time.  She was seen on 02/12/2023 in the ED with complaint of chest and back pain. She reported pain to the right side of her chest that was relieved with nitroglycerin. 2D echo was completed showing no acute changes CT of the chest and chest x-ray showed no acute changes.  She was given aspirin with improvement to pain and lidocaine patch was also placed to right shoulder blade.  Troponins were also trended  and were negative.  Since last being seen in the office patient reports that she has been feeling much better since being seen in the ED for back and shoulder pain.  Her blood pressure today was controlled at 128/64 and heart rate was 73 bpm.  He is compliant with her current medication regimen and denies any adverse effects.  She reported severe pain that lasted for 4 days straight and began in her right shoulder and spread to her substernal area.  She denies eating foods that will trigger reflux such as greasy and spicy items.  She is currently not physically active but reported recently having to run and jump on a car due to a pitbull chasing her.  She denied any chest discomfort but had lots of  shortness of breath when this occurred.  Patient denies chest pain, palpitations, dyspnea, PND, orthopnea, nausea, vomiting, dizziness, syncope, edema, weight gain, or early satiety.  Home Medications    Current Outpatient Medications  Medication Sig Dispense Refill   acetaminophen (TYLENOL) 325 MG tablet Take 325 mg by mouth every 6 (six) hours as needed.     allopurinol (ZYLOPRIM) 100 MG tablet Take 50 mg by mouth daily.     amLODipine (NORVASC) 10 MG tablet TAKE ONE-HALF TABLET BY MOUTH  DAILY 50 tablet 0   Ascorbic Acid (VITAMIN C) 1000 MG tablet Take 500 mg by mouth daily.     aspirin EC 81 MG tablet Take 2 tablets (162 mg total) by mouth daily.     calcium carbonate (OS-CAL) 600 MG TABS tablet Take 600 mg by mouth 2 (two) times daily with a meal.     Cholecalciferol (VITAMIN D3) 50 MCG (2000 UT) capsule Take 1 capsule (2,000 Units total) by mouth daily. 100 capsule 3   ferrous sulfate 325 (65 FE) MG tablet Take 325 mg by mouth 2 (two) times daily with a meal.     glipiZIDE (GLUCOTROL XL) 5 MG 24 hr tablet TAKE 1 TABLET BY MOUTH DAILY  WITH BREAKFAST 100 tablet 2   glucose blood (ONETOUCH ULTRA) test strip USE TO TEST BLOOD SUGAR TWICE  DAILY AS DIRECTED 200 strip 0   hydrALAZINE (APRESOLINE) 25 MG tablet TAKE 1 TABLET BY MOUTH THREE TIMES A DAY 90 tablet 0   lansoprazole (PREVACID) 15 MG capsule Take 1 capsule (15 mg total) by mouth daily at 12 noon. 90 capsule 3   lidocaine (HM LIDOCAINE PATCH) 4 % Place 1 patch onto the skin daily for 14 days. 14 patch 0   nebivolol (BYSTOLIC) 10 MG tablet TAKE 1 TABLET BY MOUTH EVERY DAY 90 tablet 3   ondansetron (ZOFRAN) 4 MG tablet Take 1 tablet (4 mg total) by mouth every 8 (eight) hours as needed for nausea or vomiting. 20 tablet 0   potassium chloride SA (KLOR-CON M) 20 MEQ tablet Take 1 tablet (20 mEq total) by mouth daily. Annual appt due in Oct must see provider for future refills 100 tablet 0   rosuvastatin (CRESTOR) 5 MG tablet TAKE 1 TABLET  (5 MG TOTAL) BY MOUTH DAILY. TAKE 1 TABLET BY MOUTH DAILY AT 6PM (Patient taking differently: Take 5 mg by mouth every other day.) 90 tablet 2   torsemide (DEMADEX) 10 MG tablet Take 2.5 tablets (25 mg total) by mouth daily. 225 tablet 3   triamcinolone cream (KENALOG) 0.1 % Apply 1 Application topically 3 (three) times daily. On rash 160 g 3   vitamin B-12 (CYANOCOBALAMIN) 1000 MCG tablet Take 1,000 mcg by  mouth daily.     No current facility-administered medications for this visit.     Review of Systems  Please see the history of present illness.    (+) Reflux (+) Back and chest pain  All other systems reviewed and are otherwise negative except as noted above.  Physical Exam    Wt Readings from Last 3 Encounters:  01/25/23 183 lb (83 kg)  10/26/22 189 lb (85.7 kg)  09/11/22 188 lb 9.6 oz (85.5 kg)   RU:EAVWU were no vitals filed for this visit.,There is no height or weight on file to calculate BMI.  Constitutional:      Appearance: Healthy appearance. Not in distress.  Neck:     Vascular: JVD normal.  Pulmonary:     Effort: Pulmonary effort is normal.     Breath sounds: No wheezing. No rales. Diminished in the bases Cardiovascular:     Normal rate. Regular rhythm. Normal S1. Normal S2.      Murmurs: 2/3 systolic murmur Edema:    Peripheral edema absent.  Abdominal:     Palpations: Abdomen is soft non tender. There is no hepatomegaly.  Skin:    General: Skin is warm and dry.  Neurological:     General: No focal deficit present.     Mental Status: Alert and oriented to person, place and time.     Cranial Nerves: Cranial nerves are intact.  EKG/LABS/ Recent Cardiac Studies    ECG personally reviewed by me today -none completed today   Lab Results  Component Value Date   CREATININE 2.88 (H) 02/12/2023   BUN 89 (H) 02/12/2023   NA 139 02/12/2023   K 4.0 02/12/2023   CL 108 02/12/2023   CO2 18 (L) 02/12/2023   Lab Results  Component Value Date   ALT 11  12/16/2021   AST 14 07/17/2021   ALKPHOS 134 (H) 07/17/2021   BILITOT 0.2 07/17/2021   Lab Results  Component Value Date   CHOL 129 12/16/2021   HDL 49 12/16/2021   LDLCALC 65 12/16/2021   TRIG 78 12/16/2021   CHOLHDL 2.6 12/16/2021    Lab Results  Component Value Date   HGBA1C 6.9 (A) 09/11/2022     Assessment & Plan    1.  Coronary artery disease: -s/p NSTEMI 06/2013 treated with DES to LAD -Patient recently seen in the ED with complaint of back and chest pain with negative ACS and pain relieved with analgesics and IcyHot patch -In the patient's history we will have her complete exercise Myoview to rule out possible new ischemia related to the chest pain. -Continue GDMT with ASA 81 mg, Bystolic 10 mg Crestor 5 mg, as needed Nitrostat 0.4 mg  2.  HFpEF: -In 2014 reduced EF of 30-35% and most recent 2D echo completed 05/2019 showing EF of 60-65% with diastolic dysfunction -Patient reports increased shortness of breath with activity and has chronic lower extremity edema with +3 on the left and +2 on the right. -She is limited in GDMT due to renal function -Continue GDMT with hydralazine 25 mg 3 times daily, Bystolic 10 mg daily, torsemide 25 mg daily -We will obtain updated 2D echo to evaluate for structural and valvular heart changes. -Low sodium diet, fluid restriction <2L, and daily weights encouraged. Educated to contact our office for weight gain of 2 lbs overnight or 5 lbs in one week.   3.  Hyperlipidemia: -Patient's last cholesterol was 65 -Continue Crestor 5 mg daily  4.  DM type II: -  Patient's hemoglobin A1c was 6.9 -Continue treatment plan per PCP  5.  CKD stage IV: -Patient's current GDMT limited by elevated creatinine -Currently followed by [facility]     Disposition: Follow-up with Kristeen Miss, MD or APP in 6 months Informed Consent   Shared Decision Making/Informed Consent The risks [chest pain, shortness of breath, cardiac arrhythmias,  dizziness, blood pressure fluctuations, myocardial infarction, stroke/transient ischemic attack, nausea, vomiting, allergic reaction, radiation exposure, metallic taste sensation and life-threatening complications (estimated to be 1 in 10,000)], benefits (risk stratification, diagnosing coronary artery disease, treatment guidance) and alternatives of a nuclear stress test were discussed in detail with Ms. Pignotti and she agrees to proceed.      Medication Adjustments/Labs and Tests Ordered: Current medicines are reviewed at length with the patient today.  Concerns regarding medicines are outlined above.   Signed, Napoleon Form, Leodis Rains, NP 02/26/2023, 11:54 AM [facility]

## 2023-03-01 ENCOUNTER — Ambulatory Visit: Payer: 59 | Attending: Nurse Practitioner | Admitting: Nurse Practitioner

## 2023-03-01 ENCOUNTER — Encounter: Payer: Self-pay | Admitting: Nurse Practitioner

## 2023-03-01 VITALS — BP 128/64 | HR 73 | Ht 62.0 in | Wt 184.2 lb

## 2023-03-01 DIAGNOSIS — E1159 Type 2 diabetes mellitus with other circulatory complications: Secondary | ICD-10-CM

## 2023-03-01 DIAGNOSIS — E1165 Type 2 diabetes mellitus with hyperglycemia: Secondary | ICD-10-CM | POA: Diagnosis not present

## 2023-03-01 DIAGNOSIS — I5032 Chronic diastolic (congestive) heart failure: Secondary | ICD-10-CM

## 2023-03-01 DIAGNOSIS — I2510 Atherosclerotic heart disease of native coronary artery without angina pectoris: Secondary | ICD-10-CM

## 2023-03-01 DIAGNOSIS — I152 Hypertension secondary to endocrine disorders: Secondary | ICD-10-CM

## 2023-03-01 DIAGNOSIS — J449 Chronic obstructive pulmonary disease, unspecified: Secondary | ICD-10-CM

## 2023-03-01 DIAGNOSIS — N184 Chronic kidney disease, stage 4 (severe): Secondary | ICD-10-CM

## 2023-03-01 DIAGNOSIS — Z7984 Long term (current) use of oral hypoglycemic drugs: Secondary | ICD-10-CM | POA: Diagnosis not present

## 2023-03-01 NOTE — Patient Instructions (Addendum)
Medication Instructions:  Your physician recommends that you continue on your current medications as directed. Please refer to the Current Medication list given to you today. *If you need a refill on your cardiac medications before your next appointment, please call your pharmacy*   Lab Work: None ordered   Testing/Procedures: Your physician has requested that you have an echocardiogram. Echocardiography is a painless test that uses sound waves to create images of your heart. It provides your doctor with information about the size and shape of your heart and how well your heart's chambers and valves are working. This procedure takes approximately one hour. There are no restrictions for this procedure. Please do NOT wear cologne, perfume, aftershave, or lotions (deodorant is allowed). Please arrive 15 minutes prior to your appointment time.  Your physician has requested that you have an exercise stress myoview. For further information please visit [email or web address] Please follow instruction sheet, as given.   Follow-Up: At [facility], you and your health needs are our priority.  As part of our continuing mission to provide you with exceptional heart care, we have created designated Provider Care Teams.  These Care Teams include your primary Cardiologist (physician) and Advanced Practice Providers (APPs -  Physician Assistants and Nurse Practitioners) who all work together to provide you with the care you need, when you need it.  We recommend signing up for the patient portal called "MyChart".  Sign up information is provided on this After Visit Summary.  MyChart is used to connect with patients for Virtual Visits (Telemedicine).  Patients are able to view lab/test results, encounter notes, upcoming appointments, etc.  Non-urgent messages can be sent to your provider as well.   To learn more about what you can do with MyChart, go to [email or web address].    Your next  appointment:   6 month(s)  Provider:   Kristeen Miss, MD  or Robin Searing, NP   Other Instructions

## 2023-03-06 ENCOUNTER — Other Ambulatory Visit: Payer: Self-pay | Admitting: Internal Medicine

## 2023-03-09 ENCOUNTER — Other Ambulatory Visit: Payer: Self-pay | Admitting: Cardiovascular Disease

## 2023-03-12 ENCOUNTER — Ambulatory Visit
Admission: RE | Admit: 2023-03-12 | Discharge: 2023-03-12 | Disposition: A | Discharge status: Home / Self Care | Payer: 59 | Source: Ambulatory Visit | Attending: Internal Medicine | Admitting: Internal Medicine

## 2023-03-12 DIAGNOSIS — Z1231 Encounter for screening mammogram for malignant neoplasm of breast: Secondary | ICD-10-CM | POA: Diagnosis not present

## 2023-03-15 ENCOUNTER — Encounter: Payer: Self-pay | Admitting: Internal Medicine

## 2023-03-15 ENCOUNTER — Ambulatory Visit (INDEPENDENT_AMBULATORY_CARE_PROVIDER_SITE_OTHER): Payer: 59 | Admitting: Internal Medicine

## 2023-03-15 VITALS — BP 118/70 | HR 79 | Ht 62.0 in | Wt 185.8 lb

## 2023-03-15 DIAGNOSIS — Z7984 Long term (current) use of oral hypoglycemic drugs: Secondary | ICD-10-CM | POA: Diagnosis not present

## 2023-03-15 DIAGNOSIS — Z789 Other specified health status: Secondary | ICD-10-CM | POA: Diagnosis not present

## 2023-03-15 DIAGNOSIS — E1159 Type 2 diabetes mellitus with other circulatory complications: Secondary | ICD-10-CM | POA: Diagnosis not present

## 2023-03-15 DIAGNOSIS — E1165 Type 2 diabetes mellitus with hyperglycemia: Secondary | ICD-10-CM | POA: Diagnosis not present

## 2023-03-15 DIAGNOSIS — E785 Hyperlipidemia, unspecified: Secondary | ICD-10-CM

## 2023-03-15 DIAGNOSIS — E049 Nontoxic goiter, unspecified: Secondary | ICD-10-CM | POA: Diagnosis not present

## 2023-03-15 LAB — POCT GLYCOSYLATED HEMOGLOBIN (HGB A1C): Hemoglobin A1C: 7 % — AB (ref 4.0–5.6)

## 2023-03-15 NOTE — Patient Instructions (Addendum)
Please continue: - Glipizide ER 5-10 mg before breakfast  Discuss with Dr. Signe Colt if we can use [place] or [place].  Please return in 4-6 months with your sugar log.

## 2023-03-15 NOTE — Progress Notes (Addendum)
Patient ID: [name], female   DOB: [birth year], 72 y.o.   MRN: [id number]  HPI: [name] is a 72 y.o.-year-old female, returning for f/u DM2, dx in 2006, insulin-independent, uncontrolled, with complications (CAD - s/p AMI 06/2013, CHF; CKD; PN; DR). Last visit 6 months ago.  Interim history: No increased urination, blurry vision, nausea, chest pain. Last year, her sugars increased to 400s after getting steroids for left foot gout.  No steroids since last visit. She is on tart cherry diet juice. Before last visit, she started a Mediterranean diet. She had R shoulder pain radiating to the chest >> ED 02/12/2023. CT chest: enlarged thyroid without nodules.  Reviewed HbA1c levels: Lab Results  Component Value Date   HGBA1C 6.9 (A) 09/11/2022   HGBA1C 7.1 (A) 03/12/2022   HGBA1C 7.3 (A) 09/11/2021  05/12/2021: HbA1c calculated from fructosamine is: 7.35%, lower than the directly measured HbA1c. 05/16/2020: HbA1c calculated from fructosamine is: 7.4%, lower than the directly measured HbA1c. 05/02/2019: HbA1c calculated from fructosamine was 7.0% 12/30/2018: HbA1c calculated from fructosamine is higher, at 8.2%, possibly 2/2 drinking juice 08/24/2018: HbA1c calculated from fructosamine is higher, at 8%, possibly 2/2 ABx  04/20/2018: HbA1c calculated from fructosamine is slightly better than before, at 7% 12/15/2017: HbA1c calculated from fructosamine: 7.17%, slightly improved from last visit  08/17/2017: HbA1c calculated from fructosamine: 7.2%, slightly improved 04/15/2017: HbA1c calculated from the fructosamine is lower than measured, at 7.3% 08/21/2016: HbA1c 6.0%. She started to change her diet after her hemoglobin A1c returned high, at 16.2% in 09/2014. She cut down portions, changed the meal content to include more fiber and less carbs.  HbA1c decreased dramatically.  Pt is on: - Glipizide ER 5 mg in a.m.-started 2016 >> 10 mg >> 5 mg before breakfast (10 mg during  steroid treatment) >> 5-10 mg in a.m. She stopped metformin ER in 01/2017. She tried Tradjenta 5 mg daily in am >> CP with it (started 10/2014) >> had to stop She tried [place] >> decreased [name] and yeast inf. She tried regular metformin >> diarrhea She refused insulin in the past.  Pt checks her sugars twice a day per review of her log: - am:  70, 79-128, 136, 138 >> 77, 84-121, 127 >> 92-136 - 2h after b'fast: 122-139 >> 129-144 >> 130-140 >> 114 >> n/c  - before lunch: 125 >> 89, 121-151 >> 103-141, 158 >> n/c - 2h after lunch: 169 >> 120-130 >> n/c  - before dinner:  110-144 >> 118-130 >> n/c - 2h after dinner: 97-144, 149 >> 70, 120-144, 147 >> 89-148 - bedtime: 137 >> n/c >> 147-157 >> n/c - nighttime: n/c Lowest sugar was   90 >> 77; it is unclear at which level she has hypoglycemia awareness. Highest sugar was 400 (Prednisone) >> 180 >> 149 >> 147 >> 147.  Glucometer: One Touch Ultra  Pt's meals are: - Breakfast: oatmeal, cereals, [place] bacon, egg toast; cinnamon on oatmeal or cheerios - Lunch: sandwich or fruit salad - Dinner: salmon/chicken, Brussel sprouts, other veggies, sweet potatoes or brown rice, spaghetti - Snacks: 2: carrots with dip; yoghurt with fruit, veggies + dip; fruit She walks for exercise. She saw Dr. Signe Colt with nephrology.  She was started on a high potassium, low protein diet  -+ CKD-sees nephrology; latest BUN/creatinine: Lab Results  Component Value Date   BUN 89 (H) 02/12/2023   CREATININE 2.88 (H) 02/12/2023  06/10/2022: Protein/creatinine ratio 259 (0-200) 02/05/2022: 66/2.72, [name] 18, glucose 136, protein to  creatinine ratio 105 (0-200), ACR 37 (0-29) 08/09/2020: 68/2.15, [name] 26, glucose 145, protein to creatinine ratio 68, ACR 9 She stopped olmesartan due to worsening kidney function and cough.  -+ HL; last set of lipids: Lab Results  Component Value Date   CHOL 129 12/16/2021   HDL 49 12/16/2021   LDLCALC 65 12/16/2021   TRIG 78 12/16/2021    CHOLHDL 2.6 12/16/2021  On Crestor 5 daily.  - last eye exam was in 01/13/2023: + DR, + cataract reportedly.  Had laser surgeries. Dr. Allena Katz.   - she has numbness and tingling in her feet.  Last foot exam 09/11/2022.  She also has a history of HTN, GERD, and anemia. In 02/2020, she was admitted multiple perineal abscesses and cellulitis.  These were I&D and she was started on antibiotics. She was in [facility] for 2 weeks - she did not like her care there, including the meals. Of note, patient discussed with nephrology about dialysis and she would never want to proceed with this. She was started on Eliquis after a traumatic DVT in 11/2021.   Goiter: A thyroid ultrasound from 2016 showed only small cysts. A CT scan (02/12/2023): nonnodular goiter  Pt denies: - feeling nodules in neck - hoarseness - dysphagia - choking  Latest TSH normal: Lab Results  Component Value Date   TSH 1.51 07/17/2021   ROS: + see HPI  I reviewed pt's medications, allergies, PMH, social hx, family hx, and changes were documented in the history of present illness. Otherwise, unchanged from my initial visit note.  Past Medical History:  Diagnosis Date   Anemia    COPD (chronic obstructive pulmonary disease) (HCC)    Diabetes mellitus    Hyperlipidemia    Hypertension    Ischemic cardiomyopathy 06/11/2013   Low back pain    NSTEMI (non-ST elevated myocardial infarction) (HCC) 06/11/2013   Obesity    Personal history of colon cancer    Renal insufficiency    Past Surgical History:  Procedure Laterality Date   COLECTOMY     INCISION AND DRAINAGE PERIRECTAL ABSCESS N/A 03/07/2020   Procedure: IRRIGATION AND DEBRIDEMENT PERINEUM  AND PANNUS ABSCESS;  Surgeon: Harriette Bouillon, MD;  Location: [facility] OR;  Service: General;  Laterality: N/A;   IR RADIOLOGIST EVAL & MGMT  08/06/2020   LEFT HEART CATHETERIZATION WITH CORONARY ANGIOGRAM N/A 06/09/2013   Procedure: LEFT HEART CATHETERIZATION WITH CORONARY  ANGIOGRAM;  Surgeon: Alvia Grove, MD;  Location: [facility] C[facility] LAB;  Service: Cardiovascular;  Laterality: N/A;   History   Social History   Marital Status: Single    Spouse Name: N/A   Number of Children: 0   Occupational History   retired   Social History Main Topics   Smoking status:  former smoker     Types: Cigarettes   Smokeless tobacco: Never Used   Alcohol Use: No   Drug Use: No   Current Outpatient Medications on File Prior to Visit  Medication Sig Dispense Refill   acetaminophen (TYLENOL) 325 MG tablet Take 325 mg by mouth every 6 (six) hours as needed.     allopurinol (ZYLOPRIM) 100 MG tablet Take 50 mg by mouth daily.     amLODipine (NORVASC) 10 MG tablet TAKE ONE-HALF TABLET BY MOUTH  DAILY 50 tablet 0   Ascorbic Acid (VITAMIN C) 1000 MG tablet Take 500 mg by mouth daily.     aspirin EC 81 MG tablet Take 2 tablets (162 mg total) by mouth daily.  calcium carbonate (OS-CAL) 600 MG TABS tablet Take 600 mg by mouth 2 (two) times daily with a meal.     Cholecalciferol (VITAMIN D3) 50 MCG (2000 UT) capsule Take 1 capsule (2,000 Units total) by mouth daily. 100 capsule 3   ferrous sulfate 325 (65 FE) MG tablet Take 325 mg by mouth 2 (two) times daily with a meal.     glipiZIDE (GLUCOTROL XL) 5 MG 24 hr tablet TAKE 1 TABLET BY MOUTH DAILY  WITH BREAKFAST 100 tablet 2   hydrALAZINE (APRESOLINE) 25 MG tablet TAKE 1 TABLET BY MOUTH THREE TIMES A DAY 270 tablet 3   lansoprazole (PREVACID) 15 MG capsule Take 1 capsule (15 mg total) by mouth daily at 12 noon. 90 capsule 3   nebivolol (BYSTOLIC) 10 MG tablet TAKE 1 TABLET BY MOUTH EVERY DAY 90 tablet 3   ondansetron (ZOFRAN) 4 MG tablet Take 1 tablet (4 mg total) by mouth every 8 (eight) hours as needed for nausea or vomiting. 20 tablet 0   ONETOUCH ULTRA test strip USE TO TEST BLOOD SUGAR TWICE  DAILY AS DIRECTED 200 strip 2   potassium chloride SA (KLOR-CON M) 20 MEQ tablet Take 1 tablet (20 mEq total) by mouth daily. Annual  appt due in Oct must see provider for future refills 100 tablet 0   rosuvastatin (CRESTOR) 5 MG tablet TAKE 1 TABLET (5 MG TOTAL) BY MOUTH DAILY. TAKE 1 TABLET BY MOUTH DAILY AT 6PM (Patient taking differently: Take 5 mg by mouth every other day.) 90 tablet 2   torsemide (DEMADEX) 10 MG tablet Take 2.5 tablets (25 mg total) by mouth daily. 225 tablet 3   triamcinolone cream (KENALOG) 0.1 % Apply 1 Application topically 3 (three) times daily. On rash 160 g 3   vitamin B-12 (CYANOCOBALAMIN) 1000 MCG tablet Take 1,000 mcg by mouth daily.     No current facility-administered medications on file prior to visit.   Allergies  Allergen Reactions   Tradjenta [Linagliptin] Anaphylaxis    CP   Albuterol     Palpitations   Atorvastatin Other (See Comments)    Patient unsure of allergy.   Biobrane Gloves Large [Wound Dressings]    Enalapril Maleate     REACTION: cough   Farxiga [Dapagliflozin] Itching   Hydrochlorothiazide     REACTION: hair loss   Kenalog [Triamcinolone Acetonide]     HANDS NUMB    Levaquin [Levofloxacin] Hives   Lisinopril    Metformin And Related     Diarrhea, dizziness   Propoxyphene N-Acetaminophen Hives and Other (See Comments)    Patient unsure of allergy.   Simvastatin     REACTION: cramps   Spironolactone     REACTION: cramps   Family History  Problem Relation Age of Onset   Hypertension Other    Breast cancer Neg Hx    PE: BP 118/70   Pulse 79   Ht 5\' 2"  (1.575 m)   Wt 185 lb 12.8 oz (84.3 kg)   LMP  (LMP Unknown)   SpO2 98%   BMI 33.98 kg/m   Wt Readings from Last 3 Encounters:  03/15/23 185 lb 12.8 oz (84.3 kg)  03/01/23 184 lb 3.2 oz (83.6 kg)  01/25/23 183 lb (83 kg)   Constitutional: overweight, in NAD Eyes: EOMI, no exophthalmos ENT: no thyromegaly, no cervical lymphadenopathy Cardiovascular: RRR, No RG, + 1/6 SEM, + L>R LE edema Respiratory: CTA B Musculoskeletal: no deformities Skin: no rashes Neurological: no tremor with  outstretched  hands  ASSESSMENT: 1. DM2, insulin-independent, uncontrolled, with complications - CAD, s/p AMI 06/2013 - Dr Elease Hashimoto - CHF - CKD - PN - DR - Dr. Carmela Rima ([facility]) - on IO injections >> improving  2. Goiter - No neck compression symptoms  - 10/19/2014: thyroid ultrasound: Right thyroid lobe: 4.0 x 1.5 x 1.6 cm. Heterogeneous parenchyma with multiple small cysts identified. The largest measures approximately 0.5 x 0.3 x 0.5 cm. All of the small cysts in the right lobe appears simple and benign.  Left thyroid lobe: 4.5 x 1.4 x 2.0 cm. Heterogeneous parenchyma with multiple cysts. The largest measures approximately 1.0 x 0.4 x 0.5 cm. 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 Similar smaller cyst measures 0.8 cm in greatest diameter. There is a small solid nodule measuring 0.7 x 0.4 x 0.6 cm.  Isthmus Thickness: 0.4 cm.  No nodules visualized.  Lymphadenopathy: None visualized.            Multicystic thyroid.  3. HL  PLAN:  1. Patient with longstanding, uncontrolled diabetes, on sulfonylurea only, after she came off metformin due to improved diet.  She also switched to a more plant-based diet and reduce processed foods and sweets.  Afterwards, however, she relaxed her diet and sugars increased.  HbA1c was 7.3% at the beginning of last year but afterwards, she started on a Mediterranean diet and this is working well for her.  At last visit, HbA1c was lower, at 6.9% and sugars were at goal.  We continued the same regimen. -At today's visit, her blood sugars are mostly at goal, with only occasional very mild hyperglycemic spikes.  We can continue with the current regimen for now, but we also discussed about possibly switching to an SGLT2 inhibitor.  Her [name] on the last BMP was quite low, at 17 and I advised her to check with Dr. Signe Colt to see if we can use this class of medications.  She agrees with this plan.  Until then, will  continue glipizide. -I advised her to: Patient Instructions  Please continue: - Glipizide ER 5-10 mg before breakfast  Discuss with Dr. Signe Colt if we can use [place] or [place].  Please return in 4-6 months with your sugar log.  - we checked her HbA1c: 7.0% (slightly higher) - advised to check sugars at different times of the day - 1x a day, rotating check times - advised for yearly eye exams >> she is UTD - return to clinic in 4-6 months   2. Goiter -No neck compression symptoms -On the thyroid ultrasound, she only had a small thyroid cyst -She had a CT scan of her chest recently and was advised that her thyroid was enlarged.  At today's visit, we reviewed the report together.  I advised her that we are aware of the goiter, but no intervention is needed since her TFTs are normal and she does not have thyroid nodules -Latest TSH was normal Lab Results  Component Value Date   TSH 1.51 07/17/2021  -Will continue follow her clinically for now  3. HL and 4.  Statin intolerance -Latest lipid panel from 12/2021 was reviewed: LDL higher than our goal of less than 55, otherwise fractions at goal: Lab Results  Component Value Date   CHOL 129 12/16/2021   HDL 49 12/16/2021   LDLCALC 65 12/16/2021   TRIG 78 12/16/2021   CHOLHDL 2.6 12/16/2021  -She is not on statins-due to muscle aches and cramps   Silvestre Mesi  Elvera Lennox, MD PhD [facility]

## 2023-03-18 ENCOUNTER — Telehealth (HOSPITAL_COMMUNITY): Payer: Self-pay | Admitting: *Deleted

## 2023-03-18 NOTE — Telephone Encounter (Signed)
Patient given detailed instructions per Myocardial Perfusion Study Information Sheet for the test on 03/22/2023 at 10:00. Patient notified to arrive 15 minutes early and that it is imperative to arrive on time for appointment to keep from having the test rescheduled.  If you need to cancel or reschedule your appointment, please call the office within 24 hours of your appointment. . Patient verbalized understanding.[name]

## 2023-03-22 ENCOUNTER — Telehealth: Payer: Self-pay | Admitting: Cardiovascular Disease

## 2023-03-22 ENCOUNTER — Ambulatory Visit (HOSPITAL_BASED_OUTPATIENT_CLINIC_OR_DEPARTMENT_OTHER): Payer: 59

## 2023-03-22 ENCOUNTER — Ambulatory Visit (HOSPITAL_COMMUNITY): Payer: 59 | Attending: Nurse Practitioner

## 2023-03-22 DIAGNOSIS — I2510 Atherosclerotic heart disease of native coronary artery without angina pectoris: Secondary | ICD-10-CM

## 2023-03-22 DIAGNOSIS — I152 Hypertension secondary to endocrine disorders: Secondary | ICD-10-CM

## 2023-03-22 DIAGNOSIS — E1165 Type 2 diabetes mellitus with hyperglycemia: Secondary | ICD-10-CM | POA: Diagnosis not present

## 2023-03-22 DIAGNOSIS — E1159 Type 2 diabetes mellitus with other circulatory complications: Secondary | ICD-10-CM | POA: Insufficient documentation

## 2023-03-22 DIAGNOSIS — N184 Chronic kidney disease, stage 4 (severe): Secondary | ICD-10-CM | POA: Insufficient documentation

## 2023-03-22 DIAGNOSIS — I5032 Chronic diastolic (congestive) heart failure: Secondary | ICD-10-CM

## 2023-03-22 LAB — ECHOCARDIOGRAM COMPLETE
Area-P 1/2: 3.03 cm2
Height: 62 in
P 1/2 time: 353 ms
S' Lateral: 2.6 cm
Weight: 2912 [oz_av]

## 2023-03-22 LAB — MYOCARDIAL PERFUSION IMAGING
Estimated workload: 1.2
Exercise duration (min): 1 min
LV dias vol: 73 mL (ref 46–106)
LV sys vol: 22 mL
MPHR: 148 {beats}/min
Nuc Stress EF: 70 %
Peak HR: 88 {beats}/min
Percent HR: 88 %
RPE: 19
Rest HR: 64 {beats}/min
Rest Nuclear Isotope Dose: 10.2 mCi
SDS: 0
SRS: 0
SSS: 0
ST Depression (mm): 0 mm
Stress Nuclear Isotope Dose: 32.9 mCi
TID: 0.98

## 2023-03-22 MED ORDER — REGADENOSON 0.4 MG/5ML IV SOLN
0.4000 mg | Freq: Once | INTRAVENOUS | Status: AC
Start: 2023-03-22 — End: 2023-03-22
  Administered 2023-03-22: 0.4 mg via INTRAVENOUS

## 2023-03-22 MED ORDER — TECHNETIUM TC 99M TETROFOSMIN IV KIT
32.9000 | PACK | Freq: Once | INTRAVENOUS | Status: AC | PRN
  Administered 2023-03-22: 32.9 via INTRAVENOUS

## 2023-03-22 MED ORDER — TECHNETIUM TC 99M TETROFOSMIN IV KIT
10.2000 | PACK | Freq: Once | INTRAVENOUS | Status: AC | PRN
  Administered 2023-03-22: 10.2 via INTRAVENOUS

## 2023-03-22 NOTE — Telephone Encounter (Signed)
Patient is returning call. Please advise? 

## 2023-03-22 NOTE — Telephone Encounter (Signed)
Patient notified directly and voiced understanding.

## 2023-03-25 ENCOUNTER — Ambulatory Visit (INDEPENDENT_AMBULATORY_CARE_PROVIDER_SITE_OTHER): Payer: 59

## 2023-03-25 VITALS — Ht 62.0 in | Wt 185.0 lb

## 2023-03-25 DIAGNOSIS — Z0000 Encounter for general adult medical examination without abnormal findings: Secondary | ICD-10-CM

## 2023-03-25 NOTE — Patient Instructions (Addendum)
Ms. Fiorito , Thank you for taking time to come for your Medicare Wellness Visit. I appreciate your ongoing commitment to your health goals. Please review the following plan we discussed and let me know if I can assist you in the future.   Referrals/Orders/Follow-Ups/Clinician Recommendations:   This is a list of the screening recommended for you and due dates:  Health Maintenance  Topic Date Due   Pneumonia Vaccine (1 of 2 - PCV) Never done   Hepatitis C Screening  Never done   DTaP/Tdap/Td vaccine (1 - Tdap) Never done   Zoster (Shingles) Vaccine (1 of 2) Never done   Colon Cancer Screening  10/26/2023*   Complete foot exam   09/11/2023   Hemoglobin A1C  09/12/2023   Yearly kidney health urinalysis for diabetes  10/09/2023   Eye exam for diabetics  01/13/2024   Yearly kidney function blood test for diabetes  02/12/2024   Mammogram  03/11/2024   Medicare Annual Wellness Visit  03/24/2024   DEXA scan (bone density measurement)  Completed   HPV Vaccine  Aged Out   Flu Shot  Discontinued   COVID-19 Vaccine  Discontinued  *Topic was postponed. The date shown is not the original due date.    Advanced directives: (Copy Requested) Please bring a copy of your health care power of attorney and living will to the office to be added to your chart at your convenience.  Next Medicare Annual Wellness Visit scheduled for next year: Yes

## 2023-03-25 NOTE — Progress Notes (Cosign Needed Addendum)
Subjective:   [name] is a 72 y.o. female who presents for Medicare Annual (Subsequent) preventive examination.  Visit Complete: Virtual  I connected with  [name] on 03/25/23 by a audio enabled telemedicine application and verified that I am speaking with the correct person using two identifiers.  Patient Location: Home  Provider Location: Home Office  I discussed the limitations of evaluation and management by telemedicine. The patient expressed understanding and agreed to proceed.   Vital Signs: Unable to obtain new vitals due to this being a telehealth visit.   Cardiac Risk Factors include: advanced age (>48men, >79 women);diabetes mellitus;hypertension;smoking/ tobacco exposure     Objective:    Today's Vitals   03/25/23 0909  Weight: 185 lb (83.9 kg)  Height: 5\' 2"  (1.575 m)   Body mass index is 33.84 kg/m.     03/25/2023    9:18 AM 02/12/2023   10:57 AM 04/10/2022    2:40 PM 03/07/2020    2:28 PM 03/06/2020    9:10 PM 09/29/2017    2:12 PM 06/09/2013   10:00 PM  Advanced Directives  Does Patient Have a Medical Advance Directive? Yes No No No Unable to assess, patient is non-responsive or altered mental status No Patient does not have advance directive  Type of Public librarian Power of [place];Living will        Copy of Healthcare Power of Attorney in Chart? No - copy requested        Would patient like information on creating a medical advance directive?   No - Patient declined No - Patient declined  No - Patient declined   Pre-existing out of facility DNR order (yellow form or pink MOST form)       No    Current Medications (verified) Outpatient Encounter Medications as of 03/25/2023  Medication Sig   acetaminophen (TYLENOL) 325 MG tablet Take 325 mg by mouth every 6 (six) hours as needed.   allopurinol (ZYLOPRIM) 100 MG tablet Take 50 mg by mouth daily.   amLODipine (NORVASC) 10 MG tablet TAKE ONE-HALF TABLET BY MOUTH   DAILY   Ascorbic Acid (VITAMIN C) 1000 MG tablet Take 500 mg by mouth daily.   aspirin EC 81 MG tablet Take 2 tablets (162 mg total) by mouth daily.   calcium carbonate (OS-CAL) 600 MG TABS tablet Take 600 mg by mouth 2 (two) times daily with a meal.   Cholecalciferol (VITAMIN D3) 50 MCG (2000 UT) capsule Take 1 capsule (2,000 Units total) by mouth daily.   ferrous sulfate 325 (65 FE) MG tablet Take 325 mg by mouth 2 (two) times daily with a meal.   glipiZIDE (GLUCOTROL XL) 5 MG 24 hr tablet TAKE 1 TABLET BY MOUTH DAILY  WITH BREAKFAST   hydrALAZINE (APRESOLINE) 25 MG tablet TAKE 1 TABLET BY MOUTH THREE TIMES A DAY   lansoprazole (PREVACID) 15 MG capsule Take 1 capsule (15 mg total) by mouth daily at 12 noon.   nebivolol (BYSTOLIC) 10 MG tablet TAKE 1 TABLET BY MOUTH EVERY DAY   ondansetron (ZOFRAN) 4 MG tablet Take 1 tablet (4 mg total) by mouth every 8 (eight) hours as needed for nausea or vomiting.   ONETOUCH ULTRA test strip USE TO TEST BLOOD SUGAR TWICE  DAILY AS DIRECTED   potassium chloride SA (KLOR-CON M) 20 MEQ tablet Take 1 tablet (20 mEq total) by mouth daily. Annual appt due in Oct must see provider for future refills   rosuvastatin (CRESTOR) 5  MG tablet TAKE 1 TABLET (5 MG TOTAL) BY MOUTH DAILY. TAKE 1 TABLET BY MOUTH DAILY AT 6PM (Patient taking differently: Take 5 mg by mouth every other day.)   torsemide (DEMADEX) 10 MG tablet Take 2.5 tablets (25 mg total) by mouth daily.   triamcinolone cream (KENALOG) 0.1 % Apply 1 Application topically 3 (three) times daily. On rash   vitamin B-12 (CYANOCOBALAMIN) 1000 MCG tablet Take 1,000 mcg by mouth daily.   No facility-administered encounter medications on file as of 03/25/2023.    Allergies (verified) Tradjenta [linagliptin], Albuterol, Atorvastatin, Biobrane gloves large [wound dressings], Enalapril maleate, Farxiga [dapagliflozin], Hydrochlorothiazide, Kenalog [triamcinolone acetonide], Levaquin [levofloxacin], Lisinopril, Metformin  and related, Propoxyphene n-acetaminophen, Simvastatin, and Spironolactone   History: Past Medical History:  Diagnosis Date   Anemia    COPD (chronic obstructive pulmonary disease) (HCC)    Diabetes mellitus    Hyperlipidemia    Hypertension    Ischemic cardiomyopathy 06/11/2013   Low back pain    NSTEMI (non-ST elevated myocardial infarction) (HCC) 06/11/2013   Obesity    Personal history of colon cancer    Renal insufficiency    Past Surgical History:  Procedure Laterality Date   COLECTOMY     INCISION AND DRAINAGE PERIRECTAL ABSCESS N/A 03/07/2020   Procedure: IRRIGATION AND DEBRIDEMENT PERINEUM  AND PANNUS ABSCESS;  Surgeon: Harriette Bouillon, MD;  Location: [facility] OR;  Service: General;  Laterality: N/A;   IR RADIOLOGIST EVAL & MGMT  08/06/2020   LEFT HEART CATHETERIZATION WITH CORONARY ANGIOGRAM N/A 06/09/2013   Procedure: LEFT HEART CATHETERIZATION WITH CORONARY ANGIOGRAM;  Surgeon: Alvia Grove, MD;  Location: [facility] CATH LAB;  Service: Cardiovascular;  Laterality: N/A;   Family History  Problem Relation Age of Onset   Hypertension Other    Breast cancer Neg Hx    Social History   Socioeconomic History   Marital status: Single    Spouse name: Not on file   Number of children: Not on file   Years of education: Not on file   Highest education level: Not on file  Occupational History   Not on file  Tobacco Use   Smoking status: Light Smoker    Types: Cigarettes   Smokeless tobacco: Never  Vaping Use   Vaping status: Never Used  Substance and Sexual Activity   Alcohol use: No   Drug use: No   Sexual activity: Not Currently    Birth control/protection: Post-menopausal  Other Topics Concern   Not on file  Social History Narrative   Not on file   Social Determinants of Health   Financial Resource Strain: Low Risk  (03/25/2023)   Overall Financial Resource Strain (CARDIA)    Difficulty of Paying Living Expenses: Not hard at all  Food Insecurity: No Food Insecurity  (03/25/2023)   Hunger Vital Sign    Worried About Running Out of Food in the Last Year: Never true    Ran Out of Food in the Last Year: Never true  Transportation Needs: No Transportation Needs (03/25/2023)   PRAPARE - Administrator, Civil Service (Medical): No    Lack of Transportation (Non-Medical): No  Physical Activity: Insufficiently Active (03/25/2023)   Exercise Vital Sign    Days of Exercise per Week: 2 days    Minutes of Exercise per Session: 10 min  Stress: No Stress Concern Present (03/25/2023)   [employer] of Occupational Health - Occupational Stress Questionnaire    Feeling of Stress : Not at all  Social Connections: Moderately Integrated (03/25/2023)   Social Connection and Isolation Panel [NHANES]    Frequency of Communication with Friends and Family: More than three times a week    Frequency of Social Gatherings with Friends and Family: More than three times a week    Attends Religious Services: More than 4 times per year    Active Member of [employer] or Organizations: Yes    Attends Banker Meetings: More than 4 times per year    Marital Status: Widowed    Tobacco Counseling Ready to quit: No Counseling given: Yes   Clinical Intake:  Pre-visit preparation completed: Yes  Pain : No/denies pain     BMI - recorded: 33.84 Nutritional Status: BMI > 30  Obese Nutritional Risks: None Diabetes: Yes CBG done?: No Did pt. bring in CBG monitor from home?: No  How often do you need to have someone help you when you read instructions, pamphlets, or other written materials from your doctor or pharmacy?: 1 - Never  Interpreter Needed?: No  Information entered by :: Theresa Mulligan LPN   Activities of Daily Living    03/25/2023    9:16 AM 04/10/2022    2:40 PM  In your present state of health, do you have any difficulty performing the following activities:  Hearing? 0 0  Vision? 0 0  Difficulty concentrating or making decisions? 0 0   Walking or climbing stairs? 0 0  Dressing or bathing? 0 0  Doing errands, shopping? 0 0  Preparing Food and eating ? N N  Using the Toilet? N N  In the past six months, have you accidently leaked urine? N N  Do you have problems with loss of bowel control? N N  Managing your Medications? N N  Managing your Finances? N N  Housekeeping or managing your Housekeeping? N N    Patient Care Team: [name], Georgina Quint, MD as PCP - General Nahser, Deloris Ping, MD as PCP - Cardiology (Cardiology) Hilarie Fredrickson, MD as Consulting Physician (Gastroenterology) Bufford Buttner, MD as Consulting Physician (Nephrology) Carlus Pavlov, MD as Consulting Physician (Internal Medicine) Szabat, Vinnie Level, [facility] (Inactive) as Pharmacist (Pharmacist)  Indicate any recent Medical Services you may have received from other than [facility] providers in the past year (date may be approximate).     Assessment:   This is a routine wellness examination for Grecia.  Hearing/Vision screen Hearing Screening - Comments:: Denies hearing difficulties   Vision Screening - Comments:: Wears rx glasses - up to date with routine eye exams with  Dr Allena Katz   Goals Addressed               This Visit's Progress     Increase physical activity (pt-stated)         Depression Screen    03/25/2023    9:15 AM 01/25/2023   10:57 AM 10/26/2022   11:01 AM 08/27/2022   11:03 AM 11/25/2021    1:29 PM 10/09/2020    1:28 PM 10/09/2020    1:27 PM  PHQ 2/9 Scores  PHQ - 2 Score 0 0 0 0 0 0 0  PHQ- 9 Score 0 0 0  2 0     Fall Risk    03/25/2023    9:17 AM 01/25/2023   10:57 AM 10/26/2022   11:01 AM 08/27/2022   11:03 AM 04/10/2022    2:37 PM  Fall Risk   Falls in the past year? 0 0 0 0 0  Number falls in past yr: 0 0 0 0 0  Injury with Fall? 0 0 0 0 0  Risk for fall due to : No Fall Risks  No Fall Risks No Fall Risks No Fall Risks  Follow up Falls prevention discussed Falls evaluation completed  Falls evaluation completed Falls  prevention discussed    MEDICARE RISK AT HOME: Medicare Risk at Home Any stairs in or around the home?: No If so, are there any without handrails?: No Home free of loose throw rugs in walkways, pet beds, electrical cords, etc?: Yes Adequate lighting in your home to reduce risk of falls?: Yes Life alert?: No Use of a cane, walker or w/c?: No Grab bars in the bathroom?: No Shower chair or bench in shower?: Yes Elevated toilet seat or a handicapped toilet?: No  TIMED UP AND GO:  Was the test performed?  No    Cognitive Function:        03/25/2023    9:18 AM 04/10/2022    2:41 PM  6CIT Screen  What Year? 0 points 0 points  What month? 0 points 0 points  What time? 0 points 0 points  Count back from 20 0 points 0 points  Months in reverse 0 points 0 points  Repeat phrase 0 points 0 points  Total Score 0 points 0 points    Immunizations There is no immunization history for the selected administration types on file for this patient.  TDAP status: Due, Education has been provided regarding the importance of this vaccine. Advised may receive this vaccine at local pharmacy or Health Dept. Aware to provide a copy of the vaccination record if obtained from local pharmacy or Health Dept. Verbalized acceptance and understanding.    Pneumococcal vaccine status: Due, Education has been provided regarding the importance of this vaccine. Advised may receive this vaccine at local pharmacy or Health Dept. Aware to provide a copy of the vaccination record if obtained from local pharmacy or Health Dept. Verbalized acceptance and understanding.    Qualifies for Shingles Vaccine? Yes   Zostavax completed No   Shingrix Completed?: No.    Education has been provided regarding the importance of this vaccine. Patient has been advised to call insurance company to determine out of pocket expense if they have not yet received this vaccine. Advised may also receive vaccine at local pharmacy or Health  Dept. Verbalized acceptance and understanding.  Screening Tests Health Maintenance  Topic Date Due   Pneumonia Vaccine 14+ Years old (1 of 2 - PCV) Never done   Hepatitis C Screening  Never done   DTaP/Tdap/Td (1 - Tdap) Never done   Zoster Vaccines- Shingrix (1 of 2) Never done   Colonoscopy  10/26/2023 (Originally 09/30/2022)   FOOT EXAM  09/11/2023   HEMOGLOBIN A1C  09/12/2023   Diabetic kidney evaluation - Urine ACR  10/09/2023   OPHTHALMOLOGY EXAM  01/13/2024   Diabetic kidney evaluation - eGFR measurement  02/12/2024   MAMMOGRAM  03/11/2024   Medicare Annual Wellness (AWV)  03/24/2024   DEXA SCAN  Completed   HPV VACCINES  Aged Out   INFLUENZA VACCINE  Discontinued   COVID-19 Vaccine  Discontinued    Health Maintenance  Health Maintenance Due  Topic Date Due   Pneumonia Vaccine 74+ Years old (1 of 2 - PCV) Never done   Hepatitis C Screening  Never done   DTaP/Tdap/Td (1 - Tdap) Never done   Zoster Vaccines- Shingrix (1 of 2) Never done  Colorectal cancer screening: Type of screening: Colonoscopy. Completed 09/29/17. Repeat every 5 years  Mammogram status: Completed 03/12/23. Repeat every year  Bone Density status: Completed 12/19/18. Results reflect: Bone density results: NORMAL. Repeat every   years.  Lung Cancer Screening: (Low Dose CT Chest recommended if Age 63-80 years, 20 pack-year currently smoking OR have quit w/in 15years.) does qualify.   Lung Cancer Screening Referral: Deferred  Additional Screening:  Hepatitis C Screening:  qualify;  Deferred  Vision Screening: Recommended annual ophthalmology exams for early detection of glaucoma and other disorders of the eye. Is the patient up to date with their annual eye exam?  Yes  Who is the provider or what is the name of the office in which the patient attends annual eye exams? Dr Allena Katz If pt is not established with a provider, would they like to be referred to a provider to establish care? No .   Dental  Screening: Recommended annual dental exams for proper oral hygiene  Diabetic Foot Exam: Diabetic Foot Exam: Completed 09/11/22  Community Resource Referral / Chronic Care Management:  CRR required this visit?  No   CCM required this visit?  No     Plan:     I have personally reviewed and noted the following in the patient's chart:   Medical and social history Use of alcohol, tobacco or illicit drugs  Current medications and supplements including opioid prescriptions. Patient is not currently taking opioid prescriptions. Functional ability and status Nutritional status Physical activity Advanced directives List of other physicians Hospitalizations, surgeries, and ER visits in previous 12 months Vitals Screenings to include cognitive, depression, and falls Referrals and appointments  In addition, I have reviewed and discussed with patient certain preventive protocols, quality metrics, and best practice recommendations. A written personalized care plan for preventive services as well as general preventive health recommendations were provided to patient.     Tillie Rung, LPN   [id number]   After Visit Summary: (MyChart) Due to this being a telephonic visit, the after visit summary with patients personalized plan was offered to patient via MyChart   Nurse Notes: None   Medical screening examination/treatment/procedure(s) were performed by non-physician practitioner and as supervising physician I was immediately available for consultation/collaboration.  I agree with above. Jacinta Shoe, MD

## 2023-04-15 DIAGNOSIS — H04123 Dry eye syndrome of bilateral lacrimal glands: Secondary | ICD-10-CM | POA: Diagnosis not present

## 2023-04-15 DIAGNOSIS — H35033 Hypertensive retinopathy, bilateral: Secondary | ICD-10-CM | POA: Diagnosis not present

## 2023-04-15 DIAGNOSIS — H31093 Other chorioretinal scars, bilateral: Secondary | ICD-10-CM | POA: Diagnosis not present

## 2023-04-15 DIAGNOSIS — H3582 Retinal ischemia: Secondary | ICD-10-CM | POA: Diagnosis not present

## 2023-04-15 DIAGNOSIS — E113513 Type 2 diabetes mellitus with proliferative diabetic retinopathy with macular edema, bilateral: Secondary | ICD-10-CM | POA: Diagnosis not present

## 2023-04-20 ENCOUNTER — Other Ambulatory Visit: Payer: Self-pay | Admitting: Internal Medicine

## 2023-04-21 ENCOUNTER — Other Ambulatory Visit: Payer: Self-pay | Admitting: Internal Medicine

## 2023-04-27 ENCOUNTER — Encounter: Payer: Self-pay | Admitting: Internal Medicine

## 2023-04-27 ENCOUNTER — Ambulatory Visit: Payer: 59 | Admitting: Internal Medicine

## 2023-04-27 VITALS — BP 118/60 | HR 71 | Temp 98.6°F | Ht 62.0 in | Wt 182.0 lb

## 2023-04-27 DIAGNOSIS — G4700 Insomnia, unspecified: Secondary | ICD-10-CM | POA: Diagnosis not present

## 2023-04-27 DIAGNOSIS — N184 Chronic kidney disease, stage 4 (severe): Secondary | ICD-10-CM

## 2023-04-27 DIAGNOSIS — D509 Iron deficiency anemia, unspecified: Secondary | ICD-10-CM

## 2023-04-27 DIAGNOSIS — E1122 Type 2 diabetes mellitus with diabetic chronic kidney disease: Secondary | ICD-10-CM | POA: Diagnosis not present

## 2023-04-27 DIAGNOSIS — E1159 Type 2 diabetes mellitus with other circulatory complications: Secondary | ICD-10-CM

## 2023-04-27 DIAGNOSIS — E1165 Type 2 diabetes mellitus with hyperglycemia: Secondary | ICD-10-CM | POA: Diagnosis not present

## 2023-04-27 MED ORDER — NITROGLYCERIN 0.4 MG SL SUBL
0.4000 mg | SUBLINGUAL_TABLET | SUBLINGUAL | 3 refills | Status: AC | PRN
Start: 2023-04-27 — End: ?

## 2023-04-27 NOTE — Assessment & Plan Note (Signed)
F/u w/Dr Upton 

## 2023-04-27 NOTE — Progress Notes (Signed)
Subjective:  Patient ID: [name], female    DOB: March 28, 1951  Age: 72 y.o. MRN: [id number]  CC: Medical Management of Chronic Issues (3 MNTH F/U, Pt has stated since having her stress test done x1 month ago she has been having trouble with sleeping at night and finds ger self sleeping during the day a lot more.)   HPI [name] presents for insomnia that started after her stress test on 9/16 (per pt).   Getting up to pee 3 times.  No new meds  No CP, no GERD per pt OA pains, LBP - OK  Outpatient Medications Prior to Visit  Medication Sig Dispense Refill  . acetaminophen (TYLENOL) 325 MG tablet Take 325 mg by mouth every 6 (six) hours as needed.    [name] allopurinol (ZYLOPRIM) 100 MG tablet Take 50 mg by mouth daily.    [name] amLODipine (NORVASC) 10 MG tablet TAKE ONE-HALF TABLET BY MOUTH  DAILY 50 tablet 2  . Ascorbic Acid (VITAMIN C) 1000 MG tablet Take 500 mg by mouth daily.    [name] aspirin EC 81 MG tablet Take 2 tablets (162 mg total) by mouth daily.    . calcium carbonate (OS-CAL) 600 MG TABS tablet Take 600 mg by mouth 2 (two) times daily with a meal.    . Cholecalciferol (VITAMIN D3) 50 MCG (2000 UT) capsule Take 1 capsule (2,000 Units total) by mouth daily. 100 capsule 3  . ferrous sulfate 325 (65 FE) MG tablet Take 325 mg by mouth 2 (two) times daily with a meal.    . glipiZIDE (GLUCOTROL XL) 5 MG 24 hr tablet TAKE 1 TABLET BY MOUTH DAILY  WITH BREAKFAST 100 tablet 2  . hydrALAZINE (APRESOLINE) 25 MG tablet TAKE 1 TABLET BY MOUTH THREE TIMES A DAY 270 tablet 3  . lansoprazole (PREVACID) 15 MG capsule Take 1 capsule (15 mg total) by mouth daily at 12 noon. 90 capsule 3  . nebivolol (BYSTOLIC) 10 MG tablet TAKE 1 TABLET BY MOUTH EVERY DAY 90 tablet 3  . ondansetron (ZOFRAN) 4 MG tablet Take 1 tablet (4 mg total) by mouth every 8 (eight) hours as needed for nausea or vomiting. 20 tablet 0  . ONETOUCH ULTRA test strip USE TO TEST BLOOD SUGAR TWICE  DAILY AS  DIRECTED 200 strip 2  . potassium chloride SA (KLOR-CON M) 20 MEQ tablet TAKE 1 TABLET BY MOUTH DAILY 100 tablet 2  . rosuvastatin (CRESTOR) 5 MG tablet TAKE 1 TABLET (5 MG TOTAL) BY MOUTH DAILY. TAKE 1 TABLET BY MOUTH DAILY AT 6PM (Patient taking differently: Take 5 mg by mouth every other day.) 90 tablet 2  . torsemide (DEMADEX) 10 MG tablet Take 2.5 tablets (25 mg total) by mouth daily. 225 tablet 3  . triamcinolone cream (KENALOG) 0.1 % Apply 1 Application topically 3 (three) times daily. On rash 160 g 3  . vitamin B-12 (CYANOCOBALAMIN) 1000 MCG tablet Take 1,000 mcg by mouth daily.     No facility-administered medications prior to visit.    ROS: Review of Systems  Constitutional:  Negative for activity change, appetite change, chills, fatigue and unexpected weight change.  HENT:  Negative for congestion, mouth sores and sinus pressure.   Eyes:  Negative for visual disturbance.  Respiratory:  Negative for cough and chest tightness.   Gastrointestinal:  Negative for abdominal pain and nausea.  Genitourinary:  Negative for difficulty urinating, frequency and vaginal pain.  Musculoskeletal:  Positive for back pain. Negative for gait  problem.  Skin:  Negative for pallor and rash.  Neurological:  Negative for dizziness, tremors, weakness, numbness and headaches.  Psychiatric/Behavioral:  Positive for sleep disturbance. Negative for confusion and suicidal ideas. The patient is not nervous/anxious.     Objective:  BP 118/60 (BP Location: Left Arm, Patient Position: Sitting, Cuff Size: Normal)   Pulse 71   Temp 98.6 F (37 C) (Oral)   Ht 5\' 2"  (1.575 m)   Wt 182 lb (82.6 kg)   LMP  (LMP Unknown)   SpO2 97%   BMI 33.29 kg/m   BP Readings from Last 3 Encounters:  04/27/23 118/60  03/15/23 118/70  03/01/23 128/64    Wt Readings from Last 3 Encounters:  04/27/23 182 lb (82.6 kg)  03/25/23 185 lb (83.9 kg)  03/22/23 182 lb (82.6 kg)    Physical Exam Constitutional:       General: She is not in acute distress.    Appearance: She is well-developed.  HENT:     Head: Normocephalic.     Right Ear: External ear normal.     Left Ear: External ear normal.     Nose: Nose normal.  Eyes:     General:        Right eye: No discharge.        Left eye: No discharge.     Conjunctiva/sclera: Conjunctivae normal.     Pupils: Pupils are equal, round, and reactive to light.  Neck:     Thyroid: No thyromegaly.     Vascular: No JVD.     Trachea: No tracheal deviation.  Cardiovascular:     Rate and Rhythm: Normal rate and regular rhythm.     Heart sounds: Normal heart sounds.  Pulmonary:     Effort: No respiratory distress.     Breath sounds: No stridor. No wheezing.  Abdominal:     General: Bowel sounds are normal. There is no distension.     Palpations: Abdomen is soft. There is no mass.     Tenderness: There is no abdominal tenderness. There is no guarding or rebound.  Musculoskeletal:        General: No tenderness.     Cervical back: Normal range of motion and neck supple. No rigidity.  Lymphadenopathy:     Cervical: No cervical adenopathy.  Skin:    Findings: No erythema or rash.  Neurological:     Cranial Nerves: No cranial nerve deficit.     Motor: No abnormal muscle tone.     Coordination: Coordination normal.     Deep Tendon Reflexes: Reflexes normal.  Psychiatric:        Behavior: Behavior normal.        Thought Content: Thought content normal.        Judgment: Judgment normal.    Lab Results  Component Value Date   WBC 6.4 02/12/2023   HGB 10.0 (L) 02/12/2023   HCT 33.0 (L) 02/12/2023   PLT 131 (L) 02/12/2023   GLUCOSE 127 (H) 02/12/2023   CHOL 129 12/16/2021   TRIG 78 12/16/2021   HDL 49 12/16/2021   LDLCALC 65 12/16/2021   ALT 11 12/16/2021   AST 14 07/17/2021   NA 139 02/12/2023   K 4.0 02/12/2023   CL 108 02/12/2023   CREATININE 2.88 (H) 02/12/2023   BUN 89 (H) 02/12/2023   CO2 18 (L) 02/12/2023   TSH 1.51 07/17/2021   INR 1.1  06/17/2020   HGBA1C 7.0 (A) 03/15/2023   MICROALBUR 13.9 (H)  10/13/2007    MM 3D SCREENING MAMMOGRAM BILATERAL BREAST  Result Date: 03/15/2023 CLINICAL DATA:  Screening. EXAM: DIGITAL SCREENING BILATERAL MAMMOGRAM WITH TOMOSYNTHESIS AND CAD TECHNIQUE: Bilateral screening digital craniocaudal and mediolateral oblique mammograms were obtained. Bilateral screening digital breast tomosynthesis was performed. The images were evaluated with computer-aided detection. COMPARISON:  Previous exam(s). ACR Breast Density Category a: The breasts are almost entirely fatty. FINDINGS: There are no findings suspicious for malignancy. IMPRESSION: No mammographic evidence of malignancy. A result letter of this screening mammogram will be mailed directly to the patient. RECOMMENDATION: Screening mammogram in one year. (Code:SM-B-01Y) BI-RADS CATEGORY  1: Negative. Electronically Signed   By: Harmon Pier M.D.   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   Getting up to pee 3 times.  No new meds  No CP, no GERD, no OSA per pt OA pains, LBP - OK  Pt declined Rx Start Melatonin or Tylenol PM  prn         Meds ordered this encounter  Medications  . nitroGLYCERIN (NITROSTAT) 0.4 MG SL tablet    Sig: Place 1 tablet (0.4 mg total) under the tongue every 5 (five) minutes as needed for chest pain.    Dispense:  20 tablet    Refill:  3      Follow-up: Return in about 3 months (around 07/28/2023) for a follow-up visit.  Sonda Primes, MD

## 2023-04-27 NOTE — Assessment & Plan Note (Signed)
F/u w/Endo

## 2023-04-27 NOTE — Assessment & Plan Note (Signed)
CBC w/Nephrology

## 2023-04-27 NOTE — Assessment & Plan Note (Addendum)
New: insomnia that started after her stress test on 9/16 (per pt).   Getting up to pee 3 times.  No new meds  No CP, no GERD, no OSA per pt OA pains, LBP - OK  Pt declined Rx Start Melatonin or Tylenol PM  prn

## 2023-04-27 NOTE — Patient Instructions (Addendum)
Start Melatonin or Tylenol PM  Short nap

## 2023-06-02 ENCOUNTER — Other Ambulatory Visit: Payer: Self-pay | Admitting: Internal Medicine

## 2023-06-25 DIAGNOSIS — Z720 Tobacco use: Secondary | ICD-10-CM | POA: Diagnosis not present

## 2023-06-25 DIAGNOSIS — D631 Anemia in chronic kidney disease: Secondary | ICD-10-CM | POA: Diagnosis not present

## 2023-06-25 DIAGNOSIS — N2581 Secondary hyperparathyroidism of renal origin: Secondary | ICD-10-CM | POA: Diagnosis not present

## 2023-06-25 DIAGNOSIS — N184 Chronic kidney disease, stage 4 (severe): Secondary | ICD-10-CM | POA: Diagnosis not present

## 2023-06-25 DIAGNOSIS — N189 Chronic kidney disease, unspecified: Secondary | ICD-10-CM | POA: Diagnosis not present

## 2023-06-25 DIAGNOSIS — I82409 Acute embolism and thrombosis of unspecified deep veins of unspecified lower extremity: Secondary | ICD-10-CM | POA: Diagnosis not present

## 2023-06-25 DIAGNOSIS — I129 Hypertensive chronic kidney disease with stage 1 through stage 4 chronic kidney disease, or unspecified chronic kidney disease: Secondary | ICD-10-CM | POA: Diagnosis not present

## 2023-06-25 DIAGNOSIS — E1122 Type 2 diabetes mellitus with diabetic chronic kidney disease: Secondary | ICD-10-CM | POA: Diagnosis not present

## 2023-07-28 ENCOUNTER — Ambulatory Visit: Payer: 59 | Admitting: Internal Medicine

## 2023-08-02 DIAGNOSIS — E113513 Type 2 diabetes mellitus with proliferative diabetic retinopathy with macular edema, bilateral: Secondary | ICD-10-CM | POA: Diagnosis not present

## 2023-08-02 DIAGNOSIS — H35071 Retinal telangiectasis, right eye: Secondary | ICD-10-CM | POA: Diagnosis not present

## 2023-08-02 DIAGNOSIS — H3582 Retinal ischemia: Secondary | ICD-10-CM | POA: Diagnosis not present

## 2023-08-02 DIAGNOSIS — H2513 Age-related nuclear cataract, bilateral: Secondary | ICD-10-CM | POA: Diagnosis not present

## 2023-08-02 DIAGNOSIS — H59813 Chorioretinal scars after surgery for detachment, bilateral: Secondary | ICD-10-CM | POA: Diagnosis not present

## 2023-08-10 ENCOUNTER — Ambulatory Visit (INDEPENDENT_AMBULATORY_CARE_PROVIDER_SITE_OTHER): Payer: 59

## 2023-08-10 ENCOUNTER — Ambulatory Visit: Payer: 59 | Admitting: Internal Medicine

## 2023-08-10 ENCOUNTER — Encounter: Payer: Self-pay | Admitting: Internal Medicine

## 2023-08-10 VITALS — BP 118/62 | HR 72 | Temp 98.8°F | Ht 62.0 in | Wt 184.0 lb

## 2023-08-10 DIAGNOSIS — M545 Low back pain, unspecified: Secondary | ICD-10-CM

## 2023-08-10 DIAGNOSIS — N184 Chronic kidney disease, stage 4 (severe): Secondary | ICD-10-CM | POA: Diagnosis not present

## 2023-08-10 DIAGNOSIS — E1122 Type 2 diabetes mellitus with diabetic chronic kidney disease: Secondary | ICD-10-CM | POA: Diagnosis not present

## 2023-08-10 DIAGNOSIS — R600 Localized edema: Secondary | ICD-10-CM | POA: Diagnosis not present

## 2023-08-10 DIAGNOSIS — Z7984 Long term (current) use of oral hypoglycemic drugs: Secondary | ICD-10-CM | POA: Diagnosis not present

## 2023-08-10 DIAGNOSIS — M79672 Pain in left foot: Secondary | ICD-10-CM | POA: Diagnosis not present

## 2023-08-10 DIAGNOSIS — E1159 Type 2 diabetes mellitus with other circulatory complications: Secondary | ICD-10-CM

## 2023-08-10 DIAGNOSIS — M4316 Spondylolisthesis, lumbar region: Secondary | ICD-10-CM | POA: Diagnosis not present

## 2023-08-10 DIAGNOSIS — M48061 Spinal stenosis, lumbar region without neurogenic claudication: Secondary | ICD-10-CM | POA: Diagnosis not present

## 2023-08-10 DIAGNOSIS — M549 Dorsalgia, unspecified: Secondary | ICD-10-CM | POA: Diagnosis not present

## 2023-08-10 DIAGNOSIS — M79671 Pain in right foot: Secondary | ICD-10-CM | POA: Diagnosis not present

## 2023-08-10 DIAGNOSIS — I152 Hypertension secondary to endocrine disorders: Secondary | ICD-10-CM | POA: Diagnosis not present

## 2023-08-10 DIAGNOSIS — M47816 Spondylosis without myelopathy or radiculopathy, lumbar region: Secondary | ICD-10-CM | POA: Diagnosis not present

## 2023-08-10 MED ORDER — METHYLPREDNISOLONE 4 MG PO TBPK: ORAL_TABLET | ORAL | 0 refills | Status: DC

## 2023-08-10 MED ORDER — HYDROCODONE-ACETAMINOPHEN 5-325 MG PO TABS: 1.0000 | ORAL_TABLET | Freq: Four times a day (QID) | ORAL | 0 refills | Status: DC | PRN

## 2023-08-10 NOTE — Assessment & Plan Note (Signed)
F/u w/Dr Signe Colt Off Amlodipine now

## 2023-08-10 NOTE — Assessment & Plan Note (Signed)
Podiatry ref Pt is asking for diabetic shoes

## 2023-08-10 NOTE — Assessment & Plan Note (Signed)
Off Amlodipine now

## 2023-08-10 NOTE — Progress Notes (Signed)
 Subjective:  Patient ID: [name], female    DOB: 04-24-1951  Age: 73 y.o. MRN: [id number]  CC: Medical Management of Chronic Issues (3 mnth f/u)   HPI Uri Covey presents for LBP after moving her bed x 1 month 7/10 today; DM, HTN  Off Amlodipine   Outpatient Medications Prior to Visit  Medication Sig Dispense Refill   acetaminophen  (TYLENOL ) 325 MG tablet Take 325 mg by mouth every 6 (six) hours as needed.     amLODipine  (NORVASC ) 10 MG tablet TAKE ONE-HALF TABLET BY MOUTH  DAILY 50 tablet 2   Ascorbic Acid (VITAMIN C) 1000 MG tablet Take 500 mg by mouth daily.     aspirin  EC 81 MG tablet Take 2 tablets (162 mg total) by mouth daily.     calcium  carbonate (OS-CAL) 600 MG TABS tablet Take 600 mg by mouth 2 (two) times daily with a meal.     Cholecalciferol (VITAMIN D3) 50 MCG (2000 UT) capsule Take 1 capsule (2,000 Units total) by mouth daily. 100 capsule 3   ferrous sulfate  325 (65 FE) MG tablet Take 325 mg by mouth 2 (two) times daily with a meal.     glipiZIDE  (GLUCOTROL  XL) 5 MG 24 hr tablet TAKE 1 TABLET BY MOUTH DAILY  WITH BREAKFAST 100 tablet 2   hydrALAZINE  (APRESOLINE ) 25 MG tablet TAKE 1 TABLET BY MOUTH THREE TIMES A DAY 270 tablet 3   lansoprazole  (PREVACID ) 15 MG capsule Take 1 capsule (15 mg total) by mouth daily at 12 noon. 90 capsule 3   nebivolol  (BYSTOLIC ) 10 MG tablet TAKE 1 TABLET BY MOUTH EVERY DAY 90 tablet 3   nitroGLYCERIN  (NITROSTAT ) 0.4 MG SL tablet Place 1 tablet (0.4 mg total) under the tongue every 5 (five) minutes as needed for chest pain. 20 tablet 3   ondansetron  (ZOFRAN ) 4 MG tablet Take 1 tablet (4 mg total) by mouth every 8 (eight) hours as needed for nausea or vomiting. 20 tablet 0   ONETOUCH ULTRA test strip USE TO TEST BLOOD SUGAR TWICE  DAILY AS DIRECTED 200 strip 2   potassium chloride  SA (KLOR-CON  M) 20 MEQ tablet TAKE 1 TABLET BY MOUTH DAILY 100 tablet 2   rosuvastatin  (CRESTOR ) 5 MG tablet TAKE 1 TABLET (5 MG TOTAL) BY  MOUTH DAILY. TAKE 1 TABLET BY MOUTH DAILY AT 6PM (Patient taking differently: Take 5 mg by mouth every other day.) 90 tablet 2   torsemide  (DEMADEX ) 10 MG tablet Take 2.5 tablets (25 mg total) by mouth daily. 225 tablet 3   triamcinolone  cream (KENALOG ) 0.1 % Apply 1 Application topically 3 (three) times daily. On rash 160 g 3   vitamin B-12 (CYANOCOBALAMIN) 1000 MCG tablet Take 1,000 mcg by mouth daily.     allopurinol (ZYLOPRIM) 100 MG tablet Take 50 mg by mouth daily.     No facility-administered medications prior to visit.    ROS: Review of Systems  Constitutional:  Negative for activity change, appetite change, chills, fatigue and unexpected weight change.  HENT:  Negative for congestion, mouth sores and sinus pressure.   Eyes:  Negative for visual disturbance.  Respiratory:  Negative for cough and chest tightness.   Gastrointestinal:  Negative for abdominal pain and nausea.  Genitourinary:  Negative for difficulty urinating, frequency and vaginal pain.  Musculoskeletal:  Positive for back pain and gait problem.  Skin:  Negative for pallor and rash.  Neurological:  Negative for dizziness, tremors, weakness, numbness and headaches.  Psychiatric/Behavioral:  Negative  for confusion and sleep disturbance.     Objective:  BP 118/62 (BP Location: Left Arm, Patient Position: Sitting, Cuff Size: Normal)   Pulse 72   Temp 98.8 F (37.1 C) (Oral)   Ht 5' 2 (1.575 m)   Wt 184 lb (83.5 kg)   LMP  (LMP Unknown)   SpO2 97%   BMI 33.65 kg/m   BP Readings from Last 3 Encounters:  08/10/23 118/62  04/27/23 118/60  03/15/23 118/70    Wt Readings from Last 3 Encounters:  08/10/23 184 lb (83.5 kg)  04/27/23 182 lb (82.6 kg)  03/25/23 185 lb (83.9 kg)    Physical Exam Constitutional:      General: She is not in acute distress.    Appearance: She is well-developed. She is obese.  HENT:     Head: Normocephalic.     Right Ear: External ear normal.     Left Ear: External ear  normal.     Nose: Nose normal.  Eyes:     General:        Right eye: No discharge.        Left eye: No discharge.     Conjunctiva/sclera: Conjunctivae normal.     Pupils: Pupils are equal, round, and reactive to light.  Neck:     Thyroid : No thyromegaly.     Vascular: No JVD.     Trachea: No tracheal deviation.  Cardiovascular:     Rate and Rhythm: Normal rate and regular rhythm.     Heart sounds: Normal heart sounds.  Pulmonary:     Effort: No respiratory distress.     Breath sounds: No stridor. No wheezing.  Abdominal:     General: Bowel sounds are normal. There is no distension.     Palpations: Abdomen is soft. There is no mass.     Tenderness: There is no abdominal tenderness. There is no guarding or rebound.  Musculoskeletal:        General: No tenderness.     Cervical back: Normal range of motion and neck supple. No rigidity.  Lymphadenopathy:     Cervical: No cervical adenopathy.  Skin:    Findings: No erythema or rash.  Neurological:     Cranial Nerves: No cranial nerve deficit.     Motor: No abnormal muscle tone.     Coordination: Coordination normal.     Deep Tendon Reflexes: Reflexes normal.  Psychiatric:        Behavior: Behavior normal.        Thought Content: Thought content normal.        Judgment: Judgment normal.   LS w/pain  Lab Results  Component Value Date   WBC 6.4 02/12/2023   HGB 10.0 (L) 02/12/2023   HCT 33.0 (L) 02/12/2023   PLT 131 (L) 02/12/2023   GLUCOSE 127 (H) 02/12/2023   CHOL 129 12/16/2021   TRIG 78 12/16/2021   HDL 49 12/16/2021   LDLCALC 65 12/16/2021   ALT 11 12/16/2021   AST 14 07/17/2021   NA 139 02/12/2023   K 4.0 02/12/2023   CL 108 02/12/2023   CREATININE 2.88 (H) 02/12/2023   BUN 89 (H) 02/12/2023   CO2 18 (L) 02/12/2023   TSH 1.51 07/17/2021   INR 1.1 06/17/2020   HGBA1C 7.0 (A) 03/15/2023   MICROALBUR 13.9 (H) 10/13/2007    MM 3D SCREENING MAMMOGRAM BILATERAL BREAST Result Date: 03/15/2023 CLINICAL DATA:   Screening. EXAM: DIGITAL SCREENING BILATERAL MAMMOGRAM WITH TOMOSYNTHESIS AND CAD TECHNIQUE: Bilateral  screening digital craniocaudal and mediolateral oblique mammograms were obtained. Bilateral screening digital breast tomosynthesis was performed. The images were evaluated with computer-aided detection. COMPARISON:  Previous exam(s). ACR Breast Density Category a: The breasts are almost entirely fatty. FINDINGS: There are no findings suspicious for malignancy. IMPRESSION: No mammographic evidence of malignancy. A result letter of this screening mammogram will be mailed directly to the patient. RECOMMENDATION: Screening mammogram in one year. (Code:SM-B-01Y) BI-RADS CATEGORY  1: Negative. Electronically Signed   By: Reyes Phi M.D.   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 addiction risk, apnea etc) and complications (i.e. Somnolence, constipation and others) were explained to the patient and were aknowledged.       Relevant Medications   HYDROcodone -acetaminophen  (NORCO) 5-325 MG tablet   methylPREDNISolone  (MEDROL  DOSEPAK) 4 MG TBPK tablet   Other Relevant Orders   Ambulatory referral to Podiatry   DG Lumbar Spine 2-3 Views   Leg edema   Off Amlodipine  now      Foot pain, bilateral   Podiatry ref Pt is asking for diabetic shoes          Meds ordered this encounter  Medications   HYDROcodone -acetaminophen  (NORCO) 5-325 MG tablet    Sig: Take 1 tablet by mouth every 6 (six) hours as needed for moderate pain (pain score 4-6).     Dispense:  20 tablet    Refill:  0   methylPREDNISolone  (MEDROL  DOSEPAK) 4 MG TBPK tablet    Sig: As directed    Dispense:  21 tablet    Refill:  0      Follow-up: No follow-ups on file.  Marolyn Noel, MD

## 2023-08-10 NOTE — Assessment & Plan Note (Signed)
 New  LBP after moving her bed x 1 month 7/10 toda R/o compr fx: X ray LS spine Medrol  pack Norco prn  Potential benefits of a short/long term opioids use as well as potential risks (i.e. addiction risk, apnea etc) and complications (i.e. Somnolence, constipation and others) were explained to the patient and were aknowledged.

## 2023-08-18 ENCOUNTER — Ambulatory Visit (INDEPENDENT_AMBULATORY_CARE_PROVIDER_SITE_OTHER): Payer: 59 | Admitting: Podiatry

## 2023-08-18 DIAGNOSIS — L909 Atrophic disorder of skin, unspecified: Secondary | ICD-10-CM | POA: Diagnosis not present

## 2023-08-18 DIAGNOSIS — M2141 Flat foot [pes planus] (acquired), right foot: Secondary | ICD-10-CM | POA: Diagnosis not present

## 2023-08-18 DIAGNOSIS — M79675 Pain in left toe(s): Secondary | ICD-10-CM | POA: Diagnosis not present

## 2023-08-18 DIAGNOSIS — E1142 Type 2 diabetes mellitus with diabetic polyneuropathy: Secondary | ICD-10-CM

## 2023-08-18 DIAGNOSIS — M2142 Flat foot [pes planus] (acquired), left foot: Secondary | ICD-10-CM

## 2023-08-18 DIAGNOSIS — M79674 Pain in right toe(s): Secondary | ICD-10-CM | POA: Diagnosis not present

## 2023-08-18 DIAGNOSIS — B351 Tinea unguium: Secondary | ICD-10-CM

## 2023-08-18 NOTE — Progress Notes (Signed)
  Subjective:  Patient ID: [name], female    DOB: Aug 25, 1950,   MRN: [id number]  No chief complaint on file.   73 y.o. female presents for concern of thickened elongated and painful nails that are difficult to trim. Requesting to have them trimmed today. Relates burning and tingling in their feet. Patient is diabetic and last A1c was  Lab Results  Component Value Date   HGBA1C 7.0 (A) 03/15/2023   .  Relates pain in her feet and wondering about diabetic shoes  PCP:  [name], Georgina Quint, MD    . Denies any other pedal complaints. Denies n/v/f/c.   Past Medical History:  Diagnosis Date   Anemia    COPD (chronic obstructive pulmonary disease) (HCC)    Diabetes mellitus    Hyperlipidemia    Hypertension    Ischemic cardiomyopathy 06/11/2013   Low back pain    NSTEMI (non-ST elevated myocardial infarction) (HCC) 06/11/2013   Obesity    Personal history of colon cancer    Renal insufficiency     Objective:  Physical Exam: Vascular: DP/PT pulses 2/4 bilateral. CFT <3 seconds. Absent hair growth on digits. Edema noted to bilateral lower extremities. Xerosis noted bilaterally.  Skin. No lacerations or abrasions bilateral feet. Nails 1-5 bilateral  are thickened discolored and elongated with subungual debris.  Musculoskeletal: MMT 5/5 bilateral lower extremities in DF, PF, Inversion and Eversion. Deceased ROM in DF of ankle joint. Pes planus noted bilateral with diminished fat pad to ball and heel of foot bilateral.  Neurological: Sensation intact to light touch. Protective sensation diminished bilateral.    Assessment:   1. Type 2 diabetes mellitus with peripheral neuropathy (HCC)   2. Pain due to onychomycosis of toenails of both feet   3. Bilateral pes planus   4. Fat pad atrophy of foot      Plan:  Patient was evaluated and treated and all questions answered. -Discussed and educated patient on diabetic foot care, especially with  regards to the vascular,  neurological and musculoskeletal systems.  -Stressed the importance of good glycemic control and the detriment of not  controlling glucose levels in relation to the foot. -Discussed supportive shoes at all times and checking feet regularly.  -Mechanically debrided all nails 1-5 bilateral using sterile nail nipper and filed with dremel without incident  -Answered all patient questions -DM shoes ordered -Patient to return  in 3 months for at risk foot care -Patient advised to call the office if any problems or questions arise in the meantime.   Louann Sjogren, DPM

## 2023-08-23 ENCOUNTER — Other Ambulatory Visit: Payer: Self-pay | Admitting: Internal Medicine

## 2023-09-01 DIAGNOSIS — E113511 Type 2 diabetes mellitus with proliferative diabetic retinopathy with macular edema, right eye: Secondary | ICD-10-CM | POA: Diagnosis not present

## 2023-09-08 ENCOUNTER — Ambulatory Visit: Payer: 59 | Admitting: Internal Medicine

## 2023-09-08 ENCOUNTER — Encounter: Payer: Self-pay | Admitting: Internal Medicine

## 2023-09-08 VITALS — BP 118/62 | HR 127 | Temp 98.0°F | Ht 62.0 in | Wt 187.0 lb

## 2023-09-08 DIAGNOSIS — G8929 Other chronic pain: Secondary | ICD-10-CM | POA: Diagnosis not present

## 2023-09-08 DIAGNOSIS — I152 Hypertension secondary to endocrine disorders: Secondary | ICD-10-CM

## 2023-09-08 DIAGNOSIS — N184 Chronic kidney disease, stage 4 (severe): Secondary | ICD-10-CM

## 2023-09-08 DIAGNOSIS — E1159 Type 2 diabetes mellitus with other circulatory complications: Secondary | ICD-10-CM | POA: Diagnosis not present

## 2023-09-08 DIAGNOSIS — E1122 Type 2 diabetes mellitus with diabetic chronic kidney disease: Secondary | ICD-10-CM

## 2023-09-08 DIAGNOSIS — I2510 Atherosclerotic heart disease of native coronary artery without angina pectoris: Secondary | ICD-10-CM

## 2023-09-08 DIAGNOSIS — M5440 Lumbago with sciatica, unspecified side: Secondary | ICD-10-CM | POA: Diagnosis not present

## 2023-09-08 DIAGNOSIS — I5042 Chronic combined systolic (congestive) and diastolic (congestive) heart failure: Secondary | ICD-10-CM

## 2023-09-08 DIAGNOSIS — I110 Hypertensive heart disease with heart failure: Secondary | ICD-10-CM | POA: Diagnosis not present

## 2023-09-08 DIAGNOSIS — M25551 Pain in right hip: Secondary | ICD-10-CM

## 2023-09-08 MED ORDER — HYDROCODONE-ACETAMINOPHEN 5-325 MG PO TABS: 1.0000 | ORAL_TABLET | Freq: Four times a day (QID) | ORAL | 0 refills | Status: DC | PRN

## 2023-09-08 NOTE — Assessment & Plan Note (Signed)
 Recurrent Norco prn  Potential benefits of a short/long term opioids use as well as potential risks (i.e. addiction risk, apnea etc) and complications (i.e. Somnolence, constipation and others) were explained to the patient and were aknowledged.

## 2023-09-08 NOTE — Assessment & Plan Note (Signed)
BP is nl now 

## 2023-09-08 NOTE — Assessment & Plan Note (Signed)
Norco prn  Potential benefits of a long term opioids use as well as potential risks (i.e. addiction risk, apnea etc) and complications (i.e. Somnolence, constipation and others) were explained to the patient and were aknowledged. 

## 2023-09-08 NOTE — Progress Notes (Signed)
 Subjective:  Patient ID: [name], female    DOB: Nov 09, 1950  Age: 73 y.o. MRN: [id number]  CC: Medical Management of Chronic Issues (4 mnth f/u)   HPI Minola Guin presents for LBP f/u - better C/o R hip pain now 6/10 F/u on HTN, CRF  Outpatient Medications Prior to Visit  Medication Sig Dispense Refill   acetaminophen (TYLENOL) 325 MG tablet Take 325 mg by mouth every 6 (six) hours as needed.     allopurinol (ZYLOPRIM) 100 MG tablet Take 100 mg by mouth daily.     amLODipine (NORVASC) 10 MG tablet TAKE ONE-HALF TABLET BY MOUTH  DAILY 50 tablet 2   Ascorbic Acid (VITAMIN C) 1000 MG tablet Take 500 mg by mouth daily.     aspirin EC 81 MG tablet Take 2 tablets (162 mg total) by mouth daily.     calcium carbonate (OS-CAL) 600 MG TABS tablet Take 600 mg by mouth 2 (two) times daily with a meal.     Cholecalciferol (VITAMIN D3) 50 MCG (2000 UT) capsule Take 1 capsule (2,000 Units total) by mouth daily. 100 capsule 3   ferrous sulfate 325 (65 FE) MG tablet Take 325 mg by mouth 2 (two) times daily with a meal.     glipiZIDE (GLUCOTROL XL) 5 MG 24 hr tablet TAKE 1 TABLET BY MOUTH DAILY  WITH BREAKFAST 100 tablet 2   hydrALAZINE (APRESOLINE) 25 MG tablet TAKE 1 TABLET BY MOUTH THREE TIMES A DAY 270 tablet 3   lansoprazole (PREVACID) 15 MG capsule Take 1 capsule (15 mg total) by mouth daily at 12 noon. 90 capsule 3   methylPREDNISolone (MEDROL DOSEPAK) 4 MG TBPK tablet As directed 21 tablet 0   nebivolol (BYSTOLIC) 10 MG tablet TAKE 1 TABLET BY MOUTH EVERY DAY 90 tablet 3   nitroGLYCERIN (NITROSTAT) 0.4 MG SL tablet Place 1 tablet (0.4 mg total) under the tongue every 5 (five) minutes as needed for chest pain. 20 tablet 3   ondansetron (ZOFRAN) 4 MG tablet Take 1 tablet (4 mg total) by mouth every 8 (eight) hours as needed for nausea or vomiting. 20 tablet 0   ONETOUCH ULTRA test strip USE TO TEST BLOOD SUGAR TWICE  DAILY AS DIRECTED 200 strip 2   potassium chloride SA  (KLOR-CON M) 20 MEQ tablet TAKE 1 TABLET BY MOUTH DAILY 100 tablet 2   rosuvastatin (CRESTOR) 5 MG tablet TAKE 1 TABLET (5 MG TOTAL) BY MOUTH DAILY. TAKE 1 TABLET BY MOUTH DAILY AT 6PM (Patient taking differently: Take 5 mg by mouth every other day.) 90 tablet 2   torsemide (DEMADEX) 10 MG tablet Take 2.5 tablets (25 mg total) by mouth daily. 225 tablet 3   triamcinolone cream (KENALOG) 0.1 % Apply 1 Application topically 3 (three) times daily. On rash 160 g 3   vitamin B-12 (CYANOCOBALAMIN) 1000 MCG tablet Take 1,000 mcg by mouth daily.     HYDROcodone-acetaminophen (NORCO) 5-325 MG tablet Take 1 tablet by mouth every 6 (six) hours as needed for moderate pain (pain score 4-6). 20 tablet 0   No facility-administered medications prior to visit.    ROS: Review of Systems  Constitutional:  Negative for activity change, appetite change, chills, fatigue and unexpected weight change.  HENT:  Negative for congestion, mouth sores and sinus pressure.   Eyes:  Negative for visual disturbance.  Respiratory:  Negative for cough and chest tightness.   Gastrointestinal:  Negative for abdominal pain and nausea.  Genitourinary:  Negative  for difficulty urinating, frequency and vaginal pain.  Musculoskeletal:  Positive for arthralgias, back pain and gait problem.  Skin:  Negative for pallor and rash.  Neurological:  Negative for dizziness, tremors, weakness, numbness and headaches.  Psychiatric/Behavioral:  Negative for confusion, sleep disturbance and suicidal ideas. The patient is not nervous/anxious.     Objective:  BP 118/62   Pulse (!) 127   Temp 98 F (36.7 C) (Oral)   Ht 5\' 2"  (1.575 m)   Wt 187 lb (84.8 kg)   LMP  (LMP Unknown)   SpO2 97%   BMI 34.20 kg/m   BP Readings from Last 3 Encounters:  09/08/23 118/62  08/10/23 118/62  04/27/23 118/60    Wt Readings from Last 3 Encounters:  09/08/23 187 lb (84.8 kg)  08/10/23 184 lb (83.5 kg)  04/27/23 182 lb (82.6 kg)    Physical  Exam Constitutional:      General: She is not in acute distress.    Appearance: She is well-developed. She is obese.  HENT:     Head: Normocephalic.     Right Ear: External ear normal.     Left Ear: External ear normal.     Nose: Nose normal.  Eyes:     General:        Right eye: No discharge.        Left eye: No discharge.     Conjunctiva/sclera: Conjunctivae normal.     Pupils: Pupils are equal, round, and reactive to light.  Neck:     Thyroid: No thyromegaly.     Vascular: No JVD.     Trachea: No tracheal deviation.  Cardiovascular:     Rate and Rhythm: Normal rate and regular rhythm.     Heart sounds: Normal heart sounds.  Pulmonary:     Effort: No respiratory distress.     Breath sounds: No stridor. No wheezing.  Abdominal:     General: Bowel sounds are normal. There is no distension.     Palpations: Abdomen is soft. There is no mass.     Tenderness: There is no abdominal tenderness. There is no guarding or rebound.  Musculoskeletal:        General: Tenderness present.     Cervical back: Normal range of motion and neck supple. No rigidity.  Lymphadenopathy:     Cervical: No cervical adenopathy.  Skin:    Findings: No erythema or rash.  Neurological:     Mental Status: She is oriented to person, place, and time.     Cranial Nerves: No cranial nerve deficit.     Motor: No abnormal muscle tone.     Coordination: Coordination normal.     Gait: Gait abnormal.     Deep Tendon Reflexes: Reflexes normal.  Psychiatric:        Behavior: Behavior normal.        Thought Content: Thought content normal.        Judgment: Judgment normal.     Lab Results  Component Value Date   WBC 6.4 02/12/2023   HGB 10.0 (L) 02/12/2023   HCT 33.0 (L) 02/12/2023   PLT 131 (L) 02/12/2023   GLUCOSE 127 (H) 02/12/2023   CHOL 129 12/16/2021   TRIG 78 12/16/2021   HDL 49 12/16/2021   LDLCALC 65 12/16/2021   ALT 11 12/16/2021   AST 14 07/17/2021   NA 139 02/12/2023   K 4.0 02/12/2023    CL 108 02/12/2023   CREATININE 2.88 (H) 02/12/2023  BUN 89 (H) 02/12/2023   CO2 18 (L) 02/12/2023   TSH 1.51 07/17/2021   INR 1.1 06/17/2020   HGBA1C 7.0 (A) 03/15/2023   MICROALBUR 13.9 (H) 10/13/2007    MM 3D SCREENING MAMMOGRAM BILATERAL BREAST Result Date: 03/15/2023 CLINICAL DATA:  Screening. EXAM: DIGITAL SCREENING BILATERAL MAMMOGRAM WITH TOMOSYNTHESIS AND CAD TECHNIQUE: Bilateral screening digital craniocaudal and mediolateral oblique mammograms were obtained. Bilateral screening digital breast tomosynthesis was performed. The images were evaluated with computer-aided detection. COMPARISON:  Previous exam(s). ACR Breast Density Category a: The breasts are almost entirely fatty. FINDINGS: There are no findings suspicious for malignancy. IMPRESSION: No mammographic evidence of malignancy. A result letter of this screening mammogram will be mailed directly to the patient. RECOMMENDATION: Screening mammogram in one year. (Code:SM-B-01Y) BI-RADS CATEGORY  1: Negative. Electronically Signed   By: Harmon Pier M.D.   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 addiction risk, apnea etc) and complications (i.e. Somnolence, constipation and others) were explained to the patient and were aknowledged.       Relevant Medications   HYDROcodone-acetaminophen (NORCO) 5-325 MG tablet   Hypertensive heart disease with congestive heart failure (HCC)   BP is nl now      Hip pain, chronic, right -  Primary   Norco prn  Potential benefits of a long term opioids use as well as potential risks (i.e. addiction risk, apnea etc) and complications (i.e. Somnolence, constipation and others) were explained to the patient and were aknowledged.       Relevant Medications   HYDROcodone-acetaminophen (NORCO) 5-325 MG tablet      Meds ordered this encounter  Medications   HYDROcodone-acetaminophen (NORCO) 5-325 MG tablet    Sig: Take 1 tablet by mouth every 6 (six) hours as needed for moderate pain (pain score 4-6).    Dispense:  60 tablet    Refill:  0      Follow-up: Return in about 3 months (around 12/09/2023) for a follow-up visit.  Sonda Primes, MD

## 2023-09-08 NOTE — Assessment & Plan Note (Signed)
 Continue with Bystolic, Hydralazine, Torsemide Off Rx due to low BP

## 2023-09-08 NOTE — Assessment & Plan Note (Signed)
 On Aspirin, Crestor

## 2023-09-08 NOTE — Patient Instructions (Addendum)
 USEFUL THINGS FOR ARTHRITIS and musculoskeletal pains:    A "rice sock heating pad" refers to a homemade heating pad created by filling a sock with uncooked rice, which can be heated in a microwave to provide a warm compress for sore muscles, pain relief, or other applications; essentially, it's a simple way to generate heat using readily available materials.  Key points about rice sock heat: How to make it: Fill a clean sock (preferably a tube sock) about 2/3 full with uncooked rice, tie a knot at the top to secure the rice inside.  Heating it up: Place the rice sock in the microwave and heat in short intervals (usually around 30 seconds at a time) until it reaches the desired warmth.  Important considerations: Check temperature before applying: Always test the temperature of the rice sock before applying it to your skin to avoid burns.  Use a towel to protect skin: Wrap the rice sock in a thin towel to distribute the heat evenly and protect your skin.  Uses: Muscle aches and pains  Menstrual cramps  Neck pain  Arthritis discomfort   SILICONE PADS: Use them to open jars and bottles    BRIX JAR OPENER: Use them to open jars    NITRILE COATED GARDEN GLOVES: Use down to open jars, lift boxes, etc.   THUMB BRACE: Use it for thumb arthritis flareup pain     BLUE EMU CREAM: Use it 2-3 times a day on painful areas     Florastor probiotic for diarrhea

## 2023-09-20 ENCOUNTER — Ambulatory Visit: Payer: 59 | Admitting: Internal Medicine

## 2023-09-20 ENCOUNTER — Ambulatory Visit: Payer: 59

## 2023-09-20 DIAGNOSIS — L909 Atrophic disorder of skin, unspecified: Secondary | ICD-10-CM

## 2023-09-20 DIAGNOSIS — E1142 Type 2 diabetes mellitus with diabetic polyneuropathy: Secondary | ICD-10-CM

## 2023-09-20 DIAGNOSIS — M2141 Flat foot [pes planus] (acquired), right foot: Secondary | ICD-10-CM

## 2023-09-20 DIAGNOSIS — B351 Tinea unguium: Secondary | ICD-10-CM

## 2023-09-20 NOTE — Progress Notes (Signed)
 Patient presents to the office today for diabetic shoe and insole measuring.  Patient was measured with brannock device to determine size and width for 1 pair of extra depth shoes and foam casted for 3 pair of insoles.   Documentation of medical necessity will be sent to patient's treating diabetic doctor to verify and sign.   Patient's diabetic provider: Inda Castle MD   Shoes and insoles will be ordered at that time and patient will be notified for an appointment for fitting when they arrive.   Shoe size (per patient): 8 Brannock measurement: 8 Shoe choice:   [id number] ./ [id number] Shoe size ordered:   ABN / Ppw signed

## 2023-10-20 ENCOUNTER — Ambulatory Visit: Payer: 59 | Admitting: Internal Medicine

## 2023-10-20 ENCOUNTER — Encounter: Payer: Self-pay | Admitting: Internal Medicine

## 2023-10-20 VITALS — BP 130/80 | HR 78 | Ht 62.0 in | Wt 187.4 lb

## 2023-10-20 DIAGNOSIS — E785 Hyperlipidemia, unspecified: Secondary | ICD-10-CM | POA: Diagnosis not present

## 2023-10-20 DIAGNOSIS — E049 Nontoxic goiter, unspecified: Secondary | ICD-10-CM | POA: Diagnosis not present

## 2023-10-20 DIAGNOSIS — E1165 Type 2 diabetes mellitus with hyperglycemia: Secondary | ICD-10-CM

## 2023-10-20 DIAGNOSIS — E1159 Type 2 diabetes mellitus with other circulatory complications: Secondary | ICD-10-CM | POA: Diagnosis not present

## 2023-10-20 DIAGNOSIS — Z7984 Long term (current) use of oral hypoglycemic drugs: Secondary | ICD-10-CM | POA: Diagnosis not present

## 2023-10-20 LAB — POCT GLYCOSYLATED HEMOGLOBIN (HGB A1C): Hemoglobin A1C: 6.5 % — AB (ref 4.0–5.6)

## 2023-10-20 NOTE — Patient Instructions (Addendum)
 Please continue: - Glipizide ER 5 mg before breakfast  Discuss with Dr. Christianne Cowper if we can use Farxiga or Jardiance.  Please return in 4-6 months with your sugar log.

## 2023-10-20 NOTE — Progress Notes (Signed)
 Patient ID: [name], female   DOB: Sep 11, 1950, 73 y.o.   MRN: [id number]  HPI: [name] is a 73 y.o.-year-old female, returning for f/u DM2, dx in 2006, insulin-independent, uncontrolled, with complications (CAD - s/p AMI 06/2013, CHF; CKD; PN; DR). Last visit 7 months ago.  Interim history: No increased urination, blurry vision, nausea, chest pain. Last year, her sugars increased to 400s after getting steroids for left foot gout.  She was recently on Prednisone for back pain 2 mo ago >> sugars higher, up to 200s for 1 week. Now on Hydrocodone as needed.  Reviewed HbA1c levels: Lab Results  Component Value Date   HGBA1C 7.0 (A) 03/15/2023   HGBA1C 6.9 (A) 09/11/2022   HGBA1C 7.1 (A) 03/12/2022  05/12/2021: HbA1c calculated from fructosamine is: 7.35%, lower than the directly measured HbA1c. 05/16/2020: HbA1c calculated from fructosamine is: 7.4%, lower than the directly measured HbA1c. 05/02/2019: HbA1c calculated from fructosamine was 7.0% 12/30/2018: HbA1c calculated from fructosamine is higher, at 8.2%, possibly 2/2 drinking juice 08/24/2018: HbA1c calculated from fructosamine is higher, at 8%, possibly 2/2 ABx  04/20/2018: HbA1c calculated from fructosamine is slightly better than before, at 7% 12/15/2017: HbA1c calculated from fructosamine: 7.17%, slightly improved from last visit  08/17/2017: HbA1c calculated from fructosamine: 7.2%, slightly improved 04/15/2017: HbA1c calculated from the fructosamine is lower than measured, at 7.3% 08/21/2016: HbA1c 6.0%. She started to change her diet after her hemoglobin A1c returned high, at 16.2% in 09/2014. She cut down portions, changed the meal content to include more fiber and less carbs.  HbA1c decreased dramatically.  Pt is on: - Glipizide ER 5 5 mg before breakfast (10 mg during steroid treatment) >> 5-10 >> 5 mg in a.m. She stopped metformin ER in 01/2017. She tried Tradjenta 5 mg daily in am >> CP with it  (started 10/2014) >> had to stop She tried [place] >> decreased [name] and yeast inf. She tried regular metformin >> diarrhea She refused insulin in the past.  Pt checks her sugars twice a day per review of her log: - am:  70, 79-128, 136, 138 >> 77, 84-121, 127 >> 92-136 >> 90-120 - 2h after b'fast: 122-139 >> 129-144 >> 130-140 >> 114 >> n/c  - before lunch: 125 >> 89, 121-151 >> 103-141, 158 >> n/c - 2h after lunch: 169 >> 120-130 >> n/c  - before dinner:  110-144 >> 118-130 >> n/c - 2h after dinner: 70, 120-144, 147 >> 89-148 >> 127-150s - bedtime: 137 >> n/c >> 147-157 >> n/c - nighttime: n/c Lowest sugar was   90 >> 77>> 90; it is unclear at which level she has hypoglycemia awareness. Highest sugar was 400 (Prednisone) >> .Aaron Aas. 147 >> 150.  Glucometer: One Touch Ultra  Pt's meals are: - Breakfast: oatmeal, cereals, [place] bacon, egg toast; cinnamon on oatmeal or cheerios - Lunch: sandwich or fruit salad - Dinner: salmon/chicken/turkey, Brussel sprouts, other veggies, sweet potatoes or brown rice, spaghetti  - Snacks: 2: carrots with dip; yoghurt with fruit, veggies + dip; fruit  She walks for exercise. She saw Dr. Christianne Cowper with nephrology.  She was started on a high potassium, low protein diet  -+ CKD-sees nephrology (Dr. Christianne Cowper); latest BUN/creatinine:  Lab Results  Component Value Date   BUN 89 (H) 02/12/2023   CREATININE 2.88 (H) 02/12/2023  06/10/2022: Protein/creatinine ratio 259 (0-200) 02/05/2022: 66/2.72, [name] 18, glucose 136, protein to creatinine ratio 105 (0-200), ACR 37 (0-29) 08/09/2020: 68/2.15, [name] 26, glucose 145, protein to  creatinine ratio 68, ACR 9 She stopped olmesartan due to worsening kidney function and cough.  -+ HL; last set of lipids: Lab Results  Component Value Date   CHOL 129 12/16/2021   HDL 49 12/16/2021   LDLCALC 65 12/16/2021   TRIG 78 12/16/2021   CHOLHDL 2.6 12/16/2021  On Crestor 5 daily.  - last eye exam was in 01/13/2023: + DR, + cataract  reportedly.  Had laser surgeries. Dr. Lydia Sams.   - she has numbness and tingling in her feet.  Last foot exam 08/18/2023 by Dr. Sikora. She has a callus and also generalized pain in B feet. Needs Diabetic shoes.  She also has a history of HTN, GERD, and anemia. In 02/2020, she was admitted multiple perineal abscesses and cellulitis.  These were I&D and she was started on antibiotics. She was in [facility] for 2 weeks - she did not like her care there, including the meals. Of note, patient discussed with nephrology about dialysis and she would never want to proceed with this. She was started on Eliquis after a traumatic DVT in 11/2021.   Goiter: A thyroid ultrasound from 2016 showed only small cysts. A CT scan (02/12/2023): nonnodular goiter  Pt denies: - feeling nodules in neck - hoarseness - dysphagia - choking  Latest TSH normal: Lab Results  Component Value Date   TSH 1.51 07/17/2021   ROS: + see HPI  I reviewed pt's medications, allergies, PMH, social hx, family hx, and changes were documented in the history of present illness. Otherwise, unchanged from my initial visit note.  Past Medical History:  Diagnosis Date   Anemia    COPD (chronic obstructive pulmonary disease) (HCC)    Diabetes mellitus    Hyperlipidemia    Hypertension    Ischemic cardiomyopathy 06/11/2013   Low back pain    NSTEMI (non-ST elevated myocardial infarction) (HCC) 06/11/2013   Obesity    Personal history of colon cancer    Renal insufficiency    Past Surgical History:  Procedure Laterality Date   COLECTOMY     INCISION AND DRAINAGE PERIRECTAL ABSCESS N/A 03/07/2020   Procedure: IRRIGATION AND DEBRIDEMENT PERINEUM  AND PANNUS ABSCESS;  Surgeon: Sim Dryer, MD;  Location: [facility] OR;  Service: General;  Laterality: N/A;   IR RADIOLOGIST EVAL & MGMT  08/06/2020   LEFT HEART CATHETERIZATION WITH CORONARY ANGIOGRAM N/A 06/09/2013   Procedure: LEFT HEART CATHETERIZATION WITH CORONARY ANGIOGRAM;   Surgeon: Patrici Boom, MD;  Location: [facility] CATH LAB;  Service: Cardiovascular;  Laterality: N/A;   History   Social History   Marital Status: Single    Spouse Name: N/A   Number of Children: 0   Occupational History   retired   Social History Main Topics   Smoking status:  former smoker     Types: Cigarettes   Smokeless tobacco: Never Used   Alcohol Use: No   Drug Use: No   Current Outpatient Medications on File Prior to Visit  Medication Sig Dispense Refill   acetaminophen (TYLENOL) 325 MG tablet Take 325 mg by mouth every 6 (six) hours as needed.     allopurinol (ZYLOPRIM) 100 MG tablet Take 100 mg by mouth daily.     amLODipine (NORVASC) 10 MG tablet TAKE ONE-HALF TABLET BY MOUTH  DAILY 50 tablet 2   Ascorbic Acid (VITAMIN C) 1000 MG tablet Take 500 mg by mouth daily.     aspirin EC 81 MG tablet Take 2 tablets (162 mg total) by  mouth daily.     calcium carbonate (OS-CAL) 600 MG TABS tablet Take 600 mg by mouth 2 (two) times daily with a meal.     Cholecalciferol (VITAMIN D3) 50 MCG (2000 UT) capsule Take 1 capsule (2,000 Units total) by mouth daily. 100 capsule 3   ferrous sulfate 325 (65 FE) MG tablet Take 325 mg by mouth 2 (two) times daily with a meal.     glipiZIDE (GLUCOTROL XL) 5 MG 24 hr tablet TAKE 1 TABLET BY MOUTH DAILY  WITH BREAKFAST 100 tablet 2   hydrALAZINE (APRESOLINE) 25 MG tablet TAKE 1 TABLET BY MOUTH THREE TIMES A DAY 270 tablet 3   HYDROcodone-acetaminophen (NORCO) 5-325 MG tablet Take 1 tablet by mouth every 6 (six) hours as needed for moderate pain (pain score 4-6). 60 tablet 0   lansoprazole (PREVACID) 15 MG capsule Take 1 capsule (15 mg total) by mouth daily at 12 noon. 90 capsule 3   methylPREDNISolone (MEDROL DOSEPAK) 4 MG TBPK tablet As directed 21 tablet 0   nebivolol (BYSTOLIC) 10 MG tablet TAKE 1 TABLET BY MOUTH EVERY DAY 90 tablet 3   nitroGLYCERIN (NITROSTAT) 0.4 MG SL tablet Place 1 tablet (0.4 mg total) under the tongue every 5 (five)  minutes as needed for chest pain. 20 tablet 3   ondansetron (ZOFRAN) 4 MG tablet Take 1 tablet (4 mg total) by mouth every 8 (eight) hours as needed for nausea or vomiting. 20 tablet 0   ONETOUCH ULTRA test strip USE TO TEST BLOOD SUGAR TWICE  DAILY AS DIRECTED 200 strip 2   potassium chloride SA (KLOR-CON M) 20 MEQ tablet TAKE 1 TABLET BY MOUTH DAILY 100 tablet 2   rosuvastatin (CRESTOR) 5 MG tablet TAKE 1 TABLET (5 MG TOTAL) BY MOUTH DAILY. TAKE 1 TABLET BY MOUTH DAILY AT 6PM (Patient taking differently: Take 5 mg by mouth every other day.) 90 tablet 2   torsemide (DEMADEX) 10 MG tablet Take 2.5 tablets (25 mg total) by mouth daily. 225 tablet 3   triamcinolone cream (KENALOG) 0.1 % Apply 1 Application topically 3 (three) times daily. On rash 160 g 3   vitamin B-12 (CYANOCOBALAMIN) 1000 MCG tablet Take 1,000 mcg by mouth daily.     No current facility-administered medications on file prior to visit.   Allergies  Allergen Reactions   Tradjenta [Linagliptin] Anaphylaxis    CP   Albuterol     Palpitations   Atorvastatin Other (See Comments)    Patient unsure of allergy.   Biobrane Gloves Large [Wound Dressings]    Enalapril Maleate     REACTION: cough   Farxiga [Dapagliflozin] Itching   Hydrochlorothiazide     REACTION: hair loss   Kenalog [Triamcinolone Acetonide]     HANDS NUMB    Levaquin [Levofloxacin] Hives   Lisinopril    Metformin And Related     Diarrhea, dizziness   Propoxyphene N-Acetaminophen Hives and Other (See Comments)    Patient unsure of allergy.   Simvastatin     REACTION: cramps   Spironolactone     REACTION: cramps   Family History  Problem Relation Age of Onset   Hypertension Other    Breast cancer Neg Hx    PE: BP 130/80   Pulse 78   Ht 5\' 2"  (1.575 m)   Wt 187 lb 6.4 oz (85 kg)   LMP  (LMP Unknown)   SpO2 98%   BMI 34.28 kg/m   Wt Readings from Last 3 Encounters:  10/20/23  187 lb 6.4 oz (85 kg)  09/08/23 187 lb (84.8 kg)  08/10/23 184 lb  (83.5 kg)   Constitutional: overweight, in NAD Eyes: EOMI, no exophthalmos ENT: no thyromegaly, no cervical lymphadenopathy Cardiovascular: RRR, No RG, + 1/6 SEM, + L>R LE edema Respiratory: CTA B Musculoskeletal: no deformities Skin: no rashes Neurological: no tremor with outstretched hands  ASSESSMENT: 1. DM2, insulin-independent, uncontrolled, with complications - CAD, s/p AMI 06/2013 - Dr Elease Hashimoto - CHF - CKD - PN - DR - Dr. Carmela Rima ([facility]) - on IO injections >> improving  2. Goiter - No neck compression symptoms  - 10/19/2014: thyroid ultrasound: Right thyroid lobe: 4.0 x 1.5 x 1.6 cm. Heterogeneous parenchyma with multiple small cysts identified. The largest measures approximately 0.5 x 0.3 x 0.5 cm. All of the small cysts in the right lobe appears simple and benign.  Left thyroid lobe: 4.5 x 1.4 x 2.0 cm. Heterogeneous parenchyma with multiple cysts. The largest measures approximately 1.0 x 0.4 x 0.5 cm. 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 Similar smaller cyst measures 0.8 cm in greatest diameter. There is a small solid nodule measuring 0.7 x 0.4 x 0.6 cm.  Isthmus Thickness: 0.4 cm.  No nodules visualized.  Lymphadenopathy: None visualized.            Multicystic thyroid.  3. HL  PLAN:  1. Patient with longstanding, uncontrolled, type 2 diabetes, on sulfonylurea only after she came off metformin due to diabetes control in the setting of improved diet.  She switched to a more plant-based diet and reduced processed foods and sweets.  However, afterwards, she relaxed her diet and sugars increased with an HbA1c of 7.3% at the beginning of 2024.  She started the Mediterranean diet afterwards and sugars improved again.  At last visit, HbA1c was 7.0%, only slightly higher.  Sugars were mostly at goal with only occasional very mild hyperglycemic spikes.  I did recommend to switch from sulfonylurea to an SGLT2 inhibitor but  her [name] on the latest BMP was quite low, at 17, and I advised her to check with Dr. Signe Colt, her nephrologist, to see if we can use this class of medication.  Until then, I advised her to continue glipizide. - At today's visit, I reviewed the latest nephrology note from 06/2023.  An SGLT2 inhibitor was not discussed, but in the records I saw that she previously had an intolerance to [place]: yeast infection.  She has an appointment coming up with Dr. Signe Colt and will discuss with her about possibly trying Jardiance if covered by the insurance.  I advised her that this is not necessary for her diabetes since at low [name]'s, the impact of SGLT2 inhibitors on diabetes control is minimum.  However, I am wondering if this could improve her kidney and cardiovascular outcomes. -Sugars appear to be at goal now so we discussed about continuing the same regimen of glipizide ER.  She doubled the dose when she was on steroids. -I advised her to: Patient Instructions  Please continue: - Glipizide ER 5 mg before breakfast  Discuss with Dr. Signe Colt if we can use [place] or [place].  Please return in 4-6 months with your sugar log.  - we checked her HbA1c: 6.5% (lower) - advised to check sugars at different times of the day - 1x a day, rotating check times - advised for yearly eye exams >> she is UTD - return to clinic in 4-6 months   2. Goiter -  She denies neck compression symptoms - On thyroid ultrasound she only had small thyroid cysts -She had a CT scan of her chest recently and was advised that her thyroid was enlarged.  At today's visit, we reviewed the report together.  I advised her that we are aware of the goiter, but no intervention is needed since her TFTs are normal and she does not have thyroid nodules - Latest TSH was normal: Lab Results  Component Value Date   TSH 1.51 07/17/2021  - Following her clinically for now  3. HL and 4.  Statin intolerance - Latest lipid panel from 12/2021 was reviewed:  LDL higher than our goal of less than 55, otherwise fractions at goal: Lab Results  Component Value Date   CHOL 129 12/16/2021   HDL 49 12/16/2021   LDLCALC 65 12/16/2021   TRIG 78 12/16/2021   CHOLHDL 2.6 12/16/2021  -She could not tolerate statins due to muscle cramps - She has an appointment with nephrology and she will have labs at that time.  I advised her to see Dr. Jenette Mitchell can check a lipid panel at that time also and send it to me, to minimize needle sticks.  Emilie Harden, MD PhD [facility]

## 2023-10-20 NOTE — Addendum Note (Signed)
 Addended by: Vernon Goodpasture on: 10/20/2023 11:02 AM   Modules accepted: Orders

## 2023-11-02 DIAGNOSIS — N2581 Secondary hyperparathyroidism of renal origin: Secondary | ICD-10-CM | POA: Diagnosis not present

## 2023-11-02 DIAGNOSIS — E785 Hyperlipidemia, unspecified: Secondary | ICD-10-CM | POA: Diagnosis not present

## 2023-11-02 DIAGNOSIS — Z720 Tobacco use: Secondary | ICD-10-CM | POA: Diagnosis not present

## 2023-11-02 DIAGNOSIS — N184 Chronic kidney disease, stage 4 (severe): Secondary | ICD-10-CM | POA: Diagnosis not present

## 2023-11-02 DIAGNOSIS — I82409 Acute embolism and thrombosis of unspecified deep veins of unspecified lower extremity: Secondary | ICD-10-CM | POA: Diagnosis not present

## 2023-11-02 DIAGNOSIS — M109 Gout, unspecified: Secondary | ICD-10-CM | POA: Diagnosis not present

## 2023-11-02 DIAGNOSIS — I129 Hypertensive chronic kidney disease with stage 1 through stage 4 chronic kidney disease, or unspecified chronic kidney disease: Secondary | ICD-10-CM | POA: Diagnosis not present

## 2023-11-02 DIAGNOSIS — E1122 Type 2 diabetes mellitus with diabetic chronic kidney disease: Secondary | ICD-10-CM | POA: Diagnosis not present

## 2023-11-02 DIAGNOSIS — N189 Chronic kidney disease, unspecified: Secondary | ICD-10-CM | POA: Diagnosis not present

## 2023-11-02 DIAGNOSIS — D631 Anemia in chronic kidney disease: Secondary | ICD-10-CM | POA: Diagnosis not present

## 2023-11-13 ENCOUNTER — Other Ambulatory Visit: Payer: Self-pay | Admitting: Internal Medicine

## 2023-11-22 ENCOUNTER — Telehealth: Payer: Self-pay | Admitting: Internal Medicine

## 2023-11-22 NOTE — Telephone Encounter (Signed)
 Copied from CRM [id number]. Topic: General - Other >> Nov 19, 2023  2:56 PM Lajean Pike wrote: Reason for CRM: Lu [phone] from [facility] is calling to see if the fax was received in regards to a request for a statin for patients cholesterol. Lu stated that she will resend the fax today. She also stated a callback is okay as well for any questions/concerns.

## 2023-11-30 ENCOUNTER — Encounter: Payer: Self-pay | Admitting: Podiatry

## 2023-11-30 ENCOUNTER — Ambulatory Visit (INDEPENDENT_AMBULATORY_CARE_PROVIDER_SITE_OTHER): Payer: 59 | Admitting: Podiatry

## 2023-11-30 DIAGNOSIS — L909 Atrophic disorder of skin, unspecified: Secondary | ICD-10-CM

## 2023-11-30 DIAGNOSIS — M2142 Flat foot [pes planus] (acquired), left foot: Secondary | ICD-10-CM

## 2023-11-30 DIAGNOSIS — M2141 Flat foot [pes planus] (acquired), right foot: Secondary | ICD-10-CM

## 2023-11-30 DIAGNOSIS — B351 Tinea unguium: Secondary | ICD-10-CM | POA: Diagnosis not present

## 2023-11-30 DIAGNOSIS — M79674 Pain in right toe(s): Secondary | ICD-10-CM | POA: Diagnosis not present

## 2023-11-30 DIAGNOSIS — E1142 Type 2 diabetes mellitus with diabetic polyneuropathy: Secondary | ICD-10-CM | POA: Diagnosis not present

## 2023-11-30 DIAGNOSIS — M79675 Pain in left toe(s): Secondary | ICD-10-CM

## 2023-11-30 NOTE — Progress Notes (Signed)
  Subjective:  Patient ID: [name], female    DOB: [birth year],   MRN: [id number]  Chief Complaint  Patient presents with   Diabetes    "Do my toenails."    73 y.o. female presents for concern of thickened elongated and painful nails that are difficult to trim. Requesting to have them trimmed today. Relates burning and tingling in their feet. Patient is diabetic and last A1c was  Lab Results  Component Value Date   HGBA1C 6.5 (A) 10/20/2023   .  Relates pain in her feet and wondering about diabetic shoes  PCP:  [name], Oakley Bellman, MD    . Denies any other pedal complaints. Denies n/v/f/c.   Past Medical History:  Diagnosis Date   Anemia    COPD (chronic obstructive pulmonary disease) (HCC)    Diabetes mellitus    Hyperlipidemia    Hypertension    Ischemic cardiomyopathy 06/11/2013   Low back pain    NSTEMI (non-ST elevated myocardial infarction) (HCC) 06/11/2013   Obesity    Personal history of colon cancer    Renal insufficiency     Objective:  Physical Exam: Vascular: DP/PT pulses 2/4 bilateral. CFT <3 seconds. Absent hair growth on digits. Edema noted to bilateral lower extremities. Xerosis noted bilaterally.  Skin. No lacerations or abrasions bilateral feet. Nails 1-5 bilateral  are thickened discolored and elongated with subungual debris.  Musculoskeletal: MMT 5/5 bilateral lower extremities in DF, PF, Inversion and Eversion. Deceased ROM in DF of ankle joint. Pes planus noted bilateral with diminished fat pad to ball and heel of foot bilateral.  Neurological: Sensation intact to light touch. Protective sensation diminished bilateral.    Assessment:   1. Pain due to onychomycosis of toenails of both feet   2. Type 2 diabetes mellitus with peripheral neuropathy (HCC)      Plan:  Patient was evaluated and treated and all questions answered. -Discussed and educated patient on diabetic foot care, especially with  regards to the vascular, neurological  and musculoskeletal systems.  -Stressed the importance of good glycemic control and the detriment of not  controlling glucose levels in relation to the foot. -Discussed supportive shoes at all times and checking feet regularly.  -Mechanically debrided all nails 1-5 bilateral using sterile nail nipper and filed with dremel without incident  -Answered all patient questions -Patient to return  in 3 months for at risk foot care -Patient advised to call the office if any problems or questions arise in the meantime.   Jennefer Moats, DPM

## 2023-12-01 DIAGNOSIS — H3582 Retinal ischemia: Secondary | ICD-10-CM | POA: Diagnosis not present

## 2023-12-01 DIAGNOSIS — E113513 Type 2 diabetes mellitus with proliferative diabetic retinopathy with macular edema, bilateral: Secondary | ICD-10-CM | POA: Diagnosis not present

## 2023-12-01 DIAGNOSIS — H59813 Chorioretinal scars after surgery for detachment, bilateral: Secondary | ICD-10-CM | POA: Diagnosis not present

## 2023-12-01 DIAGNOSIS — H2513 Age-related nuclear cataract, bilateral: Secondary | ICD-10-CM | POA: Diagnosis not present

## 2023-12-06 IMAGING — MG MM DIGITAL SCREENING BILAT W/ TOMO AND CAD
6 of 10 series · 6 of 30 positions shown · non-contrast
Comparison: Previous exam(s).

ACR Breast Density Category a: The breast tissue is almost entirely
fatty.

CLINICAL DATA: Screening.

EXAM:
DIGITAL SCREENING BILATERAL MAMMOGRAM WITH TOMOSYNTHESIS AND CAD
TECHNIQUE: Bilateral screening digital craniocaudal and mediolateral oblique
mammograms were obtained. Bilateral screening digital breast
tomosynthesis was performed. The images were evaluated with
computer-aided detection.

[R MLO synth-2D]
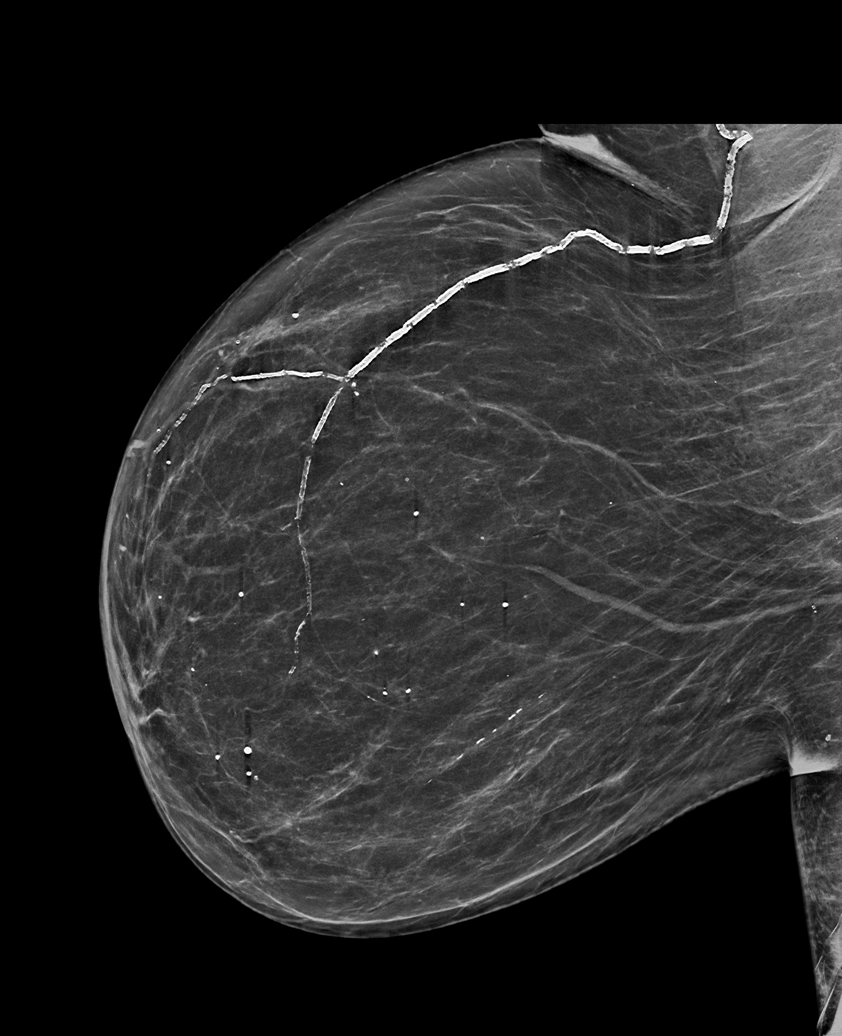

[L CC synth-2D (1 of 2)]
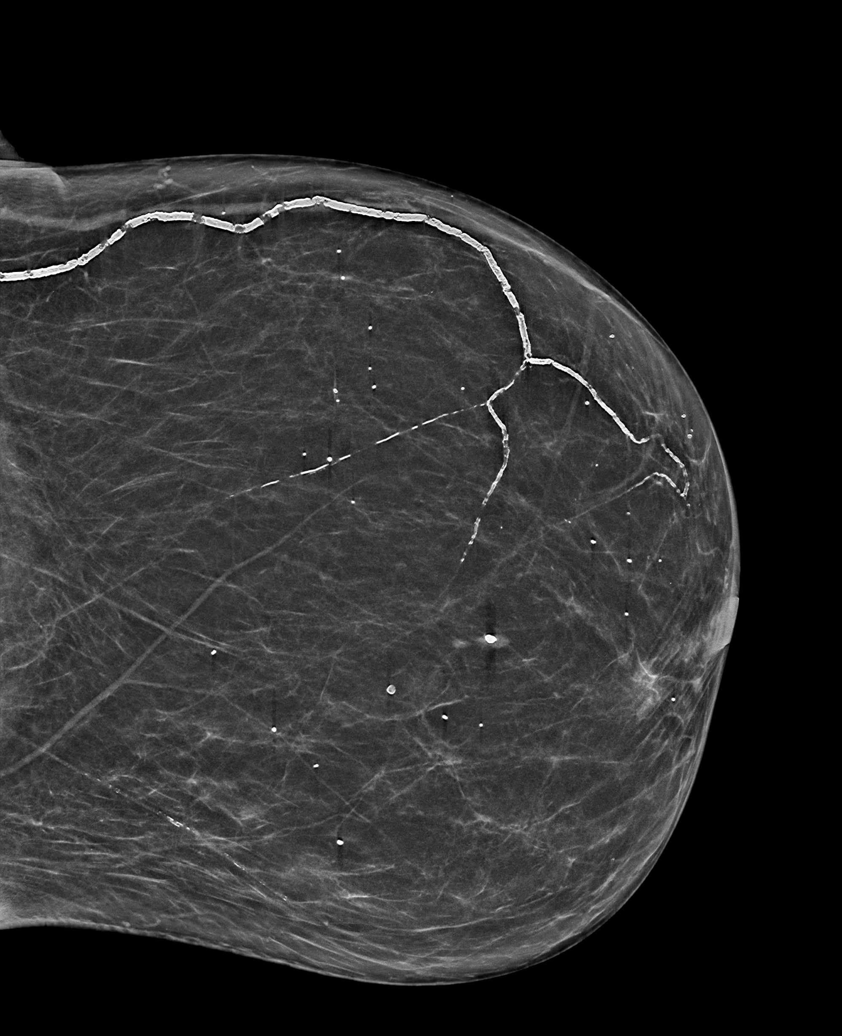

[L MLO synth-2D]
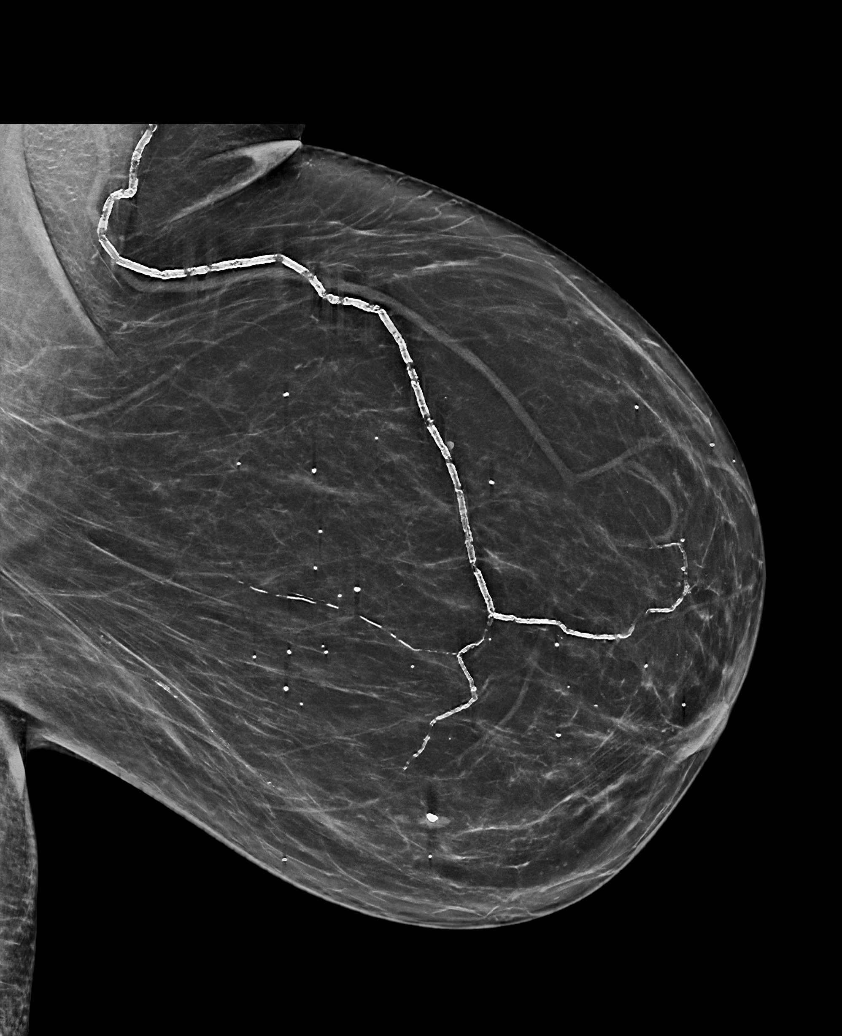

[R CC synth-2D]
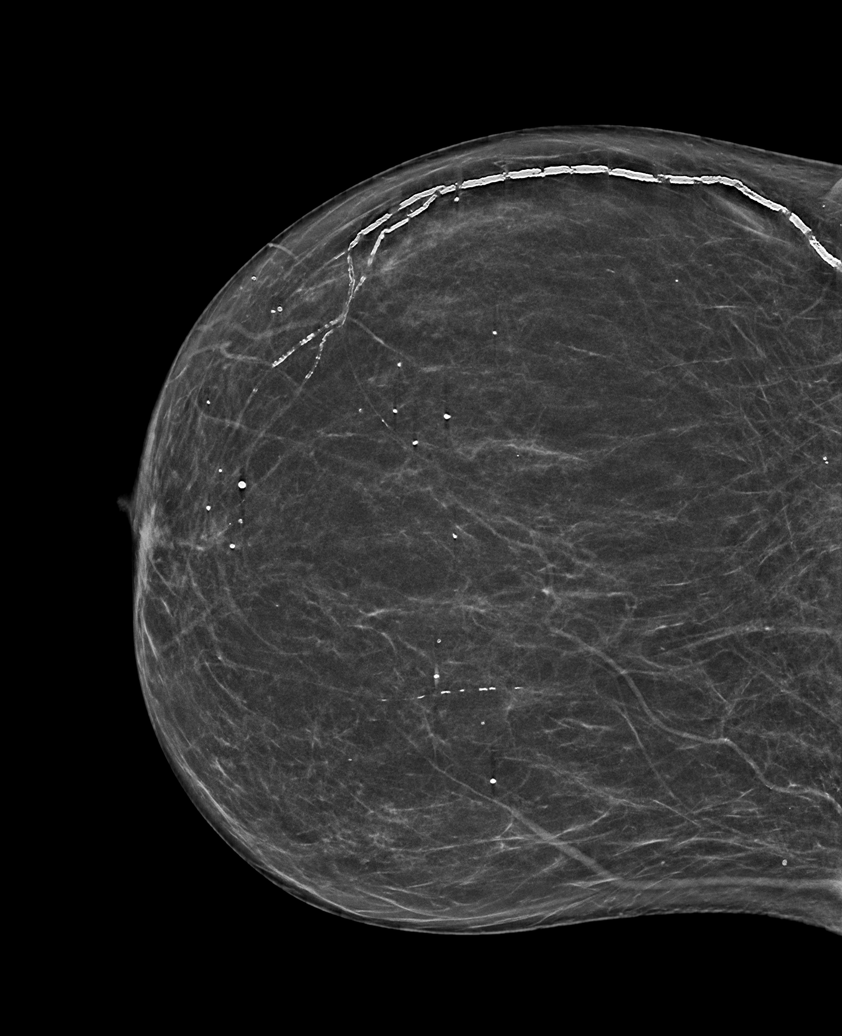

[L CC synth-2D (2 of 2)]
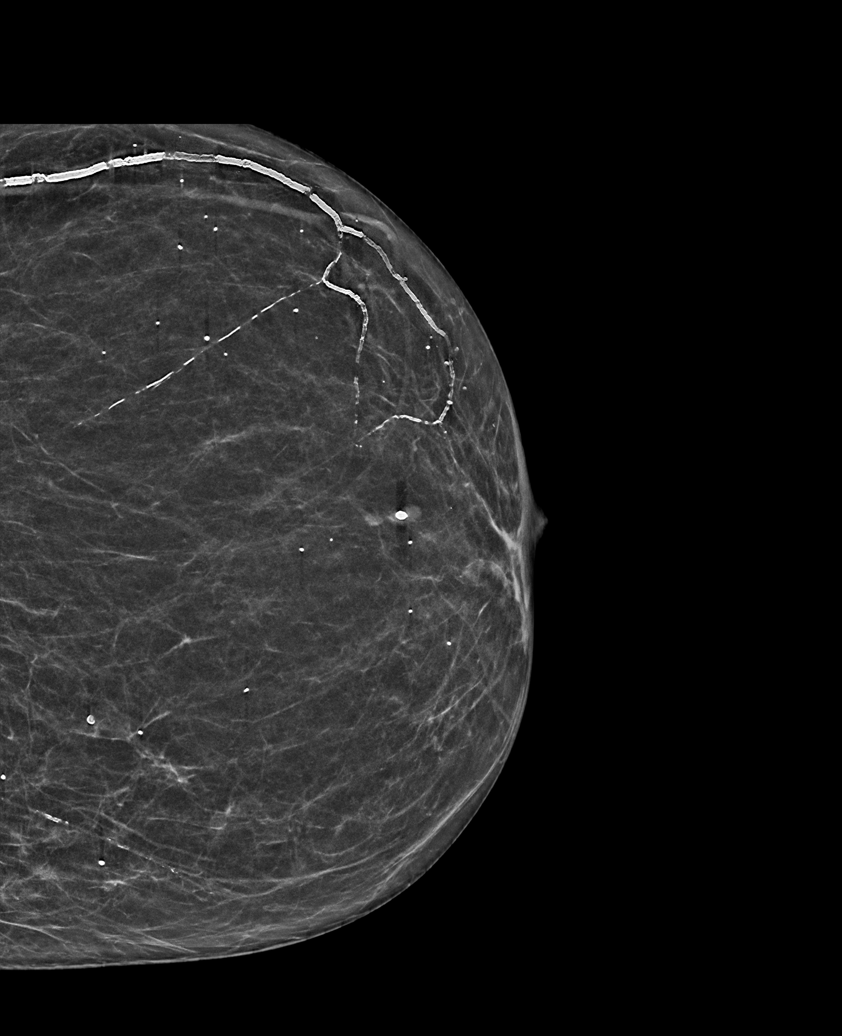

[L MLO tomo · tomo slice 37/74.0]
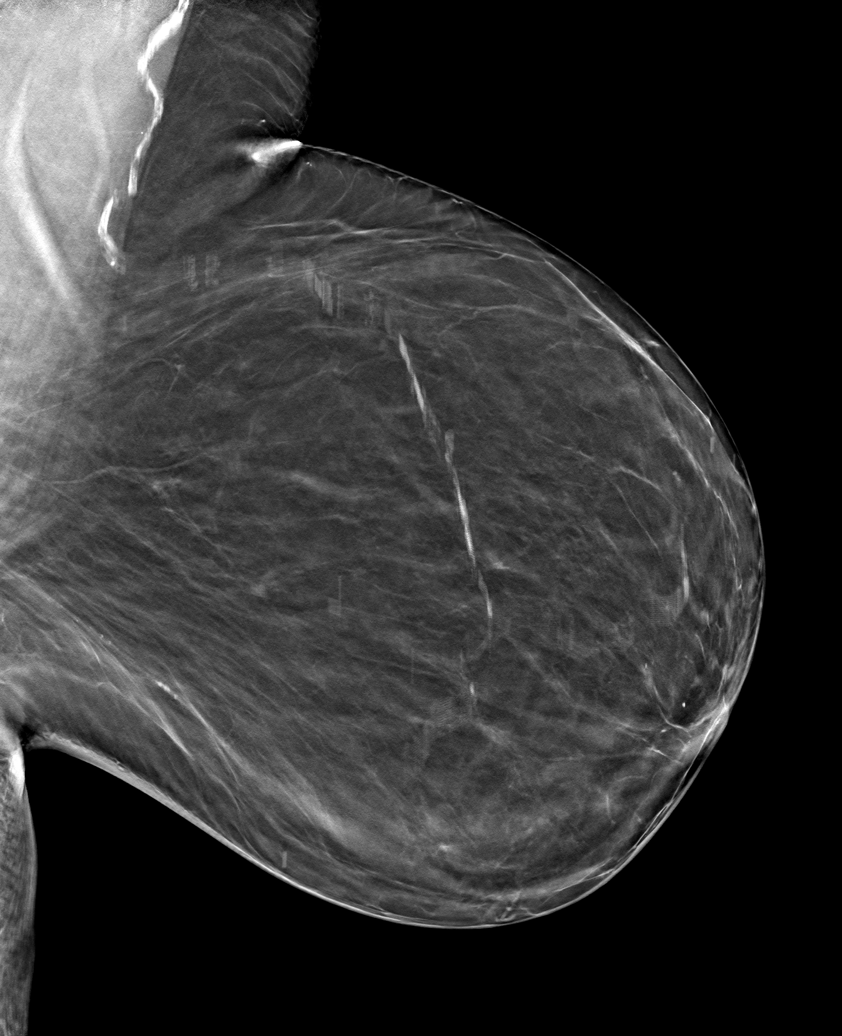

[6 of 30 positions shown; findings below may reference images not displayed]

FINDINGS: There are no findings suspicious for malignancy.
IMPRESSION: No mammographic evidence of malignancy. A result letter of this
screening mammogram will be mailed directly to the patient.

RECOMMENDATION:
Screening mammogram in one year. (Code:0E-3-N98)

BI-RADS CATEGORY  1: Negative.

## 2023-12-09 ENCOUNTER — Encounter: Payer: Self-pay | Admitting: Internal Medicine

## 2023-12-09 ENCOUNTER — Ambulatory Visit: Admitting: Internal Medicine

## 2023-12-09 VITALS — BP 110/78 | HR 75 | Temp 97.7°F | Ht 62.0 in | Wt 187.0 lb

## 2023-12-09 DIAGNOSIS — I2510 Atherosclerotic heart disease of native coronary artery without angina pectoris: Secondary | ICD-10-CM | POA: Diagnosis not present

## 2023-12-09 DIAGNOSIS — M79604 Pain in right leg: Secondary | ICD-10-CM | POA: Diagnosis not present

## 2023-12-09 DIAGNOSIS — E1159 Type 2 diabetes mellitus with other circulatory complications: Secondary | ICD-10-CM

## 2023-12-09 DIAGNOSIS — M79605 Pain in left leg: Secondary | ICD-10-CM

## 2023-12-09 DIAGNOSIS — M5440 Lumbago with sciatica, unspecified side: Secondary | ICD-10-CM

## 2023-12-09 DIAGNOSIS — I5042 Chronic combined systolic (congestive) and diastolic (congestive) heart failure: Secondary | ICD-10-CM

## 2023-12-09 DIAGNOSIS — E1165 Type 2 diabetes mellitus with hyperglycemia: Secondary | ICD-10-CM

## 2023-12-09 DIAGNOSIS — I110 Hypertensive heart disease with heart failure: Secondary | ICD-10-CM | POA: Diagnosis not present

## 2023-12-09 DIAGNOSIS — E1122 Type 2 diabetes mellitus with diabetic chronic kidney disease: Secondary | ICD-10-CM

## 2023-12-09 DIAGNOSIS — G8929 Other chronic pain: Secondary | ICD-10-CM | POA: Diagnosis not present

## 2023-12-09 DIAGNOSIS — N184 Chronic kidney disease, stage 4 (severe): Secondary | ICD-10-CM

## 2023-12-09 NOTE — Assessment & Plan Note (Signed)
 Much better

## 2023-12-09 NOTE — Assessment & Plan Note (Signed)
 LBP: Norco helps, using prn Diabetic shoes help  Potential benefits of a long term opioids use as well as potential risks (i.e. addiction risk, apnea etc) and complications (i.e. Somnolence, constipation and others) were explained to the patient and were aknowledged.

## 2023-12-09 NOTE — Assessment & Plan Note (Signed)
 Stable at present.

## 2023-12-09 NOTE — Assessment & Plan Note (Addendum)
 On Aspirin , Crestor  No CP

## 2023-12-09 NOTE — Assessment & Plan Note (Signed)
 Continue with Bystolic, Hydralazine, Torsemide Off Rx due to low BP

## 2023-12-09 NOTE — Progress Notes (Signed)
 Subjective:  Patient ID: [name], female    DOB: April 08, 1951  Age: 73 y.o. MRN: [id number]  CC: Medical Management of Chronic Issues (3 mnth f/u)   HPI [name] presents for OA pain, LBP: Norco helps, using prn. C/o CKD, DM CBG was 110 - better  Outpatient Medications Prior to Visit  Medication Sig Dispense Refill   acetaminophen  (TYLENOL ) 325 MG tablet Take 325 mg by mouth every 6 (six) hours as needed.     allopurinol (ZYLOPRIM) 100 MG tablet Take 100 mg by mouth daily.     amLODipine  (NORVASC ) 10 MG tablet Take by mouth.     Ascorbic Acid (VITAMIN C) 1000 MG tablet Take 500 mg by mouth daily.     aspirin  EC 81 MG tablet Take 2 tablets (162 mg total) by mouth daily.     calcium  carbonate (OS-CAL) 600 MG TABS tablet Take 600 mg by mouth 2 (two) times daily with a meal.     Cholecalciferol (VITAMIN D3) 50 MCG (2000 UT) capsule Take 1 capsule (2,000 Units total) by mouth daily. 100 capsule 3   ferrous sulfate  325 (65 FE) MG tablet Take 325 mg by mouth 2 (two) times daily with a meal.     glipiZIDE  (GLUCOTROL  XL) 5 MG 24 hr tablet TAKE 1 TABLET BY MOUTH DAILY  WITH BREAKFAST 100 tablet 2   hydrALAZINE  (APRESOLINE ) 25 MG tablet TAKE 1 TABLET BY MOUTH THREE TIMES A DAY 270 tablet 3   HYDROcodone -acetaminophen  (NORCO) 5-325 MG tablet Take 1 tablet by mouth every 6 (six) hours as needed for moderate pain (pain score 4-6). 60 tablet 0   lansoprazole  (PREVACID ) 15 MG capsule Take 1 capsule (15 mg total) by mouth daily at 12 noon. 90 capsule 3   nebivolol  (BYSTOLIC ) 10 MG tablet TAKE 1 TABLET BY MOUTH EVERY DAY 90 tablet 3   nitroGLYCERIN  (NITROSTAT ) 0.4 MG SL tablet Place 1 tablet (0.4 mg total) under the tongue every 5 (five) minutes as needed for chest pain. 20 tablet 3   ondansetron  (ZOFRAN ) 4 MG tablet Take 1 tablet (4 mg total) by mouth every 8 (eight) hours as needed for nausea or vomiting. 20 tablet 0   ONETOUCH ULTRA test strip USE TO TEST BLOOD SUGAR TWICE   DAILY AS DIRECTED 200 strip 2   potassium chloride  SA (KLOR-CON  M) 20 MEQ tablet TAKE 1 TABLET BY MOUTH DAILY 100 tablet 2   rosuvastatin  (CRESTOR ) 5 MG tablet TAKE 1 TABLET (5 MG TOTAL) BY MOUTH DAILY. TAKE 1 TABLET BY MOUTH DAILY AT 6PM (Patient taking differently: Take 5 mg by mouth every other day.) 90 tablet 2   torsemide  (DEMADEX ) 10 MG tablet Take 2.5 tablets (25 mg total) by mouth daily. 225 tablet 3   triamcinolone  cream (KENALOG ) 0.1 % Apply 1 Application topically 3 (three) times daily. On rash 160 g 3   vitamin B-12 (CYANOCOBALAMIN) 1000 MCG tablet Take 1,000 mcg by mouth daily.     No facility-administered medications prior to visit.    ROS: Review of Systems  Constitutional:  Positive for fatigue. Negative for activity change, appetite change, chills and unexpected weight change.  HENT:  Negative for congestion, mouth sores and sinus pressure.   Eyes:  Negative for visual disturbance.  Respiratory:  Negative for cough and chest tightness.   Gastrointestinal:  Negative for abdominal pain and nausea.  Genitourinary:  Negative for difficulty urinating, frequency and vaginal pain.  Musculoskeletal:  Positive for back pain and gait  problem. Negative for neck stiffness.  Skin:  Negative for color change, pallor and rash.  Neurological:  Negative for dizziness, tremors, weakness, numbness and headaches.  Psychiatric/Behavioral:  Negative for confusion, sleep disturbance and suicidal ideas. The patient is not nervous/anxious.     Objective:  BP 110/78   Pulse 75   Temp 97.7 F (36.5 C) (Oral)   Ht 5\' 2"  (1.575 m)   Wt 187 lb (84.8 kg)   LMP  (LMP Unknown)   SpO2 96%   BMI 34.20 kg/m   BP Readings from Last 3 Encounters:  12/09/23 110/78  10/20/23 130/80  09/08/23 118/62    Wt Readings from Last 3 Encounters:  12/09/23 187 lb (84.8 kg)  10/20/23 187 lb 6.4 oz (85 kg)  09/08/23 187 lb (84.8 kg)    Physical Exam Constitutional:      General: She is not in acute  distress.    Appearance: She is well-developed. She is obese.  HENT:     Head: Normocephalic.     Right Ear: External ear normal.     Left Ear: External ear normal.     Nose: Nose normal.  Eyes:     General:        Right eye: No discharge.        Left eye: No discharge.     Conjunctiva/sclera: Conjunctivae normal.     Pupils: Pupils are equal, round, and reactive to light.  Neck:     Thyroid : No thyromegaly.     Vascular: No JVD.     Trachea: No tracheal deviation.  Cardiovascular:     Rate and Rhythm: Normal rate and regular rhythm.     Heart sounds: Normal heart sounds.  Pulmonary:     Effort: No respiratory distress.     Breath sounds: No stridor. No wheezing.  Abdominal:     General: Bowel sounds are normal. There is no distension.     Palpations: Abdomen is soft. There is no mass.     Tenderness: There is no abdominal tenderness. There is no guarding or rebound.  Musculoskeletal:        General: Tenderness present.     Cervical back: Normal range of motion and neck supple. No rigidity.     Right lower leg: No edema.     Left lower leg: No edema.  Lymphadenopathy:     Cervical: No cervical adenopathy.  Skin:    Findings: No erythema or rash.  Neurological:     Mental Status: She is oriented to person, place, and time.     Cranial Nerves: No cranial nerve deficit.     Motor: No abnormal muscle tone.     Coordination: Coordination normal.     Gait: Gait abnormal.     Deep Tendon Reflexes: Reflexes normal.  Psychiatric:        Behavior: Behavior normal.        Thought Content: Thought content normal.        Judgment: Judgment normal.   LS w/pain Antalgic gait  Lab Results  Component Value Date   WBC 6.4 02/12/2023   HGB 10.0 (L) 02/12/2023   HCT 33.0 (L) 02/12/2023   PLT 131 (L) 02/12/2023   GLUCOSE 127 (H) 02/12/2023   CHOL 129 12/16/2021   TRIG 78 12/16/2021   HDL 49 12/16/2021   LDLCALC 65 12/16/2021   ALT 11 12/16/2021   AST 14 07/17/2021   NA 139  02/12/2023   K 4.0 02/12/2023  CL 108 02/12/2023   CREATININE 2.88 (H) 02/12/2023   BUN 89 (H) 02/12/2023   CO2 18 (L) 02/12/2023   TSH 1.51 07/17/2021   INR 1.1 06/17/2020   HGBA1C 6.5 (A) 10/20/2023   MICROALBUR 13.9 (H) 10/13/2007    MM 3D SCREENING MAMMOGRAM BILATERAL BREAST Result Date: 03/15/2023 CLINICAL DATA:  Screening. EXAM: DIGITAL SCREENING BILATERAL MAMMOGRAM WITH TOMOSYNTHESIS AND CAD TECHNIQUE: Bilateral screening digital craniocaudal and mediolateral oblique mammograms were obtained. Bilateral screening digital breast tomosynthesis was performed. The images were evaluated with computer-aided detection. COMPARISON:  Previous exam(s). ACR Breast Density Category a: The breasts are almost entirely fatty. FINDINGS: There are no findings suspicious for malignancy. IMPRESSION: No mammographic evidence of malignancy. A result letter of this screening mammogram will be mailed directly to the patient. RECOMMENDATION: Screening mammogram in one year. (Code:SM-B-01Y) BI-RADS CATEGORY  1: Negative. Electronically Signed   By: Sundra Engel M.D.   On: 03/15/2023 17:22    Assessment & Plan:   Problem List Items Addressed This Visit     Poorly controlled type 2 diabetes mellitus with circulatory disorder (HCC)   Much better      Relevant Medications   amLODipine  (NORVASC ) 10 MG tablet   LEG PAIN, BILATERAL    LBP: Norco helps, using prn Diabetic shoes help  Potential benefits of a long term opioids use as well as potential risks (i.e. addiction risk, apnea etc) and complications (i.e. Somnolence, constipation and others) were explained to the patient and were aknowledged.       CAD (coronary artery disease) - Primary   On Aspirin , Crestor  No CP        Relevant Medications   amLODipine  (NORVASC ) 10 MG tablet   Low back pain    LBP: Norco helps, using prn Diabetic shoes help  Potential benefits of a long term opioids use as well as potential risks (i.e. addiction risk, apnea  etc) and complications (i.e. Somnolence, constipation and others) were explained to the patient and were aknowledged.      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     Follow-up: Return in about 3 months (around 03/10/2024) for a follow-up visit.  Anitra Barn, MD

## 2023-12-22 ENCOUNTER — Encounter (HOSPITAL_BASED_OUTPATIENT_CLINIC_OR_DEPARTMENT_OTHER): Payer: Self-pay

## 2023-12-23 NOTE — Progress Notes (Signed)
 Cardiology Office Note   Date:  12/24/2023   ID:  [name], [name] [name], MRN [id number]  PCP:  [name] [name], MD  Cardiologist:   [name] Passe, MD   Chief Complaint  Patient presents with   Coronary Artery Disease        1. Coronary artery disease-status post PTCA and stenting of her mid LAD using a 3.5 x 15 mm Alpine stent ( DES) ( Dec. 5, 2014)   2. chronic systolic congestive heart failure-ejection fraction of 35% 2. Hypertension 3. COPD 4. Diabetes mellitus 5. History colon cancer    [name] is seen today. She was initially seen by Dr. Dominick in the hospital. She has a history of chronic systolic congestive heart failure with an ejection fraction of around 35%. She has a history of coronary artery disease. She is status post PTCA and stenting of her mid left anterior descending artery using a 3.5 x 15 mm Alpine stent.   She has a history of COPD. [name] not had a cigarette since he left the hospital. She also has a history of diabetes mellitus and history of colon cancer  She has mild interscapular pain this morning .  And the pain resolved after she drank some club soda.  Did not feel like her MI pain.    She has been walking every morining at the mall and at [name].   Her BP is up today.   - she may have had lots of salt last night.   The BP has remained high   September 20, 2013  [name] has been having some episodes of chest discomfort. She initially thought it might be due to indigestion or due to something that she ate.  It radiated to her interscapular region. It was associated with some hand and normal tingling. The pain was intermittent but lasted for several hours.   She took a sublingual nitroglycerin  and the pain resolved very quickly and she did not have any further episodes the whole rest of the day.  She informed that she ran out of her Lasix  3 days ago.  November 01, 2013:  [name] is feeling well.  She stopped smoking the day of her  cath.  She is eating more penny candy to replace the habit.   January 31, 2014:  She is doing ok.  She has no angina.  Not exercising.  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     She has a bruise on her leg where she bumped into a table.   Oct. 29, 2015:  [name] is doing very well. She is not having any episodes of chest pain. [name] not exercising quite as much as she would like. Stent was placed in December of 2014.  She had a nose bleed last week. ( she is on Effient  until Dec. 2015).  Her Bp was  A bit elevated because of some salt in her diet.    No CP or dyspnea    November 02, 2014:  [name] is a 73 y.o. female who presents for follow-up of her coronary artery disease. [name] done fairly well. She has had some muscle pain and a rash. She has occasional episodes of dizziness. She had some non-cardiac CP several weeks ago when she started Trajenta.  Stopped the med and has not had any further episodes of CP.    Oct. 26, 2016  [name] is doing well She had a echo recently   Left ventricle:  The cavity size was normal. Wall thickness was   increased in a pattern of mild LVH with focal basal septal   hypertrophy. Systolic function was normal. The estimated ejection   fraction was in the range of 60% to 65%. Wall motion was normal;   there were no regional wall motion abnormalities. Doppler   parameters are consistent with abnormal left ventricular   relaxation (grade 1 diastolic dysfunction). - Aortic valve: There was no stenosis. There was trivial   regurgitation. - Mitral valve: Mildly calcified annulus. Mildly calcified leaflets   . There was no significant regurgitation. - Right ventricle: The cavity size was normal. Systolic function   was normal. - Tricuspid valve: Peak RV-RA gradient (S): 21 mm Hg. - Pulmonary arteries: PA peak pressure: 24 mm Hg (S). - Inferior vena cava: The vessel was normal in size. The   respirophasic diameter changes were in the normal range  (>= 50%),   consistent with normal central venous pressure  [name] is doing well  BP has been elevated for the past 2 weeks.  Has had some left arm pain / ached Left arm aches all the time,  Constant.  Worse with sitting, not worsened by exercise .   October 28, 2015:  [name] is doing well.  She went with [facility] - it took 2 weeks for them to get her meds to her .  Has chronic left arm pain .  Oct. 23, 2017:  Doing well from a cardiac standpoint Having eye surgery . Has had a chronic cough [name], [name], MD changed her Coreg  to Bystolic  Did not seem to help the cough   Jan. 24, 2018:  doing well. Still eating some salty foods.  Still smoking some,  Advised her to quit.  Echo from 2016 shows normal LV systolic function Needs to have an contrast imaging study of her eye. She is at low risk for that procedure.   Aug. 17, 2018:  [name] is seen back today for follow up of her CHF   Dec 03, 2017:    [name] is seen back for follow up of her CHF and HTN Doing well. EF is now normal.   Gets some exercise - not as much as she would like  Has some ankle swelling  Has had some arm pain that she associated with the Losartan  ( MSK pain is mentioned in Epocretes)   Nov. 6, 2019:  [name] is seen for follow up visit  BP is well controlled  Had an episodes of CP while very upset ( her brother had just had a aortic dissection ( 10.5 hours of surgery )  Brother is 49 yo .    No further episodes of CP after the stress  Has a rash on the left side of the chest  She was not tolerating the micardis ( cough)  She started taking hydralazine  on September 26.  She now has developed a rash on the left side of her chest.  She was started on torsemide  instead of furosemide  by her primary medical doctor.  She does okay when she takes the 50 mg of torsemide .  When she takes 100 mg of torsemide  she has severe muscle cramping.  Nov. 9, 2020 :  No cp. No dyspnea Had some coughing in sept  when she was exposed to marijuana smoke from neighbors.   Had eye surgery last week.    October 23, 2020:  [name] is seen today for follow-up of her coronary artery  disease and hyperlipidemia.  She has a history of hypertension.  She has grade 1 diastolic dysfunction. No CP ,  No dyspnea.  No bacon in 4 months  Was hospitalized with sepsis several months ago.  Started as an abscess on her legs.  She wound up in a nursing home because of profound weakness.  She is now walking quite a bit better. Still smoking . I recommended cessation .  1. Coronary artery disease-status post PTCA and stenting of her mid LAD using a 3.5 x 15 mm Alpine stent ( DES) ( Dec. 5, 2014)   2. chronic systolic congestive heart failure-ejection fraction of 35% 2. Hypertension 3. COPD  Echo from 2020 shows normal LV systolic function .   [name] 13, 2023:   [name] seen today for follow-up of her coronary artery disease.  She has a history of hypertension and chronic grade 1 diastolic dysfunction.  Doing well Walking quite a bit .  No cp. Breathing is ok  Had a fall and then Had a DVT in her left leg a month ago,  now is on Eliquis  5 BID    [name] 20, 2025 [name] is seen for follow up of her CAD, coronary stenting ,   HTN, grade I DD  No CP , no dyspnea  Saw Ned Alberts , NP ordered a stress test  for her  CP  No ischemia ,  no infarction   Past Medical History:  Diagnosis Date   Anemia    COPD (chronic obstructive pulmonary disease) (HCC)    Diabetes mellitus    Hyperlipidemia    Hypertension    Ischemic cardiomyopathy 06/11/2013   Low back pain    NSTEMI (non-ST elevated myocardial infarction) (HCC) 06/11/2013   Obesity    Personal history of colon cancer    Renal insufficiency     Past Surgical History:  Procedure Laterality Date   COLECTOMY     INCISION AND DRAINAGE PERIRECTAL ABSCESS N/A 03/07/2020   Procedure: IRRIGATION AND DEBRIDEMENT PERINEUM  AND PANNUS ABSCESS;  Surgeon: Vanderbilt Ned, MD;  Location: [facility] OR;  Service: General;  Laterality: N/A;   IR RADIOLOGIST EVAL & MGMT  08/06/2020   LEFT HEART CATHETERIZATION WITH CORONARY ANGIOGRAM N/A 06/09/2013   Procedure: LEFT HEART CATHETERIZATION WITH CORONARY ANGIOGRAM;  Surgeon: Mickey [name] JINNY Alveta, MD;  Location: [facility] CATH LAB;  Service: Cardiovascular;  Laterality: N/A;     Current Outpatient Medications  Medication Sig Dispense Refill   acetaminophen  (TYLENOL ) 325 MG tablet Take 325 mg by mouth every 6 (six) hours as needed.     allopurinol (ZYLOPRIM) 100 MG tablet Take 100 mg by mouth daily.     amLODipine  (NORVASC ) 10 MG tablet Take by mouth.     Ascorbic Acid (VITAMIN C) 1000 MG tablet Take 500 mg by mouth daily.     aspirin  EC 81 MG tablet Take 2 tablets (162 mg total) by mouth daily.     calcium  carbonate (OS-CAL) 600 MG TABS tablet Take 600 mg by mouth 2 (two) times daily with a meal.     Cholecalciferol (VITAMIN D3) 50 MCG (2000 UT) capsule Take 1 capsule (2,000 Units total) by mouth daily. 100 capsule 3   ferrous sulfate  325 (65 FE) MG tablet Take 325 mg by mouth 2 (two) times daily with a meal.     glipiZIDE  (GLUCOTROL  XL) 5 MG 24 hr tablet TAKE 1 TABLET BY MOUTH DAILY  WITH BREAKFAST 100 tablet 2   hydrALAZINE  (APRESOLINE )  25 MG tablet TAKE 1 TABLET BY MOUTH THREE TIMES A DAY 270 tablet 3   HYDROcodone -acetaminophen  (NORCO) 5-325 MG tablet Take 1 tablet by mouth every 6 (six) hours as needed for moderate pain (pain score 4-6). 60 tablet 0   lansoprazole  (PREVACID ) 15 MG capsule Take 1 capsule (15 mg total) by mouth daily at 12 noon. 90 capsule 3   nebivolol  (BYSTOLIC ) 10 MG tablet TAKE 1 TABLET BY MOUTH EVERY DAY 90 tablet 3   nitroGLYCERIN  (NITROSTAT ) 0.4 MG SL tablet Place 1 tablet (0.4 mg total) under the tongue every 5 (five) minutes as needed for chest pain. 20 tablet 3   ondansetron  (ZOFRAN ) 4 MG tablet Take 1 tablet (4 mg total) by mouth every 8 (eight) hours as needed for nausea or vomiting. 20 tablet 0    ONETOUCH ULTRA test strip USE TO TEST BLOOD SUGAR TWICE  DAILY AS DIRECTED 200 strip 2   potassium chloride  SA (KLOR-CON  M) 20 MEQ tablet TAKE 1 TABLET BY MOUTH DAILY 100 tablet 2   rosuvastatin  (CRESTOR ) 5 MG tablet TAKE 1 TABLET (5 MG TOTAL) BY MOUTH DAILY. TAKE 1 TABLET BY MOUTH DAILY AT 6PM (Patient taking differently: Take 5 mg by mouth every other day.) 90 tablet 2   torsemide  (DEMADEX ) 10 MG tablet Take 2.5 tablets (25 mg total) by mouth daily. 225 tablet 3   triamcinolone  cream (KENALOG ) 0.1 % Apply 1 Application topically 3 (three) times daily. On rash 160 g 3   vitamin B-12 (CYANOCOBALAMIN) 1000 MCG tablet Take 1,000 mcg by mouth daily.     No current facility-administered medications for this visit.    Allergies:   Tradjenta  [linagliptin ], Albuterol , Atorvastatin , Biobrane gloves large [wound dressings], Enalapril maleate, Farxiga  [dapagliflozin ], Hydrochlorothiazide, Kenalog  [triamcinolone  acetonide], Levaquin [levofloxacin], Lisinopril, Metformin  and related, Propoxyphene n-acetaminophen , Simvastatin, and Spironolactone    Social History:  The patient  reports that she has been smoking cigarettes. She has never used smokeless tobacco. She reports that she does not drink alcohol and does not use drugs.   Family History:  The patient's family history includes Hypertension in an other family member.    ROS:   Noted in current hx , otherwise negative     Physical Exam: Blood pressure 118/74, pulse 74, height 5' 2 (1.575 m), weight 187 lb (84.8 kg), SpO2 92%.       GEN:  Well nourished, well developed in no acute distress HEENT: Normal NECK: No JVD; No carotid bruits LYMPHATICS: No lymphadenopathy CARDIAC: RRR , no murmurs, rubs, gallops RESPIRATORY:  Clear to auscultation without rales, wheezing or rhonchi  ABDOMEN: Soft, non-tender, non-distended MUSCULOSKELETAL:  No edema; No deformity  SKIN: Warm and dry NEUROLOGIC:  Alert and oriented x 3     EKG:             Recent Labs: 02/12/2023: BUN 89; Creatinine, Ser 2.88; Hemoglobin 10.0; Platelets 131; Potassium 4.0; Sodium 139    Lipid Panel    Component Value Date/Time   CHOL 129 12/16/2021 1408   TRIG 78 12/16/2021 1408   HDL 49 12/16/2021 1408   CHOLHDL 2.6 12/16/2021 1408   CHOLHDL 2 01/10/2020 1057   VLDL 14.6 01/10/2020 1057   LDLCALC 65 12/16/2021 1408      Wt Readings from Last 3 Encounters:  12/24/23 187 lb (84.8 kg)  12/09/23 187 lb (84.8 kg)  10/20/23 187 lb 6.4 oz (85 kg)      Other studies Reviewed: Additional studies/ records that were reviewed today include: .  Review of the above records demonstrates:    ASSESSMENT AND PLAN:  1. Coronary artery disease-status post PTCA and stenting of her mid LAD using a 3.5 x 15 mm Alpine stent ( DES) ( Dec. 5, 2014)    No angina . Seems to be doing well      2. chronic systolic congestive heart failure- initial ejection fraction of 35% -her LV function has improved.   Continue current mes.      3.  Hypertensive heart disease with congestive heart failure  -      5.  Hyperlipidemia:    Her last LDL is 65 Continue current meds .     Current medicines are reviewed at length with the patient today.  The patient does not have concerns regarding medicines.    Labs/ tests ordered today include:   No orders of the defined types were placed in this encounter.       [name] Passe, MD  12/24/2023 11:01 AM    [facility] [address] [place], [place], [place]  [address] Phone: [phone]; Fax: [phone]

## 2023-12-24 ENCOUNTER — Ambulatory Visit: Attending: Cardiovascular Disease | Admitting: Cardiovascular Disease

## 2023-12-24 ENCOUNTER — Encounter: Payer: Self-pay | Admitting: Cardiovascular Disease

## 2023-12-24 VITALS — BP 118/74 | HR 74 | Ht 62.0 in | Wt 187.0 lb

## 2023-12-24 DIAGNOSIS — I2510 Atherosclerotic heart disease of native coronary artery without angina pectoris: Secondary | ICD-10-CM | POA: Diagnosis not present

## 2023-12-24 DIAGNOSIS — E782 Mixed hyperlipidemia: Secondary | ICD-10-CM

## 2023-12-24 DIAGNOSIS — R0789 Other chest pain: Secondary | ICD-10-CM | POA: Diagnosis not present

## 2023-12-24 NOTE — Patient Instructions (Signed)
 Follow-Up: At [facility], you and your health needs are our priority.  As part of our continuing mission to provide you with exceptional heart care, our providers are all part of one team.  This team includes your primary Cardiologist (physician) and Advanced Practice Providers or APPs (Physician Assistants and Nurse Practitioners) who all work together to provide you with the care you need, when you need it.  Your next appointment:   1 year(s)  Provider:   Ahmad Alert, MD Christena Covert, MD

## 2024-01-01 ENCOUNTER — Other Ambulatory Visit: Payer: Self-pay | Admitting: Internal Medicine

## 2024-01-03 ENCOUNTER — Other Ambulatory Visit: Payer: Self-pay | Admitting: Internal Medicine

## 2024-01-27 DIAGNOSIS — H59813 Chorioretinal scars after surgery for detachment, bilateral: Secondary | ICD-10-CM | POA: Diagnosis not present

## 2024-01-27 DIAGNOSIS — E113513 Type 2 diabetes mellitus with proliferative diabetic retinopathy with macular edema, bilateral: Secondary | ICD-10-CM | POA: Diagnosis not present

## 2024-01-27 DIAGNOSIS — H3561 Retinal hemorrhage, right eye: Secondary | ICD-10-CM | POA: Diagnosis not present

## 2024-01-27 DIAGNOSIS — H3582 Retinal ischemia: Secondary | ICD-10-CM | POA: Diagnosis not present

## 2024-01-27 DIAGNOSIS — H2513 Age-related nuclear cataract, bilateral: Secondary | ICD-10-CM | POA: Diagnosis not present

## 2024-02-28 ENCOUNTER — Telehealth: Payer: Self-pay

## 2024-02-28 DIAGNOSIS — I5032 Chronic diastolic (congestive) heart failure: Secondary | ICD-10-CM

## 2024-02-28 DIAGNOSIS — I2510 Atherosclerotic heart disease of native coronary artery without angina pectoris: Secondary | ICD-10-CM

## 2024-02-28 DIAGNOSIS — I152 Hypertension secondary to endocrine disorders: Secondary | ICD-10-CM

## 2024-02-28 NOTE — Telephone Encounter (Signed)
-----   Message from Olam ORN sent at 02/28/2024  8:02 AM EDT ----- Please create a new order for echo.. old o ne has expired before appt. Thank you.

## 2024-02-28 NOTE — Telephone Encounter (Signed)
 New order has been placed.

## 2024-03-01 ENCOUNTER — Ambulatory Visit: Admitting: Podiatry

## 2024-03-08 DIAGNOSIS — E113511 Type 2 diabetes mellitus with proliferative diabetic retinopathy with macular edema, right eye: Secondary | ICD-10-CM | POA: Diagnosis not present

## 2024-03-14 ENCOUNTER — Telehealth: Payer: Self-pay

## 2024-03-14 NOTE — Telephone Encounter (Signed)
 Copied from CRM [id number]. Topic: General - Other >> Mar 14, 2024  4:03 PM Burnard DEL wrote: Reason for CRM: [facility] - [place], [place] - [address]  Phone: [phone] Fax: [phone]  Would like to have prescription for contour or accucheck sent to them for lancets,test strips and meter. Her insurance doesn't cover what she is currently prescribed any more.

## 2024-03-15 ENCOUNTER — Encounter: Payer: Self-pay | Admitting: Internal Medicine

## 2024-03-15 ENCOUNTER — Telehealth: Payer: Self-pay | Admitting: Radiology

## 2024-03-15 ENCOUNTER — Ambulatory Visit: Admitting: Internal Medicine

## 2024-03-15 VITALS — BP 110/78 | HR 68 | Temp 98.4°F | Ht 62.0 in | Wt 184.2 lb

## 2024-03-15 DIAGNOSIS — E1122 Type 2 diabetes mellitus with diabetic chronic kidney disease: Secondary | ICD-10-CM | POA: Diagnosis not present

## 2024-03-15 DIAGNOSIS — L02416 Cutaneous abscess of left lower limb: Secondary | ICD-10-CM | POA: Diagnosis not present

## 2024-03-15 DIAGNOSIS — N184 Chronic kidney disease, stage 4 (severe): Secondary | ICD-10-CM

## 2024-03-15 DIAGNOSIS — M5440 Lumbago with sciatica, unspecified side: Secondary | ICD-10-CM | POA: Diagnosis not present

## 2024-03-15 DIAGNOSIS — I2510 Atherosclerotic heart disease of native coronary artery without angina pectoris: Secondary | ICD-10-CM | POA: Diagnosis not present

## 2024-03-15 DIAGNOSIS — G8929 Other chronic pain: Secondary | ICD-10-CM | POA: Diagnosis not present

## 2024-03-15 MED ORDER — MUPIROCIN 2 % EX OINT: TOPICAL_OINTMENT | CUTANEOUS | 0 refills | Status: AC

## 2024-03-15 MED ORDER — HYDROCODONE-ACETAMINOPHEN 5-325 MG PO TABS: 1.0000 | ORAL_TABLET | Freq: Four times a day (QID) | ORAL | 0 refills | Status: AC | PRN

## 2024-03-15 MED ORDER — DOXYCYCLINE HYCLATE 100 MG PO TABS: 100.0000 mg | ORAL_TABLET | Freq: Two times a day (BID) | ORAL | 0 refills | Status: DC

## 2024-03-15 MED ORDER — DOXYCYCLINE HYCLATE 100 MG PO TABS: 100.0000 mg | ORAL_TABLET | Freq: Two times a day (BID) | ORAL | 1 refills | Status: DC

## 2024-03-15 NOTE — Assessment & Plan Note (Addendum)
 Groin abscess-new.  Walnut size boil on the L inner prox thigh, tender for 2 weeks Pt declined surgery referral, IM Rocephin  Prescribed doxycycline , renewed hydrocodone  Use Epsom salt based or Epsom salt soaks/sitz bath's Go to ER if worse Bactroban  ointment to use topically when of the abscess opens up

## 2024-03-15 NOTE — Assessment & Plan Note (Signed)
 On Aspirin , Crestor  No CP

## 2024-03-15 NOTE — Assessment & Plan Note (Signed)
 Continue with Bystolic, Hydralazine, Torsemide Off Rx due to low BP

## 2024-03-15 NOTE — Patient Instructions (Signed)
 Use Epsom salt paste or soakes

## 2024-03-15 NOTE — Telephone Encounter (Signed)
 Copied from CRM [id number]. Topic: Clinical - Prescription Issue >> Mar 15, 2024 12:47 PM Robinson H wrote: Reason for CRM: Patient was in office today and prescribed antibiotics, states pharmacy won't have medication in stock until 9/21. Patient wants to know if Dr. Garald wants her to wait that long or call in something else.  [name] [phone]

## 2024-03-15 NOTE — Progress Notes (Signed)
 Subjective:  Patient ID: [name], female    DOB: 1950-12-09  Age: 73 y.o. MRN: [id number]  CC: Follow-up (Patient states boil butt. Left ventro glueteal between hip and thigh. Noticed 2 weeks. Painful. Has tried alcohol and epsom salt and nothing is helping. Pain scale:5 )   HPI Dellamae Rosamilia presents for L buttock boil x 2 weeks, painful CBG 123  Outpatient Medications Prior to Visit  Medication Sig Dispense Refill   acetaminophen  (TYLENOL ) 325 MG tablet Take 325 mg by mouth every 6 (six) hours as needed.     allopurinol (ZYLOPRIM) 100 MG tablet Take 100 mg by mouth daily.     Ascorbic Acid (VITAMIN C) 1000 MG tablet Take 500 mg by mouth daily.     calcium  carbonate (OS-CAL) 600 MG TABS tablet Take 600 mg by mouth 2 (two) times daily with a meal.     Cholecalciferol (VITAMIN D3) 50 MCG (2000 UT) capsule Take 1 capsule (2,000 Units total) by mouth daily. 100 capsule 3   ferrous sulfate  325 (65 FE) MG tablet Take 325 mg by mouth 2 (two) times daily with a meal.     glipiZIDE  (GLUCOTROL  XL) 5 MG 24 hr tablet TAKE 1 TABLET BY MOUTH DAILY  WITH BREAKFAST 100 tablet 2   hydrALAZINE  (APRESOLINE ) 25 MG tablet TAKE 1 TABLET BY MOUTH THREE TIMES A DAY 270 tablet 3   lansoprazole  (PREVACID ) 15 MG capsule Take 1 capsule (15 mg total) by mouth daily at 12 noon. 90 capsule 3   nebivolol  (BYSTOLIC ) 10 MG tablet TAKE 1 TABLET BY MOUTH EVERY DAY 90 tablet 3   nitroGLYCERIN  (NITROSTAT ) 0.4 MG SL tablet Place 1 tablet (0.4 mg total) under the tongue every 5 (five) minutes as needed for chest pain. 20 tablet 3   ondansetron  (ZOFRAN ) 4 MG tablet Take 1 tablet (4 mg total) by mouth every 8 (eight) hours as needed for nausea or vomiting. 20 tablet 0   ONETOUCH ULTRA test strip USE TO TEST BLOOD SUGAR TWICE  DAILY AS DIRECTED 200 strip 2   potassium chloride  SA (KLOR-CON  M) 20 MEQ tablet TAKE 1 TABLET BY MOUTH DAILY 100 tablet 2   rosuvastatin  (CRESTOR ) 5 MG tablet TAKE 1 TABLET (5 MG  TOTAL) BY MOUTH DAILY AT 6PM 90 tablet 2   torsemide  (DEMADEX ) 10 MG tablet Take 2.5 tablets (25 mg total) by mouth daily. 225 tablet 3   triamcinolone  cream (KENALOG ) 0.1 % Apply 1 Application topically 3 (three) times daily. On rash 160 g 3   vitamin B-12 (CYANOCOBALAMIN) 1000 MCG tablet Take 1,000 mcg by mouth daily.     HYDROcodone -acetaminophen  (NORCO) 5-325 MG tablet Take 1 tablet by mouth every 6 (six) hours as needed for moderate pain (pain score 4-6). 60 tablet 0   amLODipine  (NORVASC ) 10 MG tablet TAKE ONE-HALF TABLET BY MOUTH  DAILY (Patient not taking: Reported on 03/15/2024) 50 tablet 2   No facility-administered medications prior to visit.    ROS: Review of Systems  Constitutional:  Negative for activity change, appetite change, chills, fatigue, fever and unexpected weight change.  HENT:  Negative for congestion, mouth sores and sinus pressure.   Eyes:  Negative for visual disturbance.  Respiratory:  Negative for cough and chest tightness.   Gastrointestinal:  Negative for abdominal pain and nausea.  Genitourinary:  Negative for difficulty urinating, frequency and vaginal pain.  Musculoskeletal:  Positive for back pain. Negative for gait problem.  Skin:  Negative for pallor and rash.  Neurological:  Negative for dizziness, tremors, weakness, numbness and headaches.  Hematological:  Does not bruise/bleed easily.  Psychiatric/Behavioral:  Negative for confusion, sleep disturbance and suicidal ideas.     Objective:  BP 110/78   Pulse 68   Temp 98.4 F (36.9 C) (Oral)   Ht 5' 2 (1.575 m)   Wt 184 lb 3.2 oz (83.6 kg)   LMP  (LMP Unknown)   SpO2 97%   BMI 33.69 kg/m   BP Readings from Last 3 Encounters:  03/15/24 110/78  12/24/23 118/74  12/09/23 110/78    Wt Readings from Last 3 Encounters:  03/15/24 184 lb 3.2 oz (83.6 kg)  12/24/23 187 lb (84.8 kg)  12/09/23 187 lb (84.8 kg)    Physical Exam Constitutional:      General: She is not in acute distress.     Appearance: She is well-developed. She is obese. She is not ill-appearing or toxic-appearing.  HENT:     Head: Normocephalic.     Right Ear: External ear normal.     Left Ear: External ear normal.     Nose: Nose normal.  Eyes:     General:        Right eye: No discharge.        Left eye: No discharge.     Conjunctiva/sclera: Conjunctivae normal.     Pupils: Pupils are equal, round, and reactive to light.  Neck:     Thyroid : No thyromegaly.     Vascular: No JVD.     Trachea: No tracheal deviation.  Cardiovascular:     Rate and Rhythm: Normal rate and regular rhythm.     Heart sounds: Normal heart sounds.  Pulmonary:     Effort: No respiratory distress.     Breath sounds: No stridor. No wheezing.  Abdominal:     General: Bowel sounds are normal. There is no distension.     Palpations: Abdomen is soft. There is no mass.     Tenderness: There is no abdominal tenderness. There is no guarding or rebound.  Musculoskeletal:        General: Tenderness present.     Cervical back: Normal range of motion and neck supple. No rigidity.     Right lower leg: No edema.     Left lower leg: No edema.  Lymphadenopathy:     Cervical: No cervical adenopathy.  Skin:    Findings: Erythema and lesion present. No rash.  Neurological:     Mental Status: She is oriented to person, place, and time.     Cranial Nerves: No cranial nerve deficit.     Motor: No abnormal muscle tone.     Coordination: Coordination normal.     Deep Tendon Reflexes: Reflexes normal.  Psychiatric:        Behavior: Behavior normal.        Thought Content: Thought content normal.        Judgment: Judgment normal.   LS w/pain Walnut size boil on the L inner prox thigh, tender  Lab Results  Component Value Date   WBC 6.4 02/12/2023   HGB 10.0 (L) 02/12/2023   HCT 33.0 (L) 02/12/2023   PLT 131 (L) 02/12/2023   GLUCOSE 127 (H) 02/12/2023   CHOL 129 12/16/2021   TRIG 78 12/16/2021   HDL 49 12/16/2021   LDLCALC 65  12/16/2021   ALT 11 12/16/2021   AST 14 07/17/2021   NA 139 02/12/2023   K 4.0 02/12/2023   CL 108 02/12/2023  CREATININE 2.88 (H) 02/12/2023   BUN 89 (H) 02/12/2023   CO2 18 (L) 02/12/2023   TSH 1.51 07/17/2021   INR 1.1 06/17/2020   HGBA1C 6.5 (A) 10/20/2023   MICROALBUR 13.9 (H) 10/13/2007    MM 3D SCREENING MAMMOGRAM BILATERAL BREAST Result Date: 03/15/2023 CLINICAL DATA:  Screening. EXAM: DIGITAL SCREENING BILATERAL MAMMOGRAM WITH TOMOSYNTHESIS AND CAD TECHNIQUE: Bilateral screening digital craniocaudal and mediolateral oblique mammograms were obtained. Bilateral screening digital breast tomosynthesis was performed. The images were evaluated with computer-aided detection. COMPARISON:  Previous exam(s). ACR Breast Density Category a: The breasts are almost entirely fatty. FINDINGS: There are no findings suspicious for malignancy. IMPRESSION: No mammographic evidence of malignancy. A result letter of this screening mammogram will be mailed directly to the patient. RECOMMENDATION: Screening mammogram in one year. (Code:SM-B-01Y) BI-RADS CATEGORY  1: Negative. Electronically Signed   By: Reyes Phi M.D.   On: 03/15/2023 17:22    Assessment & Plan:   Problem List Items Addressed This Visit     Abscess of left leg - Primary   Groin abscess-new.  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  addiction risk, apnea etc) and complications (i.e. Somnolence, constipation and others) were explained to the patient and were aknowledged.      Relevant Medications   HYDROcodone -acetaminophen  (NORCO) 5-325 MG tablet      Meds ordered this encounter  Medications   DISCONTD: doxycycline  (VIBRA -TABS) 100 MG tablet    Sig: Take 1 tablet (100 mg total) by mouth 2 (two) times daily.    Dispense:  28 tablet    Refill:  0   HYDROcodone -acetaminophen  (NORCO) 5-325 MG tablet    Sig: Take 1 tablet by mouth every 6 (six) hours as needed for moderate pain (pain score 4-6).    Dispense:  60 tablet    Refill:  0   mupirocin  ointment (BACTROBAN ) 2 %    Sig: On leg wound w/dressing change qd or bid    Dispense:  30 g    Refill:  0   doxycycline  (VIBRA -TABS) 100 MG tablet    Sig: Take 1 tablet (100 mg total) by mouth 2 (two) times daily.    Dispense:  28 tablet    Refill:  1      Follow-up: Return in about 2 weeks (around 03/29/2024) for a follow-up visit.  Marolyn Noel, MD

## 2024-03-15 NOTE — Assessment & Plan Note (Signed)
 Chronic low back pain Norco prn  Potential benefits of a short/long term opioids use as well as potential risks (i.e. addiction risk, apnea etc) and complications (i.e. Somnolence, constipation and others) were explained to the patient and were aknowledged.

## 2024-03-16 DIAGNOSIS — D631 Anemia in chronic kidney disease: Secondary | ICD-10-CM | POA: Diagnosis not present

## 2024-03-16 DIAGNOSIS — N2581 Secondary hyperparathyroidism of renal origin: Secondary | ICD-10-CM | POA: Diagnosis not present

## 2024-03-16 DIAGNOSIS — I129 Hypertensive chronic kidney disease with stage 1 through stage 4 chronic kidney disease, or unspecified chronic kidney disease: Secondary | ICD-10-CM | POA: Diagnosis not present

## 2024-03-16 DIAGNOSIS — Z720 Tobacco use: Secondary | ICD-10-CM | POA: Diagnosis not present

## 2024-03-16 DIAGNOSIS — N184 Chronic kidney disease, stage 4 (severe): Secondary | ICD-10-CM | POA: Diagnosis not present

## 2024-03-16 DIAGNOSIS — E1129 Type 2 diabetes mellitus with other diabetic kidney complication: Secondary | ICD-10-CM | POA: Diagnosis not present

## 2024-03-16 DIAGNOSIS — N189 Chronic kidney disease, unspecified: Secondary | ICD-10-CM | POA: Diagnosis not present

## 2024-03-16 DIAGNOSIS — E1122 Type 2 diabetes mellitus with diabetic chronic kidney disease: Secondary | ICD-10-CM | POA: Diagnosis not present

## 2024-03-19 NOTE — Telephone Encounter (Signed)
 Please send the prescription to the different pharmacy.  Start antibiotic on Monday.  Thanks

## 2024-03-19 NOTE — Telephone Encounter (Signed)
 Okay.  Please send new prescriptions.  Thanks

## 2024-03-20 ENCOUNTER — Encounter: Payer: Self-pay | Admitting: Podiatry

## 2024-03-20 ENCOUNTER — Ambulatory Visit (INDEPENDENT_AMBULATORY_CARE_PROVIDER_SITE_OTHER): Admitting: Podiatry

## 2024-03-20 DIAGNOSIS — E1142 Type 2 diabetes mellitus with diabetic polyneuropathy: Secondary | ICD-10-CM | POA: Diagnosis not present

## 2024-03-20 DIAGNOSIS — B351 Tinea unguium: Secondary | ICD-10-CM | POA: Diagnosis not present

## 2024-03-20 DIAGNOSIS — M79675 Pain in left toe(s): Secondary | ICD-10-CM

## 2024-03-20 DIAGNOSIS — M79674 Pain in right toe(s): Secondary | ICD-10-CM | POA: Diagnosis not present

## 2024-03-20 NOTE — Progress Notes (Signed)
  Subjective:  Patient ID: [name], female    DOB: [birth year],   MRN: [id number]  Chief Complaint  Patient presents with   Diabetes    Cut my toenails.  Saw Dr. Lela Fendt - 10/20/2023; A1c - 6.5    73 y.o. female presents for concern of thickened elongated and painful nails that are difficult to trim. Requesting to have them trimmed today. Relates burning and tingling in their feet. Patient is diabetic and last A1c was  Lab Results  Component Value Date   HGBA1C 6.5 (A) 10/20/2023   .  Relates pain in her feet and wondering about diabetic shoes  PCP:  [name], [name], MD    . Denies any other pedal complaints. Denies n/v/f/c.   Past Medical History:  Diagnosis Date   Anemia    COPD (chronic obstructive pulmonary disease) (HCC)    Diabetes mellitus    Hyperlipidemia    Hypertension    Ischemic cardiomyopathy 06/11/2013   Low back pain    NSTEMI (non-ST elevated myocardial infarction) (HCC) 06/11/2013   Obesity    Personal history of colon cancer    Renal insufficiency     Objective:  Physical Exam: Vascular: DP/PT pulses 2/4 bilateral. CFT <3 seconds. Absent hair growth on digits. Edema noted to bilateral lower extremities. Xerosis noted bilaterally.  Skin. No lacerations or abrasions bilateral feet. Nails 1-5 bilateral  are thickened discolored and elongated with subungual debris.  Musculoskeletal: MMT 5/5 bilateral lower extremities in DF, PF, Inversion and Eversion. Deceased ROM in DF of ankle joint. Pes planus noted bilateral with diminished fat pad to ball and heel of foot bilateral.  Neurological: Sensation intact to light touch. Protective sensation diminished bilateral.    Assessment:   1. Pain due to onychomycosis of toenails of both feet   2. Type 2 diabetes mellitus with peripheral neuropathy (HCC)       Plan:  Patient was evaluated and treated and all questions answered. -Discussed and educated patient on diabetic foot care,  especially with  regards to the vascular, neurological and musculoskeletal systems.  -Stressed the importance of good glycemic control and the detriment of not  controlling glucose levels in relation to the foot. -Discussed supportive shoes at all times and checking feet regularly.  -Mechanically debrided all nails 1-5 bilateral using sterile nail nipper and filed with dremel without incident  -Answered all patient questions -Patient to return  in 3 months for at risk foot care -Patient advised to call the office if any problems or questions arise in the meantime.   Asberry Failing, DPM

## 2024-03-21 NOTE — Telephone Encounter (Signed)
 Spoke with patient and she says she was able to get antibiotics and is doing a lot better. Says she has an appt on 9/24 for follow up but area burst last night. Also stated got a reminder call about AWV on 9/23 at 8:50 in-person and was asking for appt to be changed to phone visit. Told her I would ask about it and give her call back.

## 2024-03-24 ENCOUNTER — Ambulatory Visit (HOSPITAL_COMMUNITY)
Admission: RE | Admit: 2024-03-24 | Discharge: 2024-03-24 | Disposition: A | Discharge status: Home / Self Care | Source: Ambulatory Visit | Attending: Cardiology | Admitting: Cardiology

## 2024-03-24 DIAGNOSIS — I2510 Atherosclerotic heart disease of native coronary artery without angina pectoris: Secondary | ICD-10-CM | POA: Insufficient documentation

## 2024-03-24 DIAGNOSIS — E1159 Type 2 diabetes mellitus with other circulatory complications: Secondary | ICD-10-CM | POA: Insufficient documentation

## 2024-03-24 DIAGNOSIS — I152 Hypertension secondary to endocrine disorders: Secondary | ICD-10-CM | POA: Insufficient documentation

## 2024-03-24 DIAGNOSIS — I5032 Chronic diastolic (congestive) heart failure: Secondary | ICD-10-CM | POA: Diagnosis not present

## 2024-03-24 LAB — ECHOCARDIOGRAM COMPLETE
AR max vel: 3.29 cm2
AV Area VTI: 2.93 cm2
AV Area mean vel: 2.95 cm2
AV Mean grad: 3 mmHg
AV Peak grad: 5.9 mmHg
Ao pk vel: 1.21 m/s
Area-P 1/2: 2.71 cm2
S' Lateral: 2.3 cm

## 2024-03-25 ENCOUNTER — Ambulatory Visit: Payer: Self-pay | Admitting: Nurse Practitioner

## 2024-03-28 ENCOUNTER — Ambulatory Visit: Payer: 59

## 2024-03-29 ENCOUNTER — Ambulatory Visit: Admitting: Internal Medicine

## 2024-03-29 ENCOUNTER — Ambulatory Visit

## 2024-03-31 ENCOUNTER — Ambulatory Visit: Admitting: Internal Medicine

## 2024-03-31 ENCOUNTER — Encounter: Payer: Self-pay | Admitting: Internal Medicine

## 2024-03-31 VITALS — BP 136/70 | HR 71 | Temp 98.5°F | Ht 62.0 in | Wt 184.2 lb

## 2024-03-31 DIAGNOSIS — I152 Hypertension secondary to endocrine disorders: Secondary | ICD-10-CM

## 2024-03-31 DIAGNOSIS — R801 Persistent proteinuria, unspecified: Secondary | ICD-10-CM | POA: Diagnosis not present

## 2024-03-31 DIAGNOSIS — E1122 Type 2 diabetes mellitus with diabetic chronic kidney disease: Secondary | ICD-10-CM

## 2024-03-31 DIAGNOSIS — N184 Chronic kidney disease, stage 4 (severe): Secondary | ICD-10-CM

## 2024-03-31 DIAGNOSIS — R809 Proteinuria, unspecified: Secondary | ICD-10-CM | POA: Insufficient documentation

## 2024-03-31 DIAGNOSIS — E1159 Type 2 diabetes mellitus with other circulatory complications: Secondary | ICD-10-CM

## 2024-03-31 DIAGNOSIS — I2510 Atherosclerotic heart disease of native coronary artery without angina pectoris: Secondary | ICD-10-CM

## 2024-03-31 DIAGNOSIS — L02416 Cutaneous abscess of left lower limb: Secondary | ICD-10-CM

## 2024-03-31 MED ORDER — HYDRALAZINE HCL 25 MG PO TABS: 25.0000 mg | ORAL_TABLET | Freq: Three times a day (TID) | ORAL | 3 refills | Status: AC

## 2024-03-31 NOTE — Progress Notes (Addendum)
 Subjective:  Patient ID: [name], female    DOB: 11/03/1950  Age: 73 y.o. MRN: [id number]  CC: Follow-up (Patient states blood pressure medication hasn't been sent in. Patient is using Hydralazine , and Dr. Alveta retired. )   HPI Eliya Bubar presents for a boil - resolved F/u HTN - out of Rx; CRF  Outpatient Medications Prior to Visit  Medication Sig Dispense Refill   acetaminophen  (TYLENOL ) 325 MG tablet Take 325 mg by mouth every 6 (six) hours as needed.     allopurinol (ZYLOPRIM) 100 MG tablet Take 100 mg by mouth daily.     amLODipine  (NORVASC ) 10 MG tablet TAKE ONE-HALF TABLET BY MOUTH  DAILY 50 tablet 2   Ascorbic Acid (VITAMIN C) 1000 MG tablet Take 500 mg by mouth daily.     calcium  carbonate (OS-CAL) 600 MG TABS tablet Take 600 mg by mouth 2 (two) times daily with a meal.     Cholecalciferol (VITAMIN D3) 50 MCG (2000 UT) capsule Take 1 capsule (2,000 Units total) by mouth daily. 100 capsule 3   doxycycline  (VIBRA -TABS) 100 MG tablet Take 1 tablet (100 mg total) by mouth 2 (two) times daily. 28 tablet 1   ferrous sulfate  325 (65 FE) MG tablet Take 325 mg by mouth 2 (two) times daily with a meal.     glipiZIDE  (GLUCOTROL  XL) 5 MG 24 hr tablet TAKE 1 TABLET BY MOUTH DAILY  WITH BREAKFAST 100 tablet 2   HYDROcodone -acetaminophen  (NORCO) 5-325 MG tablet Take 1 tablet by mouth every 6 (six) hours as needed for moderate pain (pain score 4-6). 60 tablet 0   lansoprazole  (PREVACID ) 15 MG capsule Take 1 capsule (15 mg total) by mouth daily at 12 noon. 90 capsule 3   mupirocin  ointment (BACTROBAN ) 2 % On leg wound w/dressing change qd or bid 30 g 0   nebivolol  (BYSTOLIC ) 10 MG tablet TAKE 1 TABLET BY MOUTH EVERY DAY 90 tablet 3   nitroGLYCERIN  (NITROSTAT ) 0.4 MG SL tablet Place 1 tablet (0.4 mg total) under the tongue every 5 (five) minutes as needed for chest pain. 20 tablet 3   ondansetron  (ZOFRAN ) 4 MG tablet Take 1 tablet (4 mg total) by mouth every 8 (eight)  hours as needed for nausea or vomiting. 20 tablet 0   ONETOUCH ULTRA test strip USE TO TEST BLOOD SUGAR TWICE  DAILY AS DIRECTED 200 strip 2   potassium chloride  SA (KLOR-CON  M) 20 MEQ tablet TAKE 1 TABLET BY MOUTH DAILY 100 tablet 2   rosuvastatin  (CRESTOR ) 5 MG tablet TAKE 1 TABLET (5 MG TOTAL) BY MOUTH DAILY AT 6PM 90 tablet 2   torsemide  (DEMADEX ) 10 MG tablet Take 2.5 tablets (25 mg total) by mouth daily. 225 tablet 3   triamcinolone  cream (KENALOG ) 0.1 % Apply 1 Application topically 3 (three) times daily. On rash 160 g 3   vitamin B-12 (CYANOCOBALAMIN) 1000 MCG tablet Take 1,000 mcg by mouth daily.     hydrALAZINE  (APRESOLINE ) 25 MG tablet TAKE 1 TABLET BY MOUTH THREE TIMES A DAY 270 tablet 3   No facility-administered medications prior to visit.    ROS: Review of Systems  Constitutional:  Negative for activity change, appetite change, chills, fatigue and unexpected weight change.  HENT:  Negative for congestion, mouth sores and sinus pressure.   Eyes:  Negative for visual disturbance.  Respiratory:  Negative for cough and chest tightness.   Gastrointestinal:  Negative for abdominal pain and nausea.  Genitourinary:  Negative for  difficulty urinating, frequency and vaginal pain.  Musculoskeletal:  Positive for arthralgias and back pain. Negative for gait problem.  Skin:  Negative for pallor, rash and wound.  Neurological:  Negative for dizziness, tremors, weakness, numbness and headaches.  Psychiatric/Behavioral:  Negative for confusion, sleep disturbance and suicidal ideas. The patient is not nervous/anxious.     Objective:  BP 136/70   Pulse 71   Temp 98.5 F (36.9 C) (Oral)   Ht 5' 2 (1.575 m)   Wt 184 lb 3.2 oz (83.6 kg)   LMP  (LMP Unknown)   SpO2 97%   BMI 33.69 kg/m   BP Readings from Last 3 Encounters:  03/31/24 136/70  03/15/24 110/78  12/24/23 118/74    Wt Readings from Last 3 Encounters:  03/31/24 184 lb 3.2 oz (83.6 kg)  03/15/24 184 lb 3.2 oz (83.6  kg)  12/24/23 187 lb (84.8 kg)    Physical Exam Constitutional:      General: She is not in acute distress.    Appearance: She is well-developed. She is obese.  HENT:     Head: Normocephalic.     Right Ear: External ear normal.     Left Ear: External ear normal.     Nose: Nose normal.  Eyes:     General:        Right eye: No discharge.        Left eye: No discharge.     Conjunctiva/sclera: Conjunctivae normal.     Pupils: Pupils are equal, round, and reactive to light.  Neck:     Thyroid : No thyromegaly.     Vascular: No JVD.     Trachea: No tracheal deviation.  Cardiovascular:     Rate and Rhythm: Normal rate and regular rhythm.     Heart sounds: Normal heart sounds.  Pulmonary:     Effort: No respiratory distress.     Breath sounds: No stridor. No wheezing.  Abdominal:     General: Bowel sounds are normal. There is no distension.     Palpations: Abdomen is soft. There is no mass.     Tenderness: There is no abdominal tenderness. There is no guarding or rebound.  Musculoskeletal:        General: No tenderness.     Cervical back: Normal range of motion and neck supple. No rigidity.  Lymphadenopathy:     Cervical: No cervical adenopathy.  Skin:    Findings: No erythema or rash.  Neurological:     Mental Status: She is oriented to person, place, and time.     Cranial Nerves: No cranial nerve deficit.     Motor: No abnormal muscle tone.     Coordination: Coordination normal.     Gait: Gait normal.     Deep Tendon Reflexes: Reflexes normal.  Psychiatric:        Behavior: Behavior normal.        Thought Content: Thought content normal.        Judgment: Judgment normal.    LS w/pain   Lab Results  Component Value Date   WBC 6.4 02/12/2023   HGB 10.0 (L) 02/12/2023   HCT 33.0 (L) 02/12/2023   PLT 131 (L) 02/12/2023   GLUCOSE 127 (H) 02/12/2023   CHOL 129 12/16/2021   TRIG 78 12/16/2021   HDL 49 12/16/2021   LDLCALC 65 12/16/2021   ALT 11 12/16/2021   AST  14 07/17/2021   NA 139 02/12/2023   K 4.0 02/12/2023   CL  108 02/12/2023   CREATININE 2.88 (H) 02/12/2023   BUN 89 (H) 02/12/2023   CO2 18 (L) 02/12/2023   TSH 1.51 07/17/2021   INR 1.1 06/17/2020   HGBA1C 6.5 (A) 10/20/2023   MICROALBUR 13.9 (H) 10/13/2007    ECHOCARDIOGRAM COMPLETE Result Date: 03/24/2024    ECHOCARDIOGRAM REPORT   Patient Name:   DARIANN HUCKABA Date of Exam: 03/24/2024 Medical Rec #:  [id number]              Height:       62.0 in Accession #:    [id number]             Weight:       184.2 lb Date of Birth:  January 01, 1951               BSA:          1.846 m Patient Age:    73 years               BP:           179/82 mmHg Patient Gender: F                      HR:           63 bpm. Exam Location:  [address] Procedure: 2D Echo, Cardiac Doppler, Color Doppler and Strain Analysis (Both            Spectral and Color Flow Doppler were utilized during procedure). Indications:    CAD I25.10  History:        Patient has prior history of Echocardiogram examinations.                 Chronic Heart Failure with preserved EF., Previous Myocardial                 Infarction, COPD; Risk Factors:Hypertension and Diabetes.  Sonographer:    Cherene Ravens RDCS Referring Phys: [phone] JACKEE VEAR RADDLE DICK IMPRESSIONS  1. Left ventricular ejection fraction, by estimation, is 55 to 60%. The left ventricle has normal function. The left ventricle has no regional wall motion abnormalities. There is mild concentric left ventricular hypertrophy. Left ventricular diastolic parameters are consistent with Grade II diastolic dysfunction (pseudonormalization). The average left ventricular global longitudinal strain is -15.9 %. The global longitudinal strain is abnormal.  2. Right ventricular systolic function is normal. The right ventricular size is normal.  3. The mitral valve is normal in structure. No evidence of mitral valve regurgitation. No evidence of mitral stenosis.  4. The aortic valve is tricuspid. Aortic  valve regurgitation is mild to moderate. No aortic stenosis is present.  5. The inferior vena cava is normal in size with greater than 50% respiratory variability, suggesting right atrial pressure of 3 mmHg. FINDINGS  Left Ventricle: Left ventricular ejection fraction, by estimation, is 55 to 60%. The left ventricle has normal function. The left ventricle has no regional wall motion abnormalities. The average left ventricular global longitudinal strain is -15.9 %. Strain was performed and the global longitudinal strain is abnormal. The left ventricular internal cavity size was normal in size. There is mild concentric left ventricular hypertrophy. Left ventricular diastolic parameters are consistent with Grade II diastolic dysfunction (pseudonormalization). Right Ventricle: The right ventricular size is normal. No increase in right ventricular wall thickness. Right ventricular systolic function is normal. Left Atrium: Left atrial size was normal in size. Right Atrium: Right atrial size was normal in size.  Pericardium: There is no evidence of pericardial effusion. Mitral Valve: The mitral valve is normal in structure. No evidence of mitral valve regurgitation. No evidence of mitral valve stenosis. Tricuspid Valve: The tricuspid valve is normal in structure. Tricuspid valve regurgitation is mild . No evidence of tricuspid stenosis. Aortic Valve: The aortic valve is tricuspid. Aortic valve regurgitation is mild to moderate. No aortic stenosis is present. Aortic valve mean gradient measures 3.0 mmHg. Aortic valve peak gradient measures 5.9 mmHg. Aortic valve area, by VTI measures 2.93 cm. Pulmonic Valve: The pulmonic valve was normal in structure. Pulmonic valve regurgitation is mild. No evidence of pulmonic stenosis. Aorta: The aortic root is normal in size and structure. Venous: The inferior vena cava is normal in size with greater than 50% respiratory variability, suggesting right atrial pressure of 3 mmHg.  IAS/Shunts: No atrial level shunt detected by color flow Doppler.  LEFT VENTRICLE PLAX 2D LVIDd:         3.80 cm   Diastology LVIDs:         2.30 cm   LV e' medial:    5.11 cm/s LV PW:         1.30 cm   LV E/e' medial:  20.2 LV IVS:        1.10 cm   LV e' lateral:   8.24 cm/s LVOT diam:     2.30 cm   LV E/e' lateral: 12.5 LV SV:         97 LV SV Index:   52        2D Longitudinal Strain LVOT Area:     4.15 cm  2D Strain GLS (A4C):   -17.0 % LV IVRT:       69 msec   2D Strain GLS (A3C):   -14.9 %                          2D Strain GLS (A2C):   -15.9 %                          2D Strain GLS Avg:     -15.9 % RIGHT VENTRICLE             IVC RV Basal diam:  3.20 cm     IVC diam: 1.50 cm RV Mid diam:    2.90 cm RV S prime:     14.30 cm/s TAPSE (M-mode): 3.0 cm RVSP:           30.7 mmHg LEFT ATRIUM             Index        RIGHT ATRIUM           Index LA diam:        4.00 cm 2.17 cm/m   RA Pressure: 3.00 mmHg LA Vol (A2C):   74.7 ml 40.47 ml/m  RA Area:     9.66 cm LA Vol (A4C):   60.8 ml 32.94 ml/m  RA Volume:   16.80 ml  9.10 ml/m LA Biplane Vol: 67.8 ml 36.73 ml/m  AORTIC VALVE AV Area (Vmax):    3.29 cm AV Area (Vmean):   2.95 cm AV Area (VTI):     2.93 cm AV Vmax:           121.00 cm/s AV Vmean:          88.200 cm/s AV VTI:  0.330 m AV Peak Grad:      5.9 mmHg AV Mean Grad:      3.0 mmHg LVOT Vmax:         95.90 cm/s LVOT Vmean:        62.600 cm/s LVOT VTI:          0.233 m LVOT/AV VTI ratio: 0.71  AORTA Ao Root diam: 3.20 cm Ao Asc diam:  3.10 cm MITRAL VALVE                TRICUSPID VALVE MV Area (PHT): 2.71 cm     TR Peak grad:   27.7 mmHg MV Decel Time: 280 msec     TR Vmax:        263.00 cm/s MV E velocity: 103.00 cm/s  Estimated RAP:  3.00 mmHg MV A velocity: 146.00 cm/s  RVSP:           30.7 mmHg MV E/A ratio:  0.71                             SHUNTS                             Systemic VTI:  0.23 m                             Systemic Diam: 2.30 cm Morene Brownie Electronically signed by  Morene Brownie Signature Date/Time: 03/24/2024/8:54:58 PM    Final     Assessment & Plan:   Problem List Items Addressed This Visit     Abscess of left leg   Resolved on Doxy      CAD (coronary artery disease)   On Aspirin , Crestor  No CP        Relevant Medications   hydrALAZINE  (APRESOLINE ) 25 MG tablet   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    Dispense:  270 tablet    Refill:  3      Follow-up: Return in about 6 months (around 09/28/2024) for a follow-up visit.  Marolyn Noel, MD

## 2024-03-31 NOTE — Assessment & Plan Note (Addendum)
 Discussed - f/u w/Dr Gearline Continue with Bystolic , Hydralazine , Torsemide  Off Rx due to low BP

## 2024-03-31 NOTE — Assessment & Plan Note (Addendum)
 CKD 4 Discussed - f/u w/Dr Gearline Continue with Bystolic , Hydralazine , Torsemide  Off Rx due to low BP

## 2024-03-31 NOTE — Assessment & Plan Note (Signed)
 On Aspirin , Crestor  No CP

## 2024-03-31 NOTE — Assessment & Plan Note (Signed)
Resolved on Doxy

## 2024-03-31 NOTE — Assessment & Plan Note (Signed)
 Continue with Bystolic, Hydralazine, Torsemide Off Rx due to low BP

## 2024-04-18 NOTE — Progress Notes (Unsigned)
 Patient ID: [name], female   DOB: 1950-10-03, 73 y.o.   MRN: [id number]  HPI: [name] is a 73 y.o.-year-old female, returning for f/u DM2, dx in 2006, insulin -independent, uncontrolled, with complications (CAD - s/p AMI 06/2013, CHF; CKD; PN; DR). Last visit 6 months ago.  Interim history: No increased urination, blurry vision, nausea, chest pain.  Reviewed HbA1c levels: Lab Results  Component Value Date   HGBA1C 6.5 (A) 10/20/2023   HGBA1C 7.0 (A) 03/15/2023   HGBA1C 6.9 (A) 09/11/2022  05/12/2021: HbA1c calculated from fructosamine is: 7.35%, lower than the directly measured HbA1c. 05/16/2020: HbA1c calculated from fructosamine is: 7.4%, lower than the directly measured HbA1c. 05/02/2019: HbA1c calculated from fructosamine was 7.0% 12/30/2018: HbA1c calculated from fructosamine is higher, at 8.2%, possibly 2/2 drinking juice 08/24/2018: HbA1c calculated from fructosamine is higher, at 8%, possibly 2/2 ABx  04/20/2018: HbA1c calculated from fructosamine is slightly better than before, at 7% 12/15/2017: HbA1c calculated from fructosamine: 7.17%, slightly improved from last visit  08/17/2017: HbA1c calculated from fructosamine: 7.2%, slightly improved 04/15/2017: HbA1c calculated from the fructosamine is lower than measured, at 7.3% 08/21/2016: HbA1c 6.0%. She started to change her diet after her hemoglobin A1c returned high, at 16.2% in 09/2014. She cut down portions, changed the meal content to include more fiber and less carbs.  HbA1c decreased dramatically.  Pt is on: - Glipizide  ER 5 5 mg before breakfast (10 mg during steroid treatment) >> 5-10 >> 5 mg in a.m. She stopped metformin  ER in 01/2017. She tried Tradjenta  5 mg daily in am >> CP with it (started 10/2014) >> had to stop She tried Farxiga  >> decreased [name] and yeast inf. She tried regular metformin  >> diarrhea She refused insulin  in the past.  Pt checks her sugars twice a day per review of her  log: - am:  70-138 >> 77, 84-121, 127 >> 92-136 >> 90-120 - 2h after b'fast:  129-144 >> 130-140 >> 114 >> n/c  - before lunch: 125 >> 89, 121-151 >> 103-141, 158 >> n/c - 2h after lunch: 169 >> 120-130 >> n/c  - before dinner:  110-144 >> 118-130 >> n/c - 2h after dinner: 70, 120-144, 147 >> 89-148 >> 127-150s - bedtime: 137 >> n/c >> 147-157 >> n/c - nighttime: n/c Lowest sugar was   90 >> 77>> 90; it is unclear at which level she has hypoglycemia awareness. Highest sugar was 400 (Prednisone) >> .[name]. 147 >> 150.  Glucometer: One Touch Ultra  Pt's meals are: - Breakfast: oatmeal, cereals, [place] bacon, egg toast; cinnamon on oatmeal or cheerios - Lunch: sandwich or fruit salad - Dinner: salmon/chicken/turkey, Brussel sprouts, other veggies, sweet potatoes or brown rice, spaghetti  - Snacks: 2: carrots with dip; yoghurt with fruit, veggies + dip; fruit  She walks for exercise. She saw Dr. Gearline with nephrology.  She was started on a high potassium, low protein diet  -+ CKD-sees nephrology (Dr. Gearline); latest BUN/creatinine:  Lab Results  Component Value Date   BUN 89 (H) 02/12/2023   CREATININE 2.88 (H) 02/12/2023  06/10/2022: Protein/creatinine ratio 259 (0-200) 02/05/2022: 66/2.72, [name] 18, glucose 136, protein to creatinine ratio 105 (0-200), ACR 37 (0-29) 08/09/2020: 68/2.15, [name] 26, glucose 145, protein to creatinine ratio 68, ACR 9 She stopped olmesartan due to worsening kidney function and cough.  -+ HL; last set of lipids:  Lab Results  Component Value Date   CHOL 129 12/16/2021   HDL 49 12/16/2021   LDLCALC 65 12/16/2021  TRIG 78 12/16/2021   CHOLHDL 2.6 12/16/2021  On Crestor  5 daily.  - last eye exam was on 08/02/2023: + PDR OU with macular edema, + cataract reportedly.  Had laser surgeries. Dr. Tobie.   - she has numbness and tingling in her feet.  Last foot exam 11/30/2023 by Dr. Sikora. Needs Diabetic shoes.  She also has a history of HTN, GERD, and anemia. In  02/2020, she was admitted multiple perineal abscesses and cellulitis.  These were I&D and she was started on antibiotics. She was in [facility] for 2 weeks - she did not like her care there, including the meals. Of note, patient discussed with nephrology about dialysis and she would never want to proceed with this. She was started on Eliquis  after a traumatic DVT in 11/2021.   Goiter: A thyroid  ultrasound from 2016 showed only small cysts. A CT scan (02/12/2023): nonnodular goiter  Pt denies: - feeling nodules in neck - hoarseness - dysphagia - choking  Latest TSH normal: Lab Results  Component Value Date   TSH 1.51 07/17/2021   ROS: + see HPI  I reviewed pt's medications, allergies, PMH, social hx, family hx, and changes were documented in the history of present illness. Otherwise, unchanged from my initial visit note.  Past Medical History:  Diagnosis Date   Anemia    COPD (chronic obstructive pulmonary disease) (HCC)    Diabetes mellitus    Hyperlipidemia    Hypertension    Ischemic cardiomyopathy 06/11/2013   Low back pain    NSTEMI (non-ST elevated myocardial infarction) (HCC) 06/11/2013   Obesity    Personal history of colon cancer    Renal insufficiency    Past Surgical History:  Procedure Laterality Date   COLECTOMY     INCISION AND DRAINAGE PERIRECTAL ABSCESS N/A 03/07/2020   Procedure: IRRIGATION AND DEBRIDEMENT PERINEUM  AND PANNUS ABSCESS;  Surgeon: Vanderbilt Ned, MD;  Location: [facility] OR;  Service: General;  Laterality: N/A;   IR RADIOLOGIST EVAL & MGMT  08/06/2020   LEFT HEART CATHETERIZATION WITH CORONARY ANGIOGRAM N/A 06/09/2013   Procedure: LEFT HEART CATHETERIZATION WITH CORONARY ANGIOGRAM;  Surgeon: Mickey Aleene JINNY Alveta, MD;  Location: [facility] CATH LAB;  Service: Cardiovascular;  Laterality: N/A;   History   Social History   Marital Status: Single    Spouse Name: N/A   Number of Children: 0   Occupational History   retired   Social History Main Topics    Smoking status:  former smoker     Types: Cigarettes   Smokeless tobacco: Never Used   Alcohol Use: No   Drug Use: No   Current Outpatient Medications on File Prior to Visit  Medication Sig Dispense Refill   acetaminophen  (TYLENOL ) 325 MG tablet Take 325 mg by mouth every 6 (six) hours as needed.     allopurinol (ZYLOPRIM) 100 MG tablet Take 100 mg by mouth daily.     amLODipine  (NORVASC ) 10 MG tablet TAKE ONE-HALF TABLET BY MOUTH  DAILY 50 tablet 2   Ascorbic Acid (VITAMIN C) 1000 MG tablet Take 500 mg by mouth daily.     calcium  carbonate (OS-CAL) 600 MG TABS tablet Take 600 mg by mouth 2 (two) times daily with a meal.     Cholecalciferol (VITAMIN D3) 50 MCG (2000 UT) capsule Take 1 capsule (2,000 Units total) by mouth daily. 100 capsule 3   doxycycline  (VIBRA -TABS) 100 MG tablet Take 1 tablet (100 mg total) by mouth 2 (two) times daily. 28 tablet  1   ferrous sulfate  325 (65 FE) MG tablet Take 325 mg by mouth 2 (two) times daily with a meal.     glipiZIDE  (GLUCOTROL  XL) 5 MG 24 hr tablet TAKE 1 TABLET BY MOUTH DAILY  WITH BREAKFAST 100 tablet 2   hydrALAZINE  (APRESOLINE ) 25 MG tablet Take 1 tablet (25 mg total) by mouth 3 (three) times daily. 270 tablet 3   HYDROcodone -acetaminophen  (NORCO) 5-325 MG tablet Take 1 tablet by mouth every 6 (six) hours as needed for moderate pain (pain score 4-6). 60 tablet 0   lansoprazole  (PREVACID ) 15 MG capsule Take 1 capsule (15 mg total) by mouth daily at 12 noon. 90 capsule 3   mupirocin  ointment (BACTROBAN ) 2 % On leg wound w/dressing change qd or bid 30 g 0   nebivolol  (BYSTOLIC ) 10 MG tablet TAKE 1 TABLET BY MOUTH EVERY DAY 90 tablet 3   nitroGLYCERIN  (NITROSTAT ) 0.4 MG SL tablet Place 1 tablet (0.4 mg total) under the tongue every 5 (five) minutes as needed for chest pain. 20 tablet 3   ondansetron  (ZOFRAN ) 4 MG tablet Take 1 tablet (4 mg total) by mouth every 8 (eight) hours as needed for nausea or vomiting. 20 tablet 0   ONETOUCH ULTRA test strip  USE TO TEST BLOOD SUGAR TWICE  DAILY AS DIRECTED 200 strip 2   potassium chloride  SA (KLOR-CON  M) 20 MEQ tablet TAKE 1 TABLET BY MOUTH DAILY 100 tablet 2   rosuvastatin  (CRESTOR ) 5 MG tablet TAKE 1 TABLET (5 MG TOTAL) BY MOUTH DAILY AT 6PM 90 tablet 2   torsemide  (DEMADEX ) 10 MG tablet Take 2.5 tablets (25 mg total) by mouth daily. 225 tablet 3   triamcinolone  cream (KENALOG ) 0.1 % Apply 1 Application topically 3 (three) times daily. On rash 160 g 3   vitamin B-12 (CYANOCOBALAMIN) 1000 MCG tablet Take 1,000 mcg by mouth daily.     No current facility-administered medications on file prior to visit.   Allergies  Allergen Reactions   Tradjenta  [Linagliptin ] Anaphylaxis    CP   Albuterol      Palpitations   Atorvastatin  Other (See Comments)    Patient unsure of allergy.   Biobrane Gloves Large [Wound Dressings]    Enalapril Maleate     REACTION: cough   Farxiga  [Dapagliflozin ] Itching   Hydrochlorothiazide     REACTION: hair loss   Kenalog  [Triamcinolone  Acetonide]     HANDS NUMB    Levaquin [Levofloxacin] Hives   Lisinopril    Metformin  And Related     Diarrhea, dizziness   Propoxyphene N-Acetaminophen  Hives and Other (See Comments)    Patient unsure of allergy.   Simvastatin     REACTION: cramps   Spironolactone     REACTION: cramps   Family History  Problem Relation Age of Onset   Hypertension Other    Breast cancer Neg Hx    PE: LMP  (LMP Unknown)   Wt Readings from Last 3 Encounters:  03/31/24 184 lb 3.2 oz (83.6 kg)  03/15/24 184 lb 3.2 oz (83.6 kg)  12/24/23 187 lb (84.8 kg)   Constitutional: overweight, in NAD Eyes: EOMI, no exophthalmos ENT: no thyromegaly, no cervical lymphadenopathy Cardiovascular: RRR, No RG, + 1/6 SEM, + L>R LE edema Respiratory: CTA B Musculoskeletal: no deformities Skin: no rashes Neurological: no tremor with outstretched hands  ASSESSMENT: 1. DM2, insulin -independent, uncontrolled, with complications - CAD, s/p AMI 06/2013 - Dr  Alveta - CHF - CKD - PN - DR - Dr. Onesimo Blanch (  [facility]) - on IO injections >> improving  2. Goiter - No neck compression symptoms  - 10/19/2014: thyroid  ultrasound: Right thyroid  lobe: 4.0 x 1.5 x 1.6 cm. Heterogeneous parenchyma with multiple small cysts identified. The largest measures approximately 0.5 x 0.3 x 0.5 cm. All of the small cysts in the right lobe appears simple and benign.  Left thyroid  lobe: 4.5 x 1.4 x 2.0 cm. Heterogeneous parenchyma with multiple cysts. The largest measures approximately 1.0 x 0.4 x 0.5 cm. 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 Similar smaller cyst measures 0.8 cm in greatest diameter. There is a small solid nodule measuring 0.7 x 0.4 x 0.6 cm.  Isthmus Thickness: 0.4 cm.  No nodules visualized.  Lymphadenopathy: None visualized.            Multicystic thyroid .  3. HL  PLAN:  1. Patient with longstanding, uncontrolled, type 2 diabetes, on sulfonylurea only after she came off metformin  due to improved diabetes control in the setting of improved diet.  She switched to a more plant-based diet and reduced processed foods and sweets.  However, afterwards, she relaxed her diet and an HbA1c increased.  At last visit, however, it improved to 6.5%. - We discussed at last visit about possibly using an SGLT2 inhibitor.  However, she had intolerance to Farxiga : Yeast infections.  We discussed about possibly trying Jardiance and advised her to discuss with her nephrologist and see if this would be a good option for her.  We did not change the dose of the glipizide  ER.  She does double the dose whenever she has steroids. -At today's visit, I reviewed the latest nephrology note from 03/24/2024.  Per Dr. Gearline, her [name] is too low for SGLT2 inhibitor. -I advised her to: Patient Instructions  Please continue: - Glipizide  ER 5 mg before breakfast  Please return in 4-6 months with your sugar log.  - we checked her  HbA1c: 7%  - advised to check sugars at different times of the day - 1x a day, rotating check times - advised for yearly eye exams >> she is UTD - return to clinic in 4-6 months   2. Goiter - No neck compression symptoms - On the latest thyroid  ultrasound, she only had small thyroid  cysts - Before last visit, she had a chest CT and was advised that her thyroid  was enlarged.  At last visit, we reviewed the report together.  I advised her that we were aware of the goiter but no intervention was needed.   - Latest TSH was normal: Lab Results  Component Value Date   TSH 1.51 07/17/2021  - We are following her clinically now  3. HL and 4.  Statin intolerance - Latest lipid panel from 11/02/2023 was reviewed: LDL higher than our goal of less than 55, otherwise fractions at goal:  - She could not tolerate statins due to muscle cramps  Lela Fendt, MD PhD [facility] Endocrin[facility]

## 2024-04-19 ENCOUNTER — Ambulatory Visit (INDEPENDENT_AMBULATORY_CARE_PROVIDER_SITE_OTHER): Admitting: Internal Medicine

## 2024-04-19 ENCOUNTER — Encounter: Payer: Self-pay | Admitting: Internal Medicine

## 2024-04-19 VITALS — BP 120/70 | HR 78 | Ht 62.0 in | Wt 186.6 lb

## 2024-04-19 DIAGNOSIS — E785 Hyperlipidemia, unspecified: Secondary | ICD-10-CM

## 2024-04-19 DIAGNOSIS — E049 Nontoxic goiter, unspecified: Secondary | ICD-10-CM | POA: Diagnosis not present

## 2024-04-19 DIAGNOSIS — Z7984 Long term (current) use of oral hypoglycemic drugs: Secondary | ICD-10-CM

## 2024-04-19 DIAGNOSIS — E1165 Type 2 diabetes mellitus with hyperglycemia: Secondary | ICD-10-CM | POA: Diagnosis not present

## 2024-04-19 DIAGNOSIS — E1159 Type 2 diabetes mellitus with other circulatory complications: Secondary | ICD-10-CM

## 2024-04-19 LAB — POCT GLYCOSYLATED HEMOGLOBIN (HGB A1C): Hemoglobin A1C: 6.6 % — AB (ref 4.0–5.6)

## 2024-04-19 NOTE — Patient Instructions (Signed)
Please continue: ?- Glipizide ER 5 mg before breakfast ? ?Please return in 4-6 months with your sugar log. ?

## 2024-04-21 NOTE — Addendum Note (Signed)
 Addended by: CLEOTILDE ROLIN RAMAN on: 04/21/2024 07:56 AM   Modules accepted: Orders

## 2024-04-27 ENCOUNTER — Other Ambulatory Visit: Payer: Self-pay

## 2024-04-27 NOTE — Telephone Encounter (Signed)
 Forwarded back to Dr. Garald to finish prescription.

## 2024-04-29 ENCOUNTER — Other Ambulatory Visit: Payer: Self-pay | Admitting: Internal Medicine

## 2024-05-18 ENCOUNTER — Ambulatory Visit (INDEPENDENT_AMBULATORY_CARE_PROVIDER_SITE_OTHER)

## 2024-05-18 VITALS — Ht 62.0 in | Wt 186.0 lb

## 2024-05-18 DIAGNOSIS — K635 Polyp of colon: Secondary | ICD-10-CM | POA: Diagnosis not present

## 2024-05-18 DIAGNOSIS — Z1231 Encounter for screening mammogram for malignant neoplasm of breast: Secondary | ICD-10-CM

## 2024-05-18 DIAGNOSIS — Z0000 Encounter for general adult medical examination without abnormal findings: Secondary | ICD-10-CM

## 2024-05-18 DIAGNOSIS — Z1159 Encounter for screening for other viral diseases: Secondary | ICD-10-CM

## 2024-05-18 NOTE — Progress Notes (Cosign Needed Addendum)
 Chief Complaint  Patient presents with   Medicare Wellness     Subjective:   [name] is a 73 y.o. female who presents for a Medicare Annual Wellness Visit.  I connected with  [name] on 05/18/24 by a audio enabled telemedicine application and verified that I am speaking with the correct person using two identifiers.  Patient Location: Home  Provider Location: Office/Clinic  Persons Participating in Visit: Patient.  I discussed the limitations of evaluation and management by telemedicine. The patient expressed understanding and agreed to proceed.  Vital Signs: Because this visit was a virtual/telehealth visit, some criteria may be missing or patient reported. Any vitals not documented were not able to be obtained and vitals that have been documented are patient reported.   Allergies (verified) Tradjenta  [linagliptin ], Albuterol , Atorvastatin , Biobrane gloves large [wound dressings], Enalapril maleate, Farxiga  [dapagliflozin ], Hydrochlorothiazide, Kenalog  [triamcinolone  acetonide], Levaquin [levofloxacin], Lisinopril, Metformin  and related, Propoxyphene n-acetaminophen , Simvastatin, and Spironolactone   History: Past Medical History:  Diagnosis Date   Anemia    COPD (chronic obstructive pulmonary disease) (HCC)    Diabetes mellitus    Hyperlipidemia    Hypertension    Ischemic cardiomyopathy 06/11/2013   Low back pain    NSTEMI (non-ST elevated myocardial infarction) (HCC) 06/11/2013   Obesity    Personal history of colon cancer    Renal insufficiency    Past Surgical History:  Procedure Laterality Date   COLECTOMY     INCISION AND DRAINAGE PERIRECTAL ABSCESS N/A 03/07/2020   Procedure: IRRIGATION AND DEBRIDEMENT PERINEUM  AND PANNUS ABSCESS;  Surgeon: Vanderbilt Ned, MD;  Location: [facility] OR;  Service: General;  Laterality: N/A;   IR RADIOLOGIST EVAL & MGMT  08/06/2020   LEFT HEART CATHETERIZATION WITH CORONARY ANGIOGRAM N/A 06/09/2013   Procedure:  LEFT HEART CATHETERIZATION WITH CORONARY ANGIOGRAM;  Surgeon: Mickey Aleene JINNY Alveta, MD;  Location[facility] CATH LAB;  Service: Cardiovascular;  Laterality: N/A;   Family History  Problem Relation Age of Onset   Hypertension Other    Breast cancer Neg Hx    Social History   Occupational History   Not on file  Tobacco Use   Smoking status: Some Days    Types: Cigarettes   Smokeless tobacco: Never  Vaping Use   Vaping status: Never Used  Substance and Sexual Activity   Alcohol use: No   Drug use: No   Sexual activity: Not Currently    Birth control/protection: Post-menopausal   Tobacco Counseling Ready to quit: No Counseling given: Yes  SDOH Screenings   Food Insecurity: No Food Insecurity (05/18/2024)  Housing: Unknown (05/18/2024)  Transportation Needs: No Transportation Needs (05/18/2024)  Utilities: Not At Risk (05/18/2024)  Alcohol Screen: Low Risk  (03/25/2023)  Depression (PHQ2-9): Low Risk  (05/18/2024)  Financial Resource Strain: High Risk (12/08/2023)  Physical Activity: Insufficiently Active (05/18/2024)  Social Connections: Moderately Integrated (05/18/2024)  Stress: No Stress Concern Present (05/18/2024)  Tobacco Use: High Risk (05/18/2024)  Health Literacy: Adequate Health Literacy (05/18/2024)   See flowsheets for full screening details  Depression Screen PHQ 2 & 9 Depression Scale- Over the past 2 weeks, how often have you been bothered by any of the following problems? Little interest or pleasure in doing things: 0 Feeling down, depressed, or hopeless (PHQ Adolescent also includes...irritable): 0 PHQ-2 Total Score: 0 Trouble falling or staying asleep, or sleeping too much: 0 Feeling tired or having little energy: 0 Poor appetite or overeating (PHQ Adolescent also includes...weight loss): 0 Feeling bad  about yourself - or that you are a failure or have let yourself or your family down: 0 Trouble concentrating on things, such as reading the newspaper or watching  television (PHQ Adolescent also includes...like school work): 0 Moving or speaking so slowly that other people could have noticed. Or the opposite - being so fidgety or restless that you have been moving around a lot more than usual: 0 Thoughts that you would be better off dead, or of hurting yourself in some way: 0 PHQ-9 Total Score: 0 If you checked off any problems, how difficult have these problems made it for you to do your work, take care of things at home, or get along with other people?: Not difficult at all  Depression Treatment Depression Interventions/Treatment : EYV7-0 Score <4 Follow-up Not Indicated     Goals Addressed               This Visit's Progress     Patient Stated (pt-stated)        Patient stated she plans to walk more and watch her diet; and manage sugar levels       Visit info / Clinical Intake: Medicare Wellness Visit Type:: Subsequent Annual Wellness Visit Persons participating in visit:: patient Medicare Wellness Visit Mode:: Telephone If telephone:: video declined Because this visit was a virtual/telehealth visit:: vitals recorded from last visit If Telephone or Video please confirm:: I connected with the patient using audio enabled telemedicine application and verified that I am speaking with the correct person using two identifiers; I discussed the limitations of evaluation and management by telemedicine; The patient expressed understanding and agreed to proceed Patient Location:: Home Provider Location:: Office Information given by:: patient Interpreter Needed?: No Pre-visit prep was completed: yes AWV questionnaire completed by patient prior to visit?: no Living arrangements:: (!) lives alone Patient's Overall Health Status Rating: good Typical amount of pain: none Does pain affect daily life?: no Are you currently prescribed opioids?: (!) yes (Hydrocodone )  Dietary Habits and Nutritional Risks How many meals a day?: 4 Eats fruit and  vegetables daily?: yes Most meals are obtained by: preparing own meals In the last 2 weeks, have you had any of the following?: none Diabetic:: (!) yes Any non-healing wounds?: no How often do you check your BS?: 2 (fasting - 132) Would you like to be referred to a Nutritionist or for Diabetic Management? : no  Functional Status Activities of Daily Living (to include ambulation/medication): Independent Ambulation: Independent with device- listed below Home Assistive Devices/Equipment: Eyeglasses; Dentures (specify type) Medication Administration: Independent Home Management: Independent Manage your own finances?: yes Primary transportation is: facility / other (uses [employer]) Concerns about vision?: no *vision screening is required for WTM* Concerns about hearing?: no  Fall Screening Falls in the past year?: 0 Number of falls in past year: 0 Was there an injury with Fall?: 0 Fall Risk Category Calculator: 0 Patient Fall Risk Level: Low Fall Risk  Fall Risk Patient at Risk for Falls Due to: No Fall Risks Fall risk Follow up: Falls evaluation completed; Falls prevention discussed  Home and Transportation Safety: All rugs have non-skid backing?: yes All stairs or steps have railings?: (!) no (outside) Grab bars in the bathtub or shower?: (!) no Have non-skid surface in bathtub or shower?: yes Good home lighting?: yes Regular seat belt use?: yes Hospital stays in the last year:: no  Cognitive Assessment Difficulty concentrating, remembering, or making decisions? : no Will 6CIT or Mini Cog be Completed: yes  What year is it?: 0 points What month is it?: 0 points Give patient an address phrase to remember (5 components): [address] [place], [place] About what time is it?: 0 points Count backwards from 20 to 1: 0 points Say the months of the year in reverse: 0 points Repeat the address phrase from earlier: 2 points (Drive) 6 CIT Score: 2 points  Advance  Directives (For Healthcare) Does Patient Have a Medical Advance Directive?: No Would patient like information on creating a medical advance directive?: No - Patient declined  Reviewed/Updated  Reviewed/Updated: Reviewed All (Medical, Surgical, Family, Medications, Allergies, Care Teams, Patient Goals)        Objective:    Today's Vitals   05/18/24 1158  Weight: 186 lb (84.4 kg)  Height: 5' 2 (1.575 m)   Body mass index is 34.02 kg/m.  Current Medications (verified) Outpatient Encounter Medications as of 05/18/2024  Medication Sig   acetaminophen  (TYLENOL ) 325 MG tablet Take 325 mg by mouth every 6 (six) hours as needed.   allopurinol (ZYLOPRIM) 100 MG tablet Take 100 mg by mouth daily.   amLODipine  (NORVASC ) 10 MG tablet TAKE ONE-HALF TABLET BY MOUTH  DAILY   Ascorbic Acid (VITAMIN C) 1000 MG tablet Take 500 mg by mouth daily.   calcium  carbonate (OS-CAL) 600 MG TABS tablet Take 600 mg by mouth 2 (two) times daily with a meal.   Cholecalciferol (VITAMIN D3) 50 MCG (2000 UT) capsule Take 1 capsule (2,000 Units total) by mouth daily.   doxycycline  (VIBRA -TABS) 100 MG tablet Take 1 tablet (100 mg total) by mouth 2 (two) times daily.   ferrous sulfate  325 (65 FE) MG tablet Take 325 mg by mouth 2 (two) times daily with a meal.   glipiZIDE  (GLUCOTROL  XL) 5 MG 24 hr tablet TAKE 1 TABLET BY MOUTH DAILY  WITH BREAKFAST   hydrALAZINE  (APRESOLINE ) 25 MG tablet Take 1 tablet (25 mg total) by mouth 3 (three) times daily.   HYDROcodone -acetaminophen  (NORCO) 5-325 MG tablet Take 1 tablet by mouth every 6 (six) hours as needed for moderate pain (pain score 4-6).   lansoprazole  (PREVACID ) 15 MG capsule Take 1 capsule (15 mg total) by mouth daily at 12 noon.   mupirocin  ointment (BACTROBAN ) 2 % On leg wound w/dressing change qd or bid   nebivolol  (BYSTOLIC ) 10 MG tablet TAKE 1 TABLET BY MOUTH EVERY DAY   nitroGLYCERIN  (NITROSTAT ) 0.4 MG SL tablet Place 1 tablet (0.4 mg total) under the tongue  every 5 (five) minutes as needed for chest pain.   ondansetron  (ZOFRAN ) 4 MG tablet Take 1 tablet (4 mg total) by mouth every 8 (eight) hours as needed for nausea or vomiting.   ONETOUCH ULTRA test strip USE TO TEST BLOOD SUGAR TWICE  DAILY AS DIRECTED   potassium chloride  SA (KLOR-CON  M) 20 MEQ tablet TAKE 1 TABLET BY MOUTH DAILY   rosuvastatin  (CRESTOR ) 5 MG tablet TAKE 1 TABLET (5 MG TOTAL) BY MOUTH DAILY AT 6PM   torsemide  (DEMADEX ) 10 MG tablet Take 2.5 tablets (25 mg total) by mouth daily.   triamcinolone  cream (KENALOG ) 0.1 % Apply 1 Application topically 3 (three) times daily. On rash   vitamin B-12 (CYANOCOBALAMIN) 1000 MCG tablet Take 1,000 mcg by mouth daily.   No facility-administered encounter medications on file as of 05/18/2024.   Hearing/Vision screen Hearing Screening - Comments:: Denies hearing difficulties   Vision Screening - Comments:: Wears rx glasses - up to date with routine eye exams with GEANNIE Blanch Immunizations  and Health Maintenance Health Maintenance  Topic Date Due   Hepatitis C Screening  Never done   Colonoscopy  09/30/2022   Diabetic kidney evaluation - eGFR measurement  02/12/2024   Mammogram  03/11/2024   Diabetic kidney evaluation - Urine ACR  08/04/2024 (Originally 10/09/2023)   Zoster Vaccines- Shingrix (1 of 2) 08/18/2024 (Originally 10/09/2000)   DTaP/Tdap/Td (1 - Tdap) 05/18/2025 (Originally 10/09/1969)   Pneumococcal Vaccine: 50+ Years (1 of 2 - PCV) 05/18/2025 (Originally 10/09/1969)   HEMOGLOBIN A1C  10/18/2024   FOOT EXAM  11/29/2024   OPHTHALMOLOGY EXAM  01/26/2025   Medicare Annual Wellness (AWV)  05/18/2025   DEXA SCAN  Completed   Meningococcal B Vaccine  Aged Out   Influenza Vaccine  Discontinued   COVID-19 Vaccine  Discontinued        Assessment/Plan:  This is a routine wellness examination for [name].  Patient Care Team: [name], [name], MD as PCP - General Floretta Mallard, MD as PCP - Cardiology (Cardiology) Abran Norleen SAILOR, MD  as Consulting Physician (Gastroenterology) Gearline Norris, MD as Consulting Physician (Nephrology) Trixie File, MD as Consulting Physician (Internal Medicine) Szabat, Toribio BROCKS, [facility] (Inactive) as Pharmacist (Pharmacist) Tobie Baptist, MD as Consulting Physician (Ophthalmology)  I have personally reviewed and noted the following in the patient's chart:   Medical and social history Use of alcohol, tobacco or illicit drugs  Current medications and supplements including opioid prescriptions. Functional ability and status Nutritional status Physical activity Advanced directives List of other physicians Hospitalizations, surgeries, and ER visits in previous 12 months Vitals Screenings to include cognitive, depression, and falls Referrals and appointments  Orders Placed This Encounter  Procedures   MM 3D SCREENING MAMMOGRAM BILATERAL BREAST    Standing Status:   Future    Expiration Date:   05/18/2025    Reason for Exam (SYMPTOM  OR DIAGNOSIS REQUIRED):   screening for breast cancer    Preferred imaging location?:   [facility]   Hepatitis C antibody    Standing Status:   Future    Expiration Date:   05/18/2025   Ambulatory referral to Gastroenterology    Referral Priority:   Routine    Referral Type:   Consultation    Referral Reason:   Specialty Services Required    Referred to Provider:   Abran Norleen SAILOR, MD    Number of Visits Requested:   1   In addition, I have reviewed and discussed with patient certain preventive protocols, quality metrics, and best practice recommendations. A written personalized care plan for preventive services as well as general preventive health recommendations were provided to patient.   [name], CMA   05/18/2024   Return in 1 year (on 05/18/2025).  After Visit Summary: (MyChart) Due to this being a telephonic visit, the after visit summary with patients personalized plan was offered to patient via MyChart   Nurse Notes: I  have recommended that this patient have a immunization for Influenza, Shingles, and Pneumonia but she declines at this time. I have discussed the risks and benefits of this procedure with her. The patient verbalizes understanding.   Medical screening examination/treatment/procedure(s) were performed by non-physician practitioner and as supervising physician I was immediately available for consultation/collaboration.  I agree with above. Karlynn Noel, MD

## 2024-05-18 NOTE — Patient Instructions (Addendum)
 [name],  Thank you for taking the time for your Medicare Wellness Visit. I appreciate your continued commitment to your health goals. Please review the care plan we discussed, and feel free to reach out if I can assist you further.  Please note that Annual Wellness Visits do not include a physical exam. Some assessments may be limited, especially if the visit was conducted virtually. If needed, we may recommend an in-person follow-up with your provider.  Ongoing Care Seeing your primary care provider every 3 to 6 months helps us  monitor your health and provide consistent, personalized care.   Referrals If a referral was made during today's visit and you haven't received any updates within two weeks, please contact the referred provider directly to check on the status.  Recommended Screenings:  Health Maintenance  Topic Date Due   Hepatitis C Screening  Never done   Colon Cancer Screening  09/30/2022   Yearly kidney function blood test for diabetes  02/12/2024   Breast Cancer Screening  03/11/2024   Yearly kidney health urinalysis for diabetes  08/04/2024*   Zoster (Shingles) Vaccine (1 of 2) 08/18/2024*   DTaP/Tdap/Td vaccine (1 - Tdap) 05/18/2025*   Pneumococcal Vaccine for age over 13 (1 of 2 - PCV) 05/18/2025*   Hemoglobin A1C  10/18/2024   Complete foot exam   11/29/2024   Eye exam for diabetics  01/26/2025   Medicare Annual Wellness Visit  05/18/2025   DEXA scan (bone density measurement)  Completed   Meningitis B Vaccine  Aged Out   Flu Shot  Discontinued   COVID-19 Vaccine  Discontinued  *Topic was postponed. The date shown is not the original due date.       05/18/2024   12:00 PM  Advanced Directives  Does Patient Have a Medical Advance Directive? No  Would patient like information on creating a medical advance directive? No - Patient declined    Vision: Annual vision screenings are recommended for early detection of glaucoma, cataracts, and diabetic  retinopathy. These exams can also reveal signs of chronic conditions such as diabetes and high blood pressure.  Dental: Annual dental screenings help detect early signs of oral cancer, gum disease, and other conditions linked to overall health, including heart disease and diabetes.

## 2024-05-29 ENCOUNTER — Encounter: Payer: Self-pay | Admitting: *Deleted

## 2024-05-29 NOTE — Progress Notes (Signed)
 Morgyn Marut                                          MRN: [id number]   05/29/2024   The [facility] Quality Team Specialist reviewed this patient medical record for the purposes of chart review for care gap closure. The following were reviewed: abstraction for care gap closure-glycemic status assessment.    VBCI Quality Team

## 2024-06-19 ENCOUNTER — Encounter: Payer: Self-pay | Admitting: Podiatry

## 2024-06-19 ENCOUNTER — Ambulatory Visit: Admitting: Podiatry

## 2024-06-19 DIAGNOSIS — M79675 Pain in left toe(s): Secondary | ICD-10-CM

## 2024-06-19 DIAGNOSIS — M79674 Pain in right toe(s): Secondary | ICD-10-CM | POA: Diagnosis not present

## 2024-06-19 DIAGNOSIS — B351 Tinea unguium: Secondary | ICD-10-CM | POA: Diagnosis not present

## 2024-06-19 DIAGNOSIS — E1142 Type 2 diabetes mellitus with diabetic polyneuropathy: Secondary | ICD-10-CM

## 2024-06-19 NOTE — Progress Notes (Signed)
°  Subjective:  Patient ID: [name], female    DOB: Sep 06, 1950,   MRN: [id number]  Chief Complaint  Patient presents with   Diabetes    Cut these toenails.  I have an ingrown toenail on my left big toe that's been hurting for about a month. Saw Dr. Lela Fendt - 04/19/2024; A1c - 6.6    73 y.o. female presents for concern of thickened elongated and painful nails that are difficult to trim. Requesting to have them trimmed today. Relates burning and tingling in their feet. Patient is diabetic and last A1c was  Lab Results  Component Value Date   HGBA1C 6.6 (A) 04/19/2024   .  Relates pain in her feet and wondering about diabetic shoes  PCP:  [name], [name], MD    . Denies any other pedal complaints. Denies n/v/f/c.   Past Medical History:  Diagnosis Date   Anemia    COPD (chronic obstructive pulmonary disease) (HCC)    Diabetes mellitus    Hyperlipidemia    Hypertension    Ischemic cardiomyopathy 06/11/2013   Low back pain    NSTEMI (non-ST elevated myocardial infarction) (HCC) 06/11/2013   Obesity    Personal history of colon cancer    Renal insufficiency     Objective:  Physical Exam: Vascular: DP/PT pulses 2/4 bilateral. CFT <3 seconds. Absent hair growth on digits. Edema noted to bilateral lower extremities. Xerosis noted bilaterally.  Skin. No lacerations or abrasions bilateral feet. Nails 1-5 bilateral  are thickened discolored and elongated with subungual debris.  Musculoskeletal: MMT 5/5 bilateral lower extremities in DF, PF, Inversion and Eversion. Deceased ROM in DF of ankle joint. Pes planus noted bilateral with diminished fat pad to ball and heel of foot bilateral.  Neurological: Sensation intact to light touch. Protective sensation diminished bilateral.    Assessment:   1. Pain due to onychomycosis of toenails of both feet   2. Type 2 diabetes mellitus with peripheral neuropathy (HCC)       Plan:  Patient was evaluated and treated  and all questions answered. -Discussed and educated patient on diabetic foot care, especially with  regards to the vascular, neurological and musculoskeletal systems.  -Stressed the importance of good glycemic control and the detriment of not  controlling glucose levels in relation to the foot. -Discussed supportive shoes at all times and checking feet regularly.  -Mechanically debrided all nails 1-5 bilateral using sterile nail nipper and filed with dremel without incident  -Answered all patient questions -Patient to return  in 3 months for at risk foot care -Patient advised to call the office if any problems or questions arise in the meantime.   Asberry Failing, DPM

## 2024-06-20 ENCOUNTER — Ambulatory Visit: Admission: RE | Admit: 2024-06-20 | Source: Ambulatory Visit

## 2024-06-20 DIAGNOSIS — Z1231 Encounter for screening mammogram for malignant neoplasm of breast: Secondary | ICD-10-CM

## 2024-07-03 ENCOUNTER — Other Ambulatory Visit: Payer: Self-pay

## 2024-07-03 ENCOUNTER — Ambulatory Visit: Payer: Self-pay | Admitting: Internal Medicine

## 2024-07-03 MED ORDER — NEBIVOLOL HCL 10 MG PO TABS: 10.0000 mg | ORAL_TABLET | Freq: Every day | ORAL | 3 refills | Status: AC

## 2024-07-03 NOTE — Telephone Encounter (Signed)
" °  FYI Only or Action Required?: Action required by provider: medication refill request and appointment scheduled on 12.30.25.  Patient was last seen in primary care on 03/31/2024 by [name], [name], MD.  Called Nurse Triage reporting Leg Swelling.  Symptoms began a week ago.  Interventions attempted: Nothing.  Symptoms are: gradually worsening.  Triage Disposition: See Physician Within 24 Hours  Patient/caregiver understands and will follow disposition?: Yes   Copied from CRM #[id number]. Topic: Clinical - Red Word Triage >> Jul 03, 2024 12:00 PM Mesmerise C wrote: [facility] that prompted transfer to Nurse Triage: Patient stated she's been out of her nebivolol  (BYSTOLIC ) 10 MG tablet and her leg is swollen due to not having it states the pharmacy never received it on 12/1 Reason for Disposition  [1] MODERATE leg swelling (e.g., swelling extends up to knees) AND [2] new-onset or getting worse  Answer Assessment - Initial Assessment Questions 1. ONSET: When did the swelling start? (e.g., minutes, hours, days)     1 weeks  2. LOCATION: What part of the leg is swollen?  Are both legs swollen or just one leg?     Left leg- knee to toes  3. SEVERITY: How bad is the swelling? (e.g., localized; mild, moderate, severe)      Still able to get around  4. REDNESS: Is there redness or signs of infection?      denies  5. PAIN: Is the swelling painful to touch? If Yes, ask: How painful is it?   (Scale 1-10; mild, moderate or severe) 8/10       6. FEVER: Do you have a fever? If Yes, ask: What is it, how was it measured, and when did it start?       denies  7. CAUSE: What do you think is causing the leg swelling?      Out of blood pressure medication  8. MEDICAL HISTORY: Do you have a history of blood clots (e.g., DVT), cancer, heart failure, kidney disease, or liver failure?        9. RECURRENT SYMPTOM: Have you had leg swelling before? If Yes, ask: When was  the last time? What happened that time?      Denies symptoms this bad  10. OTHER SYMPTOMS: Pt denies chest pain, difficulty breathing           Pt reports left leg swelling due to being out of blood pressure medication Pt scheduled for a visit on  12.30.25  for further evaluation. Routing to clinic to assist with medication refill prior to patient's appt in clinic to have leg evaluated Pt agrees with plan of care, will call back for any worsening symptoms  Protocols used: Leg Swelling and Edema-A-AH  "

## 2024-07-03 NOTE — Telephone Encounter (Signed)
 Pts medication was re-sent to [facility]

## 2024-07-04 ENCOUNTER — Ambulatory Visit: Admitting: Internal Medicine

## 2024-07-11 ENCOUNTER — Ambulatory Visit: Admitting: Internal Medicine

## 2024-07-11 ENCOUNTER — Encounter: Payer: Self-pay | Admitting: Internal Medicine

## 2024-07-11 VITALS — BP 124/78 | HR 70 | Temp 98.6°F | Ht 62.0 in | Wt 194.0 lb

## 2024-07-11 DIAGNOSIS — E1159 Type 2 diabetes mellitus with other circulatory complications: Secondary | ICD-10-CM

## 2024-07-11 DIAGNOSIS — J449 Chronic obstructive pulmonary disease, unspecified: Secondary | ICD-10-CM | POA: Diagnosis not present

## 2024-07-11 DIAGNOSIS — E1122 Type 2 diabetes mellitus with diabetic chronic kidney disease: Secondary | ICD-10-CM

## 2024-07-11 DIAGNOSIS — E1165 Type 2 diabetes mellitus with hyperglycemia: Secondary | ICD-10-CM | POA: Diagnosis not present

## 2024-07-11 DIAGNOSIS — I110 Hypertensive heart disease with heart failure: Secondary | ICD-10-CM

## 2024-07-11 DIAGNOSIS — N184 Chronic kidney disease, stage 4 (severe): Secondary | ICD-10-CM

## 2024-07-11 DIAGNOSIS — I5042 Chronic combined systolic (congestive) and diastolic (congestive) heart failure: Secondary | ICD-10-CM

## 2024-07-11 MED ORDER — TORSEMIDE 20 MG PO TABS: 40.0000 mg | ORAL_TABLET | Freq: Every day | ORAL | 1 refills | Status: AC

## 2024-07-11 NOTE — Assessment & Plan Note (Signed)
Stable overall, cont current in haler prn °

## 2024-07-11 NOTE — Assessment & Plan Note (Signed)
 BP back under control with pt back on Htn meds, but has persistent leg swelling  - ok for increased torsemide  40 mg every day (20 mg x 2 tabs).  Pt also has f/u with renal and endocrinology in 2 weeks, as well as PCP f/u in Mar 2026. Pt often has labs done per renal as well.

## 2024-07-11 NOTE — Assessment & Plan Note (Signed)
 Pt for lab and f/u renal visit in approx 2 wks  Lab Results  Component Value Date   CREATININE 2.88 (H) 02/12/2023   Stable overall, cont to avoid nephrotoxins

## 2024-07-11 NOTE — Progress Notes (Signed)
 Patient ID: [name], female   DOB: [birth year], 74 y.o.   MRN: [id number]        Chief Complaint: follow up bilateral leg swelling left > right       HPI:  [name] is a 74 y.o. female here with hx of CHF due to hypertensive heart disease, states she was out of all BP meds for over 1 month, but then restarted in the past wk, including the torsemide  30 mg per day (10 mg x 3 tabs) as per cardiology since 2023.  Swelling has improved this past wk, but remains increased, wt is increased, despite compression stockings bilateral, leg elevation, low salt diet.  Pt denies chest pain, increased sob or doe, wheezing, orthopnea, PND, palpitations, dizziness or syncope.  Pt denies polydipsia, polyuria, or new focal neuro s/s.          Wt Readings from Last 3 Encounters:  07/11/24 194 lb (88 kg)  05/18/24 186 lb (84.4 kg)  04/19/24 186 lb 9.6 oz (84.6 kg)   BP Readings from Last 3 Encounters:  07/11/24 124/78  04/19/24 120/70  03/31/24 136/70         Past Medical History:  Diagnosis Date   Anemia    COPD (chronic obstructive pulmonary disease) (HCC)    Diabetes mellitus    Hyperlipidemia    Hypertension    Ischemic cardiomyopathy 06/11/2013   Low back pain    NSTEMI (non-ST elevated myocardial infarction) (HCC) 06/11/2013   Obesity    Personal history of colon cancer    Renal insufficiency    Past Surgical History:  Procedure Laterality Date   COLECTOMY     INCISION AND DRAINAGE PERIRECTAL ABSCESS N/A 03/07/2020   Procedure: IRRIGATION AND DEBRIDEMENT PERINEUM  AND PANNUS ABSCESS;  Surgeon: Vanderbilt Ned, MD;  Location: [facility] OR;  Service: General;  Laterality: N/A;   IR RADIOLOGIST EVAL & MGMT  08/06/2020   LEFT HEART CATHETERIZATION WITH CORONARY ANGIOGRAM N/A 06/09/2013   Procedure: LEFT HEART CATHETERIZATION WITH CORONARY ANGIOGRAM;  Surgeon: Mickey Aleene JINNY Alveta, MD;  Location: [facility] CATH LAB;  Service: Cardiovascular;  Laterality: N/A;    reports that she has been  smoking cigarettes. She has never used smokeless tobacco. She reports that she does not drink alcohol and does not use drugs. family history includes Hypertension in an other family member. Allergies[1] Medications Ordered Prior to Encounter[2]      ROS:  All others reviewed and negative.  Objective        PE:  BP 124/78 (BP Location: Right Arm, Patient Position: Sitting, Cuff Size: Normal)   Pulse 70   Temp 98.6 F (37 C) (Oral)   Ht 5' 2 (1.575 m)   Wt 194 lb (88 kg)   LMP  (LMP Unknown)   SpO2 98%   BMI 35.48 kg/m                 Constitutional: Pt appears in NAD               HENT: Head: NCAT.                Right Ear: External ear normal.                 Left Ear: External ear normal.                Eyes: . Pupils are equal, round, and reactive to light. Conjunctivae and EOM are normal  Nose: without d/c or deformity               Neck: Neck supple. Gross normal ROM               Cardiovascular: Normal rate and regular rhythm.                 Pulmonary/Chest: Effort normal and breath sounds without rales or wheezing.                Abd:  Soft, NT, ND, + BS, no organomegaly               Neurological: Pt is alert. At baseline orientation, motor grossly intact               Skin: Skin is warm. No rashes, no other new lesions, LE edema - 1-2+ bilateral leg edema to knees left > right, no ulcers or weeping               Psychiatric: Pt behavior is normal without agitation   Micro: none  Cardiac tracings I have personally interpreted today:  none  Pertinent Radiological findings (summarize): none   Lab Results  Component Value Date   WBC 6.4 02/12/2023   HGB 10.0 (L) 02/12/2023   HCT 33.0 (L) 02/12/2023   PLT 131 (L) 02/12/2023   GLUCOSE 127 (H) 02/12/2023   CHOL 129 12/16/2021   TRIG 78 12/16/2021   HDL 49 12/16/2021   LDLCALC 65 12/16/2021   ALT 11 12/16/2021   AST 14 07/17/2021   NA 139 02/12/2023   K 4.0 02/12/2023   CL 108 02/12/2023    CREATININE 2.88 (H) 02/12/2023   BUN 89 (H) 02/12/2023   CO2 18 (L) 02/12/2023   TSH 1.51 07/17/2021   INR 1.1 06/17/2020   HGBA1C 6.6 (A) 04/19/2024   MICROALBUR 13.9 (H) 10/13/2007   Assessment/Plan:  [name] is a 74 y.o. Black or African American [2] female with  has a past medical history of Anemia, COPD (chronic obstructive pulmonary disease) (HCC), Diabetes mellitus, Hyperlipidemia, Hypertension, Ischemic cardiomyopathy (06/11/2013), Low back pain, NSTEMI (non-ST elevated myocardial infarction) (HCC) (06/11/2013), Obesity, Personal history of colon cancer, and Renal insufficiency.  Hypertensive heart disease with congestive heart failure (HCC) BP back under control with pt back on Htn meds, but has persistent leg swelling  - ok for increased torsemide  40 mg every day (20 mg x 2 tabs).  Pt also has f/u with renal and endocrinology in 2 weeks, as well as PCP f/u in Mar 2026. Pt often has labs done per renal as well.    COPD mixed type (HCC) Stable overall, cont current inhaler prn  CKD stage 4 due to type 2 diabetes mellitus (HCC) Pt for lab and f/u renal visit in approx 2 wks  Lab Results  Component Value Date   CREATININE 2.88 (H) 02/12/2023   Stable overall, cont to avoid nephrotoxins  Followup: Return if symptoms worsen or fail to improve.  Lynwood Rush, MD 07/11/2024 12:39 PM [facility] Medicine     [1]  Allergies Allergen Reactions   Tradjenta  [Linagliptin ] Anaphylaxis    CP   Albuterol      Palpitations   Atorvastatin  Other (See Comments)    Patient unsure of allergy.   Biobrane Gloves Large [Wound Dressings]    Enalapril Maleate     REACTION: cough   Farxiga  [Dapagliflozin ] Itching   Hydrochlorothiazide  REACTION: hair loss   Kenalog  [Triamcinolone  Acetonide]     HANDS NUMB    Levaquin [Levofloxacin] Hives   Lisinopril    Metformin  And Related     Diarrhea, dizziness    Propoxyphene N-Acetaminophen  Hives and Other (See Comments)    Patient unsure of allergy.   Simvastatin     REACTION: cramps   Spironolactone     REACTION: cramps  [2]  Current Outpatient Medications on File Prior to Visit  Medication Sig Dispense Refill   acetaminophen  (TYLENOL ) 325 MG tablet Take 325 mg by mouth every 6 (six) hours as needed.     allopurinol (ZYLOPRIM) 100 MG tablet Take 100 mg by mouth daily.     amLODipine  (NORVASC ) 10 MG tablet TAKE ONE-HALF TABLET BY MOUTH  DAILY 50 tablet 2   Ascorbic Acid (VITAMIN C) 1000 MG tablet Take 500 mg by mouth daily.     calcium  carbonate (OS-CAL) 600 MG TABS tablet Take 600 mg by mouth 2 (two) times daily with a meal.     Cholecalciferol (VITAMIN D3) 50 MCG (2000 UT) capsule Take 1 capsule (2,000 Units total) by mouth daily. 100 capsule 3   ferrous sulfate  325 (65 FE) MG tablet Take 325 mg by mouth 2 (two) times daily with a meal.     glipiZIDE  (GLUCOTROL  XL) 5 MG 24 hr tablet TAKE 1 TABLET BY MOUTH DAILY  WITH BREAKFAST 100 tablet 2   hydrALAZINE  (APRESOLINE ) 25 MG tablet Take 1 tablet (25 mg total) by mouth 3 (three) times daily. 270 tablet 3   HYDROcodone -acetaminophen  (NORCO) 5-325 MG tablet Take 1 tablet by mouth every 6 (six) hours as needed for moderate pain (pain score 4-6). 60 tablet 0   lansoprazole  (PREVACID ) 15 MG capsule Take 1 capsule (15 mg total) by mouth daily at 12 noon. 90 capsule 3   mupirocin  ointment (BACTROBAN ) 2 % On leg wound w/dressing change qd or bid 30 g 0   nebivolol  (BYSTOLIC ) 10 MG tablet Take 1 tablet (10 mg total) by mouth daily. 90 tablet 3   nitroGLYCERIN  (NITROSTAT ) 0.4 MG SL tablet Place 1 tablet (0.4 mg total) under the tongue every 5 (five) minutes as needed for chest pain. 20 tablet 3   ondansetron  (ZOFRAN ) 4 MG tablet Take 1 tablet (4 mg total) by mouth every 8 (eight) hours as needed for nausea or vomiting. 20 tablet 0   ONETOUCH ULTRA test strip USE TO TEST BLOOD SUGAR TWICE  DAILY AS DIRECTED 200  strip 2   potassium chloride  SA (KLOR-CON  M) 20 MEQ tablet TAKE 1 TABLET BY MOUTH DAILY 100 tablet 2   rosuvastatin  (CRESTOR ) 5 MG tablet TAKE 1 TABLET (5 MG TOTAL) BY MOUTH DAILY AT 6PM 90 tablet 2   triamcinolone  cream (KENALOG ) 0.1 % Apply 1 Application topically 3 (three) times daily. On rash 160 g 3   vitamin B-12 (CYANOCOBALAMIN) 1000 MCG tablet Take 1,000 mcg by mouth daily.     No current facility-administered medications on file prior to visit.

## 2024-07-11 NOTE — Patient Instructions (Signed)
 Ok to increase the demadex  to 20 mg pills with 2 in the AM (for total 40 mg)  Please continue all other medications as before, and refills have been done if requested.  Please have the pharmacy call with any other refills you may need.  Please keep your appointments with your specialists as you may have planned - Renal and Endo later this month

## 2024-07-28 LAB — LAB REPORT - SCANNED
Albumin, Urine POC: 42.7
Albumin/Creatinine Ratio, Urine, POC: 100
Creatinine, POC: 42.8 mg/dL
EGFR: 17

## 2024-09-19 ENCOUNTER — Ambulatory Visit: Admitting: Podiatry

## 2024-09-28 ENCOUNTER — Ambulatory Visit: Admitting: Internal Medicine

## 2024-10-19 ENCOUNTER — Ambulatory Visit: Admitting: Internal Medicine

## 2025-05-29 ENCOUNTER — Encounter: Admitting: Internal Medicine

## 2025-05-29 ENCOUNTER — Ambulatory Visit
